# Patient Record
Sex: Female | Born: 1945 | Race: White | Hispanic: No | Marital: Married | Smoking: Former smoker
Health system: Southern US, Community
[De-identification: ages and names within clinical notes are randomized; demographics above are authoritative.]

## PROBLEM LIST (undated history)

## (undated) DIAGNOSIS — K219 Gastro-esophageal reflux disease without esophagitis: Secondary | ICD-10-CM

## (undated) DIAGNOSIS — M1990 Unspecified osteoarthritis, unspecified site: Secondary | ICD-10-CM

## (undated) DIAGNOSIS — E039 Hypothyroidism, unspecified: Secondary | ICD-10-CM

## (undated) DIAGNOSIS — K8050 Calculus of bile duct without cholangitis or cholecystitis without obstruction: Secondary | ICD-10-CM

## (undated) DIAGNOSIS — I10 Essential (primary) hypertension: Secondary | ICD-10-CM

## (undated) DIAGNOSIS — A048 Other specified bacterial intestinal infections: Secondary | ICD-10-CM

## (undated) DIAGNOSIS — D649 Anemia, unspecified: Secondary | ICD-10-CM

## (undated) DIAGNOSIS — H269 Unspecified cataract: Secondary | ICD-10-CM

## (undated) DIAGNOSIS — E785 Hyperlipidemia, unspecified: Secondary | ICD-10-CM

## (undated) DIAGNOSIS — M069 Rheumatoid arthritis, unspecified: Secondary | ICD-10-CM

## (undated) DIAGNOSIS — K279 Peptic ulcer, site unspecified, unspecified as acute or chronic, without hemorrhage or perforation: Secondary | ICD-10-CM

## (undated) DIAGNOSIS — R011 Cardiac murmur, unspecified: Secondary | ICD-10-CM

## (undated) HISTORY — DX: Peptic ulcer, site unspecified, unspecified as acute or chronic, without hemorrhage or perforation: K27.9

## (undated) HISTORY — DX: Other specified bacterial intestinal infections: A04.8

## (undated) HISTORY — PX: BREAST CYST EXCISION: SHX579

## (undated) HISTORY — PX: CARPAL TUNNEL RELEASE: SHX101

## (undated) HISTORY — PX: TRIGGER FINGER RELEASE: SHX641

## (undated) HISTORY — DX: Hypothyroidism, unspecified: E03.9

## (undated) HISTORY — DX: Anemia, unspecified: D64.9

## (undated) HISTORY — PX: PILONIDAL CYST EXCISION: SHX744

## (undated) HISTORY — PX: AV FISTULA REPAIR: SHX563

## (undated) HISTORY — DX: Unspecified cataract: H26.9

## (undated) HISTORY — PX: CYSTECTOMY: SUR359

## (undated) HISTORY — DX: Calculus of bile duct without cholangitis or cholecystitis without obstruction: K80.50

## (undated) HISTORY — DX: Rheumatoid arthritis, unspecified: M06.9

## (undated) HISTORY — DX: Gastro-esophageal reflux disease without esophagitis: K21.9

## (undated) HISTORY — DX: Hyperlipidemia, unspecified: E78.5

## (undated) HISTORY — PX: BLADDER SUSPENSION: SHX72

## (undated) HISTORY — PX: BUNIONECTOMY: SHX129

## (undated) HISTORY — DX: Unspecified osteoarthritis, unspecified site: M19.90

## (undated) HISTORY — PX: VAGINAL HYSTERECTOMY: SUR661

## (undated) HISTORY — DX: Cardiac murmur, unspecified: R01.1

## (undated) HISTORY — PX: TONSILLECTOMY: SUR1361

---

## 1998-11-14 ENCOUNTER — Encounter: Payer: Self-pay | Admitting: Internal Medicine

## 1998-11-14 ENCOUNTER — Ambulatory Visit (HOSPITAL_COMMUNITY): Admission: RE | Admit: 1998-11-14 | Discharge: 1998-11-14 | Payer: Self-pay | Admitting: Internal Medicine

## 1999-12-21 ENCOUNTER — Encounter: Payer: Self-pay | Admitting: Internal Medicine

## 1999-12-21 ENCOUNTER — Ambulatory Visit (HOSPITAL_COMMUNITY): Admission: RE | Admit: 1999-12-21 | Discharge: 1999-12-21 | Payer: Self-pay | Admitting: Internal Medicine

## 2000-03-25 ENCOUNTER — Encounter (INDEPENDENT_AMBULATORY_CARE_PROVIDER_SITE_OTHER): Payer: Self-pay | Admitting: Specialist

## 2000-03-25 ENCOUNTER — Ambulatory Visit (HOSPITAL_BASED_OUTPATIENT_CLINIC_OR_DEPARTMENT_OTHER): Admission: RE | Admit: 2000-03-25 | Discharge: 2000-03-25 | Payer: Self-pay | Admitting: Orthopedic Surgery

## 2000-12-23 ENCOUNTER — Ambulatory Visit (HOSPITAL_COMMUNITY): Admission: RE | Admit: 2000-12-23 | Discharge: 2000-12-23 | Payer: Self-pay | Admitting: Internal Medicine

## 2000-12-23 ENCOUNTER — Encounter: Payer: Self-pay | Admitting: Internal Medicine

## 2001-05-31 ENCOUNTER — Encounter: Payer: Self-pay | Admitting: Gastroenterology

## 2001-05-31 ENCOUNTER — Other Ambulatory Visit: Admission: RE | Admit: 2001-05-31 | Discharge: 2001-05-31 | Payer: Self-pay | Admitting: Gastroenterology

## 2001-05-31 ENCOUNTER — Encounter (INDEPENDENT_AMBULATORY_CARE_PROVIDER_SITE_OTHER): Payer: Self-pay | Admitting: Specialist

## 2001-09-26 ENCOUNTER — Encounter: Payer: Self-pay | Admitting: Internal Medicine

## 2001-09-26 ENCOUNTER — Ambulatory Visit (HOSPITAL_COMMUNITY): Admission: RE | Admit: 2001-09-26 | Discharge: 2001-09-26 | Payer: Self-pay | Admitting: Internal Medicine

## 2002-01-09 ENCOUNTER — Ambulatory Visit (HOSPITAL_COMMUNITY): Admission: RE | Admit: 2002-01-09 | Discharge: 2002-01-09 | Payer: Self-pay | Admitting: Internal Medicine

## 2002-01-09 ENCOUNTER — Encounter: Payer: Self-pay | Admitting: Internal Medicine

## 2002-05-03 ENCOUNTER — Encounter: Payer: Self-pay | Admitting: Urology

## 2002-05-07 ENCOUNTER — Observation Stay (HOSPITAL_COMMUNITY): Admission: RE | Admit: 2002-05-07 | Discharge: 2002-05-08 | Payer: Self-pay | Admitting: Urology

## 2003-07-19 ENCOUNTER — Encounter: Payer: Self-pay | Admitting: Internal Medicine

## 2003-07-19 ENCOUNTER — Ambulatory Visit (HOSPITAL_COMMUNITY): Admission: RE | Admit: 2003-07-19 | Discharge: 2003-07-19 | Payer: Self-pay | Admitting: Internal Medicine

## 2003-11-01 ENCOUNTER — Emergency Department (HOSPITAL_COMMUNITY): Admission: AD | Admit: 2003-11-01 | Discharge: 2003-11-01 | Payer: Self-pay | Admitting: Family Medicine

## 2003-11-08 ENCOUNTER — Emergency Department (HOSPITAL_COMMUNITY): Admission: AD | Admit: 2003-11-08 | Discharge: 2003-11-08 | Payer: Self-pay | Admitting: Family Medicine

## 2004-07-02 ENCOUNTER — Ambulatory Visit (HOSPITAL_BASED_OUTPATIENT_CLINIC_OR_DEPARTMENT_OTHER): Admission: RE | Admit: 2004-07-02 | Discharge: 2004-07-02 | Payer: Self-pay | Admitting: Urology

## 2004-07-02 ENCOUNTER — Ambulatory Visit (HOSPITAL_COMMUNITY): Admission: RE | Admit: 2004-07-02 | Discharge: 2004-07-02 | Payer: Self-pay | Admitting: Urology

## 2004-08-04 ENCOUNTER — Ambulatory Visit (HOSPITAL_COMMUNITY): Admission: RE | Admit: 2004-08-04 | Discharge: 2004-08-04 | Payer: Self-pay | Admitting: Internal Medicine

## 2004-12-18 ENCOUNTER — Encounter (INDEPENDENT_AMBULATORY_CARE_PROVIDER_SITE_OTHER): Payer: Self-pay | Admitting: *Deleted

## 2004-12-18 ENCOUNTER — Ambulatory Visit (HOSPITAL_BASED_OUTPATIENT_CLINIC_OR_DEPARTMENT_OTHER): Admission: RE | Admit: 2004-12-18 | Discharge: 2004-12-18 | Payer: Self-pay | Admitting: Orthopedic Surgery

## 2004-12-18 ENCOUNTER — Ambulatory Visit (HOSPITAL_COMMUNITY): Admission: RE | Admit: 2004-12-18 | Discharge: 2004-12-18 | Payer: Self-pay | Admitting: Orthopedic Surgery

## 2005-01-26 ENCOUNTER — Ambulatory Visit: Payer: Self-pay | Admitting: Gastroenterology

## 2005-02-10 ENCOUNTER — Ambulatory Visit: Payer: Self-pay | Admitting: Gastroenterology

## 2005-02-21 ENCOUNTER — Emergency Department (HOSPITAL_COMMUNITY): Admission: AD | Admit: 2005-02-21 | Discharge: 2005-02-21 | Payer: Self-pay | Admitting: Family Medicine

## 2005-02-21 ENCOUNTER — Ambulatory Visit (HOSPITAL_COMMUNITY): Admission: RE | Admit: 2005-02-21 | Discharge: 2005-02-21 | Payer: Self-pay | Admitting: Family Medicine

## 2005-03-19 ENCOUNTER — Ambulatory Visit: Payer: Self-pay | Admitting: Gastroenterology

## 2005-05-06 ENCOUNTER — Ambulatory Visit: Payer: Self-pay | Admitting: Gastroenterology

## 2005-08-26 ENCOUNTER — Ambulatory Visit (HOSPITAL_COMMUNITY): Admission: RE | Admit: 2005-08-26 | Discharge: 2005-08-26 | Payer: Self-pay | Admitting: Gynecology

## 2005-09-20 ENCOUNTER — Emergency Department (HOSPITAL_COMMUNITY): Admission: EM | Admit: 2005-09-20 | Discharge: 2005-09-20 | Payer: Self-pay | Admitting: Emergency Medicine

## 2006-10-28 ENCOUNTER — Ambulatory Visit (HOSPITAL_COMMUNITY): Admission: RE | Admit: 2006-10-28 | Discharge: 2006-10-28 | Payer: Self-pay | Admitting: Internal Medicine

## 2007-11-14 ENCOUNTER — Ambulatory Visit (HOSPITAL_COMMUNITY): Admission: RE | Admit: 2007-11-14 | Discharge: 2007-11-14 | Payer: Self-pay | Admitting: Internal Medicine

## 2008-09-18 ENCOUNTER — Encounter: Payer: Self-pay | Admitting: Gastroenterology

## 2008-09-24 ENCOUNTER — Encounter: Payer: Self-pay | Admitting: Gastroenterology

## 2008-09-25 ENCOUNTER — Encounter: Payer: Self-pay | Admitting: Gastroenterology

## 2008-10-21 DIAGNOSIS — M069 Rheumatoid arthritis, unspecified: Secondary | ICD-10-CM

## 2008-10-21 DIAGNOSIS — I10 Essential (primary) hypertension: Secondary | ICD-10-CM

## 2008-10-21 DIAGNOSIS — Z8711 Personal history of peptic ulcer disease: Secondary | ICD-10-CM

## 2008-10-21 DIAGNOSIS — K253 Acute gastric ulcer without hemorrhage or perforation: Secondary | ICD-10-CM | POA: Insufficient documentation

## 2008-10-28 ENCOUNTER — Ambulatory Visit: Payer: Self-pay | Admitting: Gastroenterology

## 2008-11-12 ENCOUNTER — Encounter: Payer: Self-pay | Admitting: Gastroenterology

## 2008-11-12 ENCOUNTER — Ambulatory Visit: Payer: Self-pay | Admitting: Gastroenterology

## 2008-11-15 ENCOUNTER — Ambulatory Visit (HOSPITAL_COMMUNITY): Admission: RE | Admit: 2008-11-15 | Discharge: 2008-11-15 | Payer: Self-pay | Admitting: Internal Medicine

## 2008-11-15 ENCOUNTER — Telehealth: Payer: Self-pay | Admitting: Gastroenterology

## 2008-11-15 ENCOUNTER — Encounter: Payer: Self-pay | Admitting: Gastroenterology

## 2008-11-19 ENCOUNTER — Ambulatory Visit (HOSPITAL_BASED_OUTPATIENT_CLINIC_OR_DEPARTMENT_OTHER): Admission: RE | Admit: 2008-11-19 | Discharge: 2008-11-19 | Payer: Self-pay | Admitting: Orthopedic Surgery

## 2008-12-09 ENCOUNTER — Ambulatory Visit: Payer: Self-pay | Admitting: Gastroenterology

## 2008-12-09 LAB — CONVERTED CEMR LAB
Basophils Absolute: 0.1 10*3/uL (ref 0.0–0.1)
Basophils Relative: 1.5 % (ref 0.0–3.0)
Eosinophils Absolute: 0.1 10*3/uL (ref 0.0–0.7)
Eosinophils Relative: 3.5 % (ref 0.0–5.0)
HCT: 35.4 % — ABNORMAL LOW (ref 36.0–46.0)
Hemoglobin: 12.2 g/dL (ref 12.0–15.0)
Lymphocytes Relative: 31.6 % (ref 12.0–46.0)
MCHC: 34.5 g/dL (ref 30.0–36.0)
MCV: 88.8 fL (ref 78.0–100.0)
Monocytes Absolute: 0.4 10*3/uL (ref 0.1–1.0)
Monocytes Relative: 8.9 % (ref 3.0–12.0)
Neutro Abs: 2.2 10*3/uL (ref 1.4–7.7)
Neutrophils Relative %: 54.5 % (ref 43.0–77.0)
Platelets: 162 10*3/uL (ref 150–400)
RBC: 3.99 M/uL (ref 3.87–5.11)
RDW: 14.1 % (ref 11.5–14.6)
WBC: 4.1 10*3/uL — ABNORMAL LOW (ref 4.5–10.5)

## 2008-12-11 ENCOUNTER — Ambulatory Visit: Payer: Self-pay | Admitting: Gastroenterology

## 2009-03-17 ENCOUNTER — Encounter: Payer: Self-pay | Admitting: Gastroenterology

## 2009-09-24 ENCOUNTER — Ambulatory Visit (HOSPITAL_COMMUNITY): Admission: RE | Admit: 2009-09-24 | Discharge: 2009-09-24 | Payer: Self-pay | Admitting: Internal Medicine

## 2009-11-19 ENCOUNTER — Ambulatory Visit (HOSPITAL_COMMUNITY): Admission: RE | Admit: 2009-11-19 | Discharge: 2009-11-19 | Payer: Self-pay | Admitting: Internal Medicine

## 2010-08-18 ENCOUNTER — Encounter: Payer: Self-pay | Admitting: Gastroenterology

## 2010-09-30 ENCOUNTER — Ambulatory Visit: Payer: Self-pay | Admitting: Gastroenterology

## 2010-11-11 ENCOUNTER — Ambulatory Visit
Admission: RE | Admit: 2010-11-11 | Discharge: 2010-11-11 | Payer: Self-pay | Source: Home / Self Care | Attending: Gastroenterology | Admitting: Gastroenterology

## 2010-11-11 ENCOUNTER — Encounter: Payer: Self-pay | Admitting: Gastroenterology

## 2010-11-12 ENCOUNTER — Encounter: Payer: Self-pay | Admitting: Gastroenterology

## 2010-11-24 ENCOUNTER — Encounter: Payer: Self-pay | Admitting: Gastroenterology

## 2010-11-24 ENCOUNTER — Other Ambulatory Visit: Payer: Self-pay | Admitting: Gastroenterology

## 2010-11-24 ENCOUNTER — Ambulatory Visit
Admission: RE | Admit: 2010-11-24 | Discharge: 2010-11-24 | Payer: Self-pay | Source: Home / Self Care | Attending: Gastroenterology | Admitting: Gastroenterology

## 2010-11-24 ENCOUNTER — Ambulatory Visit (HOSPITAL_COMMUNITY)
Admission: RE | Admit: 2010-11-24 | Discharge: 2010-11-24 | Payer: Self-pay | Source: Home / Self Care | Attending: Internal Medicine | Admitting: Internal Medicine

## 2010-11-24 LAB — HEMOCCULT SLIDES (X 3 CARDS)
Fecal Occult Blood: NEGATIVE
OCCULT 1: NEGATIVE
OCCULT 2: NEGATIVE
OCCULT 3: NEGATIVE
OCCULT 4: NEGATIVE
OCCULT 5: NEGATIVE

## 2010-11-26 ENCOUNTER — Ambulatory Visit: Admit: 2010-11-26 | Payer: Self-pay | Admitting: Gastroenterology

## 2010-11-28 ENCOUNTER — Encounter: Payer: Self-pay | Admitting: Internal Medicine

## 2010-11-29 ENCOUNTER — Encounter: Payer: Self-pay | Admitting: Internal Medicine

## 2010-12-10 NOTE — Assessment & Plan Note (Signed)
Summary: LOW IRON/YF   History of Present Illness Visit Type: Follow-up Visit Primary GI MD: Melvia Heaps MD [facility] Primary Provider: Lucky Cowboy, MD Requesting Provider: Lucky Cowboy, MD Chief Complaint: iron def anemia History of Present Illness:   Ms.  Vazquez has returned for reevaluation of an iron deficiency anemia.  She was last seen in February, 2010 where ulcers were seen at endoscopy.  She had been taking ibuprofen regularly for her rheumatoid arthritis.  She was referred back for reevaluation.  Recent hemoglobin was 13.3.  Ferritin level was 40.  She takes folate and a multivitamin presumably containing iron .  She is taking ibuprofen 800 mg 2-4 times a day and prilosec concurrently.  She denies abdominal pain, melena or hematochezia.  Last colonoscopy was in 2002 and was normal.   GI Review of Systems    Reports abdominal pain and  acid reflux.     Location of  Abdominal pain: epigastric area.    Denies belching, bloating, chest pain, dysphagia with liquids, dysphagia with solids, heartburn, loss of appetite, nausea, vomiting, vomiting blood, weight loss, and  weight gain.        Denies anal fissure, black tarry stools, change in bowel habit, constipation, diarrhea, diverticulosis, fecal incontinence, heme positive stool, hemorrhoids, irritable bowel syndrome, jaundice, light color stool, liver problems, rectal bleeding, and  rectal pain.    Current Medications (verified): 1)  Plaquenil 200 Mg Tabs (Hydroxychloroquine Sulfate) .Marland Kitchen.. 1 Tablet By Mouth Once Daily 2)  Methotrexate 2.5 Mg Tabs (Methotrexate Sodium) .... 4 Tablets By Mouth Weekly 3)  Folic Acid 1 Mg Tabs (Folic Acid) .Marland Kitchen.. 1 Tablet By Mouth Once Daily 4)  Synthroid 175 Mcg Tabs (Levothyroxine Sodium) .Marland Kitchen.. 1 Tablet By Mouth Once Daily 5)  Alprazolam 0.5 Mg Tabs (Alprazolam) .Marland Kitchen.. 1 Tablet By Mouth At Bedtime As Needed Sleep 6)  Ibuprofen 800 Mg Tabs (Ibuprofen) .... 2-4 Tablets By Mouth Once Daily 7)   Multivitamins  Tabs (Multiple Vitamin) .Marland Kitchen.. 1 Tablet By Mouth Once Daily 8)  Vitamin D 2000 Unit Tabs (Cholecalciferol) .... 4 Tablets By Mouth Once Daily 9)  Prilosec 20 Mg Cpdr (Omeprazole) .... Take By Mouth Once Daily Every Am 30 Minutes Before First Meal 10)  Cyclobenzaprine Hcl 10 Mg Tabs (Cyclobenzaprine Hcl) .... As Needed 11)  Multivitamins  Tabs (Multiple Vitamin) .... Once Daily 12)  Osteo Bi-Flex Adv Joint Shield  Tabs (Misc Natural Products) .... Take 2 Tablets Daily 13)  Coq-10 30 Mg Caps (Coenzyme Q10) .... Take 2 Capsules Daily 14)  Red Yeast Rice Extract 600 Mg Caps (Red Yeast Rice Extract) .... Take 2 Capsules Daily 15)  Fish Oil 1000 Mg Caps (Omega-3 Fatty Acids) .... Once Daily 16)  Advil 200 Mg Tabs (Ibuprofen) .... As Needed 17)  Aspirin 81 Mg Tbec (Aspirin) .... Once Daily  Allergies (verified): No Known Drug Allergies  Past History:  Past Medical History: Reviewed history from 10/21/2008 and no changes required. Current Problems:  HELICOBACTER PYLORI INFECTION, HX OF (ICD-V12.71) ARTHRITIS, RHEUMATOID (ICD-714.0) HYPERTENSION (ICD-401.9) GASTRIC ULCER, ACUTE (ICD-531.30)    Past Surgical History: Reviewed history from 10/21/2008 and no changes required. Breast Biopsy Hysterectomy Tonsillectomy Repair of right fourth hammer toe deformity  Family History: Reviewed history from 10/28/2008 and no changes required. Family History of Stomach Cancer:Father Family History of Heart Disease: Father, Mother Family History of Breast Cancer:aunt Family History of Ovarian Cancer:aunt Family History of Prostate Cancer:paternal grandfather Family History of Colon Cancer:maternal grandfather  Social History: Reviewed history from 10/28/2008  and no changes required. Patient is a former smoker.  Alcohol Use - yes Occupation: Retired  Review of Systems       The patient complains of arthritis/joint pain, back pain, sleeping problems, and urine leakage.  The  patient denies allergy/sinus, anemia, anxiety-new, blood in urine, breast changes/lumps, change in vision, confusion, cough, coughing up blood, depression-new, fainting, fatigue, fever, headaches-new, hearing problems, heart murmur, heart rhythm changes, itching, menstrual pain, muscle pains/cramps, night sweats, nosebleeds, pregnancy symptoms, shortness of breath, skin rash, sore throat, swelling of feet/legs, swollen lymph glands, thirst - excessive , urination - excessive , urination changes/pain, vision changes, and voice change.    Vital Signs:  Patient profile:   65 year old female Height:      63 inches Weight:      178 pounds BMI:     31.65 Pulse rate:   60 / minute Pulse rhythm:   regular BP sitting:   134 / 88  (left arm) Cuff size:   regular  Vitals Entered By: June McMurray CMA Duncan Dull) (September 30, 2010 11:27 AM)  Physical Exam  Additional Exam:  On physical exam she is a well developed well nourished female  skin: anicteric HEENT: normocephalic; PEERLA; no nasal or pharyngeal abnormalities neck: supple nodes: no cervical lymphadenopathy chest: clear to ausculatation and percussion heart: no murmurs, gallops, or rubs abd: soft, nontender; BS normoactive; no abdominal masses, tenderness, organomegaly rectal: deferred ext: no cynanosis, clubbing, edema skeletal: no deformities neuro: oriented x 3; no focal abnormalities    Impression & Recommendations:  Problem # 1:  ANEMIA OF OTHER CHRONIC DISEASE (ICD-285.29)  The patient has a history of an iron deficiency anemia and anemia of chronic disease.  With her history of NSAID-related ulcers, recurrent ulcer disease should be ruled out.  Recommendations #1 upper endoscopy #2 to consider colonoscopy if upper endoscopy is unrevealing  Orders: EGD (EGD)  Problem # 2:  ARTHRITIS, RHEUMATOID (ICD-714.0) Assessment: Comment Only  Patient Instructions: 1)  Copy sent to : Lucky Cowboy, MD 2)  Your EGD is scheduled  on 1/4/2012at 2pm 3)  The medication list was reviewed and reconciled.  All changed / newly prescribed medications were explained.  A complete medication list was provided to the patient / caregiver. 4)  Conscious Sedation brochure given.  5)  Upper Endoscopy brochure given.

## 2010-12-10 NOTE — Letter (Signed)
Summary: CMA Hemoccult Letter  [facility]  [address] [place], [place] [address]   Phone: [phone]  Fax: [phone]         November 12, 2010 MRN: [id number]    [facility] [address] [place], [place]  [address]    Dear Ms. Fly,     At your last endoscopy, Dr.Emilyn Ruble requested that you complete these hemoccult cards. Please follow the instructions on the inside cover and return them as soon as possible.If you have misplaced the hemoccult cards, please call me at [phone] and I will mail you new cards. Your health is very important to [place].These tests will help ensure that Dr. Arlyce Dice has all the information at his disposal to make a complete diagnosis for you.  Thank you for your prompt attention to this matter.   Sincerely,    Selinda Michaels RN

## 2010-12-10 NOTE — Letter (Signed)
Summary: EGD Instructions  [facility]  [address] [place], [place] [address]   Phone: [phone]  Fax: [phone]       [name]    1946-02-04    MRN: [id number]       Procedure Day /Date:WEDNESDAY 11/11/2010     Arrival Time: 1PM     Procedure Time:2PM     Location of Procedure:                    X  [facility] (4th Floor)   PREPARATION FOR ENDOSCOPY   On1/02/2011 THE DAY OF THE PROCEDURE:  1.   No solid foods, milk or milk products are allowed after midnight the night before your procedure.  2.   Do not drink anything colored red or purple.  Avoid juices with pulp.  No orange juice.  3.  You may drink clear liquids until12PM , which is 2 hours before your procedure.                                                                                                CLEAR LIQUIDS INCLUDE: Water Jello Ice Popsicles Tea (sugar ok, no milk/cream) Powdered fruit flavored drinks Coffee (sugar ok, no milk/cream) Gatorade Juice: apple, white grape, white cranberry  Lemonade Clear bullion, consomm, broth Carbonated beverages (any kind) Strained chicken noodle soup Hard Candy   MEDICATION INSTRUCTIONS  Unless otherwise instructed, you should take regular prescription medications with a small sip of water as early as possible the morning of your procedure.           OTHER INSTRUCTIONS  You will need a responsible adult at least 64 years of age to accompany you and drive you home.   This person must remain in the waiting room during your procedure.  Wear loose fitting clothing that is easily removed.  Leave jewelry and other valuables at home.  However, you may wish to bring a book to read or an iPod/MP3 player to listen to music as you wait for your procedure to start.  Remove all body piercing jewelry and leave at home.  Total time from sign-in until discharge is approximately 2-3 hours.  You should go home directly after  your procedure and rest.  You can resume normal activities the day after your procedure.  The day of your procedure you should not:   Drive   Make legal decisions   Operate machinery   Drink alcohol   Return to work  You will receive specific instructions about eating, activities and medications before you leave.    The above instructions have been reviewed and explained to me by   _______________________    I fully understand and can verbalize these instructions _____________________________ Date _________

## 2010-12-10 NOTE — Letter (Signed)
Summary: Results Letter  [facility]  [address] [place], [place] [address]   Phone: [phone]  Fax: [phone]        November 24, 2010 MRN: [id number]    [facility] [address] [place], [place]  [address]    Dear Ms. Gonzalez,  Your hemeoccult cards testing for microscopic bleeding were negative.  No further GI workup is required at this time.  Please continue with the recommendations that we previously discussed. Should you have any further questions or concerns, feel free to contact me.    Sincerely,  Barbette Hair. Arlyce Dice, M.D., [facility]          Sincerely,  Louis Meckel MD  This letter has been electronically signed by your physician.  Appended Document: Results Letter letter mailed

## 2010-12-10 NOTE — Procedures (Signed)
Summary: Upper Endoscopy  Patient: [place] Landgrebe Note: All result statuses are Final unless otherwise noted.  Tests: (1) Upper Endoscopy (EGD)   EGD Upper Endoscopy       DONE     [facility]     [address]     [place], [place]  [address]           ENDOSCOPY PROCEDURE REPORT           PATIENT:  Zuriel, Yeaman  MR#:  [id number]     BIRTHDATE:  May 28, 1946, 64 yrs. old  GENDER:  female           ENDOSCOPIST:  Barbette Hair. Arlyce Dice, MD     Referred by:  Lucky Cowboy, M.D.           PROCEDURE DATE:  11/11/2010     PROCEDURE:  EGD, diagnostic 43235     ASA CLASS:  Class II     INDICATIONS:  anemia           MEDICATIONS:   Fentanyl 100 mcg IV, Versed 10 mg IV,     glycopyrrolate (Robinal) 0.2 mg IV, 0.6cc simethancone 0.6 cc PO     TOPICAL ANESTHETIC:  Exactacain Spray           DESCRIPTION OF PROCEDURE:   After the risks benefits and     alternatives of the procedure were thoroughly explained, informed     consent was obtained.  The LB GIF-H180 [id number] endoscope was     introduced through the mouth and advanced to the third portion of     the duodenum, without limitations.  The instrument was slowly     withdrawn as the mucosa was fully examined.     <<PROCEDUREIMAGES>>           Mild gastritis was found in the antrum (see image2). Mild     nonerosive gastritis consisting of erythema and minimal edema     Otherwise the examination was normal.    Retroflexed views revealed     no abnormalities.    The scope was then withdrawn from the patient     and the procedure completed.           COMPLICATIONS:  None           ENDOSCOPIC IMPRESSION:     1) Mild gastritis in the antrum     2) Otherwise normal examination     RECOMMENDATIONS:     1) hemmoccult stools; if positive proceed with colonoscopy           REPEAT EXAM:  No           ______________________________     Barbette Hair. Arlyce Dice, MD           CC:           n.     eSIGNED:   Barbette Hair. Kaplan at  11/11/2010 02:06 PM           Deatra James, [id number]  Note: An exclamation mark (!) indicates a result that was not dispersed into the flowsheet. Document Creation Date: 11/11/2010 2:07 PM _______________________________________________________________________  (1) Order result status: Final Collection or observation date-time: 11/11/2010 14:02 Requested date-time:  Receipt date-time:  Reported date-time:  Referring Physician:   Ordering Physician: Melvia Heaps [id number]) Specimen Source:  Source: Launa Grill Order Number: [id number] Lab site:

## 2011-02-15 ENCOUNTER — Encounter (HOSPITAL_BASED_OUTPATIENT_CLINIC_OR_DEPARTMENT_OTHER)
Admission: RE | Admit: 2011-02-15 | Discharge: 2011-02-15 | Disposition: A | Discharge status: Home / Self Care | Payer: 59 | Source: Ambulatory Visit | Attending: Orthopedic Surgery | Admitting: Orthopedic Surgery

## 2011-02-15 LAB — BASIC METABOLIC PANEL
BUN: 6 mg/dL (ref 6–23)
CO2: 27 mEq/L (ref 19–32)
Calcium: 9.8 mg/dL (ref 8.4–10.5)
Chloride: 103 mEq/L (ref 96–112)
GFR calc Af Amer: >60 mL/min (ref >60)
GFR calc non Af Amer: >60 mL/min (ref >60)
Glucose, Bld: 110 mg/dL — ABNORMAL HIGH (ref 70–99)
Potassium: 4 mEq/L (ref 3.5–5.1)
Sodium: 135 mEq/L (ref 135–145)

## 2011-02-17 ENCOUNTER — Ambulatory Visit (HOSPITAL_BASED_OUTPATIENT_CLINIC_OR_DEPARTMENT_OTHER)
Admission: RE | Admit: 2011-02-17 | Discharge: 2011-02-17 | Disposition: A | Discharge status: Home / Self Care | Payer: 59 | Source: Ambulatory Visit | Attending: Orthopedic Surgery | Admitting: Orthopedic Surgery

## 2011-02-17 ENCOUNTER — Other Ambulatory Visit: Payer: Self-pay | Admitting: Orthopedic Surgery

## 2011-02-17 DIAGNOSIS — M6530 Trigger finger, unspecified finger: Secondary | ICD-10-CM | POA: Insufficient documentation

## 2011-02-17 DIAGNOSIS — Z01812 Encounter for preprocedural laboratory examination: Secondary | ICD-10-CM | POA: Insufficient documentation

## 2011-02-17 DIAGNOSIS — M069 Rheumatoid arthritis, unspecified: Secondary | ICD-10-CM | POA: Insufficient documentation

## 2011-02-17 DIAGNOSIS — M799 Soft tissue disorder, unspecified: Secondary | ICD-10-CM | POA: Insufficient documentation

## 2011-02-22 LAB — POCT HEMOGLOBIN-HEMACUE: Hemoglobin: 12.9 g/dL (ref 12.0–15.0)

## 2011-03-23 NOTE — Op Note (Signed)
[name], [name]            ACCOUNT NO.:  [id number]   MEDICAL RECORD NO.:  [id number]          PATIENT TYPE:  OUT   LOCATION:  [facility]                          FACILITY:  [facility]   PHYSICIAN:  Cindee Salt, M.D.       DATE OF BIRTH:  01-02-1946   DATE OF PROCEDURE:  11/19/2008  DATE OF DISCHARGE:  11/15/2008                               OPERATIVE REPORT   PREOPERATIVE DIAGNOSIS:  Stenosing tenosynovitis, left thumb.   POSTOPERATIVE DIAGNOSIS:  Stenosing tenosynovitis, left thumb.   OPERATION:  A1 pulley, left thumb.   SURGEON:  Cindee Salt, MD   ASSISTANT:  Carolyne Fiscal RN   ANESTHESIA:  Forearm based IV regional with local infiltration.   ANESTHESIOLOGIST:  Burna Forts, MD   HISTORY:  The patient is a 65 year old female with a history of  triggering of the left thumb.  This did not responded to conservative  treatment.  She has elected to undergo surgical release.  Her  postoperative course was discussed along with risks and complications.  She is aware that there is no guarantee with surgery, possibility of  infection, recurrence injury to arteries, nerves, tendons, incomplete  relief of symptoms, and dystrophy.  In the preoperative area, the  patient is seen.  The extremity marked by both the patient and surgeon.  Antibiotic given.   PROCEDURE:  The patient was brought to the operating room where forearm-  based IV regional anesthetic carried out without difficulty.  She was  prepped using DuraPrep, supine position, left arm free.  A time-out was  taken.  A transverse incision was made over the A1 pulley of the left  thumb, carried down through subcutaneous tissue.  Bleeders were  electrocauterized.  The area was then locally infiltrated with 1%  Xylocaine without epinephrine, 1 mL was used.  The A1 pulley was  identified.  This was found to be extremely tight around the FPL tendon.  A release was performed on the radial aspect.  The oblique pulley was  left intact.  Thumb  placed through a full range of motion.  No further  triggering was identified.  The wound was copiously irrigated with  saline.  The skin closed with interrupted 5-0 Vicryl Rapide sutures.  A  sterile compressive dressing was applied to the fingers.  On deflation  of the tourniquet, all fingers immediately pinked.  She was taken to the  recovery room for observation in satisfactory condition.  She will be  discharged home.  To return to the [facility] [place] in 1 week  on Vicodin.           ______________________________  Cindee Salt, M.D.     GK/MEDQ  D:  11/19/2008  T:  11/19/2008  Job:  [id number]

## 2011-03-26 NOTE — Op Note (Signed)
[place]  Patient:    [name], [name]                   MRN: [id number] Proc. Date: 03/25/00 Adm. Date:  [id number] Disc. Date: [id number] Attending:  Ronne Binning Dictator:   Nicki Reaper, M.D. CC:         Nicki Reaper, M.D. 2 copies             Demetria Pore. Coral Spikes, M.D.                           Operative Report  PREOPERATIVE DIAGNOSIS:  Carpal tunnel syndrome with rheumatoid arthritis.  POSTOPERATIVE DIAGNOSIS:  Carpal tunnel syndrome with rheumatoid arthritis.  PROCEDURE:  Decompression of right median nerve with tenosynovectomy of flexor tendons, right hand.  SURGEON:  Dr. Merlyn Lot.  ANESTHESIA:  Axillary block.  ANESTHESIOLOGIST:  Janetta Hora. Gelene Mink, M.D.  HISTORY OF PRESENT ILLNESS:  The patient is a 65 year old female with a history of rheumatoid arthritis, carpal tunnel syndrome.  EMG nerve conduction is positive which has not responded to conservative treatment.  DESCRIPTION OF PROCEDURE:  The patient was brought to the operating room where an axillary block was carried out without difficulty.  She was prepped and draped using Betadine scrubbing solution.  The right arm free, the limb was exsanguinated with an Esmarch bandage.  Tourniquet placed high, and the arm was inflated to 250 mmHg.  A longitudinal incision was made in the palm, carried to the ulnar side of the wrist and down onto the distal forearm the volar aspect, carried down through subcutaneous tissues.  Bleeders were electrocauterized.  The median nerve was identified and protected along with the ulnar nerve.  The carpal retinaculum forearm fascia was released over its entire aspect.  The nerve was identified, protected, an area of compression was apparent.  The tenosynovial tissue was moderately thickened, adherent to the flexor tendons, with some invasion to the profundus tendon.  This was removed in total with rongeurs and sharp blunt dissection, including  the FPL, the FDP, and the FDS to each fingers.  Fingers placed through a full range of motion.  No triggering was identified.  The wound was copiously irrigated with saline.  Bleeders again electrocauterized.  The entire procedure taking care to protect the median nerve throughout the procedure.  The skin was closed with interrupted 5-0 Nylon sutures.  Sterile compressive dressing and splint was applied.  The patient tolerated the procedure well, and was taken to the recovery room for observation in satisfactory condition.  She is discharged home to return to the [facility] [place] in one week on Vicodin and Keflex.DD:  03/25/00 TD:  03/30/00 Job: [id number] UXL/KG401

## 2011-03-26 NOTE — Op Note (Signed)
NAME:  [name], [name]                      ACCOUNT NO.:  [id number]   MEDICAL RECORD NO.:  [id number]                   PATIENT TYPE:  AMB   LOCATION:  [facility]                                 FACILITY:  [facility]   PHYSICIAN:  Sigmund I. Patsi Sears, M.D.         DATE OF BIRTH:  1946-10-24   DATE OF PROCEDURE:  07/02/2004  DATE OF DISCHARGE:                                 OPERATIVE REPORT   PREOPERATIVE DIAGNOSIS:  Transurethral SPARC suture erosion.   POSTOPERATIVE DIAGNOSIS:  Transurethral SPARC suture erosion.   OPERATION:  Excision of vaginal scar.   SURGEON:  Sigmund I. Patsi Sears, M.D.   ANESTHESIA:  General LMA.   OPERATION:  After appropriate preanesthesia, the patient is brought to the  operating room and placed on the operating room table in a dorsal supine  position where general LMA anesthesia was induced.  The Foley catheter was  placed.  In the lithotomy position, the speculum was placed.  Vaginal  inspection revealed a normal-appearing vagina.  The patient did have some  scar tissue at the level of the previously placed [facility] transvaginal repair.  I did not see any evidence of SPARC extrusion.  I elected to excise  in a  horizontal elliptical fashion, the second scar.  This was accomplished with  undermining of the edges and closure in a single layer with 2-0 Vicryl  suture.  There was minimal bleeding noted.  The remaining vagina is  inspected carefully.  Only a single area of scar is identified at the left  posterior fornix from previous surgery.  I do not believe that it is related  to any sexual activity discomfort.  Following this, the patient was given IV  Toradol, awakened, and taken to the recovery room in good condition.                                               Sigmund I. Patsi Sears, M.D.    SIT/MEDQ  D:  07/02/2004  T:  07/02/2004  Job:  [id number]

## 2011-03-26 NOTE — Op Note (Signed)
[name], [name]            ACCOUNT NO.:  [id number]   MEDICAL RECORD NO.:  [id number]          PATIENT TYPE:  AMB   LOCATION:  [facility]                          FACILITY:  [facility]   PHYSICIAN:  [name], M.D.       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   OPERATION:  Tenosynovectomy, right middle and right ring finger flexor  tendons, with excision of ulnar slip, ring finger superficialis, with  infiltration in nodule.   SURGEON:  [name], M.D.   ASSISTANT:  [name].   ANESTHESIA:  IV regional.   HISTORY:  The patient is a 65 year old female with a history of rheumatoid  arthritis who has done extremely well, but she has developed triggering of  her middle and ring fingers, more significant on the ring finger of her  right hand.   PROCEDURE:  The patient was brought to the operating room where a forearm-  based IV regional anesthetic was carried out without difficulty. She was  prepped using DuraPrep, supine position, right arm free. A volar Brunner  incision was made over the middle finger, carried down through subcutaneous  tissue. Bleeders were electrocauterized. Neurovascular structures were  protected. A moderate tenosynovitis was present. A flexor sheath cyst was  present over the midportion the A2 pulley; this was excised. A  tenosynovectomy was then performed to the level of the PIP joint; looking  distally under the skin, no further significant synovitis was present  distally. The finger was placed through full range motion; no further  triggering was evident. Traction on the superficialis profundus in unison  and independently showed no further triggering. A  separate incision was then  made similar the middle finger on the ring finger, carried down through  subcutaneous tissue. Bleeders were again electrocauterized, neurovascular  structures protected. A very significant synovitis was present about the  synovial tissue of both tendons; this was removed with a rongeur; specimen  was sent to pathology. This was performed out to the level of the A4 pulley,  removing the entire synovium. On flexion, a significant triggering was still  present. It was determined that there was a nodule present in the ulnar limb  of the superficialis distally and this was released. The limb was then  pulled distally through the a C3 pulley. Flexion/extension in the traction  on the remaining 2 flexor tendons revealed complete resolution. A nodule was  present with infiltration of the tissue into a nodule on the ulnar limb of  the superficialis was noted; this area was excised, the remainder of the  limb left outside to the A2 pulley. The portion of flexor tendon with the  nodule present was sent to pathology. The wounds were irrigated. A Vesseloop  drain was placed to the base of the wounds. The wounds were then closed  interrupted 5-0 nylon sutures. A sterile compressive  dressing and splint were applied.  On deflation the tourniquet, all fingers  immediately pinked. She was taken to the recovery room for observation in  satisfactory condition.   She will be discharged home to return to the [facility] [place] in 1  week on Vicodin.      GK/MEDQ  D:  12/18/2004  T:  12/19/2004  Job:  [id number]   cc:   Demetria Pore. Coral Spikes, M.D.  [address]  [address]  [place]  [place] [address]  Fax: [phone]

## 2011-03-26 NOTE — Op Note (Signed)
[facility]  Patient:    [name], [name] Visit Number: [id number] MRN: [id number]          Service Type: SUR Location: [id number] Attending Physician:  Laqueta Jean Dictated by:   Vonzell Schlatter Patsi Sears, M.D. Proc. Date: 05/07/02 Admit Date:  05/07/2002 Discharge Date: 05/08/2002   CC:         Marinus Maw, M.D.   Operative Report  PREOPERATIVE DIAGNOSIS:  Pelvic prolapse with stress urinary incontinence.  POSTOPERATIVE DIAGNOSIS:  Pelvic prolapse with stress urinary incontinence.  OPERATION PERFORMED:  SPARC pubovaginal sling, anterior vaginal vault repair, rectocele repair.  SURGEON:  Sigmund I. Patsi Sears, M.D.  ANESTHESIA:  General endotracheal.  PREPARATION:  After appropriate preanesthesia, the patient was brought to the operating room, placed on the operating table in the dorsal supine position where general endotracheal anesthesia was introduced. She was then replaced in the low Allen stirrup dorsal lithotomy position where the pubis was prepped with Betadine solution and draped in the usual fashion.  REVIEW OF HISTORY:  This 65 year old female has a history of cough, laugh and sneeze incontinence, with physical examination showing positive Marshall test, positive Q-tip test, with a history of a hysterectomy in 1980, rheumatoid arthritis, thyroid disease, hypertension, and GERD. Urodynamics from 1998 showed a large capacity bladder with elevated PVR of 125 cc, ______ of 13 cc. The patient is now for vaginal vault repair.  DESCRIPTION OF PROCEDURE:  The 3 cc of Marcaine 0.5% with epinephrine 1:200,000 was injected into the periurethral pubovaginal cervical tissue. The additional 8 cc is injected into the vaginal vault more proximal, at the level of the bladder.  Two separate incisions are made, one measuring 2 cm at the level of the urethra, and a second measuring 8 cm at the level of the bladder  more proximalward. The two incisions were separated by approximately 3 mm of tissue. Each area was then dissected with sharp and blunt dissection. Two separate incisions are then made in the suprapubic area, two finger breadths above the pubic tubercle, lateralward. The [facility] needles were then placed bilaterally, and cystoscopy was accomplished with shows no evidence of any needle within the bladder. Following this, the [facility] sling was placed in standard fashion, cut in standard fashion and the plastic sleeves removed. A Wrangler clamp was placed at the level of the bladder neck during the entire procedure to ensure that it was not too tight.  The [facility] was then cut below the skin on either side. It was then elected to placed a portion of tutoplast fascia, over the [facility] sling, this was accomplished, the [facility] sling was then closed in a single layer with 3-0 Vicryl suture. Following this, the incision in the subcutaneous tissue was dissected for the anterior vaginal vault repair, this was accomplished, and the remaining SPARC tissue was placed after horizontal mattress plication sutures were placed of 2-0 Vicryl suture. Following this, placement of the fascia was accomplished, and then closure of the vaginal vault was accomplished with two layers of 3-0 Vicryl suture. Following this, repeat cystoscopy was accomplished and there was no evidence of any sling within the bladder. A Foley catheter was again placed, bladder draining fluid, and attention directed to the rectocele repair. Two Allis clamps were placed at the 5 and 7 oclock positions and horizontal incisions made, subcutaneous tissue dissected proximalward incising the posterior vaginal mucosa. This was sharply and bluntly dissected with no injury to the rectum. A piece of tutoplast fascia was then  placed and the rectocele was closed over the tutoplast fascia, with horizontal mattress 3-0 Vicryl sutures. The mucosa was then closed  with running 3-0 Vicryl suture. No bleeding was noted. The vaginal vault was then packed with Estrace cream and vaginal packing. The patient was awakened and taken to the recovery room in good condition. She was given IV Toradol. Dictated by:   Vonzell Schlatter Patsi Sears, M.D. Attending Physician:  Laqueta Jean DD:  05/07/02 TD:  05/09/02 Job: [id number]

## 2011-05-28 NOTE — Op Note (Signed)
[name], [name]            ACCOUNT NO.:  [id number]  MEDICAL RECORD NO.:  [id number]           PATIENT TYPE:  LOCATION:                                 FACILITY:  PHYSICIAN:  Cindee Salt, M.D.       DATE OF BIRTH:  [birth year]  DATE OF PROCEDURE:  02/17/2011 DATE OF DISCHARGE:                              OPERATIVE REPORT   PREOPERATIVE DIAGNOSIS:  Rheumatoid arthritis with triggering of right index, right middle finger.  POSTOPERATIVE DIAGNOSIS:  Rheumatoid arthritis with triggering of right index, right middle finger.  OPERATION:  Tenosynovectomy right index, right middle fingers with excision ulnar slip superficialis right middle finger.  SURGEON:  Cindee Salt, MD  ASSISTANT:  None.  ANESTHESIA:  Axillary block.  ANESTHESIOLOGIST:  Janetta Hora. Gelene Mink, MD  HISTORY:  The patient is a 65 year old female with a history of rheumatoid arthritis who developed triggering of middle, right index fingers.  This has not responded to conservative treatment.  She has elected to undergo exploration with tenosynovectomy, possible resection of one slip of the superficialis dictated by findings.  Pre, peri and postoperative course have been discussed along with risks and complications.  She is aware that there is no guarantee with surgery, possibility of infection, recurrence injury to arteries, nerves, tendons, incomplete relief of symptoms and dystrophy.  In the preoperative area, the patient is seen.  The extremity marked by both the patient and surgeon.  Antibiotic given.  PROCEDURE:  The patient is brought to the operating room where an axillary block was carried out without difficulty under the direction of Dr. Gelene Mink.  She was prepped using ChloraPrep, supine position, right arm free.  A 3-minute time-out taken confirming the patient and procedure.  The limb was exsanguinated with an Esmarch bandage, tourniquet placed high and the arm was inflated to 250 mmHg.  A  Brunner incision was made above both the index and middle fingers of her right hand, carried down through subcutaneous tissue.  Bleeders were electrocauterized with bipolar.  Neurovascular bundles identified and protected both radially and ulnarly.  These revealed the tenosynovial tissue thickened on both the index and middle finger.  Tenosynovectomy was performed on the index finger over the entire course of the pulley system proximally to the lumbrical muscle.  The specimen was sent to pathology.  The middle finger a similar tenosynovectomy was performed. This however was found to have a large mass beneath the A2 pulley which involved the superficialis, the ulnar limb was then excised in that this was markedly enlarged.  This was excised leaving its attachment at the very distal margin obliquely cutting this proximally and then suturing the cut and the remaining superficialis with figure-of-eight 4-0 Mersilene sutures.  Wounds were copiously irrigated with saline. Fingers placed through a full range motion and no further triggering was noted.  The wounds were then closed with interrupted 5-0 Vicryl Rapide sutures.  Sterile compressive dressing and splint with the fingers in neutral position, flexor to the metacarpophalangeal joint, extended PIP, DIP joints was applied.  On deflation of the tourniquet, all fingers immediately pinked.  She was taken to the recovery room for  observation in satisfactory condition.  She will be discharged home to return to the [facility] in 1 week on Vicodin.          ______________________________ Cindee Salt, M.D.     GK/MEDQ  D:  02/17/2011  T:  02/18/2011  Job:  [id number]  Electronically Signed by Cindee Salt M.D. on 05/28/2011 09:08:09 AM

## 2011-06-28 ENCOUNTER — Emergency Department (HOSPITAL_COMMUNITY)
Admission: EM | Admit: 2011-06-28 | Discharge: 2011-06-28 | Disposition: A | Discharge status: Home / Self Care | Payer: 59 | Attending: Emergency Medicine | Admitting: Emergency Medicine

## 2011-06-28 ENCOUNTER — Emergency Department (HOSPITAL_COMMUNITY): Payer: 59

## 2011-06-28 ENCOUNTER — Encounter (HOSPITAL_COMMUNITY): Payer: Self-pay | Admitting: Radiology

## 2011-06-28 ENCOUNTER — Inpatient Hospital Stay (INDEPENDENT_AMBULATORY_CARE_PROVIDER_SITE_OTHER)
Admission: RE | Admit: 2011-06-28 | Discharge: 2011-06-28 | Disposition: A | Discharge status: Home / Self Care | Payer: 59 | Source: Ambulatory Visit | Attending: Family Medicine | Admitting: Family Medicine

## 2011-06-28 DIAGNOSIS — M069 Rheumatoid arthritis, unspecified: Secondary | ICD-10-CM | POA: Insufficient documentation

## 2011-06-28 DIAGNOSIS — R300 Dysuria: Secondary | ICD-10-CM | POA: Insufficient documentation

## 2011-06-28 DIAGNOSIS — R10811 Right upper quadrant abdominal tenderness: Secondary | ICD-10-CM

## 2011-06-28 DIAGNOSIS — K8020 Calculus of gallbladder without cholecystitis without obstruction: Secondary | ICD-10-CM | POA: Insufficient documentation

## 2011-06-28 DIAGNOSIS — Z79899 Other long term (current) drug therapy: Secondary | ICD-10-CM | POA: Insufficient documentation

## 2011-06-28 DIAGNOSIS — R1011 Right upper quadrant pain: Secondary | ICD-10-CM | POA: Insufficient documentation

## 2011-06-28 DIAGNOSIS — E039 Hypothyroidism, unspecified: Secondary | ICD-10-CM | POA: Insufficient documentation

## 2011-06-28 DIAGNOSIS — R110 Nausea: Secondary | ICD-10-CM | POA: Insufficient documentation

## 2011-06-28 DIAGNOSIS — E785 Hyperlipidemia, unspecified: Secondary | ICD-10-CM | POA: Insufficient documentation

## 2011-06-28 HISTORY — DX: Essential (primary) hypertension: I10

## 2011-06-28 LAB — DIFFERENTIAL
Basophils Absolute: 0 10*3/uL (ref 0.0–0.1)
Basophils Relative: 0 % (ref 0–1)
Lymphocytes Relative: 17 % (ref 12–46)
Neutro Abs: 5.7 10*3/uL (ref 1.7–7.7)
Neutrophils Relative %: 74 % (ref 43–77)

## 2011-06-28 LAB — CBC
HCT: 39.9 % (ref 36.0–46.0)
Hemoglobin: 13.3 g/dL (ref 12.0–15.0)
MCHC: 33.3 g/dL (ref 30.0–36.0)
WBC: 7.7 10*3/uL (ref 4.0–10.5)

## 2011-06-28 LAB — URINE MICROSCOPIC-ADD ON

## 2011-06-28 LAB — COMPREHENSIVE METABOLIC PANEL
ALT: 24 U/L (ref 0–35)
Alkaline Phosphatase: 127 U/L — ABNORMAL HIGH (ref 39–117)
CO2: 31 mEq/L (ref 19–32)
Chloride: 105 mEq/L (ref 96–112)
GFR calc Af Amer: >60 mL/min (ref >60)
Glucose, Bld: 104 mg/dL — ABNORMAL HIGH (ref 70–99)
Potassium: 4.2 mEq/L (ref 3.5–5.1)
Sodium: 141 mEq/L (ref 135–145)
Total Protein: 7.1 g/dL (ref 6.0–8.3)

## 2011-06-28 LAB — POCT URINALYSIS DIP (DEVICE)
Bilirubin Urine: NEGATIVE
Glucose, UA: NEGATIVE mg/dL
Nitrite: NEGATIVE
Urobilinogen, UA: 0.2 mg/dL (ref 0.0–1.0)

## 2011-06-28 LAB — URINALYSIS, ROUTINE W REFLEX MICROSCOPIC
Bilirubin Urine: NEGATIVE
Hgb urine dipstick: NEGATIVE
Specific Gravity, Urine: 1.008 (ref 1.005–1.030)
Urobilinogen, UA: 0.2 mg/dL (ref 0.0–1.0)
pH: 6.5 (ref 5.0–8.0)

## 2011-06-28 MED ORDER — IOHEXOL 300 MG/ML  SOLN
100.0000 mL | Freq: Once | INTRAMUSCULAR | Status: AC | PRN
  Administered 2011-06-28: 100 mL via INTRAVENOUS

## 2011-06-30 ENCOUNTER — Telehealth (INDEPENDENT_AMBULATORY_CARE_PROVIDER_SITE_OTHER): Payer: Self-pay | Admitting: General Surgery

## 2011-06-30 NOTE — Telephone Encounter (Signed)
Patient was in the ER on Monday, there is an order for a hyda, provider's name is Dr. Larkin Ina, but it's not signed so it's not and official order.  Will need orders if the test is to be done, please call.

## 2011-07-05 ENCOUNTER — Other Ambulatory Visit (INDEPENDENT_AMBULATORY_CARE_PROVIDER_SITE_OTHER): Payer: Self-pay | Admitting: General Surgery

## 2011-07-05 ENCOUNTER — Other Ambulatory Visit (HOSPITAL_COMMUNITY): Payer: Self-pay | Admitting: Emergency Medicine

## 2011-07-05 DIAGNOSIS — R1011 Right upper quadrant pain: Secondary | ICD-10-CM

## 2011-07-06 ENCOUNTER — Encounter (HOSPITAL_COMMUNITY)
Admission: RE | Admit: 2011-07-06 | Discharge: 2011-07-06 | Disposition: A | Discharge status: Home / Self Care | Payer: 59 | Source: Ambulatory Visit | Attending: General Surgery | Admitting: General Surgery

## 2011-07-06 DIAGNOSIS — R948 Abnormal results of function studies of other organs and systems: Secondary | ICD-10-CM | POA: Insufficient documentation

## 2011-07-06 DIAGNOSIS — R1011 Right upper quadrant pain: Secondary | ICD-10-CM | POA: Insufficient documentation

## 2011-07-06 MED ORDER — TECHNETIUM TC 99M MEBROFENIN IV KIT
5.0000 | PACK | Freq: Once | INTRAVENOUS | Status: AC | PRN
  Administered 2011-07-06: 5 via INTRAVENOUS

## 2011-08-30 ENCOUNTER — Encounter (INDEPENDENT_AMBULATORY_CARE_PROVIDER_SITE_OTHER): Payer: Self-pay | Admitting: Surgery

## 2011-08-31 ENCOUNTER — Encounter (INDEPENDENT_AMBULATORY_CARE_PROVIDER_SITE_OTHER): Payer: Self-pay | Admitting: Surgery

## 2011-08-31 ENCOUNTER — Ambulatory Visit (INDEPENDENT_AMBULATORY_CARE_PROVIDER_SITE_OTHER): Payer: 59 | Admitting: Surgery

## 2011-08-31 VITALS — BP 144/80 | HR 72 | Temp 98.7°F | Resp 16 | Ht 64.0 in | Wt 175.2 lb

## 2011-08-31 DIAGNOSIS — K811 Chronic cholecystitis: Secondary | ICD-10-CM

## 2011-08-31 NOTE — Progress Notes (Signed)
Chief Complaint  Patient presents with  . Other    new pt- eval GB with stones    HPI [name] is a 65 y.o. female.   HPIThis is a pleasant female referred by Dr. Oneta Rack for evaluation of right upper quadrant abdominal pain. The patient has been having right upper quadrant abdominal pain through to her back as well as nausea off and on for several months. Over last week it has gotten acutely worse. She had CAT scan, ultrasound, and HIDA scan confirming dyskinesia as well as possible chronic cholecystitis and potentially a cystic duct stone. She denies any jaundice. Her pain is moderate to severe. It is ever for anywhere else. She denies difficulty with bowel movements  Past Medical History  Diagnosis Date  . Hypertension   . Hyperlipidemia   . Arthritis     Past Surgical History  Procedure Date  . Abdominal hysterectomy   . Carpal tunnel release   . Cystectomy     left breast  . Av fistula repair   . Bladder suspension   . Pilonidal cyst excision   . Trigger finger release   . Bunionectomy     Family History  Problem Relation Age of Onset  . Cancer Mother   . Cancer Father     Social History History  Substance Use Topics  . Smoking status: Former Games developer  . Smokeless tobacco: Not on file   Comment: qui t1985  . Alcohol Use: No    No Known Allergies  Current Outpatient Prescriptions  Medication Sig Dispense Refill  . acetaminophen-codeine (TYLENOL #3) 300-30 MG per tablet Take 1 tablet by mouth every 4 (four) hours as needed.        . ALPRAZolam (XANAX) 1 MG tablet Take 1 mg by mouth at bedtime as needed.        Marland Kitchen aspirin 81 MG tablet Take 81 mg by mouth daily.        . Cholecalciferol (VITAMIN D PO) Take by mouth daily.        . citalopram (CELEXA) 20 MG tablet Take 20 mg by mouth daily.        . Cyanocobalamin (VITAMIN B-12 PO) Take by mouth.        . cyclobenzaprine (FLEXERIL) 10 MG tablet Take 10 mg by mouth 3 (three) times daily as needed.          . ezetimibe (ZETIA) 10 MG tablet Take 10 mg by mouth daily.        . folic acid (FOLVITE) 1 MG tablet Take 1 mg by mouth daily.        . hydroxychloroquine (PLAQUENIL) 200 MG tablet Take by mouth daily.        . Ibuprofen (ADVIL PO) Take 800 mg by mouth as needed.        Marland Kitchen levothyroxine (SYNTHROID, LEVOTHROID) 75 MCG tablet Take 75 mcg by mouth daily.        Marland Kitchen MAGNESIUM PO Take by mouth daily.        . Methotrexate Sodium (METHOTREXATE PO) Take by mouth.        . Misc Natural Products (OSTEO BI-FLEX JOINT SHIELD PO) Take by mouth daily.        . Multiple Vitamins-Minerals (MULTI COMPLETE PO) Take by mouth.        . Multiple Vitamins-Minerals (ZINC PO) Take by mouth.        Marland Kitchen omeprazole (PRILOSEC) 40 MG capsule Take 40 mg by mouth daily.        Marland Kitchen  PROGESTERONE MICRONIZED PO Take by mouth.          Review of Systems Review of Systems  Constitutional: Negative for fever, chills and unexpected weight change.  HENT: Negative for hearing loss, congestion, sore throat, trouble swallowing and voice change.   Eyes: Negative for visual disturbance.  Respiratory: Negative for cough and wheezing.   Cardiovascular: Negative for chest pain, palpitations and leg swelling.  Gastrointestinal: Negative for nausea, vomiting, abdominal pain, diarrhea, constipation, blood in stool, abdominal distention and anal bleeding.  Genitourinary: Negative for hematuria, vaginal bleeding and difficulty urinating.  Musculoskeletal: Positive for arthralgias.  Skin: Negative for rash and wound.  Neurological: Negative for seizures, syncope and headaches.  Hematological: Negative for adenopathy. Does not bruise/bleed easily.  Psychiatric/Behavioral: Negative for confusion.    Blood pressure 144/80, pulse 72, temperature 98.7 F (37.1 C), temperature source Temporal, resp. rate 16, height 5\' 4"  (1.626 m), weight 175 lb 3.2 oz (79.47 kg).  Physical Exam Physical Exam  Constitutional: She is oriented to person, place,  and time. She appears well-developed and well-nourished. No distress.  HENT:  Head: Normocephalic and atraumatic.  Right Ear: External ear normal.  Left Ear: External ear normal.  Nose: Nose normal.  Mouth/Throat: Oropharynx is clear and moist.  Eyes: Conjunctivae are normal. Pupils are equal, round, and reactive to light. No scleral icterus.  Neck: Normal range of motion. Neck supple. No tracheal deviation present. No thyromegaly present.  Cardiovascular: Normal rate, regular rhythm and intact distal pulses.   Murmur heard. Pulmonary/Chest: Effort normal and breath sounds normal. No respiratory distress. She has no wheezes. She has no rales.  Abdominal: Soft. Bowel sounds are normal. There is tenderness. There is guarding.       Her tenderness in the right upper quadrant is mild with only guarding. The rest of the abdomen is soft and nontender  Musculoskeletal: Normal range of motion. She exhibits no edema and no tenderness.  Lymphadenopathy:    She has no cervical adenopathy.  Neurological: She is alert and oriented to person, place, and time.  Skin: Skin is warm and dry. No rash noted. No erythema.  Psychiatric: Her behavior is normal. Judgment normal.    Data Reviewed I have the notes on the patient which include labs with a normal white blood count and normal liver function tests. There is an ultrasound and CAT scan from August which showed some slight pericholecystic fluid and slight volar wall thickening. There was only a suggestion of a potential stone in the cystic duct. HIDA scan showed filling of the gallbladder but a 27% gallbladder ejection fraction.  Assessment    Patient with chronic cholecystitis as well as potential gallstones and dyskinesia    Plan    I discussed the procedure in detail.  The patient was given Agricultural engineer.  We discussed the risks and benefits of a laparoscopic cholecystectomy and possible cholangiogram including, but not limited to bleeding,  infection, injury to surrounding structures such as the intestine or liver, bile leak, retained gallstones, need to convert to an open procedure, prolonged diarrhea, blood clots such as  DVT, common bile duct injury, anesthesia risks, and possible need for additional procedures.  The likelihood of improvement in symptoms and return to the patient's normal status is good. We discussed the typical post-operative recovery course. A good outcome as expected.       Carrina Schoenberger A 08/31/2011, 10:25 AM

## 2011-09-06 ENCOUNTER — Other Ambulatory Visit (INDEPENDENT_AMBULATORY_CARE_PROVIDER_SITE_OTHER): Payer: Self-pay | Admitting: Surgery

## 2011-09-06 DIAGNOSIS — K8010 Calculus of gallbladder with chronic cholecystitis without obstruction: Secondary | ICD-10-CM

## 2011-09-07 HISTORY — PX: CHOLECYSTECTOMY: SHX55

## 2011-09-20 ENCOUNTER — Encounter (INDEPENDENT_AMBULATORY_CARE_PROVIDER_SITE_OTHER): Payer: Self-pay | Admitting: Surgery

## 2011-09-27 ENCOUNTER — Ambulatory Visit (INDEPENDENT_AMBULATORY_CARE_PROVIDER_SITE_OTHER): Payer: 59 | Admitting: Surgery

## 2011-09-27 ENCOUNTER — Encounter (INDEPENDENT_AMBULATORY_CARE_PROVIDER_SITE_OTHER): Payer: Self-pay | Admitting: Surgery

## 2011-09-27 VITALS — BP 144/78 | HR 60 | Temp 97.5°F | Resp 16 | Ht 63.5 in | Wt 171.5 lb

## 2011-09-27 DIAGNOSIS — Z09 Encounter for follow-up examination after completed treatment for conditions other than malignant neoplasm: Secondary | ICD-10-CM

## 2011-09-27 DIAGNOSIS — R1011 Right upper quadrant pain: Secondary | ICD-10-CM

## 2011-09-27 NOTE — Progress Notes (Signed)
Subjective:     Patient ID: [name], female   DOB: 02-03-1946, 65 y.o.   MRN: [id number]  HPI She is here for her first postoperative visit status post laparoscopic cholecystectomy performed on October 29. She reports she is still having sharp pain in her right upper quadrant. She's had minimal nausea no vomiting. She is moving her bowels well. She denies any jaundice. The pain again is sharp in nature Review of Systems     Objective:   Physical Exam On exam, she is afebrile and her vital signs are stable. Her abdomen is soft and minimally tender. Her exam shows her incisions are well-healed  Her final pathology showed chronic cholecystitis and cholelithiasis    Assessment:     Patient status post laparoscopic cholecystectomy with still significant discomfort    Plan:     Because of her discomfort and to check a CBC as well as a CMP and an ultrasound of her abdomen to rule out a biloma or bile leak or potential common bile duct stone.

## 2011-09-28 ENCOUNTER — Encounter: Payer: Self-pay | Admitting: Gastroenterology

## 2011-09-28 ENCOUNTER — Ambulatory Visit (INDEPENDENT_AMBULATORY_CARE_PROVIDER_SITE_OTHER): Payer: 59 | Admitting: Gastroenterology

## 2011-09-28 ENCOUNTER — Ambulatory Visit (HOSPITAL_COMMUNITY): Admit: 2011-09-28 | Payer: Self-pay | Admitting: Gastroenterology

## 2011-09-28 ENCOUNTER — Encounter (HOSPITAL_COMMUNITY): Payer: Self-pay

## 2011-09-28 ENCOUNTER — Telehealth: Payer: Self-pay | Admitting: Gastroenterology

## 2011-09-28 VITALS — BP 138/74 | HR 62 | Ht 63.0 in | Wt 170.0 lb

## 2011-09-28 DIAGNOSIS — R945 Abnormal results of liver function studies: Secondary | ICD-10-CM

## 2011-09-28 LAB — COMPREHENSIVE METABOLIC PANEL
Albumin: 4.2 g/dL (ref 3.5–5.2)
CO2: 29 mEq/L (ref 19–32)
Chloride: 103 mEq/L (ref 96–112)
Glucose, Bld: 94 mg/dL (ref 70–99)
Potassium: 4.3 mEq/L (ref 3.5–5.3)
Sodium: 140 mEq/L (ref 135–145)
Total Protein: 6.3 g/dL (ref 6.0–8.3)

## 2011-09-28 LAB — CBC
Hemoglobin: 12.9 g/dL (ref 12.0–15.0)
RBC: 4.32 MIL/uL (ref 3.87–5.11)

## 2011-09-28 SURGERY — ERCP, WITH INTERVENTION IF INDICATED
Anesthesia: Monitor Anesthesia Care

## 2011-09-28 NOTE — Assessment & Plan Note (Signed)
Cholestatic liver tests postoperatively, together with right upper quadrant pain is strongly suggestive of a retained bile duct stone. Bile duct leak is less likely since the pain is more intermittent and confined to the right upper quadrant.  Recommendations #1 ultrasound is already ordered #2 ERCP pending results of #1

## 2011-09-28 NOTE — Progress Notes (Signed)
HPI: [name] is a 65 year old white female referred at the request of Dr. Magnus Ivan for evaluation of abdominal pain and abnormal liver tests. Approximately 3 weeks ago she underwent laparoscopic cholecystectomy.  I see no record of an IOC. Since discharge she's had intermittent moderately severe right upper quadrant pain that radiates across her abdomen and to the back. It is reminiscent of the pain prior to her cholecystectomy. Gallbladder stones were seen. LFTs were normal in August, 2012. Recent set of LFTs were pertinent for an alkaline phosphatase 561 AST 204 and ALT 195. She is without fever or chills.        Review of Systems: Pertinent positive and negative review of systems were noted in the above HPI section. All other review of systems were otherwise negative.    Current Medications, Allergies, Past Medical History, Past Surgical History, Family History and Social History were reviewed in [employer] electronic medical record  Vital signs were reviewed in today's medical record. Physical Exam: General: Well developed , well nourished, no acute distress Head: Normocephalic and atraumatic Eyes:  sclerae anicteric, EOMI Ears: Normal auditory acuity Mouth: No deformity or lesions Lungs: Clear throughout to auscultation Heart: Regular rate and rhythm; no murmurs, rubs or bruits Abdomen: Soft, non tender and non distended. No masses, hepatosplenomegaly or hernias noted. Normal Bowel sounds Rectal:deferred Musculoskeletal: Symmetrical with no gross deformities  Pulses:  Normal pulses noted Extremities: No clubbing, cyanosis, edema or deformities noted Neurological: Alert oriented x 4, grossly nonfocal Psychological:  Alert and cooperative. Normal mood and affect

## 2011-09-28 NOTE — Assessment & Plan Note (Signed)
Recent hemoglobin 12.9 with normal MCV.

## 2011-09-28 NOTE — Telephone Encounter (Signed)
Pt saw Dr. Magnus Ivan with [facility] yesterday post gallbladder removal, pt had elevated LFT's yesterday. Pt scheduled to see Dr. Arlyce Dice today at 11:15am. Pt aware of appt date and time.

## 2011-09-28 NOTE — Patient Instructions (Signed)
Your ERCP is scheduled on 09/29/2011 at 1pm to arrive at 11am  Separate instructions given

## 2011-09-29 ENCOUNTER — Encounter (HOSPITAL_COMMUNITY)
Admission: RE | Disposition: A | Discharge status: Home / Self Care | Payer: Self-pay | Source: Ambulatory Visit | Attending: Gastroenterology

## 2011-09-29 ENCOUNTER — Encounter (HOSPITAL_COMMUNITY): Payer: Self-pay | Admitting: *Deleted

## 2011-09-29 ENCOUNTER — Ambulatory Visit (HOSPITAL_COMMUNITY)
Admission: RE | Admit: 2011-09-29 | Discharge: 2011-09-29 | Disposition: A | Discharge status: Home / Self Care | Payer: 59 | Source: Ambulatory Visit | Attending: Gastroenterology | Admitting: Gastroenterology

## 2011-09-29 ENCOUNTER — Ambulatory Visit (HOSPITAL_COMMUNITY): Payer: 59

## 2011-09-29 ENCOUNTER — Other Ambulatory Visit: Payer: Self-pay | Admitting: Gastroenterology

## 2011-09-29 ENCOUNTER — Encounter (HOSPITAL_COMMUNITY): Payer: Self-pay | Admitting: Anesthesiology

## 2011-09-29 ENCOUNTER — Telehealth: Payer: Self-pay | Admitting: Cardiology

## 2011-09-29 ENCOUNTER — Ambulatory Visit (HOSPITAL_COMMUNITY): Payer: 59 | Admitting: Anesthesiology

## 2011-09-29 ENCOUNTER — Ambulatory Visit
Admission: RE | Admit: 2011-09-29 | Discharge: 2011-09-29 | Disposition: A | Discharge status: Home / Self Care | Payer: 59 | Source: Ambulatory Visit | Attending: Surgery | Admitting: Surgery

## 2011-09-29 DIAGNOSIS — Z09 Encounter for follow-up examination after completed treatment for conditions other than malignant neoplasm: Secondary | ICD-10-CM

## 2011-09-29 DIAGNOSIS — K8020 Calculus of gallbladder without cholecystitis without obstruction: Secondary | ICD-10-CM

## 2011-09-29 DIAGNOSIS — R945 Abnormal results of liver function studies: Secondary | ICD-10-CM | POA: Insufficient documentation

## 2011-09-29 DIAGNOSIS — R1011 Right upper quadrant pain: Secondary | ICD-10-CM

## 2011-09-29 DIAGNOSIS — R933 Abnormal findings on diagnostic imaging of other parts of digestive tract: Secondary | ICD-10-CM

## 2011-09-29 DIAGNOSIS — K8050 Calculus of bile duct without cholangitis or cholecystitis without obstruction: Secondary | ICD-10-CM

## 2011-09-29 HISTORY — PX: ERCP: SHX5425

## 2011-09-29 SURGERY — ERCP, WITH INTERVENTION IF INDICATED
Anesthesia: Monitor Anesthesia Care

## 2011-09-29 MED ORDER — BUTAMBEN-TETRACAINE-BENZOCAINE 2-2-14 % EX AERO
INHALATION_SPRAY | CUTANEOUS | Status: DC | PRN
  Administered 2011-09-29: 2 via TOPICAL

## 2011-09-29 MED ORDER — LACTATED RINGERS IV SOLN
INTRAVENOUS | Status: DC
  Administered 2011-09-29: 13:00:00 via INTRAVENOUS

## 2011-09-29 MED ORDER — GLUCAGON HCL (RDNA) 1 MG IJ SOLR
INTRAMUSCULAR | Status: DC | PRN
  Administered 2011-09-29: 1 mg via INTRAVENOUS

## 2011-09-29 MED ORDER — CIPROFLOXACIN IN D5W 400 MG/200ML IV SOLN
400.0000 mg | Freq: Once | INTRAVENOUS | Status: AC
  Administered 2011-09-29: 400 mg via INTRAVENOUS

## 2011-09-29 MED ORDER — FENTANYL CITRATE 0.05 MG/ML IJ SOLN
INTRAMUSCULAR | Status: DC | PRN
  Administered 2011-09-29: 100 ug via INTRAVENOUS

## 2011-09-29 MED ORDER — PROPOFOL 10 MG/ML IV EMUL
INTRAVENOUS | Status: DC | PRN
  Administered 2011-09-29: 100 ug/kg/min via INTRAVENOUS

## 2011-09-29 MED ORDER — MIDAZOLAM HCL 5 MG/5ML IJ SOLN
INTRAMUSCULAR | Status: DC | PRN
  Administered 2011-09-29: 2 mg via INTRAVENOUS

## 2011-09-29 MED ORDER — GLYCOPYRROLATE 0.2 MG/ML IJ SOLN
INTRAMUSCULAR | Status: DC | PRN
  Administered 2011-09-29: 0.2 mg via INTRAVENOUS

## 2011-09-29 MED ORDER — CIPROFLOXACIN IN D5W 400 MG/200ML IV SOLN
INTRAVENOUS | Status: AC
  Filled 2011-09-29: qty 200

## 2011-09-29 NOTE — Transfer of Care (Signed)
Immediate Anesthesia Transfer of Care Note  Patient: [name]  Procedure(s) Performed:  ENDOSCOPIC RETROGRADE CHOLANGIOPANCREATOGRAPHY (ERCP)  Patient Location: PACU  Anesthesia Type: MAC  Level of Consciousness: awake, alert , oriented and patient cooperative  Airway & Oxygen Therapy: Patient Spontanous Breathing and Patient connected to nasal cannula oxygen  Post-op Assessment: Report given to PACU RN and Post -op Vital signs reviewed and stable  Post vital signs: Reviewed and stable  Complications: No apparent anesthesia complications

## 2011-09-29 NOTE — Anesthesia Preprocedure Evaluation (Signed)
Anesthesia Evaluation  Patient identified by MRN, date of birth, ID band Patient awake    Reviewed: Allergy & Precautions, H&P , NPO status , Patient's Chart, lab work & pertinent test results, reviewed documented beta blocker date and time   Airway Mallampati: II TM Distance: >3 FB Neck ROM: Full    Dental  (+) Upper Dentures and Lower Dentures   Pulmonary neg pulmonary ROS,  clear to auscultation        Cardiovascular neg cardio ROS Regular Normal+ Systolic murmurs Denies cardiac symptoms Denies hx of HTN   Neuro/Psych Negative Neurological ROS  Negative Psych ROS   GI/Hepatic Neg liver ROS, ABD pain   Endo/Other  Hypothyroidism Thyroid replacement  Renal/GU negative Renal ROS  Genitourinary negative   Musculoskeletal negative musculoskeletal ROS (+)   Abdominal   Peds negative pediatric ROS (+)  Hematology negative hematology ROS (+)   Anesthesia Other Findings   Reproductive/Obstetrics negative OB ROS                           Anesthesia Physical Anesthesia Plan  ASA: II  Anesthesia Plan: General   Post-op Pain Management:    Induction: Intravenous  Airway Management Planned: Oral ETT  Additional Equipment:   Intra-op Plan:   Post-operative Plan: Extubation in OR  Informed Consent: I have reviewed the patients History and Physical, chart, labs and discussed the procedure including the risks, benefits and alternatives for the proposed anesthesia with the patient or authorized representative who has indicated his/her understanding and acceptance.     Plan Discussed with: CRNA and Surgeon  Anesthesia Plan Comments:         Anesthesia Quick Evaluation

## 2011-09-29 NOTE — Brief Op Note (Signed)
09/29/2011  2:25 PM  Endoscopy report is contained in the [facility] report.

## 2011-09-29 NOTE — Anesthesia Postprocedure Evaluation (Signed)
  Anesthesia Post-op Note  Patient: [name]  Procedure(s) Performed:  ENDOSCOPIC RETROGRADE CHOLANGIOPANCREATOGRAPHY (ERCP)  Patient Location: PACU  Anesthesia Type: MAC  Level of Consciousness: oriented and sedated  Airway and Oxygen Therapy: Patient Spontanous Breathing  Post-op Pain: mild  Post-op Assessment: Post-op Vital signs reviewed, Patient's Cardiovascular Status Stable, Respiratory Function Stable and Patent Airway  Post-op Vital Signs: stable  Complications: No apparent anesthesia complications

## 2011-10-04 ENCOUNTER — Encounter (INDEPENDENT_AMBULATORY_CARE_PROVIDER_SITE_OTHER): Payer: Self-pay | Admitting: Surgery

## 2011-10-04 ENCOUNTER — Ambulatory Visit (INDEPENDENT_AMBULATORY_CARE_PROVIDER_SITE_OTHER): Payer: 59 | Admitting: Surgery

## 2011-10-04 DIAGNOSIS — Z09 Encounter for follow-up examination after completed treatment for conditions other than malignant neoplasm: Secondary | ICD-10-CM

## 2011-10-04 NOTE — Progress Notes (Signed)
Subjective:     Patient ID: [name], female   DOB: 07/17/1946, 65 y.o.   MRN: [id number]  HPI  She is here for another postoperative visit. After I saw her last week we checked her liver function tests which were all elevated. I sent her to Dr. Arlyce Dice and he performed an ERCP and remove the stone. Today she is doing well and is pain-free. Review of Systems     Objective:   Physical Exam On exam, her abdomen is soft and nontender    Assessment:     Patient status post laparoscopic cholecystectomy and retained common bile duct stone now status post ERCP and doing well    Plan:     She will return to normal activity. I will see her back as needed

## 2011-10-04 NOTE — H&P (Signed)
[name]   09/28/2011 11:15 AM Office Visit  MRN: [id number]   Description: 65 year old female  Provider: Melvia Heaps, MD  Department: [facility]        Diagnoses     Nonspecific abnormal results of liver function study   - Primary    794.8      Reason for Visit     Abnormal Lab    Elevated LFTs         Vitals - Last Recorded       BP Pulse Ht Wt BMI    138/74  62  5\' 3"  (1.6 m)  77.111 kg (170 lb)  30.11 kg/m2       Vitals History Recorded       Progress Notes     Tammie Heaps, MD  09/29/2011  8:52 AM  Signed HPI: [name] is a 65 year old white female referred at the request of [name] for evaluation of abdominal pain and abnormal liver tests. Approximately 3 weeks ago she underwent laparoscopic cholecystectomy.  I see no record of an IOC. Since discharge she's had intermittent moderately severe right upper quadrant pain that radiates across her abdomen and to the back. It is reminiscent of the pain prior to her cholecystectomy. Gallbladder stones were seen. LFTs were normal in August, 2012. Recent set of LFTs were pertinent for an alkaline phosphatase 561 AST 204 and ALT 195. She is without fever or chills.             Review of Systems: Pertinent positive and negative review of systems were noted in the above HPI section. All other review of systems were otherwise negative.       Current Medications, Allergies, Past Medical History, Past Surgical History, Family History and Social History were reviewed in [employer] [facility] electronic medical record   Vital signs were reviewed in today's medical record. Physical Exam: General: Well developed , well nourished, no acute distress Head: Normocephalic and atraumatic Eyes:  sclerae anicteric, EOMI Ears: Normal auditory acuity Mouth: No deformity or lesions Lungs: Clear throughout to auscultation Heart: Regular rate and rhythm; no murmurs, rubs or bruits Abdomen: Soft, non  tender and non distended. No masses, hepatosplenomegaly or hernias noted. Normal Bowel sounds Rectal:deferred Musculoskeletal: Symmetrical with no gross deformities   Pulses:  Normal pulses noted Extremities: No clubbing, cyanosis, edema or deformities noted Neurological: Alert oriented x 4, grossly nonfocal Psychological:  Alert and cooperative. Normal mood and affect            Nonspecific abnormal results of liver function study - Tammie Heaps, MD  09/28/2011 11:48 AM  Signed Cholestatic liver tests postoperatively, together with right upper quadrant pain is strongly suggestive of a retained bile duct stone. Bile duct leak is less likely since the pain is more intermittent and confined to the right upper quadrant.   Recommendations #1 ultrasound is already ordered #2 ERCP pending results of #1  Anemia of other chronic disease - Tammie Heaps, MD  09/28/2011 11:51 AM  Signed Recent hemoglobin 12.9 with normal MCV.     Electronic signature on 09/28/2011        Not recorded       Discontinued Medications         Reason for Discontinue    levothyroxine (SYNTHROID, LEVOTHROID) 75 MCG tablet      Multiple Vitamins-Minerals (ZINC PO)      ondansetron (ZOFRAN) 4 MG tablet      acetaminophen-codeine (TYLENOL #3)  300-30 MG per tablet         Orders Placed This Encounter     Ambulatory referral to Gastroenterology [ZOX09 Custom]         Patient Instructions     Your ERCP is scheduled on 09/29/2011 at 1pm to arrive at 11am   Separate instructions given       Level of Service     PR OFFICE/OUTPT VISIT,EST,LEVL IV [60454]         All Flowsheet Templates (all recorded)     Encounter Vitals Flowsheet    Custom Formula Data Flowsheet    Anthropometrics Flowsheet                   Letters     Letter Information         Status    [name] on 09/28/2011 Sent           Referring Provider     Tammie Corwin, MD       All Charges for  This Encounter       Code Description Service Date Service Provider Modifiers Quantity    99214 PR OFFICE/OUTPT VISIT,EST,LEVL IV 09/28/2011 Tammie Meckel, MD   1      Routing History       Recipient Method User Date Routed To    [name] [facility] [name], [place] [[id number]] 09/28/2011 (none on file)    [address] [place] [address] Phone: [phone] Letter: created on 09/28/2011 by Tammie Meckel, MD              Other Encounter Related Information     Allergies & Medications         Problem List         History         Patient-Entered Questionnaires     No data filed

## 2011-10-05 ENCOUNTER — Encounter: Payer: Self-pay | Admitting: Cardiology

## 2011-10-05 ENCOUNTER — Encounter (HOSPITAL_COMMUNITY): Payer: Self-pay | Admitting: Gastroenterology

## 2011-10-06 ENCOUNTER — Ambulatory Visit (INDEPENDENT_AMBULATORY_CARE_PROVIDER_SITE_OTHER): Payer: 59 | Admitting: Gastroenterology

## 2011-10-06 ENCOUNTER — Encounter: Payer: Self-pay | Admitting: Gastroenterology

## 2011-10-06 DIAGNOSIS — R945 Abnormal results of liver function studies: Secondary | ICD-10-CM

## 2011-10-06 DIAGNOSIS — Z1211 Encounter for screening for malignant neoplasm of colon: Secondary | ICD-10-CM

## 2011-10-06 NOTE — Patient Instructions (Signed)
Patient will call back to schedule her pre-visit and colonoscopy. CC: Lucky Cowboy, MD

## 2011-10-06 NOTE — Assessment & Plan Note (Signed)
Last colonoscopy 2002. Plan followup colonoscopy

## 2011-10-06 NOTE — Progress Notes (Signed)
History of Present Illness:  Mrs. Landen has returned following ERCP with sphincterotomy and stone extraction. She's had no complaints since that time. Specifically, she is without abdominal pain, pruritus or fever.    Review of Systems: Pertinent positive and negative review of systems were noted in the above HPI section. All other review of systems were otherwise negative.    Current Medications, Allergies, Past Medical History, Past Surgical History, Family History and Social History were reviewed in [employer] electronic medical record  Vital signs were reviewed in today's medical record. Physical Exam: General: Well developed , well nourished, no acute distress

## 2011-10-07 ENCOUNTER — Ambulatory Visit (INDEPENDENT_AMBULATORY_CARE_PROVIDER_SITE_OTHER): Payer: 59 | Admitting: Cardiology

## 2011-10-07 ENCOUNTER — Encounter: Payer: Self-pay | Admitting: Cardiology

## 2011-10-07 VITALS — BP 144/80 | HR 76 | Ht 63.5 in | Wt 172.8 lb

## 2011-10-07 DIAGNOSIS — E782 Mixed hyperlipidemia: Secondary | ICD-10-CM | POA: Insufficient documentation

## 2011-10-07 DIAGNOSIS — R011 Cardiac murmur, unspecified: Secondary | ICD-10-CM

## 2011-10-07 DIAGNOSIS — E785 Hyperlipidemia, unspecified: Secondary | ICD-10-CM

## 2011-10-07 NOTE — Assessment & Plan Note (Signed)
Probable flow murmur on examination. No symptoms. Will arrange echocardiogram to exclude significant valvular disease.

## 2011-10-07 NOTE — Assessment & Plan Note (Signed)
Management per primary care. 

## 2011-10-07 NOTE — Patient Instructions (Signed)
Your physician recommends that you schedule a follow-up appointment in: AS NEEDED  Your physician has requested that you have an echocardiogram. Echocardiography is a painless test that uses sound waves to create images of your heart. It provides your doctor with information about the size and shape of your heart and how well your heart's chambers and valves are working. This procedure takes approximately one hour. There are no restrictions for this procedure.   

## 2011-10-07 NOTE — Progress Notes (Signed)
HPI: 64 yo female with no prior cardiac history for evaluation of murmur. Patient has had a murmur for some time. However she does not have dyspnea on exertion, orthopnea, PND, pedal edema, palpitations or exertional chest pain. She has mild dizziness occasionally with standing. She also has this when looking up and has had for years.  Current Outpatient Prescriptions  Medication Sig Dispense Refill  . ALPRAZolam (XANAX) 1 MG tablet Take 1 mg by mouth daily.        Marland Kitchen aspirin 81 MG tablet Take 81 mg by mouth daily.        . Cholecalciferol (VITAMIN D PO) Take by mouth daily.        . citalopram (CELEXA) 20 MG tablet Take 20 mg by mouth daily.        . Cyanocobalamin (VITAMIN B-12 PO) Take by mouth.        . cyclobenzaprine (FLEXERIL) 10 MG tablet Take 10 mg by mouth as needed. 1-2 tablets      . ezetimibe (ZETIA) 10 MG tablet Take 10 mg by mouth daily.        . folic acid (FOLVITE) 1 MG tablet Take 2 mg by mouth daily.       . hydroxychloroquine (PLAQUENIL) 200 MG tablet Take by mouth daily. Take 1 tab AM and PM      . Ibuprofen (ADVIL PO) Take 800 mg by mouth as needed.        Marland Kitchen LEVOTHYROXINE SODIUM PO Take 1 tablet by mouth.        Marland Kitchen MAGNESIUM PO Take by mouth daily.        . Methotrexate Sodium (METHOTREXATE PO) Take by mouth. Takes 17.5 mg once a week      . Misc Natural Products (OSTEO BI-FLEX JOINT SHIELD PO) Take 1 tablet by mouth 2 (two) times daily.       . Multiple Vitamins-Minerals (MULTI COMPLETE PO) Take by mouth.        Marland Kitchen omeprazole (PRILOSEC) 40 MG capsule Take 40 mg by mouth daily.        Marland Kitchen PROGESTERONE MICRONIZED PO Take by mouth. 50 mg once daily        No Known Allergies  Past Medical History  Diagnosis Date  . Hypertension   . Hyperlipidemia   . Arthritis   . Hypothyroid   . Anemia     Hx of   . H. pylori infection     Hx of   . GERD (gastroesophageal reflux disease)   . PUD (peptic ulcer disease)   . Calculus of bile duct without mention of cholecystitis or  obstruction   . Rheumatoid arthritis     Past Surgical History  Procedure Date  . Abdominal hysterectomy   . Carpal tunnel release   . Cystectomy     left breast  . Av fistula repair   . Bladder suspension   . Pilonidal cyst excision   . Trigger finger release   . Bunionectomy   . Cholecystectomy 09/07/2011  . Ercp 09/29/2011    Procedure: ENDOSCOPIC RETROGRADE CHOLANGIOPANCREATOGRAPHY (ERCP);  Surgeon: Louis Meckel, MD;  Location: [facility];  Service: Endoscopy;  Laterality: N/A;  . Tonsillectomy     History   Social History  . Marital Status: Married    Spouse Name: N/A    Number of Children: 2  . Years of Education: N/A   Occupational History  .      Retired   Social History Main Topics  .  Smoking status: Former Games developer  . Smokeless tobacco: Never Used   Comment: Johnnye Lana [id number]  . Alcohol Use: Yes     occasional  . Drug Use: No  . Sexually Active: Yes    Birth Control/ Protection: Post-menopausal   Other Topics Concern  . Not on file   Social History Narrative  . No narrative on file    Family History  Problem Relation Age of Onset  . Lung cancer Mother   . Stomach cancer Father   . Colon cancer Neg Hx   . Heart attack Father     MI at age 59  . Heart attack Brother     MI at age 76    ROS:  Arthralgias but no fevers or chills, productive cough, hemoptysis, dysphasia, odynophagia, melena, hematochezia, dysuria, hematuria, rash, seizure activity, orthopnea, PND, pedal edema, claudication. Remaining systems are negative.  Physical Exam:   Blood pressure 144/80, pulse 76, height 5' 3.5" (1.613 m), weight 172 lb 12.8 oz (78.382 kg).  General:  Well developed/well nourished in NAD Skin warm/dry Patient not depressed No peripheral clubbing Back-normal HEENT-normal/normal eyelids Neck supple/normal carotid upstroke bilaterally; no bruits; no JVD; no thyromegaly chest - CTA/ normal expansion CV - RRR/normal S1 and S2; no rubs or gallops;  PMI  nondisplaced; 2/6 systolic ejection murmur. No change with Valsalva. Abdomen -NT/ND, no HSM, no mass, + bowel sounds, no bruit 2+ femoral pulses, no bruits Ext-no edema, chords, 2+ DP Neuro-grossly nonfocal  ECG normal sinus rhythm, cannot rule out prior inferior infarct, nonspecific T wave changes.

## 2011-10-11 ENCOUNTER — Other Ambulatory Visit: Payer: Self-pay | Admitting: Orthopedic Surgery

## 2011-10-11 ENCOUNTER — Ambulatory Visit (HOSPITAL_COMMUNITY): Payer: 59 | Attending: Cardiology | Admitting: Radiology

## 2011-10-11 DIAGNOSIS — I079 Rheumatic tricuspid valve disease, unspecified: Secondary | ICD-10-CM | POA: Insufficient documentation

## 2011-10-11 DIAGNOSIS — R011 Cardiac murmur, unspecified: Secondary | ICD-10-CM | POA: Insufficient documentation

## 2011-10-11 DIAGNOSIS — I059 Rheumatic mitral valve disease, unspecified: Secondary | ICD-10-CM | POA: Insufficient documentation

## 2011-10-20 ENCOUNTER — Ambulatory Visit: Payer: 59 | Admitting: Internal Medicine

## 2011-10-25 ENCOUNTER — Encounter (HOSPITAL_BASED_OUTPATIENT_CLINIC_OR_DEPARTMENT_OTHER): Payer: Self-pay | Admitting: *Deleted

## 2011-10-25 NOTE — Progress Notes (Signed)
Has had several trigger fingers here Recent lap choli then had to have an ercp post op for a stone No pain now.

## 2011-10-26 ENCOUNTER — Encounter (HOSPITAL_BASED_OUTPATIENT_CLINIC_OR_DEPARTMENT_OTHER): Payer: Self-pay

## 2011-10-26 ENCOUNTER — Ambulatory Visit (HOSPITAL_BASED_OUTPATIENT_CLINIC_OR_DEPARTMENT_OTHER)
Admission: RE | Admit: 2011-10-26 | Discharge: 2011-10-26 | Disposition: A | Discharge status: Home / Self Care | Payer: 59 | Source: Ambulatory Visit | Attending: Orthopedic Surgery | Admitting: Orthopedic Surgery

## 2011-10-26 ENCOUNTER — Encounter (HOSPITAL_BASED_OUTPATIENT_CLINIC_OR_DEPARTMENT_OTHER)
Admission: RE | Disposition: A | Discharge status: Home / Self Care | Payer: Self-pay | Source: Ambulatory Visit | Attending: Orthopedic Surgery

## 2011-10-26 ENCOUNTER — Ambulatory Visit (HOSPITAL_BASED_OUTPATIENT_CLINIC_OR_DEPARTMENT_OTHER): Payer: 59 | Admitting: Certified Registered Nurse Anesthetist

## 2011-10-26 ENCOUNTER — Encounter (HOSPITAL_BASED_OUTPATIENT_CLINIC_OR_DEPARTMENT_OTHER): Payer: Self-pay | Admitting: Certified Registered Nurse Anesthetist

## 2011-10-26 DIAGNOSIS — M65849 Other synovitis and tenosynovitis, unspecified hand: Secondary | ICD-10-CM | POA: Insufficient documentation

## 2011-10-26 DIAGNOSIS — M069 Rheumatoid arthritis, unspecified: Secondary | ICD-10-CM | POA: Insufficient documentation

## 2011-10-26 DIAGNOSIS — Z01812 Encounter for preprocedural laboratory examination: Secondary | ICD-10-CM | POA: Insufficient documentation

## 2011-10-26 DIAGNOSIS — M6530 Trigger finger, unspecified finger: Secondary | ICD-10-CM | POA: Insufficient documentation

## 2011-10-26 DIAGNOSIS — M65839 Other synovitis and tenosynovitis, unspecified forearm: Secondary | ICD-10-CM | POA: Insufficient documentation

## 2011-10-26 DIAGNOSIS — E039 Hypothyroidism, unspecified: Secondary | ICD-10-CM | POA: Insufficient documentation

## 2011-10-26 DIAGNOSIS — K219 Gastro-esophageal reflux disease without esophagitis: Secondary | ICD-10-CM | POA: Insufficient documentation

## 2011-10-26 DIAGNOSIS — Z87891 Personal history of nicotine dependence: Secondary | ICD-10-CM | POA: Insufficient documentation

## 2011-10-26 HISTORY — PX: TENOLYSIS: SHX396

## 2011-10-26 LAB — POCT HEMOGLOBIN-HEMACUE: Hemoglobin: 13.5 g/dL (ref 12.0–15.0)

## 2011-10-26 SURGERY — INCISION, TENDON SHEATH
Anesthesia: Monitor Anesthesia Care | Laterality: Right

## 2011-10-26 MED ORDER — FENTANYL CITRATE 0.05 MG/ML IJ SOLN
INTRAMUSCULAR | Status: DC | PRN
  Administered 2011-10-26: 50 ug via INTRAVENOUS

## 2011-10-26 MED ORDER — PROMETHAZINE HCL 25 MG/ML IJ SOLN: 6.2500 mg | INTRAMUSCULAR | Status: DC | PRN

## 2011-10-26 MED ORDER — LIDOCAINE HCL (PF) 0.5 % IJ SOLN
INTRAMUSCULAR | Status: DC | PRN
  Administered 2011-10-26: 30 mL

## 2011-10-26 MED ORDER — CHLORHEXIDINE GLUCONATE 4 % EX LIQD: 60.0000 mL | Freq: Once | CUTANEOUS | Status: DC

## 2011-10-26 MED ORDER — FENTANYL CITRATE 0.05 MG/ML IJ SOLN: 25.0000 ug | INTRAMUSCULAR | Status: DC | PRN

## 2011-10-26 MED ORDER — BUPIVACAINE HCL (PF) 0.25 % IJ SOLN
INTRAMUSCULAR | Status: DC | PRN
  Administered 2011-10-26: 3 mL

## 2011-10-26 MED ORDER — HYDROCODONE-ACETAMINOPHEN 5-500 MG PO TABS: 1.0000 | ORAL_TABLET | ORAL | Status: AC | PRN

## 2011-10-26 MED ORDER — LACTATED RINGERS IV SOLN
INTRAVENOUS | Status: DC
  Administered 2011-10-26: 09:00:00 via INTRAVENOUS

## 2011-10-26 MED ORDER — ONDANSETRON HCL 4 MG/2ML IJ SOLN
INTRAMUSCULAR | Status: DC | PRN
  Administered 2011-10-26: 4 mg via INTRAVENOUS

## 2011-10-26 MED ORDER — PROPOFOL 10 MG/ML IV EMUL
INTRAVENOUS | Status: DC | PRN
  Administered 2011-10-26: 75 ug/kg/min via INTRAVENOUS
  Administered 2011-10-26: 100 ug/kg/min via INTRAVENOUS

## 2011-10-26 MED ORDER — CEFAZOLIN SODIUM 1-5 GM-% IV SOLN
1.0000 g | INTRAVENOUS | Status: AC
  Administered 2011-10-26: 1 g via INTRAVENOUS

## 2011-10-26 MED ORDER — MIDAZOLAM HCL 5 MG/5ML IJ SOLN
INTRAMUSCULAR | Status: DC | PRN
  Administered 2011-10-26: 1 mg via INTRAVENOUS

## 2011-10-26 SURGICAL SUPPLY — 77 items
BAG DECANTER FOR FLEXI CONT (MISCELLANEOUS) IMPLANT
BALL CTTN LRG ABS STRL LF (GAUZE/BANDAGES/DRESSINGS)
BANDAGE GAUZE ELAST BULKY 4 IN (GAUZE/BANDAGES/DRESSINGS) ×2 IMPLANT
BLADE MINI RND TIP GREEN BEAV (BLADE) ×1 IMPLANT
BLADE SURG 15 STRL LF DISP TIS (BLADE) ×1 IMPLANT
BLADE SURG 15 STRL SS (BLADE) ×2
BNDG CMPR 9X4 STRL LF SNTH (GAUZE/BANDAGES/DRESSINGS) ×1
BNDG COHESIVE 3X5 TAN STRL LF (GAUZE/BANDAGES/DRESSINGS) ×2 IMPLANT
BNDG ESMARK 4X9 LF (GAUZE/BANDAGES/DRESSINGS) ×1 IMPLANT
CHLORAPREP W/TINT 26ML (MISCELLANEOUS) ×2 IMPLANT
CLOTH BEACON ORANGE TIMEOUT ST (SAFETY) ×2 IMPLANT
CORDS BIPOLAR (ELECTRODE) ×1 IMPLANT
COTTONBALL LRG STERILE PKG (GAUZE/BANDAGES/DRESSINGS) IMPLANT
COVER MAYO STAND STRL (DRAPES) ×2 IMPLANT
COVER TABLE BACK 60X90 (DRAPES) ×2 IMPLANT
CUFF TOURNIQUET SINGLE 18IN (TOURNIQUET CUFF) ×1 IMPLANT
DECANTER SPIKE VIAL GLASS SM (MISCELLANEOUS) IMPLANT
DRAIN TLS ROUND 10FR (DRAIN) IMPLANT
DRAPE EXTREMITY T 121X128X90 (DRAPE) ×2 IMPLANT
DRAPE OEC MINIVIEW 54X84 (DRAPES) IMPLANT
DRAPE SURG 17X23 STRL (DRAPES) ×2 IMPLANT
DRSG KUZMA FLUFF (GAUZE/BANDAGES/DRESSINGS) IMPLANT
GAUZE SPONGE 4X4 16PLY XRAY LF (GAUZE/BANDAGES/DRESSINGS) IMPLANT
GAUZE XEROFORM 1X8 LF (GAUZE/BANDAGES/DRESSINGS) ×2 IMPLANT
GLOVE BIO SURGEON STRL SZ 6 (GLOVE) ×1 IMPLANT
GLOVE BIO SURGEON STRL SZ 6.5 (GLOVE) ×1 IMPLANT
GLOVE BIO SURGEON STRL SZ7.5 (GLOVE) ×1 IMPLANT
GLOVE BIOGEL PI IND STRL 6.5 (GLOVE) IMPLANT
GLOVE BIOGEL PI IND STRL 8 (GLOVE) IMPLANT
GLOVE BIOGEL PI INDICATOR 6.5 (GLOVE) ×1
GLOVE BIOGEL PI INDICATOR 8 (GLOVE) ×1
GLOVE SURG ORTHO 8.0 STRL STRW (GLOVE) ×2 IMPLANT
GOWN BRE IMP PREV XXLGXLNG (GOWN DISPOSABLE) ×3 IMPLANT
GOWN PREVENTION PLUS XLARGE (GOWN DISPOSABLE) ×2 IMPLANT
LOOP VESSEL MAXI BLUE (MISCELLANEOUS) IMPLANT
NEEDLE 27GAX1X1/2 (NEEDLE) ×1 IMPLANT
NEEDLE HYPO 22GX1.5 SAFETY (NEEDLE) IMPLANT
NEEDLE KEITH (NEEDLE) IMPLANT
NS IRRIG 1000ML POUR BTL (IV SOLUTION) ×2 IMPLANT
PACK BASIN DAY SURGERY FS (CUSTOM PROCEDURE TRAY) ×2 IMPLANT
PAD CAST 3X4 CTTN HI CHSV (CAST SUPPLIES) ×1 IMPLANT
PAD CAST 4YDX4 CTTN HI CHSV (CAST SUPPLIES) IMPLANT
PADDING CAST ABS 3INX4YD NS (CAST SUPPLIES)
PADDING CAST ABS 4INX4YD NS (CAST SUPPLIES) ×1
PADDING CAST ABS COTTON 3X4 (CAST SUPPLIES) IMPLANT
PADDING CAST ABS COTTON 4X4 ST (CAST SUPPLIES) ×1 IMPLANT
PADDING CAST COTTON 3X4 STRL (CAST SUPPLIES) ×2
PADDING CAST COTTON 4X4 STRL (CAST SUPPLIES)
SLEEVE SCD COMPRESS KNEE MED (MISCELLANEOUS) IMPLANT
SPLINT PLASTER CAST XFAST 3X15 (CAST SUPPLIES) IMPLANT
SPLINT PLASTER XTRA FASTSET 3X (CAST SUPPLIES) ×5
SPONGE GAUZE 4X4 12PLY (GAUZE/BANDAGES/DRESSINGS) ×2 IMPLANT
STOCKINETTE 4X48 STRL (DRAPES) ×2 IMPLANT
SUT CHROMIC 5 0 P 3 (SUTURE) IMPLANT
SUT ETHIBOND 3-0 V-5 (SUTURE) IMPLANT
SUT MERSILENE 2.0 SH NDLE (SUTURE) IMPLANT
SUT MERSILENE 3 0 FS 1 (SUTURE) IMPLANT
SUT MERSILENE 4 0 P 3 (SUTURE) IMPLANT
SUT POLY BUTTON 15MM (SUTURE) IMPLANT
SUT PROLENE 2 0 SH DA (SUTURE) IMPLANT
SUT SILK 4 0 PS 2 (SUTURE) IMPLANT
SUT STEEL 3 0 (SUTURE) IMPLANT
SUT STEEL 4 0 V 26 (SUTURE) IMPLANT
SUT VIC AB 3-0 PS1 18 (SUTURE)
SUT VIC AB 3-0 PS1 18XBRD (SUTURE) IMPLANT
SUT VIC AB 4-0 P-3 18XBRD (SUTURE) IMPLANT
SUT VIC AB 4-0 P2 18 (SUTURE) IMPLANT
SUT VIC AB 4-0 P3 18 (SUTURE)
SUT VICRYL 4-0 PS2 18IN ABS (SUTURE) IMPLANT
SUT VICRYL RAPID 5 0 P 3 (SUTURE) IMPLANT
SUT VICRYL RAPIDE 4/0 PS 2 (SUTURE) ×2 IMPLANT
SYR BULB 3OZ (MISCELLANEOUS) ×2 IMPLANT
SYR CONTROL 10ML LL (SYRINGE) ×2 IMPLANT
TOWEL OR 17X24 6PK STRL BLUE (TOWEL DISPOSABLE) ×3 IMPLANT
TUBE FEEDING 5FR 15 INCH (TUBING) IMPLANT
UNDERPAD 30X30 INCONTINENT (UNDERPADS AND DIAPERS) ×2 IMPLANT
WATER STERILE IRR 1000ML POUR (IV SOLUTION) ×1 IMPLANT

## 2011-10-26 NOTE — H&P (Signed)
[name] returns today. She is a 65 year old female. She has not been seen in over 3 years.  She is seen at the request of Dr. Coral Spikes for a consultation with respect to catching of her left thumb. This has been going on for the past 2-3 months. She has no history of injury to it.  She does have a history of rheumatoid arthritis and thyroid problems.  She complains of occasionally having to take and pull it back up with her opposite hand.  She has moderate pain. She has been taking Advil for discomfort. She continues to have the triggering of her right index finger.   She is wondering about going in and having a portion of the superficialis removed as she had done on her middle finger for rheumatoid arthritis. We would agree with this. She does show triggering present. The pre, peri and post op course are discussed along with risks and complications. She is aware there is no guarantee with surgery, possibility of infection, recurrence, injury to arteries, nerves and tendons, incomplete relief of symptoms and dystrophy.    She is scheduled for tenosynovectomy excision of superficialis slip right index finger as an outpatient [facility] at her convenience.  Past Medical History: She has no allergies.  She is on Synthroid, Placquenil, anti-depressants, Methotrexate, folic acid, and Advil. She has had trigger finger release, cataract surgery, foot surgery, hysterectomy and carpal tunnel release.  Family Medical History: Positive for diabetes, heart disease, high BP and arthritis.  Social History: She does not smoke. She drinks socially. 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  Review of Systems: Positive for cataracts, balance problems, headaches, sleep disorder and depression, otherwise negative. [name] is an 65 y.o. female.   Chief Complaint: Trigger index finger HPI: see above  Past Medical History  Diagnosis Date  . Hyperlipidemia   .  Hypothyroid   . Anemia     Hx of   . H. pylori infection     Hx of   . GERD (gastroesophageal reflux disease)   . PUD (peptic ulcer disease)   . Calculus of bile duct without mention of cholecystitis or obstruction   . Hypertension     no meds  . Arthritis   . Rheumatoid arthritis     Past Surgical History  Procedure Date  . Abdominal hysterectomy   . Carpal tunnel release   . Cystectomy     left breast  . Av fistula repair   . Bladder suspension   . Pilonidal cyst excision   . Trigger finger release   . Bunionectomy   . Ercp 09/29/2011    Procedure: ENDOSCOPIC RETROGRADE CHOLANGIOPANCREATOGRAPHY (ERCP);  Surgeon: Louis Meckel, MD;  Location: [facility];  Service: Endoscopy;  Laterality: N/A;  . Tonsillectomy   . Cholecystectomy 09/07/2011    Family History  Problem Relation Age of Onset  . Lung cancer Mother   . Stomach cancer Father   . Colon cancer Neg Hx   . Heart attack Father     MI at age 36  . Heart attack Brother     MI at age 34   Social History:  reports that she quit smoking about 27 years ago. She has never used smokeless tobacco. She reports that she drinks alcohol. She reports that she does not use illicit drugs.  Allergies: No Known Allergies  Medications Prior to Admission  Medication Dose Route Frequency Provider Last Rate Last Dose  .  ceFAZolin (ANCEF) IVPB 1 g/50 mL premix  1 g Intravenous 60 min Pre-Op       . chlorhexidine (HIBICLENS) 4 % liquid 4 application  60 mL Topical Once       . lactated ringers infusion   Intravenous Continuous E. Jairo Ben, MD       Medications Prior to Admission  Medication Sig Dispense Refill  . ALPRAZolam (XANAX) 1 MG tablet Take 1 mg by mouth daily.        Marland Kitchen aspirin 81 MG tablet Take 81 mg by mouth daily.        . Cholecalciferol (VITAMIN D PO) Take by mouth daily.        . citalopram (CELEXA) 20 MG tablet Take 20 mg by mouth daily.        . Cyanocobalamin (VITAMIN B-12 PO) Take by mouth.          . cyclobenzaprine (FLEXERIL) 10 MG tablet Take 10 mg by mouth as needed. 1-2 tablets      . folic acid (FOLVITE) 1 MG tablet Take 2 mg by mouth daily.       . hydroxychloroquine (PLAQUENIL) 200 MG tablet Take by mouth daily. Take 1 tab AM and PM      . Ibuprofen (ADVIL PO) Take 800 mg by mouth as needed.        Marland Kitchen LEVOTHYROXINE SODIUM PO Take 1 tablet by mouth.        Marland Kitchen MAGNESIUM PO Take by mouth daily.        . Methotrexate Sodium (METHOTREXATE PO) Take by mouth. Takes 17.5 mg once a week      . Misc Natural Products (OSTEO BI-FLEX JOINT SHIELD PO) Take 1 tablet by mouth 2 (two) times daily.       . Multiple Vitamins-Minerals (MULTI COMPLETE PO) Take by mouth.        Marland Kitchen omeprazole (PRILOSEC) 40 MG capsule Take 40 mg by mouth daily.        Marland Kitchen PROGESTERONE MICRONIZED PO Take by mouth. 50 mg once daily      . ezetimibe (ZETIA) 10 MG tablet Take 10 mg by mouth daily.          No results found for this or any previous visit (from the past 48 hour(s)).  No results found.   Pertinent items are noted in HPI.  Blood pressure 151/83, pulse 79, temperature 97.8 F (36.6 C), temperature source Oral, resp. rate 20, SpO2 97.00%.  General appearance: alert, cooperative and appears stated age Head: Normocephalic, without obvious abnormality Neck: no adenopathy Resp: clear to auscultation bilaterally Cardio: regular rate and rhythm, S1, S2 normal, no murmur, click, rub or gallop GI: soft, non-tender; bowel sounds normal; no masses,  no organomegaly Extremities: extremities normal, atraumatic, no cyanosis or edema Pulses: 2+ and symmetric Skin: Skin color, texture, turgor normal. No rashes or lesions Neurologic: Grossly normal Incision/Wound: na  Assessment/Plan Excision FDS limb  [name] 10/26/2011, 8:55 AM

## 2011-10-26 NOTE — Brief Op Note (Signed)
10/26/2011  10:35 AM  PATIENT:  [name]  65 y.o. female  PRE-OPERATIVE DIAGNOSIS:  Trigger right index finger  POST-OPERATIVE DIAGNOSIS:  Trigger right index finger  PROCEDURE:  Procedure(s): TENDON SHEATH RELEASE/TENOLYSIS  SURGEON:  Surgeon(s): Nicki Reaper, MD  PHYSICIAN ASSISTANT:   ASSISTANTS: Karlyn Agee, MD   ANESTHESIA:   local and regional  EBL:  Total I/O In: 800 [I.V.:800] Out: -   BLOOD ADMINISTERED:none  DRAINS: none   LOCAL MEDICATIONS USED:  MARCAINE 4CC  SPECIMEN:  No Specimen  DISPOSITION OF SPECIMEN:  N/A  COUNTS:  YES  TOURNIQUET:  * Missing tourniquet times found for documented tourniquets in log:  12331 *  DICTATION: .Other Dictation: Dictation Number [id number]  PLAN OF CARE: Discharge to home after PACU  PATIENT DISPOSITION:  PACU - hemodynamically stable.   Delay start of Pharmacological VTE agent (>24hrs) due to surgical blood loss or risk of bleeding:  {YES/NO/NOT APPLICABLE:20182

## 2011-10-26 NOTE — Op Note (Signed)
Dictated number: [id number]

## 2011-10-26 NOTE — Anesthesia Postprocedure Evaluation (Signed)
  Anesthesia Post-op Note  Patient: [name]  Procedure(s) Performed:  TENDON SHEATH RELEASE/TENOLYSIS - tenosynovectomy removal superficialis slip right index finger  Patient Location: PACU  Anesthesia Type: Bier block  Level of Consciousness: awake, alert  and oriented  Airway and Oxygen Therapy: Patient Spontanous Breathing  Post-op Pain: none  Post-op Assessment: Post-op Vital signs reviewed, Patient's Cardiovascular Status Stable, Respiratory Function Stable, Patent Airway, No signs of Nausea or vomiting and Pain level controlled  Post-op Vital Signs: Reviewed and stable  Complications: No apparent anesthesia complications

## 2011-10-26 NOTE — Anesthesia Preprocedure Evaluation (Addendum)
Anesthesia Evaluation  Patient identified by MRN, date of birth, ID band Patient awake    Reviewed: Allergy & Precautions, H&P , NPO status , Patient's Chart, lab work & pertinent test results  Airway Mallampati: II TM Distance: >3 FB Neck ROM: Full    Dental  (+) Upper Dentures and Lower Dentures   Pulmonary former smoker (remote smoker) clear to auscultation  Pulmonary exam normal       Cardiovascular + Valvular Problems/Murmurs (echo 12/12  normal LVF, EF 55-60%, valves OK) Regular Normal+ Systolic murmurs (1/6 SEM)    Neuro/Psych TIA (possible TIA- carotid duplex normal)   GI/Hepatic Neg liver ROS, PUD, GERD-  Medicated and Controlled,Recent Cholecystectomy   Endo/Other  Hypothyroidism (on synthroid)   Renal/GU negative Renal ROS     Musculoskeletal  (+) Arthritis -,   Abdominal (+) obese,   Peds  Hematology negative hematology ROS (+)   Anesthesia Other Findings   Reproductive/Obstetrics                           Anesthesia Physical Anesthesia Plan  ASA: II  Anesthesia Plan: Bier Block and MAC   Post-op Pain Management:    Induction:   Airway Management Planned: Simple Face Mask  Additional Equipment:   Intra-op Plan:   Post-operative Plan:   Informed Consent:   Plan Discussed with: CRNA and Surgeon  Anesthesia Plan Comments: (Plan routine monitors, IV Regional with MAC)        Anesthesia Quick Evaluation

## 2011-10-26 NOTE — Transfer of Care (Signed)
Immediate Anesthesia Transfer of Care Note  Patient: [name]  Procedure(s) Performed:  TENDON SHEATH RELEASE/TENOLYSIS - tenosynovectomy removal superficialis slip right index finger  Patient Location: PACU  Anesthesia Type: MAC combined with regional for post-op pain  Level of Consciousness: awake, alert , oriented and patient cooperative  Airway & Oxygen Therapy: Patient Spontanous Breathing  Post-op Assessment: Report given to PACU RN, Post -op Vital signs reviewed and stable and Patient moving all extremities  Post vital signs: Reviewed and stable  Complications: No apparent anesthesia complications

## 2011-10-27 NOTE — Op Note (Signed)
NAME:  [name], [name]                 ACCOUNT NO.:  MEDICAL RECORD NO.:  [id number]  LOCATION:                                 FACILITY:  PHYSICIAN:  Cindee Salt, M.D.       DATE OF BIRTH:  [birth year]  DATE OF PROCEDURE:  10/26/2011 DATE OF DISCHARGE:                              OPERATIVE REPORT   PREOPERATIVE DIAGNOSIS:  Rheumatoid arthritis with recurrent stenosing tenosynovitis, index finger, right hand.  POSTOPERATIVE DIAGNOSIS:  Rheumatoid arthritis with recurrent stenosing tenosynovitis, index finger, right hand.  OPERATION:  Tenosynovectomy with removal of ulnar slip superficialis tendon, right index finger.  SURGEON:  Cindee Salt, MD  ASSISTANT:  Betha Loa, MD  ANESTHESIA:  IV regional with metacarpal block.  HISTORY:  The patient is a 65 year old female with a history of rheumatoid arthritis who underwent a tenosynovectomy of her index and middle fingers, right hand with excision of superficialis slip of her middle.  She has had recurrent triggering of her index, is desirous of proceeding to have a release along with excision of the ulnar slip of the superficialis to the middle finger.  PROCEDURE IN DETAIL:  The patient was brought to the operating room where IV regional anesthetic was carried out without difficulty.  She was prepped using ChloraPrep, supine position, and right arm free. Three minute dry time was allowed.  Time-out taken, confirmed patient procedure.  The old incision was used, carried down through subcutaneous tissue.  Bleeders were electrocauterized.  The palmar fascia was split proximally identifying the proximal aspect of the A1 pulley.  The superficialis had a very distal deposition.  An incision was made distally at A3 allowing visualization of the chiasm of camper.  The ulnar slip was then transected distally leaving the radial slip intact protecting the profundus tendon.  A separate window was made at the proximal aspect of A2  allowing the tendon to be brought through that opening.  Then further to the level of the proximal A1 pulley, this was then excised approximately in oblique manner.  The finger placed through a full range motion, no further triggering was noted.  The wound was copiously irrigated with saline.  Neurovascular structures were protected throughout the procedure.  The wound was then closed with interrupted 4-0 Vicryl repeat sutures.  A metacarpal block was given 0.25% Marcaine without epinephrine approximately 4 mL was used.  A sterile compressive dressing and splint applied.  On deflation of the tourniquet, all fingers immediately pinked.  She was taken to the recovery room for observation in satisfactory condition.          ______________________________ Cindee Salt, M.D.     GK/MEDQ  D:  10/26/2011  T:  10/27/2011  Job:  [id number]

## 2011-10-29 ENCOUNTER — Encounter (HOSPITAL_BASED_OUTPATIENT_CLINIC_OR_DEPARTMENT_OTHER): Payer: Self-pay | Admitting: Orthopedic Surgery

## 2011-11-05 ENCOUNTER — Ambulatory Visit: Payer: Self-pay | Admitting: Otolaryngology

## 2011-12-07 ENCOUNTER — Other Ambulatory Visit (HOSPITAL_COMMUNITY): Payer: Self-pay | Admitting: Internal Medicine

## 2011-12-07 DIAGNOSIS — Z1231 Encounter for screening mammogram for malignant neoplasm of breast: Secondary | ICD-10-CM

## 2012-01-04 ENCOUNTER — Ambulatory Visit (HOSPITAL_COMMUNITY)
Admission: RE | Admit: 2012-01-04 | Discharge: 2012-01-04 | Disposition: A | Discharge status: Home / Self Care | Payer: 59 | Source: Ambulatory Visit | Attending: Internal Medicine | Admitting: Internal Medicine

## 2012-01-04 DIAGNOSIS — Z1231 Encounter for screening mammogram for malignant neoplasm of breast: Secondary | ICD-10-CM | POA: Insufficient documentation

## 2012-01-10 ENCOUNTER — Encounter: Payer: Self-pay | Admitting: *Deleted

## 2012-01-10 NOTE — Progress Notes (Signed)
This encounter was created in error - please disregard.

## 2012-04-28 ENCOUNTER — Other Ambulatory Visit: Payer: Self-pay | Admitting: *Deleted

## 2012-07-23 IMAGING — US US ABDOMEN COMPLETE
1 series · 13 of 25 positions shown · non-contrast
Comparison: None.

CLINICAL DATA: 2-week history of abdominal pain.

COMPLETE ABDOMINAL ULTRASOUND 06/28/2011:

[Series 1: us abdomen complete · 0.32mm/px · 13 of 68 slices shown]
[im 1/68]
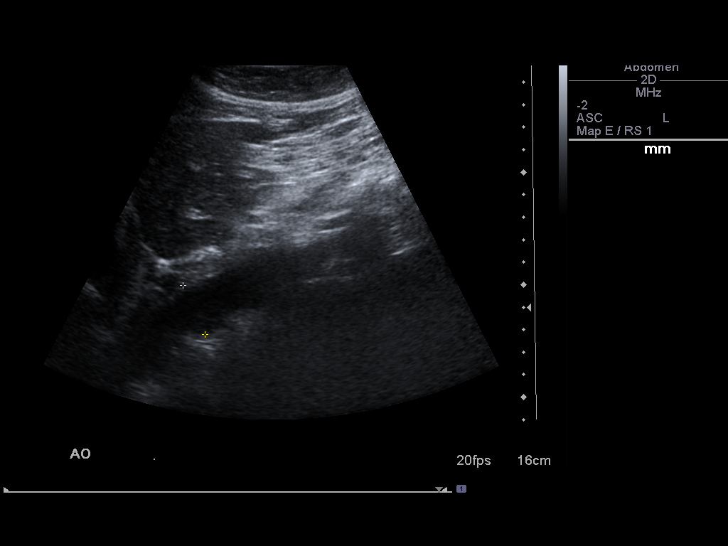
[im 6/68]
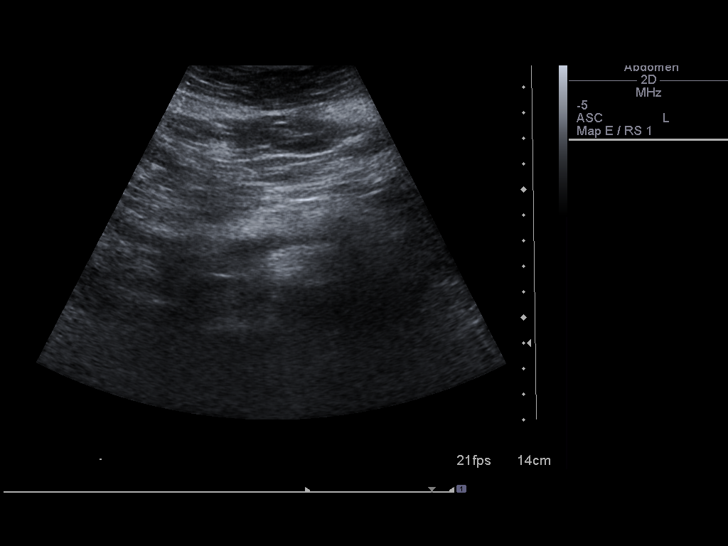
[im 12/68]
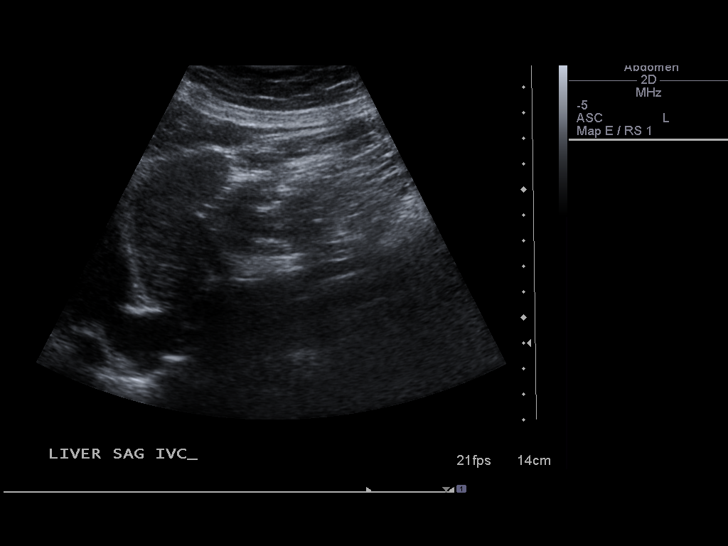
[im 17/68]
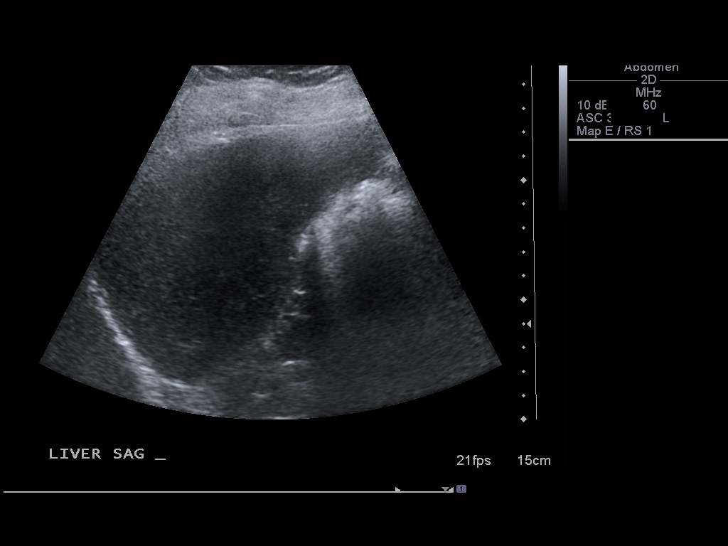
[im 23/68]
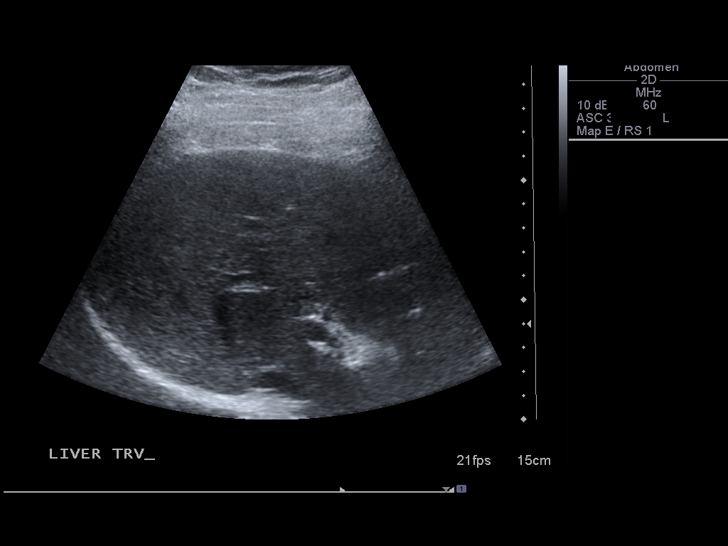
[im 28/68]
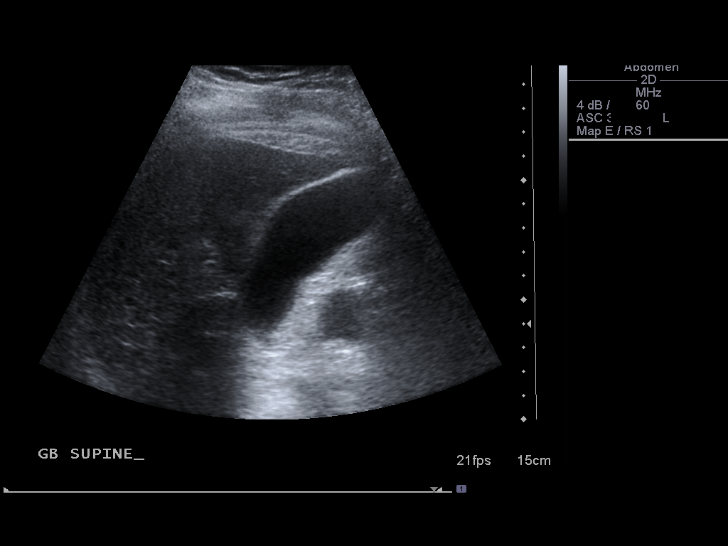
[im 34/68]
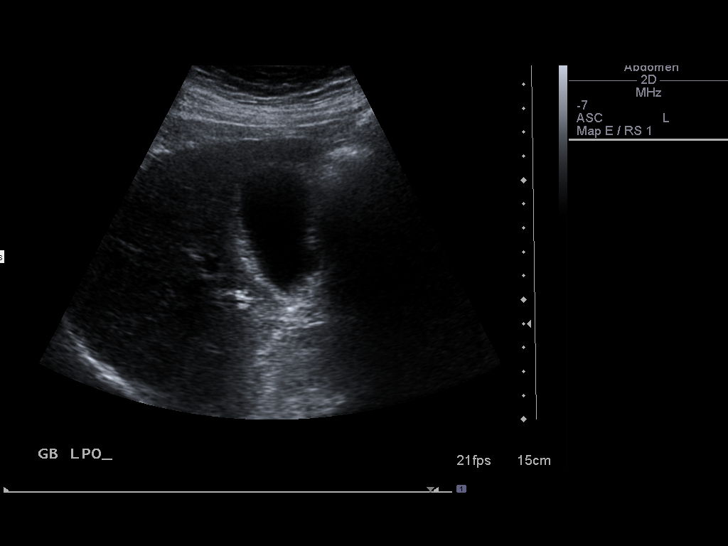
[im 40/68]
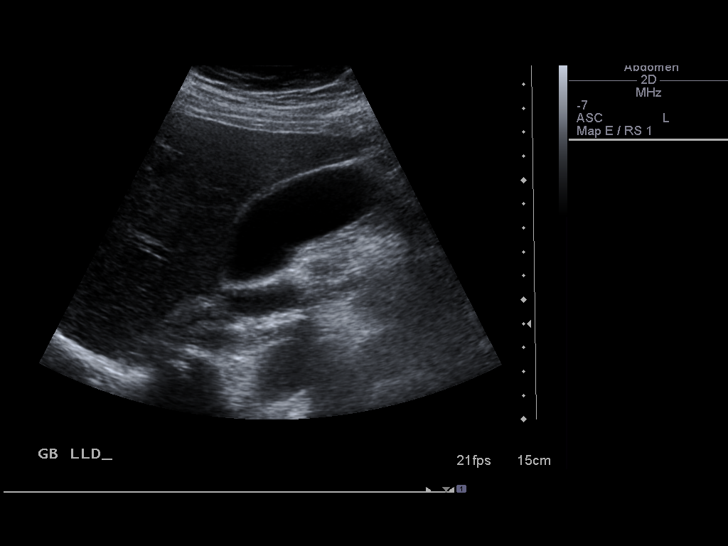
[im 45/68]
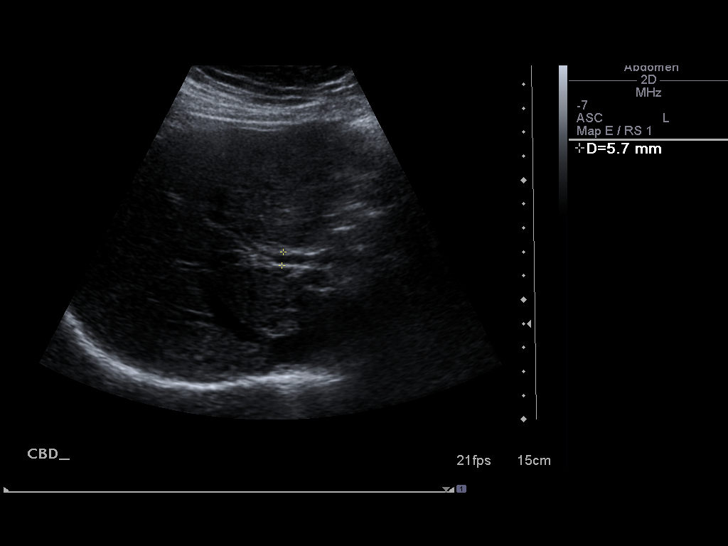
[im 51/68]
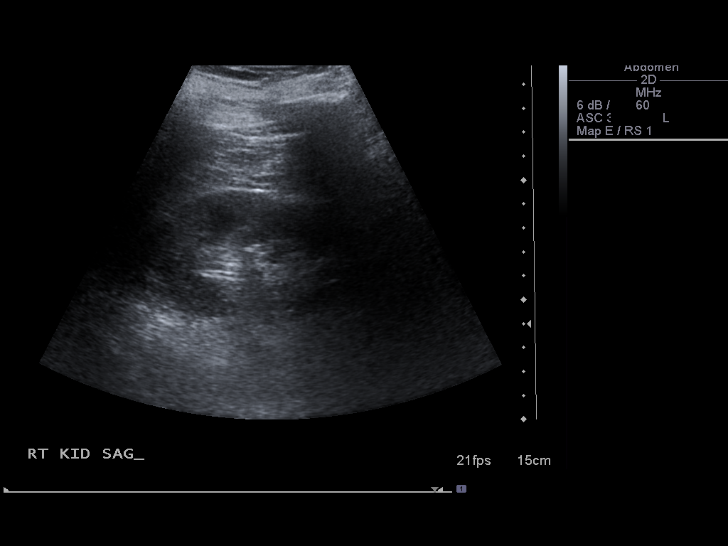
[im 56/68]
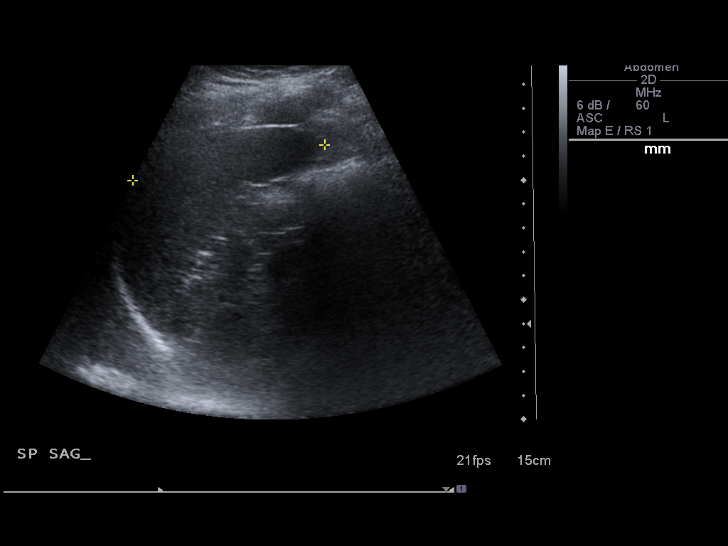
[im 62/68]
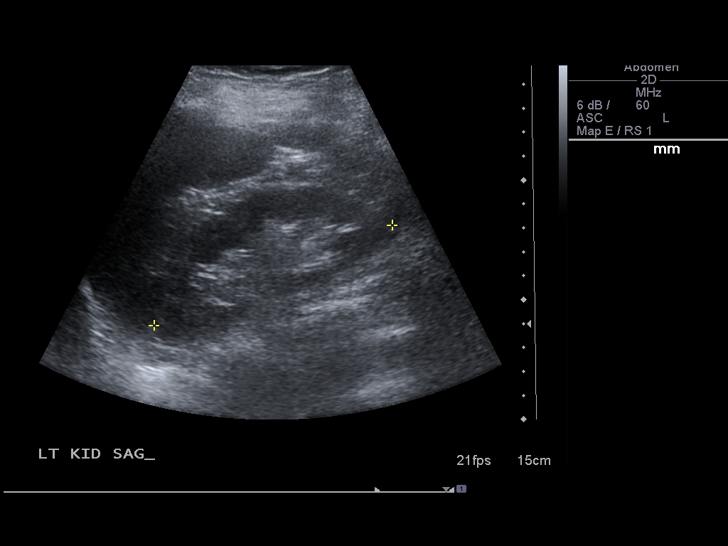
[im 68/68]
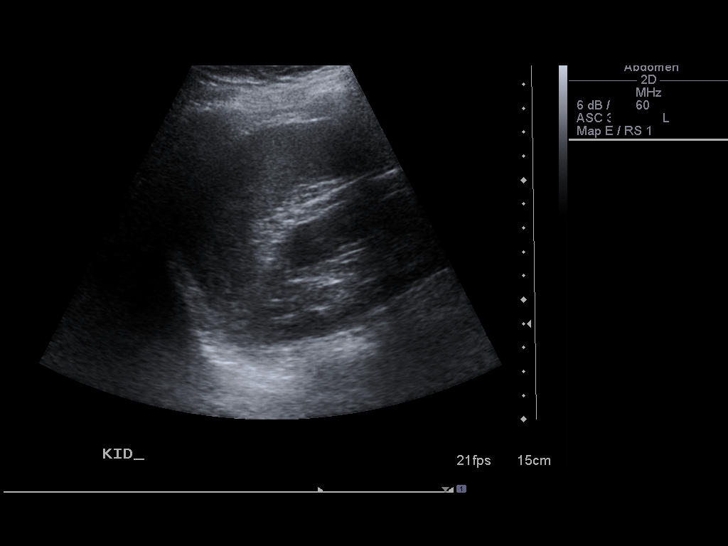

[13 of 25 positions shown; findings below may reference images not displayed]

FINDINGS: Gallbladder:  No shadowing gallstones or echogenic sludge.  No
gallbladder wall thickening or pericholecystic fluid.  Negative
sonographic Murphy's sign according to the ultrasound technologist.

Common bile duct:  Focally minimally dilated up to 8 mm centrally,
with normal caliber distally at 6 mm.

Liver:  Normal size and echotexture without focal parenchymal
abnormality.  Patent portal vein with hepatopetal flow.

IVC:  Patent.

Pancreas:  Normal size and echotexture without focal parenchymal
abnormality.

Spleen:  Normal size and echotexture without focal parenchymal
abnormality.

Right Kidney:  No hydronephrosis.  Well-preserved cortex.  No
shadowing calculi.  Normal size and parenchymal echotexture without
focal abnormalities. Approximately 10.4 cm length.

Left Kidney:  No hydronephrosis.  Well-preserved cortex.  No
shadowing calculi.  Normal size and parenchymal echotexture without
focal abnormalities. Approximately 10.8 cm length.

Abdominal aorta:  Normal in caliber throughout its visualized
course in the abdomen with mild to moderate atherosclerosis.
IMPRESSION: 1.  No abnormalities identified to explain abdominal pain.
2.  Mild aortic atherosclerosis without aneurysm.
3.  No significant abnormalities otherwise.

## 2012-09-12 ENCOUNTER — Other Ambulatory Visit: Payer: Self-pay | Admitting: Internal Medicine

## 2012-09-12 DIAGNOSIS — I10 Essential (primary) hypertension: Secondary | ICD-10-CM

## 2012-09-14 ENCOUNTER — Ambulatory Visit
Admission: RE | Admit: 2012-09-14 | Discharge: 2012-09-14 | Disposition: A | Discharge status: Home / Self Care | Payer: Medicare HMO | Source: Ambulatory Visit | Attending: Internal Medicine | Admitting: Internal Medicine

## 2012-09-14 DIAGNOSIS — I10 Essential (primary) hypertension: Secondary | ICD-10-CM

## 2012-12-08 ENCOUNTER — Other Ambulatory Visit (HOSPITAL_COMMUNITY): Payer: Self-pay | Admitting: Internal Medicine

## 2012-12-08 DIAGNOSIS — Z1231 Encounter for screening mammogram for malignant neoplasm of breast: Secondary | ICD-10-CM

## 2013-01-04 ENCOUNTER — Ambulatory Visit (HOSPITAL_COMMUNITY)
Admission: RE | Admit: 2013-01-04 | Discharge: 2013-01-04 | Disposition: A | Discharge status: Home / Self Care | Payer: Medicare HMO | Source: Ambulatory Visit | Attending: Internal Medicine | Admitting: Internal Medicine

## 2013-01-04 DIAGNOSIS — Z1231 Encounter for screening mammogram for malignant neoplasm of breast: Secondary | ICD-10-CM | POA: Insufficient documentation

## 2013-02-26 ENCOUNTER — Encounter: Payer: Self-pay | Admitting: *Deleted

## 2013-02-26 ENCOUNTER — Telehealth: Payer: Self-pay | Admitting: Gastroenterology

## 2013-02-26 NOTE — Telephone Encounter (Signed)
Pt scheduled to see Doug Sou PA Wednesday 02/28/13@10 :45am. Records to be faxed by office. Pt having reflux and has h/o H Pylori infection in the past. Aram Beecham to notify pt of appt date and time.

## 2013-02-28 ENCOUNTER — Encounter: Payer: Self-pay | Admitting: Gastroenterology

## 2013-02-28 ENCOUNTER — Ambulatory Visit (INDEPENDENT_AMBULATORY_CARE_PROVIDER_SITE_OTHER): Payer: Medicare HMO | Admitting: Gastroenterology

## 2013-02-28 VITALS — BP 120/70 | HR 72 | Ht 61.5 in | Wt 174.0 lb

## 2013-02-28 DIAGNOSIS — R109 Unspecified abdominal pain: Secondary | ICD-10-CM

## 2013-02-28 DIAGNOSIS — R140 Abdominal distension (gaseous): Secondary | ICD-10-CM

## 2013-02-28 DIAGNOSIS — K219 Gastro-esophageal reflux disease without esophagitis: Secondary | ICD-10-CM

## 2013-02-28 DIAGNOSIS — R142 Eructation: Secondary | ICD-10-CM

## 2013-02-28 DIAGNOSIS — R141 Gas pain: Secondary | ICD-10-CM

## 2013-02-28 DIAGNOSIS — Z1211 Encounter for screening for malignant neoplasm of colon: Secondary | ICD-10-CM

## 2013-02-28 MED ORDER — PANTOPRAZOLE SODIUM 40 MG PO TBEC: 40.0000 mg | DELAYED_RELEASE_TABLET | Freq: Every day | ORAL | Status: DC

## 2013-02-28 MED ORDER — NA SULFATE-K SULFATE-MG SULF 17.5-3.13-1.6 GM/177ML PO SOLN: 1.0000 | Freq: Once | ORAL | Status: DC

## 2013-02-28 NOTE — Progress Notes (Signed)
Reviewed and agree with management. Selah Zelman D. Jhane Lorio, M.D., FACG  

## 2013-02-28 NOTE — Progress Notes (Signed)
02/28/2013 [name] [id number] [birth year]   History of Present Illness:  Patient is a pleasant 67 year old female who is a patient of Dr. Marzetta Board.  She presents to our office today with a few different complaints.  First, she reports increased belching over the past several months, but much worse recently.  She has been taking prilosec 40 mg daily for quite some time; she saw her PCP last week and they increased it to BID, but has not made a difference so far.  She had a EGD in 11/2010 at which time she was found to have gastritis only.  She did have Hpylori in the past and was treated.  She says that PCP did labs for Hpylori last week and said that she was positive for past Hpylori infection but does not have active infection.  Takes ibuprofen at least once a day and had a ulcer in the past.  Also reports diffuse abdominal bloating and discomfort.  We do not have the results of the labs that she had drawn by her PCP but are requesting those.  She says that she has been seeing mucus in her stool recently.  Has two BM's a day and have been loose than normal.  No colonoscopy since 2002.  Denies rectal bleeding.    Current Medications, Allergies, Past Medical History, Past Surgical History, Family History and Social History were reviewed in [employer] record.   Physical Exam: BP 120/70  Pulse 72  Ht 5' 1.5" (1.562 m)  Wt 174 lb (78.926 kg)  BMI 32.35 kg/m2 General: Well developed white female in no acute distress Head: Normocephalic and atraumatic Eyes:  sclerae anicteric, conjunctiva pink  Ears: Normal auditory acuity Lungs: Clear throughout to auscultation Heart: Regular rate and rhythm Abdomen: Soft, non-distended. No masses, no hepatomegaly. Normal bowel sounds.  Mild epigastric TTP without R/R/G. Musculoskeletal: Symmetrical with no gross deformities  Extremities: No edema  Neurological: Alert oriented x 4, grossly nonfocal Psychological:  Alert and  cooperative. Normal mood and affect  Assessment and Recommendations: -Belching and GERD:  Will discontinue her omeprazole and begin pantoprazole 40 mg daily.   -Abdominal pain and bloating -Screening colonoscopy:  Schedule colonoscopy with Dr. Arlyce Dice.  The risks, benefits, and alternatives were discussed with the patient and she consents to proceed.   **Will request lab results from her PCP's office.

## 2013-02-28 NOTE — Patient Instructions (Addendum)

## 2013-03-08 ENCOUNTER — Encounter: Payer: Self-pay | Admitting: Gastroenterology

## 2013-03-08 ENCOUNTER — Ambulatory Visit (AMBULATORY_SURGERY_CENTER): Payer: Medicare HMO | Admitting: Gastroenterology

## 2013-03-08 VITALS — BP 146/83 | HR 57 | Temp 97.1°F | Resp 30 | Ht 61.0 in | Wt 174.0 lb

## 2013-03-08 DIAGNOSIS — D126 Benign neoplasm of colon, unspecified: Secondary | ICD-10-CM

## 2013-03-08 DIAGNOSIS — Z1211 Encounter for screening for malignant neoplasm of colon: Secondary | ICD-10-CM

## 2013-03-08 DIAGNOSIS — R143 Flatulence: Secondary | ICD-10-CM

## 2013-03-08 DIAGNOSIS — R109 Unspecified abdominal pain: Secondary | ICD-10-CM

## 2013-03-08 MED ORDER — SODIUM CHLORIDE 0.9 % IV SOLN: 500.0000 mL | INTRAVENOUS | Status: DC

## 2013-03-08 NOTE — Patient Instructions (Addendum)
Discharge instructions given with verbal understanding. Handout on polyps given. Resume previous medications. YOU HAD AN ENDOSCOPIC PROCEDURE TODAY AT THE [facility]: Refer to the procedure report that was given to you for any specific questions about what was found during the examination.  If the procedure report does not answer your questions, please call your gastroenterologist to clarify.  If you requested that your care partner not be given the details of your procedure findings, then the procedure report has been included in a sealed envelope for you to review at your convenience later.  YOU SHOULD EXPECT: Some feelings of bloating in the abdomen. Passage of more gas than usual.  Walking can help get rid of the air that was put into your GI tract during the procedure and reduce the bloating. If you had a lower endoscopy (such as a colonoscopy or flexible sigmoidoscopy) you may notice spotting of blood in your stool or on the toilet paper. If you underwent a bowel prep for your procedure, then you may not have a normal bowel movement for a few days.  DIET: Your first meal following the procedure should be a light meal and then it is ok to progress to your normal diet.  A half-sandwich or bowl of soup is an example of a good first meal.  Heavy or fried foods are harder to digest and may make you feel nauseous or bloated.  Likewise meals heavy in dairy and vegetables can cause extra gas to form and this can also increase the bloating.  Drink plenty of fluids but you should avoid alcoholic beverages for 24 hours.  ACTIVITY: Your care partner should take you home directly after the procedure.  You should plan to take it easy, moving slowly for the rest of the day.  You can resume normal activity the day after the procedure however you should NOT DRIVE or use heavy machinery for 24 hours (because of the sedation medicines used during the test).    SYMPTOMS TO REPORT IMMEDIATELY: A  gastroenterologist can be reached at any hour.  During normal business hours, 8:30 AM to 5:00 PM Monday through Friday, call [phone].  After hours and on weekends, please call the GI answering service at [phone] who will take a message and have the physician on call contact you.   Following lower endoscopy (colonoscopy or flexible sigmoidoscopy):  Excessive amounts of blood in the stool  Significant tenderness or worsening of abdominal pains  Swelling of the abdomen that is new, acute  Fever of 100F or higher  FOLLOW UP: If any biopsies were taken you will be contacted by phone or by letter within the next 1-3 weeks.  Call your gastroenterologist if you have not heard about the biopsies in 3 weeks.  Our staff will call the home number listed on your records the next business day following your procedure to check on you and address any questions or concerns that you may have at that time regarding the information given to you following your procedure. This is a courtesy call and so if there is no answer at the home number and we have not heard from you through the emergency physician on call, we will assume that you have returned to your regular daily activities without incident.  SIGNATURES/CONFIDENTIALITY: You and/or your care partner have signed paperwork which will be entered into your electronic medical record.  These signatures attest to the fact that that the information above on your After Visit Summary has   been reviewed and is understood.  Full responsibility of the confidentiality of this discharge information lies with you and/or your care-partner. 

## 2013-03-08 NOTE — Progress Notes (Signed)
Patient did not experience any of the following events: a burn prior to discharge; a fall within the facility; wrong site/side/patient/procedure/implant event; or a hospital transfer or hospital admission upon discharge from the facility. (G8907) Patient did not have preoperative order for IV antibiotic SSI prophylaxis. (G8918)  

## 2013-03-08 NOTE — Op Note (Signed)
[facility] [address] [place], [address]   COLONOSCOPY PROCEDURE REPORT  PATIENT: [name], [name].  MR#: [id number] BIRTHDATE: 1946/05/13 , 66  yrs. old GENDER: Female ENDOSCOPIST: Louis Meckel, MD REFERRED BY: PROCEDURE DATE:  03/08/2013 PROCEDURE:   Colonoscopy with snare polypectomy ASA CLASS:   Class II INDICATIONS:average risk screening. MEDICATIONS: MAC sedation, administered by CRNA and Propofol (Diprivan) 240 mg IV  DESCRIPTION OF PROCEDURE:   After the risks benefits and alternatives of the procedure were thoroughly explained, informed consent was obtained.  A digital rectal exam revealed no abnormalities of the rectum.   The LB [id number]  endoscope was introduced through the anus and advanced to the cecum, which was identified by both the appendix and ileocecal valve. No adverse events experienced.   The quality of the prep was excellent using Suprep  The instrument was then slowly withdrawn as the colon was fully examined.      COLON FINDINGS: A flat polyp measuring 6 mm in size was found in the transverse colon.  A polypectomy was performed with a cold snare. The resection was complete and the polyp tissue was completely retrieved.   The colon mucosa was otherwise normal.  Retroflexed views revealed no abnormalities. The time to cecum=5 minutes 56 seconds.  Withdrawal time=12 minutes 28 seconds.  The scope was withdrawn and the procedure completed. COMPLICATIONS: There were no complications.  ENDOSCOPIC IMPRESSION: 1.   Flat polyp measuring 6 mm in size was found in the transverse colon; polypectomy was performed with a cold snare 2.   The colon mucosa was otherwise normal  RECOMMENDATIONS: If the polyp(s) removed today are proven to be adenomatous (pre-cancerous) polyps, you will need a repeat colonoscopy in 5 years.  Otherwise you should continue to follow colorectal cancer screening guidelines for "routine risk"  patients with colonoscopy in 10 years.  You will receive a letter within 1-2 weeks with the results of your biopsy as well as final recommendations.  Please call my office if you have not received a letter after 3 weeks.   eSigned:  Louis Meckel, MD 03/08/2013 11:09 AM   cc: Etta Grandchild, MD

## 2013-03-08 NOTE — Progress Notes (Signed)
Stable to RR 

## 2013-03-08 NOTE — Progress Notes (Signed)
Called to room to assist during endoscopic procedure.  Patient ID and intended procedure confirmed with present staff. Received instructions for my participation in the procedure from the performing physician.  

## 2013-03-09 ENCOUNTER — Telehealth: Payer: Self-pay | Admitting: *Deleted

## 2013-03-09 NOTE — Telephone Encounter (Signed)
No answer, number identifier, message left to call if any questions or concerns. 

## 2013-03-16 ENCOUNTER — Telehealth: Payer: Self-pay | Admitting: Internal Medicine

## 2013-03-16 ENCOUNTER — Encounter: Payer: Self-pay | Admitting: Gastroenterology

## 2013-03-16 NOTE — Telephone Encounter (Signed)
[name] from Dr. Kathryne Sharper Office requested last 2 Office Visits on 03/16/2013  Faxed 4 pages 02/28/13 Office Note and 03/08/2013 Colon Report on 03/16/2013  asw

## 2013-04-18 ENCOUNTER — Other Ambulatory Visit (INDEPENDENT_AMBULATORY_CARE_PROVIDER_SITE_OTHER): Payer: Medicare HMO

## 2013-04-18 ENCOUNTER — Encounter: Payer: Self-pay | Admitting: Gastroenterology

## 2013-04-18 ENCOUNTER — Ambulatory Visit (INDEPENDENT_AMBULATORY_CARE_PROVIDER_SITE_OTHER): Payer: Medicare HMO | Admitting: Gastroenterology

## 2013-04-18 VITALS — BP 140/70 | HR 66 | Ht 61.0 in | Wt 177.6 lb

## 2013-04-18 DIAGNOSIS — D649 Anemia, unspecified: Secondary | ICD-10-CM

## 2013-04-18 DIAGNOSIS — Z860100 Personal history of colon polyps, unspecified: Secondary | ICD-10-CM | POA: Insufficient documentation

## 2013-04-18 DIAGNOSIS — R1011 Right upper quadrant pain: Secondary | ICD-10-CM

## 2013-04-18 LAB — CBC WITH DIFFERENTIAL/PLATELET
Basophils Absolute: 0 10*3/uL (ref 0.0–0.1)
Eosinophils Absolute: 0.2 10*3/uL (ref 0.0–0.7)
HCT: 36.2 % (ref 36.0–46.0)
Lymphs Abs: 1.4 10*3/uL (ref 0.7–4.0)
MCHC: 33.3 g/dL (ref 30.0–36.0)
MCV: 94 fl (ref 78.0–100.0)
Monocytes Absolute: 0.4 10*3/uL (ref 0.1–1.0)
Monocytes Relative: 8.8 % (ref 3.0–12.0)
Platelets: 177 10*3/uL (ref 150.0–400.0)
RDW: 14.6 % (ref 11.5–14.6)

## 2013-04-18 LAB — HEPATIC FUNCTION PANEL: Total Bilirubin: 0.6 mg/dL (ref 0.3–1.2)

## 2013-04-18 LAB — AMYLASE: Amylase: 74 U/L (ref 27–131)

## 2013-04-18 MED ORDER — HYOSCYAMINE SULFATE 0.125 MG SL SUBL: 0.2500 mg | SUBLINGUAL_TABLET | SUBLINGUAL | Status: DC | PRN

## 2013-04-18 NOTE — Assessment & Plan Note (Addendum)
Abdominal pain could be due to ulcer or nonulcer dyspepsia. Note patient takes Advil at least once daily. The patient does state that pain is reminiscent of her gallbladder pain raisesing the question of a recurrent bile duct stone.  Recommendations #1 check LFTs, amylase and lipase #2 trial of hyomax #3 if LFTs are normal and symptoms continue despite hyomax I will proceed with upper endoscopy

## 2013-04-18 NOTE — Assessment & Plan Note (Signed)
Plan follow-up colonoscopy 2019 

## 2013-04-18 NOTE — Patient Instructions (Addendum)
Follow up in 3 weeks You will go to the basement for labs today

## 2013-04-18 NOTE — Progress Notes (Signed)
History of Present Illness: The patient has returned for evaluation of abdominal pain.  In 2012 she underwent ERCP and sphincterotomy with stone extraction following a laparoscopic cholecystectomy. Over the past few weeks she's been complaining of poorly described intermittent pain in the right upper quadrant that may radiate to the back and to the midepigastrium. It is slightly worsened with eating. Pain is spontaneous and may last hours at a time. It is reminiscent of her gallbladder pain. She takes at least one Advil daily. She denies pyrosis, nausea, jaundice or fevers. Colonoscopy last month for screening demonstrated a small adenoma which was removed.    Past Medical History  Diagnosis Date  . Hyperlipidemia   . Hypothyroid   . Anemia     Hx of   . H. pylori infection     Hx of   . GERD (gastroesophageal reflux disease)   . PUD (peptic ulcer disease)   . Calculus of bile duct without mention of cholecystitis or obstruction   . Arthritis   . Rheumatoid arthritis(714.0)   . Hypertension   . Cataract   . Heart murmur    Past Surgical History  Procedure Laterality Date  . Vaginal hysterectomy      ovaries not removed  . Carpal tunnel release    . Cystectomy      left breast  . Av fistula repair    . Bladder suspension    . Pilonidal cyst excision    . Trigger finger release    . Bunionectomy    . Ercp  09/29/2011    Procedure: ENDOSCOPIC RETROGRADE CHOLANGIOPANCREATOGRAPHY (ERCP);  Surgeon: Louis Meckel, MD;  Location: [facility];  Service: Endoscopy;  Laterality: N/A;  . Tonsillectomy    . Cholecystectomy  09/07/2011  . Tenolysis  10/26/2011    Procedure: TENDON SHEATH RELEASE/TENOLYSIS;  Surgeon: Nicki Reaper, MD;  Location:  SURGERY CENTER;  Service: Orthopedics;  Laterality: Right;  tenosynovectomy removal superficialis slip right index finger   family history includes Colon cancer in her maternal grandfather; Heart attack in her brother and father; Lung  cancer in her mother; Prostate cancer in her paternal grandfather; and Stomach cancer in her father. Current Outpatient Prescriptions  Medication Sig Dispense Refill  . ALPRAZolam (XANAX) 1 MG tablet Take 1 mg by mouth daily.        Marland Kitchen aspirin 81 MG tablet Take 81 mg by mouth daily.        Marland Kitchen atenolol (TENORMIN) 25 MG tablet Take 25 mg by mouth daily.      . Cholecalciferol (VITAMIN D PO) Take by mouth daily.        . Coenzyme Q10 (COQ10) 400 MG CAPS Take 1 capsule by mouth daily.      . Cyanocobalamin (VITAMIN B-12 PO) Take by mouth.        . folic acid (FOLVITE) 1 MG tablet Take 2 mg by mouth daily.       . hydrochlorothiazide (HYDRODIURIL) 25 MG tablet Take 25 mg by mouth daily.       . hydroxychloroquine (PLAQUENIL) 200 MG tablet Take by mouth daily. Take 1 tab AM and PM      . Ibuprofen (ADVIL PO) Take 800 mg by mouth as needed.        . leflunomide (ARAVA) 20 MG tablet Take 20 mg by mouth daily.       Marland Kitchen LEVOTHYROXINE SODIUM PO Take 1 tablet by mouth.        . losartan (  COZAAR) 100 MG tablet Take 100 mg by mouth daily.       Marland Kitchen MAGNESIUM PO Take by mouth daily.        . Methotrexate Sodium (METHOTREXATE PO) Take by mouth. Takes 17.5 mg once a week      . Misc Natural Products (OSTEO BI-FLEX JOINT SHIELD PO) Take 1 tablet by mouth 2 (two) times daily.       . Multiple Vitamins-Minerals (MULTI COMPLETE PO) Take by mouth.        Marland Kitchen omeprazole (PRILOSEC) 40 MG capsule Take 40 mg by mouth daily. Takes in the afternoon      . pantoprazole (PROTONIX) 40 MG tablet Take 1 tablet (40 mg total) by mouth daily.  90 tablet  3  . pravastatin (PRAVACHOL) 10 MG tablet Take 10 mg by mouth daily.      Marland Kitchen terbinafine (LAMISIL) 250 MG tablet        No current facility-administered medications for this visit.   Allergies as of 04/18/2013  . (No Known Allergies)    reports that she quit smoking about 17 years ago. She has never used smokeless tobacco. She reports that  drinks alcohol. She reports that she  does not use illicit drugs.     Review of Systems: Pertinent positive and negative review of systems were noted in the above HPI section. All other review of systems were otherwise negative.  Vital signs were reviewed in today's medical record Physical Exam: General: Well developed , well nourished, no acute distress Skin: anicteric Head: Normocephalic and atraumatic Eyes:  sclerae anicteric, EOMI Ears: Normal auditory acuity Mouth: No deformity or lesions Neck: Supple, no masses or thyromegaly Lungs: Clear throughout to auscultation Heart: Regular rate and rhythm; no murmurs, rubs or bruits Abdomen: Soft, non tender and non distended. No masses, hepatosplenomegaly or hernias noted. Normal Bowel sounds Rectal:deferred Musculoskeletal: Symmetrical with no gross deformities  Skin: No lesions on visible extremities Pulses:  Normal pulses noted Extremities: No clubbing, cyanosis, edema or deformities noted Neurological: Alert oriented x 4, grossly nonfocal Cervical Nodes:  No significant cervical adenopathy Inguinal Nodes: No significant inguinal adenopathy Psychological:  Alert and cooperative. Normal mood and affect

## 2013-04-23 ENCOUNTER — Other Ambulatory Visit: Payer: Self-pay | Admitting: Gastroenterology

## 2013-04-26 ENCOUNTER — Ambulatory Visit (HOSPITAL_COMMUNITY)
Admission: RE | Admit: 2013-04-26 | Discharge: 2013-04-26 | Disposition: A | Discharge status: Home / Self Care | Payer: Medicare HMO | Source: Ambulatory Visit | Attending: Gastroenterology | Admitting: Gastroenterology

## 2013-04-26 DIAGNOSIS — K7689 Other specified diseases of liver: Secondary | ICD-10-CM | POA: Insufficient documentation

## 2013-04-26 DIAGNOSIS — R945 Abnormal results of liver function studies: Secondary | ICD-10-CM | POA: Insufficient documentation

## 2013-04-30 ENCOUNTER — Encounter: Payer: Self-pay | Admitting: Gastroenterology

## 2013-04-30 ENCOUNTER — Ambulatory Visit (AMBULATORY_SURGERY_CENTER): Payer: Medicare HMO | Admitting: *Deleted

## 2013-04-30 VITALS — Ht 61.5 in | Wt 174.4 lb

## 2013-04-30 DIAGNOSIS — R1011 Right upper quadrant pain: Secondary | ICD-10-CM

## 2013-05-01 ENCOUNTER — Encounter: Payer: Self-pay | Admitting: Gastroenterology

## 2013-05-01 ENCOUNTER — Ambulatory Visit (AMBULATORY_SURGERY_CENTER): Payer: Commercial Managed Care - HMO | Admitting: Gastroenterology

## 2013-05-01 ENCOUNTER — Other Ambulatory Visit (HOSPITAL_COMMUNITY): Payer: Self-pay | Admitting: Internal Medicine

## 2013-05-01 VITALS — BP 113/67 | HR 51 | Temp 98.0°F | Resp 14 | Ht 61.5 in | Wt 174.0 lb

## 2013-05-01 DIAGNOSIS — K253 Acute gastric ulcer without hemorrhage or perforation: Secondary | ICD-10-CM

## 2013-05-01 DIAGNOSIS — K2960 Other gastritis without bleeding: Secondary | ICD-10-CM

## 2013-05-01 DIAGNOSIS — R1011 Right upper quadrant pain: Secondary | ICD-10-CM

## 2013-05-01 DIAGNOSIS — Z780 Asymptomatic menopausal state: Secondary | ICD-10-CM

## 2013-05-01 MED ORDER — SODIUM CHLORIDE 0.9 % IV SOLN: 500.0000 mL | INTRAVENOUS | Status: DC

## 2013-05-01 NOTE — Progress Notes (Signed)
Lidocaine-40mg IV prior to Propofol InductionPropofol given over incremental dosages 

## 2013-05-01 NOTE — Patient Instructions (Addendum)

## 2013-05-01 NOTE — Op Note (Signed)
[facility] [address] [place], [address]   ENDOSCOPY PROCEDURE REPORT  PATIENT: Tammie, Vazquez.  MR#: [id number] BIRTHDATE: [birth year] , 66  yrs. old GENDER: Female ENDOSCOPIST: Louis Meckel, MD REFERRED BY:  Lucky Cowboy, M.D. PROCEDURE DATE:  05/01/2013 PROCEDURE:  EGD w/ biopsy ASA CLASS:     Class II INDICATIONS:  Epigastric pain. MEDICATIONS: MAC sedation, administered by CRNA, propofol (Diprivan) 100mg  IV, and Simethicone 0.6cc PO TOPICAL ANESTHETIC: Cetacaine Spray  DESCRIPTION OF PROCEDURE: After the risks benefits and alternatives of the procedure were thoroughly explained, informed consent was obtained.  The LB [id number] endoscope was introduced through the mouth and advanced to the third portion of the duodenum. Without limitations.  The instrument was slowly withdrawn as the mucosa was fully examined.      A partially healed gastric ulcer was seen in the prepyloric antrum. Surrounding mucosa was slightly edematous.  Multiple biopsies were taken.   The remainder of the upper endoscopy exam was otherwise normal.  Retroflexed views revealed no abnormalities.     The scope was then withdrawn from the patient and the procedure completed.  COMPLICATIONS: There were no complications. ENDOSCOPIC IMPRESSION: 1.   A partially healed gastric ulcer was seen in the prepyloric antrum.  Surrounding mucosa was slightly edematous.  Multiple biopsies were taken. 2.   The remainder of the upper endoscopy exam was otherwise normal  RECOMMENDATIONS: 1.  Avoid NSAIDS 2.  Continue PPI - protonix 3.  OV 4 weeks  REPEAT EXAM:  eSigned:  Louis Meckel, MD 05/01/2013 9:56 AM   CC:

## 2013-05-01 NOTE — Progress Notes (Signed)
Called to room to assist during endoscopic procedure.  Patient ID and intended procedure confirmed with present staff. Received instructions for my participation in the procedure from the performing physician. ewm 

## 2013-05-01 NOTE — Progress Notes (Signed)
Patient did not have preoperative order for IV antibiotic SSI prophylaxis. (G8918)  Patient did not experience any of the following events: a burn prior to discharge; a fall within the facility; wrong site/side/patient/procedure/implant event; or a hospital transfer or hospital admission upon discharge from the facility. (G8907)  

## 2013-05-02 ENCOUNTER — Telehealth: Payer: Self-pay | Admitting: *Deleted

## 2013-05-02 NOTE — Telephone Encounter (Signed)
  Follow up Call-  Call back number 05/01/2013 03/08/2013  Post procedure Call Back phone  # [phone]  Permission to leave phone message Yes Yes     Patient questions:  Do you have a fever, pain , or abdominal swelling? no Pain Score  0 *  Have you tolerated food without any problems? yes  Have you been able to return to your normal activities? yes  Do you have any questions about your discharge instructions: Diet   no Medications  no Follow up visit  no  Do you have questions or concerns about your Care? no  Actions: * If pain score is 4 or above: No action needed, pain <4.

## 2013-05-07 ENCOUNTER — Other Ambulatory Visit (HOSPITAL_COMMUNITY): Payer: Self-pay | Admitting: Internal Medicine

## 2013-05-07 ENCOUNTER — Ambulatory Visit (HOSPITAL_COMMUNITY)
Admission: RE | Admit: 2013-05-07 | Discharge: 2013-05-07 | Disposition: A | Discharge status: Home / Self Care | Payer: Medicare HMO | Source: Ambulatory Visit | Attending: Internal Medicine | Admitting: Internal Medicine

## 2013-05-07 DIAGNOSIS — Z780 Asymptomatic menopausal state: Secondary | ICD-10-CM | POA: Insufficient documentation

## 2013-05-07 DIAGNOSIS — Z13820 Encounter for screening for osteoporosis: Secondary | ICD-10-CM | POA: Insufficient documentation

## 2013-05-14 ENCOUNTER — Encounter: Payer: Self-pay | Admitting: Gastroenterology

## 2013-05-28 ENCOUNTER — Ambulatory Visit: Payer: Medicare HMO | Admitting: Gastroenterology

## 2013-06-13 ENCOUNTER — Other Ambulatory Visit: Payer: Self-pay

## 2013-09-04 ENCOUNTER — Encounter: Payer: Self-pay | Admitting: Internal Medicine

## 2013-09-11 ENCOUNTER — Encounter: Payer: Self-pay | Admitting: *Deleted

## 2013-09-11 ENCOUNTER — Ambulatory Visit: Payer: Commercial Managed Care - HMO | Admitting: *Deleted

## 2013-09-11 VITALS — BP 124/60 | HR 60 | Temp 97.9°F | Resp 18 | Wt 174.2 lb

## 2013-09-11 DIAGNOSIS — E039 Hypothyroidism, unspecified: Secondary | ICD-10-CM

## 2013-09-11 LAB — TSH: TSH: 24.162 u[IU]/mL — ABNORMAL HIGH (ref 0.350–4.500)

## 2013-09-11 NOTE — Progress Notes (Signed)
levothyroxine 150 mcg 1/2 tab qd

## 2013-09-13 ENCOUNTER — Other Ambulatory Visit: Payer: Self-pay

## 2013-10-22 ENCOUNTER — Encounter: Payer: Self-pay | Admitting: Physician Assistant

## 2013-10-22 ENCOUNTER — Ambulatory Visit (INDEPENDENT_AMBULATORY_CARE_PROVIDER_SITE_OTHER): Payer: Medicare HMO | Admitting: Physician Assistant

## 2013-10-22 VITALS — BP 120/72 | HR 63 | Temp 97.3°F | Resp 16 | Ht 61.5 in | Wt 169.0 lb

## 2013-10-22 DIAGNOSIS — D638 Anemia in other chronic diseases classified elsewhere: Secondary | ICD-10-CM

## 2013-10-22 DIAGNOSIS — E785 Hyperlipidemia, unspecified: Secondary | ICD-10-CM

## 2013-10-22 DIAGNOSIS — E782 Mixed hyperlipidemia: Secondary | ICD-10-CM

## 2013-10-22 DIAGNOSIS — R55 Syncope and collapse: Secondary | ICD-10-CM

## 2013-10-22 DIAGNOSIS — I10 Essential (primary) hypertension: Secondary | ICD-10-CM

## 2013-10-22 LAB — HEPATIC FUNCTION PANEL
Albumin: 4.4 g/dL (ref 3.5–5.2)
Alkaline Phosphatase: 72 U/L (ref 39–117)
Indirect Bilirubin: 0.6 mg/dL (ref 0.0–0.9)
Total Bilirubin: 0.8 mg/dL (ref 0.3–1.2)

## 2013-10-22 LAB — TSH: TSH: 0.711 u[IU]/mL (ref 0.350–4.500)

## 2013-10-22 LAB — CBC WITH DIFFERENTIAL/PLATELET
Basophils Absolute: 0.1 10*3/uL (ref 0.0–0.1)
Basophils Relative: 2 % — ABNORMAL HIGH (ref 0–1)
HCT: 39.4 % (ref 36.0–46.0)
Lymphocytes Relative: 25 % (ref 12–46)
MCHC: 33 g/dL (ref 30.0–36.0)
Monocytes Absolute: 0.4 10*3/uL (ref 0.1–1.0)
Neutro Abs: 1.9 10*3/uL (ref 1.7–7.7)
Neutrophils Relative %: 55 % (ref 43–77)
Platelets: 148 10*3/uL — ABNORMAL LOW (ref 150–400)
RDW: 14.5 % (ref 11.5–15.5)
WBC: 3.4 10*3/uL — ABNORMAL LOW (ref 4.0–10.5)

## 2013-10-22 LAB — BASIC METABOLIC PANEL WITH GFR
Chloride: 101 mEq/L (ref 96–112)
Creat: 0.89 mg/dL (ref 0.50–1.10)
GFR, Est African American: 78 mL/min
GFR, Est Non African American: 68 mL/min
Potassium: 4.8 mEq/L (ref 3.5–5.3)
Sodium: 140 mEq/L (ref 135–145)

## 2013-10-22 LAB — LIPID PANEL
HDL: 61 mg/dL (ref >39)
Total CHOL/HDL Ratio: 2.4 Ratio
VLDL: 23 mg/dL (ref 0–40)

## 2013-10-22 NOTE — Progress Notes (Signed)
HPI Patient presents for 6 month follow up with hypertension, hyperlipidemia, prediabetes and vitamin D. Patient's blood pressure has been controlled at home. Patient denies chest pain, shortness of breath. Patient states she gets dizzy/vertigo for years, and has gotten worse for the past year. When she stands up, looks up and she has to get to a chair to sit herself down but one time she could not get to a chair on time so she had syncope. Declines incontinence, post ictal period. She had a negative MRI in 2012 that showed chronic ischemic changes, negative carotid stenosis. She states occasionally she feels her heart beating extra hard with palpations, with palpiations.  Normal Echo with Dr. Jens Som in 2012.  Patient's cholesterol is diet controlled. In addition they are on pravastatin  and denies myalgias. The cholesterol last visit was LDL 61 Hypothyroid and last TSH was 0.297 so her medications was decreased to 1/2 pill daily.  Patient is on Vitamin D supplement. D 104  Current Medications:  Current Outpatient Prescriptions on File Prior to Visit  Medication Sig Dispense Refill  . ALPRAZolam (XANAX) 1 MG tablet Take 1 mg by mouth daily.        Marland Kitchen atenolol (TENORMIN) 25 MG tablet Take 25 mg by mouth daily.      . Cholecalciferol (VITAMIN D PO) Take by mouth daily.        . folic acid (FOLVITE) 1 MG tablet Take 2 mg by mouth daily.       . hydrochlorothiazide (HYDRODIURIL) 25 MG tablet Take 25 mg by mouth daily.       . hydroxychloroquine (PLAQUENIL) 200 MG tablet Take by mouth daily. Take 1 tab AM and PM      . Ibuprofen (ADVIL PO) Take 800 mg by mouth as needed.        Marland Kitchen LEVOTHYROXINE SODIUM PO Take 150 tablets by mouth as directed.       . Methotrexate Sodium (METHOTREXATE PO) Take 2.5 mg by mouth. 10 pills weekly      . omeprazole (PRILOSEC) 40 MG capsule Take 40 mg by mouth daily.       . pantoprazole (PROTONIX) 40 MG tablet Take 1 tablet (40 mg total) by mouth daily.  90 tablet  3  .  pravastatin (PRAVACHOL) 10 MG tablet Take 10 mg by mouth daily.       No current facility-administered medications on file prior to visit.   Medical History:  Past Medical History  Diagnosis Date  . Hyperlipidemia   . Hypothyroid   . Anemia     Hx of   . H. pylori infection     Hx of   . GERD (gastroesophageal reflux disease)   . PUD (peptic ulcer disease)   . Calculus of bile duct without mention of cholecystitis or obstruction   . Arthritis   . Rheumatoid arthritis(714.0)   . Hypertension   . Cataract   . Heart murmur    Allergies:  Allergies  Allergen Reactions  . Ace Inhibitors Cough  . Ambien [Zolpidem] Other (See Comments)    Odd Feeling  . Crestor [Rosuvastatin]   . Pravastatin   . Prozac [Fluoxetine Hcl] Other (See Comments)    Decreased libido  . Zoloft [Sertraline Hcl]     ROS Constitutional: Denies fever, chills, headaches, insomnia, fatigue, night sweats Eyes: Denies redness, blurred vision, diplopia, discharge, itchy, watery eyes.  ENT: Denies congestion, post nasal drip, sore throat, earache, dental pain, Tinnitus, Vertigo, Sinus pain, snoring.  Cardio: + palpitations, syncope/presyncope. Denies chest pain, palpitations, irregular heartbeat, dyspnea, diaphoresis, orthopnea, PND, claudication, edema Respiratory: denies cough, shortness of breath, wheezing.  Gastrointestinal: Denies dysphagia, heartburn, AB pain/ cramps, N/V, diarrhea, constipation, hematemesis, melena, hematochezia,  hemorrhoids Genitourinary: Denies dysuria, frequency, urgency, nocturia, hesitancy, discharge, hematuria, flank pain Musculoskeletal: Denies myalgia, stiffness, pain, swelling and strain/sprain. Skin: Denies pruritis, rash, changing in skin lesion Neuro: Denies Weakness, tremor, incoordination, spasms, pain Psychiatric: Denies confusion, memory loss, sensory loss Endocrine: Denies change in weight, skin, hair change, nocturia Diabetic Polys, Denies visual blurring, hyper /hypo  glycemic episodes, and paresthesia, Heme/Lymph: Denies Excessive bleeding, bruising, enlarged lymph nodes  Family history- Review and unchanged Social history- Review and unchanged Physical Exam: Filed Vitals:   10/22/13 1141  BP: 118/68  Pulse: 68  Temp: 97.3 F (36.3 C)  Resp: 16   Filed Weights   10/22/13 1141  Weight: 169 lb (76.658 kg)   General Appearance: Well nourished, in no apparent distress. Eyes: PERRLA, EOMs, conjunctiva no swelling or erythema Sinuses: No Frontal/maxillary tenderness ENT/Mouth: Ext aud canals clear, TMs without erythema, bulging. No erythema, swelling, or exudate on post pharynx.  Tonsils not swollen or erythematous. Hearing normal.  Neck: Supple, thyroid normal.  Respiratory: Respiratory effort normal, BS equal bilaterally without rales, rhonchi, wheezing or stridor.  Cardio: RRR with no MRGs. Brisk peripheral pulses without edema.  Abdomen: Soft, + BS.  Non tender, no guarding, rebound, hernias, masses. Lymphatics: Non tender without lymphadenopathy.  Musculoskeletal: Full ROM, 5/5 strength, normal gait.  Skin: Warm, dry without rashes, lesions, ecchymosis.  Neuro: Cranial nerves intact. Normal muscle tone, no cerebellar symptoms. Sensation intact.  Psych: Awake and oriented X 3, normal affect, Insight and Judgment appropriate.   Assessment and Plan:  Hypertension: Continue medication, monitor blood pressure at home.  Continue DASH diet. Cholesterol: Continue diet and exercise. Check cholesterol.  Syncope- has happened several years, getting worse. Orthostatics are normal however we will still decrease her HCTZ to 1/2 pill, and we will get a holter monitor.  Vitamin D Def- check level and continue medications.   Continue diet and meds as discussed. Further disposition pending results of labs.  Quentin Mulling 11:53 AM

## 2013-10-22 NOTE — Patient Instructions (Addendum)
Can stop your HCTZ/Fluid pill for 1-2 weeks, please check your BP in the mean time, please add it back on 1/2 pill daily if BP greater than 140/90.  Please get compression stockings and wear daily for 2 weeks- can get from medical supply store but [employer]  has a wide variety of well priced compression stockings. [address] [place], [place] [address] [phone] this is cheapest  Syncope Syncope is a fainting spell. This means the person loses consciousness and drops to the ground. The person is generally unconscious for less than 5 minutes. The person may have some muscle twitches for up to 15 seconds before waking up and returning to normal. Syncope occurs more often in elderly people, but it can happen to anyone. While most causes of syncope are not dangerous, syncope can be a sign of a serious medical problem. It is important to seek medical care.  CAUSES  Syncope is caused by a sudden decrease in blood flow to the brain. The specific cause is often not determined. Factors that can trigger syncope include:  Taking medicines that lower blood pressure.  Sudden changes in posture, such as standing up suddenly.  Taking more medicine than prescribed.  Standing in one place for too long.  Seizure disorders.  Dehydration and excessive exposure to heat.  Low blood sugar (hypoglycemia).  Straining to have a bowel movement.  Heart disease, irregular heartbeat, or other circulatory problems.  Fear, emotional distress, seeing blood, or severe pain. SYMPTOMS  Right before fainting, you may:  Feel dizzy or lightheaded.  Feel nauseous.  See all white or all black in your field of vision.  Have cold, clammy skin. DIAGNOSIS  Your caregiver will ask about your symptoms, perform a physical exam, and perform electrocardiography (ECG) to record the electrical activity of your heart. Your caregiver may also perform other heart or blood tests to determine the cause of your  syncope. TREATMENT  In most cases, no treatment is needed. Depending on the cause of your syncope, your caregiver may recommend changing or stopping some of your medicines. HOME CARE INSTRUCTIONS  Have someone stay with you until you feel stable.  Do not drive, operate machinery, or play sports until your caregiver says it is okay.  Keep all follow-up appointments as directed by your caregiver.  Lie down right away if you start feeling like you might faint. Breathe deeply and steadily. Wait until all the symptoms have passed.  Drink enough fluids to keep your urine clear or pale yellow.  If you are taking blood pressure or heart medicine, get up slowly, taking several minutes to sit and then stand. This can reduce dizziness. SEEK IMMEDIATE MEDICAL CARE IF:   You have a severe headache.  You have unusual pain in the chest, abdomen, or back.  You are bleeding from the mouth or rectum, or you have black or tarry stool.  You have an irregular or very fast heartbeat.  You have pain with breathing.  You have repeated fainting or seizure-like jerking during an episode.  You faint when sitting or lying down.  You have confusion.  You have difficulty walking.  You have severe weakness.  You have vision problems. If you fainted, call your local emergency services (911 in [place] Do not drive yourself to the hospital.  MAKE SURE YOU:  Understand these instructions.  Will watch your condition.  Will get help right away if you are not doing well or get worse. Document Released: 10/25/2005  Document Revised: 04/25/2012 Document Reviewed: 12/24/2011 [facility] Patient Information 2014 [place], [place].

## 2013-10-26 ENCOUNTER — Encounter (INDEPENDENT_AMBULATORY_CARE_PROVIDER_SITE_OTHER): Payer: Commercial Managed Care - HMO

## 2013-10-26 ENCOUNTER — Encounter: Payer: Self-pay | Admitting: Radiology

## 2013-10-26 DIAGNOSIS — R55 Syncope and collapse: Secondary | ICD-10-CM

## 2013-10-26 NOTE — Progress Notes (Signed)
Patient ID: [name], female   DOB: [birth year], 67 y.o.   MRN: [id number] E cardio 48hr holter monitor applied

## 2013-11-18 ENCOUNTER — Other Ambulatory Visit: Payer: Self-pay | Admitting: Internal Medicine

## 2013-11-18 DIAGNOSIS — R002 Palpitations: Secondary | ICD-10-CM

## 2013-11-18 DIAGNOSIS — R55 Syncope and collapse: Secondary | ICD-10-CM

## 2013-11-19 NOTE — Telephone Encounter (Signed)
Patient aware that RX will not be refilled, she states that she did not request this RX the pharmacy sent it anyway

## 2013-11-21 ENCOUNTER — Telehealth: Payer: Self-pay | Admitting: Physician Assistant

## 2013-11-21 NOTE — Telephone Encounter (Signed)
Attempted to call patient to tell her about Holter monitor results which only showed PVC/PACs and to discuss next appropriate step.

## 2013-12-04 ENCOUNTER — Other Ambulatory Visit: Payer: Self-pay | Admitting: Internal Medicine

## 2013-12-04 DIAGNOSIS — Z1231 Encounter for screening mammogram for malignant neoplasm of breast: Secondary | ICD-10-CM

## 2013-12-10 ENCOUNTER — Ambulatory Visit (INDEPENDENT_AMBULATORY_CARE_PROVIDER_SITE_OTHER): Payer: Medicare HMO | Admitting: Cardiology

## 2013-12-10 ENCOUNTER — Encounter: Payer: Self-pay | Admitting: Cardiology

## 2013-12-10 VITALS — BP 184/93 | HR 59 | Ht 61.5 in | Wt 174.0 lb

## 2013-12-10 DIAGNOSIS — I10 Essential (primary) hypertension: Secondary | ICD-10-CM

## 2013-12-10 DIAGNOSIS — R55 Syncope and collapse: Secondary | ICD-10-CM

## 2013-12-10 DIAGNOSIS — E785 Hyperlipidemia, unspecified: Secondary | ICD-10-CM

## 2013-12-10 NOTE — Assessment & Plan Note (Signed)
Given orthostatic symptoms and syncope I will allow her blood pressure to run higher. Discontinue HCTZ. She will follow her blood pressure at home and keep records. She will also bring her blood pressure cuff at time of next office visit for correlation.

## 2013-12-10 NOTE — Assessment & Plan Note (Signed)
Management per primary care. 

## 2013-12-10 NOTE — Progress Notes (Signed)
HPI: 68 year old female for followup of syncope. I initially saw her in 2012 for a murmur. Echocardiogram in December of 2012 showed normal LV function, grade 1 diastolic dysfunction and mild mitral regurgitation. Renal Dopplers in November of 2013 showed no renal artery stenosis. Holter monitor in December of 2014 showed sinus rhythm with occasional PACs. Laboratories in December of 2014 showed normal hemoglobin, potassium and TSH. Over the past several years she has had dizzy spells and occasional syncope. Each episode occurs after standing up from the lying or sitting position. She feels dizzy and then has frank syncope. There is no associated chest pain, palpitations, dyspnea or nausea.  Current Outpatient Prescriptions  Medication Sig Dispense Refill  . alendronate (FOSAMAX) 70 MG tablet       . ALPRAZolam (XANAX) 1 MG tablet Take 1 mg by mouth daily.        Marland Kitchen atenolol (TENORMIN) 25 MG tablet Take 25 mg by mouth daily.      . Benzocaine-Menthol 3-3 MG STRP       . Cholecalciferol (VITAMIN D PO) Take by mouth daily.        . folic acid (FOLVITE) 1 MG tablet Take 2 mg by mouth daily.       . hydrochlorothiazide (HYDRODIURIL) 25 MG tablet Take 25 mg by mouth daily.       . hydroxychloroquine (PLAQUENIL) 200 MG tablet Take by mouth daily. Take 1 tab AM and PM      . Ibuprofen (ADVIL PO) Take 800 mg by mouth as needed.        Marland Kitchen LEVOTHYROXINE SODIUM PO Take 150 tablets by mouth as directed.       Marland Kitchen losartan (COZAAR) 50 MG tablet Take 50 mg by mouth daily.      . Methotrexate Sodium (METHOTREXATE PO) Take 2.5 mg by mouth. 10 pills weekly      . omeprazole (PRILOSEC) 40 MG capsule Take 40 mg by mouth daily.       . pravastatin (PRAVACHOL) 10 MG tablet Take 10 mg by mouth daily.       No current facility-administered medications for this visit.     Past Medical History  Diagnosis Date  . Hyperlipidemia   . Hypothyroid   . Anemia     Hx of   . H. pylori infection     Hx of   .  GERD (gastroesophageal reflux disease)   . PUD (peptic ulcer disease)   . Calculus of bile duct without mention of cholecystitis or obstruction   . Arthritis   . Rheumatoid arthritis(714.0)   . Hypertension   . Cataract   . Heart murmur     Past Surgical History  Procedure Laterality Date  . Vaginal hysterectomy      ovaries not removed  . Carpal tunnel release    . Cystectomy      left breast  . Av fistula repair    . Bladder suspension    . Pilonidal cyst excision    . Trigger finger release    . Bunionectomy    . Ercp  09/29/2011    Procedure: ENDOSCOPIC RETROGRADE CHOLANGIOPANCREATOGRAPHY (ERCP);  Surgeon: Inda Castle, MD;  Location: [facility];  Service: Endoscopy;  Laterality: N/A;  . Tonsillectomy    . Cholecystectomy  09/07/2011  . Tenolysis  10/26/2011    Procedure: TENDON SHEATH RELEASE/TENOLYSIS;  Surgeon: Wynonia Sours, MD;  Location: [facility];  Service: Orthopedics;  Laterality: Right;  tenosynovectomy removal superficialis slip right index finger    History   Social History  . Marital Status: Married    Spouse Name: N/A    Number of Children: 2  . Years of Education: N/A   Occupational History  . retired     Retired   Social History Main Topics  . Smoking status: Former Smoker    Quit date: 10/25/1995  . Smokeless tobacco: Never Used     Comment: Maryann Conners [id number]  . Alcohol Use: Yes     Comment: occasional  . Drug Use: No  . Sexual Activity: Yes    Birth Control/ Protection: Post-menopausal   Other Topics Concern  . Not on file   Social History Narrative  . No narrative on file    ROS: no fevers or chills, productive cough, hemoptysis, dysphasia, odynophagia, melena, hematochezia, dysuria, hematuria, rash, seizure activity, orthopnea, PND, pedal edema, claudication. Remaining systems are negative.  Physical Exam: Well-developed well-nourished in no acute distress.  Skin is warm and dry.  HEENT is normal.  Neck is supple.    Chest is clear to auscultation with normal expansion.  Cardiovascular exam is regular rate and rhythm.  Abdominal exam nontender or distended. No masses palpated. Extremities show no edema. neuro grossly intact  ECG sinus rhythm at a rate of 59. Left atrial enlargement. Nonspecific ST changes.

## 2013-12-10 NOTE — Patient Instructions (Signed)
Your physician recommends that you schedule a follow-up appointment in: [place]  STOP HCTZ  INCREASE FLUIDS AND SALT INTAKE  BRING BLOOD PRESSURE CUFF TO NEXT APPOINTMENT

## 2013-12-10 NOTE — Assessment & Plan Note (Signed)
Patient sounds to be having orthostatic hypotension followed by syncope. Each episode occurs after changing positions. I have asked her to increase her by mouth fluid intake. I have also asked her to increase her sodium intake. Discontinue hydrochlorothiazide. If symptoms persist despite discontinuing HCTZ we will also consider discontinuing Cozaar and atenolol. Note LV function is normal. Monitor shows sinus rhythm with PACs.

## 2013-12-11 ENCOUNTER — Encounter: Payer: Self-pay | Admitting: Internal Medicine

## 2013-12-13 ENCOUNTER — Encounter: Payer: Self-pay | Admitting: Physician Assistant

## 2013-12-13 ENCOUNTER — Ambulatory Visit (INDEPENDENT_AMBULATORY_CARE_PROVIDER_SITE_OTHER): Payer: Medicare HMO | Admitting: Physician Assistant

## 2013-12-13 VITALS — BP 138/78 | HR 68 | Temp 97.7°F | Resp 16 | Ht 61.5 in | Wt 176.0 lb

## 2013-12-13 DIAGNOSIS — IMO0002 Reserved for concepts with insufficient information to code with codable children: Secondary | ICD-10-CM

## 2013-12-13 DIAGNOSIS — M7520 Bicipital tendinitis, unspecified shoulder: Secondary | ICD-10-CM

## 2013-12-13 DIAGNOSIS — M7550 Bursitis of unspecified shoulder: Secondary | ICD-10-CM

## 2013-12-13 DIAGNOSIS — M75100 Unspecified rotator cuff tear or rupture of unspecified shoulder, not specified as traumatic: Secondary | ICD-10-CM

## 2013-12-13 DIAGNOSIS — M7521 Bicipital tendinitis, right shoulder: Secondary | ICD-10-CM

## 2013-12-13 DIAGNOSIS — M069 Rheumatoid arthritis, unspecified: Secondary | ICD-10-CM

## 2013-12-13 MED ORDER — HYDROCODONE-ACETAMINOPHEN 5-325 MG PO TABS: 1.0000 | ORAL_TABLET | Freq: Four times a day (QID) | ORAL | Status: DC | PRN

## 2013-12-13 MED ORDER — DEXAMETHASONE SODIUM PHOSPHATE 100 MG/10ML IJ SOLN: 10.0000 mg | Freq: Once | INTRAMUSCULAR | Status: DC

## 2013-12-13 NOTE — Progress Notes (Signed)
   Subjective:    Patient ID: [name], female    DOB: August 30, 1946, 68 y.o.   MRN: [id number]  Arm Pain  Incident onset: one month ago. There was no injury mechanism. The pain is present in the right shoulder. The quality of the pain is described as shooting, aching and stabbing. The pain radiates to the right arm. The pain is moderate. The pain has been intermittent since the incident. Pertinent negatives include no chest pain, muscle weakness, numbness or tingling. The symptoms are aggravated by movement, lifting and palpation. Treatments tried: biofreeze, advil, meloxicam, and pain pill from friend. The treatment provided mild relief.    Review of Systems  Constitutional: Negative.   HENT: Negative.   Respiratory: Negative.   Cardiovascular: Negative.  Negative for chest pain.  Gastrointestinal: Negative.   Genitourinary: Negative.   Musculoskeletal: Positive for arthralgias and myalgias.  Neurological: Negative.  Negative for tingling and numbness.  Hematological: Negative.        Objective:   Physical Exam  Constitutional: She is oriented to person, place, and time. She appears well-developed and well-nourished.  HENT:  Head: Normocephalic and atraumatic.  Neck: Normal range of motion. Neck supple.  Cardiovascular: Normal rate and regular rhythm.   Pulmonary/Chest: Effort normal and breath sounds normal.  Abdominal: Soft. Bowel sounds are normal.  Musculoskeletal:       Right shoulder: She exhibits decreased range of motion (limtied abduction to 90 degree active and 120  passive), tenderness (At subacromial bursa and biceps tendon), crepitus, pain and spasm. She exhibits no bony tenderness, no swelling, no effusion, no deformity, no laceration, normal pulse and normal strength.  Distal sensation intact.   Lymphadenopathy:    She has no cervical adenopathy.  Neurological: She is alert and oriented to person, place, and time. She has normal reflexes.  Skin: Skin is warm  and dry. No rash noted.   A trigger point injection was performed at the site of maximal tenderness at anterior right shoulder using 1% plain Lidocaine and Dexamethasone 10mg . This was well tolerated, and followed by immediate relief of pain.     Assessment & Plan:  Right Subacromial Bursitis/Bicep tendonitis  Dexametasome injection  norco 5/325 # 41 Nr  Follow up Dr. Lenna Gilford  Stretches done may need PT in the future.   If worse/not better call and we can get Xray/refer to ortho or PT

## 2013-12-13 NOTE — Patient Instructions (Signed)
Biceps Tendon Tendinitis (Proximal) and Tenosynovitis with Rehab Tendonitis and tenosynovitis involve inflammation of the tendon and the tendon lining (sheath). The proximal biceps tendon is vulnerable to tendonitis and tenosynovitis, which causes pain and discomfort in the front of the shoulder and upper arm. The tendon lining secretes a fluid that helps lubricate the tendon, allowing for proper function without pain. When the tendon and its lining become inflamed, the tendon can no longer glide smoothly, causing pain. The proximal biceps tendon connects the biceps muscle to two bones of the shoulder. It is important for proper function of the elbow and turning the palm upward (supination) using the wrist. Proximal biceps tendon tendinitis may include a grade 1 or 2 strain of the tendon. Grade 1 strains involve a slight pull of the tendon without signs of tearing and no observed tendon lengthening. There is also no loss of strength. Grade 2 strains involve small tears in the tendon fibers. The tendon or muscle is stretched and strength is usually decreased.  SYMPTOMS   Pain, tenderness, swelling, warmth, or redness over the front of the shoulder.  Pain that gets worse with shoulder and elbow use, especially against resistance.  Limited motion of the shoulder or elbow.  Crackling sound (crepitation) when the tendon or shoulder is moved or touched. CAUSES  The symptoms of biceps tendonitis are due to inflammation of the tendon. Inflammation may be caused by:  Strain from sudden increase in amount or intensity of activity.  Direct blow or injury to the elbow (uncommon).  Overuse or repetitive elbow bending or wrist rotation, particularly when turning the palm up, or with elbow hyperextension. RISK INCREASES WITH:  Sports that involve contact or overhead arm activity (throwing sports, gymnastics, weightlifting, bodybuilding, rock climbing).  Heavy labor.  Poor strength and  flexibility.  Failure to warm up properly before activity. PREVENTION  Warm up and stretch properly before activity.  Allow time for recovery between activities.  Maintain physical fitness:  Strength, flexibility, and endurance.  Cardiovascular fitness.  Learn and use proper exercise technique. PROGNOSIS  With proper treatment, proximal biceps tendon tendonitis and tenosynovitis is usually curable within 6 weeks. Healing is usually quicker if the cause was a direct blow, not overuse.  RELATED COMPLICATIONS   Longer healing time if not properly treated or if not given enough time to heal.  Chronically inflamed tendon that causes persistent pain with activity, that may progress to constant pain and potentially rupture of the tendon.  Recurring symptoms, especially if activity is resumed too soon or with overuse, a direct blow, or use of poor exercise technique. TREATMENT Treatment first involves ice and medicine, to reduce pain and inflammation. It is helpful to modify activities that cause pain, to reduce the chances of causing the condition to get worse. Strengthening and stretching exercises should be performed to promote proper use of the muscles of the shoulder. These exercises may be performed at home or with a therapist. Other treatments may be given such as ultrasound or heat therapy. A corticosteroid injection may be recommended to help reduce inflammation of the tendon lining. Surgery is usually not necessary. Sometimes, if symptoms last for greater than 6 months, surgery will be advised to detach the tendon and re-insert it into the arm bone. Surgery to correct other shoulder problems that may be contributing to tendinitis may be advised before surgery for the tendinitis itself.  MEDICATION  If pain medicine is needed, nonsteroidal anti-inflammatory medicines (aspirin and ibuprofen), or other minor pain relievers (  acetaminophen), are often advised.  Do not take pain medicine  for 7 days before surgery.  Prescription pain relievers may be given if your caregiver thinks they are needed. Use only as directed and only as much as you need.  Corticosteroid injections may be given. These injections should only be used on the most severe cases, as one can only receive a limited number of them. HEAT AND COLD   Cold treatment (icing) should be applied for 10 to 15 minutes every 2 to 3 hours for inflammation and pain, and immediately after activity that aggravates your symptoms. Use ice packs or an ice massage.  Heat treatment may be used before performing stretching and strengthening activities prescribed by your caregiver, physical therapist, or athletic trainer. Use a heat pack or a warm water soak. SEEK MEDICAL CARE IF:   Symptoms get worse or do not improve in 2 weeks, despite treatment.  New, unexplained symptoms develop. (Drugs used in treatment may produce side effects.) EXERCISES RANGE OF MOTION (ROM) AND EXERCISES - Biceps Tendon (Proximal) and Tenosynovitis These exercises may help you when beginning to rehabilitate your injury. Your symptoms may go away with or without further involvement from your physician, physical therapist, or athletic trainer. While completing these exercises, remember:   Restoring tissue flexibility helps normal motion to return to the joints. This allows healthier, less painful movement and activity.  An effective stretch should be held for at least 30 seconds.  A stretch should never be painful. You should only feel a gentle lengthening or release in the stretched tissue. STRETCH  Flexion, Standing  Stand with good posture. With an underhand grip on your right / left hand and an overhand grip on the opposite hand, grasp a broomstick or cane so that your hands are a little more than shoulder width apart.  Keeping your right / left elbow straight and shoulder muscles relaxed, push the stick with your opposite hand to raise your right /  left arm in front of your body and then overhead. Raise your arm until you feel a stretch in your right / left shoulder, but before you have increased shoulder pain.  Try to avoid shrugging your right / left shoulder as your arm rises, by keeping your shoulder blade tucked down and toward your mid-back spine. Hold for __________ seconds.  Slowly return to the starting position. Repeat __________ times. Complete this exercise __________ times per day. STRETCH  Abduction, Supine  Lie on your back. With an underhand grip on your right / left hand and an overhand grip on the opposite hand, grasp a broomstick or cane so that your hands are a little more than shoulder width apart.  Keeping your right / left elbow straight and shoulder muscles relaxed, push the stick with your opposite hand to raise your right / left arm out to the side of your body and then overhead. Raise your arm until you feel a stretch in your right / left shoulder, but before you have increased shoulder pain.  Try to avoid shrugging your right / left shoulder as your arm rises, by keeping your shoulder blade tucked down and toward your mid-back spine. Hold for __________ seconds.  Slowly return to the starting position. Repeat __________ times. Complete this exercise __________ times per day. ROM  Flexion, Active-Assisted  Lie on your back. You may bend your knees for comfort.  Grasp a broomstick or cane so your hands are about shoulder width apart. Your right / left hand should   grip the end of the stick so that your hand is positioned "thumbs-up," as if you were about to shake hands.  Using your healthy arm to lead, raise your right / left arm overhead until you feel a gentle stretch in your shoulder. Hold for __________ seconds.  Use the stick to assist in returning your right / left arm to its starting position. Repeat __________ times. Complete this exercise __________ times per day.  STRETCH  Flexion, Standing   Stand  facing a wall. Walk your right / left fingers up the wall until you feel a moderate stretch in your shoulder. As your hand gets higher, you may need to step closer to the wall or use a door frame to walk through.  Try to avoid shrugging your right / left shoulder as your arm rises, by keeping your shoulder blade tucked down and toward your mid-back spine.  Hold for __________ seconds. Use your other hand, if needed, to ease out of the stretch and return to the starting position. Repeat __________ times. Complete this exercise __________ times per day.  ROM - Internal Rotation   Using underhand grips, grasp a stick behind your back with both hands.  While standing upright with good posture, slide the stick up your back until you feel a mild stretch in the front of your shoulder.  Hold for __________ seconds. Slowly return to your starting position. Repeat __________ times. Complete this exercise __________ times per day.  STRETCH - Internal Rotation  Place your right / left hand behind your back, palm-up.  Throw a towel or belt over your opposite shoulder. Grasp the towel with your right / left hand.  While keeping an upright posture, gently pull up on the towel until you feel a stretch in the front of your right / left shoulder.  Avoid shrugging your right / left shoulder as your arm rises, by keeping your shoulder blade tucked down and toward your mid-back spine.  Hold for __________ seconds. Release the stretch by lowering your opposite hand. Repeat __________ times. Complete this exercise __________ times per day. STRENGTHENING EXERCISES - Biceps Tendon Tendinitis (Proximal) and Tenosynovitis These exercises may help you regain your strength after your physician has discontinued your restraint in a cast or brace. They may resolve your symptoms with or without further involvement from your physician, physical therapist or athletic trainer. While completing these exercises, remember:    Muscles can gain both the endurance and the strength needed for everyday activities through controlled exercises.  Complete these exercises as instructed by your physician, physical therapist or athletic trainer. Increase the resistance and repetitions only as guided.  You may experience muscle soreness or fatigue, but the pain or discomfort you are trying to eliminate should never worsen during these exercises. If this pain does get worse, stop and make sure you are following the directions exactly. If the pain is still present after adjustments, discontinue the exercise until you can discuss the trouble with your caregiver. STRENGTH - Elbow Flexors, Isometric  Stand or sit upright on a firm surface. Place your right / left arm so that your hand is palm-up and at the height of your waist.  Place your opposite hand on top of your forearm. Gently push down as your right / left arm resists. Push as hard as you can with both arms, without causing any pain or movement at your right / left elbow. Hold this stationary position for __________ seconds.  Gradually release the tension in both   arms. Allow your muscles to relax completely before repeating. Repeat __________ times. Complete this exercise __________ times per day. STRENGTH - Shoulder Flexion, Isometric  With good posture and facing a wall, stand or sit about 4-6 inches away.  Keeping your right / left elbow straight, gently press the top of your fist into the wall. Increase the pressure gradually until you are pressing as hard as you can, without shrugging your shoulder or increasing any shoulder discomfort.  Hold for __________ seconds.  Release the tension slowly. Relax your shoulder muscles completely before you start the next repetition. Repeat __________ times. Complete this exercise __________ times per day.  STRENGTH  Elbow Flexors, Supinated  With good posture, stand or sit on a firm chair without armrests. Allow your right /  left arm to rest at your side with your palm facing forward.  Holding a __________ weight, or gripping a rubber exercise band or tubing,  bring your hand toward your shoulder.  Allow your muscles to control the resistance as your hand returns to your side. Repeat __________ times. Complete this exercise __________ times per day.  STRENGTH - Shoulder Flexion  Stand or sit with good posture. Grasp a __________ weight, or an exercise band or tubing, so that your hand is "thumbs-up," like when you shake hands.  Slowly lift your right / left arm as far as you can, without increasing any shoulder pain. At first, many people can only raise their hand to shoulder height.  Avoid shrugging your right / left shoulder as your arm rises, by keeping your shoulder blade tucked down and toward your mid-back spine.  Hold for __________ seconds. Control the descent of your hand as you slowly return to your starting position. Repeat __________ times. Complete this exercise __________ times per day. Document Released: 10/25/2005 Document Revised: 01/17/2012 Document Reviewed: 02/06/2009 [facility] Patient Information 2014 [place], [place]. Shoulder Exercises EXERCISES  RANGE OF MOTION (ROM) AND STRETCHING EXERCISES These exercises may help you when beginning to rehabilitate your injury. Your symptoms may resolve with or without further involvement from your physician, physical therapist or athletic trainer. While completing these exercises, remember:   Restoring tissue flexibility helps normal motion to return to the joints. This allows healthier, less painful movement and activity.  An effective stretch should be held for at least 30 seconds.  A stretch should never be painful. You should only feel a gentle lengthening or release in the stretched tissue. ROM - Pendulum  Bend at the waist so that your right / left arm falls away from your body. Support yourself with your opposite hand on a solid surface,  such as a table or a countertop.  Your right / left arm should be perpendicular to the ground. If it is not perpendicular, you need to lean over farther. Relax the muscles in your right / left arm and shoulder as much as possible.  Gently sway your hips and trunk so they move your right / left arm without any use of your right / left shoulder muscles.  Progress your movements so that your right / left arm moves side to side, then forward and backward, and finally, both clockwise and counterclockwise.  Complete __________ repetitions in each direction. Many people use this exercise to relieve discomfort in their shoulder as well as to gain range of motion. Repeat __________ times. Complete this exercise __________ times per day. STRETCH  Flexion, Standing  Stand with good posture. With an underhand grip on your right / left hand  and an overhand grip on the opposite hand, grasp a broomstick or cane so that your hands are a little more than shoulder-width apart.  Keeping your right / left elbow straight and shoulder muscles relaxed, push the stick with your opposite hand to raise your right / left arm in front of your body and then overhead. Raise your arm until you feel a stretch in your right / left shoulder, but before you have increased shoulder pain.  Try to avoid shrugging your right / left shoulder as your arm rises by keeping your shoulder blade tucked down and toward your mid-back spine. Hold __________ seconds.  Slowly return to the starting position. Repeat __________ times. Complete this exercise __________ times per day. STRETCH - Internal Rotation  Place your right / left hand behind your back, palm-up.  Throw a towel or belt over your opposite shoulder. Grasp the towel/belt with your right / left hand.  While keeping an upright posture, gently pull up on the towel/belt until you feel a stretch in the front of your right / left shoulder.  Avoid shrugging your right / left  shoulder as your arm rises by keeping your shoulder blade tucked down and toward your mid-back spine.  Hold __________. Release the stretch by lowering your opposite hand. Repeat __________ times. Complete this exercise __________ times per day. STRETCH - External Rotation and Abduction  Stagger your stance through a doorframe. It does not matter which foot is forward.  As instructed by your physician, physical therapist or athletic trainer, place your hands:  And forearms above your head and on the door frame.  And forearms at head-height and on the door frame.  At elbow-height and on the door frame.  Keeping your head and chest upright and your stomach muscles tight to prevent over-extending your low-back, slowly shift your weight onto your front foot until you feel a stretch across your chest and/or in the front of your shoulders.  Hold __________ seconds. Shift your weight to your back foot to release the stretch. Repeat __________ times. Complete this stretch __________ times per day.  STRENGTHENING EXERCISES  These exercises may help you when beginning to rehabilitate your injury. They may resolve your symptoms with or without further involvement from your physician, physical therapist or athletic trainer. While completing these exercises, remember:   Muscles can gain both the endurance and the strength needed for everyday activities through controlled exercises.  Complete these exercises as instructed by your physician, physical therapist or athletic trainer. Progress the resistance and repetitions only as guided.  You may experience muscle soreness or fatigue, but the pain or discomfort you are trying to eliminate should never worsen during these exercises. If this pain does worsen, stop and make certain you are following the directions exactly. If the pain is still present after adjustments, discontinue the exercise until you can discuss the trouble with your clinician.  If  advised by your physician, during your recovery, avoid activity or exercises which involve actions that place your right / left hand or elbow above your head or behind your back or head. These positions stress the tissues which are trying to heal. STRENGTH - Scapular Depression and Adduction  With good posture, sit on a firm chair. Supported your arms in front of you with pillows, arm rests or a table top. Have your elbows in line with the sides of your body.  Gently draw your shoulder blades down and toward your mid-back spine. Gradually increase the tension without  tensing the muscles along the top of your shoulders and the back of your neck.  Hold for __________ seconds. Slowly release the tension and relax your muscles completely before completing the next repetition.  After you have practiced this exercise, remove the arm support and complete it in standing as well as sitting. Repeat __________ times. Complete this exercise __________ times per day.  STRENGTH - External Rotators  Secure a rubber exercise band/tubing to a fixed object so that it is at the same height as your right / left elbow when you are standing or sitting on a firm surface.  Stand or sit so that the secured exercise band/tubing is at your side that is not injured.  Bend your elbow 90 degrees. Place a folded towel or small pillow under your right / left arm so that your elbow is a few inches away from your side.  Keeping the tension on the exercise band/tubing, pull it away from your body, as if pivoting on your elbow. Be sure to keep your body steady so that the movement is only coming from your shoulder rotating.  Hold __________ seconds. Release the tension in a controlled manner as you return to the starting position. Repeat __________ times. Complete this exercise __________ times per day.  STRENGTH - Supraspinatus  Stand or sit with good posture. Grasp a __________ weight or an exercise band/tubing so that your  hand is "thumbs-up," like when you shake hands.  Slowly lift your right / left hand from your thigh into the air, traveling about 30 degrees from straight out at your side. Lift your hand to shoulder height or as far as you can without increasing any shoulder pain. Initially, many people do not lift their hands above shoulder height.  Avoid shrugging your right / left shoulder as your arm rises by keeping your shoulder blade tucked down and toward your mid-back spine.  Hold for __________ seconds. Control the descent of your hand as you slowly return to your starting position. Repeat __________ times. Complete this exercise __________ times per day.  STRENGTH - Shoulder Extensors  Secure a rubber exercise band/tubing so that it is at the height of your shoulders when you are either standing or sitting on a firm arm-less chair.  With a thumbs-up grip, grasp an end of the band/tubing in each hand. Straighten your elbows and lift your hands straight in front of you at shoulder height. Step back away from the secured end of band/tubing until it becomes tense.  Squeezing your shoulder blades together, pull your hands down to the sides of your thighs. Do not allow your hands to go behind you.  Hold for __________ seconds. Slowly ease the tension on the band/tubing as you reverse the directions and return to the starting position. Repeat __________ times. Complete this exercise __________ times per day.  STRENGTH - Scapular Retractors  Secure a rubber exercise band/tubing so that it is at the height of your shoulders when you are either standing or sitting on a firm arm-less chair.  With a palm-down grip, grasp an end of the band/tubing in each hand. Straighten your elbows and lift your hands straight in front of you at shoulder height. Step back away from the secured end of band/tubing until it becomes tense.  Squeezing your shoulder blades together, draw your elbows back as you bend them. Keep  your upper arm lifted away from your body throughout the exercise.  Hold __________ seconds. Slowly ease the tension on the band/tubing  as you reverse the directions and return to the starting position. Repeat __________ times. Complete this exercise __________ times per day. STRENGTH  Scapular Depressors  Find a sturdy chair without wheels, such as a from a dining room table.  Keeping your feet on the floor, lift your bottom from the seat and lock your elbows.  Keeping your elbows straight, allow gravity to pull your body weight down. Your shoulders will rise toward your ears.  Raise your body against gravity by drawing your shoulder blades down your back, shortening the distance between your shoulders and ears. Although your feet should always maintain contact with the floor, your feet should progressively support less body weight as you get stronger.  Hold __________ seconds. In a controlled and slow manner, lower your body weight to begin the next repetition. Repeat __________ times. Complete this exercise __________ times per day.  Document Released: 09/08/2005 Document Revised: 01/17/2012 Document Reviewed: 02/06/2009 [facility] Patient Information 2014 [place], [place].

## 2013-12-27 ENCOUNTER — Ambulatory Visit (INDEPENDENT_AMBULATORY_CARE_PROVIDER_SITE_OTHER): Payer: Commercial Managed Care - HMO | Admitting: Physician Assistant

## 2013-12-27 ENCOUNTER — Ambulatory Visit (HOSPITAL_COMMUNITY)
Admission: RE | Admit: 2013-12-27 | Discharge: 2013-12-27 | Disposition: A | Discharge status: Home / Self Care | Payer: Medicare HMO | Source: Ambulatory Visit | Attending: Physician Assistant | Admitting: Physician Assistant

## 2013-12-27 ENCOUNTER — Encounter: Payer: Self-pay | Admitting: Physician Assistant

## 2013-12-27 VITALS — BP 138/80 | HR 64 | Temp 98.1°F | Resp 16 | Ht 61.0 in | Wt 174.0 lb

## 2013-12-27 DIAGNOSIS — M25519 Pain in unspecified shoulder: Secondary | ICD-10-CM

## 2013-12-27 DIAGNOSIS — M25511 Pain in right shoulder: Secondary | ICD-10-CM

## 2013-12-27 DIAGNOSIS — M259 Joint disorder, unspecified: Secondary | ICD-10-CM | POA: Insufficient documentation

## 2013-12-27 MED ORDER — PREDNISONE 20 MG PO TABS: ORAL_TABLET | ORAL | Status: DC

## 2013-12-27 NOTE — Patient Instructions (Signed)
Shoulder Pain The shoulder is the joint that connects your arms to your body. The bones that form the shoulder joint include the upper arm bone (humerus), the shoulder blade (scapula), and the collarbone (clavicle). The top of the humerus is shaped like a ball and fits into a rather flat socket on the scapula (glenoid cavity). A combination of muscles and strong, fibrous tissues that connect muscles to bones (tendons) support your shoulder joint and hold the ball in the socket. Small, fluid-filled sacs (bursae) are located in different areas of the joint. They act as cushions between the bones and the overlying soft tissues and help reduce friction between the gliding tendons and the bone as you move your arm. Your shoulder joint allows a wide range of motion in your arm. This range of motion allows you to do things like scratch your back or throw a ball. However, this range of motion also makes your shoulder more prone to pain from overuse and injury. Causes of shoulder pain can originate from both injury and overuse and usually can be grouped in the following four categories:  Redness, swelling, and pain (inflammation) of the tendon (tendinitis) or the bursae (bursitis).  Instability, such as a dislocation of the joint.  Inflammation of the joint (arthritis).  Broken bone (fracture). HOME CARE INSTRUCTIONS   Apply ice to the sore area.  Put ice in a plastic bag.  Place a towel between your skin and the bag.  Leave the ice on for 15-20 minutes, 03-04 times per day for the first 2 days.  Stop using cold packs if they do not help with the pain.  If you have a shoulder sling or immobilizer, wear it as long as your caregiver instructs. Only remove it to shower or bathe. Move your arm as little as possible, but keep your hand moving to prevent swelling.  Squeeze a soft ball or foam pad as much as possible to help prevent swelling.  Only take over-the-counter or prescription medicines for pain,  discomfort, or fever as directed by your caregiver. SEEK MEDICAL CARE IF:   Your shoulder pain increases, or new pain develops in your arm, hand, or fingers.  Your hand or fingers become cold and numb.  Your pain is not relieved with medicines. SEEK IMMEDIATE MEDICAL CARE IF:   Your arm, hand, or fingers are numb or tingling.  Your arm, hand, or fingers are significantly swollen or turn white or blue. MAKE SURE YOU:   Understand these instructions.  Will watch your condition.  Will get help right away if you are not doing well or get worse. Document Released: 08/04/2005 Document Revised: 07/19/2012 Document Reviewed: 10/09/2011 [facility] Patient Information 2014 [place]. Biceps Tendon Tendinitis (Proximal) and Tenosynovitis with Rehab Tendonitis and tenosynovitis involve inflammation of the tendon and the tendon lining (sheath). The proximal biceps tendon is vulnerable to tendonitis and tenosynovitis, which causes pain and discomfort in the front of the shoulder and upper arm. The tendon lining secretes a fluid that helps lubricate the tendon, allowing for proper function without pain. When the tendon and its lining become inflamed, the tendon can no longer glide smoothly, causing pain. The proximal biceps tendon connects the biceps muscle to two bones of the shoulder. It is important for proper function of the elbow and turning the palm upward (supination) using the wrist. Proximal biceps tendon tendinitis may include a grade 1 or 2 strain of the tendon. Grade 1 strains involve a slight pull of the tendon without  signs of tearing and no observed tendon lengthening. There is also no loss of strength. Grade 2 strains involve small tears in the tendon fibers. The tendon or muscle is stretched and strength is usually decreased.  SYMPTOMS   Pain, tenderness, swelling, warmth, or redness over the front of the shoulder.  Pain that gets worse with shoulder and elbow use, especially  against resistance.  Limited motion of the shoulder or elbow.  Crackling sound (crepitation) when the tendon or shoulder is moved or touched. CAUSES  The symptoms of biceps tendonitis are due to inflammation of the tendon. Inflammation may be caused by:  Strain from sudden increase in amount or intensity of activity.  Direct blow or injury to the elbow (uncommon).  Overuse or repetitive elbow bending or wrist rotation, particularly when turning the palm up, or with elbow hyperextension. RISK INCREASES WITH:  Sports that involve contact or overhead arm activity (throwing sports, gymnastics, weightlifting, bodybuilding, rock climbing).  Heavy labor.  Poor strength and flexibility.  Failure to warm up properly before activity. PREVENTION  Warm up and stretch properly before activity.  Allow time for recovery between activities.  Maintain physical fitness:  Strength, flexibility, and endurance.  Cardiovascular fitness.  Learn and use proper exercise technique. PROGNOSIS  With proper treatment, proximal biceps tendon tendonitis and tenosynovitis is usually curable within 6 weeks. Healing is usually quicker if the cause was a direct blow, not overuse.  RELATED COMPLICATIONS   Longer healing time if not properly treated or if not given enough time to heal.  Chronically inflamed tendon that causes persistent pain with activity, that may progress to constant pain and potentially rupture of the tendon.  Recurring symptoms, especially if activity is resumed too soon or with overuse, a direct blow, or use of poor exercise technique. TREATMENT Treatment first involves ice and medicine, to reduce pain and inflammation. It is helpful to modify activities that cause pain, to reduce the chances of causing the condition to get worse. Strengthening and stretching exercises should be performed to promote proper use of the muscles of the shoulder. These exercises may be performed at home or  with a therapist. Other treatments may be given such as ultrasound or heat therapy. A corticosteroid injection may be recommended to help reduce inflammation of the tendon lining. Surgery is usually not necessary. Sometimes, if symptoms last for greater than 6 months, surgery will be advised to detach the tendon and re-insert it into the arm bone. Surgery to correct other shoulder problems that may be contributing to tendinitis may be advised before surgery for the tendinitis itself.  MEDICATION  If pain medicine is needed, nonsteroidal anti-inflammatory medicines (aspirin and ibuprofen), or other minor pain relievers (acetaminophen), are often advised.  Do not take pain medicine for 7 days before surgery.  Prescription pain relievers may be given if your caregiver thinks they are needed. Use only as directed and only as much as you need.  Corticosteroid injections may be given. These injections should only be used on the most severe cases, as one can only receive a limited number of them. HEAT AND COLD   Cold treatment (icing) should be applied for 10 to 15 minutes every 2 to 3 hours for inflammation and pain, and immediately after activity that aggravates your symptoms. Use ice packs or an ice massage.  Heat treatment may be used before performing stretching and strengthening activities prescribed by your caregiver, physical therapist, or athletic trainer. Use a heat pack or a warm water  soak. SEEK MEDICAL CARE IF:   Symptoms get worse or do not improve in 2 weeks, despite treatment.  New, unexplained symptoms develop. (Drugs used in treatment may produce side effects.) EXERCISES RANGE OF MOTION (ROM) AND EXERCISES - Biceps Tendon (Proximal) and Tenosynovitis These exercises may help you when beginning to rehabilitate your injury. Your symptoms may go away with or without further involvement from your physician, physical therapist, or athletic trainer. While completing these exercises,  remember:   Restoring tissue flexibility helps normal motion to return to the joints. This allows healthier, less painful movement and activity.  An effective stretch should be held for at least 30 seconds.  A stretch should never be painful. You should only feel a gentle lengthening or release in the stretched tissue. STRETCH  Flexion, Standing  Stand with good posture. With an underhand grip on your right / left hand and an overhand grip on the opposite hand, grasp a broomstick or cane so that your hands are a little more than shoulder width apart.  Keeping your right / left elbow straight and shoulder muscles relaxed, push the stick with your opposite hand to raise your right / left arm in front of your body and then overhead. Raise your arm until you feel a stretch in your right / left shoulder, but before you have increased shoulder pain.  Try to avoid shrugging your right / left shoulder as your arm rises, by keeping your shoulder blade tucked down and toward your mid-back spine. Hold for __________ seconds.  Slowly return to the starting position. Repeat __________ times. Complete this exercise __________ times per day. STRETCH  Abduction, Supine  Lie on your back. With an underhand grip on your right / left hand and an overhand grip on the opposite hand, grasp a broomstick or cane so that your hands are a little more than shoulder width apart.  Keeping your right / left elbow straight and shoulder muscles relaxed, push the stick with your opposite hand to raise your right / left arm out to the side of your body and then overhead. Raise your arm until you feel a stretch in your right / left shoulder, but before you have increased shoulder pain.  Try to avoid shrugging your right / left shoulder as your arm rises, by keeping your shoulder blade tucked down and toward your mid-back spine. Hold for __________ seconds.  Slowly return to the starting position. Repeat __________ times.  Complete this exercise __________ times per day. ROM  Flexion, Active-Assisted  Lie on your back. You may bend your knees for comfort.  Grasp a broomstick or cane so your hands are about shoulder width apart. Your right / left hand should grip the end of the stick so that your hand is positioned "thumbs-up," as if you were about to shake hands.  Using your healthy arm to lead, raise your right / left arm overhead until you feel a gentle stretch in your shoulder. Hold for __________ seconds.  Use the stick to assist in returning your right / left arm to its starting position. Repeat __________ times. Complete this exercise __________ times per day.  STRETCH  Flexion, Standing   Stand facing a wall. Walk your right / left fingers up the wall until you feel a moderate stretch in your shoulder. As your hand gets higher, you may need to step closer to the wall or use a door frame to walk through.  Try to avoid shrugging your right / left shoulder  as your arm rises, by keeping your shoulder blade tucked down and toward your mid-back spine.  Hold for __________ seconds. Use your other hand, if needed, to ease out of the stretch and return to the starting position. Repeat __________ times. Complete this exercise __________ times per day.  ROM - Internal Rotation   Using underhand grips, grasp a stick behind your back with both hands.  While standing upright with good posture, slide the stick up your back until you feel a mild stretch in the front of your shoulder.  Hold for __________ seconds. Slowly return to your starting position. Repeat __________ times. Complete this exercise __________ times per day.  STRETCH - Internal Rotation  Place your right / left hand behind your back, palm-up.  Throw a towel or belt over your opposite shoulder. Grasp the towel with your right / left hand.  While keeping an upright posture, gently pull up on the towel until you feel a stretch in the front of  your right / left shoulder.  Avoid shrugging your right / left shoulder as your arm rises, by keeping your shoulder blade tucked down and toward your mid-back spine.  Hold for __________ seconds. Release the stretch by lowering your opposite hand. Repeat __________ times. Complete this exercise __________ times per day. STRENGTHENING EXERCISES - Biceps Tendon Tendinitis (Proximal) and Tenosynovitis These exercises may help you regain your strength after your physician has discontinued your restraint in a cast or brace. They may resolve your symptoms with or without further involvement from your physician, physical therapist or athletic trainer. While completing these exercises, remember:   Muscles can gain both the endurance and the strength needed for everyday activities through controlled exercises.  Complete these exercises as instructed by your physician, physical therapist or athletic trainer. Increase the resistance and repetitions only as guided.  You may experience muscle soreness or fatigue, but the pain or discomfort you are trying to eliminate should never worsen during these exercises. If this pain does get worse, stop and make sure you are following the directions exactly. If the pain is still present after adjustments, discontinue the exercise until you can discuss the trouble with your caregiver. STRENGTH - Elbow Flexors, Isometric  Stand or sit upright on a firm surface. Place your right / left arm so that your hand is palm-up and at the height of your waist.  Place your opposite hand on top of your forearm. Gently push down as your right / left arm resists. Push as hard as you can with both arms, without causing any pain or movement at your right / left elbow. Hold this stationary position for __________ seconds.  Gradually release the tension in both arms. Allow your muscles to relax completely before repeating. Repeat __________ times. Complete this exercise __________ times  per day. STRENGTH - Shoulder Flexion, Isometric  With good posture and facing a wall, stand or sit about 4-6 inches away.  Keeping your right / left elbow straight, gently press the top of your fist into the wall. Increase the pressure gradually until you are pressing as hard as you can, without shrugging your shoulder or increasing any shoulder discomfort.  Hold for __________ seconds.  Release the tension slowly. Relax your shoulder muscles completely before you start the next repetition. Repeat __________ times. Complete this exercise __________ times per day.  STRENGTH  Elbow Flexors, Supinated  With good posture, stand or sit on a firm chair without armrests. Allow your right / left arm to  rest at your side with your palm facing forward.  Holding a __________ weight, or gripping a rubber exercise band or tubing,  bring your hand toward your shoulder.  Allow your muscles to control the resistance as your hand returns to your side. Repeat __________ times. Complete this exercise __________ times per day.  STRENGTH - Shoulder Flexion  Stand or sit with good posture. Grasp a __________ weight, or an exercise band or tubing, so that your hand is "thumbs-up," like when you shake hands.  Slowly lift your right / left arm as far as you can, without increasing any shoulder pain. At first, many people can only raise their hand to shoulder height.  Avoid shrugging your right / left shoulder as your arm rises, by keeping your shoulder blade tucked down and toward your mid-back spine.  Hold for __________ seconds. Control the descent of your hand as you slowly return to your starting position. Repeat __________ times. Complete this exercise __________ times per day. Document Released: 10/25/2005 Document Revised: 01/17/2012 Document Reviewed: 02/06/2009 [facility] Patient Information 2014 [place], [place].

## 2013-12-27 NOTE — Progress Notes (Signed)
   Subjective:    Patient ID: [name], female    DOB: 30-Sep-1946, 68 y.o.   MRN: [id number]  Arm Pain  Patient was seen in the office 2 weeks ago for similar complaint, given dexamethasone shot at that time. She is taking the norco 2-3 a day which helps.  She states the shot has helped some but she continues to be limited by her right shoulder.  There was no injury mechanism.  The quality of the pain is described as shooting, aching and stabbing. The pain radiates to the right arm. The pain is moderate. The pain has been intermittent since the incident. Pertinent negatives include no chest pain, muscle weakness, numbness or tingling. The symptoms are aggravated by movement, lifting and palpation. Treatments tried: biofreeze, advil, meloxicam, and pain pill from friend. The treatment provided mild relief. Better with rest/no moveement.    Review of Systems  Constitutional: Negative.   HENT: Negative.   Respiratory: Negative.   Cardiovascular: Negative.  Negative for chest pain.  Gastrointestinal: Negative.   Genitourinary: Negative.   Musculoskeletal: Positive for arthralgias and myalgias.  Neurological: Negative.  Negative for tingling and numbness.  Hematological: Negative.        Objective:   Physical Exam  Constitutional: She is oriented to person, place, and time. She appears well-developed and well-nourished.  HENT:  Head: Normocephalic and atraumatic.  Neck: Normal range of motion. Neck supple.  Cardiovascular: Normal rate and regular rhythm.   Pulmonary/Chest: Effort normal and breath sounds normal.  Abdominal: Soft. Bowel sounds are normal.  Musculoskeletal:       Right shoulder: She exhibits decreased range of motion (limtied abduction to 90 degree active and 120  passive), tenderness (At subacromial bursa and biceps tendon), crepitus, pain and spasm. She exhibits no bony tenderness, no swelling, no effusion, no deformity, no laceration, normal pulse and normal  strength.  Distal sensation intact.   Lymphadenopathy:    She has no cervical adenopathy.  Neurological: She is alert and oriented to person, place, and time. She has normal reflexes.  Skin: Skin is warm and dry. No rash noted.   A trigger point injection was performed at the site of maximal tenderness at anterior right shoulder using 1% plain Lidocaine and Dexamethasone 10mg . This was well tolerated, and followed by immediate relief of pain.     Assessment & Plan:  Right Subacromial Bursitis/Bicep tendonitis Physical therapy Prednisone taper [facility] Orth[facility] referral Continue norco PRN

## 2014-01-07 ENCOUNTER — Ambulatory Visit (HOSPITAL_COMMUNITY)
Admission: RE | Admit: 2014-01-07 | Discharge: 2014-01-07 | Disposition: A | Discharge status: Home / Self Care | Payer: Medicare HMO | Source: Ambulatory Visit | Attending: Internal Medicine | Admitting: Internal Medicine

## 2014-01-07 DIAGNOSIS — Z1231 Encounter for screening mammogram for malignant neoplasm of breast: Secondary | ICD-10-CM | POA: Insufficient documentation

## 2014-01-22 ENCOUNTER — Encounter: Payer: Self-pay | Admitting: Internal Medicine

## 2014-01-22 ENCOUNTER — Ambulatory Visit (INDEPENDENT_AMBULATORY_CARE_PROVIDER_SITE_OTHER): Payer: Medicare HMO | Admitting: Internal Medicine

## 2014-01-22 VITALS — BP 158/94 | HR 56 | Temp 97.9°F | Resp 18 | Ht 62.0 in | Wt 171.2 lb

## 2014-01-22 DIAGNOSIS — E039 Hypothyroidism, unspecified: Secondary | ICD-10-CM

## 2014-01-22 DIAGNOSIS — M948X9 Other specified disorders of cartilage, unspecified sites: Secondary | ICD-10-CM

## 2014-01-22 DIAGNOSIS — E785 Hyperlipidemia, unspecified: Secondary | ICD-10-CM

## 2014-01-22 DIAGNOSIS — R7309 Other abnormal glucose: Secondary | ICD-10-CM

## 2014-01-22 DIAGNOSIS — I10 Essential (primary) hypertension: Secondary | ICD-10-CM

## 2014-01-22 DIAGNOSIS — Z79899 Other long term (current) drug therapy: Secondary | ICD-10-CM | POA: Insufficient documentation

## 2014-01-22 DIAGNOSIS — E559 Vitamin D deficiency, unspecified: Secondary | ICD-10-CM | POA: Insufficient documentation

## 2014-01-22 DIAGNOSIS — Z1212 Encounter for screening for malignant neoplasm of rectum: Secondary | ICD-10-CM

## 2014-01-22 DIAGNOSIS — Z0000 Encounter for general adult medical examination without abnormal findings: Secondary | ICD-10-CM

## 2014-01-22 LAB — CBC WITH DIFFERENTIAL/PLATELET
Basophils Absolute: 0 10*3/uL (ref 0.0–0.1)
Basophils Relative: 1 % (ref 0–1)
EOS ABS: 0.3 10*3/uL (ref 0.0–0.7)
Eosinophils Relative: 6 % — ABNORMAL HIGH (ref 0–5)
HCT: 39.6 % (ref 36.0–46.0)
HEMOGLOBIN: 13.5 g/dL (ref 12.0–15.0)
Lymphocytes Relative: 32 % (ref 12–46)
Lymphs Abs: 1.4 10*3/uL (ref 0.7–4.0)
MCH: 30 pg (ref 26.0–34.0)
MCHC: 34.1 g/dL (ref 30.0–36.0)
MCV: 88 fL (ref 78.0–100.0)
MONOS PCT: 11 % (ref 3–12)
Monocytes Absolute: 0.5 10*3/uL (ref 0.1–1.0)
NEUTROS ABS: 2.3 10*3/uL (ref 1.7–7.7)
Neutrophils Relative %: 50 % (ref 43–77)
Platelets: 129 10*3/uL — ABNORMAL LOW (ref 150–400)
RBC: 4.5 MIL/uL (ref 3.87–5.11)
RDW: 15.1 % (ref 11.5–15.5)
WBC: 4.5 10*3/uL (ref 4.0–10.5)

## 2014-01-22 LAB — HEMOGLOBIN A1C
HEMOGLOBIN A1C: 5.8 % — AB (ref <5.7)
Mean Plasma Glucose: 120 mg/dL — ABNORMAL HIGH (ref <117)

## 2014-01-22 MED ORDER — ATENOLOL 25 MG PO TABS: 25.0000 mg | ORAL_TABLET | Freq: Every day | ORAL | Status: DC

## 2014-01-22 MED ORDER — LOSARTAN POTASSIUM 50 MG PO TABS: ORAL_TABLET | ORAL | Status: DC

## 2014-01-22 MED ORDER — PRAVASTATIN SODIUM 10 MG PO TABS: 10.0000 mg | ORAL_TABLET | Freq: Every day | ORAL | Status: DC

## 2014-01-22 MED ORDER — ALENDRONATE SODIUM 70 MG PO TABS
70.0000 mg | ORAL_TABLET | ORAL | Status: DC
Start: 2014-01-22 — End: 2014-01-29

## 2014-01-22 MED ORDER — LEVOTHYROXINE SODIUM 150 MCG PO TABS: 150.0000 ug | ORAL_TABLET | Freq: Every day | ORAL | Status: DC

## 2014-01-22 MED ORDER — HYDROCODONE-ACETAMINOPHEN 5-325 MG PO TABS: 1.0000 | ORAL_TABLET | Freq: Four times a day (QID) | ORAL | Status: DC | PRN

## 2014-01-22 NOTE — Patient Instructions (Addendum)
 Hypertension As your heart beats, it forces blood through your arteries. This force is your blood pressure. If the pressure is too high, it is called hypertension (HTN) or high blood pressure. HTN is dangerous because you may have it and not know it. High blood pressure may mean that your heart has to work harder to pump blood. Your arteries may be narrow or stiff. The extra work puts you at risk for heart disease, stroke, and other problems.  Blood pressure consists of two numbers, a higher number over a lower, 110/72, for example. It is stated as "110 over 72." The ideal is below 120 for the top number (systolic) and under 80 for the bottom (diastolic). Write down your blood pressure today. You should pay close attention to your blood pressure if you have certain conditions such as:  Heart failure.  Prior heart attack.  Diabetes  Chronic kidney disease.  Prior stroke.  Multiple risk factors for heart disease. To see if you have HTN, your blood pressure should be measured while you are seated with your arm held at the level of the heart. It should be measured at least twice. A one-time elevated blood pressure reading (especially in the Emergency Department) does not mean that you need treatment. There may be conditions in which the blood pressure is different between your right and left arms. It is important to see your caregiver soon for a recheck. Most people have essential hypertension which means that there is not a specific cause. This type of high blood pressure may be lowered by changing lifestyle factors such as:  Stress.  Smoking.  Lack of exercise.  Excessive weight.  Drug/tobacco/alcohol use.  Eating less salt. Most people do not have symptoms from high blood pressure until it has caused damage to the body. Effective treatment can often prevent, delay or reduce that damage. TREATMENT  When a cause has been identified, treatment for high blood pressure is directed at  the cause. There are a large number of medications to treat HTN. These fall into several categories, and your caregiver will help you select the medicines that are best for you. Medications may have side effects. You should review side effects with your caregiver. If your blood pressure stays high after you have made lifestyle changes or started on medicines,   Your medication(s) may need to be changed.  Other problems may need to be addressed.  Be certain you understand your prescriptions, and know how and when to take your medicine.  Be sure to follow up with your caregiver within the time frame advised (usually within two weeks) to have your blood pressure rechecked and to review your medications.  If you are taking more than one medicine to lower your blood pressure, make sure you know how and at what times they should be taken. Taking two medicines at the same time can result in blood pressure that is too low. SEEK IMMEDIATE MEDICAL CARE IF:  You develop a severe headache, blurred or changing vision, or confusion.  You have unusual weakness or numbness, or a faint feeling.  You have severe chest or abdominal pain, vomiting, or breathing problems. MAKE SURE YOU:   Understand these instructions.  Will watch your condition.  Will get help right away if you are not doing well or get worse.   Diabetes and Exercise Exercising regularly is important. It is not just about losing weight. It has many health benefits, such as:  Improving your overall fitness, flexibility, and   endurance.  Increasing your bone density.  Helping with weight control.  Decreasing your body fat.  Increasing your muscle strength.  Reducing stress and tension.  Improving your overall health. People with diabetes who exercise gain additional benefits because exercise:  Reduces appetite.  Improves the body's use of blood sugar (glucose).  Helps lower or control blood glucose.  Decreases blood  pressure.  Helps control blood lipids (such as cholesterol and triglycerides).  Improves the body's use of the hormone insulin by:  Increasing the body's insulin sensitivity.  Reducing the body's insulin needs.  Decreases the risk for heart disease because exercising:  Lowers cholesterol and triglycerides levels.  Increases the levels of good cholesterol (such as high-density lipoproteins [HDL]) in the body.  Lowers blood glucose levels. YOUR ACTIVITY PLAN  Choose an activity that you enjoy and set realistic goals. Your health care provider or diabetes educator can help you make an activity plan that works for you. You can break activities into 2 or 3 sessions throughout the day. Doing so is as good as one long session. Exercise ideas include:  Taking the dog for a walk.  Taking the stairs instead of the elevator.  Dancing to your favorite song.  Doing your favorite exercise with a friend. RECOMMENDATIONS FOR EXERCISING WITH TYPE 1 OR TYPE 2 DIABETES   Check your blood glucose before exercising. If blood glucose levels are greater than 240 mg/dL, check for urine ketones. Do not exercise if ketones are present.  Avoid injecting insulin into areas of the body that are going to be exercised. For example, avoid injecting insulin into:  The arms when playing tennis.  The legs when jogging.  Keep a record of:  Food intake before and after you exercise.  Expected peak times of insulin action.  Blood glucose levels before and after you exercise.  The type and amount of exercise you have done.  Review your records with your health care provider. Your health care provider will help you to develop guidelines for adjusting food intake and insulin amounts before and after exercising.  If you take insulin or oral hypoglycemic agents, watch for signs and symptoms of hypoglycemia. They include:  Dizziness.  Shaking.  Sweating.  Chills.  Confusion.  Drink plenty of water  while you exercise to prevent dehydration or heat stroke. Body water is lost during exercise and must be replaced.  Talk to your health care provider before starting an exercise program to make sure it is safe for you. Remember, almost any type of activity is better than none.    Cholesterol Cholesterol is a white, waxy, fat-like protein needed by your body in small amounts. The liver makes all the cholesterol you need. It is carried from the liver by the blood through the blood vessels. Deposits (plaque) may build up on blood vessel walls. This makes the arteries narrower and stiffer. Plaque increases the risk for heart attack and stroke. You cannot feel your cholesterol level even if it is very high. The only way to know is by a blood test to check your lipid (fats) levels. Once you know your cholesterol levels, you should keep a record of the test results. Work with your caregiver to to keep your levels in the desired range. WHAT THE RESULTS MEAN:  Total cholesterol is a rough measure of all the cholesterol in your blood.  LDL is the so-called bad cholesterol. This is the type that deposits cholesterol in the walls of the arteries. You want this   level to be low.  HDL is the good cholesterol because it cleans the arteries and carries the LDL away. You want this level to be high.  Triglycerides are fat that the body can either burn for energy or store. High levels are closely linked to heart disease. DESIRED LEVELS:  Total cholesterol below 200.  LDL below 100 for people at risk, below 70 for very high risk.  HDL above 50 is good, above 60 is best.  Triglycerides below 150. HOW TO LOWER YOUR CHOLESTEROL:  Diet.  Choose fish or white meat chicken and turkey, roasted or baked. Limit fatty cuts of red meat, fried foods, and processed meats, such as sausage and lunch meat.  Eat lots of fresh fruits and vegetables. Choose whole grains, beans, pasta, potatoes and cereals.  Use only  small amounts of olive, corn or canola oils. Avoid butter, mayonnaise, shortening or palm kernel oils. Avoid foods with trans-fats.  Use skim/nonfat milk and low-fat/nonfat yogurt and cheeses. Avoid whole milk, cream, ice cream, egg yolks and cheeses. Healthy desserts include angel food cake, ginger snaps, animal crackers, hard candy, popsicles, and low-fat/nonfat frozen yogurt. Avoid pastries, cakes, pies and cookies.  Exercise.  A regular program helps decrease LDL and raises HDL.  Helps with weight control.  Do things that increase your activity level like gardening, walking, or taking the stairs.  Medication.  May be prescribed by your caregiver to help lowering cholesterol and the risk for heart disease.  You may need medicine even if your levels are normal if you have several risk factors. HOME CARE INSTRUCTIONS   Follow your diet and exercise programs as suggested by your caregiver.  Take medications as directed.  Have blood work done when your caregiver feels it is necessary. MAKE SURE YOU:   Understand these instructions.  Will watch your condition.  Will get help right away if you are not doing well or get worse.      Vitamin D Deficiency Vitamin D is an important vitamin that your body needs. Having too little of it in your body is called a deficiency. A very bad deficiency can make your bones soft and can cause a condition called rickets.  Vitamin D is important to your body for different reasons, such as:   It helps your body absorb 2 minerals called calcium and phosphorus.  It helps make your bones healthy.  It may prevent some diseases, such as diabetes and multiple sclerosis.  It helps your muscles and heart. You can get vitamin D in several ways. It is a natural part of some foods. The vitamin is also added to some dairy products and cereals. Some people take vitamin D supplements. Also, your body makes vitamin D when you are in the sun. It changes the  sun's rays into a form of the vitamin that your body can use. CAUSES   Not eating enough foods that contain vitamin D.  Not getting enough sunlight.  Having certain digestive system diseases that make it hard to absorb vitamin D. These diseases include Crohn's disease, chronic pancreatitis, and cystic fibrosis.  Having a surgery in which part of the stomach or small intestine is removed.  Being obese. Fat cells pull vitamin D out of your blood. That means that obese people may not have enough vitamin D left in their blood and in other body tissues.  Having chronic kidney or liver disease. RISK FACTORS Risk factors are things that make you more likely to develop a vitamin   D deficiency. They include:  Being older.  Not being able to get outside very much.  Living in a nursing home.  Having had broken bones.  Having weak or thin bones (osteoporosis).  Having a disease or condition that changes how your body absorbs vitamin D.  Having dark skin.  Some medicines such as seizure medicines or steroids.  Being overweight or obese. SYMPTOMS Mild cases of vitamin D deficiency may not have any symptoms. If you have a very bad case, symptoms may include:  Bone pain.  Muscle pain.  Falling often.  Broken bones caused by a minor injury, due to osteoporosis. DIAGNOSIS A blood test is the best way to tell if you have a vitamin D deficiency. TREATMENT Vitamin D deficiency can be treated in different ways. Treatment for vitamin D deficiency depends on what is causing it. Options include:  Taking vitamin D supplements.  Taking a calcium supplement. Your caregiver will suggest what dose is best for you. HOME CARE INSTRUCTIONS  Take any supplements that your caregiver prescribes. Follow the directions carefully. Take only the suggested amount.  Have your blood tested 2 months after you start taking supplements.  Eat foods that contain vitamin D. Healthy choices  include:  Fortified dairy products, cereals, or juices. Fortified means vitamin D has been added to the food. Check the label on the package to be sure.  Fatty fish like salmon or trout.  Eggs.  Oysters.  Do not use a tanning bed.  Keep your weight at a healthy level. Lose weight if you need to.  Keep all follow-up appointments. Your caregiver will need to perform blood tests to make sure your vitamin D deficiency is going away. SEEK MEDICAL CARE IF:  You have any questions about your treatment.  You continue to have symptoms of vitamin D deficiency.  You have nausea or vomiting.  You are constipated.  You feel confused.  You have severe abdominal or back pain. MAKE SURE YOU:  Understand these instructions.  Will watch your condition.  Will get help right away if you are not doing well or get worse.  Hypothyroidism The thyroid is a large gland located in the lower front of your neck. The thyroid gland helps control metabolism. Metabolism is how your body handles food. It controls metabolism with the hormone thyroxine. When this gland is underactive (hypothyroid), it produces too little hormone.  CAUSES These include:   Absence or destruction of thyroid tissue.  Goiter due to iodine deficiency.  Goiter due to medications.  Congenital defects (since birth).  Problems with the pituitary. This causes a lack of TSH (thyroid stimulating hormone). This hormone tells the thyroid to turn out more hormone. SYMPTOMS  Lethargy (feeling as though you have no energy)  Cold intolerance  Weight gain (in spite of normal food intake)  Dry skin  Coarse hair  Menstrual irregularity (if severe, may lead to infertility)  Slowing of thought processes Cardiac problems are also caused by insufficient amounts of thyroid hormone. Hypothyroidism in the newborn is cretinism, and is an extreme form. It is important that this form be treated adequately and immediately or it will  lead rapidly to retarded physical and mental development. DIAGNOSIS  To prove hypothyroidism, your caregiver may do blood tests and ultrasound tests. Sometimes the signs are hidden. It may be necessary for your caregiver to watch this illness with blood tests either before or after diagnosis and treatment. TREATMENT  Low levels of thyroid hormone are increased by using  synthetic thyroid hormone. This is a safe, effective treatment. It usually takes about four weeks to gain the full effects of the medication. After you have the full effect of the medication, it will generally take another four weeks for problems to leave. Your caregiver may start you on low doses. If you have had heart problems the dose may be gradually increased. It is generally not an emergency to get rapidly to normal. HOME CARE INSTRUCTIONS   Take your medications as your caregiver suggests. Let your caregiver know of any medications you are taking or start taking. Your caregiver will help you with dosage schedules.  As your condition improves, your dosage needs may increase. It will be necessary to have continuing blood tests as suggested by your caregiver.  Report all suspected medication side effects to your caregiver. SEEK MEDICAL CARE IF: Seek medical care if you develop:  Sweating.  Tremulousness (tremors).  Anxiety.  Rapid weight loss.  Heat intolerance.  Emotional swings.  Diarrhea.  Weakness. SEEK IMMEDIATE MEDICAL CARE IF:  You develop chest pain, an irregular heart beat (palpitations), or a rapid heart beat. MAKE SURE YOU:   Understand these instructions.  Will watch your condition.  Will get help right away if you are not doing well or get worse. Document Released: 10/25/2005 Document Revised: 01/17/2012 Document Reviewed: 06/14/2008 [facility] Patient Information 2014 [place].

## 2014-01-22 NOTE — Progress Notes (Signed)
Patient ID: [name], female   DOB: February 28, 1946, 68 y.o.   MRN: [id number]   Annual Screening Comprehensive Examination  This very nice 68 y.o. MWF presents for complete physical.  Patient has been followed for HTN, Hx/o Abnormal Glucose, Hyperlipidemia, Rheumatoid Arthritis and Vitamin D Deficiency.    HTN predates since 30. Patient's BP has been controlled at home. Today's BP: 158/94 mmHg. Patient denies any cardiac symptoms as chest pain, palpitations, shortness of breath, dizziness or ankle swelling.   Patient's hyperlipidemia is controlled with diet and medications as listed below. Patient denies myalgias or other medication SE's. Lab Results  Component Value Date   CHOL 149 10/22/2013   HDL 61 10/22/2013   LDLCALC 65 10/22/2013   TRIG 117 10/22/2013   CHOLHDL 2.4 10/22/2013    Patient has Hx/o PreDiabetes with A1c 5.8% in 2011and last A1c was 5.2% in Sept 2014.Marland Kitchen Patient denies reactive hypoglycemic symptoms, visual blurring, diabetic polys, or paresthesias.    Patient has Rheumatoid Arthritis initially diagnosed in 1988 and has done surprisingly well over the years and controlled with MTX  and Plaquenil and more recently followed by Dr Gavin Pound   Finally, patient has history of Vitamin D Deficiency of 38 in 2008 with last vitamin D 104 in Sept 2014.   Medication Sig  . ALPRAZolam (XANAX) 1 MG tablet Take 1 mg by mouth daily.    . Cholecalciferol (VITAMIN D PO) Take 5,000 Units by mouth daily.   . folic acid (FOLVITE) 1 MG tablet Take 2 mg by mouth daily.   . hydroxychloroquine (PLAQUENIL) 200 MG tablet Take by mouth daily. Take 1 tab AM and PM  . Methotrexate Sodium (METHOTREXATE PO) Take 2.5 mg by mouth. 10 pills weekly  . omeprazole (PRILOSEC) 40 MG capsule Take 40 mg by mouth daily.     Allergies  Allergen Reactions  . Ace Inhibitors Cough  . Ambien [Zolpidem] Other (See Comments)    Odd Feeling  . Crestor [Rosuvastatin]   . Pravastatin   . Prozac  [Fluoxetine Hcl] Other (See Comments)    Decreased libido  . Zoloft [Sertraline Hcl]     Past Medical History  Diagnosis Date  . Hyperlipidemia   . Hypothyroid   . Anemia     Hx of   . H. pylori infection     Hx of   . GERD (gastroesophageal reflux disease)   . PUD (peptic ulcer disease)   . Calculus of bile duct without mention of cholecystitis or obstruction   . Arthritis   . Rheumatoid arthritis(714.0)   . Hypertension   . Cataract   . Heart murmur     Past Surgical History  Procedure Laterality Date  . Vaginal hysterectomy      ovaries not removed  . Carpal tunnel release    . Cystectomy      left breast  . Av fistula repair    . Bladder suspension    . Pilonidal cyst excision    . Trigger finger release    . Bunionectomy    . Ercp  09/29/2011    Procedure: ENDOSCOPIC RETROGRADE CHOLANGIOPANCREATOGRAPHY (ERCP);  Surgeon: Inda Castle, MD;  Location: [facility];  Service: Endoscopy;  Laterality: N/A;  . Tonsillectomy    . Cholecystectomy  09/07/2011  . Tenolysis  10/26/2011    Procedure: TENDON SHEATH RELEASE/TENOLYSIS;  Surgeon: Wynonia Sours, MD;  Location: [facility];  Service: Orthopedics;  Laterality: Right;  tenosynovectomy removal superficialis slip right  index finger    Family History  Problem Relation Age of Onset  . Lung cancer Mother   . Stomach cancer Father   . Heart attack Father     MI at age 54  . Colon cancer Maternal Grandfather   . Heart attack Brother     MI at age 11  . Prostate cancer Paternal Grandfather     History  Substance Use Topics  . Smoking status: Former Smoker    Quit date: 10/25/1995  . Smokeless tobacco: Never Used     Comment: Maryann Conners [id number]  . Alcohol Use: Yes     Comment: occasional    ROS Constitutional: Denies fever, chills, weight loss/gain, headaches, insomnia, fatigue, night sweats, and change in appetite. Eyes: Denies redness, blurred vision, diplopia, discharge, itchy, watery eyes.  ENT:  Denies discharge, congestion, post nasal drip, epistaxis, sore throat, earache, hearing loss, dental pain, Tinnitus, Vertigo, Sinus pain, snoring.  Cardio: Denies chest pain, palpitations, irregular heartbeat, syncope, dyspnea, diaphoresis, orthopnea, PND, claudication, edema Respiratory: denies cough, dyspnea, DOE, pleurisy, hoarseness, laryngitis, wheezing.  Gastrointestinal: Denies dysphagia, heartburn, reflux, water brash, pain, cramps, nausea, vomiting, bloating, diarrhea, constipation, hematemesis, melena, hematochezia, jaundice, hemorrhoids Genitourinary: Denies dysuria, frequency, urgency, nocturia, hesitancy, discharge, hematuria, flank pain Breast:Breast lumps, nipple discharge, bleeding.  Musculoskeletal: Denies arthralgia, myalgia, stiffness, Jt. Swelling, pain, limp, and strain/sprain. Skin: Denies puritis, rash, hives, warts, acne, eczema, changing in skin lesion Neuro: No weakness, tremor, incoordination, spasms, paresthesia, pain Psychiatric: Denies confusion, memory loss, sensory loss Endocrine: Denies change in weight, skin, hair change, nocturia, and paresthesia, diabetic polys, visual blurring, hyper / hypo glycemic episodes.  Heme/Lymph: No excessive bleeding, bruising, enlarged lymph nodes.   Physical Exam  BP 158/94  Pulse 56  Temp(Src) 97.9 F (36.6 C) (Temporal)  Resp 18  Ht 5\' 2"  (1.575 m)  Wt 171 lb 3.2 oz (77.656 kg)  BMI 31.31 kg/m2  General Appearance: Well nourished, in no apparent distress. Eyes: PERRLA, EOMs, conjunctiva no swelling or erythema, normal fundi and vessels. Sinuses: No frontal/maxillary tenderness ENT/Mouth: EACs patent / TMs  nl. Nares clear without erythema, swelling, mucoid exudates. Oral hygiene is good. No erythema, swelling, or exudate. Tongue normal, non-obstructing. Tonsils not swollen or erythematous. Hearing normal.  Neck: Supple, thyroid normal. No bruits, nodes or JVD. Respiratory: Respiratory effort normal.  BS equal and clear  bilateral without rales, rhonci, wheezing or stridor. Cardio: Heart sounds are normal with regular rate and rhythm and no murmurs, rubs or gallops. Peripheral pulses are normal and equal bilaterally without edema. No aortic or femoral bruits. Chest: symmetric with normal excursions and percussion. Breasts: Symmetric, without lumps, nipple discharge, retractions, or fibrocystic changes.  Abdomen: Flat, soft, with bowl sounds. Nontender, no guarding, rebound, hernias, masses, or organomegaly.  Lymphatics: Non tender without lymphadenopathy.  Genitourinary:  Musculoskeletal: Full ROM all peripheral extremities, joint stability, 5/5 strength, and normal gait. Skin: Warm and dry without rashes, lesions, cyanosis, clubbing or  ecchymosis.  Neuro: Cranial nerves intact, reflexes equal bilaterally. Normal muscle tone, no cerebellar symptoms. Sensation intact.  Pysch: Awake and oriented X 3, normal affect, Insight and Judgment appropriate.   Assessment and Plan  1. Annual Screening Examination 2. Hypertension  3. Hyperlipidemia 4. Abnormal Glucose 5. Vitamin D Deficiency 6. Rheumatoid Arthritis 7. Anemia of Chronic Disease  Continue prudent diet as discussed, weight control, BP monitoring, regular exercise, and medications. Discussed med's effects and SE's. Screening labs and tests as requested with regular follow-up as recommended.

## 2014-01-23 LAB — LIPID PANEL
Cholesterol: 166 mg/dL (ref 0–200)
HDL: 67 mg/dL (ref >39)
LDL CALC: 81 mg/dL (ref 0–99)
TRIGLYCERIDES: 92 mg/dL (ref <150)
Total CHOL/HDL Ratio: 2.5 Ratio
VLDL: 18 mg/dL (ref 0–40)

## 2014-01-23 LAB — MICROALBUMIN / CREATININE URINE RATIO
Creatinine, Urine: 58 mg/dL
MICROALB/CREAT RATIO: 8.6 mg/g (ref 0.0–30.0)
Microalb, Ur: 0.5 mg/dL (ref 0.00–1.89)

## 2014-01-23 LAB — HEPATIC FUNCTION PANEL
ALT: 18 U/L (ref 0–35)
AST: 28 U/L (ref 0–37)
Albumin: 4.2 g/dL (ref 3.5–5.2)
Alkaline Phosphatase: 84 U/L (ref 39–117)
BILIRUBIN DIRECT: 0.2 mg/dL (ref 0.0–0.3)
BILIRUBIN TOTAL: 0.7 mg/dL (ref 0.2–1.2)
Indirect Bilirubin: 0.5 mg/dL (ref 0.2–1.2)
Total Protein: 6.1 g/dL (ref 6.0–8.3)

## 2014-01-23 LAB — BASIC METABOLIC PANEL WITH GFR
BUN: 6 mg/dL (ref 6–23)
CO2: 31 mEq/L (ref 19–32)
Calcium: 10.5 mg/dL (ref 8.4–10.5)
Chloride: 105 mEq/L (ref 96–112)
Creat: 0.86 mg/dL (ref 0.50–1.10)
GFR, Est African American: 81 mL/min
GFR, Est Non African American: 71 mL/min
GLUCOSE: 87 mg/dL (ref 70–99)
Potassium: 4.1 mEq/L (ref 3.5–5.3)
Sodium: 142 mEq/L (ref 135–145)

## 2014-01-23 LAB — MAGNESIUM: Magnesium: 1.9 mg/dL (ref 1.5–2.5)

## 2014-01-23 LAB — URINALYSIS, MICROSCOPIC ONLY
Casts: NONE SEEN
Crystals: NONE SEEN
Squamous Epithelial / LPF: NONE SEEN

## 2014-01-23 LAB — TSH: TSH: 5.557 u[IU]/mL — ABNORMAL HIGH (ref 0.350–4.500)

## 2014-01-23 LAB — INSULIN, FASTING: Insulin fasting, serum: 10 u[IU]/mL (ref 3–28)

## 2014-01-23 LAB — VITAMIN D 25 HYDROXY (VIT D DEFICIENCY, FRACTURES): Vit D, 25-Hydroxy: 114 ng/mL — ABNORMAL HIGH (ref 30–89)

## 2014-01-28 ENCOUNTER — Telehealth: Payer: Self-pay

## 2014-01-28 NOTE — Telephone Encounter (Signed)
Erin aware that pt may take the Celebrex if she stays on daily PPI.

## 2014-01-28 NOTE — Telephone Encounter (Signed)
Prescribe what? If she is asking about prescribing an NSAID or cox-2 drug, it is ok to prescribe as long as she remains on a daily PPI. Per RK notes she was on Protonix 40 mg daily last year.

## 2014-01-28 NOTE — Telephone Encounter (Signed)
Sorry, she wants to prescribe Celebrex if ok.

## 2014-01-28 NOTE — Telephone Encounter (Signed)
Received call from Marella Chimes [place] [phone]) with [facility]. Pt has history of peptic ulcer disease and she wants to know if it would be ok to prescribe this for the pt, she is having problems with her arthritis. Dr. Fuller Plan as doc of the day please advise.

## 2014-01-28 NOTE — Telephone Encounter (Signed)
That is a cox-2 drug, so as per my prior note.

## 2014-01-29 ENCOUNTER — Other Ambulatory Visit: Payer: Self-pay | Admitting: *Deleted

## 2014-01-29 DIAGNOSIS — E785 Hyperlipidemia, unspecified: Secondary | ICD-10-CM

## 2014-01-29 DIAGNOSIS — M948X9 Other specified disorders of cartilage, unspecified sites: Secondary | ICD-10-CM

## 2014-01-29 MED ORDER — PRAVASTATIN SODIUM 10 MG PO TABS: 10.0000 mg | ORAL_TABLET | Freq: Every day | ORAL | Status: DC

## 2014-01-29 MED ORDER — ALENDRONATE SODIUM 70 MG PO TABS
70.0000 mg | ORAL_TABLET | ORAL | Status: AC
Start: 2014-01-29 — End: 2015-01-30

## 2014-02-04 ENCOUNTER — Other Ambulatory Visit: Payer: Self-pay | Admitting: Internal Medicine

## 2014-02-04 DIAGNOSIS — E785 Hyperlipidemia, unspecified: Secondary | ICD-10-CM

## 2014-02-04 MED ORDER — PRAVASTATIN SODIUM 10 MG PO TABS: 10.0000 mg | ORAL_TABLET | Freq: Every day | ORAL | Status: DC

## 2014-02-05 ENCOUNTER — Encounter: Payer: Self-pay | Admitting: Cardiology

## 2014-02-05 ENCOUNTER — Ambulatory Visit (INDEPENDENT_AMBULATORY_CARE_PROVIDER_SITE_OTHER): Payer: Medicare HMO | Admitting: Cardiology

## 2014-02-05 VITALS — BP 140/78 | HR 60 | Ht 62.0 in | Wt 175.0 lb

## 2014-02-05 DIAGNOSIS — R55 Syncope and collapse: Secondary | ICD-10-CM

## 2014-02-05 DIAGNOSIS — E785 Hyperlipidemia, unspecified: Secondary | ICD-10-CM

## 2014-02-05 DIAGNOSIS — I10 Essential (primary) hypertension: Secondary | ICD-10-CM

## 2014-02-05 NOTE — Patient Instructions (Addendum)
Your physician wants you to follow-up in: 6 MONTHS WITH DR CRENSHAW You will receive a reminder letter in the mail two months in advance. If you don't receive a letter, please call our office to schedule the follow-up appointment.  

## 2014-02-05 NOTE — Assessment & Plan Note (Signed)
Management per primary care. 

## 2014-02-05 NOTE — Assessment & Plan Note (Signed)
No recurrent episodes.orthostatic symptoms much improved following discontinuation of HCTZ. We again discussed the importance of increased by mouth fluid intake as well as sodium intake. We can consider lowering other blood pressure medications in the future if her symptoms worsen. We will allow her blood pressure to run higher given orthostatic symptoms.

## 2014-02-05 NOTE — Progress Notes (Signed)
HPI: FU syncope. I initially saw her in 2012 for a murmur. Echocardiogram in December of 2012 showed normal LV function, grade 1 diastolic dysfunction and mild mitral regurgitation. Renal Dopplers in November of 2013 showed no renal artery stenosis. Holter monitor in December of 2014 showed sinus rhythm with occasional PACs. Laboratories in December of 2014 showed normal hemoglobin, potassium and TSH. Over the past several years she has had dizzy spells and occasional syncope. Each episode occurs after standing up from the lying or sitting position. I last saw her in Feb 2015 and DCed HCTZ and asked her to increase fluid/Na intake. Since then, she has mild dyspnea on exertion. No orthopnea, PND or pedal edema. No chest pain. Her dizziness with standing is much improved. No syncope.   Current Outpatient Prescriptions  Medication Sig Dispense Refill  . alendronate (FOSAMAX) 70 MG tablet Take 1 tablet (70 mg total) by mouth once a week.  12 tablet  3  . ALPRAZolam (XANAX) 1 MG tablet Take 1 mg by mouth daily.        Marland Kitchen atenolol (TENORMIN) 25 MG tablet Take 1 tablet (25 mg total) by mouth daily. For BP  90 tablet  99  . Cholecalciferol (VITAMIN D PO) Take 5,000 Units by mouth daily.       . folic acid (FOLVITE) 1 MG tablet Take 2 mg by mouth daily.       Marland Kitchen HYDROcodone-acetaminophen (NORCO) 5-325 MG per tablet Take 1 tablet by mouth every 6 (six) hours as needed for moderate pain.  90 tablet  0  . hydroxychloroquine (PLAQUENIL) 200 MG tablet Take by mouth daily. Take 1 tab AM and PM      . levothyroxine (SYNTHROID, LEVOTHROID) 150 MCG tablet Take 1 tablet (150 mcg total) by mouth daily.  90 tablet  99  . losartan (COZAAR) 50 MG tablet Take 1 tablet daily for BP  90 tablet  99  . Methotrexate Sodium (METHOTREXATE PO) Take 2.5 mg by mouth. 10 pills weekly      . omeprazole (PRILOSEC) 40 MG capsule TAKE 1 CAPSULE TWICE DAILY  FOR  ACID  REFLUX  180 capsule  3  . pravastatin (PRAVACHOL) 10 MG tablet  Take 1 tablet (10 mg total) by mouth daily. For Cholesterol  90 tablet  3   No current facility-administered medications for this visit.     Past Medical History  Diagnosis Date  . Hyperlipidemia   . Hypothyroid   . Anemia     Hx of   . H. pylori infection     Hx of   . GERD (gastroesophageal reflux disease)   . PUD (peptic ulcer disease)   . Calculus of bile duct without mention of cholecystitis or obstruction   . Arthritis   . Rheumatoid arthritis(714.0)   . Hypertension   . Cataract   . Heart murmur     Past Surgical History  Procedure Laterality Date  . Vaginal hysterectomy      ovaries not removed  . Carpal tunnel release    . Cystectomy      left breast  . Av fistula repair    . Bladder suspension    . Pilonidal cyst excision    . Trigger finger release    . Bunionectomy    . Ercp  09/29/2011    Procedure: ENDOSCOPIC RETROGRADE CHOLANGIOPANCREATOGRAPHY (ERCP);  Surgeon: Inda Castle, MD;  Location: [facility];  Service: Endoscopy;  Laterality: N/A;  . Tonsillectomy    .  Cholecystectomy  09/07/2011  . Tenolysis  10/26/2011    Procedure: TENDON SHEATH RELEASE/TENOLYSIS;  Surgeon: Wynonia Sours, MD;  Location: [facility];  Service: Orthopedics;  Laterality: Right;  tenosynovectomy removal superficialis slip right index finger    History   Social History  . Marital Status: Married    Spouse Name: N/A    Number of Children: 2  . Years of Education: N/A   Occupational History  . retired     Retired   Social History Main Topics  . Smoking status: Former Smoker    Quit date: 10/25/1995  . Smokeless tobacco: Never Used     Comment: [name] [id number]  . Alcohol Use: Yes     Comment: occasional  . Drug Use: No  . Sexual Activity: Yes    Birth Control/ Protection: Post-menopausal   Other Topics Concern  . Not on file   Social History Narrative  . No narrative on file    ROS: no fevers or chills, productive cough, hemoptysis, dysphasia,  odynophagia, melena, hematochezia, dysuria, hematuria, rash, seizure activity, orthopnea, PND, pedal edema, claudication. Remaining systems are negative.  Physical Exam: Well-developed well-nourished in no acute distress.  Skin is warm and dry.  HEENT is normal.  Neck is supple.  Chest is clear to auscultation with normal expansion.  Cardiovascular exam is regular rate and rhythm.  Abdominal exam nontender or distended. No masses palpated. Extremities show no edema. neuro grossly intact

## 2014-02-05 NOTE — Assessment & Plan Note (Signed)
Continue present medications. 

## 2014-02-11 DIAGNOSIS — M810 Age-related osteoporosis without current pathological fracture: Secondary | ICD-10-CM | POA: Insufficient documentation

## 2014-02-11 DIAGNOSIS — E039 Hypothyroidism, unspecified: Secondary | ICD-10-CM | POA: Insufficient documentation

## 2014-02-20 ENCOUNTER — Ambulatory Visit (INDEPENDENT_AMBULATORY_CARE_PROVIDER_SITE_OTHER): Payer: Medicare HMO

## 2014-02-20 DIAGNOSIS — E039 Hypothyroidism, unspecified: Secondary | ICD-10-CM

## 2014-02-20 LAB — TSH: TSH: 0.084 u[IU]/mL — ABNORMAL LOW (ref 0.350–4.500)

## 2014-02-20 NOTE — Progress Notes (Signed)
Patient ID: [name], female   DOB: 1946-08-24, 68 y.o.   MRN: [id number] Patient here today to recheck thyroid. Verified current dosage of Levothyroxine 150 mcg. Patient taking one and a half tablet on Monday, Wednesday, and Friday, takes one tablet Tuesday, Thursday, Saturday and Sunday.

## 2014-02-22 HISTORY — PX: TOTAL SHOULDER REPLACEMENT: SUR1217

## 2014-02-25 ENCOUNTER — Ambulatory Visit: Payer: Self-pay

## 2014-03-05 ENCOUNTER — Ambulatory Visit: Payer: Medicare HMO | Attending: Rehabilitation | Admitting: Physical Therapy

## 2014-03-05 DIAGNOSIS — M25619 Stiffness of unspecified shoulder, not elsewhere classified: Secondary | ICD-10-CM | POA: Insufficient documentation

## 2014-03-05 DIAGNOSIS — R609 Edema, unspecified: Secondary | ICD-10-CM | POA: Insufficient documentation

## 2014-03-05 DIAGNOSIS — M25519 Pain in unspecified shoulder: Secondary | ICD-10-CM | POA: Insufficient documentation

## 2014-03-05 DIAGNOSIS — IMO0001 Reserved for inherently not codable concepts without codable children: Secondary | ICD-10-CM | POA: Insufficient documentation

## 2014-03-05 DIAGNOSIS — R293 Abnormal posture: Secondary | ICD-10-CM | POA: Insufficient documentation

## 2014-03-05 DIAGNOSIS — R5381 Other malaise: Secondary | ICD-10-CM | POA: Insufficient documentation

## 2014-03-06 ENCOUNTER — Ambulatory Visit: Payer: Medicare HMO | Admitting: Physical Therapy

## 2014-03-07 DIAGNOSIS — Z96611 Presence of right artificial shoulder joint: Secondary | ICD-10-CM | POA: Insufficient documentation

## 2014-03-07 DIAGNOSIS — Z9660 Presence of unspecified orthopedic joint implant: Secondary | ICD-10-CM | POA: Insufficient documentation

## 2014-03-12 ENCOUNTER — Ambulatory Visit: Payer: Medicare HMO | Attending: Rehabilitation | Admitting: Physical Therapy

## 2014-03-12 ENCOUNTER — Encounter: Payer: Medicare HMO | Admitting: Physical Therapy

## 2014-03-12 DIAGNOSIS — Z5189 Encounter for other specified aftercare: Secondary | ICD-10-CM | POA: Insufficient documentation

## 2014-03-12 DIAGNOSIS — M25519 Pain in unspecified shoulder: Secondary | ICD-10-CM | POA: Insufficient documentation

## 2014-03-12 DIAGNOSIS — M25619 Stiffness of unspecified shoulder, not elsewhere classified: Secondary | ICD-10-CM | POA: Insufficient documentation

## 2014-03-12 DIAGNOSIS — R293 Abnormal posture: Secondary | ICD-10-CM | POA: Insufficient documentation

## 2014-03-12 DIAGNOSIS — R5381 Other malaise: Secondary | ICD-10-CM | POA: Insufficient documentation

## 2014-03-12 DIAGNOSIS — R609 Edema, unspecified: Secondary | ICD-10-CM | POA: Insufficient documentation

## 2014-03-14 ENCOUNTER — Ambulatory Visit: Payer: Medicare HMO | Admitting: Physical Therapy

## 2014-03-18 ENCOUNTER — Encounter: Payer: Medicare HMO | Admitting: Physical Therapy

## 2014-03-19 ENCOUNTER — Ambulatory Visit: Payer: Medicare HMO | Admitting: Physical Therapy

## 2014-03-21 ENCOUNTER — Ambulatory Visit: Payer: Medicare HMO | Admitting: Physical Therapy

## 2014-03-26 ENCOUNTER — Ambulatory Visit: Payer: Medicare HMO

## 2014-03-27 ENCOUNTER — Telehealth: Payer: Self-pay | Admitting: *Deleted

## 2014-03-27 NOTE — Telephone Encounter (Signed)
[place] 06/08/14

## 2014-03-28 ENCOUNTER — Ambulatory Visit: Payer: Medicare HMO | Admitting: Physical Therapy

## 2014-04-02 ENCOUNTER — Ambulatory Visit: Payer: Medicare HMO | Admitting: Physical Therapy

## 2014-04-02 NOTE — Telephone Encounter (Signed)
Faxed referral & got authorization from [facility].

## 2014-04-04 ENCOUNTER — Ambulatory Visit: Payer: Medicare HMO | Admitting: Physical Therapy

## 2014-04-09 ENCOUNTER — Ambulatory Visit: Payer: Medicare HMO | Attending: Rehabilitation | Admitting: Physical Therapy

## 2014-04-09 DIAGNOSIS — M25619 Stiffness of unspecified shoulder, not elsewhere classified: Secondary | ICD-10-CM | POA: Insufficient documentation

## 2014-04-09 DIAGNOSIS — R5381 Other malaise: Secondary | ICD-10-CM | POA: Insufficient documentation

## 2014-04-09 DIAGNOSIS — M25519 Pain in unspecified shoulder: Secondary | ICD-10-CM | POA: Insufficient documentation

## 2014-04-09 DIAGNOSIS — R609 Edema, unspecified: Secondary | ICD-10-CM | POA: Insufficient documentation

## 2014-04-09 DIAGNOSIS — R293 Abnormal posture: Secondary | ICD-10-CM | POA: Insufficient documentation

## 2014-04-09 DIAGNOSIS — Z5189 Encounter for other specified aftercare: Secondary | ICD-10-CM | POA: Insufficient documentation

## 2014-04-11 ENCOUNTER — Ambulatory Visit: Payer: Medicare HMO

## 2014-04-15 ENCOUNTER — Ambulatory Visit: Payer: Medicare HMO | Admitting: Physical Therapy

## 2014-04-18 ENCOUNTER — Ambulatory Visit: Payer: Medicare HMO | Admitting: Physical Therapy

## 2014-04-23 ENCOUNTER — Ambulatory Visit: Payer: Medicare HMO | Admitting: Physical Therapy

## 2014-04-29 ENCOUNTER — Ambulatory Visit (INDEPENDENT_AMBULATORY_CARE_PROVIDER_SITE_OTHER): Payer: Commercial Managed Care - HMO | Admitting: Physician Assistant

## 2014-04-29 ENCOUNTER — Encounter: Payer: Self-pay | Admitting: Physician Assistant

## 2014-04-29 ENCOUNTER — Ambulatory Visit: Payer: Medicare HMO | Admitting: Physical Therapy

## 2014-04-29 VITALS — BP 120/62 | HR 56 | Temp 98.1°F | Resp 16 | Ht 61.0 in | Wt 167.0 lb

## 2014-04-29 DIAGNOSIS — Z1331 Encounter for screening for depression: Secondary | ICD-10-CM

## 2014-04-29 DIAGNOSIS — Z0000 Encounter for general adult medical examination without abnormal findings: Secondary | ICD-10-CM

## 2014-04-29 DIAGNOSIS — I10 Essential (primary) hypertension: Secondary | ICD-10-CM

## 2014-04-29 DIAGNOSIS — Z79899 Other long term (current) drug therapy: Secondary | ICD-10-CM

## 2014-04-29 DIAGNOSIS — R7309 Other abnormal glucose: Secondary | ICD-10-CM

## 2014-04-29 DIAGNOSIS — K219 Gastro-esophageal reflux disease without esophagitis: Secondary | ICD-10-CM

## 2014-04-29 DIAGNOSIS — E559 Vitamin D deficiency, unspecified: Secondary | ICD-10-CM

## 2014-04-29 DIAGNOSIS — M069 Rheumatoid arthritis, unspecified: Secondary | ICD-10-CM

## 2014-04-29 DIAGNOSIS — E039 Hypothyroidism, unspecified: Secondary | ICD-10-CM

## 2014-04-29 DIAGNOSIS — E785 Hyperlipidemia, unspecified: Secondary | ICD-10-CM

## 2014-04-29 LAB — CBC WITH DIFFERENTIAL/PLATELET
Basophils Absolute: 0.1 10*3/uL (ref 0.0–0.1)
Basophils Relative: 1 % (ref 0–1)
EOS PCT: 5 % (ref 0–5)
Eosinophils Absolute: 0.3 10*3/uL (ref 0.0–0.7)
HEMATOCRIT: 37.2 % (ref 36.0–46.0)
Hemoglobin: 12.5 g/dL (ref 12.0–15.0)
LYMPHS ABS: 1.2 10*3/uL (ref 0.7–4.0)
LYMPHS PCT: 21 % (ref 12–46)
MCH: 29.1 pg (ref 26.0–34.0)
MCHC: 33.6 g/dL (ref 30.0–36.0)
MCV: 86.5 fL (ref 78.0–100.0)
MONO ABS: 0.5 10*3/uL (ref 0.1–1.0)
Monocytes Relative: 8 % (ref 3–12)
Neutro Abs: 3.8 10*3/uL (ref 1.7–7.7)
Neutrophils Relative %: 65 % (ref 43–77)
Platelets: 141 10*3/uL — ABNORMAL LOW (ref 150–400)
RBC: 4.3 MIL/uL (ref 3.87–5.11)
RDW: 14.5 % (ref 11.5–15.5)
WBC: 5.8 10*3/uL (ref 4.0–10.5)

## 2014-04-29 LAB — HEMOGLOBIN A1C
HEMOGLOBIN A1C: 5.7 % — AB (ref <5.7)
Mean Plasma Glucose: 117 mg/dL — ABNORMAL HIGH (ref <117)

## 2014-04-29 NOTE — Patient Instructions (Signed)
Can try to stop the atenolol to see how the dizziness is.  Continue to drink plenty of fluids.   Preventative Care for Adults - Female      MAINTAIN REGULAR HEALTH EXAMS:  A routine yearly physical is a good way to check in with your primary care provider about your health and preventive screening. It is also an opportunity to share updates about your health and any concerns you have, and receive a thorough all-over exam.   Most health insurance companies pay for at least some preventative services.  Check with your health plan for specific coverages.  WHAT PREVENTATIVE SERVICES DO WOMEN NEED?  Adult women should have their weight and blood pressure checked regularly.   Women age 59 and older should have their cholesterol levels checked regularly.  Women should be screened for cervical cancer with a Pap smear and pelvic exam beginning at either age 69, or 3 years after they become sexually activity.    Breast cancer screening generally begins at age 22 with a mammogram and breast exam by your primary care provider.    Beginning at age 57 and continuing to age 70, women should be screened for colorectal cancer.  Certain people may need continued testing until age 48.  Updating vaccinations is part of preventative care.  Vaccinations help protect against diseases such as the flu.  Osteoporosis is a disease in which the bones lose minerals and strength as we age. Women ages 18 and over should discuss this with their caregivers, as should women after menopause who have other risk factors.  Lab tests are generally done as part of preventative care to screen for anemia and blood disorders, to screen for problems with the kidneys and liver, to screen for bladder problems, to check blood sugar, and to check your cholesterol level.  Preventative services generally include counseling about diet, exercise, avoiding tobacco, drugs, excessive alcohol consumption, and sexually transmitted infections.     GENERAL RECOMMENDATIONS FOR GOOD HEALTH:  Healthy diet:  Eat a variety of foods, including fruit, vegetables, animal or vegetable protein, such as meat, fish, chicken, and eggs, or beans, lentils, tofu, and grains, such as rice.  Drink plenty of water daily.  Decrease saturated fat in the diet, avoid lots of red meat, processed foods, sweets, fast foods, and fried foods.  Exercise:  Aerobic exercise helps maintain good heart health. At least 30-40 minutes of moderate-intensity exercise is recommended. For example, a brisk walk that increases your heart rate and breathing. This should be done on most days of the week.   Find a type of exercise or a variety of exercises that you enjoy so that it becomes a part of your daily life.  Examples are running, walking, swimming, water aerobics, and biking.  For motivation and support, explore group exercise such as aerobic class, spin class, Zumba, Yoga,or  martial arts, etc.    Set exercise goals for yourself, such as a certain weight goal, walk or run in a race such as a 5k walk/run.  Speak to your primary care provider about exercise goals.  Disease prevention:  If you smoke or chew tobacco, find out from your caregiver how to quit. It can literally save your life, no matter how long you have been a tobacco user. If you do not use tobacco, never begin.   Maintain a healthy diet and normal weight. Increased weight leads to problems with blood pressure and diabetes.   The Body Mass Index or BMI is a  way of measuring how much of your body is fat. Having a BMI above 27 increases the risk of heart disease, diabetes, hypertension, stroke and other problems related to obesity. Your caregiver can help determine your BMI and based on it develop an exercise and dietary program to help you achieve or maintain this important measurement at a healthful level.  High blood pressure causes heart and blood vessel problems.  Persistent high blood pressure  should be treated with medicine if weight loss and exercise do not work.   Fat and cholesterol leaves deposits in your arteries that can block them. This causes heart disease and vessel disease elsewhere in your body.  If your cholesterol is found to be high, or if you have heart disease or certain other medical conditions, then you may need to have your cholesterol monitored frequently and be treated with medication.   Ask if you should have a cardiac stress test if your history suggests this. A stress test is a test done on a treadmill that looks for heart disease. This test can find disease prior to there being a problem.  Menopause can be associated with physical symptoms and risks. Hormone replacement therapy is available to decrease these. You should talk to your caregiver about whether starting or continuing to take hormones is right for you.   Osteoporosis is a disease in which the bones lose minerals and strength as we age. This can result in serious bone fractures. Risk of osteoporosis can be identified using a bone density scan. Women ages 51 and over should discuss this with their caregivers, as should women after menopause who have other risk factors. Ask your caregiver whether you should be taking a calcium supplement and Vitamin D, to reduce the rate of osteoporosis.   Avoid drinking alcohol in excess (more than two drinks per day).  Avoid use of street drugs. Do not share needles with anyone. Ask for professional help if you need assistance or instructions on stopping the use of alcohol, cigarettes, and/or drugs.  Brush your teeth twice a day with fluoride toothpaste, and floss once a day. Good oral hygiene prevents tooth decay and gum disease. The problems can be painful, unattractive, and can cause other health problems. Visit your dentist for a routine oral and dental check up and preventive care every 6-12 months.   Look at your skin regularly.  Use a mirror to look at your back.  Notify your caregivers of changes in moles, especially if there are changes in shapes, colors, a size larger than a pencil eraser, an irregular border, or development of new moles.  Safety:  Use seatbelts 100% of the time, whether driving or as a passenger.  Use safety devices such as hearing protection if you work in environments with loud noise or significant background noise.  Use safety glasses when doing any work that could send debris in to the eyes.  Use a helmet if you ride a bike or motorcycle.  Use appropriate safety gear for contact sports.  Talk to your caregiver about gun safety.  Use sunscreen with a SPF (or skin protection factor) of 15 or greater.  Lighter skinned people are at a greater risk of skin cancer. Don't forget to also wear sunglasses in order to protect your eyes from too much damaging sunlight. Damaging sunlight can accelerate cataract formation.   Practice safe sex. Use condoms. Condoms are used for birth control and to help reduce the spread of sexually transmitted infections (or STIs).  Some of the STIs are gonorrhea (the clap), chlamydia, syphilis, trichomonas, herpes, HPV (human papilloma virus) and HIV (human immunodeficiency virus) which causes AIDS. The herpes, HIV and HPV are viral illnesses that have no cure. These can result in disability, cancer and death.   Keep carbon monoxide and smoke detectors in your home functioning at all times. Change the batteries every 6 months or use a model that plugs into the wall.   Vaccinations:  Stay up to date with your tetanus shots and other required immunizations. You should have a booster for tetanus every 10 years. Be sure to get your flu shot every year, since 5%-20% of the [place] population comes down with the flu. The flu vaccine changes each year, so being vaccinated once is not enough. Get your shot in the fall, before the flu season peaks.   Other vaccines to consider:  Human Papilloma Virus or HPV causes cancer of  the cervix, and other infections that can be transmitted from person to person. There is a vaccine for HPV, and females should get immunized between the ages of 11 and 41. It requires a series of 3 shots.   Pneumococcal vaccine to protect against certain types of pneumonia.  This is normally recommended for adults age 17 or older.  However, adults younger than 67 years old with certain underlying conditions such as diabetes, heart or lung disease should also receive the vaccine.  Shingles vaccine to protect against Varicella Zoster if you are older than age 60, or younger than 67 years old with certain underlying illness.  Hepatitis A vaccine to protect against a form of infection of the liver by a virus acquired from food.  Hepatitis B vaccine to protect against a form of infection of the liver by a virus acquired from blood or body fluids, particularly if you work in health care.  If you plan to travel internationally, check with your local health department for specific vaccination recommendations.  Cancer Screening:  Breast cancer screening is essential to preventive care for women. All women age 66 and older should perform a breast self-exam every month. At age 35 and older, women should have their caregiver complete a breast exam each year. Women at ages 1 and older should have a mammogram (x-ray film) of the breasts. Your caregiver can discuss how often you need mammograms.    Cervical cancer screening includes taking a Pap smear (sample of cells examined under a microscope) from the cervix (end of the uterus). It also includes testing for HPV (Human Papilloma Virus, which can cause cervical cancer). Screening and a pelvic exam should begin at age 82, or 3 years after a woman becomes sexually active. Screening should occur every year, with a Pap smear but no HPV testing, up to age 2. After age 41, you should have a Pap smear every 3 years with HPV testing, if no HPV was found previously.    Most routine colon cancer screening begins at the age of 21. On a yearly basis, doctors may provide special easy to use take-home tests to check for hidden blood in the stool. Sigmoidoscopy or colonoscopy can detect the earliest forms of colon cancer and is life saving. These tests use a small camera at the end of a tube to directly examine the colon. Speak to your caregiver about this at age 49, when routine screening begins (and is repeated every 5 years unless early forms of pre-cancerous polyps or small growths are found).

## 2014-04-29 NOTE — Progress Notes (Signed)
MEDICARE ANNUAL WELLNESS VISIT AND FOLLOW UP  Assessment:   1. HYPERTENSION - CBC with Differential - BASIC METABOLIC PANEL WITH GFR - Hepatic function panel  2. Gastroesophageal reflux disease without esophagitis controlled  3. ARTHRITIS, RHEUMATOID Continue medications  4. Encounter for long-term (current) use of other medications - Magnesium  5. Hyperlipidemia - Lipid panel  6. Other abnormal glucose Discussed general issues about diabetes pathophysiology and management., Educational material distributed., Suggested low cholesterol diet., Encouraged aerobic exercise., Discussed foot care., Reminded to get yearly retinal exam. - Hemoglobin A1c - Insulin, fasting  7. Vitamin D Deficiency - Vit D  25 hydroxy (rtn osteoporosis monitoring)  8. Unspecified hypothyroidism - TSH   Plan:   During the course of the visit the patient was educated and counseled about appropriate screening and preventive services including:    Pneumococcal vaccine   Influenza vaccine  Td vaccine  Screening electrocardiogram  Screening mammography  Bone densitometry screening  Colorectal cancer screening  Diabetes screening  Glaucoma screening  Nutrition counseling   Advanced directives: given info/requested  Screening recommendations, referrals:  Vaccinations: Tdap vaccine not indicated Influenza vaccine not indicated Pneumococcal vaccine not indicated Shingles vaccine declined due to cost Hep B vaccine not indicated  Nutrition assessed and recommended  Colonoscopy up to date Mammogram up to date Pap smear not indicated Pelvic exam not indicated Recommended yearly ophthalmology/optometry visit for glaucoma screening and checkup Recommended yearly dental visit for hygiene and checkup Advanced directives - requested  Conditions/risks identified: BMI: Discussed weight loss, diet, and increase physical activity.  Increase physical activity: AHA recommends 150 minutes  of physical activity a week.  Medications reviewed DEXA- due 2016- started fosamax 04/2013 Diabetes is at goal, ACE/ARB therapy: Yes. RA- on DMARD Urinary Incontinence is not an issue: discussed non pharmacology and pharmacology options.  Fall risk: low- discussed PT, home fall assessment, medications.    Subjective:   [name] is a 68 y.o. female who presents for Medicare Annual Wellness Visit and 3 month follow up on hypertension, prediabetes, hyperlipidemia, vitamin D def.  Date of last medicare wellness visit is unknown.   Her blood pressure has been controlled at home, today their BP is BP: 120/62 mmHg She does not workout, other than the physical therapy that she is doing for her right shoulder. She denies chest pain, shortness of breath, dizziness.  She is on cholesterol medication and denies myalgias. Her cholesterol is at goal. The cholesterol last visit was:   Lab Results  Component Value Date   CHOL 166 01/22/2014   HDL 67 01/22/2014   LDLCALC 81 01/22/2014   TRIG 92 01/22/2014   CHOLHDL 2.5 01/22/2014   She has been working on diet and exercise for prediabetes, and denies increased appetite, nausea, paresthesia of the feet, polydipsia and polyuria. Last A1C in the office was:  Lab Results  Component Value Date   HGBA1C 5.8* 01/22/2014   Patient is on Vitamin D supplement. She is on thyroid medication. Her medication was changed last visit, she is on a 1.5 pills 2 x a week and 1 pill the rest. Patient denies nervousness, palpitations and weight changes.  Lab Results  Component Value Date   TSH 0.084* 02/20/2014  .  She had a total right shoulder replacement at [facility] with Dr. Enrigue Catena on 02/22/2014, she is still doing PT, the 8th week and states she thinks she is doing well. She has a follow up 05/20/2014. She was having dizziness/syncope but her HCTZ was stopped  and she is doing better. Still occ dizziness.  She sees Dr. Trudie Reed regularly and states that her RA is  well controlled on plaquenil and Methotrexate.   Names of Other Physician/Practitioners you currently use: 1. [facility]- here for primary care 2. Dr. Gershon Crane, eye doctor, last visit q 6 months Patient Care Team: [name], MD as PCP - General (Internal Medicine) Stark Klein, MD as Consulting Physician (General Surgery) Campbell Lerner, MD as Consulting Physician (Rheumatology) Lelon Perla, MD as Consulting Physician (Cardiology) Inda Castle, MD as Consulting Physician (Gastroenterology) Starr Lake, MD as Referring Physician (Orthopedic Surgery)  Medication Review Current Outpatient Prescriptions on File Prior to Visit  Medication Sig Dispense Refill  . alendronate (FOSAMAX) 70 MG tablet Take 1 tablet (70 mg total) by mouth once a week.  12 tablet  3  . ALPRAZolam (XANAX) 1 MG tablet Take 1 mg by mouth daily.        Marland Kitchen atenolol (TENORMIN) 25 MG tablet Take 1 tablet (25 mg total) by mouth daily. For BP  90 tablet  99  . Cholecalciferol (VITAMIN D PO) Take 5,000 Units by mouth daily.       . folic acid (FOLVITE) 1 MG tablet Take 2 mg by mouth daily.       . hydroxychloroquine (PLAQUENIL) 200 MG tablet Take by mouth daily. Take 1 tab AM and PM      . levothyroxine (SYNTHROID, LEVOTHROID) 150 MCG tablet Take 1 tablet (150 mcg total) by mouth daily.  90 tablet  99  . losartan (COZAAR) 50 MG tablet Take 1 tablet daily for BP  90 tablet  99  . Methotrexate Sodium (METHOTREXATE PO) Take 2.5 mg by mouth. 10 pills weekly      . pravastatin (PRAVACHOL) 10 MG tablet Take 1 tablet (10 mg total) by mouth daily. For Cholesterol  90 tablet  3   No current facility-administered medications on file prior to visit.    Current Problems (verified) Patient Active Problem List   Diagnosis Date Noted  . Vitamin D Deficiency 01/22/2014  . Other abnormal glucose 01/22/2014  . Encounter for long-term (current) use of other medications 01/22/2014   . Syncope 12/10/2013  . Personal history of colonic polyps 04/18/2013  . GERD (gastroesophageal reflux disease) 02/28/2013  . Hyperlipidemia 10/07/2011  . HYPERTENSION 10/21/2008  . ARTHRITIS, RHEUMATOID 10/21/2008    Screening Tests Health Maintenance  Topic Date Due  . Zostavax  05/17/2007  . Influenza Vaccine  06/08/2014  . Mammogram  01/08/2016  . Colonoscopy  03/08/2018  . Tetanus/tdap  11/08/2022  . Pneumococcal Polysaccharide Vaccine Age 31 And Over  Completed     Immunization History  Administered Date(s) Administered  . Pneumococcal Polysaccharide-23 11/08/2012  . Td 11/08/2012    Preventative care: Last colonoscopy: 03/2013 Last mammogram: 01/2014 Last pap smear/pelvic exam: remote   DEXA:2014 + osteoporosis put on Fosamax MRI head 2012 normal Echo 2012 normal US RENAL normal 2013  Prior vaccinations: TD or Tdap: 2014  Influenza: 2014 Pneumococcal: 2014 Shingles/Zostavax: will check price  History reviewed: allergies, current medications, past family history, past medical history, past social history, past surgical history and problem list  Risk Factors: Osteoporosis: postmenopausal estrogen deficiency and dietary calcium and/or vitamin D deficiency History of fracture in the past year: no  Tobacco History  Substance Use Topics  . Smoking status: Former Smoker    Quit date: 10/25/1995  . Smokeless tobacco: Never Used  Comment: Maryann Conners [id number]  . Alcohol Use: Yes     Comment: occasional   She does not smoke.  Patient is a former smoker. Are there smokers in your home (other than you)?  No  Alcohol Current alcohol use: social drinker  Caffeine Current caffeine use: coffee 1 /day  Exercise Current exercise: none  Nutrition/Diet Current diet: in general, a "healthy" diet    Cardiac risk factors: advanced age (older than 72 for men, 9 for women), dyslipidemia, hypertension, obesity (BMI >= 30 kg/m2) and sedentary lifestyle.  Depression  Screen (Note: if answer to either of the following is "Yes", a more complete depression screening is indicated)   Q1: Over the past two weeks, have you felt down, depressed or hopeless? No  Q2: Over the past two weeks, have you felt little interest or pleasure in doing things? No  Have you lost interest or pleasure in daily life? No  Do you often feel hopeless? No  Do you cry easily over simple problems? No  Activities of Daily Living In your present state of health, do you have any difficulty performing the following activities?:  Driving? No Managing money?  No Feeding yourself? No Getting from bed to chair? No Climbing a flight of stairs? No Preparing food and eating?: No Bathing or showering? No Getting dressed: No Getting to the toilet? No Using the toilet:No Moving around from place to place: No In the past year have you fallen or had a near fall?:No   Are you sexually active?  No  Do you have more than one partner?  No  Vision Difficulties: No  Hearing Difficulties: No Do you often ask people to speak up or repeat themselves? No Do you experience ringing or noises in your ears? No Do you have difficulty understanding soft or whispered voices? No  Cognition  Do you feel that you have a problem with memory?No  Do you often misplace items? No  Do you feel safe at home?  Yes  Advanced directives Does patient have a [place]? Yes Does patient have a Living Will? Yes   Objective:   Blood pressure 120/62, pulse 56, temperature 98.1 F (36.7 C), resp. rate 16, height 5\' 1"  (1.549 m), weight 167 lb (75.751 kg). Body mass index is 31.57 kg/(m^2).  General appearance: alert, no distress, WD/WN,  female Cognitive Testing  Alert? Yes  Normal Appearance?Yes  Oriented to person? No  Place? Yes   Time? Yes  Recall of three objects?  Yes  Can perform simple calculations? Yes  Displays appropriate judgment?Yes  Can read the correct time from a watch  face?Yes  HEENT: normocephalic, sclerae anicteric, TMs pearly, nares patent, no discharge or erythema, pharynx normal Oral cavity: MMM, no lesions Neck: supple, no lymphadenopathy, no thyromegaly, no masses Heart: RRR, normal S1, S2, no murmurs Lungs: CTA bilaterally, no wheezes, rhonchi, or rales Abdomen: +bs, soft, non tender, non distended, no masses, no hepatomegaly, no splenomegaly Musculoskeletal: nontender, no swelling, no obvious deformity Extremities: no edema, no cyanosis, no clubbing Pulses: 2+ symmetric, upper and lower extremities, normal cap refill Neurological: alert, oriented x 3, CN2-12 intact, strength normal upper extremities and lower extremities, sensation normal throughout, DTRs 2+ throughout, no cerebellar signs, gait normal Psychiatric: normal affect, behavior normal, pleasant  Breast: defer Gyn: defer Rectal: defer  Medicare Attestation I have personally reviewed: The patient's medical and social history Their use of alcohol, tobacco or illicit drugs Their current medications and supplements The patient's functional  ability including ADLs,fall risks, home safety risks, cognitive, and hearing and visual impairment Diet and physical activities Evidence for depression or mood disorders  The patient's weight, height, BMI, and visual acuity have been recorded in the chart.  I have made referrals, counseling, and provided education to the patient based on review of the above and I have provided the patient with a written personalized care plan for preventive services.     Vicie Mutters, PA-C   04/29/2014

## 2014-04-30 LAB — LIPID PANEL
CHOL/HDL RATIO: 2.5 ratio
Cholesterol: 153 mg/dL (ref 0–200)
HDL: 62 mg/dL (ref >39)
LDL CALC: 79 mg/dL (ref 0–99)
Triglycerides: 58 mg/dL (ref <150)
VLDL: 12 mg/dL (ref 0–40)

## 2014-04-30 LAB — VITAMIN D 25 HYDROXY (VIT D DEFICIENCY, FRACTURES): Vit D, 25-Hydroxy: 85 ng/mL (ref 30–89)

## 2014-04-30 LAB — HEPATIC FUNCTION PANEL
ALBUMIN: 3.9 g/dL (ref 3.5–5.2)
ALK PHOS: 97 U/L (ref 39–117)
ALT: 17 U/L (ref 0–35)
AST: 27 U/L (ref 0–37)
BILIRUBIN INDIRECT: 0.5 mg/dL (ref 0.2–1.2)
BILIRUBIN TOTAL: 0.6 mg/dL (ref 0.2–1.2)
Bilirubin, Direct: 0.1 mg/dL (ref 0.0–0.3)
Total Protein: 5.9 g/dL — ABNORMAL LOW (ref 6.0–8.3)

## 2014-04-30 LAB — BASIC METABOLIC PANEL WITH GFR
BUN: 8 mg/dL (ref 6–23)
CHLORIDE: 104 meq/L (ref 96–112)
CO2: 28 mEq/L (ref 19–32)
CREATININE: 0.87 mg/dL (ref 0.50–1.10)
Calcium: 10.1 mg/dL (ref 8.4–10.5)
GFR, Est African American: 80 mL/min
GFR, Est Non African American: 70 mL/min
Glucose, Bld: 85 mg/dL (ref 70–99)
Potassium: 4.5 mEq/L (ref 3.5–5.3)
Sodium: 140 mEq/L (ref 135–145)

## 2014-04-30 LAB — INSULIN, FASTING: Insulin fasting, serum: 16 u[IU]/mL (ref 3–28)

## 2014-04-30 LAB — TSH: TSH: 0.019 u[IU]/mL — AB (ref 0.350–4.500)

## 2014-04-30 LAB — MAGNESIUM: Magnesium: 1.8 mg/dL (ref 1.5–2.5)

## 2014-05-01 ENCOUNTER — Ambulatory Visit: Payer: Medicare HMO | Admitting: Physical Therapy

## 2014-05-06 ENCOUNTER — Ambulatory Visit: Payer: Medicare HMO | Admitting: Physical Therapy

## 2014-05-09 ENCOUNTER — Ambulatory Visit: Payer: Medicare HMO | Attending: Rehabilitation | Admitting: Physical Therapy

## 2014-05-09 DIAGNOSIS — R609 Edema, unspecified: Secondary | ICD-10-CM | POA: Insufficient documentation

## 2014-05-09 DIAGNOSIS — M25519 Pain in unspecified shoulder: Secondary | ICD-10-CM | POA: Diagnosis not present

## 2014-05-09 DIAGNOSIS — M25619 Stiffness of unspecified shoulder, not elsewhere classified: Secondary | ICD-10-CM | POA: Diagnosis not present

## 2014-05-09 DIAGNOSIS — Z5189 Encounter for other specified aftercare: Secondary | ICD-10-CM | POA: Diagnosis not present

## 2014-05-09 DIAGNOSIS — R293 Abnormal posture: Secondary | ICD-10-CM | POA: Diagnosis not present

## 2014-05-09 DIAGNOSIS — R5381 Other malaise: Secondary | ICD-10-CM | POA: Insufficient documentation

## 2014-05-14 ENCOUNTER — Ambulatory Visit: Payer: Medicare HMO | Admitting: Physical Therapy

## 2014-05-14 DIAGNOSIS — Z5189 Encounter for other specified aftercare: Secondary | ICD-10-CM | POA: Diagnosis not present

## 2014-05-20 ENCOUNTER — Encounter: Payer: Commercial Managed Care - HMO | Admitting: Physical Therapy

## 2014-05-23 ENCOUNTER — Encounter: Payer: Commercial Managed Care - HMO | Admitting: Physical Therapy

## 2014-05-31 ENCOUNTER — Ambulatory Visit (INDEPENDENT_AMBULATORY_CARE_PROVIDER_SITE_OTHER): Payer: Commercial Managed Care - HMO

## 2014-05-31 ENCOUNTER — Other Ambulatory Visit: Payer: Self-pay

## 2014-05-31 DIAGNOSIS — R7989 Other specified abnormal findings of blood chemistry: Secondary | ICD-10-CM

## 2014-05-31 DIAGNOSIS — I10 Essential (primary) hypertension: Secondary | ICD-10-CM

## 2014-05-31 LAB — TSH: TSH: 0.223 u[IU]/mL — ABNORMAL LOW (ref 0.350–4.500)

## 2014-05-31 MED ORDER — LOSARTAN POTASSIUM 50 MG PO TABS: ORAL_TABLET | ORAL | Status: DC

## 2014-05-31 NOTE — Progress Notes (Signed)
Patient ID: [name], female   DOB: May 13, 1946, 68 y.o.   MRN: [id number] Patient here today to recheck TSH, currently takine Levothyroxine 150 mcg one daily except not taking a pill on Friday only.

## 2014-07-02 ENCOUNTER — Ambulatory Visit (INDEPENDENT_AMBULATORY_CARE_PROVIDER_SITE_OTHER): Payer: Commercial Managed Care - HMO | Admitting: *Deleted

## 2014-07-02 DIAGNOSIS — E039 Hypothyroidism, unspecified: Secondary | ICD-10-CM

## 2014-07-02 NOTE — Progress Notes (Signed)
Patient ID: [name], female   DOB: 06-02-1946, 67 y.o.   MRN: [id number] Patient presents for recheck TSH.  Patient states she is taking 150 mcg times 4 days and 1/2 pill 75 mcg times 3 days.

## 2014-07-03 LAB — TSH: TSH: 0.222 u[IU]/mL — ABNORMAL LOW (ref 0.350–4.500)

## 2014-07-16 ENCOUNTER — Ambulatory Visit (INDEPENDENT_AMBULATORY_CARE_PROVIDER_SITE_OTHER): Payer: Commercial Managed Care - HMO | Admitting: *Deleted

## 2014-07-16 DIAGNOSIS — Z23 Encounter for immunization: Secondary | ICD-10-CM

## 2014-07-31 ENCOUNTER — Encounter: Payer: Self-pay | Admitting: Internal Medicine

## 2014-07-31 ENCOUNTER — Ambulatory Visit (INDEPENDENT_AMBULATORY_CARE_PROVIDER_SITE_OTHER): Payer: Commercial Managed Care - HMO | Admitting: Internal Medicine

## 2014-07-31 VITALS — BP 116/80 | HR 76 | Temp 98.4°F | Resp 16 | Ht 61.5 in | Wt 170.6 lb

## 2014-07-31 DIAGNOSIS — E559 Vitamin D deficiency, unspecified: Secondary | ICD-10-CM

## 2014-07-31 DIAGNOSIS — I10 Essential (primary) hypertension: Secondary | ICD-10-CM

## 2014-07-31 DIAGNOSIS — R7309 Other abnormal glucose: Secondary | ICD-10-CM

## 2014-07-31 DIAGNOSIS — E785 Hyperlipidemia, unspecified: Secondary | ICD-10-CM

## 2014-07-31 DIAGNOSIS — Z79899 Other long term (current) drug therapy: Secondary | ICD-10-CM

## 2014-07-31 NOTE — Progress Notes (Signed)
Patient ID: [name], female   DOB: 1945/12/15, 68 y.o.   MRN: [id number]   This very nice 68 y.o.MWF presents for 3 month follow up with Hypertension, Hyperlipidemia, Pre-Diabetes and Vitamin D Deficiency. Also, patient is followed by Dr Gavin Pound for Rheumatoid Arthritis.   Patient is treated for HTN & BP has been controlled at home. Today's BP was 116/80 mmHg. Patient denies any cardiac type chest pain, palpitations, dyspnea/orthopnea/PND, dizziness, claudication, or dependent edema.  Hyperlipidemia has been controlled with diet & meds. Patient denies myalgias or other med SE's. Last Lipids were at goal - Total Chol 153; HDL 62; LDL  79; Trig 58 on 04/29/2014. Patient relates being off of her Pravastatin x 3 months due to severe myalgias which resolved with stopping the medication.   Also, the patient has history of PreDiabetes and patient denies any symptoms of reactive hypoglycemia, diabetic polys, paresthesias or visual blurring.  Last A1c was 5.7% on 04/29/2014.    Further, Patient has history of Vitamin D Deficiency and patient supplements vitamin D without any suspected side-effects. Last vitamin D was  85 on 04/29/2014.   Medication List   alendronate 70 MG tablet  Commonly known as:  FOSAMAX  Take 1 tablet (70 mg total) by mouth once a week.     ALPRAZolam 1 MG tablet  Commonly known as:  XANAX  Take 1 mg by mouth daily.     folic acid 1 MG tablet  Commonly known as:  FOLVITE  Take 2 mg by mouth daily.     hydroxychloroquine 200 MG tablet  Commonly known as:  PLAQUENIL  Take by mouth daily. Take 1 tab AM and PM     levothyroxine 150 MCG tablet  Commonly known as:  SYNTHROID, LEVOTHROID  Take 1 tablet (150 mcg total) by mouth daily.     losartan 50 MG tablet  Commonly known as:  COZAAR  Take 1 tablet daily for BP     METHOTREXATE PO  Take 2.5 mg by mouth. 10 pills weekly     omeprazole 40 MG capsule  Commonly known as:  PRILOSEC  Take one capsule daily     pravastatin 10 MG tablet  --- OFF X 3 MONTHS  Commonly known as:  PRAVACHOL  Take 1 tablet (10 mg total) by mouth daily. For Cholesterol     VITAMIN D PO  Take 5,000 Units by mouth daily.     Allergies  Allergen Reactions  . Ace Inhibitors Cough  . Ambien [Zolpidem] Other (See Comments)    Odd Feeling  . Crestor [Rosuvastatin]   . Pravastatin   . Prozac [Fluoxetine Hcl] Other (See Comments)    Decreased libido  . Zoloft [Sertraline Hcl]    PMHx:   Past Medical History  Diagnosis Date  . Hyperlipidemia   . Hypothyroid   . Anemia     Hx of   . H. pylori infection     Hx of   . GERD (gastroesophageal reflux disease)   . PUD (peptic ulcer disease)   . Calculus of bile duct without mention of cholecystitis or obstruction   . Arthritis   . Rheumatoid arthritis(714.0)   . Hypertension   . Cataract   . Heart murmur    FHx:    Reviewed / unchanged SHx:    Reviewed / unchanged  Systems Review:  Constitutional: Denies fever, chills, wt changes, headaches, insomnia, fatigue, night sweats, change in appetite. Eyes: Denies redness, blurred vision, diplopia, discharge, itchy,  watery eyes.  ENT: Denies discharge, congestion, post nasal drip, epistaxis, sore throat, earache, hearing loss, dental pain, tinnitus, vertigo, sinus pain, snoring.  CV: Denies chest pain, palpitations, irregular heartbeat, syncope, dyspnea, diaphoresis, orthopnea, PND, claudication or edema. Respiratory: denies cough, dyspnea, DOE, pleurisy, hoarseness, laryngitis, wheezing.  Gastrointestinal: Denies dysphagia, odynophagia, heartburn, reflux, water brash, abdominal pain or cramps, nausea, vomiting, bloating, diarrhea, constipation, hematemesis, melena, hematochezia  or hemorrhoids. Genitourinary: Denies dysuria, frequency, urgency, nocturia, hesitancy, discharge, hematuria or flank pain. Musculoskeletal: Denies arthralgias, myalgias, stiffness, jt. swelling, pain, limping or strain/sprain.  Skin: Denies  pruritus, rash, hives, warts, acne, eczema or change in skin lesion(s). Neuro: No weakness, tremor, incoordination, spasms, paresthesia or pain. Psychiatric: Denies confusion, memory loss or sensory loss. Endo: Denies change in weight, skin or hair change.  Heme/Lymph: No excessive bleeding, bruising or enlarged lymph nodes.  Exam:  BP 116/80  Pulse 76  Temp(Src) 98.4 F (36.9 C) (Temporal)  Resp 16  Ht 5' 1.5" (1.562 m)  Wt 170 lb 9.6 oz (77.384 kg)  BMI 31.72 kg/m2  Appears well nourished and in no distress. Eyes: PERRLA, EOMs, conjunctiva no swelling or erythema. Sinuses: No frontal/maxillary tenderness ENT/Mouth: EAC's clear, TM's nl w/o erythema, bulging. Nares clear w/o erythema, swelling, exudates. Oropharynx clear without erythema or exudates. Oral hygiene is good. Tongue normal, non obstructing. Hearing intact.  Neck: Supple. Thyroid nl. Car 2+/2+ without bruits, nodes or JVD. Chest: Respirations nl with BS clear & equal w/o rales, rhonchi, wheezing or stridor.  Cor: Heart sounds normal w/ regular rate and rhythm without sig. murmurs, gallops, clicks, or rubs. Peripheral pulses normal and equal  without edema.  Abdomen: Soft & bowel sounds normal. Non-tender w/o guarding, rebound, hernias, masses, or organomegaly.  Lymphatics: Unremarkable.  Musculoskeletal: Full ROM all peripheral extremities, joint stability, 5/5 strength, and normal gait.  Skin: Warm, dry without exposed rashes, lesions or ecchymosis apparent.  Neuro: Cranial nerves intact, reflexes equal bilaterally. Sensory-motor testing grossly intact. Tendon reflexes grossly intact.  Pysch: Alert & oriented x 3.  Insight and judgement nl & appropriate. No ideations.  Assessment and Plan:  1. Hypertension - Continue monitor blood pressure at home. Continue diet/meds same.  2. Hyperlipidemia - Continue diet, exercise,& lifestyle modifications. Continue monitor periodic cholesterol/liver & renal functions   3.  Pre-Diabetes - Continue diet, exercise, lifestyle modifications. Monitor appropriate labs.  4. Vitamin D Deficiency - Continue supplementation.  5. Rheumatoid Arthritis - f/u w/ Dr Trudie Reed  Recommended regular exercise, BP monitoring, weight control, and discussed med and SE's. Recommended labs to assess and monitor clinical status. Further disposition pending results of labs.

## 2014-07-31 NOTE — Patient Instructions (Signed)

## 2014-08-01 ENCOUNTER — Other Ambulatory Visit: Payer: Self-pay | Admitting: *Deleted

## 2014-08-01 DIAGNOSIS — E039 Hypothyroidism, unspecified: Secondary | ICD-10-CM

## 2014-08-01 LAB — CBC WITH DIFFERENTIAL/PLATELET
Basophils Absolute: 0 10*3/uL (ref 0.0–0.1)
Basophils Relative: 1 % (ref 0–1)
EOS ABS: 0.2 10*3/uL (ref 0.0–0.7)
EOS PCT: 5 % (ref 0–5)
HEMATOCRIT: 35.2 % — AB (ref 36.0–46.0)
Hemoglobin: 11.6 g/dL — ABNORMAL LOW (ref 12.0–15.0)
Lymphocytes Relative: 24 % (ref 12–46)
Lymphs Abs: 1.1 10*3/uL (ref 0.7–4.0)
MCH: 28.9 pg (ref 26.0–34.0)
MCHC: 33 g/dL (ref 30.0–36.0)
MCV: 87.6 fL (ref 78.0–100.0)
Monocytes Absolute: 0.4 10*3/uL (ref 0.1–1.0)
Monocytes Relative: 8 % (ref 3–12)
Neutro Abs: 2.7 10*3/uL (ref 1.7–7.7)
Neutrophils Relative %: 62 % (ref 43–77)
Platelets: 164 10*3/uL (ref 150–400)
RBC: 4.02 MIL/uL (ref 3.87–5.11)
RDW: 16.5 % — ABNORMAL HIGH (ref 11.5–15.5)
WBC: 4.4 10*3/uL (ref 4.0–10.5)

## 2014-08-01 LAB — HEPATIC FUNCTION PANEL
ALK PHOS: 102 U/L (ref 39–117)
ALT: 32 U/L (ref 0–35)
AST: 48 U/L — AB (ref 0–37)
Albumin: 3.9 g/dL (ref 3.5–5.2)
BILIRUBIN DIRECT: 0.1 mg/dL (ref 0.0–0.3)
Indirect Bilirubin: 0.5 mg/dL (ref 0.2–1.2)
Total Bilirubin: 0.6 mg/dL (ref 0.2–1.2)
Total Protein: 6.2 g/dL (ref 6.0–8.3)

## 2014-08-01 LAB — LIPID PANEL
CHOL/HDL RATIO: 2.3 ratio
Cholesterol: 189 mg/dL (ref 0–200)
HDL: 82 mg/dL (ref >39)
LDL CALC: 94 mg/dL (ref 0–99)
Triglycerides: 65 mg/dL (ref <150)
VLDL: 13 mg/dL (ref 0–40)

## 2014-08-01 LAB — BASIC METABOLIC PANEL WITH GFR
BUN: 5 mg/dL — ABNORMAL LOW (ref 6–23)
CALCIUM: 9.8 mg/dL (ref 8.4–10.5)
CHLORIDE: 101 meq/L (ref 96–112)
CO2: 30 meq/L (ref 19–32)
Creat: 0.97 mg/dL (ref 0.50–1.10)
GFR, Est African American: 70 mL/min
GFR, Est Non African American: 61 mL/min
Glucose, Bld: 89 mg/dL (ref 70–99)
Potassium: 4.3 mEq/L (ref 3.5–5.3)
Sodium: 137 mEq/L (ref 135–145)

## 2014-08-01 LAB — HEMOGLOBIN A1C
HEMOGLOBIN A1C: 5.2 % (ref <5.7)
Mean Plasma Glucose: 103 mg/dL (ref <117)

## 2014-08-01 LAB — VITAMIN D 25 HYDROXY (VIT D DEFICIENCY, FRACTURES): Vit D, 25-Hydroxy: 89 ng/mL (ref 30–89)

## 2014-08-01 LAB — MAGNESIUM: Magnesium: 1.7 mg/dL (ref 1.5–2.5)

## 2014-08-01 LAB — TSH: TSH: 19.451 u[IU]/mL — AB (ref 0.350–4.500)

## 2014-08-01 LAB — INSULIN, FASTING: Insulin fasting, serum: 8 u[IU]/mL (ref 2.0–19.6)

## 2014-08-01 MED ORDER — LEVOTHYROXINE SODIUM 150 MCG PO TABS: 150.0000 ug | ORAL_TABLET | Freq: Every day | ORAL | Status: DC

## 2014-08-05 ENCOUNTER — Ambulatory Visit (INDEPENDENT_AMBULATORY_CARE_PROVIDER_SITE_OTHER): Payer: Commercial Managed Care - HMO | Admitting: Cardiology

## 2014-08-05 ENCOUNTER — Encounter: Payer: Self-pay | Admitting: Cardiology

## 2014-08-05 VITALS — BP 140/80 | HR 54 | Ht 62.0 in | Wt 166.1 lb

## 2014-08-05 DIAGNOSIS — R55 Syncope and collapse: Secondary | ICD-10-CM

## 2014-08-05 DIAGNOSIS — E785 Hyperlipidemia, unspecified: Secondary | ICD-10-CM

## 2014-08-05 DIAGNOSIS — I10 Essential (primary) hypertension: Secondary | ICD-10-CM

## 2014-08-05 NOTE — Patient Instructions (Signed)
Your physician recommends that you schedule a follow-up appointment in: AS NEEDED  

## 2014-08-05 NOTE — Assessment & Plan Note (Addendum)
Management per primary care. 

## 2014-08-05 NOTE — Progress Notes (Signed)
HPI: FU syncope. Echocardiogram in December of 2012 showed normal LV function, grade 1 diastolic dysfunction and mild mitral regurgitation. Renal Dopplers in November of 2013 showed no renal artery stenosis. Holter monitor in December of 2014 showed sinus rhythm with occasional PACs. Laboratories in December of 2014 showed normal hemoglobin, potassium and TSH. Over the past several years she has had dizzy spells and occasional syncope. Each episode occurs after standing up from the lying or sitting position. HCTZ DCed and I asked her to increase fluid/Na intake. Symptoms improved; since I last saw her, the patient denies any dyspnea on exertion, orthopnea, PND, pedal edema, palpitations, syncope or chest pain. Occasional mild dizziness with changing positions much improved.    Current Outpatient Prescriptions  Medication Sig Dispense Refill  . alendronate (FOSAMAX) 70 MG tablet Take 1 tablet (70 mg total) by mouth once a week.  12 tablet  3  . ALPRAZolam (XANAX) 1 MG tablet Take 1 mg by mouth daily.        . Cholecalciferol (VITAMIN D PO) Take 5,000 Units by mouth daily.       . folic acid (FOLVITE) 1 MG tablet Take 2 mg by mouth daily.       . hydroxychloroquine (PLAQUENIL) 200 MG tablet Take by mouth daily. Take 1 tab AM and PM      . levothyroxine (SYNTHROID, LEVOTHROID) 150 MCG tablet Take 1 tablet (150 mcg total) by mouth daily.  90 tablet  4  . losartan (COZAAR) 50 MG tablet Take 1 tablet daily for BP  90 tablet  prn  . Methotrexate Sodium (METHOTREXATE PO) Take 2.5 mg by mouth. 10 pills weekly      . omeprazole (PRILOSEC) 40 MG capsule Take one capsule daily       No current facility-administered medications for this visit.     Past Medical History  Diagnosis Date  . Hyperlipidemia   . Hypothyroid   . Anemia     Hx of   . H. pylori infection     Hx of   . GERD (gastroesophageal reflux disease)   . PUD (peptic ulcer disease)   . Calculus of bile duct without mention of  cholecystitis or obstruction   . Arthritis   . Rheumatoid arthritis(714.0)   . Hypertension   . Cataract   . Heart murmur     Past Surgical History  Procedure Laterality Date  . Vaginal hysterectomy      ovaries not removed  . Carpal tunnel release    . Cystectomy      left breast  . Av fistula repair    . Bladder suspension    . Pilonidal cyst excision    . Trigger finger release    . Bunionectomy    . Ercp  09/29/2011    Procedure: ENDOSCOPIC RETROGRADE CHOLANGIOPANCREATOGRAPHY (ERCP);  Surgeon: Inda Castle, MD;  Location: [facility];  Service: Endoscopy;  Laterality: N/A;  . Tonsillectomy    . Cholecystectomy  09/07/2011  . Tenolysis  10/26/2011    Procedure: TENDON SHEATH RELEASE/TENOLYSIS;  Surgeon: Wynonia Sours, MD;  Location: [facility];  Service: Orthopedics;  Laterality: Right;  tenosynovectomy removal superficialis slip right index finger  . 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 Enrigue Catena at [place]  . Marital Status: Married    Spouse Name: N/A    Number of Children: 2  . Years of Education: N/A  Occupational History  . retired     Retired   Social History Main Topics  . Smoking status: Former Smoker    Quit date: 10/25/1995  . Smokeless tobacco: Never Used     Comment: Maryann Conners [id number]  . Alcohol Use: Yes     Comment: occasional  . Drug Use: No  . Sexual Activity: Yes    Birth Control/ Protection: Post-menopausal   Other Topics Concern  . Not on file   Social History Narrative  . No narrative on file    ROS: no fevers or chills, productive cough, hemoptysis, dysphasia, odynophagia, melena, hematochezia, dysuria, hematuria, rash, seizure activity, orthopnea, PND, pedal edema, claudication. Remaining systems are negative.  Physical Exam: Well-developed well-nourished in no acute distress.  Skin is warm and dry.  HEENT is normal.  Neck is supple.  Chest is clear to auscultation with  normal expansion.  Cardiovascular exam is regular rate and rhythm.  Abdominal exam nontender or distended. No masses palpated. Extremities show no edema. neuro grossly intact  ECG Sinus bradycardia at a rate of 54. Inferior lateral T-wave inversion.

## 2014-08-05 NOTE — Assessment & Plan Note (Signed)
Pressure controlled. Continue present medications.

## 2014-08-05 NOTE — Assessment & Plan Note (Addendum)
Symptoms are much improved. No recurrent episodes. Continue off of diuretics. We discussed the importance of maintaining by mouth fluid intake and liberalized sodium intake. She will contact us in the future with any recurrent episodes.

## 2014-09-02 ENCOUNTER — Ambulatory Visit (INDEPENDENT_AMBULATORY_CARE_PROVIDER_SITE_OTHER): Payer: Commercial Managed Care - HMO | Admitting: *Deleted

## 2014-09-02 DIAGNOSIS — R946 Abnormal results of thyroid function studies: Secondary | ICD-10-CM

## 2014-09-02 DIAGNOSIS — R7989 Other specified abnormal findings of blood chemistry: Secondary | ICD-10-CM

## 2014-09-02 DIAGNOSIS — E039 Hypothyroidism, unspecified: Secondary | ICD-10-CM

## 2014-09-02 LAB — TSH: TSH: 0.065 u[IU]/mL — ABNORMAL LOW (ref 0.350–4.500)

## 2014-09-02 NOTE — Progress Notes (Signed)
Patient ID: [name], female   DOB: May 24, 1946, 68 y.o.   MRN: [id number] Patient presents for recheck TSH.  Patient very concerned about TSH levels being abnormal for the past year without being able to manage through multiple Rx changes.  Patient requesting to see Endocrine specialists for further evaluation.  Patient states she currently is taking Levothyroxine 150 mcg 1 pill QD.

## 2014-11-13 ENCOUNTER — Ambulatory Visit (INDEPENDENT_AMBULATORY_CARE_PROVIDER_SITE_OTHER): Payer: Commercial Managed Care - HMO | Admitting: Physician Assistant

## 2014-11-13 ENCOUNTER — Encounter: Payer: Self-pay | Admitting: Physician Assistant

## 2014-11-13 VITALS — BP 122/78 | HR 68 | Temp 97.9°F | Resp 16 | Ht 62.0 in | Wt 168.0 lb

## 2014-11-13 DIAGNOSIS — E559 Vitamin D deficiency, unspecified: Secondary | ICD-10-CM

## 2014-11-13 DIAGNOSIS — R7303 Prediabetes: Secondary | ICD-10-CM

## 2014-11-13 DIAGNOSIS — R7309 Other abnormal glucose: Secondary | ICD-10-CM

## 2014-11-13 DIAGNOSIS — E785 Hyperlipidemia, unspecified: Secondary | ICD-10-CM

## 2014-11-13 DIAGNOSIS — E039 Hypothyroidism, unspecified: Secondary | ICD-10-CM

## 2014-11-13 DIAGNOSIS — I10 Essential (primary) hypertension: Secondary | ICD-10-CM

## 2014-11-13 DIAGNOSIS — Z79899 Other long term (current) drug therapy: Secondary | ICD-10-CM

## 2014-11-13 MED ORDER — ALPRAZOLAM 1 MG PO TABS: 1.0000 mg | ORAL_TABLET | Freq: Two times a day (BID) | ORAL | Status: DC | PRN

## 2014-11-13 NOTE — Progress Notes (Signed)
Assessment and Plan:  Hypertension: Continue medication, monitor blood pressure at home. Continue DASH diet.  Reminder to go to the ER if any CP, SOB, nausea, dizziness, severe HA, changes vision/speech, left arm numbness and tingling, and jaw pain. Cholesterol: Continue diet and exercise. Check cholesterol.  Vitamin D Def- check level and continue medications.  Hypothyroidism-follows with Dr. Suzette Battiest continue medications the same, reminded to take on an empty stomach 30-14mins before food.  Obesity with co morbidities- long discussion about weight loss, diet, and exercise Anxiety- xanax BID PRN Fatigue- will wait until TSH is regulated, will then investigate other symptoms.   Continue diet and meds as discussed. No labs today, will check at physical  HPI 69 y.o. female  presents for 3 month follow up with hypertension, hyperlipidemia, prediabetes and vitamin D. Her blood pressure has been controlled at home, today their BP is BP: 122/78 mmHg She does not workout. She denies chest pain, shortness of breath, dizziness.  She is not on cholesterol medication and denies myalgias. Her cholesterol is at goal. The cholesterol last visit was:   Lab Results  Component Value Date   CHOL 189 07/31/2014   HDL 82 07/31/2014   LDLCALC 94 07/31/2014   TRIG 65 07/31/2014   CHOLHDL 2.3 07/31/2014  Last A1C in the office was:  Lab Results  Component Value Date   HGBA1C 5.2 07/31/2014  Patient is on Vitamin D supplement.   Lab Results  Component Value Date   VD25OH 30 07/31/2014  She is on thyroid medication and saw Dr. Suzette Battiest on Dec 9th and she is on 135mcg of thyroid medication. She complains fatgue but has been very frustrated with her thyroid not being regulated, once it is regulated, if she continues to have fatigue then she is willing to have it investigated.  Her medication was changed last visit.  Lab Results  Component Value Date   TSH 0.065* 09/02/2014  She sees Dr. Trudie Reed regularly and states  that her RA is well controlled on plaquenil and Methotrexate.  She is on xanax 1 at night to sleep and occ during the day takes 1 during the day.  BMI is Body mass index is 30.72 kg/(m^2)., she is working on diet and exercise. Wt Readings from Last 3 Encounters:  11/13/14 168 lb (76.204 kg)  08/05/14 166 lb 1.6 oz (75.342 kg)  07/31/14 170 lb 9.6 oz (77.384 kg)    Current Medications:  Current Outpatient Prescriptions on File Prior to Visit  Medication Sig Dispense Refill  . alendronate (FOSAMAX) 70 MG tablet Take 1 tablet (70 mg total) by mouth once a week. 12 tablet 3  . ALPRAZolam (XANAX) 1 MG tablet Take 1 mg by mouth daily.      . Cholecalciferol (VITAMIN D PO) Take 5,000 Units by mouth daily.     . folic acid (FOLVITE) 1 MG tablet Take 2 mg by mouth daily.     . hydroxychloroquine (PLAQUENIL) 200 MG tablet Take by mouth daily. Take 1 tab AM and PM    . levothyroxine (SYNTHROID, LEVOTHROID) 150 MCG tablet Take 1 tablet (150 mcg total) by mouth daily. 90 tablet 4  . losartan (COZAAR) 50 MG tablet Take 1 tablet daily for BP 90 tablet prn  . Methotrexate Sodium (METHOTREXATE PO) Take 2.5 mg by mouth. 10 pills weekly    . omeprazole (PRILOSEC) 40 MG capsule Take one capsule daily     No current facility-administered medications on file prior to visit.   Medical History:  Past Medical History  Diagnosis Date  . Hyperlipidemia   . Hypothyroid   . Anemia     Hx of   . H. pylori infection     Hx of   . GERD (gastroesophageal reflux disease)   . PUD (peptic ulcer disease)   . Calculus of bile duct without mention of cholecystitis or obstruction   . Arthritis   . Rheumatoid arthritis(714.0)   . Hypertension   . Cataract   . Heart murmur    Allergies:  Allergies  Allergen Reactions  . Ace Inhibitors Cough  . Ambien [Zolpidem] Other (See Comments)    Odd Feeling  . Crestor [Rosuvastatin]   . Pravastatin   . Prozac [Fluoxetine Hcl] Other (See Comments)    Decreased libido   . Zoloft [Sertraline Hcl]      Review of Systems:  ROS   Family history- Review and unchanged Social history- Review and unchanged Physical Exam: BP 122/78 mmHg  Pulse 68  Temp(Src) 97.9 F (36.6 C)  Resp 16  Ht 5\' 2"  (1.575 m)  Wt 168 lb (76.204 kg)  BMI 30.72 kg/m2 Wt Readings from Last 3 Encounters:  11/13/14 168 lb (76.204 kg)  08/05/14 166 lb 1.6 oz (75.342 kg)  07/31/14 170 lb 9.6 oz (77.384 kg)   General Appearance: Well nourished, in no apparent distress. Eyes: PERRLA, EOMs, conjunctiva no swelling or erythema Sinuses: No Frontal/maxillary tenderness ENT/Mouth: Ext aud canals clear, TMs without erythema, bulging. No erythema, swelling, or exudate on post pharynx.  Tonsils not swollen or erythematous. Hearing normal.  Neck: Supple, thyroid normal.  Respiratory: Respiratory effort normal, BS equal bilaterally without rales, rhonchi, wheezing or stridor.  Cardio: RRR with no MRGs. Brisk peripheral pulses without edema.  Abdomen: Soft, + BS.  Non tender, no guarding, rebound, hernias, masses. Lymphatics: Non tender without lymphadenopathy.  Musculoskeletal: Full ROM, 5/5 strength, normal gait.  Skin: Warm, dry without rashes, lesions, ecchymosis.  Neuro: Cranial nerves intact. Normal muscle tone, no cerebellar symptoms. Sensation intact.  Psych: Awake and oriented X 3, normal affect, Insight and Judgment appropriate.    Vicie Mutters, PA-C 1:39 PM [facility]

## 2014-11-13 NOTE — Patient Instructions (Signed)
Fatigue Fatigue is a feeling of tiredness, lack of energy, lack of motivation, or feeling tired all the time. Having enough rest, good nutrition, and reducing stress will normally reduce fatigue. Consult your caregiver if it persists. The nature of your fatigue will help your caregiver to find out its cause. The treatment is based on the cause.  CAUSES  There are many causes for fatigue. Most of the time, fatigue can be traced to one or more of your habits or routines. Most causes fit into one or more of three general areas. They are: Lifestyle problems  Sleep disturbances.  Overwork.  Physical exertion.  Unhealthy habits.  Poor eating habits or eating disorders.  Alcohol and/or drug use .  Lack of proper nutrition (malnutrition). Psychological problems  Stress and/or anxiety problems.  Depression.  Grief.  Boredom. Medical Problems or Conditions  Anemia.  Pregnancy.  Thyroid gland problems.  Recovery from major surgery.  Continuous pain.  Emphysema or asthma that is not well controlled  Allergic conditions.  Diabetes.  Infections (such as mononucleosis).  Obesity.  Sleep disorders, such as sleep apnea.  Heart failure or other heart-related problems.  Cancer.  Kidney disease.  Liver disease.  Effects of certain medicines such as antihistamines, cough and cold remedies, prescription pain medicines, heart and blood pressure medicines, drugs used for treatment of cancer, and some antidepressants. SYMPTOMS  The symptoms of fatigue include:   Lack of energy.  Lack of drive (motivation).  Drowsiness.  Feeling of indifference to the surroundings. DIAGNOSIS  The details of how you feel help guide your caregiver in finding out what is causing the fatigue. You will be asked about your present and past health condition. It is important to review all medicines that you take, including prescription and non-prescription items. A thorough exam will be done.  You will be questioned about your feelings, habits, and normal lifestyle. Your caregiver may suggest blood tests, urine tests, or other tests to look for common medical causes of fatigue.  TREATMENT  Fatigue is treated by correcting the underlying cause. For example, if you have continuous pain or depression, treating these causes will improve how you feel. Similarly, adjusting the dose of certain medicines will help in reducing fatigue.  HOME CARE INSTRUCTIONS   Try to get the required amount of good sleep every night.  Eat a healthy and nutritious diet, and drink enough water throughout the day.  Practice ways of relaxing (including yoga or meditation).  Exercise regularly.  Make plans to change situations that cause stress. Act on those plans so that stresses decrease over time. Keep your work and personal routine reasonable.  Avoid street drugs and minimize use of alcohol.  Start taking a daily multivitamin after consulting your caregiver. SEEK MEDICAL CARE IF:   You have persistent tiredness, which cannot be accounted for.  You have fever.  You have unintentional weight loss.  You have headaches.  You have disturbed sleep throughout the night.  You are feeling sad.  You have constipation.  You have dry skin.  You have gained weight.  You are taking any new or different medicines that you suspect are causing fatigue.  You are unable to sleep at night.  You develop any unusual swelling of your legs or other parts of your body. SEEK IMMEDIATE MEDICAL CARE IF:   You are feeling confused.  Your vision is blurred.  You feel faint or pass out.  You develop severe headache.  You develop severe abdominal, pelvic, or   back pain.  You develop chest pain, shortness of breath, or an irregular or fast heartbeat.  You are unable to pass a normal amount of urine.  You develop abnormal bleeding such as bleeding from the rectum or you vomit blood.  You have thoughts  about harming yourself or committing suicide.  You are worried that you might harm someone else. MAKE SURE YOU:   Understand these instructions.  Will watch your condition.  Will get help right away if you are not doing well or get worse. Document Released: 08/22/2007 Document Revised: 01/17/2012 Document Reviewed: 02/26/2014 [facility] Patient Information 2015 [place], [place]. This information is not intended to replace advice given to you by your health care provider. Make sure you discuss any questions you have with your health care provider. Sleep Apnea  Sleep apnea is a sleep disorder characterized by abnormal pauses in breathing while you sleep. When your breathing pauses, the level of oxygen in your blood decreases. This causes you to move out of deep sleep and into light sleep. As a result, your quality of sleep is poor, and the system that carries your blood throughout your body (cardiovascular system) experiences stress. If sleep apnea remains untreated, the following conditions can develop:  High blood pressure (hypertension).  Coronary artery disease.  Inability to achieve or maintain an erection (impotence).  Impairment of your thought process (cognitive dysfunction). There are three types of sleep apnea:  Obstructive sleep apnea--Pauses in breathing during sleep because of a blocked airway.  Central sleep apnea--Pauses in breathing during sleep because the area of the brain that controls your breathing does not send the correct signals to the muscles that control breathing.  Mixed sleep apnea--A combination of both obstructive and central sleep apnea. RISK FACTORS The following risk factors can increase your risk of developing sleep apnea:  Being overweight.  Smoking.  Having narrow passages in your nose and throat.  Being of older age.  Being female.  Alcohol use.  Sedative and tranquilizer use.  Ethnicity. Among individuals younger than 35 years, African  Americans are at increased risk of sleep apnea. SYMPTOMS   Difficulty staying asleep.  Daytime sleepiness and fatigue.  Loss of energy.  Irritability.  Loud, heavy snoring.  Morning headaches.  Trouble concentrating.  Forgetfulness.  Decreased interest in sex. DIAGNOSIS  In order to diagnose sleep apnea, your caregiver will perform a physical examination. Your caregiver may suggest that you take a home sleep test. Your caregiver may also recommend that you spend the night in a sleep lab. In the sleep lab, several monitors record information about your heart, lungs, and brain while you sleep. Your leg and arm movements and blood oxygen level are also recorded. TREATMENT The following actions may help to resolve mild sleep apnea:  Sleeping on your side.   Using a decongestant if you have nasal congestion.   Avoiding the use of depressants, including alcohol, sedatives, and narcotics.   Losing weight and modifying your diet if you are overweight. There also are devices and treatments to help open your airway:  Oral appliances. These are custom-made mouthpieces that shift your lower jaw forward and slightly open your bite. This opens your airway.  Devices that create positive airway pressure. This positive pressure "splints" your airway open to help you breathe better during sleep. The following devices create positive airway pressure:  Continuous positive airway pressure (CPAP) device. The CPAP device creates a continuous level of air pressure with an air pump. The air is delivered  to your airway through a mask while you sleep. This continuous pressure keeps your airway open.  Nasal expiratory positive airway pressure (EPAP) device. The EPAP device creates positive air pressure as you exhale. The device consists of single-use valves, which are inserted into each nostril and held in place by adhesive. The valves create very little resistance when you inhale but create much more  resistance when you exhale. That increased resistance creates the positive airway pressure. This positive pressure while you exhale keeps your airway open, making it easier to breath when you inhale again.  Bilevel positive airway pressure (BPAP) device. The BPAP device is used mainly in patients with central sleep apnea. This device is similar to the CPAP device because it also uses an air pump to deliver continuous air pressure through a mask. However, with the BPAP machine, the pressure is set at two different levels. The pressure when you exhale is lower than the pressure when you inhale.  Surgery. Typically, surgery is only done if you cannot comply with less invasive treatments or if the less invasive treatments do not improve your condition. Surgery involves removing excess tissue in your airway to create a wider passage way. Document Released: 10/15/2002 Document Revised: 02/19/2013 Document Reviewed: 03/02/2012 [facility] Patient Information 2015 [place], [place]. This information is not intended to replace advice given to you by your health care provider. Make sure you discuss any questions you have with your health care provider.

## 2014-12-09 ENCOUNTER — Ambulatory Visit: Payer: Self-pay | Admitting: Physician Assistant

## 2014-12-10 ENCOUNTER — Ambulatory Visit (INDEPENDENT_AMBULATORY_CARE_PROVIDER_SITE_OTHER): Payer: Commercial Managed Care - HMO | Admitting: Physician Assistant

## 2014-12-10 ENCOUNTER — Encounter: Payer: Self-pay | Admitting: Physician Assistant

## 2014-12-10 VITALS — BP 130/70 | HR 60 | Temp 97.7°F | Resp 16 | Ht 62.0 in | Wt 168.0 lb

## 2014-12-10 DIAGNOSIS — M6530 Trigger finger, unspecified finger: Secondary | ICD-10-CM

## 2014-12-10 NOTE — Progress Notes (Signed)
Assessment and Plan: Right trigger finger- will refer to Dr. Ninfa Linden for injection, in the mean time, ice, splint.    HPI 69 y.o. right handed white female presents for right handed thumb trigger finger for 1 month.  She has had operations with Dr. Fredna Dow on her other hand in the past but he is no longer taking her insurance. Patient complaints of right Thumb catching. This has been present 1 month however recently has become progressively more aggravating. This affects most every activity involving gripping, patient is right handed. Originally only a catching sensation was present, however now catching, locking is present. She presents today for evaluation  Treatment to date has been ice, NSAID's, without significant relief.  Past Medical History  Diagnosis Date  . Hyperlipidemia   . Hypothyroid   . Anemia     Hx of   . H. pylori infection     Hx of   . GERD (gastroesophageal reflux disease)   . PUD (peptic ulcer disease)   . Calculus of bile duct without mention of cholecystitis or obstruction   . Arthritis   . Rheumatoid arthritis(714.0)   . Hypertension   . Cataract   . Heart murmur      Allergies  Allergen Reactions  . Ace Inhibitors Cough  . Ambien [Zolpidem] Other (See Comments)    Odd Feeling  . Crestor [Rosuvastatin]   . Pravastatin   . Prozac [Fluoxetine Hcl] Other (See Comments)    Decreased libido  . Zoloft [Sertraline Hcl]       Current Outpatient Prescriptions on File Prior to Visit  Medication Sig Dispense Refill  . alendronate (FOSAMAX) 70 MG tablet Take 1 tablet (70 mg total) by mouth once a week. 12 tablet 3  . ALPRAZolam (XANAX) 1 MG tablet Take 1 tablet (1 mg total) by mouth 2 (two) times daily as needed for anxiety. 180 tablet 1  . Cholecalciferol (VITAMIN D PO) Take 5,000 Units by mouth daily.     . folic acid (FOLVITE) 1 MG tablet Take 2 mg by mouth daily.     . hydroxychloroquine (PLAQUENIL) 200 MG tablet Take by mouth daily. Take 1 tab AM and PM     . losartan (COZAAR) 50 MG tablet Take 1 tablet daily for BP 90 tablet prn  . Methotrexate Sodium (METHOTREXATE PO) Take 2.5 mg by mouth. 10 pills weekly    . omeprazole (PRILOSEC) 40 MG capsule Take one capsule daily     No current facility-administered medications on file prior to visit.    ROS: all negative except above.   Physical Exam: Filed Weights   12/10/14 0948  Weight: 168 lb (76.204 kg)   BP 130/70 mmHg  Pulse 60  Temp(Src) 97.7 F (36.5 C)  Resp 16  Ht 5\' 2"  (1.575 m)  Wt 168 lb (76.204 kg)  BMI 30.72 kg/m2 General Appearance: Well nourished, in no apparent distress. Eyes: PERRLA, EOMs, conjunctiva no swelling or erythema Sinuses: No Frontal/maxillary tenderness ENT/Mouth: Ext aud canals clear, TMs without erythema, bulging. No erythema, swelling, or exudate on post pharynx.  Tonsils not swollen or erythematous. Hearing normal.  Neck: Supple, thyroid normal.  Respiratory: Respiratory effort normal, BS equal bilaterally without rales, rhonchi, wheezing or stridor.  Cardio: RRR with no MRGs. Brisk peripheral pulses without edema.  Abdomen: Soft, + BS.  Non tender, no guarding, rebound, hernias, masses. Lymphatics: Non tender without lymphadenopathy.  Musculoskeletal: Full ROM, 5/5 strength, normal gait. Right thumb with catching, no erythema, swelling. + tenderness.  Goods sensation/cap refill distal.  Skin: Warm, dry without rashes, lesions, ecchymosis.  Neuro: Cranial nerves intact. Normal muscle tone, no cerebellar symptoms. Sensation intact.  Psych: Awake and oriented X 3, normal affect, Insight and Judgment appropriate.     Vicie Mutters, PA-C 10:21 AM [facility]

## 2014-12-10 NOTE — Patient Instructions (Addendum)
We have made a referral for you to get imaging or go to another doctor. We will try to get this as soon as possible but please understand that we often need to get approval with your insurance before we can schedule and not every office can accommodate you quickly. So please allow 5-7 business days for [place] to call you back about the referral.    Trigger Finger Trigger finger (digital tendinitis and stenosing tenosynovitis) is a common disorder that causes an often painful catching of the fingers or thumb. It occurs as a clicking, snapping, or locking of a finger in the palm of the hand. This is caused by a problem with the tendons that flex or bend the fingers sliding smoothly through their sheaths. The condition may occur in any finger or a couple fingers at the same time.  The finger may lock with the finger curled or suddenly straighten out with a snap. This is more common in patients with rheumatoid arthritis and diabetes. Left untreated, the condition may get worse to the point where the finger becomes locked in flexion, like making a fist, or less commonly locked with the finger straightened out. CAUSES   Inflammation and scarring that lead to swelling around the tendon sheath.  Repeated or forceful movements.  Rheumatoid arthritis, an autoimmune disease that affects joints.  Gout.  Diabetes mellitus. SIGNS AND SYMPTOMS  Soreness and swelling of your finger.  A painful clicking or snapping as you bend and straighten your finger. DIAGNOSIS  Your health care provider will do a physical exam of your finger to diagnose trigger finger. TREATMENT   Splinting for 6-8 weeks may be helpful.  Nonsteroidal anti-inflammatory medicines (NSAIDs) can help to relieve the pain and inflammation.  Cortisone injections, along with splinting, may speed up recovery. Several injections may be required. Cortisone may give relief after one injection.  Surgery is another treatment that may be used if  conservative treatments do not work. Surgery can be minor, without incisions (a cut does not have to be made), and can be done with a needle through the skin.  Other surgical choices involve an open procedure in which the surgeon opens the hand through a small incision and cuts the pulley so the tendon can again slide smoothly. Your hand will still work fine. HOME CARE INSTRUCTIONS  Apply ice to the injured area, twice per day:  Put ice in a plastic bag.  Place a towel between your skin and the bag.  Leave the ice on for 20 minutes, 3-4 times a day.  Rest your hand often. MAKE SURE YOU:   Understand these instructions.  Will watch your condition.  Will get help right away if you are not doing well or get worse. Document Released: 08/14/2004 Document Revised: 06/27/2013 Document Reviewed: 03/27/2013 [facility] Patient Information 2015 [place], [place]. This information is not intended to replace advice given to you by your health care provider. Make sure you discuss any questions you have with your health care provider.

## 2015-01-23 ENCOUNTER — Encounter: Payer: Self-pay | Admitting: Internal Medicine

## 2015-01-30 ENCOUNTER — Encounter: Payer: Self-pay | Admitting: Internal Medicine

## 2015-01-30 ENCOUNTER — Ambulatory Visit (INDEPENDENT_AMBULATORY_CARE_PROVIDER_SITE_OTHER): Payer: Commercial Managed Care - HMO | Admitting: Internal Medicine

## 2015-01-30 VITALS — BP 138/82 | HR 62 | Temp 98.6°F | Resp 18 | Ht 62.0 in | Wt 170.0 lb

## 2015-01-30 DIAGNOSIS — J069 Acute upper respiratory infection, unspecified: Secondary | ICD-10-CM

## 2015-01-30 MED ORDER — AZITHROMYCIN 250 MG PO TABS: ORAL_TABLET | ORAL | Status: DC

## 2015-01-30 MED ORDER — BENZONATATE 100 MG PO CAPS: 100.0000 mg | ORAL_CAPSULE | Freq: Four times a day (QID) | ORAL | Status: DC | PRN

## 2015-01-30 MED ORDER — PREDNISONE 20 MG PO TABS: ORAL_TABLET | ORAL | Status: DC

## 2015-01-30 NOTE — Progress Notes (Signed)
Patient ID: [name], female   DOB: [birth year], 69 y.o.   MRN: [id number]  HPI  Patient presents to the office for evaluation of cough.  It has been going on for 2 weeks.  Patient reports night > day, dry, no apparent relationship to exercise, worse with lying down.  They also endorse change in voice, postnasal drip, shortness of breath, wheezing and sinus pressure, congestion (milky green), sore throat..  They have tried antitussives, antibiotics or advil.  They report that nothing has worked.  They denies other sick contacts.  Review of Systems  Constitutional: Negative for fever, chills and malaise/fatigue.  HENT: Positive for congestion, ear pain and sore throat. Negative for ear discharge and nosebleeds.   Eyes: Negative.   Respiratory: Positive for cough, shortness of breath and wheezing. Negative for hemoptysis and sputum production.   Cardiovascular: Negative for chest pain and leg swelling.  Skin: Negative.   Neurological: Positive for headaches.    PE:  General:  Alert and non-toxic, WDWN, NAD HEENT: NCAT, PERLA, EOM normal, no occular discharge or erythema.  Nasal mucosal edema with sinus tenderness to palpation.  Oropharynx clear with minimal oropharyngeal edema and erythema.  Mucous membranes moist and pink. Neck:  Cervical adenopathy Chest:  RRR no MRGs.  Lungs clear to auscultation A&P with no wheezes rhonchi or rales.   Abdomen: +BS x 4 quadrants, soft, non-tender, no guarding, rigidity, or rebound. Skin: warm and dry no rash Neuro: A&Ox4, CN II-XII grossly intact  Filed Vitals:   01/30/15 1521  BP: 138/82  Pulse: 62  Temp: 98.6 F (37 C)  Resp: 18     Assessment and Plan:   1. Acute URI -likely viral URI vs bronchitis vs. Sinusitis -zpak -prednisone -tessalon -OTC antihistamine -nasal saline -Go to ER if worsening CP, SOB, or any other concerning symptoms.

## 2015-01-30 NOTE — Patient Instructions (Signed)
Upper Respiratory Infection, Adult An upper respiratory infection (URI) is also sometimes known as the common cold. The upper respiratory tract includes the nose, sinuses, throat, trachea, and bronchi. Bronchi are the airways leading to the lungs. Most people improve within 1 week, but symptoms can last up to 2 weeks. A residual cough may last even longer.  CAUSES Many different viruses can infect the tissues lining the upper respiratory tract. The tissues become irritated and inflamed and often become very moist. Mucus production is also common. A cold is contagious. You can easily spread the virus to others by oral contact. This includes kissing, sharing a glass, coughing, or sneezing. Touching your mouth or nose and then touching a surface, which is then touched by another person, can also spread the virus. SYMPTOMS  Symptoms typically develop 1 to 3 days after you come in contact with a cold virus. Symptoms vary from person to person. They may include:  Runny nose.  Sneezing.  Nasal congestion.  Sinus irritation.  Sore throat.  Loss of voice (laryngitis).  Cough.  Fatigue.  Muscle aches.  Loss of appetite.  Headache.  Low-grade fever. DIAGNOSIS  You might diagnose your own cold based on familiar symptoms, since most people get a cold 2 to 3 times a year. Your caregiver can confirm this based on your exam. Most importantly, your caregiver can check that your symptoms are not due to another disease such as strep throat, sinusitis, pneumonia, asthma, or epiglottitis. Blood tests, throat tests, and X-rays are not necessary to diagnose a common cold, but they may sometimes be helpful in excluding other more serious diseases. Your caregiver will decide if any further tests are required. RISKS AND COMPLICATIONS  You may be at risk for a more severe case of the common cold if you smoke cigarettes, have chronic heart disease (such as heart failure) or lung disease (such as asthma), or if  you have a weakened immune system. The very young and very old are also at risk for more serious infections. Bacterial sinusitis, middle ear infections, and bacterial pneumonia can complicate the common cold. The common cold can worsen asthma and chronic obstructive pulmonary disease (COPD). Sometimes, these complications can require emergency medical care and may be life-threatening. PREVENTION  The best way to protect against getting a cold is to practice good hygiene. Avoid oral or hand contact with people with cold symptoms. Wash your hands often if contact occurs. There is no clear evidence that vitamin C, vitamin E, echinacea, or exercise reduces the chance of developing a cold. However, it is always recommended to get plenty of rest and practice good nutrition. TREATMENT  Treatment is directed at relieving symptoms. There is no cure. Antibiotics are not effective, because the infection is caused by a virus, not by bacteria. Treatment may include:  Increased fluid intake. Sports drinks offer valuable electrolytes, sugars, and fluids.  Breathing heated mist or steam (vaporizer or shower).  Eating chicken soup or other clear broths, and maintaining good nutrition.  Getting plenty of rest.  Using gargles or lozenges for comfort.  Controlling fevers with ibuprofen or acetaminophen as directed by your caregiver.  Increasing usage of your inhaler if you have asthma. Zinc gel and zinc lozenges, taken in the first 24 hours of the common cold, can shorten the duration and lessen the severity of symptoms. Pain medicines may help with fever, muscle aches, and throat pain. A variety of non-prescription medicines are available to treat congestion and runny nose. Your caregiver   can make recommendations and may suggest nasal or lung inhalers for other symptoms.  HOME CARE INSTRUCTIONS   Only take over-the-counter or prescription medicines for pain, discomfort, or fever as directed by your  caregiver.  Use a warm mist humidifier or inhale steam from a shower to increase air moisture. This may keep secretions moist and make it easier to breathe.  Drink enough water and fluids to keep your urine clear or pale yellow.  Rest as needed.  Return to work when your temperature has returned to normal or as your caregiver advises. You may need to stay home longer to avoid infecting others. You can also use a face mask and careful hand washing to prevent spread of the virus. SEEK MEDICAL CARE IF:   After the first few days, you feel you are getting worse rather than better.  You need your caregiver's advice about medicines to control symptoms.  You develop chills, worsening shortness of breath, or brown or red sputum. These may be signs of pneumonia.  You develop yellow or brown nasal discharge or pain in the face, especially when you bend forward. These may be signs of sinusitis.  You develop a fever, swollen neck glands, pain with swallowing, or white areas in the back of your throat. These may be signs of strep throat. SEEK IMMEDIATE MEDICAL CARE IF:   You have a fever.  You develop severe or persistent headache, ear pain, sinus pain, or chest pain.  You develop wheezing, a prolonged cough, cough up blood, or have a change in your usual mucus (if you have chronic lung disease).  You develop sore muscles or a stiff neck. Document Released: 04/20/2001 Document Revised: 01/17/2012 Document Reviewed: 01/30/2014 [employer] Patient Information 2015 [employer]. This information is not intended to replace advice given to you by your health care provider. Make sure you discuss any questions you have with your health care provider.  

## 2015-02-19 ENCOUNTER — Ambulatory Visit (INDEPENDENT_AMBULATORY_CARE_PROVIDER_SITE_OTHER): Payer: Commercial Managed Care - HMO | Admitting: Internal Medicine

## 2015-02-19 ENCOUNTER — Encounter: Payer: Self-pay | Admitting: Internal Medicine

## 2015-02-19 VITALS — BP 122/72 | HR 60 | Temp 97.7°F | Resp 16 | Ht 62.0 in | Wt 166.6 lb

## 2015-02-19 DIAGNOSIS — E559 Vitamin D deficiency, unspecified: Secondary | ICD-10-CM

## 2015-02-19 DIAGNOSIS — E785 Hyperlipidemia, unspecified: Secondary | ICD-10-CM

## 2015-02-19 DIAGNOSIS — K219 Gastro-esophageal reflux disease without esophagitis: Secondary | ICD-10-CM

## 2015-02-19 DIAGNOSIS — R7303 Prediabetes: Secondary | ICD-10-CM

## 2015-02-19 DIAGNOSIS — I10 Essential (primary) hypertension: Secondary | ICD-10-CM

## 2015-02-19 DIAGNOSIS — E039 Hypothyroidism, unspecified: Secondary | ICD-10-CM

## 2015-02-19 DIAGNOSIS — M810 Age-related osteoporosis without current pathological fracture: Secondary | ICD-10-CM

## 2015-02-19 DIAGNOSIS — Z79899 Other long term (current) drug therapy: Secondary | ICD-10-CM

## 2015-02-19 DIAGNOSIS — R7309 Other abnormal glucose: Secondary | ICD-10-CM

## 2015-02-19 DIAGNOSIS — M069 Rheumatoid arthritis, unspecified: Secondary | ICD-10-CM

## 2015-02-19 LAB — LIPID PANEL
Cholesterol: 187 mg/dL (ref 0–200)
HDL: 68 mg/dL (ref >=46)
LDL CALC: 98 mg/dL (ref 0–99)
TRIGLYCERIDES: 105 mg/dL (ref <150)
Total CHOL/HDL Ratio: 2.8 Ratio
VLDL: 21 mg/dL (ref 0–40)

## 2015-02-19 LAB — BASIC METABOLIC PANEL WITH GFR
BUN: 5 mg/dL — AB (ref 6–23)
CHLORIDE: 102 meq/L (ref 96–112)
CO2: 28 mEq/L (ref 19–32)
Calcium: 9.8 mg/dL (ref 8.4–10.5)
Creat: 0.8 mg/dL (ref 0.50–1.10)
GFR, Est African American: 88 mL/min
GFR, Est Non African American: 77 mL/min
Glucose, Bld: 93 mg/dL (ref 70–99)
POTASSIUM: 4.1 meq/L (ref 3.5–5.3)
Sodium: 138 mEq/L (ref 135–145)

## 2015-02-19 LAB — HEMOGLOBIN A1C
HEMOGLOBIN A1C: 5.7 % — AB (ref <5.7)
Mean Plasma Glucose: 117 mg/dL — ABNORMAL HIGH (ref <117)

## 2015-02-19 LAB — TSH: TSH: 0.298 u[IU]/mL — AB (ref 0.350–4.500)

## 2015-02-19 LAB — MAGNESIUM: Magnesium: 1.9 mg/dL (ref 1.5–2.5)

## 2015-02-19 NOTE — Progress Notes (Signed)
Patient ID: [name], female   DOB: [birth year], 69 y.o.   MRN: [id number] Annual Comprehensive Examination  This very nice 69 y.o. MWF presents for complete physical.  Patient has been followed for HTN, Prediabetes, Hyperlipidemia, hypothyroidism and Vitamin D Deficiency. Patient also has hx/o Rheumatoid Arthritis dx'd in 1988 and currently is on MTX & Plaquanil & is followed by [facility]. She's done well w/o any apparent joint deformities.   HTN predates since 1997. Patient's BP has been controlled at home.Today's BP: 122/72 mmHg. Patient denies any cardiac symptoms as chest pain, palpitations, shortness of breath, dizziness or ankle swelling.   Patient's hyperlipidemia is controlled with diet and medications. Patient denies myalgias or other medication SE's. Last lipids were Total Chol 189; HDL 82; LDL 94; Triglycerides 65 on 07/31/2014.   Patient has prediabetes since since Jan 2011 with A1c 5.8% and patient denies reactive hypoglycemic symptoms, visual blurring, diabetic polys or paresthesias. Last A1c was 5.2% on  07/31/2014.   Patient has been on thyroid replacement since 1980. Finally, patient has history of Vitamin D Deficiency of 38 in 2008 and last vitamin D was  89 on 07/31/2014.   Medication Sig  . ALPRAZolam (XANAX) 1 MG tablet Take 1 tablet (1 mg total) by mouth 2 (two) times daily as needed for anxiety.  . Cholecalciferol (VITAMIN D PO) Take 5,000 Units by mouth daily.   . folic acid (FOLVITE) 1 MG tablet Take 2 mg by mouth daily.   . hydroxychloroquine (PLAQUENIL) 200 MG tablet Take by mouth daily. Take 1 tab AM and PM  . losartan (COZAAR) 50 MG tablet Take 1 tablet daily for BP  . omeprazole (PRILOSEC) 40 MG capsule Take one capsule daily  . levothyroxine  112 MCG tablet Take 112 mcg by mouth daily before breakfast.  . Methotrexate Sodium (METHOTREXATE PO) Take 2.5 mg by mouth. 10 pills weekly   Allergies  Allergen Reactions  . Ace Inhibitors Cough  . Ambien  [Zolpidem] Other (See Comments)    Odd Feeling  . Crestor [Rosuvastatin]   . Pravastatin   . Prozac [Fluoxetine Hcl] Other (See Comments)    Decreased libido  . Zoloft [Sertraline Hcl]    Past Medical History  Diagnosis Date  . Hyperlipidemia   . Hypothyroid   . Anemia     Hx of   . H. pylori infection     Hx of   . GERD (gastroesophageal reflux disease)   . PUD (peptic ulcer disease)   . Calculus of bile duct without mention of cholecystitis or obstruction   . Arthritis   . Rheumatoid arthritis(714.0)   . Hypertension   . Cataract   . Heart murmur    Health Maintenance  Topic Date Due  . ZOSTAVAX  05/17/2007  . PNA vac Low Risk Adult (2 of 2 - PCV13) 11/08/2013  . INFLUENZA VACCINE  06/09/2015  . MAMMOGRAM  01/08/2016  . COLONOSCOPY  03/08/2018  . TETANUS/TDAP  11/08/2022  . DEXA SCAN  Completed   Immunization History  Administered Date(s) Administered  . Influenza, High Dose Seasonal PF 07/16/2014  . Pneumococcal Polysaccharide-23 11/08/2012  . Td 11/08/2012   Past Surgical History  Procedure Laterality Date  . Vaginal hysterectomy      ovaries not removed  . Carpal tunnel release    . Cystectomy      left breast  . Av fistula repair    . Bladder suspension    . Pilonidal cyst excision    .  Trigger finger release    . Bunionectomy    . Ercp  09/29/2011    Procedure: ENDOSCOPIC RETROGRADE CHOLANGIOPANCREATOGRAPHY (ERCP);  Surgeon: Inda Castle, MD;  Location: [facility];  Service: Endoscopy;  Laterality: N/A;  . Tonsillectomy    . Cholecystectomy  09/07/2011  . Tenolysis  10/26/2011    Procedure: TENDON SHEATH RELEASE/TENOLYSIS;  Surgeon: Wynonia Sours, MD;  Location: [place];  Service: Orthopedics;  Laterality: Right;  tenosynovectomy removal superficialis slip right index finger  . 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 Enrigue Catena at [facility] History  Problem Relation Age of Onset  . Lung cancer Mother   .  Stomach cancer Father   . Heart attack Father     MI at age 108  . Colon cancer Maternal Grandfather   . Heart attack Brother     MI at age 28  . Prostate cancer Paternal Grandfather     History   Social History  . Marital Status: Married    Spouse Name: N/A  . Number of Children: 2  . Years of Education: N/A   Occupational History  . retired     Retired   Social History Main Topics  . Smoking status: Former Smoker    Quit date: 10/25/1995  . Smokeless tobacco: Never Used     Comment: Maryann Conners [id number]  . Alcohol Use: Yes     Comment: occasional  . Drug Use: No  . Sexual Activity: Yes    Birth Control/ Protection: Post-menopausal     ROS Constitutional: Denies fever, chills, weight loss/gain, headaches, insomnia,  night sweats or change in appetite. Does c/o fatigue. Eyes: Denies redness, blurred vision, diplopia, discharge, itchy or watery eyes.  ENT: Denies discharge, congestion, post nasal drip, epistaxis, sore throat, earache, hearing loss, dental pain, Tinnitus, Vertigo, Sinus pain or snoring.  Cardio: Denies chest pain, palpitations, irregular heartbeat, syncope, dyspnea, diaphoresis, orthopnea, PND, claudication or edema Respiratory: denies cough, dyspnea, DOE, pleurisy, hoarseness, laryngitis or wheezing.  Gastrointestinal: Denies dysphagia, heartburn, reflux, water brash, pain, cramps, nausea, vomiting, bloating, diarrhea, constipation, hematemesis, melena, hematochezia, jaundice or hemorrhoids Genitourinary: Denies dysuria, frequency, urgency, nocturia, hesitancy, discharge, hematuria or flank pain Musculoskeletal: Denies arthralgia, myalgia, stiffness, Jt. Swelling, pain, limp or strain/sprain. Denies Falls. Skin: Denies puritis, rash, hives, warts, acne, eczema or change in skin lesion Neuro: No weakness, tremor, incoordination, spasms, paresthesia or pain Psychiatric: Denies confusion, memory loss or sensory loss. Denies Depression. Endocrine: Denies change in weight,  skin, hair change, nocturia, and paresthesia, diabetic polys, visual blurring or hyper / hypo glycemic episodes.  Heme/Lymph: No excessive bleeding, bruising or enlarged lymph nodes.  Physical Exam  BP 122/72   Pulse 60  Temp 97.7 F   Resp 16  Ht 5\' 2"   Wt 166 lb 9.6 oz     BMI 30.46   General Appearance: Well nourished, in no apparent distress. Eyes: PERRLA, EOMs, conjunctiva no swelling or erythema, normal fundi and vessels. Sinuses: No frontal/maxillary tenderness ENT/Mouth: EACs patent / TMs  nl. Nares clear without erythema, swelling, mucoid exudates. Oral hygiene is good. No erythema, swelling, or exudate. Tongue normal, non-obstructing. Tonsils not swollen or erythematous. Hearing normal.  Neck: Supple, thyroid normal. No bruits, nodes or JVD. Respiratory: Respiratory effort normal.  BS equal and clear bilateral without rales, rhonci, wheezing or stridor. Cardio: Heart sounds are normal with regular rate and rhythm and no murmurs, rubs or gallops. Peripheral pulses are normal and equal bilaterally  without edema. No aortic or femoral bruits. Chest: symmetric with normal excursions and percussion.  Abdomen: Flat, soft, with bowl sounds. Nontender, no guarding, rebound, hernias, masses, or organomegaly.  Lymphatics: Non tender without lymphadenopathy.  Genitourinary: No hernias.Testes nl. DRE - prostate nl for age - smooth & firm w/o nodules. Musculoskeletal: Full ROM all peripheral extremities, joint stability, 5/5 strength, and normal gait. Skin: Warm and dry without rashes, lesions, cyanosis, clubbing or  ecchymosis.  Neuro: Cranial nerves intact, reflexes equal bilaterally. Normal muscle tone, no cerebellar symptoms. Sensation intact.  Pysch: Awake and oriented X 3 with normal affect, insight and judgment appropriate.   Assessment and Plan  1. Essential hypertension  - Microalbumin / creatinine urine ratio - EKG 12-Lead - [place], RETROPERITNL ABD,  LTD  2.  Hyperlipidemia  - Lipid panel  3. Prediabetes  - Hemoglobin A1c - Insulin, random  4. Vitamin D deficiency  - Vit D  25 hydroxy (rtn osteoporosis monitoring)  5. Hypothyroidism, unspecified hypothyroidism type  - TSH  6. Gastroesophageal reflux disease without esophagitis   7. Medication management  - Urine Microscopic - CBC with Differential/Platelet - BASIC METABOLIC PANEL WITH GFR - Hepatic function panel - Magnesium  8. Rheumatoid arthritis   9. Osteoporosis   Continue prudent diet as discussed, weight control, BP monitoring, regular exercise, and medications as discussed.  Discussed med effects and SE's. Routine screening labs and tests as requested with regular follow-up as recommended. Over 40 minutes of exam, counseling &  chart review was performed

## 2015-02-19 NOTE — Patient Instructions (Addendum)
GETTING OFF OF PPI's    Nexium/protonix/prilosec/Omeprazole/Dexilant/Aciphex are called PPI's, they are great at healing your stomach but should only be taken for a short period of time.     Recent studies have shown that taken for a long time they  can increase the risk of osteoporosis (weakening of your bones), pneumonia, low magnesium, restless legs, Cdiff (infection that causes diarrhea), DEMENTIA and most recently kidney damage / disease / insufficiency.     Due to this information we want to try to stop the PPI but if you try to stop it abruptly this can cause rebound acid and worsening symptoms.   So this is how we want you to get off the PPI:  - Start taking the nexium/protonix/prilosec/PPI  every other day with  zantac (ranitidine) 2 x a day for 2-4 weeks  - then decrease the PPI to every 3 days while taking the zantac (ranitidine) twice a day the other  days for 2-4  Weeks  - then you can try the zantac (ranitidine) once at night or up to 2 x day as needed.  - you can continue on this once at night or stop all together  - Avoid alcohol, spicy foods, NSAIDS (aleve, ibuprofen) at this time. See foods below.   +++++++++++++++++++++++++++++++++++++++++++  Food Choices for Gastroesophageal Reflux Disease  When you have gastroesophageal reflux disease (GERD), the foods you eat and your eating habits are very important. Choosing the right foods can help ease the discomfort of GERD. WHAT GENERAL GUIDELINES DO I NEED TO FOLLOW?  Choose fruits, vegetables, whole grains, low-fat dairy products, and low-fat meat, fish, and poultry.  Limit fats such as oils, salad dressings, butter, nuts, and avocado.  Keep a food diary to identify foods that cause symptoms.  Avoid foods that cause reflux. These may be different for different people.  Eat frequent small meals instead of three large meals each day.  Eat your meals slowly, in a relaxed setting.  Limit fried foods.  Cook foods  using methods other than frying.  Avoid drinking alcohol.  Avoid drinking large amounts of liquids with your meals.  Avoid bending over or lying down until 2-3 hours after eating.   WHAT FOODS ARE NOT RECOMMENDED? The following are some foods and drinks that may worsen your symptoms:  Vegetables Tomatoes. Tomato juice. Tomato and spaghetti sauce. Chili peppers. Onion and garlic. Horseradish. Fruits Oranges, grapefruit, and lemon (fruit and juice). Meats High-fat meats, fish, and poultry. This includes hot dogs, ribs, ham, sausage, salami, and bacon. Dairy Whole milk and chocolate milk. Sour cream. Cream. Butter. Ice cream. Cream cheese.  Beverages Coffee and tea, with or without caffeine. Carbonated beverages or energy drinks. Condiments Hot sauce. Barbecue sauce.  Sweets/Desserts Chocolate and cocoa. Donuts. Peppermint and spearmint. Fats and Oils High-fat foods, including [place] fries and potato chips. Other Vinegar. Strong spices, such as black pepper, white pepper, red pepper, cayenne, curry powder, cloves, ginger, and chili powder. Nexium/protonix/prilosec are called PPI's, they are great at healing your stomach but should only be taken for a short period of time.   +++++++++++++++++++++++++++++++++++++++++++++++++ Recommend the book "The END of DIETING" by Dr Excell Seltzer   & the book "The END of DIABETES " by Dr Excell Seltzer  At [email or web address] - get book & Audio CD's      Being diabetic has a  300% increased risk for heart attack, stroke, cancer, and alzheimer- type vascular dementia. It is very important that you work harder with diet by  avoiding all foods that are white. Avoid white rice (brown & wild rice is OK), white potatoes (sweetpotatoes in moderation is OK), White bread or wheat bread or anything made out of white flour like bagels, donuts, rolls, buns, biscuits, cakes, pastries, cookies, pizza crust, and pasta (made from white flour & egg whites) - vegetarian  pasta or spinach or wheat pasta is OK. Multigrain breads like Arnold's or [employer], or multigrain sandwich thins or flatbreads.  Diet, exercise and weight loss can reverse and cure diabetes in the early stages.  Diet, exercise and weight loss is very important in the control and prevention of complications of diabetes which affects every system in your body, ie. Brain - dementia/stroke, eyes - glaucoma/blindness, heart - heart attack/heart failure, kidneys - dialysis, stomach - gastric paralysis, intestines - malabsorption, nerves - severe painful neuritis, circulation - gangrene & loss of a leg(s), and finally cancer and Alzheimers.    I recommend avoid fried & greasy foods,  sweets/candy, white rice (brown or wild rice or Quinoa is OK), white potatoes (sweet potatoes are OK) - anything made from white flour - bagels, doughnuts, rolls, buns, biscuits,white and wheat breads, pizza crust and traditional pasta made of white flour & egg white(vegetarian pasta or spinach or wheat pasta is OK).  Multi-grain bread is OK - like multi-grain flat bread or sandwich thins. Avoid alcohol in excess. Exercise is also important.    Eat all the vegetables you want - avoid meat, especially red meat and dairy - especially cheese.  Cheese is the most concentrated form of trans-fats which is the worst thing to clog up our arteries. Veggie cheese is OK which can be found in the fresh produce section at [employer] or [employer] or [employer]  Preventive Care for Adults A healthy lifestyle and preventive care can promote health and wellness. Preventive health guidelines for women include the following key practices.  A routine yearly physical is a good way to check with your health care provider about your health and preventive screening. It is a chance to share any concerns and updates on your health and to receive a thorough exam.  Visit your dentist for a routine exam and preventive care every 6 months. Brush your  teeth twice a day and floss once a day. Good oral hygiene prevents tooth decay and gum disease.  The frequency of eye exams is based on your age, health, family medical history, use of contact lenses, and other factors. Follow your health care provider's recommendations for frequency of eye exams.  Eat a healthy diet. Foods like vegetables, fruits, whole grains, low-fat dairy products, and lean protein foods contain the nutrients you need without too many calories. Decrease your intake of foods high in solid fats, added sugars, and salt. Eat the right amount of calories for you.Get information about a proper diet from your health care provider, if necessary.  Regular physical exercise is one of the most important things you can do for your health. Most adults should get at least 150 minutes of moderate-intensity exercise (any activity that increases your heart rate and causes you to sweat) each week. In addition, most adults need muscle-strengthening exercises on 2 or more days a week.  Maintain a healthy weight. The body mass index (BMI) is a screening tool to identify possible weight problems. It provides an estimate of body fat based on height and weight. Your health care provider can find your BMI and can help you achieve or maintain a healthy  weight.For adults 20 years and older:  A BMI below 18.5 is considered underweight.  A BMI of 18.5 to 24.9 is normal.  A BMI of 25 to 29.9 is considered overweight.  A BMI of 30 and above is considered obese.  Maintain normal blood lipids and cholesterol levels by exercising and minimizing your intake of saturated fat. Eat a balanced diet with plenty of fruit and vegetables. If your lipid or cholesterol levels are high, you are over 50, or you are at high risk for heart disease, you may need your cholesterol levels checked more frequently.Ongoing high lipid and cholesterol levels should be treated with medicines if diet and exercise are not  working.  If you smoke, find out from your health care provider how to quit. If you do not use tobacco, do not start.  Lung cancer screening is recommended for adults aged 39-80 years who are at high risk for developing lung cancer because of a history of smoking. A yearly low-dose CT scan of the lungs is recommended for people who have at least a 30-pack-year history of smoking and are a current smoker or have quit within the past 15 years. A pack year of smoking is smoking an average of 1 pack of cigarettes a day for 1 year (for example: 1 pack a day for 30 years or 2 packs a day for 15 years). Yearly screening should continue until the smoker has stopped smoking for at least 15 years. Yearly screening should be stopped for people who develop a health problem that would prevent them from having lung cancer treatment.  Avoid use of street drugs. Do not share needles with anyone. Ask for help if you need support or instructions about stopping the use of drugs.  High blood pressure causes heart disease and increases the risk of stroke.  Ongoing high blood pressure should be treated with medicines if weight loss and exercise do not work.  If you are 60-51 years old, ask your health care provider if you should take aspirin to prevent strokes.  Diabetes screening involves taking a blood sample to check your fasting blood sugar level. This should be done once every 3 years, after age 71, if you are within normal weight and without risk factors for diabetes. Testing should be considered at a younger age or be carried out more frequently if you are overweight and have at least 1 risk factor for diabetes.  Breast cancer screening is essential preventive care for women. You should practice "breast self-awareness." This means understanding the normal appearance and feel of your breasts and may include breast self-examination. Any changes detected, no matter how small, should be reported to a health care  provider. Women in their 47s and 30s should have a clinical breast exam (CBE) by a health care provider as part of a regular health exam every 1 to 3 years. After age 42, women should have a CBE every year. Starting at age 28, women should consider having a mammogram (breast X-ray test) every year. Women who have a family history of breast cancer should talk to their health care provider about genetic screening. Women at a high risk of breast cancer should talk to their health care providers about having an MRI and a mammogram every year.  Breast cancer gene (BRCA)-related cancer risk assessment is recommended for women who have family members with BRCA-related cancers. BRCA-related cancers include breast, ovarian, tubal, and peritoneal cancers. Having family members with these cancers may be associated with an  increased risk for harmful changes (mutations) in the breast cancer genes BRCA1 and BRCA2. Results of the assessment will determine the need for genetic counseling and BRCA1 and BRCA2 testing.  Routine pelvic exams to screen for cancer are no longer recommended for nonpregnant women who are considered low risk for cancer of the pelvic organs (ovaries, uterus, and vagina) and who do not have symptoms. Ask your health care provider if a screening pelvic exam is right for you.  If you have had past treatment for cervical cancer or a condition that could lead to cancer, you need Pap tests and screening for cancer for at least 20 years after your treatment. If Pap tests have been discontinued, your risk factors (such as having a new sexual partner) need to be reassessed to determine if screening should be resumed. Some women have medical problems that increase the chance of getting cervical cancer. In these cases, your health care provider may recommend more frequent screening and Pap tests.    Colorectal cancer can be detected and often prevented. Most routine colorectal cancer screening begins at the  age of 29 years and continues through age 25 years. However, your health care provider may recommend screening at an earlier age if you have risk factors for colon cancer. On a yearly basis, your health care provider may provide home test kits to check for hidden blood in the stool. Use of a small camera at the end of a tube, to directly examine the colon (sigmoidoscopy or colonoscopy), can detect the earliest forms of colorectal cancer. Talk to your health care provider about this at age 21, when routine screening begins. Direct exam of the colon should be repeated every 5-10 years through age 86 years, unless early forms of pre-cancerous polyps or small growths are found.  Osteoporosis is a disease in which the bones lose minerals and strength with aging. This can result in serious bone fractures or breaks. The risk of osteoporosis can be identified using a bone density scan. Women ages 45 years and over and women at risk for fractures or osteoporosis should discuss screening with their health care providers. Ask your health care provider whether you should take a calcium supplement or vitamin D to reduce the rate of osteoporosis.  Menopause can be associated with physical symptoms and risks. Hormone replacement therapy is available to decrease symptoms and risks. You should talk to your health care provider about whether hormone replacement therapy is right for you.  Use sunscreen. Apply sunscreen liberally and repeatedly throughout the day. You should seek shade when your shadow is shorter than you. Protect yourself by wearing long sleeves, pants, a wide-brimmed hat, and sunglasses year round, whenever you are outdoors.  Once a month, do a whole body skin exam, using a mirror to look at the skin on your back. Tell your health care provider of new moles, moles that have irregular borders, moles that are larger than a pencil eraser, or moles that have changed in shape or color.  Stay current with  required vaccines (immunizations).  Influenza vaccine. All adults should be immunized every year.  Tetanus, diphtheria, and acellular pertussis (Td, Tdap) vaccine. Pregnant women should receive 1 dose of Tdap vaccine during each pregnancy. The dose should be obtained regardless of the length of time since the last dose. Immunization is preferred during the 27th-36th week of gestation. An adult who has not previously received Tdap or who does not know her vaccine status should receive 1 dose of Tdap.  This initial dose should be followed by tetanus and diphtheria toxoids (Td) booster doses every 10 years. Adults with an unknown or incomplete history of completing a 3-dose immunization series with Td-containing vaccines should begin or complete a primary immunization series including a Tdap dose. Adults should receive a Td booster every 10 years.    Zoster vaccine. One dose is recommended for adults aged 51 years or older unless certain conditions are present.    Pneumococcal 13-valent conjugate (PCV13) vaccine. When indicated, a person who is uncertain of her immunization history and has no record of immunization should receive the PCV13 vaccine. An adult aged 42 years or older who has certain medical conditions and has not been previously immunized should receive 1 dose of PCV13 vaccine. This PCV13 should be followed with a dose of pneumococcal polysaccharide (PPSV23) vaccine. The PPSV23 vaccine dose should be obtained at least 8 weeks after the dose of PCV13 vaccine. An adult aged 43 years or older who has certain medical conditions and previously received 1 or more doses of PPSV23 vaccine should receive 1 dose of PCV13. The PCV13 vaccine dose should be obtained 1 or more years after the last PPSV23 vaccine dose.    Pneumococcal polysaccharide (PPSV23) vaccine. When PCV13 is also indicated, PCV13 should be obtained first. All adults aged 77 years and older should be immunized. An adult younger than  age 69 years who has certain medical conditions should be immunized. Any person who resides in a nursing home or long-term care facility should be immunized. An adult smoker should be immunized. People with an immunocompromised condition and certain other conditions should receive both PCV13 and PPSV23 vaccines. People with human immunodeficiency virus (HIV) infection should be immunized as soon as possible after diagnosis. Immunization during chemotherapy or radiation therapy should be avoided. Routine use of PPSV23 vaccine is not recommended for [place] Indians, [place] Natives, or people younger than 65 years unless there are medical conditions that require PPSV23 vaccine. When indicated, people who have unknown immunization and have no record of immunization should receive PPSV23 vaccine. One-time revaccination 5 years after the first dose of PPSV23 is recommended for people aged 19-64 years who have chronic kidney failure, nephrotic syndrome, asplenia, or immunocompromised conditions. People who received 1-2 doses of PPSV23 before age 49 years should receive another dose of PPSV23 vaccine at age 71 years or later if at least 5 years have passed since the previous dose. Doses of PPSV23 are not needed for people immunized with PPSV23 at or after age 54 years.   Preventive Services / Frequency  Ages 79 years and over  Blood pressure check.  Lipid and cholesterol check.  Lung cancer screening. / Every year if you are aged 30-80 years and have a 30-pack-year history of smoking and currently smoke or have quit within the past 15 years. Yearly screening is stopped once you have quit smoking for at least 15 years or develop a health problem that would prevent you from having lung cancer treatment.  Clinical breast exam.** / Every year after age 37 years.  BRCA-related cancer risk assessment.** / For women who have family members with a BRCA-related cancer (breast, ovarian, tubal, or peritoneal  cancers).  Mammogram.** / Every year beginning at age 5 years and continuing for as long as you are in good health. Consult with your health care provider.  Pap test.** / Every 3 years starting at age 63 years through age 24 or 60 years with 3 consecutive normal Pap tests.  Testing can be stopped between 65 and 70 years with 3 consecutive normal Pap tests and no abnormal Pap or HPV tests in the past 10 years.  Fecal occult blood test (FOBT) of stool. / Every year beginning at age 43 years and continuing until age 26 years. You may not need to do this test if you get a colonoscopy every 10 years.  Flexible sigmoidoscopy or colonoscopy.** / Every 5 years for a flexible sigmoidoscopy or every 10 years for a colonoscopy beginning at age 45 years and continuing until age 45 years.  Hepatitis C blood test.** / For all people born from [birth year] through 1964 and any individual with known risks for hepatitis C.  Osteoporosis screening.** / A one-time screening for women ages 81 years and over and women at risk for fractures or osteoporosis.  Skin self-exam. / Monthly.  Influenza vaccine. / Every year.  Tetanus, diphtheria, and acellular pertussis (Tdap/Td) vaccine.** / 1 dose of Td every 10 years.  Zoster vaccine.** / 1 dose for adults aged 19 years or older.  Pneumococcal 13-valent conjugate (PCV13) vaccine.** / Consult your health care provider.  Pneumococcal polysaccharide (PPSV23) vaccine.** / 1 dose for all adults aged 32 years and older. Screening for abdominal aortic aneurysm (AAA)  by ultrasound is recommended for people who have history of high blood pressure or who are current or former smokers.

## 2015-02-20 LAB — URINALYSIS, MICROSCOPIC ONLY
BACTERIA UA: NONE SEEN
Casts: NONE SEEN
Crystals: NONE SEEN
Squamous Epithelial / LPF: NONE SEEN

## 2015-02-20 LAB — VITAMIN D 25 HYDROXY (VIT D DEFICIENCY, FRACTURES): Vit D, 25-Hydroxy: 52 ng/mL (ref 30–100)

## 2015-02-20 LAB — MICROALBUMIN / CREATININE URINE RATIO
Creatinine, Urine: 23.5 mg/dL
Microalb, Ur: <0.2 mg/dL (ref <2.0)

## 2015-02-20 LAB — INSULIN, RANDOM: Insulin: 22.6 u[IU]/mL — ABNORMAL HIGH (ref 2.0–19.6)

## 2015-05-05 ENCOUNTER — Other Ambulatory Visit: Payer: Self-pay

## 2015-05-06 ENCOUNTER — Other Ambulatory Visit: Payer: Self-pay | Admitting: Internal Medicine

## 2015-05-27 ENCOUNTER — Ambulatory Visit (INDEPENDENT_AMBULATORY_CARE_PROVIDER_SITE_OTHER): Payer: Commercial Managed Care - HMO | Admitting: Internal Medicine

## 2015-05-27 ENCOUNTER — Encounter: Payer: Self-pay | Admitting: Internal Medicine

## 2015-05-27 VITALS — BP 118/64 | HR 60 | Temp 98.2°F | Resp 18 | Ht 62.0 in | Wt 157.0 lb

## 2015-05-27 DIAGNOSIS — E785 Hyperlipidemia, unspecified: Secondary | ICD-10-CM

## 2015-05-27 DIAGNOSIS — E559 Vitamin D deficiency, unspecified: Secondary | ICD-10-CM

## 2015-05-27 DIAGNOSIS — E669 Obesity, unspecified: Secondary | ICD-10-CM

## 2015-05-27 DIAGNOSIS — I10 Essential (primary) hypertension: Secondary | ICD-10-CM

## 2015-05-27 DIAGNOSIS — Z79899 Other long term (current) drug therapy: Secondary | ICD-10-CM

## 2015-05-27 DIAGNOSIS — R7303 Prediabetes: Secondary | ICD-10-CM

## 2015-05-27 DIAGNOSIS — R7309 Other abnormal glucose: Secondary | ICD-10-CM

## 2015-05-27 DIAGNOSIS — E039 Hypothyroidism, unspecified: Secondary | ICD-10-CM

## 2015-05-27 LAB — LIPID PANEL
Cholesterol: 173 mg/dL (ref 0–200)
HDL: 72 mg/dL (ref >=46)
LDL Cholesterol: 88 mg/dL (ref 0–99)
TRIGLYCERIDES: 67 mg/dL (ref <150)
Total CHOL/HDL Ratio: 2.4 Ratio
VLDL: 13 mg/dL (ref 0–40)

## 2015-05-27 LAB — BASIC METABOLIC PANEL WITH GFR
BUN: 8 mg/dL (ref 6–23)
CO2: 27 meq/L (ref 19–32)
Calcium: 10.1 mg/dL (ref 8.4–10.5)
Chloride: 102 mEq/L (ref 96–112)
Creat: 0.78 mg/dL (ref 0.50–1.10)
GFR, EST NON AFRICAN AMERICAN: 78 mL/min
GLUCOSE: 76 mg/dL (ref 70–99)
POTASSIUM: 4.1 meq/L (ref 3.5–5.3)
Sodium: 137 mEq/L (ref 135–145)

## 2015-05-27 LAB — HEMOGLOBIN A1C
HEMOGLOBIN A1C: 5.4 % (ref <5.7)
Mean Plasma Glucose: 108 mg/dL (ref <117)

## 2015-05-27 LAB — HEPATIC FUNCTION PANEL
ALT: 22 U/L (ref 0–35)
AST: 33 U/L (ref 0–37)
Albumin: 3.8 g/dL (ref 3.5–5.2)
Alkaline Phosphatase: 96 U/L (ref 39–117)
BILIRUBIN INDIRECT: 0.5 mg/dL (ref 0.2–1.2)
Bilirubin, Direct: 0.1 mg/dL (ref 0.0–0.3)
Total Bilirubin: 0.6 mg/dL (ref 0.2–1.2)
Total Protein: 6.1 g/dL (ref 6.0–8.3)

## 2015-05-27 LAB — MAGNESIUM: Magnesium: 1.9 mg/dL (ref 1.5–2.5)

## 2015-05-27 NOTE — Progress Notes (Signed)
Assessment and Plan:  Hypertension:  -Continue medication, Cut meds in half, send message in 2 weeks to see if BP is doing okay on new dose prior to refilling medication -monitor blood pressure at home.  -Continue DASH diet.   -Reminder to go to the ER if any CP, SOB, nausea, dizziness, severe HA, changes vision/speech, left arm numbness and tingling, and jaw pain.  Cholesterol: -Continue diet and exercise.  -Check cholesterol.   Pre-diabetes: -Continue diet and exercise.  -Check A1C  Vitamin D Def: -check level -continue medications.   Continue diet and meds as discussed. Further disposition pending results of labs.  HPI 69 y.o. female  presents for 3 month follow up with hypertension, hyperlipidemia, prediabetes and vitamin D.   Her blood pressure has been controlled at home, today their BP is BP: 118/64 mmHg.   She does workout. She denies chest pain, shortness of breath, dizziness.  She reports that she walks 2 miles every morning.     She is on cholesterol medication and denies myalgias. Her cholesterol is at goal. The cholesterol last visit was:   Lab Results  Component Value Date   CHOL 187 02/19/2015   HDL 68 02/19/2015   LDLCALC 98 02/19/2015   TRIG 105 02/19/2015   CHOLHDL 2.8 02/19/2015     She has been working on diet and exercise for prediabetes, and denies foot ulcerations, hyperglycemia, hypoglycemia , increased appetite, nausea, paresthesia of the feet, polydipsia, polyuria, visual disturbances, vomiting and weight loss. Last A1C in the office was:  Lab Results  Component Value Date   HGBA1C 5.7* 02/19/2015    Patient is on Vitamin D supplement.  Lab Results  Component Value Date   VD25OH 35 02/19/2015     She reports that she has been getting better with her thyroid but she is upset with her endocrinologist.    She is also seeing Dr. Trudie Reed and she does not need [place] to send over her LFTs.  Current Medications:  Current Outpatient Prescriptions on  File Prior to Visit  Medication Sig Dispense Refill  . alendronate (FOSAMAX) 70 MG tablet TAKE 1 TABLET ONE TIME WEEKLY 12 tablet 3  . ALPRAZolam (XANAX) 1 MG tablet Take 1 tablet (1 mg total) by mouth 2 (two) times daily as needed for anxiety. 180 tablet 1  . Cholecalciferol (VITAMIN D PO) Take 5,000 Units by mouth daily.     . folic acid (FOLVITE) 1 MG tablet Take 2 mg by mouth daily.     . hydroxychloroquine (PLAQUENIL) 200 MG tablet Take by mouth daily. Take 1 tab AM and PM    . losartan (COZAAR) 50 MG tablet Take 1 tablet daily for BP 90 tablet prn  . methotrexate (RHEUMATREX) 2.5 MG tablet 25 mg once a week.      No current facility-administered medications on file prior to visit.    Medical History:  Past Medical History  Diagnosis Date  . Hyperlipidemia   . Hypothyroid   . Anemia     Hx of   . H. pylori infection     Hx of   . GERD (gastroesophageal reflux disease)   . PUD (peptic ulcer disease)   . Calculus of bile duct without mention of cholecystitis or obstruction   . Arthritis   . Rheumatoid arthritis(714.0)   . Hypertension   . Cataract   . Heart murmur     Allergies:  Allergies  Allergen Reactions  . Ace Inhibitors Cough  . Ambien [Zolpidem]  Other (See Comments)    Odd Feeling  . Crestor [Rosuvastatin]   . Pravastatin   . Prozac [Fluoxetine Hcl] Other (See Comments)    Decreased libido  . Zoloft [Sertraline Hcl]      Review of Systems:  ROS  Family history- Review and unchanged  Social history- Review and unchanged  Physical Exam: BP 118/64 mmHg  Pulse 60  Temp(Src) 98.2 F (36.8 C) (Temporal)  Resp 18  Ht 5\' 2"  (1.575 m)  Wt 157 lb (71.215 kg)  BMI 28.71 kg/m2 Wt Readings from Last 3 Encounters:  05/27/15 157 lb (71.215 kg)  02/19/15 166 lb 9.6 oz (75.569 kg)  01/30/15 170 lb (77.111 kg)    General Appearance: Well nourished well developed, in no apparent distress. Eyes: PERRLA, EOMs, conjunctiva no swelling or erythema ENT/Mouth:  Ear canals normal without obstruction, swelling, erythma, discharge.  TMs normal bilaterally.  Oropharynx moist, clear, without exudate, or postoropharyngeal swelling. Neck: Supple, thyroid normal,no cervical adenopathy  Respiratory: Respiratory effort normal, Breath sounds clear A&P without rhonchi, wheeze, or rale.  No retractions, no accessory usage. Cardio: RRR with no MRGs. Brisk peripheral pulses without edema.  Abdomen: Soft, + BS,  Non tender, no guarding, rebound, hernias, masses. Musculoskeletal: Full ROM, 5/5 strength, Normal gait Skin: Warm, dry without rashes, lesions, ecchymosis.  Neuro: Awake and oriented X 3, Cranial nerves intact. Normal muscle tone, no cerebellar symptoms. Psych: Normal affect, Insight and Judgment appropriate.    Starlyn Skeans, PA-C 3:03 PM [facility]

## 2015-05-27 NOTE — Patient Instructions (Signed)
Please cut your blood pressure medication in half and keep an unofficial blood pressure log.  Call me or send me a mychart message in 2 weeks and let me know what you blood pressure has been running at home.

## 2015-05-28 LAB — CBC WITH DIFFERENTIAL/PLATELET
Basophils Absolute: 0.1 10*3/uL (ref 0.0–0.1)
Basophils Relative: 1 % (ref 0–1)
EOS ABS: 0.3 10*3/uL (ref 0.0–0.7)
Eosinophils Relative: 5 % (ref 0–5)
HCT: 38.2 % (ref 36.0–46.0)
Hemoglobin: 12.5 g/dL (ref 12.0–15.0)
Lymphocytes Relative: 30 % (ref 12–46)
Lymphs Abs: 1.6 10*3/uL (ref 0.7–4.0)
MCH: 29.6 pg (ref 26.0–34.0)
MCHC: 32.7 g/dL (ref 30.0–36.0)
MCV: 90.3 fL (ref 78.0–100.0)
MPV: 13 fL — AB (ref 8.6–12.4)
Monocytes Absolute: 0.4 10*3/uL (ref 0.1–1.0)
Monocytes Relative: 7 % (ref 3–12)
NEUTROS ABS: 3.1 10*3/uL (ref 1.7–7.7)
Neutrophils Relative %: 57 % (ref 43–77)
Platelets: 141 10*3/uL — ABNORMAL LOW (ref 150–400)
RBC: 4.23 MIL/uL (ref 3.87–5.11)
RDW: 14.3 % (ref 11.5–15.5)
WBC: 5.4 10*3/uL (ref 4.0–10.5)

## 2015-05-28 LAB — VITAMIN D 25 HYDROXY (VIT D DEFICIENCY, FRACTURES): Vit D, 25-Hydroxy: 70 ng/mL (ref 30–100)

## 2015-05-28 LAB — INSULIN, RANDOM: Insulin: 4.8 u[IU]/mL (ref 2.0–19.6)

## 2015-06-16 ENCOUNTER — Encounter: Payer: Self-pay | Admitting: Internal Medicine

## 2015-06-16 ENCOUNTER — Other Ambulatory Visit: Payer: Self-pay | Admitting: Internal Medicine

## 2015-06-16 DIAGNOSIS — I10 Essential (primary) hypertension: Secondary | ICD-10-CM

## 2015-06-16 MED ORDER — LOSARTAN POTASSIUM 50 MG PO TABS: ORAL_TABLET | ORAL | Status: DC

## 2015-07-02 ENCOUNTER — Ambulatory Visit (INDEPENDENT_AMBULATORY_CARE_PROVIDER_SITE_OTHER): Payer: Commercial Managed Care - HMO | Admitting: Physician Assistant

## 2015-07-02 VITALS — BP 130/78 | HR 72 | Temp 97.7°F | Resp 16 | Ht 62.0 in | Wt 151.0 lb

## 2015-07-02 DIAGNOSIS — Z6827 Body mass index (BMI) 27.0-27.9, adult: Secondary | ICD-10-CM

## 2015-07-02 DIAGNOSIS — K219 Gastro-esophageal reflux disease without esophagitis: Secondary | ICD-10-CM

## 2015-07-02 DIAGNOSIS — E785 Hyperlipidemia, unspecified: Secondary | ICD-10-CM | POA: Diagnosis not present

## 2015-07-02 DIAGNOSIS — Z1331 Encounter for screening for depression: Secondary | ICD-10-CM

## 2015-07-02 DIAGNOSIS — R6889 Other general symptoms and signs: Secondary | ICD-10-CM | POA: Diagnosis not present

## 2015-07-02 DIAGNOSIS — R7309 Other abnormal glucose: Secondary | ICD-10-CM

## 2015-07-02 DIAGNOSIS — M810 Age-related osteoporosis without current pathological fracture: Secondary | ICD-10-CM

## 2015-07-02 DIAGNOSIS — Z0000 Encounter for general adult medical examination without abnormal findings: Secondary | ICD-10-CM

## 2015-07-02 DIAGNOSIS — R7303 Prediabetes: Secondary | ICD-10-CM

## 2015-07-02 DIAGNOSIS — Z1389 Encounter for screening for other disorder: Secondary | ICD-10-CM

## 2015-07-02 DIAGNOSIS — Z0001 Encounter for general adult medical examination with abnormal findings: Secondary | ICD-10-CM | POA: Diagnosis not present

## 2015-07-02 DIAGNOSIS — Z9181 History of falling: Secondary | ICD-10-CM

## 2015-07-02 DIAGNOSIS — E559 Vitamin D deficiency, unspecified: Secondary | ICD-10-CM

## 2015-07-02 DIAGNOSIS — E039 Hypothyroidism, unspecified: Secondary | ICD-10-CM

## 2015-07-02 DIAGNOSIS — I10 Essential (primary) hypertension: Secondary | ICD-10-CM

## 2015-07-02 DIAGNOSIS — R079 Chest pain, unspecified: Secondary | ICD-10-CM

## 2015-07-02 DIAGNOSIS — E669 Obesity, unspecified: Secondary | ICD-10-CM

## 2015-07-02 DIAGNOSIS — M069 Rheumatoid arthritis, unspecified: Secondary | ICD-10-CM

## 2015-07-02 DIAGNOSIS — Z789 Other specified health status: Secondary | ICD-10-CM

## 2015-07-02 DIAGNOSIS — Z79899 Other long term (current) drug therapy: Secondary | ICD-10-CM

## 2015-07-02 DIAGNOSIS — R1013 Epigastric pain: Secondary | ICD-10-CM

## 2015-07-02 LAB — CBC WITH DIFFERENTIAL/PLATELET
BASOS PCT: 1 % (ref 0–1)
Basophils Absolute: 0 10*3/uL (ref 0.0–0.1)
Eosinophils Absolute: 0.3 10*3/uL (ref 0.0–0.7)
Eosinophils Relative: 6 % — ABNORMAL HIGH (ref 0–5)
HEMATOCRIT: 40 % (ref 36.0–46.0)
HEMOGLOBIN: 13.1 g/dL (ref 12.0–15.0)
Lymphocytes Relative: 35 % (ref 12–46)
Lymphs Abs: 1.6 10*3/uL (ref 0.7–4.0)
MCH: 29.8 pg (ref 26.0–34.0)
MCHC: 32.8 g/dL (ref 30.0–36.0)
MCV: 90.9 fL (ref 78.0–100.0)
MONO ABS: 0.4 10*3/uL (ref 0.1–1.0)
MONOS PCT: 9 % (ref 3–12)
MPV: 13.5 fL — ABNORMAL HIGH (ref 8.6–12.4)
NEUTROS ABS: 2.3 10*3/uL (ref 1.7–7.7)
Neutrophils Relative %: 49 % (ref 43–77)
Platelets: 152 10*3/uL (ref 150–400)
RBC: 4.4 MIL/uL (ref 3.87–5.11)
RDW: 14.9 % (ref 11.5–15.5)
WBC: 4.7 10*3/uL (ref 4.0–10.5)

## 2015-07-02 LAB — HEPATIC FUNCTION PANEL
ALT: 23 U/L (ref 6–29)
AST: 37 U/L — ABNORMAL HIGH (ref 10–35)
Albumin: 4.2 g/dL (ref 3.6–5.1)
Alkaline Phosphatase: 87 U/L (ref 33–130)
Bilirubin, Direct: 0.2 mg/dL (ref <=0.2)
Indirect Bilirubin: 0.6 mg/dL (ref 0.2–1.2)
TOTAL PROTEIN: 6.3 g/dL (ref 6.1–8.1)
Total Bilirubin: 0.8 mg/dL (ref 0.2–1.2)

## 2015-07-02 LAB — BASIC METABOLIC PANEL WITH GFR
BUN: 7 mg/dL (ref 7–25)
CALCIUM: 10.9 mg/dL — AB (ref 8.6–10.4)
CO2: 31 mmol/L (ref 20–31)
CREATININE: 0.93 mg/dL (ref 0.50–0.99)
Chloride: 104 mmol/L (ref 98–110)
GFR, Est African American: 73 mL/min (ref >=60)
GFR, Est Non African American: 63 mL/min (ref >=60)
GLUCOSE: 100 mg/dL — AB (ref 65–99)
Potassium: 3.8 mmol/L (ref 3.5–5.3)
Sodium: 141 mmol/L (ref 135–146)

## 2015-07-02 LAB — TSH: TSH: 2.055 u[IU]/mL (ref 0.350–4.500)

## 2015-07-02 LAB — AMYLASE: AMYLASE: 56 U/L (ref 0–105)

## 2015-07-02 MED ORDER — MELOXICAM 15 MG PO TABS: ORAL_TABLET | ORAL | Status: DC

## 2015-07-02 NOTE — Progress Notes (Signed)
MEDICARE ANNUAL WELLNESS VISIT AND FOLLOW UP  Assessment:   1. HYPERTENSION - CBC with Differential - BASIC METABOLIC PANEL WITH GFR - Hepatic function panel  2. Gastroesophageal reflux disease without esophagitis Get back on PPI for now  3. ARTHRITIS, RHEUMATOID Continue medications  4. Encounter for long-term (current) use of other medications - Magnesium  5. Hyperlipidemia - Lipid panel  6. Other abnormal glucose Discussed general issues about diabetes pathophysiology and management., Educational material distributed., Suggested low cholesterol diet., Encouraged aerobic exercise., Discussed foot care., Reminded to get yearly retinal exam. - Hemoglobin A1c - Insulin, fasting  7. Vitamin D Deficiency - Vit D  25 hydroxy (rtn osteoporosis monitoring)  8. Unspecified hypothyroidism - TSH  9. Atypical CP Nonexertional, no accompaniments, very mechanical versus GERD/pancrease Normal EKG Check labs, get on PPI and mobic daily If worseing CP, or ANY SOB/accompaniments go to ER.    Plan:   During the course of the visit the patient was educated and counseled about appropriate screening and preventive services including:    Pneumococcal vaccine   Influenza vaccine  Td vaccine  Screening electrocardiogram  Screening mammography  Bone densitometry screening  Colorectal cancer screening  Diabetes screening  Glaucoma screening  Nutrition counseling   Advanced directives: given info/requested  Conditions/risks identified: BMI: Discussed weight loss, diet, and increase physical activity.  Increase physical activity: AHA recommends 150 minutes of physical activity a week.  Medications reviewed DEXA- due 2016- started fosamax 04/2013 Diabetes is at goal, ACE/ARB therapy: Yes. RA- on DMARD Urinary Incontinence is not an issue: discussed non pharmacology and pharmacology options.  Fall risk: low- discussed PT, home fall assessment, medications.     Subjective:   [name] is a 69 y.o. female who presents for Medicare Annual Wellness Visit and chest pain.  Date of last medicare wellness visit was 04/29/2014  Her blood pressure has been controlled at home, today their BP is BP: 130/78 mmHg She does not workout, other than the physical therapy that she is doing for her right shoulder. She denies chest pain, shortness of breath, dizziness.  She was having dizziness/syncope but her HCTZ was stopped and she is doing better. She has had right sided chest pain, intermittent x 1 week, pressure anterior pain that goes straight through to her right shoulder blade as stabbing pain, worse with moving, has had fatigue and been very intolerant to cold. Not worse with exertion, she was able to walk 2 miles this AM without the pain, she started to have the pain this AM after cereal. She denies accompaniments like SOB, dizziness, nausea, decreased appetite. She has seen Dr. Stanford Breed in the past and she had normal echo in 2012. She does not have a gallbladder, she was recently taken off her PPI.  She is on cholesterol medication and denies myalgias. Her cholesterol is at goal. The cholesterol last visit was:   Lab Results  Component Value Date   CHOL 173 05/27/2015   HDL 72 05/27/2015   LDLCALC 88 05/27/2015   TRIG 67 05/27/2015   CHOLHDL 2.4 05/27/2015   She has been working on diet and exercise for prediabetes, and denies increased appetite, nausea, paresthesia of the feet, polydipsia and polyuria. Last A1C in the office was:  Lab Results  Component Value Date   HGBA1C 5.4 05/27/2015   Patient is on Vitamin D supplement. Lab Results  Component Value Date   VD25OH 33 05/27/2015   She is on thyroid medication. Her medication was changed last  visit, to 131mcg, 1 pill daily.  Patient denies nervousness, palpitations and weight changes.  Lab Results  Component Value Date   TSH 0.298* 02/19/2015  .  She sees Dr. Trudie Reed regularly and  states that her RA is well controlled on plaquenil and Methotrexate.  She is on fosamax for osteoporosis, has been 5 years.   Names of Other Physician/Practitioners you currently use: 1. [facility]- here for primary care 2. Dr. Gershon Crane, eye doctor, last visit q 6 months Patient Care Team: [name], MD as PCP - General (Internal Medicine) Stark Klein, MD as Consulting Physician (General Surgery) Gavin Pound, MD as Consulting Physician (Rheumatology) Lelon Perla, MD as Consulting Physician (Cardiology) Inda Castle, MD as Consulting Physician (Gastroenterology) Starr Lake, MD as Referring Physician (Orthopedic Surgery)  Medication Review Current Outpatient Prescriptions on File Prior to Visit  Medication Sig Dispense Refill  . alendronate (FOSAMAX) 70 MG tablet TAKE 1 TABLET ONE TIME WEEKLY 12 tablet 3  . ALPRAZolam (XANAX) 1 MG tablet Take 1 tablet (1 mg total) by mouth 2 (two) times daily as needed for anxiety. 180 tablet 1  . Cholecalciferol (VITAMIN D PO) Take 5,000 Units by mouth daily.     . folic acid (FOLVITE) 1 MG tablet Take 2 mg by mouth daily.     . hydroxychloroquine (PLAQUENIL) 200 MG tablet Take by mouth daily. Take 1 tab AM and PM    . levothyroxine (SYNTHROID, LEVOTHROID) 100 MCG tablet Take 100 mcg by mouth daily before breakfast.    . losartan (COZAAR) 50 MG tablet Take 1 tablet daily for BP 90 tablet prn  . methotrexate (RHEUMATREX) 2.5 MG tablet 25 mg once a week.      No current facility-administered medications on file prior to visit.    Current Problems (verified) Patient Active Problem List   Diagnosis Date Noted  . History of artificial joint 03/07/2014  . Hypothyroidism 02/11/2014  . Adiposity 02/11/2014  . Osteoporosis 02/11/2014  . Vitamin D deficiency 01/22/2014  . Prediabetes 01/22/2014  . Medication management 01/22/2014  . Syncope 12/10/2013  . Personal history of colonic polyps  04/18/2013  . GERD (gastroesophageal reflux disease) 02/28/2013  . Hyperlipidemia 10/07/2011  . Essential hypertension 10/21/2008  . Rheumatoid arthritis 10/21/2008    Screening Tests Health Maintenance  Topic Date Due  . Hepatitis C Screening  1946-05-23  . ZOSTAVAX  05/17/2007  . PNA vac Low Risk Adult (2 of 2 - PCV13) 11/08/2013  . INFLUENZA VACCINE  06/09/2015  . MAMMOGRAM  01/08/2016  . COLONOSCOPY  03/08/2018  . TETANUS/TDAP  11/08/2022  . DEXA SCAN  Completed     Immunization History  Administered Date(s) Administered  . Influenza, High Dose Seasonal PF 07/16/2014  . Pneumococcal Polysaccharide-23 11/08/2012  . Td 11/08/2012    Preventative care: Last colonoscopy: 03/2013 Last mammogram: 01/2014 Last pap smear/pelvic exam: remote   DEXA:2014 + osteoporosis put on Fosamax MRI head 2012 normal Echo 2012 normal US RENAL normal 2013  Prior vaccinations: TD or Tdap: 2014  Influenza: 2014 Prevnar 13: DUE Pneumococcal: 2014 Shingles/Zostavax: will check price  Allergies Allergies  Allergen Reactions  . Ace Inhibitors Cough  . Ambien [Zolpidem] Other (See Comments)    Odd Feeling  . Crestor [Rosuvastatin]   . Pravastatin   . Prozac [Fluoxetine Hcl] Other (See Comments)    Decreased libido  . Zoloft [Sertraline Hcl]    Surgical history Past Surgical History  Procedure Laterality Date  . Vaginal  hysterectomy      ovaries not removed  . Carpal tunnel release    . Cystectomy      left breast  . Av fistula repair    . Bladder suspension    . Pilonidal cyst excision    . Trigger finger release    . Bunionectomy    . Ercp  09/29/2011    Procedure: ENDOSCOPIC RETROGRADE CHOLANGIOPANCREATOGRAPHY (ERCP);  Surgeon: Inda Castle, MD;  Location: [facility];  Service: Endoscopy;  Laterality: N/A;  . Tonsillectomy    . Cholecystectomy  09/07/2011  . Tenolysis  10/26/2011    Procedure: TENDON SHEATH RELEASE/TENOLYSIS;  Surgeon: Wynonia Sours, MD;  Location:  [facility];  Service: Orthopedics;  Laterality: Right;  tenosynovectomy removal superficialis slip right index finger  . 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 Enrigue Catena at [facility]   Family history Family History  Problem Relation Age of Onset  . Lung cancer Mother   . Stomach cancer Father   . Heart attack Father     MI at age 39  . Colon cancer Maternal Grandfather   . Heart attack Brother     MI at age 28  . Prostate cancer Paternal Grandfather    Risk Factors: Osteoporosis: postmenopausal estrogen deficiency and dietary calcium and/or vitamin D deficiency History of fracture in the past year: no  Tobacco Social History  Substance Use Topics  . Smoking status: Former Smoker    Quit date: 10/25/1995  . Smokeless tobacco: Never Used     Comment: Maryann Conners [id number]  . Alcohol Use: Yes     Comment: occasional   She does not smoke.  Patient is a former smoker. Are there smokers in your home (other than you)?  No  Alcohol Current alcohol use: social drinker  Caffeine Current caffeine use: coffee 1 /day  Exercise Current exercise: none  Nutrition/Diet Current diet: in general, a "healthy" diet    Cardiac risk factors: advanced age (older than 61 for men, 6 for women), dyslipidemia, hypertension, obesity (BMI >= 30 kg/m2) and sedentary lifestyle.  Depression Screen (Note: if answer to either of the following is "Yes", a more complete depression screening is indicated)   Q1: Over the past two weeks, have you felt down, depressed or hopeless? No  Q2: Over the past two weeks, have you felt little interest or pleasure in doing things? No  Have you lost interest or pleasure in daily life? No  Do you often feel hopeless? No  Do you cry easily over simple problems? No  Activities of Daily Living In your present state of health, do you have any difficulty performing the following activities?:  Driving? No Managing money?  No Feeding yourself?  No Getting from bed to chair? No Climbing a flight of stairs? No Preparing food and eating?: No Bathing or showering? No Getting dressed: No Getting to the toilet? No Using the toilet:No Moving around from place to place: No In the past year have you fallen or had a near fall?:No   Are you sexually active?  No  Do you have more than one partner?  No  Vision Difficulties: No  Hearing Difficulties: No Do you often ask people to speak up or repeat themselves? No Do you experience ringing or noises in your ears? No Do you have difficulty understanding soft or whispered voices? No  Cognition  Do you feel that you have a problem with memory?No  Do you often misplace items? No  Do you feel safe at home?  Yes  Advanced directives Does patient have a [place]? Yes Does patient have a Living Will? Yes   Objective:   Blood pressure 130/78, pulse 72, temperature 97.7 F (36.5 C), resp. rate 16, weight 151 lb (68.493 kg). Body mass index is 27.61 kg/(m^2).  General appearance: alert, no distress, WD/WN,  female Cognitive Testing  Alert? Yes  Normal Appearance?Yes  Oriented to person? No  Place? Yes   Time? Yes  Recall of three objects?  Yes  Can perform simple calculations? Yes  Displays appropriate judgment?Yes  Can read the correct time from a watch face?Yes  HEENT: normocephalic, sclerae anicteric, TMs pearly, nares patent, no discharge or erythema, pharynx normal Oral cavity: MMM, no lesions Neck: supple, no lymphadenopathy, no thyromegaly, no masses Heart: RRR, normal S1, S2, no murmurs Lungs: CTA bilaterally, no wheezes, rhonchi, or rales Abdomen: +bs, soft, + epigastric tenderness, non distended, no masses, no hepatomegaly, no splenomegaly Musculoskeletal: + costochondral tenderness to palpation, no swelling, no obvious deformity Extremities: no edema, no cyanosis, no clubbing Pulses: 2+ symmetric, upper and lower extremities, normal cap  refill Neurological: alert, oriented x 3, CN2-12 intact, strength normal upper extremities and lower extremities, sensation normal throughout, DTRs 2+ throughout, no cerebellar signs, gait normal Psychiatric: normal affect, behavior normal, pleasant  Breast: defer Gyn: defer Rectal: defer  Medicare Attestation I have personally reviewed: The patient's medical and social history Their use of alcohol, tobacco or illicit drugs Their current medications and supplements The patient's functional ability including ADLs,fall risks, home safety risks, cognitive, and hearing and visual impairment Diet and physical activities Evidence for depression or mood disorders  The patient's weight, height, BMI, and visual acuity have been recorded in the chart.  I have made referrals, counseling, and provided education to the patient based on review of the above and I have provided the patient with a written personalized care plan for preventive services.     Vicie Mutters, PA-C   07/02/2015

## 2015-07-02 NOTE — Patient Instructions (Signed)
Costochondritis Costochondritis, sometimes called Tietze syndrome, is a swelling and irritation (inflammation) of the tissue (cartilage) that connects your ribs with your breastbone (sternum). It causes pain in the chest and rib area. Costochondritis usually goes away on its own over time. It can take up to 6 weeks or longer to get better, especially if you are unable to limit your activities. CAUSES  Some cases of costochondritis have no known cause. Possible causes include:  Injury (trauma).  Exercise or activity such as lifting.  Severe coughing. SIGNS AND SYMPTOMS  Pain and tenderness in the chest and rib area.  Pain that gets worse when coughing or taking deep breaths.  Pain that gets worse with specific movements. DIAGNOSIS  Your health care provider will do a physical exam and ask about your symptoms. Chest X-rays or other tests may be done to rule out other problems. TREATMENT  Costochondritis usually goes away on its own over time. Your health care provider may prescribe medicine to help relieve pain. HOME CARE INSTRUCTIONS   Avoid exhausting physical activity. Try not to strain your ribs during normal activity. This would include any activities using chest, abdominal, and side muscles, especially if heavy weights are used.  Apply ice to the affected area for the first 2 days after the pain begins.  Put ice in a plastic bag.  Place a towel between your skin and the bag.  Leave the ice on for 20 minutes, 2-3 times a day.  Only take over-the-counter or prescription medicines as directed by your health care provider. SEEK MEDICAL CARE IF:  You have redness or swelling at the rib joints. These are signs of infection.  Your pain does not go away despite rest or medicine. SEEK IMMEDIATE MEDICAL CARE IF:   Your pain increases or you are very uncomfortable.  You have shortness of breath or difficulty breathing.  You cough up blood.  You have worse chest pains,  sweating, or vomiting.  You have a fever or persistent symptoms for more than 2-3 days.  You have a fever and your symptoms suddenly get worse. MAKE SURE YOU:   Understand these instructions.  Will watch your condition.  Will get help right away if you are not doing well or get worse. Document Released: 08/04/2005 Document Revised: 08/15/2013 Document Reviewed: 05/29/2013 [facility] Patient Information 2015 [place], [place]. This information is not intended to replace advice given to you by your health care provider. Make sure you discuss any questions you have with your health care provider.  GETTING OFF OF PPI's    Nexium/protonix/prilosec/Omeprazole/Dexilant/Aciphex are called PPI's, they are great at healing your stomach but should only be taken for a short period of time.     Recent studies have shown that taken for a long time they  can increase the risk of osteoporosis (weakening of your bones), pneumonia, low magnesium, restless legs, Cdiff (infection that causes diarrhea), DEMENTIA and most recently kidney damage / disease / insufficiency.     Due to this information we want to try to stop the PPI but if you try to stop it abruptly this can cause rebound acid and worsening symptoms.   So this is how we want you to get off the PPI:  - Start taking the nexium/protonix/prilosec/PPI  every other day with  zantac (ranitidine) 2 x a day for 2-4 weeks  - then decrease the PPI to every 3 days while taking the zantac (ranitidine) twice a day the other  days for 2-4  Weeks  -  then you can try the zantac (ranitidine) once at night or up to 2 x day as needed.  - you can continue on this once at night or stop all together  - Avoid alcohol, spicy foods, NSAIDS (aleve, ibuprofen) at this time. See foods below.   +++++++++++++++++++++++++++++++++++++++++++  Food Choices for Gastroesophageal Reflux Disease  When you have gastroesophageal reflux disease (GERD), the foods you eat and your  eating habits are very important. Choosing the right foods can help ease the discomfort of GERD. WHAT GENERAL GUIDELINES DO I NEED TO FOLLOW?  Choose fruits, vegetables, whole grains, low-fat dairy products, and low-fat meat, fish, and poultry.  Limit fats such as oils, salad dressings, butter, nuts, and avocado.  Keep a food diary to identify foods that cause symptoms.  Avoid foods that cause reflux. These may be different for different people.  Eat frequent small meals instead of three large meals each day.  Eat your meals slowly, in a relaxed setting.  Limit fried foods.  Cook foods using methods other than frying.  Avoid drinking alcohol.  Avoid drinking large amounts of liquids with your meals.  Avoid bending over or lying down until 2-3 hours after eating.   WHAT FOODS ARE NOT RECOMMENDED? The following are some foods and drinks that may worsen your symptoms:  Vegetables Tomatoes. Tomato juice. Tomato and spaghetti sauce. Chili peppers. Onion and garlic. Horseradish. Fruits Oranges, grapefruit, and lemon (fruit and juice). Meats High-fat meats, fish, and poultry. This includes hot dogs, ribs, ham, sausage, salami, and bacon. Dairy Whole milk and chocolate milk. Sour cream. Cream. Butter. Ice cream. Cream cheese.  Beverages Coffee and tea, with or without caffeine. Carbonated beverages or energy drinks. Condiments Hot sauce. Barbecue sauce.  Sweets/Desserts Chocolate and cocoa. Donuts. Peppermint and spearmint. Fats and Oils High-fat foods, including [place] fries and potato chips. Other Vinegar. Strong spices, such as black pepper, white pepper, red pepper, cayenne, curry powder, cloves, ginger, and chili powder. Nexium/protonix/prilosec are called PPI's, they are great at healing your stomach but should only be taken for a short period of time.   Chest Pain (Nonspecific) It is often hard to give a specific diagnosis for the cause of chest pain. There is always  a chance that your pain could be related to something serious, such as a heart attack or a blood clot in the lungs. You need to follow up with your health care provider for further evaluation. CAUSES   Heartburn.  Pneumonia or bronchitis.  Anxiety or stress.  Inflammation around your heart (pericarditis) or lung (pleuritis or pleurisy).  A blood clot in the lung.  A collapsed lung (pneumothorax). It can develop suddenly on its own (spontaneous pneumothorax) or from trauma to the chest.  Shingles infection (herpes zoster virus). The chest wall is composed of bones, muscles, and cartilage. Any of these can be the source of the pain.  The bones can be bruised by injury.  The muscles or cartilage can be strained by coughing or overwork.  The cartilage can be affected by inflammation and become sore (costochondritis). DIAGNOSIS  Lab tests or other studies may be needed to find the cause of your pain. Your health care provider may have you take a test called an ambulatory electrocardiogram (ECG). An ECG records your heartbeat patterns over a 24-hour period. You may also have other tests, such as:  Transthoracic echocardiogram (TTE). During echocardiography, sound waves are used to evaluate how blood flows through your heart.  Transesophageal echocardiogram (TEE).  Cardiac monitoring. This allows your health care provider to monitor your heart rate and rhythm in real time.  Holter monitor. This is a portable device that records your heartbeat and can help diagnose heart arrhythmias. It allows your health care provider to track your heart activity for several days, if needed.  Stress tests by exercise or by giving medicine that makes the heart beat faster. TREATMENT   Treatment depends on what may be causing your chest pain. Treatment may include:  Acid blockers for heartburn.  Anti-inflammatory medicine.  Pain medicine for inflammatory conditions.  Antibiotics if an infection is  present.  You may be advised to change lifestyle habits. This includes stopping smoking and avoiding alcohol, caffeine, and chocolate.  You may be advised to keep your head raised (elevated) when sleeping. This reduces the chance of acid going backward from your stomach into your esophagus. Most of the time, nonspecific chest pain will improve within 2-3 days with rest and mild pain medicine.  HOME CARE INSTRUCTIONS   If antibiotics were prescribed, take them as directed. Finish them even if you start to feel better.  For the next few days, avoid physical activities that bring on chest pain. Continue physical activities as directed.  Do not use any tobacco products, including cigarettes, chewing tobacco, or electronic cigarettes.  Avoid drinking alcohol.  Only take medicine as directed by your health care provider.  Follow your health care provider's suggestions for further testing if your chest pain does not go away.  Keep any follow-up appointments you made. If you do not go to an appointment, you could develop lasting (chronic) problems with pain. If there is any problem keeping an appointment, call to reschedule. SEEK MEDICAL CARE IF:   Your chest pain does not go away, even after treatment.  You have a rash with blisters on your chest.  You have a fever. SEEK IMMEDIATE MEDICAL CARE IF:   You have increased chest pain or pain that spreads to your arm, neck, jaw, back, or abdomen.  You have shortness of breath.  You have an increasing cough, or you cough up blood.  You have severe back or abdominal pain.  You feel nauseous or vomit.  You have severe weakness.  You faint.  You have chills. This is an emergency. Do not wait to see if the pain will go away. Get medical help at once. Call your local emergency services (911 in [place] Do not drive yourself to the hospital. MAKE SURE YOU:   Understand these instructions.  Will watch your condition.  Will get help right  away if you are not doing well or get worse. Document Released: 08/04/2005 Document Revised: 10/30/2013 Document Reviewed: 05/30/2008 [facility] Patient Information 2015 [place], [place]. This information is not intended to replace advice given to you by your health care provider. Make sure you discuss any questions you have with your health care provider.

## 2015-07-05 NOTE — Addendum Note (Signed)
Addended by: Cobi Aldape A on: 07/05/2015 05:18 PM   Modules accepted: Orders

## 2015-07-18 ENCOUNTER — Encounter: Payer: Self-pay | Admitting: Physician Assistant

## 2015-07-18 ENCOUNTER — Ambulatory Visit (INDEPENDENT_AMBULATORY_CARE_PROVIDER_SITE_OTHER): Payer: Commercial Managed Care - HMO | Admitting: Physician Assistant

## 2015-07-18 VITALS — BP 140/60 | HR 66 | Temp 97.7°F | Resp 16 | Ht 62.0 in | Wt 154.6 lb

## 2015-07-18 DIAGNOSIS — R7989 Other specified abnormal findings of blood chemistry: Secondary | ICD-10-CM | POA: Diagnosis not present

## 2015-07-18 DIAGNOSIS — E349 Endocrine disorder, unspecified: Secondary | ICD-10-CM

## 2015-07-18 DIAGNOSIS — R079 Chest pain, unspecified: Secondary | ICD-10-CM | POA: Diagnosis not present

## 2015-07-18 DIAGNOSIS — R5383 Other fatigue: Secondary | ICD-10-CM | POA: Diagnosis not present

## 2015-07-18 DIAGNOSIS — R945 Abnormal results of liver function studies: Secondary | ICD-10-CM

## 2015-07-18 NOTE — Patient Instructions (Signed)
Add ENTERIC COATED low dose 81 mg Aspirin daily OR can do every other day if you have easy bruising to protect your heart and head. As well as to reduce risk of Colon Cancer by 20 %, Skin Cancer by 26 % , Melanoma by 46% and Pancreatic cancer by 60%  Chest Pain (Nonspecific) It is often hard to give a specific diagnosis for the cause of chest pain. There is always a chance that your pain could be related to something serious, such as a heart attack or a blood clot in the lungs. You need to follow up with your health care provider for further evaluation. CAUSES   Heartburn.  Pneumonia or bronchitis.  Anxiety or stress.  Inflammation around your heart (pericarditis) or lung (pleuritis or pleurisy).  A blood clot in the lung.  A collapsed lung (pneumothorax). It can develop suddenly on its own (spontaneous pneumothorax) or from trauma to the chest.  Shingles infection (herpes zoster virus). The chest wall is composed of bones, muscles, and cartilage. Any of these can be the source of the pain.  The bones can be bruised by injury.  The muscles or cartilage can be strained by coughing or overwork.  The cartilage can be affected by inflammation and become sore (costochondritis). DIAGNOSIS  Lab tests or other studies may be needed to find the cause of your pain. Your health care provider may have you take a test called an ambulatory electrocardiogram (ECG). An ECG records your heartbeat patterns over a 24-hour period. You may also have other tests, such as:  Transthoracic echocardiogram (TTE). During echocardiography, sound waves are used to evaluate how blood flows through your heart.  Transesophageal echocardiogram (TEE).  Cardiac monitoring. This allows your health care provider to monitor your heart rate and rhythm in real time.  Holter monitor. This is a portable device that records your heartbeat and can help diagnose heart arrhythmias. It allows your health care provider to track  your heart activity for several days, if needed.  Stress tests by exercise or by giving medicine that makes the heart beat faster. TREATMENT   Treatment depends on what may be causing your chest pain. Treatment may include:  Acid blockers for heartburn.  Anti-inflammatory medicine.  Pain medicine for inflammatory conditions.  Antibiotics if an infection is present.  You may be advised to change lifestyle habits. This includes stopping smoking and avoiding alcohol, caffeine, and chocolate.  You may be advised to keep your head raised (elevated) when sleeping. This reduces the chance of acid going backward from your stomach into your esophagus. Most of the time, nonspecific chest pain will improve within 2-3 days with rest and mild pain medicine.  HOME CARE INSTRUCTIONS   If antibiotics were prescribed, take them as directed. Finish them even if you start to feel better.  For the next few days, avoid physical activities that bring on chest pain. Continue physical activities as directed.  Do not use any tobacco products, including cigarettes, chewing tobacco, or electronic cigarettes.  Avoid drinking alcohol.  Only take medicine as directed by your health care provider.  Follow your health care provider's suggestions for further testing if your chest pain does not go away.  Keep any follow-up appointments you made. If you do not go to an appointment, you could develop lasting (chronic) problems with pain. If there is any problem keeping an appointment, call to reschedule. SEEK MEDICAL CARE IF:   Your chest pain does not go away, even after treatment.  You have a rash with blisters on your chest.  You have a fever. SEEK IMMEDIATE MEDICAL CARE IF:   You have increased chest pain or pain that spreads to your arm, neck, jaw, back, or abdomen.  You have shortness of breath.  You have an increasing cough, or you cough up blood.  You have severe back or abdominal pain.  You  feel nauseous or vomit.  You have severe weakness.  You faint.  You have chills. This is an emergency. Do not wait to see if the pain will go away. Get medical help at once. Call your local emergency services (911 in [place] Do not drive yourself to the hospital. MAKE SURE YOU:   Understand these instructions.  Will watch your condition.  Will get help right away if you are not doing well or get worse. Document Released: 08/04/2005 Document Revised: 10/30/2013 Document Reviewed: 05/30/2008 [facility] Patient Information 2015 [place], [place]. This information is not intended to replace advice given to you by your health care provider. Make sure you discuss any questions you have with your health care provider.

## 2015-07-18 NOTE — Progress Notes (Signed)
Assessment and Plan: 69 year old female HTN, chol, RA presents with exertional fatigue and chest discomfort x 2 weeks, no active chest pain,with diffuse t wave inversion on EKG some unchanged, some new, LAE, suggest follow up Dr. Stanford Breed due to concerning story with exertion, add bASA, go to ER if any worsening CP, SOB, dizziness, fatigue.    HPI 69 y.o.female with history of chol, hypothyroid, anemia, GERD, RA, HTN presents for follow up of chest pain. She states it has improved but she has become more fatigued, she is not walking anymore due to fatigue, SOB, and a flutter/odd sensation in her center chest. She will come back from walking and go to bed. She will have arm weakness with it, will be better with rest. She admits these symptoms are different from the last time, and that improved with the PPI. Has seen Dr. Stanford Breed in the past, normal echo 2012.   She did have abnormal labs, elevated calcium with get PTH, and abnormal LFTs. She will have hot flashes occ.   Past Medical History  Diagnosis Date  . Hyperlipidemia   . Hypothyroid   . Anemia     Hx of   . H. pylori infection     Hx of   . GERD (gastroesophageal reflux disease)   . PUD (peptic ulcer disease)   . Calculus of bile duct without mention of cholecystitis or obstruction   . Arthritis   . Rheumatoid arthritis(714.0)   . Hypertension   . Cataract   . Heart murmur      Allergies  Allergen Reactions  . Ace Inhibitors Cough  . Ambien [Zolpidem] Other (See Comments)    Odd Feeling  . Crestor [Rosuvastatin]   . Pravastatin   . Prozac [Fluoxetine Hcl] Other (See Comments)    Decreased libido  . Zoloft [Sertraline Hcl]       Current Outpatient Prescriptions on File Prior to Visit  Medication Sig Dispense Refill  . ALPRAZolam (XANAX) 1 MG tablet Take 1 tablet (1 mg total) by mouth 2 (two) times daily as needed for anxiety. 180 tablet 1  . Cholecalciferol (VITAMIN D PO) Take 5,000 Units by mouth daily.     . folic  acid (FOLVITE) 1 MG tablet Take 2 mg by mouth daily.     . hydroxychloroquine (PLAQUENIL) 200 MG tablet Take by mouth daily. Take 1 tab AM and PM    . levothyroxine (SYNTHROID, LEVOTHROID) 100 MCG tablet Take 100 mcg by mouth daily before breakfast.    . losartan (COZAAR) 50 MG tablet Take 1 tablet daily for BP 90 tablet prn  . meloxicam (MOBIC) 15 MG tablet Take one daily with food for 2 weeks, can take with tylenol, can not take with aleve, iburpofen, then as needed daily for pain 30 tablet 1  . methotrexate (RHEUMATREX) 2.5 MG tablet 25 mg once a week.      No current facility-administered medications on file prior to visit.    ROS: all negative except above.   Physical Exam: Filed Weights   07/18/15 0911  Weight: 154 lb 9.6 oz (70.126 kg)   BP 140/60 mmHg  Pulse 66  Temp(Src) 97.7 F (36.5 C)  Resp 16  Ht 5\' 2"  (1.575 m)  Wt 154 lb 9.6 oz (70.126 kg)  BMI 28.27 kg/m2 General Appearance: Well nourished, in no apparent distress. Eyes: PERRLA, EOMs, conjunctiva no swelling or erythema Sinuses: No Frontal/maxillary tenderness ENT/Mouth: Ext aud canals clear, TMs without erythema, bulging. No erythema, swelling,  or exudate on post pharynx.  Tonsils not swollen or erythematous. Hearing normal.  Neck: Supple, thyroid normal.  Respiratory: Respiratory effort normal, BS equal bilaterally without rales, rhonchi, wheezing or stridor.  Cardio: RRR with systolic murmur RSB, occ PVCs.  Brisk peripheral pulses without edema.  Abdomen: Soft, + BS.  Non tender, no guarding, rebound, hernias, masses. Lymphatics: Non tender without lymphadenopathy.  Musculoskeletal: Full ROM, 5/5 strength, normal gait.  Skin: Warm, dry without rashes, lesions, ecchymosis.  Neuro: Cranial nerves intact. Normal muscle tone, no cerebellar symptoms. Sensation intact.  Psych: Awake and oriented X 3, normal affect, Insight and Judgment appropriate.     Vicie Mutters, PA-C 9:13 AM [facility]  Internal Medicine

## 2015-07-19 LAB — CBC WITH DIFFERENTIAL/PLATELET
BASOS PCT: 1 % (ref 0–1)
Basophils Absolute: 0 10*3/uL (ref 0.0–0.1)
EOS ABS: 0.3 10*3/uL (ref 0.0–0.7)
Eosinophils Relative: 7 % — ABNORMAL HIGH (ref 0–5)
HCT: 39.2 % (ref 36.0–46.0)
Hemoglobin: 13.3 g/dL (ref 12.0–15.0)
LYMPHS ABS: 1.7 10*3/uL (ref 0.7–4.0)
Lymphocytes Relative: 36 % (ref 12–46)
MCH: 30.4 pg (ref 26.0–34.0)
MCHC: 33.9 g/dL (ref 30.0–36.0)
MCV: 89.5 fL (ref 78.0–100.0)
MONO ABS: 0.5 10*3/uL (ref 0.1–1.0)
MONOS PCT: 11 % (ref 3–12)
MPV: 12 fL (ref 8.6–12.4)
Neutro Abs: 2.1 10*3/uL (ref 1.7–7.7)
Neutrophils Relative %: 45 % (ref 43–77)
PLATELETS: 153 10*3/uL (ref 150–400)
RBC: 4.38 MIL/uL (ref 3.87–5.11)
RDW: 15.6 % — ABNORMAL HIGH (ref 11.5–15.5)
WBC: 4.7 10*3/uL (ref 4.0–10.5)

## 2015-07-19 LAB — HEPATIC FUNCTION PANEL
ALBUMIN: 4.1 g/dL (ref 3.6–5.1)
ALK PHOS: 100 U/L (ref 33–130)
ALT: 19 U/L (ref 6–29)
AST: 30 U/L (ref 10–35)
BILIRUBIN TOTAL: 0.7 mg/dL (ref 0.2–1.2)
Bilirubin, Direct: 0.1 mg/dL (ref <=0.2)
Indirect Bilirubin: 0.6 mg/dL (ref 0.2–1.2)
TOTAL PROTEIN: 6.1 g/dL (ref 6.1–8.1)

## 2015-07-19 LAB — TROPONIN I: Troponin I: 0.04 ng/mL (ref <0.06)

## 2015-07-21 ENCOUNTER — Ambulatory Visit (INDEPENDENT_AMBULATORY_CARE_PROVIDER_SITE_OTHER): Payer: Commercial Managed Care - HMO | Admitting: Cardiology

## 2015-07-21 ENCOUNTER — Encounter: Payer: Self-pay | Admitting: Cardiology

## 2015-07-21 ENCOUNTER — Encounter: Payer: Self-pay | Admitting: *Deleted

## 2015-07-21 VITALS — BP 150/76 | HR 50 | Ht 62.0 in | Wt 156.2 lb

## 2015-07-21 DIAGNOSIS — R072 Precordial pain: Secondary | ICD-10-CM

## 2015-07-21 DIAGNOSIS — R55 Syncope and collapse: Secondary | ICD-10-CM

## 2015-07-21 DIAGNOSIS — E785 Hyperlipidemia, unspecified: Secondary | ICD-10-CM

## 2015-07-21 DIAGNOSIS — R079 Chest pain, unspecified: Secondary | ICD-10-CM | POA: Diagnosis not present

## 2015-07-21 DIAGNOSIS — I10 Essential (primary) hypertension: Secondary | ICD-10-CM

## 2015-07-21 LAB — PTH, INTACT AND CALCIUM
Calcium: 10.4 mg/dL (ref 8.4–10.5)
PTH: 129 pg/mL — AB (ref 14–64)

## 2015-07-21 NOTE — Assessment & Plan Note (Signed)
Blood pressure is mildly elevated. I'm hesitant to increase her medications as she has had problems with orthostatic hypotension with syncope previously. We will follow for now.

## 2015-07-21 NOTE — Patient Instructions (Signed)
Your physician wants you to follow-up in: ONE YEAR WITH DR CRENSHAW You will receive a reminder letter in the mail two months in advance. If you don't receive a letter, please call our office to schedule the follow-up appointment.   Your physician has requested that you have a lexiscan myoview. For further information please visit [email or web address]. Please follow instruction sheet, as given.   

## 2015-07-21 NOTE — Assessment & Plan Note (Signed)
Management per primary care. 

## 2015-07-21 NOTE — Progress Notes (Signed)
HPI: FU syncope. Echocardiogram in December of 2012 showed normal LV function, grade 1 diastolic dysfunction and mild mitral regurgitation. Renal Dopplers in November of 2013 showed no renal artery stenosis. Holter monitor in December of 2014 showed sinus rhythm with occasional PACs. Laboratories in December of 2014 showed normal hemoglobin, potassium and TSH. Over the past several years she has had dizzy spells and occasional syncope. Each episode occurs after standing up from the lying or sitting position. HCTZ DCed and I asked her to increase fluid/Na intake. Symptoms improved. Patient seen recently for chest pain. Troponin on September 9 negative. since I last saw her, she does have some chest pain. In the past 6 weeks she has noticed pain in the substernal area without radiation. The pain occurs both at rest and with exertion. Longest duration is several minutes. No associated symptoms. Not pleuritic, positional or related to food. Denies dyspnea. She continues to have some dizziness with standing but no syncope.  Current Outpatient Prescriptions  Medication Sig Dispense Refill  . ALPRAZolam (XANAX) 1 MG tablet Take 1 tablet (1 mg total) by mouth 2 (two) times daily as needed for anxiety. 180 tablet 1  . aspirin 81 MG tablet Take 81 mg by mouth daily.    . Cholecalciferol (VITAMIN D PO) Take 5,000 Units by mouth daily.     . folic acid (FOLVITE) 1 MG tablet Take 2 mg by mouth daily.     . hydroxychloroquine (PLAQUENIL) 200 MG tablet Take by mouth daily. Take 1 tab AM and PM    . levothyroxine (SYNTHROID, LEVOTHROID) 100 MCG tablet Take 100 mcg by mouth daily before breakfast.    . losartan (COZAAR) 50 MG tablet Take 1 tablet daily for BP 90 tablet prn  . meloxicam (MOBIC) 15 MG tablet Take one daily with food for 2 weeks, can take with tylenol, can not take with aleve, iburpofen, then as needed daily for pain 30 tablet 1  . methotrexate (RHEUMATREX) 2.5 MG tablet 25 mg once a week.       No current facility-administered medications for this visit.     Past Medical History  Diagnosis Date  . Hyperlipidemia   . Hypothyroid   . Anemia     Hx of   . H. pylori infection     Hx of   . GERD (gastroesophageal reflux disease)   . PUD (peptic ulcer disease)   . Calculus of bile duct without mention of cholecystitis or obstruction   . Arthritis   . Rheumatoid arthritis(714.0)   . Hypertension   . Cataract   . Heart murmur     Past Surgical History  Procedure Laterality Date  . Vaginal hysterectomy      ovaries not removed  . Carpal tunnel release    . Cystectomy      left breast  . Av fistula repair    . Bladder suspension    . Pilonidal cyst excision    . Trigger finger release    . Bunionectomy    . Ercp  09/29/2011    Procedure: ENDOSCOPIC RETROGRADE CHOLANGIOPANCREATOGRAPHY (ERCP);  Surgeon: Inda Castle, MD;  Location: [facility];  Service: Endoscopy;  Laterality: N/A;  . Tonsillectomy    . Cholecystectomy  09/07/2011  . Tenolysis  10/26/2011    Procedure: TENDON SHEATH RELEASE/TENOLYSIS;  Surgeon: Wynonia Sours, MD;  Location: [place];  Service: Orthopedics;  Laterality: Right;  tenosynovectomy removal superficialis slip right index finger  .  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 Enrigue Catena at [place]  . Marital Status: Married    Spouse Name: N/A  . Number of Children: 2  . Years of Education: N/A   Occupational History  . retired     Retired   Social History Main Topics  . Smoking status: Former Smoker    Quit date: 10/25/1995  . Smokeless tobacco: Never Used     Comment: Maryann Conners [id number]  . Alcohol Use: 0.0 oz/week    0 Standard drinks or equivalent per week     Comment: occasional  . Drug Use: No  . Sexual Activity: Yes    Birth Control/ Protection: Post-menopausal   Other Topics Concern  . Not on file   Social History Narrative    ROS: no fevers or chills,  productive cough, hemoptysis, dysphasia, odynophagia, melena, hematochezia, dysuria, hematuria, rash, seizure activity, orthopnea, PND, pedal edema, claudication. Remaining systems are negative.  Physical Exam: Well-developed well-nourished in no acute distress.  Skin is warm and dry.  HEENT is normal.  Neck is supple.  Chest is clear to auscultation with normal expansion.  Cardiovascular exam is regular rate and rhythm.  Abdominal exam nontender or distended. No masses palpated. Extremities show no edema. neuro grossly intact  ECG sinus rhythm, inferior lateral T-wave inversion.

## 2015-07-21 NOTE — Assessment & Plan Note (Signed)
No recurrent episodes. We again discussed the importance of maintaining hydration and salt intake.

## 2015-07-21 NOTE — Assessment & Plan Note (Signed)
Symptoms are atypical. Electrocardiogram is unchanged compared to previous. Plan Lexiscan nuclear study for risk stratification.

## 2015-07-21 NOTE — Addendum Note (Signed)
Addended by: Vicie Mutters R on: 07/21/2015 01:27 PM   Modules accepted: Orders

## 2015-07-22 ENCOUNTER — Other Ambulatory Visit: Payer: Self-pay

## 2015-07-22 ENCOUNTER — Encounter: Payer: Self-pay | Admitting: Physician Assistant

## 2015-07-22 ENCOUNTER — Encounter: Payer: Self-pay | Admitting: Cardiology

## 2015-07-22 DIAGNOSIS — E349 Endocrine disorder, unspecified: Secondary | ICD-10-CM

## 2015-07-24 ENCOUNTER — Telehealth (HOSPITAL_COMMUNITY): Payer: Self-pay

## 2015-07-24 NOTE — Telephone Encounter (Signed)
Encounter complete. 

## 2015-07-25 ENCOUNTER — Encounter: Payer: Self-pay | Admitting: Physician Assistant

## 2015-07-25 DIAGNOSIS — E213 Hyperparathyroidism, unspecified: Secondary | ICD-10-CM | POA: Insufficient documentation

## 2015-07-25 LAB — CALCIUM, URINE, 24 HOUR
CALCIUM 24HR UR: 126 mg/d (ref 100–250)
CALCIUM UR: 4 mg/dL

## 2015-07-29 ENCOUNTER — Ambulatory Visit (HOSPITAL_COMMUNITY)
Admission: RE | Admit: 2015-07-29 | Discharge: 2015-07-29 | Disposition: A | Discharge status: Home / Self Care | Payer: Commercial Managed Care - HMO | Source: Ambulatory Visit | Attending: Cardiovascular Disease | Admitting: Cardiovascular Disease

## 2015-07-29 DIAGNOSIS — R079 Chest pain, unspecified: Secondary | ICD-10-CM | POA: Insufficient documentation

## 2015-07-29 DIAGNOSIS — E785 Hyperlipidemia, unspecified: Secondary | ICD-10-CM | POA: Insufficient documentation

## 2015-07-29 DIAGNOSIS — I10 Essential (primary) hypertension: Secondary | ICD-10-CM | POA: Insufficient documentation

## 2015-07-29 DIAGNOSIS — Z8249 Family history of ischemic heart disease and other diseases of the circulatory system: Secondary | ICD-10-CM | POA: Diagnosis not present

## 2015-07-29 DIAGNOSIS — R5383 Other fatigue: Secondary | ICD-10-CM | POA: Insufficient documentation

## 2015-07-29 DIAGNOSIS — R55 Syncope and collapse: Secondary | ICD-10-CM | POA: Diagnosis not present

## 2015-07-29 DIAGNOSIS — F17200 Nicotine dependence, unspecified, uncomplicated: Secondary | ICD-10-CM | POA: Insufficient documentation

## 2015-07-29 DIAGNOSIS — R42 Dizziness and giddiness: Secondary | ICD-10-CM | POA: Diagnosis not present

## 2015-07-29 LAB — MYOCARDIAL PERFUSION IMAGING
CHL CUP NUCLEAR SRS: 1
CHL CUP NUCLEAR SSS: 1
LV sys vol: 30 mL
LVDIAVOL: 82 mL
NUC STRESS TID: 1.26
Peak HR: 86 {beats}/min
Rest HR: 51 {beats}/min
SDS: 0

## 2015-07-29 MED ORDER — TECHNETIUM TC 99M SESTAMIBI GENERIC - CARDIOLITE
30.8000 | Freq: Once | INTRAVENOUS | Status: AC | PRN
  Administered 2015-07-29: 30.8 via INTRAVENOUS

## 2015-07-29 MED ORDER — TECHNETIUM TC 99M SESTAMIBI GENERIC - CARDIOLITE
10.7000 | Freq: Once | INTRAVENOUS | Status: AC | PRN
  Administered 2015-07-29: 11 via INTRAVENOUS

## 2015-07-29 MED ORDER — REGADENOSON 0.4 MG/5ML IV SOLN
0.4000 mg | Freq: Once | INTRAVENOUS | Status: AC
  Administered 2015-07-29: 0.4 mg via INTRAVENOUS

## 2015-09-17 ENCOUNTER — Encounter: Payer: Self-pay | Admitting: Internal Medicine

## 2015-09-17 ENCOUNTER — Ambulatory Visit (INDEPENDENT_AMBULATORY_CARE_PROVIDER_SITE_OTHER): Payer: Commercial Managed Care - HMO | Admitting: Internal Medicine

## 2015-09-17 VITALS — BP 142/76 | HR 68 | Temp 97.7°F | Resp 16 | Ht 62.0 in | Wt 148.8 lb

## 2015-09-17 DIAGNOSIS — I10 Essential (primary) hypertension: Secondary | ICD-10-CM

## 2015-09-17 DIAGNOSIS — Z6827 Body mass index (BMI) 27.0-27.9, adult: Secondary | ICD-10-CM

## 2015-09-17 DIAGNOSIS — E663 Overweight: Secondary | ICD-10-CM | POA: Insufficient documentation

## 2015-09-17 DIAGNOSIS — E785 Hyperlipidemia, unspecified: Secondary | ICD-10-CM

## 2015-09-17 DIAGNOSIS — R7303 Prediabetes: Secondary | ICD-10-CM

## 2015-09-17 DIAGNOSIS — Z79899 Other long term (current) drug therapy: Secondary | ICD-10-CM

## 2015-09-17 DIAGNOSIS — Z23 Encounter for immunization: Secondary | ICD-10-CM

## 2015-09-17 DIAGNOSIS — E559 Vitamin D deficiency, unspecified: Secondary | ICD-10-CM | POA: Diagnosis not present

## 2015-09-17 DIAGNOSIS — M459 Ankylosing spondylitis of unspecified sites in spine: Secondary | ICD-10-CM | POA: Diagnosis not present

## 2015-09-17 LAB — BASIC METABOLIC PANEL WITH GFR
BUN: 6 mg/dL — ABNORMAL LOW (ref 7–25)
CHLORIDE: 105 mmol/L (ref 98–110)
CO2: 27 mmol/L (ref 20–31)
CREATININE: 0.87 mg/dL (ref 0.50–0.99)
Calcium: 10.3 mg/dL (ref 8.6–10.4)
GFR, EST NON AFRICAN AMERICAN: 69 mL/min (ref >=60)
GFR, Est African American: 79 mL/min (ref >=60)
Glucose, Bld: 87 mg/dL (ref 65–99)
POTASSIUM: 4.1 mmol/L (ref 3.5–5.3)
SODIUM: 143 mmol/L (ref 135–146)

## 2015-09-17 LAB — HEPATIC FUNCTION PANEL
ALK PHOS: 100 U/L (ref 33–130)
ALT: 20 U/L (ref 6–29)
AST: 35 U/L (ref 10–35)
Albumin: 4 g/dL (ref 3.6–5.1)
BILIRUBIN DIRECT: 0.1 mg/dL (ref <=0.2)
BILIRUBIN INDIRECT: 0.6 mg/dL (ref 0.2–1.2)
BILIRUBIN TOTAL: 0.7 mg/dL (ref 0.2–1.2)
Total Protein: 6.2 g/dL (ref 6.1–8.1)

## 2015-09-17 LAB — LIPID PANEL
CHOL/HDL RATIO: 2 ratio (ref <=5.0)
Cholesterol: 185 mg/dL (ref 125–200)
HDL: 91 mg/dL (ref >=46)
LDL Cholesterol: 82 mg/dL (ref <130)
Triglycerides: 59 mg/dL (ref <150)
VLDL: 12 mg/dL (ref <30)

## 2015-09-17 LAB — CBC WITH DIFFERENTIAL/PLATELET
BASOS ABS: 0 10*3/uL (ref 0.0–0.1)
BASOS PCT: 1 % (ref 0–1)
EOS ABS: 0.4 10*3/uL (ref 0.0–0.7)
Eosinophils Relative: 8 % — ABNORMAL HIGH (ref 0–5)
HCT: 37.9 % (ref 36.0–46.0)
HEMOGLOBIN: 12.5 g/dL (ref 12.0–15.0)
Lymphocytes Relative: 39 % (ref 12–46)
Lymphs Abs: 1.7 10*3/uL (ref 0.7–4.0)
MCH: 29.8 pg (ref 26.0–34.0)
MCHC: 33 g/dL (ref 30.0–36.0)
MCV: 90.2 fL (ref 78.0–100.0)
MPV: 13.2 fL — AB (ref 8.6–12.4)
Monocytes Absolute: 0.4 10*3/uL (ref 0.1–1.0)
Monocytes Relative: 10 % (ref 3–12)
NEUTROS ABS: 1.8 10*3/uL (ref 1.7–7.7)
NEUTROS PCT: 42 % — AB (ref 43–77)
PLATELETS: 196 10*3/uL (ref 150–400)
RBC: 4.2 MIL/uL (ref 3.87–5.11)
RDW: 14.6 % (ref 11.5–15.5)
WBC: 4.4 10*3/uL (ref 4.0–10.5)

## 2015-09-17 LAB — MAGNESIUM: MAGNESIUM: 1.9 mg/dL (ref 1.5–2.5)

## 2015-09-17 LAB — HEMOGLOBIN A1C
HEMOGLOBIN A1C: 5.3 % (ref <5.7)
Mean Plasma Glucose: 105 mg/dL (ref <117)

## 2015-09-17 LAB — TSH: TSH: 7.836 u[IU]/mL — AB (ref 0.350–4.500)

## 2015-09-17 NOTE — Progress Notes (Signed)
Patient ID: [name], female   DOB: [birth year], 69 y.o.   MRN: [id number]     This very nice 69 y.o. MWF presents for 6 month follow up with Hypertension, Hyperlipidemia, Pre-Diabetes, Hypothyroidism  and Vitamin D Deficiency. Patient has hx/o Rh Arthritis since 1989 and most recently is followed by Dr Army Melia. Pt continues to c/o pains in her wrists & hands.      Patient is treated for HTN since 1997  & BP has been controlled at home. Today's BP is 142/76. In Sept patient had a negative Stress  Myocardial scan by Dr Stanford Breed. Patient has had no complaints of any cardiac type chest pain, palpitations, dyspnea/orthopnea/PND, dizziness, claudication, or dependent edema.     Hyperlipidemia is controlled with diet. Last Lipids were at goal with Cholesterol 173; HDL 72; LDL 88; Triglycerides 67 on 05/27/2015.     Also, the patient has history of PreDiabetes with A1c 5.8% in 2011 and has had no symptoms of reactive hypoglycemia, diabetic polys, paresthesias or visual blurring.  Last A1c was 5.4% on 05/27/2015.     Further, the patient also has history of Vitamin D Deficiency of 38 in 2008 and supplements vitamin D without any suspected side-effects. Last vitamin D was 70 on 05/27/2015.  Medication Sig  . ALPRAZolam (XANAX) 1 MG tablet Take 1 tablet (1 mg total) by mouth 2 (two) times daily as needed for anxiety.  Marland Kitchen aspirin 81 MG tablet Take 81 mg by mouth daily.  . Cholecalciferol (VITAMIN D PO) Take 5,000 Units by mouth daily.   . folic acid (FOLVITE) 1 MG tablet Take 2 mg by mouth daily.   . hydroxychloroquine (PLAQUENIL) 200 MG  Take by mouth daily. Take 1 tab AM and PM  . levothyroxine  100 MCG tablet Take 100 mcg by mouth daily before breakfast.  . losartan (COZAAR) 50 MG tablet Take 1 tablet daily for BP  . meloxicam (MOBIC) 15 MG tablet Take one daily with food for 2 weeks, can take with tylenol, can not take with aleve, iburpofen, then as needed daily for pain  . methotrexate  (RHEUMATREX) 2.5 MG  25 mg once a week.    Allergies  Allergen Reactions  . Ace Inhibitors Cough  . Ambien [Zolpidem] Other (See Comments)    Odd Feeling  . Crestor [Rosuvastatin]   . Pravastatin   . Prozac [Fluoxetine Hcl] Other (See Comments)    Decreased libido  . Zoloft [Sertraline Hcl]    PMHx:   Past Medical History  Diagnosis Date  . Hyperlipidemia   . Hypothyroid   . Anemia     Hx of   . H. pylori infection     Hx of   . GERD (gastroesophageal reflux disease)   . PUD (peptic ulcer disease)   . Calculus of bile duct without mention of cholecystitis or obstruction   . Arthritis   . Rheumatoid arthritis(714.0)   . Hypertension   . Cataract   . Heart murmur    Immunization History  Administered Date(s) Administered  . Influenza, High Dose Seasonal PF 07/16/2014  . Pneumococcal Polysaccharide-23 11/08/2012  . Td 11/08/2012   Past Surgical History  Procedure Laterality Date  . Vaginal hysterectomy      ovaries not removed  . Carpal tunnel release    . Cystectomy      left breast  . Av fistula repair    . Bladder suspension    . Pilonidal cyst excision    .  Trigger finger release    . Bunionectomy    . Ercp  09/29/2011    Procedure: ENDOSCOPIC RETROGRADE CHOLANGIOPANCREATOGRAPHY (ERCP);  Surgeon: Inda Castle, MD;  Location: [facility];  Service: Endoscopy;  Laterality: N/A;  . Tonsillectomy    . Cholecystectomy  09/07/2011  . Tenolysis  10/26/2011    Procedure: TENDON SHEATH RELEASE/TENOLYSIS;  Surgeon: Wynonia Sours, MD;  Location: [place];  Service: Orthopedics;  Laterality: Right;  tenosynovectomy removal superficialis slip right index finger  . 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 Enrigue [facility]ena at [facility]   FHx:    Reviewed / unchanged  SHx:    Reviewed / unchanged  Systems Review:  Constitutional: Denies fever, chills, wt changes, headaches, insomnia, fatigue, night sweats, change in appetite. Eyes: Denies redness,  blurred vision, diplopia, discharge, itchy, watery eyes.  ENT: Denies discharge, congestion, post nasal drip, epistaxis, sore throat, earache, hearing loss, dental pain, tinnitus, vertigo, sinus pain, snoring.  CV: Denies chest pain, palpitations, irregular heartbeat, syncope, dyspnea, diaphoresis, orthopnea, PND, claudication or edema. Respiratory: denies cough, dyspnea, DOE, pleurisy, hoarseness, laryngitis, wheezing.  Gastrointestinal: Denies dysphagia, odynophagia, heartburn, reflux, water brash, abdominal pain or cramps, nausea, vomiting, bloating, diarrhea, constipation, hematemesis, melena, hematochezia  or hemorrhoids. Genitourinary: Denies dysuria, frequency, urgency, nocturia, hesitancy, discharge, hematuria or flank pain. Musculoskeletal: Denies arthralgias, myalgias, stiffness, jt. swelling, pain, limping or strain/sprain.  Skin: Denies pruritus, rash, hives, warts, acne, eczema or change in skin lesion(s). Neuro: No weakness, tremor, incoordination, spasms, paresthesia or pain. Psychiatric: Denies confusion, memory loss or sensory loss. Endo: Denies change in weight, skin or hair change.  Heme/Lymph: No excessive bleeding, bruising or enlarged lymph nodes.  Physical Exam  BP 142/76 mmHg  Pulse 68  Temp(Src) 97.7 F (36.5 C)  Resp 16  Ht 5\' 2"  (1.575 m)  Wt 148 lb 12.8 oz (67.495 kg)  BMI 27.21 kg/m2  Appears well nourished and in no distress. Eyes: PERRLA, EOMs, conjunctiva no swelling or erythema. Sinuses: No frontal/maxillary tenderness ENT/Mouth: EAC's clear, TM's nl w/o erythema, bulging. Nares clear w/o erythema, swelling, exudates. Oropharynx clear without erythema or exudates. Oral hygiene is good. Tongue normal, non obstructing. Hearing intact.  Neck: Supple. Thyroid nl. Car 2+/2+ without bruits, nodes or JVD. Chest: Respirations nl with BS clear & equal w/o rales, rhonchi, wheezing or stridor.  Cor: Heart sounds normal w/ regular rate and rhythm without sig.  murmurs, gallops, clicks, or rubs. Peripheral pulses normal and equal  without edema.  Abdomen: Soft & bowel sounds normal. Non-tender w/o guarding, rebound, hernias, masses, or organomegaly.  Lymphatics: Unremarkable.  Musculoskeletal: Full ROM all peripheral extremities, joint stability, 5/5 strength, and normal gait.  Skin: Warm, dry without exposed rashes, lesions or ecchymosis apparent.  Neuro: Cranial nerves intact, reflexes equal bilaterally. Sensory-motor testing grossly intact. Tendon reflexes grossly intact.  Pysch: Alert & oriented x 3.  Insight and judgement nl & appropriate. No ideations.  Assessment and Plan:  1. Essential hypertension  - TSH  2. Hyperlipidemia  - Lipid panel  3. Vitamin D deficiency  - Vit D  25 hydroxy (rtn osteoporosis monitoring)  4. Prediabetes  - Hemoglobin A1c - Insulin, random  5. 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 BMI 27.0-27.9,adult   7. Need for prophylactic vaccination and inoculation against influenza  - Flu vaccine HIGH DOSE PF (Fluzone High Dose)  8. Need for shingles vaccine  - Varicella-zoster vaccine subcutaneous  9. Medication management  -  CBC with Differential/Platelet - BASIC METABOLIC PANEL WITH GFR - Hepatic function panel - Magnesium   Recommended regular exercise, BP monitoring, weight control, and discussed med and SE's. Recommended labs to assess and monitor clinical status. Further disposition pending results of labs. Over 30 minutes of exam, counseling, chart review was performed

## 2015-09-17 NOTE — Patient Instructions (Signed)

## 2015-09-18 LAB — VITAMIN D 25 HYDROXY (VIT D DEFICIENCY, FRACTURES): VIT D 25 HYDROXY: 74 ng/mL (ref 30–100)

## 2015-09-18 LAB — INSULIN, RANDOM: Insulin: 7.5 u[IU]/mL (ref 2.0–19.6)

## 2015-10-16 ENCOUNTER — Ambulatory Visit (INDEPENDENT_AMBULATORY_CARE_PROVIDER_SITE_OTHER): Payer: Commercial Managed Care - HMO

## 2015-10-16 DIAGNOSIS — Z23 Encounter for immunization: Secondary | ICD-10-CM | POA: Diagnosis not present

## 2015-10-29 ENCOUNTER — Ambulatory Visit: Payer: Self-pay | Admitting: Internal Medicine

## 2015-11-21 ENCOUNTER — Other Ambulatory Visit: Payer: Self-pay | Admitting: Physician Assistant

## 2015-11-21 DIAGNOSIS — F411 Generalized anxiety disorder: Secondary | ICD-10-CM

## 2015-11-24 ENCOUNTER — Other Ambulatory Visit: Payer: Self-pay | Admitting: *Deleted

## 2015-11-24 ENCOUNTER — Other Ambulatory Visit: Payer: Self-pay | Admitting: Physician Assistant

## 2015-11-24 DIAGNOSIS — F411 Generalized anxiety disorder: Secondary | ICD-10-CM

## 2015-11-24 MED ORDER — ALPRAZOLAM 1 MG PO TABS: ORAL_TABLET | ORAL | Status: DC

## 2015-11-28 DIAGNOSIS — M150 Primary generalized (osteo)arthritis: Secondary | ICD-10-CM | POA: Diagnosis not present

## 2015-11-28 DIAGNOSIS — M25512 Pain in left shoulder: Secondary | ICD-10-CM | POA: Diagnosis not present

## 2015-11-28 DIAGNOSIS — M79642 Pain in left hand: Secondary | ICD-10-CM | POA: Diagnosis not present

## 2015-11-28 DIAGNOSIS — Z79899 Other long term (current) drug therapy: Secondary | ICD-10-CM | POA: Diagnosis not present

## 2015-11-28 DIAGNOSIS — R202 Paresthesia of skin: Secondary | ICD-10-CM | POA: Diagnosis not present

## 2015-11-28 DIAGNOSIS — M0609 Rheumatoid arthritis without rheumatoid factor, multiple sites: Secondary | ICD-10-CM | POA: Diagnosis not present

## 2015-11-28 DIAGNOSIS — M79641 Pain in right hand: Secondary | ICD-10-CM | POA: Diagnosis not present

## 2015-12-09 DIAGNOSIS — Z9229 Personal history of other drug therapy: Secondary | ICD-10-CM | POA: Diagnosis not present

## 2015-12-09 DIAGNOSIS — Z79899 Other long term (current) drug therapy: Secondary | ICD-10-CM | POA: Diagnosis not present

## 2015-12-09 DIAGNOSIS — M0609 Rheumatoid arthritis without rheumatoid factor, multiple sites: Secondary | ICD-10-CM | POA: Diagnosis not present

## 2015-12-16 ENCOUNTER — Other Ambulatory Visit: Payer: Self-pay | Admitting: Internal Medicine

## 2015-12-22 DIAGNOSIS — E039 Hypothyroidism, unspecified: Secondary | ICD-10-CM | POA: Diagnosis not present

## 2016-01-19 DIAGNOSIS — M79645 Pain in left finger(s): Secondary | ICD-10-CM | POA: Diagnosis not present

## 2016-01-19 DIAGNOSIS — M654 Radial styloid tenosynovitis [de Quervain]: Secondary | ICD-10-CM | POA: Diagnosis not present

## 2016-01-19 DIAGNOSIS — M5136 Other intervertebral disc degeneration, lumbar region: Secondary | ICD-10-CM | POA: Diagnosis not present

## 2016-01-27 ENCOUNTER — Other Ambulatory Visit: Payer: Self-pay | Admitting: Internal Medicine

## 2016-02-19 ENCOUNTER — Ambulatory Visit (INDEPENDENT_AMBULATORY_CARE_PROVIDER_SITE_OTHER): Payer: Medicare PPO | Admitting: Internal Medicine

## 2016-02-19 ENCOUNTER — Encounter: Payer: Self-pay | Admitting: Internal Medicine

## 2016-02-19 VITALS — BP 124/72 | HR 72 | Temp 97.5°F | Resp 16 | Ht 62.0 in | Wt 149.6 lb

## 2016-02-19 DIAGNOSIS — E559 Vitamin D deficiency, unspecified: Secondary | ICD-10-CM

## 2016-02-19 DIAGNOSIS — M069 Rheumatoid arthritis, unspecified: Secondary | ICD-10-CM

## 2016-02-19 DIAGNOSIS — I10 Essential (primary) hypertension: Secondary | ICD-10-CM

## 2016-02-19 DIAGNOSIS — M810 Age-related osteoporosis without current pathological fracture: Secondary | ICD-10-CM

## 2016-02-19 DIAGNOSIS — Z136 Encounter for screening for cardiovascular disorders: Secondary | ICD-10-CM

## 2016-02-19 DIAGNOSIS — R7303 Prediabetes: Secondary | ICD-10-CM

## 2016-02-19 DIAGNOSIS — Z0001 Encounter for general adult medical examination with abnormal findings: Secondary | ICD-10-CM

## 2016-02-19 DIAGNOSIS — R7309 Other abnormal glucose: Secondary | ICD-10-CM | POA: Diagnosis not present

## 2016-02-19 DIAGNOSIS — Z0000 Encounter for general adult medical examination without abnormal findings: Secondary | ICD-10-CM | POA: Diagnosis not present

## 2016-02-19 DIAGNOSIS — Z79899 Other long term (current) drug therapy: Secondary | ICD-10-CM

## 2016-02-19 DIAGNOSIS — E039 Hypothyroidism, unspecified: Secondary | ICD-10-CM

## 2016-02-19 DIAGNOSIS — E785 Hyperlipidemia, unspecified: Secondary | ICD-10-CM

## 2016-02-19 DIAGNOSIS — Z1212 Encounter for screening for malignant neoplasm of rectum: Secondary | ICD-10-CM

## 2016-02-19 DIAGNOSIS — R6889 Other general symptoms and signs: Secondary | ICD-10-CM | POA: Diagnosis not present

## 2016-02-19 NOTE — Progress Notes (Signed)
Patient ID: [name], female   DOB: 18-Aug-1946, 70 y.o.   MRN: [id number]  Annual Screening/Preventative Visit And Comprehensive Evaluation &  Examination  This very nice 70 y.o. MWF presents for a Wellness/Preventative Visit & comprehensive evaluation and management of multiple medical co-morbidities.  Patient has been followed for HTN, T2_NIDDM  Prediabetes, Hyperlipidemia and Vitamin D Deficiency. Patient also has GERD controlled with diet & meds. Patient has Rheumatoid Arthritis predating since 1985 and is followed by Dr Lenna Gilford.    HTN predates circa 1997. Patient's BP has been controlled at home and patient denies any cardiac symptoms as chest pain, palpitations, shortness of breath, dizziness or ankle swelling. Today's BP: 124/72 mmHg    Patient's hyperlipidemia is controlled with diet and medications. Last lipids were at goal with  Cholesterol 185; HDL 91; LDL 82; Triglycerides 59 on 09/17/2015.   Patient has prediabetes predating since 2011 with A1c 5.8% and patient denies reactive hypoglycemic symptoms, visual blurring, diabetic polys, or paresthesias. Last A1c was  5.3% on 09/17/2015.   Patient has been on Thyroid replacement 1980. Finally, patient has history of Vitamin D Deficiency of 38 in 2008  and last Vitamin D was 74 on 09/17/2015.    Medication Sig  . ALPRAZolam  1 MG  TAKE 1 TABLET TWICE DAILY AS NEEDED FOR ANXIETY  . aspirin 81 MG  Take 81 mg by mouth daily.  Marland Kitchen VITAMIN D  Take 5,000 Units by mouth daily.   . folic acid  1 MG  Take 3 mg by mouth daily.   . hydroxychloroquine  200 MG  Take by mouth daily. Take 1 tab AM and PM  . levothyroxine  100 MCG  Take 100 mcg by mouth daily before breakfast.  . losartan  50 MG TAKE 1 TABLET DAILY FOR BLOOD PRESSURE  . methotrexate  2.5 MG  25 mg once a week.   Marland Kitchen omeprazole  40 MG  TAKE 1 CAPSULE TWICE DAILY  FOR  ACID  REFLUX   Allergies  Allergen Reactions  . Ace Inhibitors Cough  . Ambien [Zolpidem] Other (See Comments)     Odd Feeling  . Crestor [Rosuvastatin]   . Pravastatin   . Prozac [Fluoxetine Hcl] Other (See Comments)    Decreased libido  . Zoloft [Sertraline Hcl]    Past Medical History  Diagnosis Date  . Hyperlipidemia   . Hypothyroid   . Anemia     Hx of   . H. pylori infection     Hx of   . GERD (gastroesophageal reflux disease)   . PUD (peptic ulcer disease)   . Calculus of bile duct without mention of cholecystitis or obstruction   . Arthritis   . Rheumatoid arthritis(714.0)   . Hypertension   . Cataract   . Heart murmur    Health Maintenance  Topic Date Due  . Hepatitis C Screening  1946/10/29  . MAMMOGRAM  01/08/2016  . INFLUENZA VACCINE  06/08/2016  . COLONOSCOPY  03/08/2018  . TETANUS/TDAP  11/08/2022  . DEXA SCAN  Completed  . ZOSTAVAX  Completed  . PNA vac Low Risk Adult  Completed   Immunization History  Administered Date(s) Administered  . Influenza, High Dose Seasonal PF 07/16/2014, 09/17/2015  . Pneumococcal Conjugate-13 10/16/2015  . Pneumococcal Polysaccharide-23 11/08/2012  . Td 11/08/2012  . Zoster 09/17/2015   Past Surgical History  Procedure Laterality Date  . Vaginal hysterectomy      ovaries not removed  . Carpal tunnel release    .  Cystectomy      left breast  . Av fistula repair    . Bladder suspension    . Pilonidal cyst excision    . Trigger finger release    . Bunionectomy    . Ercp  09/29/2011    Procedure: ENDOSCOPIC RETROGRADE CHOLANGIOPANCREATOGRAPHY (ERCP);  Surgeon: Inda Castle, MD;  Location: [facility];  Service: Endoscopy;  Laterality: N/A;  . Tonsillectomy    . Cholecystectomy  09/07/2011  . Tenolysis  10/26/2011    Procedure: TENDON SHEATH RELEASE/TENOLYSIS;  Surgeon: Wynonia Sours, MD;  Location: [place];  Service: Orthopedics;  Laterality: Right;  tenosynovectomy removal superficialis slip right index finger  . 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 Enrigue Catena at [facility] History   Problem Relation Age of Onset  . Lung cancer Mother   . Stomach cancer Father   . Heart attack Father     MI at age 66  . Colon cancer Maternal Grandfather   . Heart attack Brother     MI at age 71  . Prostate cancer Paternal Grandfather    Social History  Substance Use Topics  . Smoking status: Former Smoker    Quit date: 10/25/1995  . Smokeless tobacco: Never Used     Comment: Maryann Conners [id number]  . Alcohol Use: 0.0 oz/week    0 Standard drinks or equivalent per week     Comment: occasional    ROS Constitutional: Denies fever, chills, weight loss/gain, headaches, insomnia,  night sweats, and change in appetite. Does c/o fatigue. Eyes: Denies redness, blurred vision, diplopia, discharge, itchy, watery eyes.  ENT: Denies discharge, congestion, post nasal drip, epistaxis, sore throat, earache, hearing loss, dental pain, Tinnitus, Vertigo, Sinus pain, snoring.  Cardio: Denies chest pain, palpitations, irregular heartbeat, syncope, dyspnea, diaphoresis, orthopnea, PND, claudication, edema Respiratory: denies cough, dyspnea, DOE, pleurisy, hoarseness, laryngitis, wheezing.  Gastrointestinal: Denies dysphagia, heartburn, reflux, water brash, pain, cramps, nausea, vomiting, bloating, diarrhea, constipation, hematemesis, melena, hematochezia, jaundice, hemorrhoids Genitourinary: Denies dysuria, frequency, urgency, nocturia, hesitancy, discharge, hematuria, flank pain Breast: Breast lumps, nipple discharge, bleeding.  Musculoskeletal: Denies arthralgia, myalgia, stiffness, Jt. Swelling, pain, limp, and strain/sprain. Denies falls. Skin: Denies puritis, rash, hives, warts, acne, eczema, changing in skin lesion Neuro: No weakness, tremor, incoordination, spasms, paresthesia, pain Psychiatric: Denies confusion, memory loss, sensory loss. Denies Depression. Endocrine: Denies change in weight, skin, hair change, nocturia, and paresthesia, diabetic polys, visual blurring, hyper / hypo glycemic episodes.   Heme/Lymph: No excessive bleeding, bruising, enlarged lymph nodes.  Physical Exam  BP 124/72 mmHg  Pulse 72  Temp(Src) 97.5 F (36.4 C)  Resp 16  Ht 5\' 2"  (1.575 m)  Wt 149 lb 9.6 oz (67.858 kg)  BMI 27.36 kg/m2  General Appearance: Well nourished and in no apparent distress. Eyes: PERRLA, EOMs, conjunctiva no swelling or erythema, normal fundi and vessels. Sinuses: No frontal/maxillary tenderness ENT/Mouth: EACs patent / TMs  nl. Nares clear without erythema, swelling, mucoid exudates. Oral hygiene is good. No erythema, swelling, or exudate. Tongue normal, non-obstructing. Tonsils not swollen or erythematous. Hearing normal.  Neck: Supple, thyroid normal. No bruits, nodes or JVD. Respiratory: Respiratory effort normal.  BS equal and clear bilateral without rales, rhonci, wheezing or stridor. Cardio: Heart sounds are normal with regular rate and rhythm and no murmurs, rubs or gallops. Peripheral pulses are normal and equal bilaterally without edema. No aortic or femoral bruits. Chest: symmetric with normal excursions and percussion. Breasts: Symmetric, without lumps, nipple  discharge, retractions, or fibrocystic changes.  Abdomen: Flat, soft, with bowel sounds. Nontender, no guarding, rebound, hernias, masses, or organomegaly.  Lymphatics: Non tender without lymphadenopathy.  Musculoskeletal: Full ROM all peripheral extremities, joint stability, 5/5 strength, and normal gait. Skin: Warm and dry without rashes, lesions, cyanosis, clubbing or  ecchymosis.  Neuro: Cranial nerves intact, reflexes equal bilaterally. Normal muscle tone, no cerebellar symptoms. Sensation intact.  Pysch: Alert and oriented X 3, normal affect, Insight and Judgment appropriate.   Assessment and Plan  1. Annual Preventative Screening Examination  - Microalbumin / creatinine urine ratio - EKG 12-Lead - [place], RETROPERITNL ABD,  LTD - POC Hemoccult Bld/Stl  - Urinalysis, Routine w reflex microscopic  - CBC  with Differential/Platelet - BASIC METABOLIC PANEL WITH GFR - Hepatic function panel - Magnesium - Lipid panel - TSH - Hemoglobin A1c - Insulin, random   2. Essential hypertension  - Microalbumin / creatinine urine ratio - EKG 12-Lead - [place], RETROPERITNL ABD,  LTD - TSH  3. Hyperlipidemia  - Lipid panel - TSH  4. Prediabetes  - Hemoglobin A1c - Insulin, random  5. Vitamin D deficiency  - VITAMIN D 25 Hydroxy   6. Hypothyroidism   7. Rheumatoid arthritis involving multiple sites ([place])   8. Osteoporosis   9. Screening for rectal cancer  - POC Hemoccult Bld/Stl   10. Screening for ischemic heart disease   11. Screening for AAA (aortic abdominal aneurysm)   12. Medication management  - Urinalysis, Routine w reflex microscopic  - CBC with Differential/Platelet - BASIC METABOLIC PANEL WITH GFR - Hepatic function panel - Magnesium   Continue prudent diet as discussed, weight control, BP monitoring, regular exercise, and medications. Discussed med's effects and SE's. Screening labs and tests as requested with regular follow-up as recommended. Over 40 minutes of exam, counseling, chart review and high complex critical decision making was performed.

## 2016-02-19 NOTE — Patient Instructions (Signed)
Recommend Adult Low Dose Aspirin or   coated  Aspirin 81 mg daily   To reduce risk of Colon Cancer 20 %,   Skin Cancer 26 % ,   Melanoma 46%   and   Pancreatic cancer 60%   ++++++++++++++++++++++++++++++++++++++++++++++++++++++ Vitamin D goal   is between 70-100.   Please make sure that you are taking your Vitamin D as directed.   It is very important as a natural anti-inflammatory   helping hair, skin, and nails, as well as reducing stroke and heart attack risk.   It helps your bones and helps with mood.  It also decreases numerous cancer risks so please take it as directed.   Low Vit D is associated with a 200-300% higher risk for CANCER   and 200-300% higher risk for HEART   ATTACK  &  STROKE.   .....................................[name]  It is also associated with higher death rate at younger ages,   autoimmune diseases like Rheumatoid arthritis, Lupus, Multiple Sclerosis.     Also many other serious conditions, like depression, Alzheimer's  Dementia, infertility, muscle aches, fatigue, fibromyalgia - just to name a few.  ++++++++++++++++++++++++++++++++++++++++++++++++  Recommend the book "The END of DIETING" by Dr Excell Seltzer   & the book "The END of DIABETES " by Dr Excell Seltzer  At [email or web address] - get book & Audio CD's     Being diabetic has a  300% increased risk for heart attack, stroke, cancer, and alzheimer- type vascular dementia. It is very important that you work harder with diet by avoiding all foods that are white. Avoid white rice (brown & wild rice is OK), white potatoes (sweetpotatoes in moderation is OK), White bread or wheat bread or anything made out of white flour like bagels, donuts, rolls, buns, biscuits, cakes, pastries, cookies, pizza crust, and pasta (made from white flour & egg whites) - vegetarian pasta or spinach or wheat pasta is OK. Multigrain breads like Arnold's or Pepperidge [employer], or multigrain sandwich thins or flatbreads.  Diet,  exercise and weight loss can reverse and cure diabetes in the early stages.  Diet, exercise and weight loss is very important in the control and prevention of complications of diabetes which affects every system in your body, ie. Brain - dementia/stroke, eyes - glaucoma/blindness, heart - heart attack/heart failure, kidneys - dialysis, stomach - gastric paralysis, intestines - malabsorption, nerves - severe painful neuritis, circulation - gangrene & loss of a leg(s), and finally cancer and Alzheimers.    I recommend avoid fried & greasy foods,  sweets/candy, white rice (brown or wild rice or Quinoa is OK), white potatoes (sweet potatoes are OK) - anything made from white flour - bagels, doughnuts, rolls, buns, biscuits,white and wheat breads, pizza crust and traditional pasta made of white flour & egg white(vegetarian pasta or spinach or wheat pasta is OK).  Multi-grain bread is OK - like multi-grain flat bread or sandwich thins. Avoid alcohol in excess. Exercise is also important.    Eat all the vegetables you want - avoid meat, especially red meat and dairy - especially cheese.  Cheese is the most concentrated form of trans-fats which is the worst thing to clog up our arteries. Veggie cheese is OK which can be found in the fresh produce section at [employer] or [employer] or [employer]  ++++++++++++++++++++++++++++++++++++++++++++++++++ DASH Eating Plan  DASH stands for "Dietary Approaches to Stop Hypertension."   The DASH eating plan is a healthy eating plan that has been shown to reduce high blood  pressure (hypertension). Additional health benefits may include reducing the risk of type 2 diabetes mellitus, heart disease, and stroke. The DASH eating plan may also help with weight loss.  WHAT DO I NEED TO KNOW ABOUT THE DASH EATING PLAN?  For the DASH eating plan, you will follow these general guidelines:  Choose foods with a percent daily value for sodium of less than 5% (as listed on the food  label).  Use salt-free seasonings or herbs instead of table salt or sea salt.  Check with your health care provider or pharmacist before using salt substitutes.  Eat lower-sodium products, often labeled as "lower sodium" or "no salt added."  Eat fresh foods.  Eat more vegetables, fruits, and low-fat dairy products.    Choose whole grains. Look for the word "whole" as the first word in the ingredient list.  Choose fish   Limit sweets, desserts, sugars, and sugary drinks.  Choose heart-healthy fats.  Eat veggie cheese   Eat more home-cooked food and less restaurant, buffet, and fast food.  Limit fried foods.  [place]uffaker foods using methods other than frying.  Limit canned vegetables. If you do use them, rinse them well to decrease the sodium.  When eating at a restaurant, ask that your food be prepared with less salt, or no salt if possible.                      WHAT FOODS CAN I EAT?  Read Dr Fara Olden Fuhrman's books on The End of Dieting & The End of Diabetes  Grains  Whole grain or whole wheat bread. Brown rice. Whole grain or whole wheat pasta. Quinoa, bulgur, and whole grain cereals. Low-sodium cereals. Corn or whole wheat flour tortillas. Whole grain cornbread. Whole grain crackers. Low-sodium crackers.  Vegetables  Fresh or frozen vegetables (raw, steamed, roasted, or grilled). Low-sodium or reduced-sodium tomato and vegetable juices. Low-sodium or reduced-sodium tomato sauce and paste. Low-sodium or reduced-sodium canned vegetables.   Fruits  All fresh, canned (in natural juice), or frozen fruits.  Protein Products   All fish and seafood.  Dried beans, peas, or lentils. Unsalted nuts and seeds. Unsalted canned beans.  Dairy  Low-fat dairy products, such as skim or 1% milk, 2% or reduced-fat cheeses, low-fat ricotta or cottage cheese, or plain low-fat yogurt. Low-sodium or reduced-sodium cheeses.  Fats and Oils  Tub margarines without trans fats. Light or  reduced-fat mayonnaise and salad dressings (reduced sodium). Avocado. Safflower, olive, or canola oils. Natural peanut or almond butter.  Other  Unsalted popcorn and pretzels. The items listed above may not be a complete list of recommended foods or beverages. Contact your dietitian for more options.  +++++++++++++++++++++++++++++++++++++++++++  WHAT FOODS ARE NOT RECOMMENDED?  Grains/ White flour or wheat flour  White bread. White pasta. White rice. Refined cornbread. Bagels and croissants. Crackers that contain trans fat.  Vegetables  Creamed or fried vegetables. Vegetables in a . Regular canned vegetables. Regular canned tomato sauce and paste. Regular tomato and vegetable juices.  Fruits  Dried fruits. Canned fruit in light or heavy syrup. Fruit juice.  Meat and Other Protein Products  Meat in general - RED mwaet & White meat.  Fatty cuts of meat. Ribs, chicken wings, bacon, sausage, bologna, salami, chitterlings, fatback, hot dogs, bratwurst, and packaged luncheon meats.  Dairy  Whole or 2% milk, cream, half-and-half, and cream cheese. Whole-fat or sweetened yogurt. Full-fat cheeses or blue cheese. Nondairy creamers and whipped toppings. Processed cheese, cheese spreads, or  cheese curds.  Condiments  Onion and garlic salt, seasoned salt, table salt, and sea salt. Canned and packaged gravies. Worcestershire sauce. Tartar sauce. Barbecue sauce. Teriyaki sauce. Soy sauce, including reduced sodium. Steak sauce. Fish sauce. Oyster sauce. Cocktail sauce. Horseradish. Ketchup and mustard. Meat flavorings and tenderizers. Bouillon cubes. Hot sauce. Tabasco sauce. Marinades. Taco seasonings. Relishes.  Fats and Oils Butter, stick margarine, lard, shortening and bacon fat. Coconut, palm kernel, or palm oils. Regular salad dressings.  Pickles and olives. Salted popcorn and pretzels.  The items listed above may not be a complete list of foods and beverages to avoid.   Preventive  Care for Adults  A healthy lifestyle and preventive care can promote health and wellness. Preventive health guidelines for women include the following key practices.  A routine yearly physical is a good way to check with your health care provider about your health and preventive screening. It is a chance to share any concerns and updates on your health and to receive a thorough exam.  Visit your dentist for a routine exam and preventive care every 6 months. Brush your teeth twice a day and floss once a day. Good oral hygiene prevents tooth decay and gum disease.  The frequency of eye exams is based on your age, health, family medical history, use of contact lenses, and other factors. Follow your health care provider's recommendations for frequency of eye exams.  Eat a healthy diet. Foods like vegetables, fruits, whole grains, low-fat dairy products, and lean protein foods contain the nutrients you need without too many calories. Decrease your intake of foods high in solid fats, added sugars, and salt. Eat the right amount of calories for you.Get information about a proper diet from your health care provider, if necessary.  Regular physical exercise is one of the most important things you can do for your health. Most adults should get at least 150 minutes of moderate-intensity exercise (any activity that increases your heart rate and causes you to sweat) each week. In addition, most adults need muscle-strengthening exercises on 2 or more days a week.  Maintain a healthy weight. The body mass index (BMI) is a screening tool to identify possible weight problems. It provides an estimate of body fat based on height and weight. Your health care provider can find your BMI and can help you achieve or maintain a healthy weight.For adults 20 years and older:  A BMI below 18.5 is considered underweight.  A BMI of 18.5 to 24.9 is normal.  A BMI of 25 to 29.9 is considered overweight.  A BMI of 30 and  above is considered obese.  Maintain normal blood lipids and cholesterol levels by exercising and minimizing your intake of saturated fat. Eat a balanced diet with plenty of fruit and vegetables. If your lipid or cholesterol levels are high, you are over 50, or you are at high risk for heart disease, you may need your cholesterol levels checked more frequently.Ongoing high lipid and cholesterol levels should be treated with medicines if diet and exercise are not working.  If you smoke, find out from your health care provider how to quit. If you do not use tobacco, do not start.  Lung cancer screening is recommended for adults aged 56-80 years who are at high risk for developing lung cancer because of a history of smoking. A yearly low-dose CT scan of the lungs is recommended for people who have at least a 30-pack-year history of smoking and are a current smoker or  have quit within the past 15 years. A pack year of smoking is smoking an average of 1 pack of cigarettes a day for 1 year (for example: 1 pack a day for 30 years or 2 packs a day for 15 years). Yearly screening should continue until the smoker has stopped smoking for at least 15 years. Yearly screening should be stopped for people who develop a health problem that would prevent them from having lung cancer treatment.  Avoid use of street drugs. Do not share needles with anyone. Ask for help if you need support or instructions about stopping the use of drugs.  High blood pressure causes heart disease and increases the risk of stroke.  Ongoing high blood pressure should be treated with medicines if weight loss and exercise do not work.  If you are 52-67 years old, ask your health care provider if you should take aspirin to prevent strokes.  Diabetes screening involves taking a blood sample to check your fasting blood sugar level. This should be done once every 3 years, after age 9, if you are within normal weight and without risk factors for  diabetes. Testing should be considered at a younger age or be carried out more frequently if you are overweight and have at least 1 risk factor for diabetes.  Breast cancer screening is essential preventive care for women. You should practice "breast self-awareness." This means understanding the normal appearance and feel of your breasts and may include breast self-examination. Any changes detected, no matter how small, should be reported to a health care provider. Women in their 45s and 30s should have a clinical breast exam (CBE) by a health care provider as part of a regular health exam every 1 to 3 years. After age 76, women should have a CBE every year. Starting at age 54, women should consider having a mammogram (breast X-ray test) every year. Women who have a family history of breast cancer should talk to their health care provider about genetic screening. Women at a high risk of breast cancer should talk to their health care providers about having an MRI and a mammogram every year.  Breast cancer gene (BRCA)-related cancer risk assessment is recommended for women who have family members with BRCA-related cancers. BRCA-related cancers include breast, ovarian, tubal, and peritoneal cancers. Having family members with these cancers may be associated with an increased risk for harmful changes (mutations) in the breast cancer genes BRCA1 and BRCA2. Results of the assessment will determine the need for genetic counseling and BRCA1 and BRCA2 testing.  Routine pelvic exams to screen for cancer are no longer recommended for nonpregnant women who are considered low risk for cancer of the pelvic organs (ovaries, uterus, and vagina) and who do not have symptoms. Ask your health care provider if a screening pelvic exam is right for you.  If you have had past treatment for cervical cancer or a condition that could lead to cancer, you need Pap tests and screening for cancer for at least 20 years after your  treatment. If Pap tests have been discontinued, your risk factors (such as having a new sexual partner) need to be reassessed to determine if screening should be resumed. Some women have medical problems that increase the chance of getting cervical cancer. In these cases, your health care provider may recommend more frequent screening and Pap tests.    Colorectal cancer can be detected and often prevented. Most routine colorectal cancer screening begins at the age of 73 years and  continues through age 75 years. However, your health care provider may recommend screening at an earlier age if you have risk factors for colon cancer. On a yearly basis, your health care provider may provide home test kits to check for hidden blood in the stool. Use of a small camera at the end of a tube, to directly examine the colon (sigmoidoscopy or colonoscopy), can detect the earliest forms of colorectal cancer. Talk to your health care provider about this at age 50, when routine screening begins. Direct exam of the colon should be repeated every 5-10 years through age 75 years, unless early forms of pre-cancerous polyps or small growths are found.  Osteoporosis is a disease in which the bones lose minerals and strength with aging. This can result in serious bone fractures or breaks. The risk of osteoporosis can be identified using a bone density scan. Women ages 65 years and over and women at risk for fractures or osteoporosis should discuss screening with their health care providers. Ask your health care provider whether you should take a calcium supplement or vitamin D to reduce the rate of osteoporosis.  Menopause can be associated with physical symptoms and risks. Hormone replacement therapy is available to decrease symptoms and risks. You should talk to your health care provider about whether hormone replacement therapy is right for you.  Use sunscreen. Apply sunscreen liberally and repeatedly throughout the day. You  should seek shade when your shadow is shorter than you. Protect yourself by wearing long sleeves, pants, a wide-brimmed hat, and sunglasses year round, whenever you are outdoors.  Once a month, do a whole body skin exam, using a mirror to look at the skin on your back. Tell your health care provider of new moles, moles that have irregular borders, moles that are larger than a pencil eraser, or moles that have changed in shape or color.  Stay current with required vaccines (immunizations).  Influenza vaccine. All adults should be immunized every year.  Tetanus, diphtheria, and acellular pertussis (Td, Tdap) vaccine. Pregnant women should receive 1 dose of Tdap vaccine during each pregnancy. The dose should be obtained regardless of the length of time since the last dose. Immunization is preferred during the 27th-36th week of gestation. An adult who has not previously received Tdap or who does not know her vaccine status should receive 1 dose of Tdap. This initial dose should be followed by tetanus and diphtheria toxoids (Td) booster doses every 10 years. Adults with an unknown or incomplete history of completing a 3-dose immunization series with Td-containing vaccines should begin or complete a primary immunization series including a Tdap dose. Adults should receive a Td booster every 10 years.    Zoster vaccine. One dose is recommended for adults aged 60 years or older unless certain conditions are present.    Pneumococcal 13-valent conjugate (PCV13) vaccine. When indicated, a person who is uncertain of her immunization history and has no record of immunization should receive the PCV13 vaccine. An adult aged 19 years or older who has certain medical conditions and has not been previously immunized should receive 1 dose of PCV13 vaccine. This PCV13 should be followed with a dose of pneumococcal polysaccharide (PPSV23) vaccine. The PPSV23 vaccine dose should be obtained at least 8 weeks after the dose  of PCV13 vaccine. An adult aged 19 years or older who has certain medical conditions and previously received 1 or more doses of PPSV23 vaccine should receive 1 dose of PCV13. The PCV13 vaccine dose should   be obtained 1 or more years after the last PPSV23 vaccine dose.    Pneumococcal polysaccharide (PPSV23) vaccine. When PCV13 is also indicated, PCV13 should be obtained first. All adults aged 65 years and older should be immunized. An adult younger than age 65 years who has certain medical conditions should be immunized. Any person who resides in a nursing home or long-term care facility should be immunized. An adult smoker should be immunized. People with an immunocompromised condition and certain other conditions should receive both PCV13 and PPSV23 vaccines. People with human immunodeficiency virus (HIV) infection should be immunized as soon as possible after diagnosis. Immunization during chemotherapy or radiation therapy should be avoided. Routine use of PPSV23 vaccine is not recommended for American Indians, [place] Natives, or people younger than 65 years unless there are medical conditions that require PPSV23 vaccine. When indicated, people who have unknown immunization and have no record of immunization should receive PPSV23 vaccine. One-time revaccination 5 years after the first dose of PPSV23 is recommended for people aged 19-64 years who have chronic kidney failure, nephrotic syndrome, asplenia, or immunocompromised conditions. People who received 1-2 doses of PPSV23 before age 65 years should receive another dose of PPSV23 vaccine at age 65 years or later if at least 5 years have passed since the previous dose. Doses of PPSV23 are not needed for people immunized with PPSV23 at or after age 65 years.   Preventive Services / Frequency  Ages 65 years and over  Blood pressure check.  Lipid and cholesterol check.  Lung cancer screening. / Every year if you are aged 55-80 years and have a  30-pack-year history of smoking and currently smoke or have quit within the past 15 years. Yearly screening is stopped once you have quit smoking for at least 15 years or develop a health problem that would prevent you from having lung cancer treatment.  Clinical breast exam.** / Every year after age 40 years.  BRCA-related cancer risk assessment.** / For women who have family members with a BRCA-related cancer (breast, ovarian, tubal, or peritoneal cancers).  Mammogram.** / Every year beginning at age 40 years and continuing for as long as you are in good health. Consult with your health care provider.  Pap test.** / Every 3 years starting at age 30 years through age 65 or 70 years with 3 consecutive normal Pap tests. Testing can be stopped between 65 and 70 years with 3 consecutive normal Pap tests and no abnormal Pap or HPV tests in the past 10 years.  Fecal occult blood test (FOBT) of stool. / Every year beginning at age 50 years and continuing until age 75 years. You may not need to do this test if you get a colonoscopy every 10 years.  Flexible sigmoidoscopy or colonoscopy.** / Every 5 years for a flexible sigmoidoscopy or every 10 years for a colonoscopy beginning at age 50 years and continuing until age 75 years.  Hepatitis C blood test.** / For all people born from 1945 through 1965 and any individual with known risks for hepatitis C.  Osteoporosis screening.** / A one-time screening for women ages 65 years and over and women at risk for fractures or osteoporosis.  Skin self-exam. / Monthly.  Influenza vaccine. / Every year.  Tetanus, diphtheria, and acellular pertussis (Tdap/Td) vaccine.** / 1 dose of Td every 10 years.  Zoster vaccine.** / 1 dose for adults aged 60 years or older.  Pneumococcal 13-valent conjugate (PCV13) vaccine.** / Consult your health care provider.    Pneumococcal polysaccharide (PPSV23) vaccine.** / 1 dose for all adults aged 68 years and older. Screening  for abdominal aortic aneurysm (AAA)  by ultrasound is recommended for people who have history of high blood pressure or who are current or former smokers.

## 2016-02-20 LAB — BASIC METABOLIC PANEL WITH GFR
BUN: 6 mg/dL — ABNORMAL LOW (ref 7–25)
CHLORIDE: 102 mmol/L (ref 98–110)
CO2: 27 mmol/L (ref 20–31)
CREATININE: 0.78 mg/dL (ref 0.50–0.99)
Calcium: 9.9 mg/dL (ref 8.6–10.4)
GFR, Est African American: >89 mL/min (ref >=60)
GFR, Est Non African American: 78 mL/min (ref >=60)
Glucose, Bld: 102 mg/dL — ABNORMAL HIGH (ref 65–99)
POTASSIUM: 4.4 mmol/L (ref 3.5–5.3)
SODIUM: 137 mmol/L (ref 135–146)

## 2016-02-20 LAB — MICROALBUMIN / CREATININE URINE RATIO
Creatinine, Urine: 18 mg/dL — ABNORMAL LOW (ref 20–320)
Microalb, Ur: <0.2 mg/dL

## 2016-02-20 LAB — CBC WITH DIFFERENTIAL/PLATELET
BASOS ABS: 44 {cells}/uL (ref 0–200)
Basophils Relative: 1 %
EOS ABS: 308 {cells}/uL (ref 15–500)
EOS PCT: 7 %
HCT: 36.7 % (ref 35.0–45.0)
HEMOGLOBIN: 12.1 g/dL (ref 11.7–15.5)
LYMPHS ABS: 924 {cells}/uL (ref 850–3900)
Lymphocytes Relative: 21 %
MCH: 29.4 pg (ref 27.0–33.0)
MCHC: 33 g/dL (ref 32.0–36.0)
MCV: 89.3 fL (ref 80.0–100.0)
MPV: 11.1 fL (ref 7.5–12.5)
Monocytes Absolute: 396 cells/uL (ref 200–950)
Monocytes Relative: 9 %
NEUTROS ABS: 2728 {cells}/uL (ref 1500–7800)
Neutrophils Relative %: 62 %
Platelets: 204 10*3/uL (ref 140–400)
RBC: 4.11 MIL/uL (ref 3.80–5.10)
RDW: 15 % (ref 11.0–15.0)
WBC: 4.4 10*3/uL (ref 3.8–10.8)

## 2016-02-20 LAB — LIPID PANEL
CHOL/HDL RATIO: 2.6 ratio (ref <=5.0)
Cholesterol: 190 mg/dL (ref 125–200)
HDL: 72 mg/dL (ref >=46)
LDL CALC: 102 mg/dL (ref <130)
Triglycerides: 78 mg/dL (ref <150)
VLDL: 16 mg/dL (ref <30)

## 2016-02-20 LAB — TSH: TSH: 4.45 m[IU]/L

## 2016-02-20 LAB — HEPATIC FUNCTION PANEL
ALK PHOS: 92 U/L (ref 33–130)
ALT: 24 U/L (ref 6–29)
AST: 37 U/L — AB (ref 10–35)
Albumin: 3.9 g/dL (ref 3.6–5.1)
BILIRUBIN DIRECT: 0.1 mg/dL (ref <=0.2)
Indirect Bilirubin: 0.4 mg/dL (ref 0.2–1.2)
Total Bilirubin: 0.5 mg/dL (ref 0.2–1.2)
Total Protein: 5.9 g/dL — ABNORMAL LOW (ref 6.1–8.1)

## 2016-02-20 LAB — HEMOGLOBIN A1C
HEMOGLOBIN A1C: 5.2 % (ref <5.7)
Mean Plasma Glucose: 103 mg/dL

## 2016-02-20 LAB — URINALYSIS, ROUTINE W REFLEX MICROSCOPIC
Bilirubin Urine: NEGATIVE
GLUCOSE, UA: NEGATIVE
Hgb urine dipstick: NEGATIVE
Ketones, ur: NEGATIVE
LEUKOCYTES UA: NEGATIVE
Nitrite: NEGATIVE
PH: 6.5 (ref 5.0–8.0)
Protein, ur: NEGATIVE
SPECIFIC GRAVITY, URINE: 1.009 (ref 1.001–1.035)

## 2016-02-20 LAB — MAGNESIUM: MAGNESIUM: 1.8 mg/dL (ref 1.5–2.5)

## 2016-02-20 LAB — INSULIN, RANDOM: Insulin: 16.5 u[IU]/mL (ref 2.0–19.6)

## 2016-02-20 LAB — VITAMIN D 25 HYDROXY (VIT D DEFICIENCY, FRACTURES): VIT D 25 HYDROXY: 77 ng/mL (ref 30–100)

## 2016-03-03 DIAGNOSIS — M0609 Rheumatoid arthritis without rheumatoid factor, multiple sites: Secondary | ICD-10-CM | POA: Diagnosis not present

## 2016-03-03 DIAGNOSIS — M150 Primary generalized (osteo)arthritis: Secondary | ICD-10-CM | POA: Diagnosis not present

## 2016-03-03 DIAGNOSIS — Z79899 Other long term (current) drug therapy: Secondary | ICD-10-CM | POA: Diagnosis not present

## 2016-03-03 DIAGNOSIS — M79642 Pain in left hand: Secondary | ICD-10-CM | POA: Diagnosis not present

## 2016-03-03 DIAGNOSIS — M79641 Pain in right hand: Secondary | ICD-10-CM | POA: Diagnosis not present

## 2016-03-25 ENCOUNTER — Other Ambulatory Visit: Payer: Self-pay | Admitting: Internal Medicine

## 2016-03-25 ENCOUNTER — Encounter: Payer: Self-pay | Admitting: Internal Medicine

## 2016-03-25 DIAGNOSIS — H524 Presbyopia: Secondary | ICD-10-CM | POA: Diagnosis not present

## 2016-03-25 DIAGNOSIS — Z961 Presence of intraocular lens: Secondary | ICD-10-CM | POA: Diagnosis not present

## 2016-03-25 DIAGNOSIS — M069 Rheumatoid arthritis, unspecified: Secondary | ICD-10-CM | POA: Diagnosis not present

## 2016-03-25 DIAGNOSIS — H52223 Regular astigmatism, bilateral: Secondary | ICD-10-CM | POA: Diagnosis not present

## 2016-03-25 DIAGNOSIS — Z79899 Other long term (current) drug therapy: Secondary | ICD-10-CM | POA: Diagnosis not present

## 2016-04-13 DIAGNOSIS — E039 Hypothyroidism, unspecified: Secondary | ICD-10-CM | POA: Diagnosis not present

## 2016-04-20 DIAGNOSIS — E039 Hypothyroidism, unspecified: Secondary | ICD-10-CM | POA: Diagnosis not present

## 2016-05-21 ENCOUNTER — Ambulatory Visit: Payer: Self-pay | Admitting: Physician Assistant

## 2016-05-28 ENCOUNTER — Ambulatory Visit (INDEPENDENT_AMBULATORY_CARE_PROVIDER_SITE_OTHER): Payer: Medicare PPO | Admitting: Physician Assistant

## 2016-05-28 ENCOUNTER — Encounter: Payer: Self-pay | Admitting: Physician Assistant

## 2016-05-28 VITALS — BP 118/76 | HR 60 | Temp 98.2°F | Resp 14 | Ht 62.0 in | Wt 154.6 lb

## 2016-05-28 DIAGNOSIS — E039 Hypothyroidism, unspecified: Secondary | ICD-10-CM

## 2016-05-28 DIAGNOSIS — M069 Rheumatoid arthritis, unspecified: Secondary | ICD-10-CM | POA: Diagnosis not present

## 2016-05-28 DIAGNOSIS — E785 Hyperlipidemia, unspecified: Secondary | ICD-10-CM

## 2016-05-28 DIAGNOSIS — F411 Generalized anxiety disorder: Secondary | ICD-10-CM | POA: Diagnosis not present

## 2016-05-28 DIAGNOSIS — R7303 Prediabetes: Secondary | ICD-10-CM

## 2016-05-28 DIAGNOSIS — I10 Essential (primary) hypertension: Secondary | ICD-10-CM

## 2016-05-28 DIAGNOSIS — E559 Vitamin D deficiency, unspecified: Secondary | ICD-10-CM | POA: Diagnosis not present

## 2016-05-28 DIAGNOSIS — E213 Hyperparathyroidism, unspecified: Secondary | ICD-10-CM

## 2016-05-28 DIAGNOSIS — Z79899 Other long term (current) drug therapy: Secondary | ICD-10-CM | POA: Diagnosis not present

## 2016-05-28 LAB — LIPID PANEL
Cholesterol: 192 mg/dL (ref 125–200)
HDL: 88 mg/dL (ref >=46)
LDL CALC: 92 mg/dL (ref <130)
Total CHOL/HDL Ratio: 2.2 Ratio (ref <=5.0)
Triglycerides: 61 mg/dL (ref <150)
VLDL: 12 mg/dL (ref <30)

## 2016-05-28 LAB — CBC WITH DIFFERENTIAL/PLATELET
BASOS ABS: 46 {cells}/uL (ref 0–200)
BASOS PCT: 1 %
EOS ABS: 322 {cells}/uL (ref 15–500)
Eosinophils Relative: 7 %
HEMATOCRIT: 38.3 % (ref 35.0–45.0)
HEMOGLOBIN: 12.4 g/dL (ref 11.7–15.5)
LYMPHS ABS: 1426 {cells}/uL (ref 850–3900)
Lymphocytes Relative: 31 %
MCH: 29.6 pg (ref 27.0–33.0)
MCHC: 32.4 g/dL (ref 32.0–36.0)
MCV: 91.4 fL (ref 80.0–100.0)
MONO ABS: 414 {cells}/uL (ref 200–950)
MPV: 12.3 fL (ref 7.5–12.5)
Monocytes Relative: 9 %
NEUTROS ABS: 2392 {cells}/uL (ref 1500–7800)
Neutrophils Relative %: 52 %
PLATELETS: 151 10*3/uL (ref 140–400)
RBC: 4.19 MIL/uL (ref 3.80–5.10)
RDW: 14.5 % (ref 11.0–15.0)
WBC: 4.6 10*3/uL (ref 3.8–10.8)

## 2016-05-28 LAB — BASIC METABOLIC PANEL WITH GFR
BUN: 9 mg/dL (ref 7–25)
CHLORIDE: 106 mmol/L (ref 98–110)
CO2: 26 mmol/L (ref 20–31)
Calcium: 10.1 mg/dL (ref 8.6–10.4)
Creat: 0.98 mg/dL (ref 0.50–0.99)
GFR, Est African American: 68 mL/min (ref >=60)
GFR, Est Non African American: 59 mL/min — ABNORMAL LOW (ref >=60)
GLUCOSE: 79 mg/dL (ref 65–99)
POTASSIUM: 4.2 mmol/L (ref 3.5–5.3)
Sodium: 142 mmol/L (ref 135–146)

## 2016-05-28 LAB — HEPATIC FUNCTION PANEL
ALBUMIN: 4 g/dL (ref 3.6–5.1)
ALK PHOS: 84 U/L (ref 33–130)
ALT: 20 U/L (ref 6–29)
AST: 34 U/L (ref 10–35)
BILIRUBIN TOTAL: 0.5 mg/dL (ref 0.2–1.2)
Bilirubin, Direct: 0.1 mg/dL (ref <=0.2)
Indirect Bilirubin: 0.4 mg/dL (ref 0.2–1.2)
Total Protein: 6 g/dL — ABNORMAL LOW (ref 6.1–8.1)

## 2016-05-28 LAB — TSH: TSH: 8.21 mIU/L — ABNORMAL HIGH

## 2016-05-28 LAB — HEMOGLOBIN A1C
HEMOGLOBIN A1C: 5.4 % (ref <5.7)
Mean Plasma Glucose: 108 mg/dL

## 2016-05-28 MED ORDER — ESCITALOPRAM OXALATE 10 MG PO TABS: 10.0000 mg | ORAL_TABLET | Freq: Every day | ORAL | Status: DC

## 2016-05-28 NOTE — Progress Notes (Signed)
Assessment and Plan:  Hypertension:  -Continue medication -monitor blood pressure at home.  -Continue DASH diet.   -Reminder to go to the ER if any CP, SOB, nausea, dizziness, severe HA, changes vision/speech, left arm numbness and tingling, and jaw pain.  Cholesterol: -Continue diet and exercise.  -Check cholesterol.   Pre-diabetes: -Continue diet and exercise.  -Check A1C  Vitamin D Def: -check level -continue medications.   Anxiety/depression -Lexapro 10mg , if this does not help will try wellbutrin - close follow up 1 month  Insomnia Would like to try to get off xanax, can try trazodone or gabapentin  Hypothyroidism, unspecified hypothyroidism type -     TSH -   continue medications the same, reminded to take on an empty stomach 30-67mins before food.   Hyperparathyroidism ([place]) -     BASIC METABOLIC PANEL WITH GFR  Rheumatoid arthritis involving multiple sites, unspecified rheumatoid factor presence ([place])  - Continue follow up with RA  Continue diet and meds as discussed. Further disposition pending results of labs.  HPI 70 y.o. female  presents for 3 month follow up with hypertension, hyperlipidemia, prediabetes and vitamin D.   Her blood pressure has been controlled at home, today their BP is BP: 118/76 mmHg.   She does workout. She denies chest pain, shortness of breath, dizziness.  She reports that she walks 2 miles every morning.   She complains of feeling mad/rage for no reason since her thyroid problem x 2014.   She is on cholesterol medication and denies myalgias. Her cholesterol is at goal. The cholesterol last visit was:   Lab Results  Component Value Date   CHOL 190 02/19/2016   HDL 72 02/19/2016   LDLCALC 102 02/19/2016   TRIG 78 02/19/2016   CHOLHDL 2.6 02/19/2016    She has been working on diet and exercise for prediabetes, and denies foot ulcerations, hyperglycemia, hypoglycemia , increased appetite, nausea, paresthesia of the feet, polydipsia,  polyuria, visual disturbances, vomiting and weight loss. Last A1C in the office was:  Lab Results  Component Value Date   HGBA1C 5.2 02/19/2016   Patient is on Vitamin D supplement.  Lab Results  Component Value Date   VD25OH 77 02/19/2016     She is on thyroid medication. Her medication was not changed last visit.   Lab Results  Component Value Date   TSH 4.45 02/19/2016  .  She is also seeing Dr. Trudie Reed for RA and she does not need [place] to send over her LFTs.  Current Medications:  Current Outpatient Prescriptions on File Prior to Visit  Medication Sig Dispense Refill  . ALPRAZolam (XANAX) 1 MG tablet TAKE 1 TABLET TWICE DAILY AS NEEDED FOR ANXIETY 180 tablet 1  . aspirin 81 MG tablet Take 81 mg by mouth daily.    . Cholecalciferol (VITAMIN D PO) Take 5,000 Units by mouth daily.     . folic acid (FOLVITE) 1 MG tablet Take 3 mg by mouth daily.     . hydroxychloroquine (PLAQUENIL) 200 MG tablet Take by mouth daily. Take 1 tab AM and PM    . ibuprofen (ADVIL,MOTRIN) 200 MG tablet Take 200 mg by mouth every 6 (six) hours as needed. PRN    . levothyroxine (SYNTHROID, LEVOTHROID) 100 MCG tablet Take 100 mcg by mouth daily before breakfast.    . losartan (COZAAR) 50 MG tablet TAKE 1 TABLET DAILY FOR BLOOD PRESSURE 90 tablet 1  . methotrexate (RHEUMATREX) 2.5 MG tablet 25 mg once a week.     Marland Kitchen  omeprazole (PRILOSEC) 40 MG capsule TAKE 1 CAPSULE TWICE DAILY  FOR  ACID  REFLUX 180 capsule 1   No current facility-administered medications on file prior to visit.    Medical History:  Past Medical History  Diagnosis Date  . Hyperlipidemia   . Hypothyroid   . Anemia     Hx of   . H. pylori infection     Hx of   . GERD (gastroesophageal reflux disease)   . PUD (peptic ulcer disease)   . Calculus of bile duct without mention of cholecystitis or obstruction   . Arthritis   . Rheumatoid arthritis(714.0)   . Hypertension   . Cataract   . Heart murmur     Allergies:  Allergies   Allergen Reactions  . Ace Inhibitors Cough  . Ambien [Zolpidem] Other (See Comments)    Odd Feeling  . Crestor [Rosuvastatin]   . Pravastatin   . Prozac [Fluoxetine Hcl] Other (See Comments)    Decreased libido  . Zoloft [Sertraline Hcl]      Review of Systems:  Review of Systems  Constitutional: Negative.   HENT: Negative.   Eyes: Negative.   Respiratory: Negative.   Cardiovascular: Negative.   Gastrointestinal: Negative.   Genitourinary: Negative.   Musculoskeletal: Negative.   Skin: Negative.   Neurological: Negative.   Endo/Heme/Allergies: Negative.   Psychiatric/Behavioral: Negative for depression, suicidal ideas, hallucinations, memory loss and substance abuse. The patient is nervous/anxious and has insomnia.     Family history- Review and unchanged  Social history- Review and unchanged  Physical Exam: BP 118/76 mmHg  Pulse 60  Temp(Src) 98.2 F (36.8 C) (Temporal)  Resp 14  Ht 5\' 2"  (1.575 m)  Wt 154 lb 9.6 oz (70.126 kg)  BMI 28.27 kg/m2  SpO2 99% Wt Readings from Last 3 Encounters:  05/28/16 154 lb 9.6 oz (70.126 kg)  02/19/16 149 lb 9.6 oz (67.858 kg)  09/17/15 148 lb 12.8 oz (67.495 kg)    General Appearance: Well nourished well developed, in no apparent distress. Eyes: PERRLA, EOMs, conjunctiva no swelling or erythema ENT/Mouth: Ear canals normal without obstruction, swelling, erythma, discharge.  TMs normal bilaterally.  Oropharynx moist, clear, without exudate, or postoropharyngeal swelling. Neck: Supple, thyroid normal,no cervical adenopathy  Respiratory: Respiratory effort normal, Breath sounds clear A&P without rhonchi, wheeze, or rale.  No retractions, no accessory usage. Cardio: RRR with no MRGs. Brisk peripheral pulses without edema.  Abdomen: Soft, + BS,  Non tender, no guarding, rebound, hernias, masses. Musculoskeletal: Full ROM, 5/5 strength, Normal gait Skin: Warm, dry without rashes, lesions, ecchymosis.  Neuro: Awake and oriented X  3, Cranial nerves intact. Normal muscle tone, no cerebellar symptoms. Psych: Normal affect, Insight and Judgment appropriate.    Vicie Mutters, PA-C 11:00 AM [facility]

## 2016-05-28 NOTE — Patient Instructions (Signed)
Get on the lexapro for anxiety and hormones x 4 weeks to see if this helps.  If this does not help after 3-4 weeks or if you have any side effects we can stop it or change it.

## 2016-06-02 DIAGNOSIS — Z79899 Other long term (current) drug therapy: Secondary | ICD-10-CM | POA: Diagnosis not present

## 2016-06-02 DIAGNOSIS — M0609 Rheumatoid arthritis without rheumatoid factor, multiple sites: Secondary | ICD-10-CM | POA: Diagnosis not present

## 2016-06-02 DIAGNOSIS — M150 Primary generalized (osteo)arthritis: Secondary | ICD-10-CM | POA: Diagnosis not present

## 2016-06-25 ENCOUNTER — Ambulatory Visit: Payer: Self-pay | Admitting: Physician Assistant

## 2016-07-01 ENCOUNTER — Encounter: Payer: Self-pay | Admitting: Physician Assistant

## 2016-07-01 ENCOUNTER — Encounter: Payer: Self-pay | Admitting: *Deleted

## 2016-07-01 ENCOUNTER — Ambulatory Visit (INDEPENDENT_AMBULATORY_CARE_PROVIDER_SITE_OTHER): Payer: Medicare PPO | Admitting: Physician Assistant

## 2016-07-01 VITALS — BP 142/80 | HR 60 | Temp 97.5°F | Resp 16 | Ht 62.0 in | Wt 151.8 lb

## 2016-07-01 DIAGNOSIS — R7303 Prediabetes: Secondary | ICD-10-CM

## 2016-07-01 DIAGNOSIS — E785 Hyperlipidemia, unspecified: Secondary | ICD-10-CM

## 2016-07-01 DIAGNOSIS — Z6827 Body mass index (BMI) 27.0-27.9, adult: Secondary | ICD-10-CM | POA: Diagnosis not present

## 2016-07-01 DIAGNOSIS — E039 Hypothyroidism, unspecified: Secondary | ICD-10-CM | POA: Diagnosis not present

## 2016-07-01 DIAGNOSIS — Z0000 Encounter for general adult medical examination without abnormal findings: Secondary | ICD-10-CM

## 2016-07-01 DIAGNOSIS — K219 Gastro-esophageal reflux disease without esophagitis: Secondary | ICD-10-CM | POA: Diagnosis not present

## 2016-07-01 DIAGNOSIS — F324 Major depressive disorder, single episode, in partial remission: Secondary | ICD-10-CM

## 2016-07-01 DIAGNOSIS — M069 Rheumatoid arthritis, unspecified: Secondary | ICD-10-CM | POA: Diagnosis not present

## 2016-07-01 DIAGNOSIS — E559 Vitamin D deficiency, unspecified: Secondary | ICD-10-CM | POA: Diagnosis not present

## 2016-07-01 DIAGNOSIS — Z79899 Other long term (current) drug therapy: Secondary | ICD-10-CM | POA: Diagnosis not present

## 2016-07-01 DIAGNOSIS — R6889 Other general symptoms and signs: Secondary | ICD-10-CM

## 2016-07-01 DIAGNOSIS — E213 Hyperparathyroidism, unspecified: Secondary | ICD-10-CM | POA: Diagnosis not present

## 2016-07-01 DIAGNOSIS — Z860100 Personal history of colon polyps, unspecified: Secondary | ICD-10-CM

## 2016-07-01 DIAGNOSIS — Z23 Encounter for immunization: Secondary | ICD-10-CM | POA: Diagnosis not present

## 2016-07-01 DIAGNOSIS — Z0001 Encounter for general adult medical examination with abnormal findings: Secondary | ICD-10-CM

## 2016-07-01 DIAGNOSIS — M810 Age-related osteoporosis without current pathological fracture: Secondary | ICD-10-CM

## 2016-07-01 DIAGNOSIS — I10 Essential (primary) hypertension: Secondary | ICD-10-CM

## 2016-07-01 LAB — BASIC METABOLIC PANEL WITH GFR
BUN: 8 mg/dL (ref 7–25)
CHLORIDE: 100 mmol/L (ref 98–110)
CO2: 27 mmol/L (ref 20–31)
CREATININE: 0.93 mg/dL (ref 0.50–0.99)
Calcium: 10.6 mg/dL — ABNORMAL HIGH (ref 8.6–10.4)
GFR, Est African American: 73 mL/min (ref >=60)
GFR, Est Non African American: 63 mL/min (ref >=60)
GLUCOSE: 87 mg/dL (ref 65–99)
POTASSIUM: 4.5 mmol/L (ref 3.5–5.3)
Sodium: 138 mmol/L (ref 135–146)

## 2016-07-01 LAB — TSH: TSH: 3.07 m[IU]/L

## 2016-07-01 NOTE — Progress Notes (Signed)
MEDICARE ANNUAL WELLNESS VISIT AND FOLLOW UP  Assessment:   HYPERTENSION - CBC with Differential - BASIC METABOLIC PANEL WITH GFR - Hepatic function panel  Gastroesophageal reflux disease without esophagitis Get back on PPI for now  ARTHRITIS, RHEUMATOID Continue medications and rheum follow up  4. Encounter for long-term (current) use of other medications - Magnesium  Hyperlipidemia - Lipid panel   Other abnormal glucose Discussed general issues about diabetes pathophysiology and management., Educational material distributed., Suggested low cholesterol diet., Encouraged aerobic exercise., Discussed foot care., Reminded to get yearly retinal exam. - Hemoglobin A1c - Insulin, fasting  Vitamin D Deficiency - Vit D  25 hydroxy (rtn osteoporosis monitoring)  Unspecified hypothyroidism - TSH - not doing well with levels, and symptoms will swtich from generic to brand name  Hyperparathyroidism ([place]) Monitor calcium  Osteoporosis Off fosamax, check DEXA, continue vitamin D  Personal history of colonic polyps Up to date   BMI 27.0-27.9,adult Obesity with co morbidities- long discussion about weight loss, diet, and exercise  Depression, major, single episode, in partial remission ([place]) Depression- continue medications, stress management techniques discussed, increase water, good sleep hygiene discussed, increase exercise, and increase veggies.   Insomnia She would like to get off xanax for sleep but we will wait to try a new medication  Plan:   During the course of the visit the patient was educated and counseled about appropriate screening and preventive services including:    Pneumococcal vaccine   Influenza vaccine  Td vaccine  Screening electrocardiogram  Screening mammography  Bone densitometry screening  Colorectal cancer screening  Diabetes screening  Glaucoma screening  Nutrition counseling   Advanced directives: given  info/requested  Subjective:   [name] is a 70 y.o. female who presents for Medicare Annual Wellness Visit and 1 month follow up for depression.   Never started lexapro, has had abnormal thyroid and states she feels very labile with her mood and associates this with her thyroid, she is feeling better today, states takes thyroid with only water over 4 hours away from any other meds, interested in getting back on brand name. Marland Kitchen  Her blood pressure has been controlled at home, today their BP is BP: (!) 142/80 She does not workout.  She denies chest pain, shortness of breath, dizziness.  She is on cholesterol medication and denies myalgias. Her cholesterol is at goal. The cholesterol last visit was:   Lab Results  Component Value Date   CHOL 192 05/28/2016   HDL 88 05/28/2016   LDLCALC 92 05/28/2016   TRIG 61 05/28/2016   CHOLHDL 2.2 05/28/2016   She has been working on diet and exercise for prediabetes, and denies increased appetite, nausea, paresthesia of the feet, polydipsia and polyuria. Last A1C in the office was:  Lab Results  Component Value Date   HGBA1C 5.4 05/28/2016   Patient is on Vitamin D supplement. Lab Results  Component Value Date   VD25OH 77 02/19/2016   She is on thyroid medication. Her medication was changed last visit, to 123mcg, 1 pill daily.  Patient denies nervousness, palpitations and weight changes.  Lab Results  Component Value Date   TSH 8.21 (H) 05/28/2016  .  She sees Dr. Trudie Reed regularly and states that her RA is well controlled on plaquenil and Methotrexate.  She is on fosamax for osteoporosis, has been 5 years, has been off.  BMI is Body mass index is 27.76 kg/m., she is working on diet and exercise. Wt Readings from Last 3  Encounters:  07/01/16 151 lb 12.8 oz (68.9 kg)  05/28/16 154 lb 9.6 oz (70.1 kg)  02/19/16 149 lb 9.6 oz (67.9 kg)    Names of Other Physician/Practitioners you currently use: 1.  [facility]- here for primary care 2. Dr. Silvestre Mesi, eye doctor, last visit q 6 months 2016 Patient Care Team: [name], MD as PCP - General (Internal Medicine) Stark Klein, MD as Consulting Physician (General Surgery) Gavin Pound, MD as Consulting Physician (Rheumatology) Lelon Perla, MD as Consulting Physician (Cardiology) Inda Castle, MD as Consulting Physician (Gastroenterology) Starr Lake, MD as Referring Physician (Orthopedic Surgery)  Medication Review Current Outpatient Prescriptions on File Prior to Visit  Medication Sig Dispense Refill  . ALPRAZolam (XANAX) 1 MG tablet TAKE 1 TABLET TWICE DAILY AS NEEDED FOR ANXIETY 180 tablet 1  . aspirin 81 MG tablet Take 81 mg by mouth daily.    . Cholecalciferol (VITAMIN D PO) Take 5,000 Units by mouth daily.     Marland Kitchen escitalopram (LEXAPRO) 10 MG tablet Take 1 tablet (10 mg total) by mouth daily. 30 tablet 2  . folic acid (FOLVITE) 1 MG tablet Take 3 mg by mouth daily.     . hydroxychloroquine (PLAQUENIL) 200 MG tablet Take by mouth daily. Take 1 tab AM and PM    . ibuprofen (ADVIL,MOTRIN) 200 MG tablet Take 200 mg by mouth every 6 (six) hours as needed. PRN    . levothyroxine (SYNTHROID, LEVOTHROID) 100 MCG tablet Take 100 mcg by mouth daily before breakfast.    . losartan (COZAAR) 50 MG tablet TAKE 1 TABLET DAILY FOR BLOOD PRESSURE 90 tablet 1  . methotrexate (RHEUMATREX) 2.5 MG tablet 25 mg once a week.     Marland Kitchen omeprazole (PRILOSEC) 40 MG capsule TAKE 1 CAPSULE TWICE DAILY  FOR  ACID  REFLUX 180 capsule 1   No current facility-administered medications on file prior to visit.     Current Problems (verified) Patient Active Problem List   Diagnosis Date Noted  . BMI 27.0-27.9,adult 09/17/2015  . Hyperparathyroidism ([place]) 07/25/2015  . Hypothyroidism 02/11/2014  . Osteoporosis 02/11/2014  . Vitamin D deficiency 01/22/2014  . Prediabetes 01/22/2014  . Medication management 01/22/2014  . Personal history of  colonic polyps 04/18/2013  . GERD (gastroesophageal reflux disease) 02/28/2013  . Hyperlipidemia 10/07/2011  . Essential hypertension 10/21/2008  . Rheumatoid arthritis ([place]) 10/21/2008    Screening Tests Immunization History  Administered Date(s) Administered  . Influenza, High Dose Seasonal PF 07/16/2014, 09/17/2015, 07/01/2016  . Pneumococcal Conjugate-13 10/16/2015  . Pneumococcal Polysaccharide-23 11/08/2012  . Td 11/08/2012  . Zoster 09/17/2015   Preventative care: Last colonoscopy: 03/2013 Last mammogram: 01/2014 DUE Last pap smear/pelvic exam: remote   DEXA:2014 + osteoporosis put on Fosamax, DUE MRI head 2012 normal Echo 2012 normal US RENAL normal 2013  Prior vaccinations: TD or Tdap: 2014  Influenza: 2016 DUE Prevnar 13: 2016 Pneumococcal: 2014 Shingles/Zostavax: 2016  Allergies Allergies  Allergen Reactions  . Ace Inhibitors Cough  . Ambien [Zolpidem] Other (See Comments)    Odd Feeling  . Crestor [Rosuvastatin]   . Pravastatin   . Prozac [Fluoxetine Hcl] Other (See Comments)    Decreased libido  . 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  FAMILY HISTORY Her family history includes Colon cancer in her maternal grandfather; Heart attack in her brother and father; Lung cancer in her mother; Prostate cancer in her paternal grandfather; Stomach cancer in her father. SOCIAL HISTORY She  reports that she quit smoking about 20 years ago. She has never used smokeless tobacco. She reports that she drinks alcohol. She reports that she does not use drugs.  MEDICARE WELLNESS OBJECTIVES: Physical activity: Current Exercise  Habits: The patient does not participate in regular exercise at present Cardiac risk factors: Cardiac Risk Factors include: advanced age (>56men, >26 women);dyslipidemia;hypertension;sedentary lifestyle Depression/mood screen:   Depression screen [facility] 2/9 07/01/2016  Decreased Interest 0  Down, Depressed, Hopeless 0  PHQ - 2 Score 0    ADLs:  In your present state of health, do you have any difficulty performing the following activities: 07/01/2016 02/19/2016  Hearing? N N  Vision? N N  Difficulty concentrating or making decisions? N N  Walking or climbing stairs? N N  Dressing or bathing? N N  Doing errands, shopping? N N  Some recent data might be hidden    Cognitive Testing  Alert? Yes  Normal Appearance?Yes  Oriented to person? Yes  Place? Yes   Time? Yes  Recall of three objects?  Yes  Can perform simple calculations? Yes  Displays appropriate judgment?Yes  Can read the correct time from a watch face?Yes  EOL planning: Does patient have an advance directive?: No Would patient like information on creating an advanced directive?: Yes - Educational materials given  Objective:   Blood pressure (!) 142/80, pulse 60, temperature 97.5 F (36.4 C), resp. rate 16, height 5\' 2"  (1.575 m), weight 151 lb 12.8 oz (68.9 kg), SpO2 99 %. Body mass index is 27.76 kg/m.  General appearance: alert, no distress, WD/WN,  female HEENT: normocephalic, sclerae anicteric, TMs pearly, nares patent, no discharge or erythema, pharynx normal Oral cavity: MMM, no lesions Neck: supple, no lymphadenopathy, no thyromegaly, no masses Heart: RRR, normal S1, S2, no murmurs Lungs: CTA bilaterally, no wheezes, rhonchi, or rales Abdomen: +bs, soft, nontender, non distended, no masses, no hepatomegaly, no splenomegaly Musculoskeletal: + costochondral tenderness to palpation, no swelling, no obvious deformity Extremities: no edema, no cyanosis, no clubbing Pulses: 2+ symmetric, upper and lower extremities,  normal cap refill Neurological: alert, oriented x 3, CN2-12 intact, strength normal upper extremities and lower extremities, sensation normal throughout, DTRs 2+ throughout, no cerebellar signs, gait normal Psychiatric: normal affect, behavior normal, pleasant  Breast: defer Gyn: defer Rectal: defer  Medicare Attestation I have personally reviewed: The patient's medical and social history Their use of alcohol, tobacco or illicit drugs Their current medications and supplements The patient's functional ability including ADLs,fall risks, home safety risks, cognitive, and hearing and visual impairment Diet and physical activities Evidence for depression or mood disorders  The patient's weight, height, BMI, and visual acuity have been recorded in the chart.  I have made referrals, counseling, and provided education to the patient based on review of the above and I have provided the patient with a written personalized care plan for preventive services.     Vicie Mutters, PA-C   07/01/2016

## 2016-07-01 NOTE — Patient Instructions (Addendum)
Can get synthroid direct is supported by [facility] [employer] in [place], [place]. It is a way to get BRAND NAME synthroid sent directly to your house. I will send a prescription to a mail order pharmacy, you need to call the pharmacy at [phone] or go online to [email or web address] to get started.   Cost:  Pay 25$ for a one month supply Pay 45$ for a two month supply Pay 65$ for a 3 month supply and save 10$   [facility] in [place] Call to check how much BRAND name synthroid [phone]   Try the melatonin 5mg -20mg  dissolvable or gummy 30 mins before bed for sleep   [facility]  7 a.m.-6:30 p.m., Monday 7 a.m.-5 p.m., Tuesday-Friday Schedule an appointment by calling [phone].  Will schedule with DEXA

## 2016-07-02 ENCOUNTER — Other Ambulatory Visit: Payer: Self-pay | Admitting: Physician Assistant

## 2016-07-02 DIAGNOSIS — Z1231 Encounter for screening mammogram for malignant neoplasm of breast: Secondary | ICD-10-CM

## 2016-07-13 ENCOUNTER — Ambulatory Visit
Admission: RE | Admit: 2016-07-13 | Discharge: 2016-07-13 | Disposition: A | Discharge status: Home / Self Care | Payer: Commercial Managed Care - HMO | Source: Ambulatory Visit | Attending: Physician Assistant | Admitting: Physician Assistant

## 2016-07-13 DIAGNOSIS — M810 Age-related osteoporosis without current pathological fracture: Secondary | ICD-10-CM

## 2016-07-13 DIAGNOSIS — Z780 Asymptomatic menopausal state: Secondary | ICD-10-CM | POA: Diagnosis not present

## 2016-07-13 DIAGNOSIS — Z1231 Encounter for screening mammogram for malignant neoplasm of breast: Secondary | ICD-10-CM

## 2016-08-10 ENCOUNTER — Encounter: Payer: Self-pay | Admitting: Physician Assistant

## 2016-08-10 MED ORDER — LEVOTHYROXINE SODIUM 100 MCG PO TABS: 100.0000 ug | ORAL_TABLET | Freq: Every day | ORAL | 1 refills | Status: DC

## 2016-08-11 MED ORDER — LEVOTHYROXINE SODIUM 100 MCG PO TABS: 100.0000 ug | ORAL_TABLET | Freq: Every day | ORAL | 3 refills | Status: DC

## 2016-08-16 ENCOUNTER — Encounter: Payer: Self-pay | Admitting: Physician Assistant

## 2016-08-16 MED ORDER — LEVOTHYROXINE SODIUM 100 MCG PO TABS: 100.0000 ug | ORAL_TABLET | Freq: Every day | ORAL | 3 refills | Status: DC

## 2016-08-16 NOTE — Addendum Note (Signed)
Addended by: Vicie Mutters R on: 08/16/2016 12:18 PM   Modules accepted: Orders

## 2016-08-26 ENCOUNTER — Ambulatory Visit (INDEPENDENT_AMBULATORY_CARE_PROVIDER_SITE_OTHER): Payer: Medicare PPO | Admitting: Internal Medicine

## 2016-08-26 ENCOUNTER — Encounter: Payer: Self-pay | Admitting: Internal Medicine

## 2016-08-26 ENCOUNTER — Other Ambulatory Visit: Payer: Self-pay | Admitting: Internal Medicine

## 2016-08-26 VITALS — BP 124/72 | HR 60 | Temp 97.3°F | Resp 16 | Ht 62.0 in | Wt 152.4 lb

## 2016-08-26 DIAGNOSIS — I10 Essential (primary) hypertension: Secondary | ICD-10-CM | POA: Diagnosis not present

## 2016-08-26 DIAGNOSIS — M069 Rheumatoid arthritis, unspecified: Secondary | ICD-10-CM | POA: Diagnosis not present

## 2016-08-26 DIAGNOSIS — K219 Gastro-esophageal reflux disease without esophagitis: Secondary | ICD-10-CM | POA: Diagnosis not present

## 2016-08-26 DIAGNOSIS — M818 Other osteoporosis without current pathological fracture: Secondary | ICD-10-CM

## 2016-08-26 DIAGNOSIS — Z79899 Other long term (current) drug therapy: Secondary | ICD-10-CM

## 2016-08-26 DIAGNOSIS — R7303 Prediabetes: Secondary | ICD-10-CM | POA: Diagnosis not present

## 2016-08-26 DIAGNOSIS — E559 Vitamin D deficiency, unspecified: Secondary | ICD-10-CM

## 2016-08-26 DIAGNOSIS — E782 Mixed hyperlipidemia: Secondary | ICD-10-CM | POA: Diagnosis not present

## 2016-08-26 LAB — CBC WITH DIFFERENTIAL/PLATELET
BASOS ABS: 51 {cells}/uL (ref 0–200)
BASOS PCT: 1 %
EOS PCT: 7 %
Eosinophils Absolute: 357 cells/uL (ref 15–500)
HCT: 38.2 % (ref 35.0–45.0)
HEMOGLOBIN: 12.6 g/dL (ref 11.7–15.5)
LYMPHS ABS: 1428 {cells}/uL (ref 850–3900)
Lymphocytes Relative: 28 %
MCH: 29.9 pg (ref 27.0–33.0)
MCHC: 33 g/dL (ref 32.0–36.0)
MCV: 90.5 fL (ref 80.0–100.0)
MONOS PCT: 10 %
MPV: 12.3 fL (ref 7.5–12.5)
Monocytes Absolute: 510 cells/uL (ref 200–950)
NEUTROS ABS: 2754 {cells}/uL (ref 1500–7800)
Neutrophils Relative %: 54 %
PLATELETS: 202 10*3/uL (ref 140–400)
RBC: 4.22 MIL/uL (ref 3.80–5.10)
RDW: 14.6 % (ref 11.0–15.0)
WBC: 5.1 10*3/uL (ref 3.8–10.8)

## 2016-08-26 LAB — BASIC METABOLIC PANEL WITH GFR
BUN: 7 mg/dL (ref 7–25)
CALCIUM: 10 mg/dL (ref 8.6–10.4)
CHLORIDE: 104 mmol/L (ref 98–110)
CO2: 28 mmol/L (ref 20–31)
CREATININE: 0.97 mg/dL (ref 0.50–0.99)
GFR, EST NON AFRICAN AMERICAN: 60 mL/min (ref >=60)
GFR, Est African American: 69 mL/min (ref >=60)
Glucose, Bld: 84 mg/dL (ref 65–99)
Potassium: 4.5 mmol/L (ref 3.5–5.3)
SODIUM: 139 mmol/L (ref 135–146)

## 2016-08-26 LAB — LIPID PANEL
CHOL/HDL RATIO: 2.5 ratio (ref <=5.0)
CHOLESTEROL: 198 mg/dL (ref 125–200)
HDL: 79 mg/dL (ref >=46)
LDL Cholesterol: 98 mg/dL (ref <130)
Triglycerides: 104 mg/dL (ref <150)
VLDL: 21 mg/dL (ref <30)

## 2016-08-26 LAB — HEPATIC FUNCTION PANEL
ALT: 22 U/L (ref 6–29)
AST: 37 U/L — AB (ref 10–35)
Albumin: 3.9 g/dL (ref 3.6–5.1)
Alkaline Phosphatase: 91 U/L (ref 33–130)
BILIRUBIN DIRECT: 0.1 mg/dL (ref <=0.2)
BILIRUBIN INDIRECT: 0.4 mg/dL (ref 0.2–1.2)
BILIRUBIN TOTAL: 0.5 mg/dL (ref 0.2–1.2)
Total Protein: 5.9 g/dL — ABNORMAL LOW (ref 6.1–8.1)

## 2016-08-26 LAB — TSH: TSH: 18.68 mIU/L — ABNORMAL HIGH

## 2016-08-26 LAB — MAGNESIUM: MAGNESIUM: 1.8 mg/dL (ref 1.5–2.5)

## 2016-08-26 MED ORDER — ALENDRONATE SODIUM 70 MG PO TABS: ORAL_TABLET | ORAL | 3 refills | Status: DC

## 2016-08-26 NOTE — Patient Instructions (Signed)

## 2016-08-26 NOTE — Progress Notes (Signed)
[facility] [place] [facility] [name], M.D.        [name]. [name], P.A.-C       Starlyn Skeans, P.A.-C  [facility]                [address] [place], [place] [id number] Telephone [phone] Telefax [phone] ______________________________________________________________________     This very nice 70 y.o. MWF presents for 6 month follow up with Hypertension, Hyperlipidemia, GERD, Pre-Diabetes and Vitamin D Deficiency. Patient has Rheumatoid Arthritis predating since 1985 and has been followed by Dr Lenna Gilford.     Patient is treated for HTN (1997) & BP has been controlled at home. Today's BP: 124/72. Patient has had no complaints of any cardiac type chest pain, palpitations, dyspnea/orthopnea/PND, dizziness, claudication, or dependent edema.     Hyperlipidemia is controlled with diet & meds. Patient denies myalgias or other med SE's. Last Lipids were at goal: Lab Results  Component Value Date   CHOL 192 05/28/2016   HDL 88 05/28/2016   LDLCALC 92 05/28/2016   TRIG 61 05/28/2016   CHOLHDL 2.2 05/28/2016      Also, the patient has history of PreDiabetes with A1c 5.8% in 2011 and has had no symptoms of reactive hypoglycemia, diabetic polys, paresthesias or visual blurring.  Last A1c was at goal: Lab Results  Component Value Date   HGBA1C 5.4 05/28/2016      Patient has been on Thyroid replacement for many  years circa 1980's and recently  (last month) was switched to Brand name only Synthroid and is convinced that she can tell a difference Further, the patient also has history of Vitamin D Deficiency ("38" in 2008)  and supplements vitamin D without any suspected side-effects. Last vitamin D was   Lab Results  Component Value Date   VD25OH 77 02/19/2016   Current Outpatient Prescriptions on File Prior to Visit  Medication Sig  . ALPRAZolam (XANAX) 1 MG tablet TAKE 1 TABLET TWICE DAILY AS NEEDED FOR  ANXIETY  . aspirin 81 MG tablet Take 81 mg by mouth daily.  . Cholecalciferol (VITAMIN D PO) Take 5,000 Units by mouth daily.   . folic acid (FOLVITE) 1 MG tablet Take 3 mg by mouth daily.   . hydroxychloroquine (PLAQUENIL) 200 MG tablet Take by mouth daily. Take 1 tab AM and PM  . ibuprofen (ADVIL,MOTRIN) 200 MG tablet Take 200 mg by mouth every 6 (six) hours as needed. PRN  . levothyroxine (SYNTHROID) 100 MCG tablet Take 1 tablet (100 mcg total) by mouth daily.  Marland Kitchen losartan (COZAAR) 50 MG tablet TAKE 1 TABLET DAILY FOR BLOOD PRESSURE  . methotrexate (RHEUMATREX) 2.5 MG tablet 25 mg once a week.   Marland Kitchen omeprazole (PRILOSEC) 40 MG capsule TAKE 1 CAPSULE TWICE DAILY  FOR  ACID  REFLUX   No current facility-administered medications on file prior to visit.    Allergies  Allergen Reactions  . Ace Inhibitors Cough  . Ambien [Zolpidem] Other (See Comments)    Odd Feeling  . Crestor [Rosuvastatin]   . Pravastatin   . Prozac [Fluoxetine Hcl] Other (See Comments)    Decreased libido  . Zoloft [Sertraline Hcl]    PMHx:   Past Medical History:  Diagnosis Date  . Anemia    Hx of   . Arthritis   . Calculus of bile duct without mention of cholecystitis or obstruction   .  Cataract   . GERD (gastroesophageal reflux disease)   . H. pylori infection    Hx of   . Heart murmur   . Hyperlipidemia   . Hypertension   . Hypothyroid   . PUD (peptic ulcer disease)   . Rheumatoid arthritis(714.0)    Immunization History  Administered Date(s) Administered  . Influenza, High Dose Seasonal PF 07/16/2014, 09/17/2015, 07/01/2016  . Pneumococcal Conjugate-13 10/16/2015  . Pneumococcal Polysaccharide-23 11/08/2012  . Td 11/08/2012  . Zoster 09/17/2015   Past Surgical History:  Procedure Laterality Date  . AV FISTULA REPAIR    . BLADDER SUSPENSION    . BUNIONECTOMY    . CARPAL TUNNEL RELEASE    . CHOLECYSTECTOMY  09/07/2011  . CYSTECTOMY     left breast  . ERCP  09/29/2011   Procedure:  ENDOSCOPIC RETROGRADE CHOLANGIOPANCREATOGRAPHY (ERCP);  Surgeon: Inda Castle, MD;  Location: [facility];  Service: Endoscopy;  Laterality: N/A;  . PILONIDAL CYST EXCISION    . TENOLYSIS  10/26/2011   Procedure: TENDON SHEATH RELEASE/TENOLYSIS;  Surgeon: Wynonia Sours, MD;  Location: [facility];  Service: Orthopedics;  Laterality: Right;  tenosynovectomy removal superficialis slip right index finger  . TONSILLECTOMY    . TOTAL SHOULDER REPLACEMENT Right 02/22/2014   Dr. Enrigue Catena at [facility]  . TRIGGER FINGER RELEASE    . VAGINAL HYSTERECTOMY     ovaries not removed   FHx:    Reviewed / unchanged  SHx:    Reviewed / unchanged  Systems Review:  Constitutional: Denies fever, chills, wt changes, headaches, insomnia, fatigue, night sweats, change in appetite. Eyes: Denies redness, blurred vision, diplopia, discharge, itchy, watery eyes.  ENT: Denies discharge, congestion, post nasal drip, epistaxis, sore throat, earache, hearing loss, dental pain, tinnitus, vertigo, sinus pain, snoring.  CV: Denies chest pain, palpitations, irregular heartbeat, syncope, dyspnea, diaphoresis, orthopnea, PND, claudication or edema. Respiratory: denies cough, dyspnea, DOE, pleurisy, hoarseness, laryngitis, wheezing.  Gastrointestinal: Denies dysphagia, odynophagia, heartburn, reflux, water brash, abdominal pain or cramps, nausea, vomiting, bloating, diarrhea, constipation, hematemesis, melena, hematochezia  or hemorrhoids. Genitourinary: Denies dysuria, frequency, urgency, nocturia, hesitancy, discharge, hematuria or flank pain. Musculoskeletal: Denies arthralgias, myalgias, stiffness, jt. swelling, pain, limping or strain/sprain.  Skin: Denies pruritus, rash, hives, warts, acne, eczema or change in skin lesion(s). Neuro: No weakness, tremor, incoordination, spasms, paresthesia or pain. Psychiatric: Denies confusion, memory loss or sensory loss. Endo: Denies change in weight, skin or hair change.   Heme/Lymph: No excessive bleeding, bruising or enlarged lymph nodes.  Physical Exam BP 124/72   Pulse 60   Temp 97.3 F (36.3 C)   Resp 16   Ht 5\' 2"  (1.575 m)   Wt 152 lb 6.4 oz (69.1 kg)   BMI 27.87 kg/m   Appears well nourished and in no distress.  Eyes: PERRLA, EOMs, conjunctiva no swelling or erythema. Sinuses: No frontal/maxillary tenderness ENT/Mouth: EAC's clear, TM's nl w/o erythema, bulging. Nares clear w/o erythema, swelling, exudates. Oropharynx clear without erythema or exudates. Oral hygiene is good. Tongue normal, non obstructing. Hearing intact.  Neck: Supple. Thyroid nl. Car 2+/2+ without bruits, nodes or JVD. Chest: Respirations nl with BS clear & equal w/o rales, rhonchi, wheezing or stridor.  Cor: Heart sounds normal w/ regular rate and rhythm without sig. murmurs, gallops, clicks, or rubs. Peripheral pulses normal and equal  without edema.  Abdomen: Soft & bowel sounds normal. Non-tender w/o guarding, rebound, hernias, masses, or organomegaly.  Lymphatics: Unremarkable.  Musculoskeletal: Full ROM all peripheral extremities,  joint stability, 5/5 strength, and normal gait.  Skin: Warm, dry without exposed rashes, lesions or ecchymosis apparent.  Neuro: Cranial nerves intact, reflexes equal bilaterally. Sensory-motor testing grossly intact. Tendon reflexes grossly intact.  Pysch: Alert & oriented x 3.  Insight and judgement nl & appropriate. No ideations.  Assessment and Plan:   1. Essential hypertension  - Continue medication, monitor blood pressure at home.  - Continue DASH diet. Reminder to go to the ER if any CP, SOB, nausea, dizziness, severe HA, changes vision/speech, left arm numbness and tingling and jaw pain. - TSH  2. Mixed hyperlipidemia  - Continue diet/meds, exercise,& lifestyle modifications.  - Continue monitor periodic cholesterol/liver & renal functions  - Lipid panel - TSH  3. Prediabetes  - Continue diet, exercise, lifestyle  modifications. Monitor appropriate labs. - Hemoglobin A1c - Insulin, random  4. Vitamin D deficiency  - Continue supplementation. - VITAMIN D 25 Hydroxy (Vit-D Deficiency, Fractures)  5. Gastroesophageal reflux disease without esophagitis   6. Rheumatoid arthritis involving multiple sites, unspecified rheumatoid factor presence ([place])   7. Medication management  - CBC with Differential/Platelet - BASIC METABOLIC PANEL WITH GFR - Hepatic function panel - Magnesium       Recommended regular exercise, BP monitoring, weight control, and discussed med and SE's. Recommended labs to assess and monitor clinical status. Further disposition pending results of labs. Over 30 minutes of exam, counseling, chart review was performed

## 2016-08-27 LAB — VITAMIN D 25 HYDROXY (VIT D DEFICIENCY, FRACTURES): VIT D 25 HYDROXY: 64 ng/mL (ref 30–100)

## 2016-08-27 LAB — HEMOGLOBIN A1C
Hgb A1c MFr Bld: 5.1 % (ref <5.7)
Mean Plasma Glucose: 100 mg/dL

## 2016-08-27 LAB — INSULIN, RANDOM: Insulin: 9.4 u[IU]/mL (ref 2.0–19.6)

## 2016-09-02 DIAGNOSIS — M150 Primary generalized (osteo)arthritis: Secondary | ICD-10-CM | POA: Diagnosis not present

## 2016-09-02 DIAGNOSIS — Z79899 Other long term (current) drug therapy: Secondary | ICD-10-CM | POA: Diagnosis not present

## 2016-09-02 DIAGNOSIS — M0609 Rheumatoid arthritis without rheumatoid factor, multiple sites: Secondary | ICD-10-CM | POA: Diagnosis not present

## 2016-09-20 ENCOUNTER — Other Ambulatory Visit: Payer: Self-pay | Admitting: Internal Medicine

## 2016-09-20 DIAGNOSIS — F411 Generalized anxiety disorder: Secondary | ICD-10-CM

## 2016-09-21 ENCOUNTER — Ambulatory Visit: Payer: Medicare PPO

## 2016-09-21 DIAGNOSIS — E039 Hypothyroidism, unspecified: Secondary | ICD-10-CM | POA: Diagnosis not present

## 2016-09-22 ENCOUNTER — Other Ambulatory Visit: Payer: Self-pay | Admitting: Internal Medicine

## 2016-09-22 DIAGNOSIS — E039 Hypothyroidism, unspecified: Secondary | ICD-10-CM

## 2016-09-22 LAB — TSH: TSH: 8.3 mIU/L — ABNORMAL HIGH

## 2016-10-21 ENCOUNTER — Ambulatory Visit (INDEPENDENT_AMBULATORY_CARE_PROVIDER_SITE_OTHER): Payer: Medicare PPO | Admitting: *Deleted

## 2016-10-21 DIAGNOSIS — E039 Hypothyroidism, unspecified: Secondary | ICD-10-CM | POA: Diagnosis not present

## 2016-10-21 LAB — TSH: TSH: 12.8 m[IU]/L — AB

## 2016-10-21 NOTE — Progress Notes (Signed)
Patient presents for recheck TSH.  States she is currently taking levothyroxine 100 mcg 1 pill QD.

## 2016-10-27 ENCOUNTER — Encounter: Payer: Self-pay | Admitting: Physician Assistant

## 2016-10-27 ENCOUNTER — Other Ambulatory Visit: Payer: Self-pay | Admitting: Physician Assistant

## 2016-10-27 MED ORDER — LEVOTHYROXINE SODIUM 112 MCG PO TABS: 112.0000 ug | ORAL_TABLET | Freq: Every day | ORAL | 3 refills | Status: DC

## 2016-12-03 ENCOUNTER — Ambulatory Visit: Payer: Self-pay | Admitting: Physician Assistant

## 2016-12-03 DIAGNOSIS — M0609 Rheumatoid arthritis without rheumatoid factor, multiple sites: Secondary | ICD-10-CM | POA: Diagnosis not present

## 2016-12-03 DIAGNOSIS — E663 Overweight: Secondary | ICD-10-CM | POA: Diagnosis not present

## 2016-12-03 DIAGNOSIS — M2550 Pain in unspecified joint: Secondary | ICD-10-CM | POA: Diagnosis not present

## 2016-12-03 DIAGNOSIS — Z79899 Other long term (current) drug therapy: Secondary | ICD-10-CM | POA: Diagnosis not present

## 2016-12-03 DIAGNOSIS — Z6828 Body mass index (BMI) 28.0-28.9, adult: Secondary | ICD-10-CM | POA: Diagnosis not present

## 2016-12-03 DIAGNOSIS — M150 Primary generalized (osteo)arthritis: Secondary | ICD-10-CM | POA: Diagnosis not present

## 2016-12-16 ENCOUNTER — Ambulatory Visit (INDEPENDENT_AMBULATORY_CARE_PROVIDER_SITE_OTHER): Payer: Medicare PPO | Admitting: Physician Assistant

## 2016-12-16 ENCOUNTER — Encounter: Payer: Self-pay | Admitting: Physician Assistant

## 2016-12-16 VITALS — BP 110/80 | HR 66 | Temp 97.9°F | Resp 14 | Ht 62.0 in | Wt 159.4 lb

## 2016-12-16 DIAGNOSIS — E559 Vitamin D deficiency, unspecified: Secondary | ICD-10-CM | POA: Diagnosis not present

## 2016-12-16 DIAGNOSIS — I10 Essential (primary) hypertension: Secondary | ICD-10-CM | POA: Diagnosis not present

## 2016-12-16 DIAGNOSIS — R7303 Prediabetes: Secondary | ICD-10-CM

## 2016-12-16 DIAGNOSIS — E782 Mixed hyperlipidemia: Secondary | ICD-10-CM

## 2016-12-16 DIAGNOSIS — E039 Hypothyroidism, unspecified: Secondary | ICD-10-CM

## 2016-12-16 DIAGNOSIS — Z79899 Other long term (current) drug therapy: Secondary | ICD-10-CM | POA: Diagnosis not present

## 2016-12-16 DIAGNOSIS — M069 Rheumatoid arthritis, unspecified: Secondary | ICD-10-CM

## 2016-12-16 LAB — CBC WITH DIFFERENTIAL/PLATELET
BASOS PCT: 1 %
Basophils Absolute: 42 cells/uL (ref 0–200)
Eosinophils Absolute: 378 cells/uL (ref 15–500)
Eosinophils Relative: 9 %
HCT: 38.3 % (ref 35.0–45.0)
Hemoglobin: 12.4 g/dL (ref 11.7–15.5)
LYMPHS PCT: 30 %
Lymphs Abs: 1260 cells/uL (ref 850–3900)
MCH: 29.5 pg (ref 27.0–33.0)
MCHC: 32.4 g/dL (ref 32.0–36.0)
MCV: 91 fL (ref 80.0–100.0)
MONOS PCT: 15 %
MPV: 11.6 fL (ref 7.5–12.5)
Monocytes Absolute: 630 cells/uL (ref 200–950)
Neutro Abs: 1890 cells/uL (ref 1500–7800)
Neutrophils Relative %: 45 %
PLATELETS: 177 10*3/uL (ref 140–400)
RBC: 4.21 MIL/uL (ref 3.80–5.10)
RDW: 14.8 % (ref 11.0–15.0)
WBC: 4.2 10*3/uL (ref 3.8–10.8)

## 2016-12-16 LAB — HEPATIC FUNCTION PANEL
ALBUMIN: 4.2 g/dL (ref 3.6–5.1)
ALK PHOS: 83 U/L (ref 33–130)
ALT: 23 U/L (ref 6–29)
AST: 34 U/L (ref 10–35)
BILIRUBIN INDIRECT: 0.4 mg/dL (ref 0.2–1.2)
BILIRUBIN TOTAL: 0.5 mg/dL (ref 0.2–1.2)
Bilirubin, Direct: 0.1 mg/dL (ref <=0.2)
Total Protein: 6.4 g/dL (ref 6.1–8.1)

## 2016-12-16 LAB — BASIC METABOLIC PANEL WITH GFR
BUN: 6 mg/dL — AB (ref 7–25)
CALCIUM: 10.5 mg/dL — AB (ref 8.6–10.4)
CO2: 27 mmol/L (ref 20–31)
Chloride: 107 mmol/L (ref 98–110)
Creat: 0.94 mg/dL (ref 0.50–0.99)
GFR, EST AFRICAN AMERICAN: 72 mL/min (ref >=60)
GFR, EST NON AFRICAN AMERICAN: 62 mL/min (ref >=60)
Glucose, Bld: 85 mg/dL (ref 65–99)
POTASSIUM: 4.6 mmol/L (ref 3.5–5.3)
Sodium: 143 mmol/L (ref 135–146)

## 2016-12-16 LAB — T4, FREE: FREE T4: 1.4 ng/dL (ref 0.8–1.8)

## 2016-12-16 LAB — TSH: TSH: 2.07 mIU/L

## 2016-12-16 LAB — LIPID PANEL
CHOLESTEROL: 195 mg/dL (ref <200)
HDL: 94 mg/dL (ref >50)
LDL CALC: 86 mg/dL (ref <100)
TRIGLYCERIDES: 76 mg/dL (ref <150)
Total CHOL/HDL Ratio: 2.1 Ratio (ref <5.0)
VLDL: 15 mg/dL (ref <30)

## 2016-12-16 LAB — T3, FREE: T3, Free: 2.5 pg/mL (ref 2.3–4.2)

## 2016-12-16 LAB — MAGNESIUM: Magnesium: 2.2 mg/dL (ref 1.5–2.5)

## 2016-12-16 LAB — T3 UPTAKE: T3 UPTAKE: 31 % (ref 22–35)

## 2016-12-16 MED ORDER — TRAZODONE HCL 50 MG PO TABS: 50.0000 mg | ORAL_TABLET | Freq: Every evening | ORAL | 2 refills | Status: DC | PRN

## 2016-12-16 NOTE — Progress Notes (Signed)
Assessment and Plan:  Hypertension:  -Continue medication -monitor blood pressure at home.  -Continue DASH diet.   -Reminder to go to the ER if any CP, SOB, nausea, dizziness, severe HA, changes vision/speech, left arm numbness and tingling, and jaw pain.  Cholesterol: -Continue diet and exercise.  -Check cholesterol.   Pre-diabetes: -Continue diet and exercise.  -Check A1C  Vitamin D Def: -check level -continue medications.   Anxiety/depression declines  Insomnia Will try trazodone  Hypothyroidism, unspecified hypothyroidism type -     TSH -   takes medication well, still with fluctuations, will check labs and she has set up appointment with Dr. Buddy Duty  Hyperparathyroidism [facility]) -     BASIC METABOLIC PANEL WITH GFR  Rheumatoid arthritis involving multiple sites, unspecified rheumatoid factor presence ([place])  - Continue follow up with RA  Presycope Has had normal holter, normal stress test, normal MRI, no prodrome,no postictal state, did not eat prior, last episode 1 year ago, will check labs, and encouraged patient eat small frequent meals, if another episodes happens go to the ER  Continue diet and meds as discussed. Further disposition pending results of labs. Future Appointments Date Time Provider [place]  03/08/2017 9:00 AM [name], MD [facility] None    HPI 71 y.o. female  presents for 3 month follow up with hypertension, hyperlipidemia, prediabetes and vitamin D.   Her blood pressure has been controlled at home, today their BP is BP: 110/80.   She does workout. She denies chest pain, shortness of breath, dizziness.  She reports that she walks 2 miles every morning.   She did have an episode of near syncope/syncope while walking around at [facility], she did not feel funny before she passed out thought states something similar happened with dizziness prior to it a year ago, possible LOC x 20 sec, did not hit head, did not lose bladder bowel/control, no  confusion afterwards. Normal stress test 07/2015, echo 2012, MRI brain 2012. Did not eat that day, no nausea, SOB, CP, dizziness with this episode.  She complains of feeling mad/rage for no reason since her thyroid problem x 2014, she has been extra fatigued, states will good for a few days and then she will feel like "shit" and have no motivation. She is on the brand name but states that TSH is still abnormal, she takes the thyroid medication at 4AM and other medications at 8AM or 8PM well away from 4 hours of her medication.  Lab Results  Component Value Date   TSH 12.80 (H) 10/21/2016    She is on cholesterol medication and denies myalgias. Her cholesterol is at goal. The cholesterol last visit was:   Lab Results  Component Value Date   CHOL 198 08/26/2016   HDL 79 08/26/2016   LDLCALC 98 08/26/2016   TRIG 104 08/26/2016   CHOLHDL 2.5 08/26/2016    She has been working on diet and exercise for prediabetes, and denies foot ulcerations, hyperglycemia, hypoglycemia , increased appetite, nausea, paresthesia of the feet, polydipsia, polyuria, visual disturbances, vomiting and weight loss. Last A1C in the office was:  Lab Results  Component Value Date   HGBA1C 5.1 08/26/2016   Patient is on Vitamin D supplement.  Lab Results  Component Value Date   VD25OH 64 08/26/2016     She is also seeing Dr. Trudie Reed for RA and she does not need [place] to send over her LFTs.  Current Medications:  Current Outpatient Prescriptions on File Prior to Visit  Medication  Sig Dispense Refill  . alendronate (FOSAMAX) 70 MG tablet Take 1 tablet once weekly with a full glass of water on an empty stomach for 30 minutes. 12 tablet 3  . ALPRAZolam (XANAX) 1 MG tablet TAKE 1 TABLET TWICE DAILY AS NEEDED FOR ANXIETY 180 tablet 1  . aspirin 81 MG tablet Take 81 mg by mouth daily.    . Cholecalciferol (VITAMIN D PO) Take 5,000 Units by mouth daily.     . folic acid (FOLVITE) 1 MG tablet Take 3 mg by mouth daily.     .  hydroxychloroquine (PLAQUENIL) 200 MG tablet Take by mouth daily. Take 1 tab AM and PM    . ibuprofen (ADVIL,MOTRIN) 200 MG tablet Take 200 mg by mouth every 6 (six) hours as needed. PRN    . levothyroxine (SYNTHROID, LEVOTHROID) 112 MCG tablet Take 1 tablet (112 mcg total) by mouth daily before breakfast. 90 tablet 3  . losartan (COZAAR) 50 MG tablet TAKE 1 TABLET DAILY FOR BLOOD PRESSURE 90 tablet 1  . methotrexate (RHEUMATREX) 2.5 MG tablet 25 mg once a week.     Marland Kitchen omeprazole (PRILOSEC) 40 MG capsule TAKE 1 CAPSULE TWICE DAILY  FOR  ACID  REFLUX 180 capsule 1   No current facility-administered medications on file prior to visit.     Medical History:  Past Medical History:  Diagnosis Date  . Anemia    Hx of   . Arthritis   . Calculus of bile duct without mention of cholecystitis or obstruction   . Cataract   . GERD (gastroesophageal reflux disease)   . H. pylori infection    Hx of   . Heart murmur   . Hyperlipidemia   . Hypertension   . Hypothyroid   . PUD (peptic ulcer disease)   . Rheumatoid arthritis(714.0)     Allergies:  Allergies  Allergen Reactions  . Ace Inhibitors Cough  . Ambien [Zolpidem] Other (See Comments)    Odd Feeling  . Crestor [Rosuvastatin]   . Pravastatin   . Prozac [Fluoxetine Hcl] Other (See Comments)    Decreased libido  . Zoloft [Sertraline Hcl]      Review of Systems:  Review of Systems  Constitutional: Negative.   HENT: Negative.   Eyes: Negative.   Respiratory: Negative.   Cardiovascular: Negative.   Gastrointestinal: Negative.   Genitourinary: Negative.   Musculoskeletal: Negative.   Skin: Negative.   Neurological: Negative.   Endo/Heme/Allergies: Negative.   Psychiatric/Behavioral: Negative for depression, hallucinations, memory loss, substance abuse and suicidal ideas. The patient is nervous/anxious and has insomnia.     Family history- Review and unchanged  Social history- Review and unchanged  Physical Exam: BP 110/80    Pulse 66   Temp 97.9 F (36.6 C)   Resp 14   Ht 5\' 2"  (1.575 m)   Wt 159 lb 6.4 oz (72.3 kg)   SpO2 99%   BMI 29.15 kg/m  Wt Readings from Last 3 Encounters:  12/16/16 159 lb 6.4 oz (72.3 kg)  08/26/16 152 lb 6.4 oz (69.1 kg)  07/01/16 151 lb 12.8 oz (68.9 kg)    General Appearance: Well nourished well developed, in no apparent distress. Eyes: PERRLA, EOMs, conjunctiva no swelling or erythema ENT/Mouth: Ear canals normal without obstruction, swelling, erythma, discharge.  TMs normal bilaterally.  Oropharynx moist, clear, without exudate, or postoropharyngeal swelling. Neck: Supple, thyroid normal,no cervical adenopathy  Respiratory: Respiratory effort normal, Breath sounds clear A&P without rhonchi, wheeze, or rale.  No retractions,  no accessory usage. Cardio: RRR with 2/6 LSB systolic murmur.  Brisk peripheral pulses without edema.  Abdomen: Soft, + BS,  Non tender, no guarding, rebound, hernias, masses. Musculoskeletal: Full ROM, 5/5 strength, Normal gait Skin: Warm, dry without rashes, lesions, ecchymosis.  Neuro: Awake and oriented X 3, Cranial nerves intact. Normal muscle tone, no cerebellar symptoms. Psych: Normal affect, Insight and Judgment appropriate.    Vicie Mutters, PA-C 9:42 AM [facility]

## 2016-12-16 NOTE — Patient Instructions (Addendum)
Try the trazodone 50mg  Start 1/2 at night with 1/2 of the xanax x 2-5 days Then do 1 pill of the trazodone with 1/2 of the xanax x 2-5 days  At this time we have options depending on how you are doing You can stay on the this dose Can go up to 100mg  of the trazodone or 2 of the pills and can stay on 1/2 or try none of the xanax at this time  You can always have the xanax to take AS NEEDED for anxiety or for sleep but our goal is to decrease use.    Near-Syncope Introduction Near-syncope is when you suddenly become weak or dizzy, or you feel like you might pass out (faint). During an episode of near-syncope, you may:  Feel dizzy or light-headed.  Feel nauseous.  See all white or all black in your field of vision.  Have cold, clammy skin. This condition is caused by a sudden decrease in blood flow to the brain. This decrease can result from various causes, but most of those causes are not dangerous. However, near-syncope can be a sign of a serious medical problem, so it is important to seek medical care. If you fainted, get medical help right away.Call your local emergency services (911 in the [place] Do not drive yourself to the hospital. Follow these instructions at home: Pay attention to any changes in your symptoms. Take these actions to help with your condition:  Have someone stay with you until you feel stable.  Do not drive, use machinery, or play sports until your health care provider says it is okay.  Keep all follow-up visits as told by your health care provider. This is important.  If you start to feel like you might faint, lie down right away and raise (elevate) your feet above the level of your heart. Breathe deeply and steadily. Wait until all of the symptoms have passed.  Drink enough fluid to keep your urine clear or pale yellow.  If you are taking blood pressure or heart medicine, get up slowly and take several minutes to sit and then stand. This can reduce  dizziness.  Take over-the-counter and prescription medicines only as told by your health care provider. Get help right away if:  You have a severe headache.  You have unusual pain in your chest, abdomen, or back.  You are bleeding from your mouth or rectum, or you have black or tarry stool.  You have a very fast or irregular heartbeat (palpitations).  You faint once or repeatedly.  You have a seizure.  You are confused.  You have trouble walking.  You have severe weakness.  You have vision problems. These symptoms may represent a serious problem that is an emergency. Do not wait to see if your symptoms will go away. Get medical help right away. Call your local emergency services (911 in the [place] Do not drive yourself to the hospital.  This information is not intended to replace advice given to you by your health care provider. Make sure you discuss any questions you have with your health care provider. Document Released: 10/25/2005 Document Revised: 04/01/2016 Document Reviewed: 07/09/2015  2017 [employer]

## 2016-12-17 LAB — VITAMIN D 25 HYDROXY (VIT D DEFICIENCY, FRACTURES): VIT D 25 HYDROXY: 81 ng/mL (ref 30–100)

## 2016-12-17 LAB — HEMOGLOBIN A1C
Hgb A1c MFr Bld: 5 % (ref <5.7)
Mean Plasma Glucose: 97 mg/dL

## 2017-01-06 ENCOUNTER — Encounter: Payer: Self-pay | Admitting: Physician Assistant

## 2017-01-06 MED ORDER — LEVOTHYROXINE SODIUM 112 MCG PO TABS: 112.0000 ug | ORAL_TABLET | Freq: Every day | ORAL | 3 refills | Status: DC

## 2017-03-03 DIAGNOSIS — R5383 Other fatigue: Secondary | ICD-10-CM | POA: Diagnosis not present

## 2017-03-03 DIAGNOSIS — Z6827 Body mass index (BMI) 27.0-27.9, adult: Secondary | ICD-10-CM | POA: Diagnosis not present

## 2017-03-03 DIAGNOSIS — E663 Overweight: Secondary | ICD-10-CM | POA: Diagnosis not present

## 2017-03-03 DIAGNOSIS — M0609 Rheumatoid arthritis without rheumatoid factor, multiple sites: Secondary | ICD-10-CM | POA: Diagnosis not present

## 2017-03-03 DIAGNOSIS — M150 Primary generalized (osteo)arthritis: Secondary | ICD-10-CM | POA: Diagnosis not present

## 2017-03-03 DIAGNOSIS — Z79899 Other long term (current) drug therapy: Secondary | ICD-10-CM | POA: Diagnosis not present

## 2017-03-03 DIAGNOSIS — M2550 Pain in unspecified joint: Secondary | ICD-10-CM | POA: Diagnosis not present

## 2017-03-04 ENCOUNTER — Other Ambulatory Visit: Payer: Self-pay | Admitting: Internal Medicine

## 2017-03-08 ENCOUNTER — Encounter: Payer: Self-pay | Admitting: Internal Medicine

## 2017-03-23 ENCOUNTER — Encounter: Payer: Self-pay | Admitting: Physician Assistant

## 2017-04-12 ENCOUNTER — Encounter: Payer: Self-pay | Admitting: Internal Medicine

## 2017-04-13 ENCOUNTER — Encounter: Payer: Self-pay | Admitting: Internal Medicine

## 2017-04-13 ENCOUNTER — Ambulatory Visit (INDEPENDENT_AMBULATORY_CARE_PROVIDER_SITE_OTHER): Payer: Medicare PPO | Admitting: Internal Medicine

## 2017-04-13 VITALS — BP 134/76 | HR 60 | Temp 97.5°F | Resp 16 | Ht 62.0 in | Wt 152.6 lb

## 2017-04-13 DIAGNOSIS — Z0000 Encounter for general adult medical examination without abnormal findings: Secondary | ICD-10-CM

## 2017-04-13 DIAGNOSIS — E559 Vitamin D deficiency, unspecified: Secondary | ICD-10-CM

## 2017-04-13 DIAGNOSIS — I10 Essential (primary) hypertension: Secondary | ICD-10-CM | POA: Diagnosis not present

## 2017-04-13 DIAGNOSIS — E782 Mixed hyperlipidemia: Secondary | ICD-10-CM

## 2017-04-13 DIAGNOSIS — M069 Rheumatoid arthritis, unspecified: Secondary | ICD-10-CM

## 2017-04-13 DIAGNOSIS — Z79899 Other long term (current) drug therapy: Secondary | ICD-10-CM

## 2017-04-13 DIAGNOSIS — Z1212 Encounter for screening for malignant neoplasm of rectum: Secondary | ICD-10-CM

## 2017-04-13 DIAGNOSIS — R7303 Prediabetes: Secondary | ICD-10-CM

## 2017-04-13 DIAGNOSIS — E039 Hypothyroidism, unspecified: Secondary | ICD-10-CM | POA: Diagnosis not present

## 2017-04-13 DIAGNOSIS — Z0001 Encounter for general adult medical examination with abnormal findings: Secondary | ICD-10-CM

## 2017-04-13 DIAGNOSIS — Z136 Encounter for screening for cardiovascular disorders: Secondary | ICD-10-CM

## 2017-04-13 DIAGNOSIS — K219 Gastro-esophageal reflux disease without esophagitis: Secondary | ICD-10-CM

## 2017-04-13 LAB — BASIC METABOLIC PANEL WITHOUT GFR
BUN: 9 mg/dL (ref 7–25)
CO2: 27 mmol/L (ref 20–31)
Calcium: 10.2 mg/dL (ref 8.6–10.4)
Chloride: 106 mmol/L (ref 98–110)
Creat: 0.94 mg/dL (ref 0.50–0.99)
GFR, Est African American: 72 mL/min
GFR, Est Non African American: 62 mL/min
Glucose, Bld: 87 mg/dL (ref 65–99)
Potassium: 4.5 mmol/L (ref 3.5–5.3)
Sodium: 139 mmol/L (ref 135–146)

## 2017-04-13 LAB — LIPID PANEL
Cholesterol: 175 mg/dL
HDL: 81 mg/dL
LDL Cholesterol: 81 mg/dL
Total CHOL/HDL Ratio: 2.2 ratio
Triglycerides: 63 mg/dL
VLDL: 13 mg/dL

## 2017-04-13 LAB — HEPATIC FUNCTION PANEL
ALK PHOS: 74 U/L (ref 33–130)
ALT: 21 U/L (ref 6–29)
AST: 32 U/L (ref 10–35)
Albumin: 4.1 g/dL (ref 3.6–5.1)
BILIRUBIN INDIRECT: 0.5 mg/dL (ref 0.2–1.2)
Bilirubin, Direct: 0.2 mg/dL (ref <=0.2)
TOTAL PROTEIN: 6.2 g/dL (ref 6.1–8.1)
Total Bilirubin: 0.7 mg/dL (ref 0.2–1.2)

## 2017-04-13 LAB — CBC WITH DIFFERENTIAL/PLATELET
BASOS ABS: 40 {cells}/uL (ref 0–200)
Basophils Relative: 1 %
EOS PCT: 9 %
Eosinophils Absolute: 360 cells/uL (ref 15–500)
HCT: 38.8 % (ref 35.0–45.0)
Hemoglobin: 12.6 g/dL (ref 11.7–15.5)
Lymphocytes Relative: 31 %
Lymphs Abs: 1240 cells/uL (ref 850–3900)
MCH: 29.4 pg (ref 27.0–33.0)
MCHC: 32.5 g/dL (ref 32.0–36.0)
MCV: 90.4 fL (ref 80.0–100.0)
MONOS PCT: 11 %
MPV: 11.7 fL (ref 7.5–12.5)
Monocytes Absolute: 440 cells/uL (ref 200–950)
NEUTROS ABS: 1920 {cells}/uL (ref 1500–7800)
NEUTROS PCT: 48 %
PLATELETS: 169 10*3/uL (ref 140–400)
RBC: 4.29 MIL/uL (ref 3.80–5.10)
RDW: 15.9 % — AB (ref 11.0–15.0)
WBC: 4 10*3/uL (ref 3.8–10.8)

## 2017-04-13 NOTE — Progress Notes (Signed)
[facility] [name], M.D.      [name]. [name], P.A.-C [facility]                [address] [place], [place] [address] Telephone [phone] Telefax [phone]  Annual Screening/Preventative Visit & Comprehensive Evaluation &  Examination     This very nice 72 y.o. MWF presents for a Screening/Preventative Visit & comprehensive evaluation and management of multiple medical co-morbidities.  Patient has been followed for HTN, Prediabetes, Hyperlipidemia and Vitamin D Deficiency. Patient has hx/o Rheumatoid Arthritis (1985) and is followed by Dr Lenna Gilford. Patient has been on MTX & Plaquenil x years and recently Enbrel was added to her regimen. She reports intermittent pains in her hands and difficulty with fine motor control of fingers.       HTN predates since 1997. Patient's BP has been controlled at home and patient denies any cardiac symptoms as chest pain, palpitations, shortness of breath, dizziness or ankle swelling. Today's BP is at goal - 134/76.      Patient's hyperlipidemia is controlled with diet and medications. Patient denies myalgias or other medication SE's. Last lipids were at goal: Lab Results  Component Value Date   CHOL 175 04/13/2017   HDL 81 04/13/2017   LDLCALC 81 04/13/2017   TRIG 63 04/13/2017   CHOLHDL 2.2 04/13/2017      Patient has prediabetes (A1c 5.8% in 2011)  and patient denies reactive hypoglycemic symptoms, visual blurring, diabetic polys, or paresthesias. Last A1c was at goal: Lab Results  Component Value Date   HGBA1C 5.0 12/16/2016      Patient has been on Thyroid Replacement since 1980. Finally, patient has history of Vitamin D Deficiency ("38" in 2008) and last Vitamin D was at goal>  Lab Results  Component Value Date   VD25OH 81 12/16/2016   Current Outpatient Prescriptions on File Prior to Visit  Medication Sig  . alendronate (FOSAMAX) 70 MG  tablet Take 1 tablet once weekly with a full glass of water on an empty stomach for 30 minutes.  . ALPRAZolam (XANAX) 1 MG tablet TAKE 1 TABLET TWICE DAILY AS NEEDED FOR ANXIETY  . aspirin 81 MG tablet Take 81 mg by mouth daily.  . Cholecalciferol (VITAMIN D PO) Take 5,000 Units by mouth daily.   . folic acid (FOLVITE) 1 MG tablet Take 3 mg by mouth daily.   . hydroxychloroquine (PLAQUENIL) 200 MG tablet Take by mouth daily. Take 1 tab AM and PM  . ibuprofen (ADVIL,MOTRIN) 200 MG tablet Take 200 mg by mouth every 6 (six) hours as needed. PRN  . levothyroxine (SYNTHROID, LEVOTHROID) 112 MCG tablet Take 1 tablet (112 mcg total) by mouth daily before breakfast.  . losartan (COZAAR) 50 MG tablet TAKE 1 TABLET DAILY FOR BLOOD PRESSURE  . methotrexate (RHEUMATREX) 2.5 MG tablet 25 mg once a week.   Marland Kitchen omeprazole (PRILOSEC) 40 MG capsule TAKE 1 CAPSULE TWICE DAILY  FOR  ACID  REFLUX  . traZODone (DESYREL) 50 MG tablet Take 1 tablet (50 mg total) by mouth at bedtime as needed for sleep.   No current facility-administered medications on file prior to visit.    Allergies  Allergen Reactions  . Ace Inhibitors Cough  . Ambien [Zolpidem] Other (See Comments)    Odd Feeling  . Crestor [Rosuvastatin]   . Pravastatin   . Prozac [Fluoxetine  Hcl] Other (See Comments)    Decreased libido  . Zoloft [Sertraline Hcl]    Past Medical History:  Diagnosis Date  . Anemia    Hx of   . Arthritis   . Calculus of bile duct without mention of cholecystitis or obstruction   . Cataract   . GERD (gastroesophageal reflux disease)   . H. pylori infection    Hx of   . Heart murmur   . Hyperlipidemia   . Hypertension   . Hypothyroid   . PUD (peptic ulcer disease)   . Rheumatoid arthritis(714.0)    Health Maintenance  Topic Date Due  . Hepatitis C Screening  1945/11/28  . INFLUENZA VACCINE  06/08/2017  . COLONOSCOPY  03/08/2018  . MAMMOGRAM  07/13/2018  . TETANUS/TDAP  11/08/2022  . DEXA SCAN  Completed   . PNA vac Low Risk Adult  Completed   Immunization History  Administered Date(s) Administered  . Influenza, High Dose Seasonal PF 07/16/2014, 09/17/2015, 07/01/2016  . Pneumococcal Conjugate-13 10/16/2015  . Pneumococcal Polysaccharide-23 11/08/2012  . Td 11/08/2012  . Zoster 09/17/2015   Past Surgical History:  Procedure Laterality Date  . AV FISTULA REPAIR    . BLADDER SUSPENSION    . BUNIONECTOMY    . CARPAL TUNNEL RELEASE    . CHOLECYSTECTOMY  09/07/2011  . CYSTECTOMY     left breast  . ERCP  09/29/2011   Procedure: ENDOSCOPIC RETROGRADE CHOLANGIOPANCREATOGRAPHY (ERCP);  Surgeon: Inda Castle, MD;  Location: [facility];  Service: Endoscopy;  Laterality: N/A;  . PILONIDAL CYST EXCISION    . TENOLYSIS  10/26/2011   Procedure: TENDON SHEATH RELEASE/TENOLYSIS;  Surgeon: Wynonia Sours, MD;  Location: [facility];  Service: Orthopedics;  Laterality: Right;  tenosynovectomy removal superficialis slip right index finger  . TONSILLECTOMY    . TOTAL SHOULDER REPLACEMENT Right 02/22/2014   Dr. Enrigue Catena at [facility]  . TRIGGER FINGER RELEASE    . VAGINAL HYSTERECTOMY     ovaries not removed   Family History  Problem Relation Age of Onset  . Lung cancer Mother   . Stomach cancer Father   . Heart attack Father        MI at age 83  . Colon cancer Maternal Grandfather   . Heart attack Brother        MI at age 46  . Prostate cancer Paternal Grandfather    Social History  Substance Use Topics  . Smoking status: Former Smoker    Quit date: 10/25/1995  . Smokeless tobacco: Never Used     Comment: Maryann Conners [id number]  . Alcohol use 0.0 oz/week     Comment: occasional    ROS Constitutional: Denies fever, chills, weight loss/gain, headaches, insomnia,  night sweats, and change in appetite. Does c/o fatigue. Eyes: Denies redness, blurred vision, diplopia, discharge, itchy, watery eyes.  ENT: Denies discharge, congestion, post nasal drip, epistaxis, sore throat, earache,  hearing loss, dental pain, Tinnitus, Vertigo, Sinus pain, snoring.  Cardio: Denies chest pain, palpitations, irregular heartbeat, syncope, dyspnea, diaphoresis, orthopnea, PND, claudication, edema Respiratory: denies cough, dyspnea, DOE, pleurisy, hoarseness, laryngitis, wheezing.  Gastrointestinal: Denies dysphagia, heartburn, reflux, water brash, pain, cramps, nausea, vomiting, bloating, diarrhea, constipation, hematemesis, melena, hematochezia, jaundice, hemorrhoids Genitourinary: Denies dysuria, frequency, urgency, nocturia, hesitancy, discharge, hematuria, flank pain Breast: Breast lumps, nipple discharge, bleeding.  Musculoskeletal: Denies arthralgia, myalgia, stiffness, Jt. Swelling, pain, limp, and strain/sprain. Denies falls. Skin: Denies puritis, rash, hives, warts, acne, eczema, changing in skin lesion Neuro: No  weakness, tremor, incoordination, spasms, paresthesia, pain Psychiatric: Denies confusion, memory loss, sensory loss. Denies Depression. Endocrine: Denies change in weight, skin, hair change, nocturia, and paresthesia, diabetic polys, visual blurring, hyper / hypo glycemic episodes.  Heme/Lymph: No excessive bleeding, bruising, enlarged lymph nodes.  Physical Exam  BP 134/76   Pulse 60   Temp 97.5 F (36.4 C)   Resp 16   Ht 5\' 2"  (1.575 m)   Wt 152 lb 9.6 oz (69.2 kg)   BMI 27.91 kg/m   General Appearance: Well nourished, well groomed and in no apparent distress.  Eyes: PERRLA, EOMs, conjunctiva no swelling or erythema, normal fundi and vessels. Sinuses: No frontal/maxillary tenderness ENT/Mouth: EACs patent / TMs  nl. Nares clear without erythema, swelling, mucoid exudates. Oral hygiene is good. No erythema, swelling, or exudate. Tongue normal, non-obstructing. Tonsils not swollen or erythematous. Hearing normal.  Neck: Supple, thyroid normal. No bruits, nodes or JVD. Respiratory: Respiratory effort normal.  BS equal and clear bilateral without rales, rhonci,  wheezing or stridor. Cardio: Heart sounds are normal with regular rate and rhythm and no murmurs, rubs or gallops. Peripheral pulses are normal and equal bilaterally without edema. No aortic or femoral bruits. Chest: symmetric with normal excursions and percussion. Breasts: Symmetric, without lumps, nipple discharge, retractions, or fibrocystic changes.  Abdomen: Flat, soft with bowel sounds active. Nontender, no guarding, rebound, hernias, masses, or organomegaly.  Lymphatics: Non tender without lymphadenopathy.  Musculoskeletal: Full ROM all peripheral extremities, joint stability, 5/5 strength and normal gait. Very minimal lateral splaying of fingers at the MCP jts.  Skin: Warm and dry without rashes, lesions, cyanosis, clubbing or  ecchymosis.  Neuro: Cranial nerves intact, reflexes equal bilaterally. Normal muscle tone, no cerebellar symptoms. Sensation intact.  Pysch: Alert and oriented X 3, normal affect, Insight and Judgment appropriate.   Assessment and Plan  1. Annual Preventative Screening Examination   2. Essential hypertension  - EKG 12-Lead - Urinalysis, Routine w reflex microscopic - Microalbumin / creatinine urine ratio - CBC with Differential/Platelet - BASIC METABOLIC PANEL WITH GFR - Magnesium - TSH  3. Hyperlipidemia, mixed  - EKG 12-Lead - Hepatic function panel - Lipid panel - TSH  4. Prediabetes  - EKG 12-Lead - Hemoglobin A1c - Insulin, random  5. Vitamin D deficiency  - VITAMIN D 25 Hydroxy   6. Hypothyroidism  - TSH  7. Rheumatoid arthritis involving multiple sites, unspecified rheumatoid factor presence ([place])  8. Gastroesophageal reflux disease   9. Screening for rectal cancer  - POC Hemoccult Bld/Stl   10. Screening for ischemic heart disease  - EKG 12-Lead  11. Medication management  - Urinalysis, Routine w reflex microscopic - Microalbumin / creatinine urine ratio - CBC with Differential/Platelet - BASIC METABOLIC PANEL  WITH GFR - Hepatic function panel - Magnesium - Lipid panel - TSH - Hemoglobin A1c - Insulin, random - VITAMIN D 25 Hydroxy       Patient was counseled in prudent diet to achieve/maintain BMI less than 25 for weight control, BP monitoring, regular exercise and medications. Discussed med's effects and SE's. Screening labs and tests as requested with regular follow-up as recommended. Over 40 minutes of exam, counseling, chart review and high complex critical decision making was performed.

## 2017-04-13 NOTE — Patient Instructions (Signed)

## 2017-04-14 LAB — URINALYSIS, ROUTINE W REFLEX MICROSCOPIC
Bilirubin Urine: NEGATIVE
Glucose, UA: NEGATIVE
HGB URINE DIPSTICK: NEGATIVE
Ketones, ur: NEGATIVE
LEUKOCYTES UA: NEGATIVE
NITRITE: NEGATIVE
PH: 6.5 (ref 5.0–8.0)
PROTEIN: NEGATIVE
Specific Gravity, Urine: 1.008 (ref 1.001–1.035)

## 2017-04-14 LAB — TSH: TSH: 1.21 mIU/L

## 2017-04-14 LAB — MAGNESIUM: Magnesium: 1.9 mg/dL (ref 1.5–2.5)

## 2017-04-14 LAB — MICROALBUMIN / CREATININE URINE RATIO: CREATININE, URINE: 46 mg/dL (ref 20–320)

## 2017-04-14 LAB — HEMOGLOBIN A1C
Hgb A1c MFr Bld: 5.1 % (ref <5.7)
MEAN PLASMA GLUCOSE: 100 mg/dL

## 2017-04-14 LAB — VITAMIN D 25 HYDROXY (VIT D DEFICIENCY, FRACTURES): Vit D, 25-Hydroxy: 95 ng/mL (ref 30–100)

## 2017-04-14 LAB — INSULIN, RANDOM: Insulin: 5.2 u[IU]/mL (ref 2.0–19.6)

## 2017-04-28 DIAGNOSIS — E039 Hypothyroidism, unspecified: Secondary | ICD-10-CM | POA: Diagnosis not present

## 2017-06-02 DIAGNOSIS — Z79899 Other long term (current) drug therapy: Secondary | ICD-10-CM | POA: Diagnosis not present

## 2017-06-02 DIAGNOSIS — Z6828 Body mass index (BMI) 28.0-28.9, adult: Secondary | ICD-10-CM | POA: Diagnosis not present

## 2017-06-02 DIAGNOSIS — M2550 Pain in unspecified joint: Secondary | ICD-10-CM | POA: Diagnosis not present

## 2017-06-02 DIAGNOSIS — E663 Overweight: Secondary | ICD-10-CM | POA: Diagnosis not present

## 2017-06-02 DIAGNOSIS — M0609 Rheumatoid arthritis without rheumatoid factor, multiple sites: Secondary | ICD-10-CM | POA: Diagnosis not present

## 2017-06-02 DIAGNOSIS — M150 Primary generalized (osteo)arthritis: Secondary | ICD-10-CM | POA: Diagnosis not present

## 2017-06-02 DIAGNOSIS — E039 Hypothyroidism, unspecified: Secondary | ICD-10-CM | POA: Diagnosis not present

## 2017-06-10 ENCOUNTER — Other Ambulatory Visit: Payer: Self-pay | Admitting: Internal Medicine

## 2017-06-15 ENCOUNTER — Other Ambulatory Visit: Payer: Self-pay | Admitting: Physician Assistant

## 2017-06-15 ENCOUNTER — Ambulatory Visit (INDEPENDENT_AMBULATORY_CARE_PROVIDER_SITE_OTHER): Payer: Medicare PPO | Admitting: Physician Assistant

## 2017-06-15 VITALS — BP 130/80 | HR 83 | Temp 97.7°F | Wt 155.0 lb

## 2017-06-15 DIAGNOSIS — D2362 Other benign neoplasm of skin of left upper limb, including shoulder: Secondary | ICD-10-CM | POA: Diagnosis not present

## 2017-06-15 DIAGNOSIS — D367 Benign neoplasm of other specified sites: Secondary | ICD-10-CM

## 2017-06-15 DIAGNOSIS — Z79899 Other long term (current) drug therapy: Secondary | ICD-10-CM | POA: Diagnosis not present

## 2017-06-15 DIAGNOSIS — L0291 Cutaneous abscess, unspecified: Secondary | ICD-10-CM | POA: Diagnosis not present

## 2017-06-15 LAB — CBC WITH DIFFERENTIAL/PLATELET
Basophils Absolute: 51 {cells}/uL (ref 0–200)
Basophils Relative: 1 %
Eosinophils Absolute: 204 {cells}/uL (ref 15–500)
Eosinophils Relative: 4 %
HCT: 37.9 % (ref 35.0–45.0)
Hemoglobin: 12.3 g/dL (ref 11.7–15.5)
Lymphocytes Relative: 27 %
Lymphs Abs: 1377 {cells}/uL (ref 850–3900)
MCH: 29.8 pg (ref 27.0–33.0)
MCHC: 32.5 g/dL (ref 32.0–36.0)
MCV: 91.8 fL (ref 80.0–100.0)
MPV: 12.2 fL (ref 7.5–12.5)
Monocytes Absolute: 408 {cells}/uL (ref 200–950)
Monocytes Relative: 8 %
Neutro Abs: 3060 {cells}/uL (ref 1500–7800)
Neutrophils Relative %: 60 %
Platelets: 202 10*3/uL (ref 140–400)
RBC: 4.13 MIL/uL (ref 3.80–5.10)
RDW: 14.7 % (ref 11.0–15.0)
WBC: 5.1 10*3/uL (ref 3.8–10.8)

## 2017-06-16 ENCOUNTER — Encounter: Payer: Self-pay | Admitting: Physician Assistant

## 2017-06-16 LAB — BASIC METABOLIC PANEL WITH GFR
BUN: 7 mg/dL (ref 7–25)
CHLORIDE: 103 mmol/L (ref 98–110)
CO2: 26 mmol/L (ref 20–32)
CREATININE: 0.94 mg/dL — AB (ref 0.60–0.93)
Calcium: 10.4 mg/dL (ref 8.6–10.4)
GFR, Est African American: 71 mL/min (ref >=60)
GFR, Est Non African American: 62 mL/min (ref >=60)
Glucose, Bld: 80 mg/dL (ref 65–99)
POTASSIUM: 4.9 mmol/L (ref 3.5–5.3)
SODIUM: 137 mmol/L (ref 135–146)

## 2017-06-16 NOTE — Progress Notes (Addendum)
Subjective:    Patient ID: [name], female    DOB: 1945/12/02, 71 y.o.   MRN: [id number]  HPI 71 y.o. WF with history of RA on enbrel.  She states that last Wednesday after Enbrel injection on her left thigh she got a warm, red nodule on her left elbow/arm. It has decreased in size. She denies any fever, chills, distal swelling. No sore throat, diarrhea, AB pain.   Blood pressure 130/80, pulse 83, temperature 97.7 F (36.5 C), weight 155 lb (70.3 kg).  Medications Current Outpatient Prescriptions on File Prior to Visit  Medication Sig  . alendronate (FOSAMAX) 70 MG tablet Take 1 tablet once weekly with a full glass of water on an empty stomach for 30 minutes.  . ALPRAZolam (XANAX) 1 MG tablet TAKE 1 TABLET TWICE DAILY AS NEEDED FOR ANXIETY  . aspirin 81 MG tablet Take 81 mg by mouth daily.  Marland Kitchen b complex vitamins capsule Take 1 capsule by mouth daily.  Marland Kitchen BIOTIN PO Take 1 tablet by mouth daily.  . Cholecalciferol (VITAMIN D PO) Take 5,000 Units by mouth daily.   Marland Kitchen etanercept (ENBREL SURECLICK) 50 MG/ML injection Inject 50 mg into the skin once a week.  . folic acid (FOLVITE) 1 MG tablet Take 3 mg by mouth daily.   Marland Kitchen GLUCOSAMINE-CHONDROITIN PO Take 1 tablet by mouth daily.  . hydroxychloroquine (PLAQUENIL) 200 MG tablet Take by mouth daily. Take 1 tab AM and PM  . ibuprofen (ADVIL,MOTRIN) 200 MG tablet Take 200 mg by mouth every 6 (six) hours as needed. PRN  . levothyroxine (SYNTHROID, LEVOTHROID) 112 MCG tablet Take 1 tablet (112 mcg total) by mouth daily before breakfast.  . losartan (COZAAR) 50 MG tablet TAKE 1 TABLET DAILY FOR BLOOD PRESSURE  . Magnesium 500 MG TABS Take 1 tablet by mouth daily.  Marland Kitchen MELATONIN PO Take 1 tablet by mouth at bedtime.  . methotrexate (RHEUMATREX) 2.5 MG tablet 25 mg once a week.   Marland Kitchen omeprazole (PRILOSEC) 40 MG capsule TAKE 1 CAPSULE TWICE DAILY  FOR  ACID  REFLUX  . OVER THE COUNTER MEDICATION Takes hair, skin and nails 1 time daily.  .  Probiotic CAPS Take 1 capsule by mouth daily.  . traZODone (DESYREL) 50 MG tablet Take 1 tablet (50 mg total) by mouth at bedtime as needed for sleep.   No current facility-administered medications on file prior to visit.     Problem list She has Essential hypertension; Rheumatoid arthritis ([place]); Hyperlipidemia; GERD (gastroesophageal reflux disease); Personal history of colonic polyps; Vitamin D deficiency; Prediabetes; Medication management; Hypothyroidism; Osteoporosis; Hyperparathyroidism ([place]); and BMI 27.0-27.9,adult on her problem list.  Review of Systems  Constitutional: Negative.   HENT: Negative.   Respiratory: Negative.   Cardiovascular: Negative.   Gastrointestinal: Negative.   Genitourinary: Negative.   Musculoskeletal: Negative.   Skin: Positive for rash.  Neurological: Negative.   Hematological: Negative.   Psychiatric/Behavioral: Negative.        Objective:   Physical Exam  Constitutional: She is oriented to person, place, and time. She appears well-developed and well-nourished.  HENT:  Right Ear: External ear normal.  Left Ear: External ear normal.  Nose: Nose normal.  Mouth/Throat: Oropharynx is clear and moist. No oropharyngeal exudate.  Neck: Normal range of motion. Neck supple.  Cardiovascular: Normal rate and regular rhythm.   Pulmonary/Chest: Effort normal and breath sounds normal.  Musculoskeletal: Normal range of motion. She exhibits no tenderness or deformity.  Left elbow without warmth, tenderness, swelling,  no distal edema.   Lymphadenopathy:    She has no cervical adenopathy.  Neurological: She is alert and oriented to person, place, and time. She has normal reflexes.  Skin: Skin is warm and dry.  Mobile erythematous warm cyst/nondule on left distal elbow ulnar side about 3x4 cm, no streaking, no distal edema.       Assessment & Plan:    Cyst, dermoid, arm, left ? Cyst/reactive versus infection Patient without fever, chills Check  CBC/BMP Start bactrim DS x 7 days and follow up 1 week Hold off on enbrel shot for now

## 2017-06-20 NOTE — Progress Notes (Signed)
Subjective:    Patient ID: [name], female    DOB: 05-07-1946, 71 y.o.   MRN: [id number]  HPI 71 y.o. WF with history of RA on enbrel was seen 08/08 for possible abscess/cyst on left arm.  She developed a red nodule/cyst on left elbow after Enbrel injection on her left thigh. She did not do her enbrel shot and took bactrim, states has improved, redness and warmth gone but cyst still there.  She denies any fever, chills, distal swelling. No sore throat, diarrhea, AB pain.   Blood pressure 140/80, pulse 64, temperature 98.1 F (36.7 C), resp. rate 14, height 5\' 2"  (1.575 m), weight 155 lb 6.4 oz (70.5 kg), SpO2 98 %.  Medications Current Outpatient Prescriptions on File Prior to Visit  Medication Sig  . alendronate (FOSAMAX) 70 MG tablet Take 1 tablet once weekly with a full glass of water on an empty stomach for 30 minutes.  . ALPRAZolam (XANAX) 1 MG tablet TAKE 1 TABLET TWICE DAILY AS NEEDED FOR ANXIETY  . aspirin 81 MG tablet Take 81 mg by mouth daily.  Marland Kitchen b complex vitamins capsule Take 1 capsule by mouth daily.  Marland Kitchen BIOTIN PO Take 1 tablet by mouth daily.  . Cholecalciferol (VITAMIN D PO) Take 5,000 Units by mouth daily.   Marland Kitchen etanercept (ENBREL SURECLICK) 50 MG/ML injection Inject 50 mg into the skin once a week.  . folic acid (FOLVITE) 1 MG tablet Take 3 mg by mouth daily.   Marland Kitchen GLUCOSAMINE-CHONDROITIN PO Take 1 tablet by mouth daily.  . hydroxychloroquine (PLAQUENIL) 200 MG tablet Take by mouth daily. Take 1 tab AM and PM  . ibuprofen (ADVIL,MOTRIN) 200 MG tablet Take 200 mg by mouth every 6 (six) hours as needed. PRN  . levothyroxine (SYNTHROID, LEVOTHROID) 112 MCG tablet Take 1 tablet (112 mcg total) by mouth daily before breakfast.  . losartan (COZAAR) 50 MG tablet TAKE 1 TABLET DAILY FOR BLOOD PRESSURE  . Magnesium 500 MG TABS Take 1 tablet by mouth daily.  Marland Kitchen MELATONIN PO Take 1 tablet by mouth at bedtime.  . methotrexate (RHEUMATREX) 2.5 MG tablet 25 mg once a week.   Marland Kitchen  omeprazole (PRILOSEC) 40 MG capsule TAKE 1 CAPSULE TWICE DAILY  FOR  ACID  REFLUX  . OVER THE COUNTER MEDICATION Takes hair, skin and nails 1 time daily.  . Probiotic CAPS Take 1 capsule by mouth daily.  . traZODone (DESYREL) 50 MG tablet Take 1 tablet (50 mg total) by mouth at bedtime as needed for sleep.   No current facility-administered medications on file prior to visit.     Problem list She has Essential hypertension; Rheumatoid arthritis ([place]); Hyperlipidemia; GERD (gastroesophageal reflux disease); Personal history of colonic polyps; Vitamin D deficiency; Prediabetes; Medication management; Hypothyroidism; Osteoporosis; Hyperparathyroidism ([place]); and BMI 27.0-27.9,adult on her problem list.  Review of Systems  Constitutional: Negative.   HENT: Negative.   Respiratory: Negative.   Cardiovascular: Negative.   Gastrointestinal: Negative.   Genitourinary: Negative.   Musculoskeletal: Negative.   Skin: Positive for rash.  Neurological: Negative.   Hematological: Negative.   Psychiatric/Behavioral: Negative.        Objective:   Physical Exam  Constitutional: She is oriented to person, place, and time. She appears well-developed and well-nourished.  HENT:  Right Ear: External ear normal.  Left Ear: External ear normal.  Nose: Nose normal.  Mouth/Throat: Oropharynx is clear and moist. No oropharyngeal exudate.  Neck: Normal range of motion. Neck supple.  Cardiovascular: Normal  rate and regular rhythm.   Pulmonary/Chest: Effort normal and breath sounds normal.  Musculoskeletal: Normal range of motion. She exhibits no tenderness or deformity.  Left elbow without warmth, tenderness, swelling, no distal edema.   Lymphadenopathy:    She has no cervical adenopathy.  Neurological: She is alert and oriented to person, place, and time. She has normal reflexes.  Skin: Skin is warm and dry.  Mobile cyst/nondule on left distal elbow ulnar side about 3x4 cm, no streaking no warmth, no  redness, no distal edema. Has improved but not decreased in size      Assessment & Plan:    Cyst, dermoid, arm, left I&D done with marcaine no epi 2 cc Tolerated well Mucoid yellow substance expressed with minimal blood Patient without fever, chills Can do enbrel Keep wrapped, can do soap water, do heat Will continue to drain, if not better will have to remove entire cyst  Future Appointments Date Time Provider Gerald  08/04/2017 8:45 AM Vicie Mutters, PA-C [facility] None  11/03/2017 10:30 AM [name], MD [facility] None  05/09/2018 10:00 AM [name], MD [facility] None

## 2017-06-21 ENCOUNTER — Encounter: Payer: Self-pay | Admitting: Physician Assistant

## 2017-06-21 ENCOUNTER — Ambulatory Visit (INDEPENDENT_AMBULATORY_CARE_PROVIDER_SITE_OTHER): Payer: Medicare PPO | Admitting: Physician Assistant

## 2017-06-21 VITALS — BP 140/80 | HR 64 | Temp 98.1°F | Resp 14 | Ht 62.0 in | Wt 155.4 lb

## 2017-06-21 DIAGNOSIS — D2362 Other benign neoplasm of skin of left upper limb, including shoulder: Secondary | ICD-10-CM

## 2017-06-21 DIAGNOSIS — D367 Benign neoplasm of other specified sites: Secondary | ICD-10-CM

## 2017-06-21 NOTE — Patient Instructions (Signed)
  May have to remove entire thing another visit If it does not go away or get better follow up  Epidermal Cyst An epidermal cyst is a small, painless lump under your skin. It may be called an epidermal inclusion cyst or an infundibular cyst. The cyst contains a grayish-white, bad-smelling substance (keratin). It is important not to pop epidermal cysts yourself. These cysts are usually harmless (benign), but they can get infected. Symptoms of infection may include:  Redness.  Inflammation.  Tenderness.  Warmth.  Fever.  A grayish-white, bad-smelling substance draining from the cyst.  Pus draining from the cyst.  Follow these instructions at home:  Take over-the-counter and prescription medicines only as told by your doctor.  If you were prescribed an antibiotic, use it as told by your doctor. Do not stop using the antibiotic even if you start to feel better.  Keep the area around your cyst clean and dry.  Wear loose, dry clothing.  Do not try to pop your cyst.  Avoid touching your cyst.  Check your cyst every day for signs of infection.  Keep all follow-up visits as told by your doctor. This is important. How is this prevented?  Wear clean, dry, clothing.  Avoid wearing tight clothing.  Keep your skin clean and dry. Shower or take baths every day.  Wash your body with a benzoyl peroxide wash when you shower or bathe. Contact a health care provider if:  Your cyst has symptoms of infection.  Your condition is not improving or is getting worse.  You have a cyst that looks different from other cysts you have had.  You have a fever. Get help right away if:  Redness spreads from the cyst into the surrounding area. This information is not intended to replace advice given to you by your health care provider. Make sure you discuss any questions you have with your health care provider. Document Released: 12/02/2004 Document Revised: 06/23/2016 Document Reviewed:  08/27/2015 Elsevier Interactive Patient Education  [employer].

## 2017-06-22 ENCOUNTER — Ambulatory Visit: Payer: Self-pay | Admitting: Physician Assistant

## 2017-06-26 ENCOUNTER — Encounter: Payer: Self-pay | Admitting: Physician Assistant

## 2017-06-30 ENCOUNTER — Encounter: Payer: Self-pay | Admitting: Internal Medicine

## 2017-06-30 ENCOUNTER — Ambulatory Visit (INDEPENDENT_AMBULATORY_CARE_PROVIDER_SITE_OTHER): Payer: Medicare PPO | Admitting: Internal Medicine

## 2017-06-30 VITALS — BP 134/74 | HR 60 | Temp 97.4°F | Resp 16 | Ht 62.0 in | Wt 161.0 lb

## 2017-06-30 DIAGNOSIS — L723 Sebaceous cyst: Secondary | ICD-10-CM

## 2017-06-30 DIAGNOSIS — I10 Essential (primary) hypertension: Secondary | ICD-10-CM

## 2017-06-30 DIAGNOSIS — L089 Local infection of the skin and subcutaneous tissue, unspecified: Secondary | ICD-10-CM | POA: Diagnosis not present

## 2017-06-30 NOTE — Progress Notes (Signed)
Subjective:    Patient ID: LATAYSHA VOHRA, female    DOB: 04-22-1946, 71 y.o.   MRN: [id number]  HPI  This very nice lady with hx/o labile HTN has negative CV systems review. She did have an infected cyst of the L elbow area I&D'd about 2 weeks ago. The area apparently quickly increased again in size.   Medication Sig  . alendronate  70 MG  Take 1 tablet once weekly  . ALPRAZolam 1 MG  TAKE 1 TABLET TWICE DAILY AS NEEDED FOR ANXIETY  . aspirin 81 MG tablet Take 81 mg by mouth daily.  Marland Kitchen b complex vitamins  Take 1 capsule by mouth daily.  Marland Kitchen BIOTIN  Take 1 tablet by mouth daily.  Marland Kitchen VITAMIN D Take 5,000 Units by mouth daily.   Scarlette Shorts SURECLICK injec Inject 50 mg into the skin once a week.  . folic acid  1 MG tablet Take 3 mg by mouth daily.   Marland Kitchen GLUCOSAMINE Take 1 tablet by mouth daily.  . hydroxychloroquine  200 MG  Take by mouth daily. Take 1 tab AM and PM  . ibuprofen  200 MG  Take 200 mg by mouth every 6 (six) hours as needed. PRN  . levothyroxine  112 MCG  Take 1 tablet (112 mcg total) by mouth daily before breakfast.  . losartan  50 MG tablet TAKE 1 TABLET DAILY FOR BLOOD PRESSURE  . Magnesium 500 MG Take 1 tablet by mouth daily.  Marland Kitchen MELATONIN  Take 1 tablet by mouth at bedtime.  . methotrexate  2.5 MG  25 mg once a week.   Marland Kitchen omeprazole  40 MG  TAKE 1 CAPSULE TWICE DAILY  FOR  ACID  REFLUX  . hair, skin and nails Takes  1 time daily.  . Probiotic CAPS Take 1 capsule by mouth daily.  . traZODone  50 MG  Take 1 tablet (50 mg total) by mouth at bedtime as needed for sleep.   Review of Systems    10 point systems review negative except as above.    Objective:   Physical Exam  BP 134/74   Pulse 60   Temp (!) 97.4 F (36.3 C)   Resp 16   Ht 5\' 2"  (1.575 m)   Wt 161 lb (73 kg)   BMI 29.45 kg/m   HEENT - WNL. Neck - supple.  Chest - Clear equal BS. Cor - Nl HS. RRR w/o sig MGR. PP 1(+). No edema. MS- FROM w/o deformities.  Gait Nl. Neuro -  Nl w/o focal  abnormalities. Skin - there is a large tender subcutaneous mass approx 2 x 3 cm over the lateral aspect of the  forearm.    Procedure (CPT - 11402)      After informed consent and aseptic prep with alcohol,  the area around the cyst was anesthetized w/ 3 ml of Marcaine 0.5%  SQ and intradermal. Then with a #10 scalpel the mass was sharply excised and dissected free. The necrotic scarified cystic remnant was discarded. Then the cyst cavity was irrigated with H2O2. The cyst cavity was collapsed with # 3 vertical mattress sutures w/ 3-0 proline aligning the everted wound edges. Then the alignment was secured with #5 running locking sutures of proline 3-0. Neosporin ung was applied and the wound was covered with a 4" x 6" Tegaderm. Patient was instructed in po wound care.  Assessment & Plan:   1. Essential hypertension  2. Infected sebaceous cyst  - excised  in toto

## 2017-07-06 ENCOUNTER — Ambulatory Visit: Payer: Medicare PPO | Admitting: Internal Medicine

## 2017-07-06 VITALS — BP 142/74 | HR 64 | Temp 97.5°F | Resp 16 | Ht 62.0 in | Wt 161.0 lb

## 2017-07-06 DIAGNOSIS — L723 Sebaceous cyst: Principal | ICD-10-CM

## 2017-07-06 DIAGNOSIS — L089 Local infection of the skin and subcutaneous tissue, unspecified: Secondary | ICD-10-CM

## 2017-07-06 NOTE — Progress Notes (Signed)
    1  week ago an infected sebaceous cyst was excised from the Left elbow area and she returns today.    Wound appears well healed w/o signs of infection.    All sutures removed.   Triple Abx ung applied and the wound covered w/ 4" x 6" Tegaderm.    Patient instructed in wound care.

## 2017-08-03 NOTE — Progress Notes (Signed)
MEDICARE ANNUAL WELLNESS VISIT AND FOLLOW UP  Assessment:   HYPERTENSION - CBC with Differential - BASIC METABOLIC PANEL WITH GFR - Hepatic function panel  Gastroesophageal reflux disease without esophagitis Get back on PPI for now  ARTHRITIS, RHEUMATOID Continue medications and rheum follow up   Encounter for long-term (current) use of other medications - Magnesium  Hyperlipidemia - Lipid panel   Other abnormal glucose Discussed general issues about diabetes pathophysiology and management., Educational material distributed., Suggested low cholesterol diet., Encouraged aerobic exercise., Discussed foot care., Reminded to get yearly retinal exam. - Hemoglobin A1c - Insulin, fasting  Vitamin D Deficiency - Vit D  25 hydroxy (rtn osteoporosis monitoring)  Unspecified hypothyroidism - TSH - not doing well with levels, and symptoms will swtich from generic to brand name  Hyperparathyroidism ([place]) Monitor calcium  Osteoporosis Off fosamax, check DEXA, continue vitamin D  Personal history of colonic polyps Up to date   BMI 27.0-27.9,adult Obesity with co morbidities- long discussion about weight loss, diet, and exercise  Depression, major, single episode, in partial remission (HCC) Depression- continue medications, stress management techniques discussed, increase water, good sleep hygiene discussed, increase exercise, and increase veggies.   Insomnia Discussed good sleep hygiene.   Future Appointments Date Time Provider [place]  11/03/2017 10:30 AM [name], MD [facility] None  05/09/2018 10:00 AM [name], MD [facility] None    Plan:   During the course of the visit the patient was educated and counseled about appropriate screening and preventive services including:    Pneumococcal vaccine   Influenza vaccine  Td vaccine  Screening electrocardiogram  Screening mammography  Bone densitometry screening  Colorectal cancer  screening  Diabetes screening  Glaucoma screening  Nutrition counseling   Advanced directives: given info/requested  Subjective:   [name] is a 71 y.o. female who presents for Medicare Annual Wellness Visit and   Her blood pressure has been controlled at home, today their BP is BP: 132/76 She does not workout.  She denies chest pain, shortness of breath, dizziness.  She is on cholesterol medication and denies myalgias. Her cholesterol is at goal. The cholesterol last visit was:   Lab Results  Component Value Date   CHOL 175 04/13/2017   HDL 81 04/13/2017   LDLCALC 81 04/13/2017   TRIG 63 04/13/2017   CHOLHDL 2.2 04/13/2017   She has been working on diet and exercise for prediabetes, and denies increased appetite, nausea, paresthesia of the feet, polydipsia and polyuria. Last A1C in the office was:  Lab Results  Component Value Date   HGBA1C 5.1 04/13/2017   Patient is on Vitamin D supplement. Lab Results  Component Value Date   VD25OH 95 04/13/2017   She is on thyroid medication. Her medication was changed last visit, to 193mcg, 1 pill daily.  Patient denies nervousness, palpitations and weight changes.  Lab Results  Component Value Date   TSH 1.21 04/13/2017  .  She sees Dr. Trudie Reed regularly and states that her RA is well controlled on plaquenil and Methotrexate.  She is on fosamax for osteoporosis.  She has been having trouble sleeping, could not tolerate the trazodone, has been on xanax and melatonin and enbrel BMI is Body mass index is 28.72 kg/m., she is working on diet and exercise. Wt Readings from Last 3 Encounters:  08/04/17 157 lb (71.2 kg)  07/06/17 161 lb (73 kg)  06/30/17 161 lb (73 kg)    Names of Other Physician/Practitioners you currently use: 1. [facility]- here for primary care 2. Dr. Gershon Crane, eye doctor, last visit q 6 months 2017 Patient Care Team: [name], MD as PCP - General  (Internal Medicine) Stark Klein, MD as Consulting Physician (General Surgery) Gavin Pound, MD as Consulting Physician (Rheumatology) Lelon Perla, MD as Consulting Physician (Cardiology) Inda Castle, MD as Consulting Physician (Gastroenterology) Starr Lake, MD as Referring Physician (Orthopedic Surgery)  Medication Review Current Outpatient Prescriptions on File Prior to Visit  Medication Sig Dispense Refill  . alendronate (FOSAMAX) 70 MG tablet Take 1 tablet once weekly with a full glass of water on an empty stomach for 30 minutes. 12 tablet 3  . ALPRAZolam (XANAX) 1 MG tablet TAKE 1 TABLET TWICE DAILY AS NEEDED FOR ANXIETY 180 tablet 1  . aspirin 81 MG tablet Take 81 mg by mouth daily.    Marland Kitchen b complex vitamins capsule Take 1 capsule by mouth daily.    Marland Kitchen BIOTIN PO Take 1 tablet by mouth daily.    . Cholecalciferol (VITAMIN D PO) Take 5,000 Units by mouth daily.     Marland Kitchen etanercept (ENBREL SURECLICK) 50 MG/ML injection Inject 50 mg into the skin once a week.    . folic acid (FOLVITE) 1 MG tablet Take 3 mg by mouth daily.     Marland Kitchen GLUCOSAMINE-CHONDROITIN PO Take 1 tablet by mouth daily.    . hydroxychloroquine (PLAQUENIL) 200 MG tablet Take by mouth daily. Take 1 tab AM and PM    . ibuprofen (ADVIL,MOTRIN) 200 MG tablet Take 200 mg by mouth every 6 (six) hours as needed. PRN    . levothyroxine (SYNTHROID, LEVOTHROID) 112 MCG tablet Take 1 tablet (112 mcg total) by mouth daily before breakfast. 90 tablet 3  . losartan (COZAAR) 50 MG tablet TAKE 1 TABLET DAILY FOR BLOOD PRESSURE 90 tablet 1  . Magnesium 500 MG TABS Take 1 tablet by mouth daily.    Marland Kitchen MELATONIN PO Take 1 tablet by mouth at bedtime.    . methotrexate (RHEUMATREX) 2.5 MG tablet 25 mg once a week.     Marland Kitchen omeprazole (PRILOSEC) 40 MG capsule TAKE 1 CAPSULE TWICE DAILY  FOR  ACID  REFLUX 180 capsule 1  . OVER THE COUNTER MEDICATION Takes hair, skin and nails 1 time daily.    . Probiotic CAPS Take 1 capsule by mouth  daily.    . traZODone (DESYREL) 50 MG tablet Take 1 tablet (50 mg total) by mouth at bedtime as needed for sleep. 60 tablet 2   No current facility-administered medications on file prior to visit.     Current Problems (verified) Patient Active Problem List   Diagnosis Date Noted  . BMI 27.0-27.9,adult 09/17/2015  . Hyperparathyroidism ([place]) 07/25/2015  . Hypothyroidism 02/11/2014  . Osteoporosis 02/11/2014  . Vitamin D deficiency 01/22/2014  . Prediabetes 01/22/2014  . Medication management 01/22/2014  . Personal history of colonic polyps 04/18/2013  . GERD (gastroesophageal reflux disease) 02/28/2013  . Hyperlipidemia 10/07/2011  . Essential hypertension 10/21/2008  . Rheumatoid arthritis ([place]) 10/21/2008    Screening Tests Immunization History  Administered Date(s) Administered  . Influenza, High Dose Seasonal PF 07/16/2014, 09/17/2015, 07/01/2016, 08/04/2017  . Pneumococcal Conjugate-13 10/16/2015  . Pneumococcal Polysaccharide-23 11/08/2012  . Td 11/08/2012  . Zoster 09/17/2015   Preventative care: Last colonoscopy: 03/2013 Last mammogram: 07/2016 Last pap smear/pelvic exam: remote   DEXA: 2017 put back on fosamax after being off of it a year MRI head 2012 normal Echo 2012 normal  Stress test 2016 US RENAL normal 2013  Prior vaccinations: TD or Tdap: 2014  Influenza: 2018 TODAY Prevnar 13: 2016 Pneumococcal: 2014 Shingles/Zostavax: 2016  Allergies Allergies  Allergen Reactions  . Ace Inhibitors Cough  . Ambien [Zolpidem] Other (See Comments)    Odd Feeling  . Crestor [Rosuvastatin]   . Pravastatin   . Prozac [Fluoxetine Hcl] Other (See Comments)    Decreased libido  . Zoloft [Sertraline Hcl]     SURGICAL HISTORY She  has a past surgical history that includes Vaginal hysterectomy; Carpal tunnel release; Cystectomy; AV fistula repair; Bladder suspension; Pilonidal cyst excision; Trigger finger release; Bunionectomy; ERCP (09/29/2011); Tonsillectomy;  Cholecystectomy (09/07/2011); Tenolysis (10/26/2011); and Total shoulder replacement (Right, 02/22/2014). FAMILY HISTORY Her family history includes Colon cancer in her maternal grandfather; Heart attack in her brother and father; Lung cancer in her mother; Prostate cancer in her paternal grandfather; Stomach cancer in her father. SOCIAL HISTORY She  reports that she quit smoking about 21 years ago. She has never used smokeless tobacco. She reports that she drinks alcohol. She reports that she does not use drugs.  MEDICARE WELLNESS OBJECTIVES: Physical activity: Current Exercise Habits: The patient does not participate in regular exercise at present Cardiac risk factors: Cardiac Risk Factors include: advanced age (>58men, >21 women);dyslipidemia;hypertension;sedentary lifestyle Depression/mood screen:   Depression screen [facility] 2/9 08/04/2017  Decreased Interest 0  Down, Depressed, Hopeless 0  PHQ - 2 Score 0    ADLs:  In your present state of health, do you have any difficulty performing the following activities: 08/04/2017 04/13/2017  Hearing? N N  Vision? N N  Difficulty concentrating or making decisions? N N  Walking or climbing stairs? N N  Dressing or bathing? N N  Doing errands, shopping? N N  Some recent data might be hidden    Cognitive Testing  Alert? Yes  Normal Appearance?Yes  Oriented to person? Yes  Place? Yes   Time? Yes  Recall of three objects?  Yes  Can perform simple calculations? Yes  Displays appropriate judgment?Yes  Can read the correct time from a watch face?Yes  EOL planning: Does Patient Have a Medical Advance Directive?: No Would patient like information on creating a medical advance directive?: Yes (MAU/Ambulatory/Procedural Areas - Information given)  Objective:   Blood pressure 132/76, pulse 70, temperature 97.9 F (36.6 C), resp. rate 14, height 5\' 2"  (1.575 m), weight 157 lb (71.2 kg), SpO2 98 %. Body mass index is 28.72 kg/m.  General appearance:  alert, no distress, WD/WN,  female HEENT: normocephalic, sclerae anicteric, TMs pearly, nares patent, no discharge or erythema, pharynx normal Oral cavity: MMM, no lesions Neck: supple, no lymphadenopathy, no thyromegaly, no masses Heart: RRR, normal S1, S2, no murmurs Lungs: CTA bilaterally, no wheezes, rhonchi, or rales Abdomen: +bs, soft, nontender, non distended, no masses, no hepatomegaly, no splenomegaly Musculoskeletal: + costochondral tenderness to palpation, no swelling, no obvious deformity Extremities: no edema, no cyanosis, no clubbing Pulses: 2+ symmetric, upper and lower extremities, normal cap refill Neurological: alert, oriented x 3, CN2-12 intact, strength normal upper extremities and lower extremities, sensation normal throughout, DTRs 2+ throughout, no cerebellar signs, gait normal Psychiatric: normal affect, behavior normal, pleasant  Breast: defer Gyn: defer Rectal: defer  Medicare Attestation I have personally reviewed: The patient's medical and social history Their use of alcohol, tobacco or illicit drugs Their current medications and supplements The patient's functional ability including ADLs,fall risks, home safety risks, cognitive, and hearing and visual impairment Diet and physical activities Evidence  for depression or mood disorders  The patient's weight, height, BMI, and visual acuity have been recorded in the chart.  I have made referrals, counseling, and provided education to the patient based on review of the above and I have provided the patient with a written personalized care plan for preventive services.     Vicie Mutters, PA-C   08/04/2017

## 2017-08-04 ENCOUNTER — Encounter: Payer: Self-pay | Admitting: Physician Assistant

## 2017-08-04 ENCOUNTER — Ambulatory Visit (INDEPENDENT_AMBULATORY_CARE_PROVIDER_SITE_OTHER): Payer: Medicare PPO | Admitting: Physician Assistant

## 2017-08-04 VITALS — BP 132/76 | HR 70 | Temp 97.9°F | Resp 14 | Ht 62.0 in | Wt 157.0 lb

## 2017-08-04 DIAGNOSIS — M810 Age-related osteoporosis without current pathological fracture: Secondary | ICD-10-CM

## 2017-08-04 DIAGNOSIS — K219 Gastro-esophageal reflux disease without esophagitis: Secondary | ICD-10-CM

## 2017-08-04 DIAGNOSIS — Z0001 Encounter for general adult medical examination with abnormal findings: Secondary | ICD-10-CM

## 2017-08-04 DIAGNOSIS — Z860100 Personal history of colon polyps, unspecified: Secondary | ICD-10-CM

## 2017-08-04 DIAGNOSIS — R6889 Other general symptoms and signs: Secondary | ICD-10-CM

## 2017-08-04 DIAGNOSIS — Z0000 Encounter for general adult medical examination without abnormal findings: Secondary | ICD-10-CM

## 2017-08-04 DIAGNOSIS — E782 Mixed hyperlipidemia: Secondary | ICD-10-CM

## 2017-08-04 DIAGNOSIS — Z79899 Other long term (current) drug therapy: Secondary | ICD-10-CM | POA: Diagnosis not present

## 2017-08-04 DIAGNOSIS — E039 Hypothyroidism, unspecified: Secondary | ICD-10-CM | POA: Diagnosis not present

## 2017-08-04 DIAGNOSIS — E559 Vitamin D deficiency, unspecified: Secondary | ICD-10-CM

## 2017-08-04 DIAGNOSIS — Z23 Encounter for immunization: Secondary | ICD-10-CM | POA: Diagnosis not present

## 2017-08-04 DIAGNOSIS — R7303 Prediabetes: Secondary | ICD-10-CM

## 2017-08-04 DIAGNOSIS — E213 Hyperparathyroidism, unspecified: Secondary | ICD-10-CM

## 2017-08-04 DIAGNOSIS — I10 Essential (primary) hypertension: Secondary | ICD-10-CM | POA: Diagnosis not present

## 2017-08-04 DIAGNOSIS — M069 Rheumatoid arthritis, unspecified: Secondary | ICD-10-CM

## 2017-08-04 DIAGNOSIS — Z6827 Body mass index (BMI) 27.0-27.9, adult: Secondary | ICD-10-CM

## 2017-08-04 LAB — CBC WITH DIFFERENTIAL/PLATELET
BASOS ABS: 61 {cells}/uL (ref 0–200)
Basophils Relative: 1.3 %
EOS PCT: 7.1 %
Eosinophils Absolute: 334 cells/uL (ref 15–500)
HCT: 36.2 % (ref 35.0–45.0)
Hemoglobin: 12.3 g/dL (ref 11.7–15.5)
LYMPHS ABS: 1626 {cells}/uL (ref 850–3900)
MCH: 30.5 pg (ref 27.0–33.0)
MCHC: 34 g/dL (ref 32.0–36.0)
MCV: 89.8 fL (ref 80.0–100.0)
MONOS PCT: 12.7 %
MPV: 12.9 fL — ABNORMAL HIGH (ref 7.5–12.5)
NEUTROS PCT: 44.3 %
Neutro Abs: 2082 cells/uL (ref 1500–7800)
PLATELETS: 190 10*3/uL (ref 140–400)
RBC: 4.03 10*6/uL (ref 3.80–5.10)
RDW: 13.7 % (ref 11.0–15.0)
Total Lymphocyte: 34.6 %
WBC mixed population: 597 cells/uL (ref 200–950)
WBC: 4.7 10*3/uL (ref 3.8–10.8)

## 2017-08-04 LAB — HEPATIC FUNCTION PANEL
AG Ratio: 2.1 (calc) (ref 1.0–2.5)
ALBUMIN MSPROF: 4.1 g/dL (ref 3.6–5.1)
ALT: 18 U/L (ref 6–29)
AST: 30 U/L (ref 10–35)
Alkaline phosphatase (APISO): 69 U/L (ref 33–130)
Bilirubin, Direct: 0.1 mg/dL (ref 0.0–0.2)
Globulin: 2 g/dL (calc) (ref 1.9–3.7)
Indirect Bilirubin: 0.5 mg/dL (calc) (ref 0.2–1.2)
Total Bilirubin: 0.6 mg/dL (ref 0.2–1.2)
Total Protein: 6.1 g/dL (ref 6.1–8.1)

## 2017-08-04 LAB — MAGNESIUM: MAGNESIUM: 2 mg/dL (ref 1.5–2.5)

## 2017-08-04 LAB — BASIC METABOLIC PANEL WITH GFR
BUN/Creatinine Ratio: 7 (calc) (ref 6–22)
BUN: 7 mg/dL (ref 7–25)
CALCIUM: 10.5 mg/dL — AB (ref 8.6–10.4)
CHLORIDE: 105 mmol/L (ref 98–110)
CO2: 28 mmol/L (ref 20–32)
Creat: 1.01 mg/dL — ABNORMAL HIGH (ref 0.60–0.93)
GFR, Est African American: 65 mL/min/{1.73_m2} (ref >=60)
GFR, Est Non African American: 56 mL/min/{1.73_m2} — ABNORMAL LOW (ref >=60)
GLUCOSE: 88 mg/dL (ref 65–99)
POTASSIUM: 4.3 mmol/L (ref 3.5–5.3)
Sodium: 140 mmol/L (ref 135–146)

## 2017-08-04 LAB — LIPID PANEL
Cholesterol: 198 mg/dL (ref <200)
HDL: 80 mg/dL (ref >50)
LDL Cholesterol (Calc): 98 mg/dL (calc)
Non-HDL Cholesterol (Calc): 118 mg/dL (calc) (ref <130)
TRIGLYCERIDES: 105 mg/dL (ref <150)
Total CHOL/HDL Ratio: 2.5 (calc) (ref <5.0)

## 2017-08-04 LAB — TSH: TSH: 2.46 m[IU]/L (ref 0.40–4.50)

## 2017-08-04 MED ORDER — IBUPROFEN 800 MG PO TABS: 800.0000 mg | ORAL_TABLET | Freq: Three times a day (TID) | ORAL | 2 refills | Status: DC | PRN

## 2017-08-04 NOTE — Patient Instructions (Addendum)
Stop liquids 2 hours before bed  11 Tips to Follow:  1. No caffeine after 3pm: Avoid beverages with caffeine (soda, tea, energy drinks, etc.) especially after 3pm. 2. Don't go to bed hungry: Have your evening meal at least 3 hrs. before going to sleep. It's fine to have a small bedtime snack such as a glass of milk and a few crackers but don't have a big meal. 3. Have a nightly routine before bed: Plan on "winding down" before you go to sleep. Begin relaxing about 1 hour before you go to bed. Try doing a quiet activity such as listening to calming music, reading a book or meditating. 4. Turn off the TV and ALL electronics including video games, tablets, laptops, etc. 1 hour before sleep, and keep them out of the bedroom. 5. Turn off your cell phone and all notifications (new email and text alerts) or even better, leave your phone outside your room while you sleep. Studies have shown that a part of your brain continues to respond to certain lights and sounds even while you're still asleep. 6. Make your bedroom quiet, dark and cool. If you can't control the noise, try wearing earplugs or using a fan to block out other sounds. 7. Practice relaxation techniques. Try reading a book or meditating or drain your brain by writing a list of what you need to do the next day. 8. Don't nap unless you feel sick: you'll have a better night's sleep. 9. Don't smoke, or quit if you do. Nicotine, alcohol, and marijuana can all keep you awake. Talk to your health care provider if you need help with substance use. 10. Most importantly, wake up at the same time every day (or within 1 hour of your usual wake up time) EVEN on the weekends. A regular wake up time promotes sleep hygiene and prevents sleep problems. 11. Reduce exposure to bright light in the last three hours of the day before going to sleep. Maintaining good sleep hygiene and having good sleep habits lower your risk of developing sleep problems. Getting better  sleep can also improve your concentration and alertness. Try the simple steps in this guide. If you still have trouble getting enough rest, make an appointment with your health care provider.

## 2017-08-05 NOTE — Progress Notes (Signed)
Pt aware of lab results & voiced understanding of those results.

## 2017-08-25 DIAGNOSIS — M069 Rheumatoid arthritis, unspecified: Secondary | ICD-10-CM | POA: Diagnosis not present

## 2017-08-25 DIAGNOSIS — Z961 Presence of intraocular lens: Secondary | ICD-10-CM | POA: Diagnosis not present

## 2017-08-25 DIAGNOSIS — Z79899 Other long term (current) drug therapy: Secondary | ICD-10-CM | POA: Diagnosis not present

## 2017-09-01 DIAGNOSIS — M150 Primary generalized (osteo)arthritis: Secondary | ICD-10-CM | POA: Diagnosis not present

## 2017-09-01 DIAGNOSIS — M0609 Rheumatoid arthritis without rheumatoid factor, multiple sites: Secondary | ICD-10-CM | POA: Diagnosis not present

## 2017-09-01 DIAGNOSIS — E039 Hypothyroidism, unspecified: Secondary | ICD-10-CM | POA: Diagnosis not present

## 2017-09-01 DIAGNOSIS — Z6829 Body mass index (BMI) 29.0-29.9, adult: Secondary | ICD-10-CM | POA: Diagnosis not present

## 2017-09-01 DIAGNOSIS — E663 Overweight: Secondary | ICD-10-CM | POA: Diagnosis not present

## 2017-09-01 DIAGNOSIS — M2550 Pain in unspecified joint: Secondary | ICD-10-CM | POA: Diagnosis not present

## 2017-09-01 DIAGNOSIS — Z79899 Other long term (current) drug therapy: Secondary | ICD-10-CM | POA: Diagnosis not present

## 2017-09-02 ENCOUNTER — Other Ambulatory Visit: Payer: Self-pay | Admitting: Internal Medicine

## 2017-10-03 ENCOUNTER — Ambulatory Visit (INDEPENDENT_AMBULATORY_CARE_PROVIDER_SITE_OTHER): Payer: Self-pay

## 2017-10-03 ENCOUNTER — Encounter (INDEPENDENT_AMBULATORY_CARE_PROVIDER_SITE_OTHER): Payer: Self-pay | Admitting: Physician Assistant

## 2017-10-03 ENCOUNTER — Ambulatory Visit (INDEPENDENT_AMBULATORY_CARE_PROVIDER_SITE_OTHER): Payer: Commercial Managed Care - HMO | Admitting: Physician Assistant

## 2017-10-03 ENCOUNTER — Ambulatory Visit (INDEPENDENT_AMBULATORY_CARE_PROVIDER_SITE_OTHER): Payer: Commercial Managed Care - HMO

## 2017-10-03 DIAGNOSIS — M25572 Pain in left ankle and joints of left foot: Secondary | ICD-10-CM | POA: Diagnosis not present

## 2017-10-03 DIAGNOSIS — M722 Plantar fascial fibromatosis: Secondary | ICD-10-CM | POA: Diagnosis not present

## 2017-10-03 DIAGNOSIS — M25571 Pain in right ankle and joints of right foot: Secondary | ICD-10-CM

## 2017-10-03 MED ORDER — METHYLPREDNISOLONE 4 MG PO TABS: ORAL_TABLET | ORAL | 0 refills | Status: DC

## 2017-10-03 MED ORDER — NAPROXEN 500 MG PO TABS: 500.0000 mg | ORAL_TABLET | Freq: Two times a day (BID) | ORAL | 1 refills | Status: DC

## 2017-10-03 NOTE — Progress Notes (Signed)
Office Visit Note   Patient: [name]           Date of Birth: Feb 06, 1946           MRN: [id number] Visit Date: 10/03/2017              Requested by: [name], [address] [place], [place] [id number] PCP: [name], MD   Assessment & Plan: Visit Diagnoses:  1. Pain in right ankle and joints of right foot   2. Pain in left ankle and joints of left foot   3. Plantar fasciitis, left   4. Plantar fasciitis, right     Plan: Discussed shoewear with her at length.  Handout given on gastrocsoleus stretching exercises.  Heel cups.  Placed on naproxen after she finishes the Medrol Dosepak.  See her back in 1 month to check her progress lack of.  Follow-Up Instructions: No Follow-up on file.   Orders:  Orders Placed This Encounter  Procedures  . XR Ankle 2 Views Right  . XR Ankle 2 Views Left   Meds ordered this encounter  Medications  . methylPREDNISolone (MEDROL) 4 MG tablet    Sig: Take as directed    Dispense:  21 tablet    Refill:  0  . naproxen (NAPROSYN) 500 MG tablet    Sig: Take 1 tablet (500 mg total) by mouth 2 (two) times daily with a meal.    Dispense:  60 tablet    Refill:  1      Procedures: No procedures performed   Clinical Data: No additional findings.   Subjective: Chief Complaint  Patient presents with  . Right Ankle - Pain  . Left Ankle - Pain    HPI [name] comes in today due to pain in the back of her heels.  She states that her heel cords are tight.  She has shooting pains at times.  She tried Advil and Aleve without any real relief.  Pain is been ongoing for "quite a while but worse over the last month".  Right heel is worse than the left.  No known injury.  She does wear some open back shoes around the home. Review of Systems Please see HPI otherwise negative   Objective: Vital Signs: There were no vitals taken for this visit.  Physical Exam  Constitutional: She is oriented to person,  place, and time. She appears well-developed and well-nourished. No distress.  Cardiovascular: Intact distal pulses.  Pulmonary/Chest: Effort normal.  Neurological: She is alert and oriented to person, place, and time.  Skin: She is not diaphoretic.  Psychiatric: She has a normal mood and affect.    Ortho Exam Lower extremities calves are supple nontender.  Achilles are intact bilaterally.  She has no tenderness at the Achilles insertion bilaterally.  Tight gastrocs bilaterally right greater than left.  Tenderness over the medial tubercle of the calcaneus bilaterally.  No tenderness over the posterior tibial tendons peroneal tendons bilaterally.  Good inversion eversion bilaterally against resistance.  Good dorsiflexion plantarflexion of ankle without pain.  Remainder bilateral feet are nontender. Specialty Comments:  No specialty comments available.  Imaging: No results found.   PMFS History: Patient Active Problem List   Diagnosis Date Noted  . BMI 27.0-27.9,adult 09/17/2015  . Hyperparathyroidism ([place]) 07/25/2015  . Hypothyroidism 02/11/2014  . Osteoporosis 02/11/2014  . Vitamin D deficiency 01/22/2014  . Prediabetes 01/22/2014  . Medication management 01/22/2014  . Personal history of colonic polyps 04/18/2013  .  GERD (gastroesophageal reflux disease) 02/28/2013  . Hyperlipidemia 10/07/2011  . Essential hypertension 10/21/2008  . Rheumatoid arthritis ([place]) 10/21/2008   Past Medical History:  Diagnosis Date  . Anemia    Hx of   . Arthritis   . Calculus of bile duct without mention of cholecystitis or obstruction   . Cataract   . GERD (gastroesophageal reflux disease)   . H. pylori infection    Hx of   . Heart murmur   . Hyperlipidemia   . Hypertension   . Hypothyroid   . PUD (peptic ulcer disease)   . Rheumatoid arthritis(714.0)     Family History  Problem Relation Age of Onset  . Lung cancer Mother   . Stomach cancer Father   . Heart attack Father        MI  at age 8  . Colon cancer Maternal Grandfather   . Heart attack Brother        MI at age 94  . Prostate cancer Paternal Grandfather     Past Surgical History:  Procedure Laterality Date  . AV FISTULA REPAIR    . BLADDER SUSPENSION    . BUNIONECTOMY    . CARPAL TUNNEL RELEASE    . CHOLECYSTECTOMY  09/07/2011  . CYSTECTOMY     left breast  . ERCP  09/29/2011   Procedure: ENDOSCOPIC RETROGRADE CHOLANGIOPANCREATOGRAPHY (ERCP);  Surgeon: Inda Castle, MD;  Location: [facility];  Service: Endoscopy;  Laterality: N/A;  . PILONIDAL CYST EXCISION    . TENOLYSIS  10/26/2011   Procedure: TENDON SHEATH RELEASE/TENOLYSIS;  Surgeon: Wynonia Sours, MD;  Location: [facility];  Service: Orthopedics;  Laterality: Right;  tenosynovectomy removal superficialis slip right index finger  . TONSILLECTOMY    . TOTAL SHOULDER REPLACEMENT Right 02/22/2014   [facility]  . TRIGGER FINGER RELEASE    . VAGINAL HYSTERECTOMY     ovaries not removed   Social History   Occupational History  . Occupation: retired    Fish farm manager: RETIRED    Comment: Retired  Tobacco Use  . Smoking status: Former Smoker    Last attempt to quit: 10/25/1995    Years since quitting: 21.9  . Smokeless tobacco: Never Used  . Tobacco comment: Maryann Conners [id number]  Substance and Sexual Activity  . Alcohol use: Yes    Alcohol/week: 0.0 oz    Comment: occasional  . Drug use: No  . Sexual activity: Yes    Birth control/protection: Post-menopausal

## 2017-10-13 ENCOUNTER — Other Ambulatory Visit: Payer: Self-pay | Admitting: Internal Medicine

## 2017-10-13 ENCOUNTER — Telehealth (INDEPENDENT_AMBULATORY_CARE_PROVIDER_SITE_OTHER): Payer: Self-pay | Admitting: Physician Assistant

## 2017-10-13 DIAGNOSIS — F411 Generalized anxiety disorder: Secondary | ICD-10-CM

## 2017-10-13 NOTE — Telephone Encounter (Signed)
Please advise 

## 2017-10-13 NOTE — Telephone Encounter (Signed)
Patient called advised she is not any better. Patient asked what does Artis Delay want to do now. The number to contact patient is [phone]

## 2017-10-14 NOTE — Telephone Encounter (Signed)
IC s/w patient and she does not want to do physical therapy at this time. She will wait.

## 2017-10-14 NOTE — Telephone Encounter (Signed)
This will take time to resolve , we could send her to formal PT for stretching of her plantar fascia, gastroc/soleus and modalities,

## 2017-11-03 ENCOUNTER — Ambulatory Visit: Payer: Medicare PPO | Admitting: Internal Medicine

## 2017-11-03 VITALS — BP 138/76 | HR 68 | Temp 97.1°F | Resp 16 | Ht 62.0 in | Wt 165.5 lb

## 2017-11-03 DIAGNOSIS — R7309 Other abnormal glucose: Secondary | ICD-10-CM | POA: Diagnosis not present

## 2017-11-03 DIAGNOSIS — R7303 Prediabetes: Secondary | ICD-10-CM

## 2017-11-03 DIAGNOSIS — E039 Hypothyroidism, unspecified: Secondary | ICD-10-CM | POA: Diagnosis not present

## 2017-11-03 DIAGNOSIS — Z79899 Other long term (current) drug therapy: Secondary | ICD-10-CM | POA: Diagnosis not present

## 2017-11-03 DIAGNOSIS — E559 Vitamin D deficiency, unspecified: Secondary | ICD-10-CM

## 2017-11-03 DIAGNOSIS — M722 Plantar fascial fibromatosis: Secondary | ICD-10-CM | POA: Diagnosis not present

## 2017-11-03 DIAGNOSIS — I10 Essential (primary) hypertension: Secondary | ICD-10-CM | POA: Diagnosis not present

## 2017-11-03 DIAGNOSIS — M069 Rheumatoid arthritis, unspecified: Secondary | ICD-10-CM

## 2017-11-03 DIAGNOSIS — E782 Mixed hyperlipidemia: Secondary | ICD-10-CM

## 2017-11-03 NOTE — Patient Instructions (Signed)

## 2017-11-03 NOTE — Progress Notes (Signed)
This very nice 71 y.o. MWF  presents for 6 month follow up with Hypertension, Hyperlipidemia, Pre-Diabetes and Vitamin D Deficiency. Patient has hx/o Rh Arthritis since 1985 and is on MTX, Plaquenil and Enbrel currently followed by Dr Lenna Gilford.      Patient also is c/o bilat arch pains previously dx'd as plantar fasciitis and ha been prescribed heel cups and stretching exercises. She has tried Medrol dose pack and Naprosyn and c/o constant pain with every step and is very angry that she is still having pains and desires some relief.      Patient is treated for HTN (1997) & BP has been controlled at home. Today's BP is at goal - 138/76. Patient has had no complaints of any cardiac type chest pain, palpitations, dyspnea / orthopnea / PND, dizziness, claudication, or dependent edema.     Hyperlipidemia is controlled with diet & meds. Patient denies myalgias or other med SE's. Last Lipids were at goal: Lab Results  Component Value Date   CHOL 187 11/03/2017   HDL 76 11/03/2017   LDLCALC 81 04/13/2017   TRIG 91 11/03/2017   CHOLHDL 2.5 11/03/2017      Also, the patient has history of PreDiabetes (A1c 5.8% /2011) and has had no symptoms of reactive hypoglycemia, diabetic polys, paresthesias or visual blurring.  Last A1c was normal at goal: Lab Results  Component Value Date   HGBA1C 5.2 11/03/2017      Patient has been on Thyroid replacement since 1980. Further, the patient also has history of Vitamin D Deficiency ("38"/2008) and supplements vitamin D without any suspected side-effects. Last vitamin D was at goal: Lab Results  Component Value Date   VD25OH 98 11/03/2017   Current Outpatient Medications on File Prior to Visit  Medication Sig  . alendronate (FOSAMAX) 70 MG tablet TAKE 1 TABLET ONCE WEEKLY WITH A FULL GLASS OF WATER ON AN EMPTY STOMACH FOR 30 MINUTES.  Marland Kitchen ALPRAZolam (XANAX) 1 MG tablet Take 1/2 to 1 tablet 1 to 2 x / day only if needed for Acute Anxiety Attack and please try to  limit to 5 days /week to avoid addiction  . aspirin 81 MG tablet Take 81 mg by mouth daily.  Marland Kitchen b complex vitamins capsule Take 1 capsule by mouth daily.  Marland Kitchen BIOTIN PO Take 1 tablet by mouth daily.  . Cholecalciferol (VITAMIN D PO) Take 5,000 Units by mouth daily.   Marland Kitchen etanercept (ENBREL SURECLICK) 50 MG/ML injection Inject 50 mg into the skin once a week.  . folic acid (FOLVITE) 1 MG tablet Take 3 mg by mouth daily.   Marland Kitchen GLUCOSAMINE-CHONDROITIN PO Take 1 tablet by mouth daily.  . hydroxychloroquine (PLAQUENIL) 200 MG tablet Take by mouth daily. Take 1 tab AM and PM  . ibuprofen (ADVIL,MOTRIN) 800 MG tablet Take 1 tablet (800 mg total) by mouth every 8 (eight) hours as needed for mild pain or moderate pain. PRN  . levothyroxine (SYNTHROID, LEVOTHROID) 112 MCG tablet Take 1 tablet (112 mcg total) by mouth daily before breakfast.  . losartan (COZAAR) 50 MG tablet TAKE 1 TABLET DAILY FOR BLOOD PRESSURE  . Magnesium 500 MG TABS Take 1 tablet by mouth daily.  Marland Kitchen MELATONIN PO Take 1 tablet by mouth at bedtime.  . methotrexate (RHEUMATREX) 2.5 MG tablet 25 mg once a week.   . naproxen (NAPROSYN) 500 MG tablet Take 1 tablet (500 mg total) by mouth 2 (two) times daily with a meal.  . omeprazole (  PRILOSEC) 40 MG capsule TAKE 1 CAPSULE TWICE DAILY  FOR  ACID  REFLUX  . OVER THE COUNTER MEDICATION Takes hair, skin and nails 1 time daily.  . Probiotic CAPS Take 1 capsule by mouth daily.   No current facility-administered medications on file prior to visit.    Allergies  Allergen Reactions  . Ace Inhibitors Cough  . Ambien [Zolpidem] Other (See Comments)    Odd Feeling  . Crestor [Rosuvastatin]   . Pravastatin   . Prozac [Fluoxetine Hcl] Other (See Comments)    Decreased libido  . Zoloft [Sertraline Hcl]    PMHx:   Past Medical History:  Diagnosis Date  . Anemia    Hx of   . Arthritis   . Calculus of bile duct without mention of cholecystitis or obstruction   . Cataract   . GERD  (gastroesophageal reflux disease)   . H. pylori infection    Hx of   . Heart murmur   . Hyperlipidemia   . Hypertension   . Hypothyroid   . PUD (peptic ulcer disease)   . Rheumatoid arthritis(714.0)    Immunization History  Administered Date(s) Administered  . Influenza, High Dose Seasonal PF 07/16/2014, 09/17/2015, 07/01/2016, 08/04/2017  . Pneumococcal Conjugate-13 10/16/2015  . Pneumococcal Polysaccharide-23 11/08/2012  . Td 11/08/2012  . Zoster 09/17/2015   Past Surgical History:  Procedure Laterality Date  . AV FISTULA REPAIR    . BLADDER SUSPENSION    . BUNIONECTOMY    . CARPAL TUNNEL RELEASE    . CHOLECYSTECTOMY  09/07/2011  . CYSTECTOMY     left breast  . ERCP  09/29/2011   Procedure: ENDOSCOPIC RETROGRADE CHOLANGIOPANCREATOGRAPHY (ERCP);  Surgeon: Inda Castle, MD;  Location: [facility];  Service: Endoscopy;  Laterality: N/A;  . PILONIDAL CYST EXCISION    . TENOLYSIS  10/26/2011   Procedure: TENDON SHEATH RELEASE/TENOLYSIS;  Surgeon: Wynonia Sours, MD;  Location: [facility];  Service: Orthopedics;  Laterality: Right;  tenosynovectomy removal superficialis slip right index finger  . TONSILLECTOMY    . TOTAL SHOULDER REPLACEMENT Right 02/22/2014   [facility]rigue [facility]  . TRIGGER FINGER RELEASE    . VAGINAL HYSTERECTOMY     ovaries not removed   FHx:    Reviewed / unchanged  SHx:    Reviewed / unchanged  Systems Review:  Constitutional: Denies fever, chills, wt changes, headaches, insomnia, fatigue, night sweats, change in appetite. Eyes: Denies redness, blurred vision, diplopia, discharge, itchy, watery eyes.  ENT: Denies discharge, congestion, post nasal drip, epistaxis, sore throat, earache, hearing loss, dental pain, tinnitus, vertigo, sinus pain, snoring.  CV: Denies chest pain, palpitations, irregular heartbeat, syncope, dyspnea, diaphoresis, orthopnea, PND, claudication or edema. Respiratory: denies cough, dyspnea, DOE, pleurisy,  hoarseness, laryngitis, wheezing.  Gastrointestinal: Denies dysphagia, odynophagia, heartburn, reflux, water brash, abdominal pain or cramps, nausea, vomiting, bloating, diarrhea, constipation, hematemesis, melena, hematochezia  or hemorrhoids. Genitourinary: Denies dysuria, frequency, urgency, nocturia, hesitancy, discharge, hematuria or flank pain. Musculoskeletal: Denies arthralgias, myalgias, stiffness, jt. swelling, pain, limping or strain/sprain.  Skin: Denies pruritus, rash, hives, warts, acne, eczema or change in skin lesion(s). Neuro: No weakness, tremor, incoordination, spasms, paresthesia or pain. Psychiatric: Denies confusion, memory loss or sensory loss. Endo: Denies change in weight, skin or hair change.  Heme/Lymph: No excessive bleeding, bruising or enlarged lymph nodes.  Physical Exam  BP 138/76   Pulse 68   Temp (!) 97.1 F (36.2 C)   Resp 16   Ht 5\' 2"  (  1.575 m)   Wt 165 lb 8 oz (75.1 kg)   BMI 30.27 kg/m   Appears well nourished, well groomed  and in no distress.  Eyes: PERRLA, EOMs, conjunctiva no swelling or erythema. Sinuses: No frontal/maxillary tenderness ENT/Mouth: EAC's clear, TM's nl w/o erythema, bulging. Nares clear w/o erythema, swelling, exudates. Oropharynx clear without erythema or exudates. Oral hygiene is good. Tongue normal, non obstructing. Hearing intact.  Neck: Supple. Thyroid nl. Car 2+/2+ without bruits, nodes or JVD. Chest: Respirations nl with BS clear & equal w/o rales, rhonchi, wheezing or stridor.  Cor: Heart sounds normal w/ regular rate and rhythm without sig. murmurs, gallops, clicks or rubs. Peripheral pulses normal and equal  without edema.  Abdomen: Soft & bowel sounds normal. Non-tender w/o guarding, rebound, hernias, masses or organomegaly.  Lymphatics: Unremarkable.  Musculoskeletal: Full ROM all peripheral extremities, joint stability, 5/5 strength and normal gait. Exquisite trigger points at the proximal arch of the sole of  each foot.   Procedure: After informed consent and aseptic prep with alcohol both the Right and Left feet trigger points were injected with a mixture of 1 ml of Marcaine 0.5% and 1 ml (10 mg) Dexamethasone.  Skin: Warm, dry without exposed rashes, lesions or ecchymosis apparent.  Neuro: Cranial nerves intact, reflexes equal bilaterally. Sensory-motor testing grossly intact. Tendon reflexes grossly intact.  Pysch: Alert & oriented x 3.  Insight and judgement nl & appropriate. No ideations.  Assessment and Plan:  1. Essential hypertension  - Continue medication, monitor blood pressure at home.  - Continue DASH diet. Reminder to go to the ER if any CP,  SOB, nausea, dizziness, severe HA, changes vision/speech.  - CBC with Differential/Platelet - BASIC METABOLIC PANEL WITH GFR - Magnesium - TSH  2. Hyperlipidemia, mixed  - Continue diet/meds, exercise,& lifestyle modifications.  - Continue monitor periodic cholesterol/liver & renal functions  - Hepatic function panel - Lipid panel - TSH  3. Prediabetes  - Continue diet, exercise, lifestyle modifications.  - Monitor appropriate labs.  - Hemoglobin A1c - Insulin, random  4. Vitamin D deficiency  - Continue supplementation.  - VITAMIN D 25 Hydroxy  5. Abnormal glucose  - Hemoglobin A1c - Insulin, random  6. Hypothyroidism  - TSH  7. Rheumatoid arthritis involving multiple sites, unspecified rheumatoid factor presence ([place])  8. Plantar fasciitis, right  9. Plantar fasciitis, left  10. Medication management  - CBC with Differential/Platelet - BASIC METABOLIC PANEL WITH GFR - Hepatic function panel - Magnesium - Lipid panel - TSH - Hemoglobin A1c - Insulin, random - VITAMIN D 25 Hydroxy        Discussed  regular exercise, BP monitoring, weight control to achieve/maintain BMI less than 25 and discussed med and SE's. Recommended labs to assess and monitor clinical status with further disposition pending  results of labs. Over 30 minutes of exam, counseling, chart review was performed.

## 2017-11-04 ENCOUNTER — Other Ambulatory Visit: Payer: Self-pay | Admitting: Internal Medicine

## 2017-11-04 DIAGNOSIS — E039 Hypothyroidism, unspecified: Secondary | ICD-10-CM

## 2017-11-04 LAB — BASIC METABOLIC PANEL WITH GFR
BUN: 10 mg/dL (ref 7–25)
CALCIUM: 10 mg/dL (ref 8.6–10.4)
CHLORIDE: 105 mmol/L (ref 98–110)
CO2: 31 mmol/L (ref 20–32)
CREATININE: 0.92 mg/dL (ref 0.60–0.93)
GFR, EST AFRICAN AMERICAN: 73 mL/min/{1.73_m2} (ref >=60)
GFR, EST NON AFRICAN AMERICAN: 63 mL/min/{1.73_m2} (ref >=60)
Glucose, Bld: 97 mg/dL (ref 65–99)
Potassium: 4.5 mmol/L (ref 3.5–5.3)
Sodium: 140 mmol/L (ref 135–146)

## 2017-11-04 LAB — CBC WITH DIFFERENTIAL/PLATELET
BASOS PCT: 1.1 %
Basophils Absolute: 63 cells/uL (ref 0–200)
EOS ABS: 576 {cells}/uL — AB (ref 15–500)
Eosinophils Relative: 10.1 %
HEMATOCRIT: 36.2 % (ref 35.0–45.0)
HEMOGLOBIN: 12.1 g/dL (ref 11.7–15.5)
LYMPHS ABS: 1545 {cells}/uL (ref 850–3900)
MCH: 30.4 pg (ref 27.0–33.0)
MCHC: 33.4 g/dL (ref 32.0–36.0)
MCV: 91 fL (ref 80.0–100.0)
MPV: 12.6 fL — ABNORMAL HIGH (ref 7.5–12.5)
Monocytes Relative: 8 %
Neutro Abs: 3061 cells/uL (ref 1500–7800)
Neutrophils Relative %: 53.7 %
Platelets: 192 10*3/uL (ref 140–400)
RBC: 3.98 10*6/uL (ref 3.80–5.10)
RDW: 13.3 % (ref 11.0–15.0)
Total Lymphocyte: 27.1 %
WBC: 5.7 10*3/uL (ref 3.8–10.8)
WBCMIX: 456 {cells}/uL (ref 200–950)

## 2017-11-04 LAB — HEPATIC FUNCTION PANEL
AG RATIO: 2.2 (calc) (ref 1.0–2.5)
ALBUMIN MSPROF: 3.8 g/dL (ref 3.6–5.1)
ALT: 18 U/L (ref 6–29)
AST: 30 U/L (ref 10–35)
Alkaline phosphatase (APISO): 72 U/L (ref 33–130)
BILIRUBIN DIRECT: 0.1 mg/dL (ref 0.0–0.2)
BILIRUBIN TOTAL: 0.4 mg/dL (ref 0.2–1.2)
GLOBULIN: 1.7 g/dL — AB (ref 1.9–3.7)
Indirect Bilirubin: 0.3 mg/dL (calc) (ref 0.2–1.2)
Total Protein: 5.5 g/dL — ABNORMAL LOW (ref 6.1–8.1)

## 2017-11-04 LAB — LIPID PANEL
CHOL/HDL RATIO: 2.5 (calc) (ref <5.0)
Cholesterol: 187 mg/dL (ref <200)
HDL: 76 mg/dL (ref >50)
LDL CHOLESTEROL (CALC): 92 mg/dL
Non-HDL Cholesterol (Calc): 111 mg/dL (calc) (ref <130)
TRIGLYCERIDES: 91 mg/dL (ref <150)

## 2017-11-04 LAB — TSH: TSH: 5.14 m[IU]/L — AB (ref 0.40–4.50)

## 2017-11-04 LAB — HEMOGLOBIN A1C
Hgb A1c MFr Bld: 5.2 % of total Hgb (ref <5.7)
Mean Plasma Glucose: 103 (calc)
eAG (mmol/L): 5.7 (calc)

## 2017-11-04 LAB — VITAMIN D 25 HYDROXY (VIT D DEFICIENCY, FRACTURES): Vit D, 25-Hydroxy: 98 ng/mL (ref 30–100)

## 2017-11-04 LAB — INSULIN, RANDOM: INSULIN: 25 u[IU]/mL — AB (ref 2.0–19.6)

## 2017-11-04 LAB — MAGNESIUM: MAGNESIUM: 1.9 mg/dL (ref 1.5–2.5)

## 2017-11-05 ENCOUNTER — Encounter: Payer: Self-pay | Admitting: Internal Medicine

## 2017-11-05 MED ORDER — DEXAMETHASONE SODIUM PHOSPHATE 10 MG/ML IJ SOLN: 10.0000 mg | Freq: Once | INTRAMUSCULAR | Status: DC

## 2017-11-07 ENCOUNTER — Ambulatory Visit (INDEPENDENT_AMBULATORY_CARE_PROVIDER_SITE_OTHER): Payer: Commercial Managed Care - HMO | Admitting: Physician Assistant

## 2017-11-09 ENCOUNTER — Encounter (INDEPENDENT_AMBULATORY_CARE_PROVIDER_SITE_OTHER): Payer: Self-pay

## 2017-11-11 DIAGNOSIS — M79672 Pain in left foot: Secondary | ICD-10-CM | POA: Diagnosis not present

## 2017-11-11 DIAGNOSIS — M722 Plantar fascial fibromatosis: Secondary | ICD-10-CM | POA: Diagnosis not present

## 2017-11-11 DIAGNOSIS — M79671 Pain in right foot: Secondary | ICD-10-CM | POA: Diagnosis not present

## 2017-11-11 DIAGNOSIS — S93313A Subluxation of tarsal joint of unspecified foot, initial encounter: Secondary | ICD-10-CM | POA: Diagnosis not present

## 2017-11-28 DIAGNOSIS — M79671 Pain in right foot: Secondary | ICD-10-CM | POA: Diagnosis not present

## 2017-11-28 DIAGNOSIS — M722 Plantar fascial fibromatosis: Secondary | ICD-10-CM | POA: Diagnosis not present

## 2017-11-28 DIAGNOSIS — S93313A Subluxation of tarsal joint of unspecified foot, initial encounter: Secondary | ICD-10-CM | POA: Diagnosis not present

## 2017-11-28 DIAGNOSIS — M79672 Pain in left foot: Secondary | ICD-10-CM | POA: Diagnosis not present

## 2017-12-01 DIAGNOSIS — M722 Plantar fascial fibromatosis: Secondary | ICD-10-CM | POA: Diagnosis not present

## 2017-12-01 DIAGNOSIS — M2550 Pain in unspecified joint: Secondary | ICD-10-CM | POA: Diagnosis not present

## 2017-12-01 DIAGNOSIS — M150 Primary generalized (osteo)arthritis: Secondary | ICD-10-CM | POA: Diagnosis not present

## 2017-12-01 DIAGNOSIS — Z6830 Body mass index (BMI) 30.0-30.9, adult: Secondary | ICD-10-CM | POA: Diagnosis not present

## 2017-12-01 DIAGNOSIS — E039 Hypothyroidism, unspecified: Secondary | ICD-10-CM | POA: Diagnosis not present

## 2017-12-01 DIAGNOSIS — Z79899 Other long term (current) drug therapy: Secondary | ICD-10-CM | POA: Diagnosis not present

## 2017-12-01 DIAGNOSIS — M0609 Rheumatoid arthritis without rheumatoid factor, multiple sites: Secondary | ICD-10-CM | POA: Diagnosis not present

## 2017-12-01 DIAGNOSIS — E669 Obesity, unspecified: Secondary | ICD-10-CM | POA: Diagnosis not present

## 2017-12-07 ENCOUNTER — Ambulatory Visit (INDEPENDENT_AMBULATORY_CARE_PROVIDER_SITE_OTHER): Payer: Medicare PPO

## 2017-12-07 DIAGNOSIS — E039 Hypothyroidism, unspecified: Secondary | ICD-10-CM | POA: Diagnosis not present

## 2017-12-07 LAB — TSH: TSH: 2.97 mIU/L (ref 0.40–4.50)

## 2017-12-07 NOTE — Progress Notes (Signed)
Pt reports she takes her thyroid meds as follows:  Five days a wk 1 tablet & two days a wk 1 & half tablet. On an empty stomach in the morning w/ H2o.

## 2017-12-26 DIAGNOSIS — M79671 Pain in right foot: Secondary | ICD-10-CM | POA: Diagnosis not present

## 2017-12-26 DIAGNOSIS — M79672 Pain in left foot: Secondary | ICD-10-CM | POA: Diagnosis not present

## 2017-12-26 DIAGNOSIS — M722 Plantar fascial fibromatosis: Secondary | ICD-10-CM | POA: Diagnosis not present

## 2018-01-07 ENCOUNTER — Other Ambulatory Visit: Payer: Self-pay | Admitting: Physician Assistant

## 2018-01-11 ENCOUNTER — Other Ambulatory Visit: Payer: Self-pay | Admitting: Internal Medicine

## 2018-01-16 DIAGNOSIS — G629 Polyneuropathy, unspecified: Secondary | ICD-10-CM | POA: Diagnosis not present

## 2018-01-16 DIAGNOSIS — M722 Plantar fascial fibromatosis: Secondary | ICD-10-CM | POA: Diagnosis not present

## 2018-01-16 DIAGNOSIS — M79672 Pain in left foot: Secondary | ICD-10-CM | POA: Diagnosis not present

## 2018-01-16 DIAGNOSIS — S93313A Subluxation of tarsal joint of unspecified foot, initial encounter: Secondary | ICD-10-CM | POA: Diagnosis not present

## 2018-01-16 DIAGNOSIS — M79671 Pain in right foot: Secondary | ICD-10-CM | POA: Diagnosis not present

## 2018-01-18 DIAGNOSIS — M5416 Radiculopathy, lumbar region: Secondary | ICD-10-CM | POA: Insufficient documentation

## 2018-02-05 NOTE — Progress Notes (Signed)
FOLLOW UP  Assessment and Plan:   Hypertension Somewhat above goal; currently only taking 25 mg losartan; discussed increasing to full 50 mg daily for goal of control under 130/80 Monitor blood pressure at home; patient to call if consistently greater than 130/80 Continue DASH diet.   Reminder to go to the ER if any CP, SOB, nausea, dizziness, severe HA, changes vision/speech, left arm numbness and tingling and jaw pain.  Cholesterol Currently at goal off of medications and by lifestyle only Continue low cholesterol diet and exercise.  Defer lipid panel.   Other abnormal glucose Recent A1Cs at goal Discussed diet/exercise, weight management  Defer A1C; check BMP  Obesity with co morbidities Long discussion about weight loss, diet, and exercise Recommended diet heavy in fruits and veggies and low in animal meats, cheeses, and dairy products, appropriate calorie intake Discussed ideal weight for height Will follow up in 3 months  Vitamin D Def At goal at last visit; continue supplementation to maintain goal of 70-100 Defer Vit D level  GERD Well managed on current medications; continue PPI for esophagitis and hx of PUD Discussed diet, avoiding triggers and other lifestyle changes Check magnesium, continue to monitor vitamin D   Continue diet and meds as discussed. Further disposition pending results of labs. Discussed med's effects and SE's.   Over 30 minutes of exam, counseling, chart review, and critical decision making was performed.   Future Appointments  Date Time Provider [place]  05/09/2018 10:00 AM [name], MD [id number] None    ----------------------------------------------------------------------------------------------------------------------  HPI 72 y.o. female  presents for 3 month follow up on hypertension, cholesterol, glucose management, weight and vitamin D deficiency. Patient has hx/o Rh Arthritis since 1985 and is on MTX, Plaquenil  and Enbrel currently followed by Dr Lenna Gilford.   she has a diagnosis of insomnia and is currently on melatonin, benadryl and takes 0.5 mg of xanax (has failed multiple agents in the past including trazodone), reports symptoms are well controlled on current regimen. she reports she has been doing very well with this current plan.   she has a diagnosis of GERD with esophagitis and hx of PUD/gastric ulcer for which she continues management by prilosec 40 mg - was prescribed as BID but reports she only takes once daily and once again at night only if having problems.  she reports symptoms is currently well controlled, and denies breakthrough reflux, burning in chest, hoarseness or cough.    BMI is Body mass index is 29.63 kg/m., she has been working on diet- has increased fruit and vegetables considerably, eats very little meat, eats "sensibly." She has not been able to walk due to ongoing plantar fascitis.  Wt Readings from Last 3 Encounters:  02/06/18 162 lb (73.5 kg)  11/03/17 165 lb 8 oz (75.1 kg)  08/04/17 157 lb (71.2 kg)   Her blood pressure has been controlled at home, today their BP is BP: 140/68  She does not workout. She denies chest pain, shortness of breath, dizziness.   She is not on cholesterol medication and denies myalgias. Her cholesterol is at goal. The cholesterol last visit was:   Lab Results  Component Value Date   CHOL 187 11/03/2017   HDL 76 11/03/2017   LDLCALC 92 11/03/2017   TRIG 91 11/03/2017   CHOLHDL 2.5 11/03/2017    She has been working on diet and exercise for glucose management, and denies foot ulcerations, hyperglycemia, increased appetite, nausea, paresthesia of the feet, polyuria, vomiting and weight loss.  Last A1C in the office was:  Lab Results  Component Value Date   HGBA1C 5.2 11/03/2017   She is on thyroid medication. Her medication was not changed last visit.   Lab Results  Component Value Date   TSH 2.97 12/07/2017   Patient is on Vitamin D  supplement and at goal at recent check:   Lab Results  Component Value Date   VD25OH 98 11/03/2017        Current Medications:  Current Outpatient Medications on File Prior to Visit  Medication Sig  . alendronate (FOSAMAX) 70 MG tablet TAKE 1 TABLET ONCE WEEKLY WITH A FULL GLASS OF WATER ON AN EMPTY STOMACH FOR 30 MINUTES.  Marland Kitchen ALPRAZolam (XANAX) 1 MG tablet Take 1/2 to 1 tablet 1 to 2 x / day only if needed for Acute Anxiety Attack and please try to limit to 5 days /week to avoid addiction  . aspirin 81 MG tablet Take 81 mg by mouth daily.  Marland Kitchen b complex vitamins capsule Take 1 capsule by mouth daily.  Marland Kitchen BIOTIN PO Take 1 tablet by mouth daily.  . Cholecalciferol (VITAMIN D PO) Take 5,000 Units by mouth daily.   . folic acid (FOLVITE) 1 MG tablet Take 3 mg by mouth daily.   Marland Kitchen GLUCOSAMINE-CHONDROITIN PO Take 1 tablet by mouth daily.  . hydroxychloroquine (PLAQUENIL) 200 MG tablet Take by mouth daily. Take 1 tab AM and PM  . ibuprofen (ADVIL,MOTRIN) 800 MG tablet Take 1 tablet (800 mg total) by mouth every 8 (eight) hours as needed for mild pain or moderate pain. PRN  . levothyroxine (SYNTHROID, LEVOTHROID) 112 MCG tablet TAKE 1 TABLET DAILY BEFORE BREAKFAST  . losartan (COZAAR) 50 MG tablet TAKE 1 TABLET DAILY FOR BLOOD PRESSURE  . Magnesium 500 MG TABS Take 1 tablet by mouth daily.  Marland Kitchen MELATONIN PO Take 1 tablet by mouth at bedtime.  . methotrexate (RHEUMATREX) 2.5 MG tablet 25 mg once a week.   Marland Kitchen omeprazole (PRILOSEC) 40 MG capsule TAKE 1 CAPSULE TWICE DAILY FOR ACID REFLUX  . OVER THE COUNTER MEDICATION Takes hair, skin and nails 1 time daily.  . Probiotic CAPS Take 1 capsule by mouth daily.  Marland Kitchen etanercept (ENBREL SURECLICK) 50 MG/ML injection Inject 50 mg into the skin once a week.  . naproxen (NAPROSYN) 500 MG tablet Take 1 tablet (500 mg total) by mouth 2 (two) times daily with a meal. (Patient not taking: Reported on 02/06/2018)   Current Facility-Administered Medications on File Prior  to Visit  Medication  . dexamethasone (DECADRON) injection 10 mg  . dexamethasone (DECADRON) injection 10 mg     Allergies:  Allergies  Allergen Reactions  . Ace Inhibitors Cough  . Ambien [Zolpidem] Other (See Comments)    Odd Feeling  . Crestor [Rosuvastatin]   . Pravastatin   . Prozac [Fluoxetine Hcl] Other (See Comments)    Decreased libido  . Zoloft [Sertraline Hcl]      Medical History:  Past Medical History:  Diagnosis Date  . Anemia    Hx of   . Arthritis   . Calculus of bile duct without mention of cholecystitis or obstruction   . Cataract   . GERD (gastroesophageal reflux disease)   . H. pylori infection    Hx of   . Heart murmur   . Hyperlipidemia   . Hypertension   . Hypothyroid   . PUD (peptic ulcer disease)   . Rheumatoid arthritis(714.0)    Family history- Reviewed and unchanged  Social history- Reviewed and unchanged   Review of Systems:  Review of Systems  Constitutional: Negative for malaise/fatigue and weight loss.  HENT: Negative for hearing loss and tinnitus.   Eyes: Negative for blurred vision and double vision.  Respiratory: Negative for cough, shortness of breath and wheezing.   Cardiovascular: Negative for chest pain, palpitations, orthopnea, claudication and leg swelling.  Gastrointestinal: Negative for abdominal pain, blood in stool, constipation, diarrhea, heartburn, melena, nausea and vomiting.  Genitourinary: Negative.   Musculoskeletal: Negative for joint pain and myalgias.  Skin: Negative for rash.  Neurological: Negative for dizziness, tingling, sensory change, weakness and headaches.  Endo/Heme/Allergies: Negative for polydipsia.  Psychiatric/Behavioral: Negative.   All other systems reviewed and are negative.   Physical Exam: BP 140/68   Pulse 70   Temp 97.7 F (36.5 C)   Ht 5\' 2"  (1.575 m)   Wt 162 lb (73.5 kg)   SpO2 98%   BMI 29.63 kg/m  Wt Readings from Last 3 Encounters:  02/06/18 162 lb (73.5 kg)  11/03/17  165 lb 8 oz (75.1 kg)  08/04/17 157 lb (71.2 kg)   General Appearance: Well nourished, in no apparent distress. Eyes: PERRLA, EOMs, conjunctiva no swelling or erythema Sinuses: No Frontal/maxillary tenderness ENT/Mouth: Ext aud canals clear, TMs without erythema, bulging. No erythema, swelling, or exudate on post pharynx.  Tonsils not swollen or erythematous. Hearing normal.  Neck: Supple, thyroid normal.  Respiratory: Respiratory effort normal, BS equal bilaterally without rales, rhonchi, wheezing or stridor.  Cardio: RRR with no MRGs. Brisk peripheral pulses without edema.  Abdomen: Soft, + BS.  Non tender, no guarding, rebound, hernias, masses. Lymphatics: Non tender without lymphadenopathy.  Musculoskeletal: Full ROM, 5/5 strength, Normal gait, bilateral ulnar deviation of finger or bilateral hands without significant active inflammation in joints.  Skin: Warm, dry without rashes, lesions, ecchymosis.  Neuro: Cranial nerves intact. No cerebellar symptoms.  Psych: Awake and oriented X 3, normal affect, Insight and Judgment appropriate.    Izora Ribas, NP 2:55 PM [facility]

## 2018-02-06 ENCOUNTER — Encounter: Payer: Self-pay | Admitting: Adult Health

## 2018-02-06 ENCOUNTER — Ambulatory Visit: Payer: Medicare PPO | Admitting: Adult Health

## 2018-02-06 VITALS — BP 140/68 | HR 70 | Temp 97.7°F | Ht 62.0 in | Wt 162.0 lb

## 2018-02-06 DIAGNOSIS — Z79899 Other long term (current) drug therapy: Secondary | ICD-10-CM | POA: Diagnosis not present

## 2018-02-06 DIAGNOSIS — R7309 Other abnormal glucose: Secondary | ICD-10-CM | POA: Diagnosis not present

## 2018-02-06 DIAGNOSIS — E039 Hypothyroidism, unspecified: Secondary | ICD-10-CM

## 2018-02-06 DIAGNOSIS — E782 Mixed hyperlipidemia: Secondary | ICD-10-CM

## 2018-02-06 DIAGNOSIS — G4700 Insomnia, unspecified: Secondary | ICD-10-CM | POA: Diagnosis not present

## 2018-02-06 DIAGNOSIS — I10 Essential (primary) hypertension: Secondary | ICD-10-CM

## 2018-02-06 DIAGNOSIS — E663 Overweight: Secondary | ICD-10-CM

## 2018-02-06 DIAGNOSIS — E559 Vitamin D deficiency, unspecified: Secondary | ICD-10-CM | POA: Diagnosis not present

## 2018-02-06 DIAGNOSIS — K219 Gastro-esophageal reflux disease without esophagitis: Secondary | ICD-10-CM | POA: Diagnosis not present

## 2018-02-06 NOTE — Patient Instructions (Signed)
Increase to 50 mg losartan daily unless having increased dizziness -    Monitor your blood pressure at home, please keep a record and bring that in with you to your next office visit.   Go to the ER if any CP, SOB, nausea, dizziness, severe HA, changes vision/speech  Due to a recent study, SPRINT, we have changed our goal for the systolic or top blood pressure number. Ideally we want your top number at 120.  In the [facility] Trial, 5000 people were randomized to a goal BP of 120 and 5000 people were randomized to a goal BP of less than 140. The patients with the goal BP at 120 had LESS DEMENTIA, LESS HEART ATTACKS, AND LESS STROKES, AS WELL AS OVERALL DECREASED MORTALITY OR DEATH RATE.   If you are willing, our goal BP is the top number of 120.  Your most recent BP: BP: 140/68   Take your medications faithfully as instructed. Maintain a healthy weight. Get at least 150 minutes of aerobic exercise per week. Minimize salt intake. Minimize alcohol intake  DASH Eating Plan DASH stands for "Dietary Approaches to Stop Hypertension." The DASH eating plan is a healthy eating plan that has been shown to reduce high blood pressure (hypertension). Additional health benefits may include reducing the risk of type 2 diabetes mellitus, heart disease, and stroke. The DASH eating plan may also help with weight loss. WHAT DO I NEED TO KNOW ABOUT THE DASH EATING PLAN? For the DASH eating plan, you will follow these general guidelines:  Choose foods with a percent daily value for sodium of less than 5% (as listed on the food label).  Use salt-free seasonings or herbs instead of table salt or sea salt.  Check with your health care provider or pharmacist before using salt substitutes.  Eat lower-sodium products, often labeled as "lower sodium" or "no salt added."  Eat fresh foods.  Eat more vegetables, fruits, and low-fat dairy products.  Choose whole grains. Look for the word "whole" as the first word in  the ingredient list.  Choose fish and skinless chicken or [place] more often than red meat. Limit fish, poultry, and meat to 6 oz (170 g) each day.  Limit sweets, desserts, sugars, and sugary drinks.  Choose heart-healthy fats.  Limit cheese to 1 oz (28 g) per day.  Eat more home-cooked food and less restaurant, buffet, and fast food.  Limit fried foods.  Cook foods using methods other than frying.  Limit canned vegetables. If you do use them, rinse them well to decrease the sodium.  When eating at a restaurant, ask that your food be prepared with less salt, or no salt if possible. WHAT FOODS CAN I EAT? Seek help from a dietitian for individual calorie needs. Grains Whole grain or whole wheat bread. Brown rice. Whole grain or whole wheat pasta. Quinoa, bulgur, and whole grain cereals. Low-sodium cereals. Corn or whole wheat flour tortillas. Whole grain cornbread. Whole grain crackers. Low-sodium crackers. Vegetables Fresh or frozen vegetables (raw, steamed, roasted, or grilled). Low-sodium or reduced-sodium tomato and vegetable juices. Low-sodium or reduced-sodium tomato sauce and paste. Low-sodium or reduced-sodium canned vegetables.  Fruits All fresh, canned (in natural juice), or frozen fruits. Meat and Other Protein Products Ground beef (85% or leaner), grass-fed beef, or beef trimmed of fat. Skinless chicken or [place]. Ground chicken or [place]. Pork trimmed of fat. All fish and seafood. Eggs. Dried beans, peas, or lentils. Unsalted nuts and seeds. Unsalted canned beans. Dairy Low-fat dairy  products, such as skim or 1% milk, 2% or reduced-fat cheeses, low-fat ricotta or cottage cheese, or plain low-fat yogurt. Low-sodium or reduced-sodium cheeses. Fats and Oils Tub margarines without trans fats. Light or reduced-fat mayonnaise and salad dressings (reduced sodium). Avocado. Safflower, olive, or canola oils. Natural peanut or almond butter. Other Unsalted popcorn and pretzels. The  items listed above may not be a complete list of recommended foods or beverages. Contact your dietitian for more options. WHAT FOODS ARE NOT RECOMMENDED? Grains White bread. White pasta. White rice. Refined cornbread. Bagels and croissants. Crackers that contain trans fat. Vegetables Creamed or fried vegetables. Vegetables in a cheese sauce. Regular canned vegetables. Regular canned tomato sauce and paste. Regular tomato and vegetable juices. Fruits Dried fruits. Canned fruit in light or heavy syrup. Fruit juice. Meat and Other Protein Products Fatty cuts of meat. Ribs, chicken wings, bacon, sausage, bologna, salami, chitterlings, fatback, hot dogs, bratwurst, and packaged luncheon meats. Salted nuts and seeds. Canned beans with salt. Dairy Whole or 2% milk, cream, half-and-half, and cream cheese. Whole-fat or sweetened yogurt. Full-fat cheeses or blue cheese. Nondairy creamers and whipped toppings. Processed cheese, cheese spreads, or cheese curds. Condiments Onion and garlic salt, seasoned salt, table salt, and sea salt. Canned and packaged gravies. Worcestershire sauce. Tartar sauce. Barbecue sauce. Teriyaki sauce. Soy sauce, including reduced sodium. Steak sauce. Fish sauce. Oyster sauce. Cocktail sauce. Horseradish. Ketchup and mustard. Meat flavorings and tenderizers. Bouillon cubes. Hot sauce. Tabasco sauce. Marinades. Taco seasonings. Relishes. Fats and Oils Butter, stick margarine, lard, shortening, ghee, and bacon fat. Coconut, palm kernel, or palm oils. Regular salad dressings. Other Pickles and olives. Salted popcorn and pretzels. The items listed above may not be a complete list of foods and beverages to avoid. Contact your dietitian for more information. WHERE CAN I FIND MORE INFORMATION? National Heart, Lung, and Blood Institute: travelstabloid.com Document Released: 10/14/2011 Document Revised: 03/11/2014 Document Reviewed: 08/29/2013 [facility]  Patient Information 2015 [place], [place]. This information is not intended to replace advice given to you by your health care provider. Make sure you discuss any questions you have with your health care provider.

## 2018-02-07 LAB — HEPATIC FUNCTION PANEL
AG Ratio: 2.2 (calc) (ref 1.0–2.5)
ALKALINE PHOSPHATASE (APISO): 117 U/L (ref 33–130)
ALT: 26 U/L (ref 6–29)
AST: 35 U/L (ref 10–35)
Albumin: 4.1 g/dL (ref 3.6–5.1)
BILIRUBIN INDIRECT: 0.4 mg/dL (ref 0.2–1.2)
Bilirubin, Direct: 0.1 mg/dL (ref 0.0–0.2)
GLOBULIN: 1.9 g/dL (ref 1.9–3.7)
TOTAL PROTEIN: 6 g/dL — AB (ref 6.1–8.1)
Total Bilirubin: 0.5 mg/dL (ref 0.2–1.2)

## 2018-02-07 LAB — CBC WITH DIFFERENTIAL/PLATELET
Basophils Absolute: 59 cells/uL (ref 0–200)
Basophils Relative: 1 %
EOS ABS: 230 {cells}/uL (ref 15–500)
Eosinophils Relative: 3.9 %
HCT: 37.5 % (ref 35.0–45.0)
Hemoglobin: 12.6 g/dL (ref 11.7–15.5)
Lymphs Abs: 1109 cells/uL (ref 850–3900)
MCH: 29.7 pg (ref 27.0–33.0)
MCHC: 33.6 g/dL (ref 32.0–36.0)
MCV: 88.4 fL (ref 80.0–100.0)
MONOS PCT: 7.1 %
MPV: 12.9 fL — ABNORMAL HIGH (ref 7.5–12.5)
Neutro Abs: 4083 cells/uL (ref 1500–7800)
Neutrophils Relative %: 69.2 %
PLATELETS: 199 10*3/uL (ref 140–400)
RBC: 4.24 10*6/uL (ref 3.80–5.10)
RDW: 13.2 % (ref 11.0–15.0)
TOTAL LYMPHOCYTE: 18.8 %
WBC mixed population: 419 cells/uL (ref 200–950)
WBC: 5.9 10*3/uL (ref 3.8–10.8)

## 2018-02-07 LAB — BASIC METABOLIC PANEL WITH GFR
BUN/Creatinine Ratio: 7 (calc) (ref 6–22)
BUN: 7 mg/dL (ref 7–25)
CO2: 30 mmol/L (ref 20–32)
CREATININE: 0.95 mg/dL — AB (ref 0.60–0.93)
Calcium: 10.2 mg/dL (ref 8.6–10.4)
Chloride: 106 mmol/L (ref 98–110)
GFR, EST NON AFRICAN AMERICAN: 61 mL/min/{1.73_m2} (ref >=60)
GFR, Est African American: 70 mL/min/{1.73_m2} (ref >=60)
GLUCOSE: 94 mg/dL (ref 65–99)
Potassium: 4.8 mmol/L (ref 3.5–5.3)
Sodium: 141 mmol/L (ref 135–146)

## 2018-02-07 LAB — TSH: TSH: 1.09 mIU/L (ref 0.40–4.50)

## 2018-02-07 LAB — MAGNESIUM: MAGNESIUM: 2.1 mg/dL (ref 1.5–2.5)

## 2018-02-15 ENCOUNTER — Encounter: Payer: Self-pay | Admitting: Adult Health

## 2018-02-15 ENCOUNTER — Ambulatory Visit: Payer: Medicare PPO | Admitting: Adult Health

## 2018-02-15 VITALS — BP 130/70 | HR 69 | Temp 97.5°F | Ht 62.0 in | Wt 162.0 lb

## 2018-02-15 DIAGNOSIS — M79671 Pain in right foot: Secondary | ICD-10-CM | POA: Diagnosis not present

## 2018-02-15 DIAGNOSIS — M069 Rheumatoid arthritis, unspecified: Secondary | ICD-10-CM | POA: Diagnosis not present

## 2018-02-15 DIAGNOSIS — M79672 Pain in left foot: Secondary | ICD-10-CM | POA: Diagnosis not present

## 2018-02-15 MED ORDER — PREDNISONE 20 MG PO TABS: ORAL_TABLET | ORAL | 0 refills | Status: AC

## 2018-02-15 NOTE — Progress Notes (Signed)
Assessment and Plan:  [name] was seen today for foot pain and foot swelling.  Diagnoses and all orders for this visit:  Heel pain, bilateral/ ?RA flare of ankles Bilateral/symmetric; with scant swelling in ankles/somewhat warm to touch Had similar episode 30 year ago prior to being diagnosed with RA; suspect flare of this today Has upcoming appointment with podiatry and rheumatologist that she can keep  Call with any concerning changes or new symptoms -     predniSONE (DELTASONE) 20 MG tablet; 3 tablets daily with food for 3 days, 2 tabs daily for 3 days, 1 tab a day for 5 days.   Further disposition pending results of labs. Discussed med's effects and SE's.   Over 15 minutes of exam, counseling, chart review, and critical decision making was performed.   Future Appointments  Date Time Provider [place]  05/09/2018 10:00 AM [name], MD [facility] None    ------------------------------------------------------------------------------------------------------------------   HPI BP 130/70   Pulse 69   Temp (!) 97.5 F (36.4 C)   Ht 5\' 2"  (1.575 m)   Wt 162 lb (73.5 kg)   SpO2 98%   BMI 29.63 kg/m   72 y.o.female with hx of RA and osteoporosis presents for bilateral heel pain and mild bilateral lateral ankle swelling x 1 week. She reports no known onset/instigating factor, pain is least in the AM, achy pain without radiation, worse with weight bearing/walking, but also at rest, pain is 3/10-8/10. Heels are somewhat tender as well. She has taken advil which improves pain down to 3/10 but does not fully resolve, has been taking this since onset, along with resting extremities. She has tried applying ice without much perceived benefit. No known instigating event/offensive shoewear, has not been on fluoroquinolones in past 6 months.   She has hx of RA, is on methotrexate and takes plaquenil, is holding off on enbrel at this time due to ?SE of plantar fascitis in Novermber  2018 -February 2019 which had recently resolved. Followed by Dr. Elvina Mattes (podiatry). She is followed by Dr. Trudie Reed for RA. She has appointment with both providers this upcoming month.   Past Medical History:  Diagnosis Date  . Anemia    Hx of   . Arthritis   . Calculus of bile duct without mention of cholecystitis or obstruction   . Cataract   . GERD (gastroesophageal reflux disease)   . H. pylori infection    Hx of   . Heart murmur   . Hyperlipidemia   . Hypertension   . Hypothyroid   . PUD (peptic ulcer disease)   . Rheumatoid arthritis(714.0)      Allergies  Allergen Reactions  . Ace Inhibitors Cough  . Ambien [Zolpidem] Other (See Comments)    Odd Feeling  . Crestor [Rosuvastatin]   . Pravastatin   . Prozac [Fluoxetine Hcl] Other (See Comments)    Decreased libido  . Zoloft [Sertraline Hcl]     Current Outpatient Medications on File Prior to Visit  Medication Sig  . alendronate (FOSAMAX) 70 MG tablet TAKE 1 TABLET ONCE WEEKLY WITH A FULL GLASS OF WATER ON AN EMPTY STOMACH FOR 30 MINUTES.  Marland Kitchen ALPRAZolam (XANAX) 1 MG tablet Take 1/2 to 1 tablet 1 to 2 x / day only if needed for Acute Anxiety Attack and please try to limit to 5 days /week to avoid addiction  . b complex vitamins capsule Take 1 capsule by mouth daily.  Marland Kitchen BIOTIN PO Take 1 tablet by mouth daily.  Marland Kitchen  Cholecalciferol (VITAMIN D PO) Take 5,000 Units by mouth daily.   Marland Kitchen etanercept (ENBREL SURECLICK) 50 MG/ML injection Inject 50 mg into the skin once a week.  . folic acid (FOLVITE) 1 MG tablet Take 3 mg by mouth daily.   Marland Kitchen GLUCOSAMINE-CHONDROITIN PO Take 1 tablet by mouth daily.  . hydroxychloroquine (PLAQUENIL) 200 MG tablet Take by mouth daily. Take 1 tab AM and PM  . ibuprofen (ADVIL,MOTRIN) 800 MG tablet Take 1 tablet (800 mg total) by mouth every 8 (eight) hours as needed for mild pain or moderate pain. PRN  . levothyroxine (SYNTHROID, LEVOTHROID) 112 MCG tablet TAKE 1 TABLET DAILY BEFORE BREAKFAST  .  losartan (COZAAR) 50 MG tablet TAKE 1 TABLET DAILY FOR BLOOD PRESSURE  . Magnesium 500 MG TABS Take 1 tablet by mouth daily.  Marland Kitchen MELATONIN PO Take 1 tablet by mouth at bedtime.  . methotrexate (RHEUMATREX) 2.5 MG tablet 25 mg once a week.   Marland Kitchen omeprazole (PRILOSEC) 40 MG capsule TAKE 1 CAPSULE TWICE DAILY FOR ACID REFLUX  . OVER THE COUNTER MEDICATION Takes hair, skin and nails 1 time daily.  . Probiotic CAPS Take 1 capsule by mouth daily.  Marland Kitchen aspirin 81 MG tablet Take 81 mg by mouth daily.  . naproxen (NAPROSYN) 500 MG tablet Take 1 tablet (500 mg total) by mouth 2 (two) times daily with a meal. (Patient not taking: Reported on 02/15/2018)   Current Facility-Administered Medications on File Prior to Visit  Medication  . dexamethasone (DECADRON) injection 10 mg  . dexamethasone (DECADRON) injection 10 mg    ROS: all negative except above.   Physical Exam:  BP 130/70   Pulse 69   Temp (!) 97.5 F (36.4 C)   Ht 5\' 2"  (1.575 m)   Wt 162 lb (73.5 kg)   SpO2 98%   BMI 29.63 kg/m   General Appearance: Well nourished, in no apparent distress. Neck: Supple.  Respiratory: Respiratory effort normal, BS equal bilaterally without rales, rhonchi, wheezing or stridor.  Cardio: RRR with no MRGs. Brisk peripheral pulses without edema.  Abdomen: Soft, + BS.  Non tender. Lymphatics: Non tender without lymphadenopathy.  Musculoskeletal: Full ROM, 5/5 strength, normal gait. Some tenderness to posteriolateral calcaneous vs calcaneous tendon attachment, scant nonpitting edema to lateral ankles, faintly warm to touch, no distinct bony abnormality.  Skin: Warm, dry without rashes, lesions, ecchymosis.  Neuro: Cranial nerves intact. Normal muscle tone, no cerebellar symptoms. Sensation intact.  Psych: Awake and oriented X 3, normal affect, Insight and Judgment appropriate.     Izora Ribas, NP 4:20 PM [facility]

## 2018-02-15 NOTE — Patient Instructions (Signed)
Rheumatoid Arthritis Rheumatoid arthritis (RA) is a long-term (chronic) disease that causes inflammation in your joints. RA may start slowly. It usually affects the small joints of the hands and feet. Usually, the same joints are affected on both sides of your body. Inflammation from RA can also affect other parts of your body, including your heart, eyes, or lungs. RA is an autoimmune disease. That means that your body's defense system (immune system) mistakenly attacks healthy body tissues. There is no cure for RA, but medicines can help your symptoms and halt or slow down the progression of the disease. What are the causes? The exact cause of RA is not known. What increases the risk? This condition is more likely to develop in:  Women.  People who have a family history of RA or other autoimmune diseases.  What are the signs or symptoms? Symptoms of this condition vary from person to person. Symptoms usually start gradually. They are often worse in the morning. The first symptom may be morning stiffness that lasts longer than 30 minutes. As RA progresses, symptoms may include:  Pain, stiffness, swelling, warmth, and tenderness in joints on both sides of your body.  Loss of energy.  Loss of appetite.  Weight loss.  Low-grade fever.  Dry eyes and dry mouth.  Firm lumps (rheumatoid nodules) that grow beneath your skin in areas such as your forearm bones near your elbows and on your hands.  Changes in the appearance of joints (deformity) and loss of joint function.  Symptoms of RA often come and go. Sometimes, symptoms get worse for a period of time. These are called flares. How is this diagnosed? This condition is diagnosed based on your symptoms, medical history, and physical exam. You may have X-rays or MRI to check for the type of joint changes that are caused by RA. You may also have blood tests to look for:  Proteins (antibodies) that your immune system may make if you have RA.  They include rheumatoid factor (RF) and anti-CCP. ? When blood tests show these proteins, you are said to have "seropositive RA." ? When blood tests do not show these proteins, you may have "seronegative RA."  Inflammation in your blood.  A low number of red blood cells (anemia).  How is this treated? The goals of treatment are to relieve pain, reduce inflammation, and slow down or stop joint damage and disability. Treatment may include:  Lifestyle changes. It is important to rest, eat a healthy diet, and exercise.  Medicines. Your health care provider may adjust your medicines every 3 months until treatment goals are reached. Common medicines include: ? Pain relievers (analgesics). ? Corticosteroids and NSAIDs to reduce inflammation. ? Disease-modifying antirheumatic drugs (DMARDs) to try to slow the course of the disease. ? Biologic response modifiers to reduce inflammation and damage.  Physical therapy and occupational therapy.  Surgery, if you have severe joint damage. Joint replacement or fusing of joints may be needed.  Your health care provider will work with you to identify the best treatment option for you based on assessment of the overall disease activity in your body. Follow these instructions at home:  Take over-the-counter and prescription medicines only as told by your health care provider.  Start an exercise program as told by your health care provider.  Rest when you are having a flare.  Return to your normal activities as told by your health care provider. Ask your health care provider what activities are safe for you.  Keep all   follow-up visits as told by your health care provider. This is important. Where to find more information:  American College of Rheumatology: www.rheumatology.org  Arthritis Foundation: www.arthritis.org Contact a health care provider if:  You have a flare-up of RA symptoms.  You have a fever.  You have side effects from your  medicines. Get help right away if:  You have chest pain.  You have trouble breathing.  You quickly develop a hot, painful joint that is more severe than your usual joint aches. This information is not intended to replace advice given to you by your health care provider. Make sure you discuss any questions you have with your health care provider. Document Released: 10/22/2000 Document Revised: 03/30/2016 Document Reviewed: 08/07/2015 [employer] Interactive Patient Education  2018 [employer]   

## 2018-02-23 ENCOUNTER — Encounter: Payer: Self-pay | Admitting: Internal Medicine

## 2018-03-06 DIAGNOSIS — Z6829 Body mass index (BMI) 29.0-29.9, adult: Secondary | ICD-10-CM | POA: Diagnosis not present

## 2018-03-06 DIAGNOSIS — M2550 Pain in unspecified joint: Secondary | ICD-10-CM | POA: Diagnosis not present

## 2018-03-06 DIAGNOSIS — M150 Primary generalized (osteo)arthritis: Secondary | ICD-10-CM | POA: Diagnosis not present

## 2018-03-06 DIAGNOSIS — Z79899 Other long term (current) drug therapy: Secondary | ICD-10-CM | POA: Diagnosis not present

## 2018-03-06 DIAGNOSIS — M0609 Rheumatoid arthritis without rheumatoid factor, multiple sites: Secondary | ICD-10-CM | POA: Diagnosis not present

## 2018-03-06 DIAGNOSIS — E663 Overweight: Secondary | ICD-10-CM | POA: Diagnosis not present

## 2018-03-12 ENCOUNTER — Encounter: Payer: Self-pay | Admitting: Gastroenterology

## 2018-03-14 ENCOUNTER — Other Ambulatory Visit: Payer: Self-pay | Admitting: Podiatry

## 2018-03-14 DIAGNOSIS — M79671 Pain in right foot: Secondary | ICD-10-CM | POA: Diagnosis not present

## 2018-03-14 DIAGNOSIS — M84374A Stress fracture, right foot, initial encounter for fracture: Secondary | ICD-10-CM

## 2018-03-23 ENCOUNTER — Ambulatory Visit
Admission: RE | Admit: 2018-03-23 | Discharge: 2018-03-23 | Disposition: A | Discharge status: Home / Self Care | Payer: Medicare PPO | Source: Ambulatory Visit | Attending: Podiatry | Admitting: Podiatry

## 2018-03-23 DIAGNOSIS — M84374A Stress fracture, right foot, initial encounter for fracture: Secondary | ICD-10-CM | POA: Diagnosis not present

## 2018-03-23 DIAGNOSIS — M722 Plantar fascial fibromatosis: Secondary | ICD-10-CM | POA: Diagnosis not present

## 2018-04-08 ENCOUNTER — Other Ambulatory Visit: Payer: Self-pay | Admitting: Physician Assistant

## 2018-04-11 DIAGNOSIS — M84374A Stress fracture, right foot, initial encounter for fracture: Secondary | ICD-10-CM | POA: Diagnosis not present

## 2018-04-11 DIAGNOSIS — M79671 Pain in right foot: Secondary | ICD-10-CM | POA: Diagnosis not present

## 2018-04-26 ENCOUNTER — Encounter (INDEPENDENT_AMBULATORY_CARE_PROVIDER_SITE_OTHER): Payer: Self-pay

## 2018-04-27 ENCOUNTER — Other Ambulatory Visit: Payer: Self-pay | Admitting: Internal Medicine

## 2018-04-27 DIAGNOSIS — Z1231 Encounter for screening mammogram for malignant neoplasm of breast: Secondary | ICD-10-CM

## 2018-05-03 DIAGNOSIS — M79671 Pain in right foot: Secondary | ICD-10-CM | POA: Diagnosis not present

## 2018-05-03 DIAGNOSIS — G629 Polyneuropathy, unspecified: Secondary | ICD-10-CM | POA: Diagnosis not present

## 2018-05-03 DIAGNOSIS — M84374D Stress fracture, right foot, subsequent encounter for fracture with routine healing: Secondary | ICD-10-CM | POA: Diagnosis not present

## 2018-05-07 ENCOUNTER — Encounter: Payer: Self-pay | Admitting: Internal Medicine

## 2018-05-07 NOTE — Patient Instructions (Addendum)

## 2018-05-07 NOTE — Progress Notes (Signed)
[facility] [name], M.D.     [name]. [name], P.A.-C [name], [name] [address] [place], [place] [address] Telephone [phone] Telefax [phone] Annual Screening/Preventative Visit & Comprehensive Evaluation &  Examination     This very nice 72 y.o. MWF  presents for a Screening /Preventative Visit & comprehensive evaluation and management of multiple medical co-morbidities.  Patient has been followed for HTN, HLD, Prediabetes  and Vitamin D Deficiency. Currently, patient is followed by Dr Berna Bue for hx/o Rheumatoid Arthritis predating since 1985. Patient has been on MTX and Plaquenil and more recently Enbrel. She also is on Fosamax for hx/o Osteoporosis.      HTN predates circa 1997. Patient's BP has been controlled at home and patient denies any cardiac symptoms as chest pain, palpitations, shortness of breath, dizziness or ankle swelling. Today's        Patient's hyperlipidemia is controlled with diet and medications. Patient denies myalgias or other medication SE's. Last lipids were at goal: Lab Results  Component Value Date   CHOL 187 11/03/2017   HDL 76 11/03/2017   LDLCALC 92 11/03/2017   TRIG 91 11/03/2017   CHOLHDL 2.5 11/03/2017      Patient has prediabetes predating (A1c 5.8% in 2011) and patient denies reactive hypoglycemic symptoms, visual blurring, diabetic polys, or paresthesias. Last A1c was Normal & at goal: Lab Results  Component Value Date   HGBA1C 5.2 11/03/2017      Patient was dx'd Hypothyroid in 35's  and has been on replacement since. Finally, patient has history of Vitamin D Deficiency ("38" in 2008) and last Vitamin D was at goal: Lab Results  Component Value Date   VD25OH 98 11/03/2017   Current Outpatient Medications on File Prior to Visit  Medication Sig  . alendronate (FOSAMAX) 70 MG tablet TAKE 1 TABLET ONCE WEEKLY WITH A FULL GLASS OF  WATER ON AN EMPTY STOMACH FOR 30 MINUTES.  Marland Kitchen ALPRAZolam (XANAX) 1 MG tablet Take 1/2 to 1 tablet 1 to 2 x / day only if needed for Acute Anxiety Attack and please try to limit to 5 days /week to avoid addiction  . aspirin 81 MG tablet Take 81 mg by mouth daily.  Marland Kitchen b complex vitamins capsule Take 1 capsule by mouth daily.  Marland Kitchen BIOTIN PO Take 1 tablet by mouth daily.  . Cholecalciferol (VITAMIN D PO) Take 5,000 Units by mouth daily.   Marland Kitchen etanercept (ENBREL SURECLICK) 50 MG/ML injection Inject 50 mg into the skin once a week.  . folic acid (FOLVITE) 1 MG tablet Take 3 mg by mouth daily.   Marland Kitchen GLUCOSAMINE-CHONDROITIN PO Take 1 tablet by mouth daily.  . hydroxychloroquine (PLAQUENIL) 200 MG tablet Take by mouth daily. Take 1 tab AM and PM  . ibuprofen (ADVIL,MOTRIN) 800 MG tablet Take 1 tablet (800 mg total) by mouth every 8 (eight) hours as needed for mild pain or moderate pain. PRN  . levothyroxine (SYNTHROID, LEVOTHROID) 112 MCG tablet TAKE 1 TABLET DAILY BEFORE BREAKFAST  . losartan (COZAAR) 50 MG tablet TAKE 1 TABLET DAILY FOR BLOOD PRESSURE  . Magnesium 500 MG TABS Take 1 tablet by mouth daily.  Marland Kitchen MELATONIN PO Take 1 tablet by mouth at bedtime.  . methotrexate (RHEUMATREX) 2.5 MG tablet 25 mg once a week.   . naproxen (NAPROSYN) 500 MG tablet Take 1 tablet (500 mg total) by mouth 2 (two) times daily with a meal. (Patient not taking:  Reported on 02/15/2018)  . omeprazole (PRILOSEC) 40 MG capsule TAKE 1 CAPSULE TWICE DAILY FOR ACID REFLUX  . OVER THE COUNTER MEDICATION Takes hair, skin and nails 1 time daily.  . Probiotic CAPS Take 1 capsule by mouth daily.   Allergies  Allergen Reactions  . Ace Inhibitors Cough  . Ambien [Zolpidem] Other (See Comments)    Odd Feeling  . Crestor [Rosuvastatin]   . Pravastatin   . Prozac [Fluoxetine Hcl] Other (See Comments)    Decreased libido  . Zoloft [Sertraline Hcl]    Past Medical History:  Diagnosis Date  . Anemia    Hx of   . Arthritis   .  Calculus of bile duct without mention of cholecystitis or obstruction   . Cataract   . GERD (gastroesophageal reflux disease)   . H. pylori infection    Hx of   . Heart murmur   . Hyperlipidemia   . Hypertension   . Hypothyroid   . PUD (peptic ulcer disease)   . Rheumatoid arthritis(714.0)    Health Maintenance  Topic Date Due  . Hepatitis C Screening  [birth year]  . COLONOSCOPY  03/08/2018  . INFLUENZA VACCINE  06/08/2018  . MAMMOGRAM  07/13/2018  . TETANUS/TDAP  11/08/2022  . DEXA SCAN  Completed  . PNA vac Low Risk Adult  Completed   Immunization History  Administered Date(s) Administered  . Influenza, High Dose Seasonal PF 07/16/2014, 09/17/2015, 07/01/2016, 08/04/2017  . Pneumococcal Conjugate-13 10/16/2015  . Pneumococcal Polysaccharide-23 11/08/2012  . Td 11/08/2012  . Zoster 09/17/2015   Last Colon - 03/08/2013 - Dr Deatra Ina - (+) adenoma polyp - recc f/u due May 2019 - Overdue Last MGM - sch 05/24/2018  Past Surgical History:  Procedure Laterality Date  . AV FISTULA REPAIR    . BLADDER SUSPENSION    . BUNIONECTOMY    . CARPAL TUNNEL RELEASE    . CHOLECYSTECTOMY  09/07/2011  . CYSTECTOMY     left breast  . ERCP  09/29/2011   Procedure: ENDOSCOPIC RETROGRADE CHOLANGIOPANCREATOGRAPHY (ERCP);  Surgeon: Inda Castle, MD;  Location: [facility];  Service: Endoscopy;  Laterality: N/A;  . PILONIDAL CYST EXCISION    . TENOLYSIS  10/26/2011   Procedure: TENDON SHEATH RELEASE/TENOLYSIS;  Surgeon: Wynonia Sours, MD;  Location: [facility];  Service: Orthopedics;  Laterality: Right;  tenosynovectomy removal superficialis slip right index finger  . TONSILLECTOMY    . TOTAL SHOULDER REPLACEMENT Right 02/22/2014   Dr. Enrigue Catena at [facility]  . TRIGGER FINGER RELEASE    . VAGINAL HYSTERECTOMY     ovaries not removed   Family History  Problem Relation Age of Onset  . Lung cancer Mother   . Stomach cancer Father   . Heart attack Father        MI at age 48  .  Colon cancer Maternal Grandfather   . Heart attack Brother        MI at age 76  . Prostate cancer Paternal Grandfather    Social History   Tobacco Use  . Smoking status: Former Smoker    Last attempt to quit: 10/25/1995    Years since quitting: 22.5  . Smokeless tobacco: Never Used  . Tobacco comment: Maryann Conners [id number]  Substance Use Topics  . Alcohol use: Yes    Alcohol/week: 0.0 oz    Comment: occasional  . Drug use: No    ROS Constitutional: Denies fever, chills, weight loss/gain, headaches, insomnia,  night sweats, and change  in appetite. Does c/o fatigue. Eyes: Denies redness, blurred vision, diplopia, discharge, itchy, watery eyes.  ENT: Denies discharge, congestion, post nasal drip, epistaxis, sore throat, earache, hearing loss, dental pain, Tinnitus, Vertigo, Sinus pain, snoring.  Cardio: Denies chest pain, palpitations, irregular heartbeat, syncope, dyspnea, diaphoresis, orthopnea, PND, claudication, edema Respiratory: denies cough, dyspnea, DOE, pleurisy, hoarseness, laryngitis, wheezing.  Gastrointestinal: Denies dysphagia, heartburn, reflux, water brash, pain, cramps, nausea, vomiting, bloating, diarrhea, constipation, hematemesis, melena, hematochezia, jaundice, hemorrhoids Genitourinary: Denies dysuria, frequency, urgency, nocturia, hesitancy, discharge, hematuria, flank pain Breast: Breast lumps, nipple discharge, bleeding.  Musculoskeletal: Denies arthralgia, myalgia, stiffness, Jt. Swelling, pain, limp, and strain/sprain. Denies falls. Skin: Denies puritis, rash, hives, warts, acne, eczema, changing in skin lesion Neuro: No weakness, tremor, incoordination, spasms, paresthesia, pain Psychiatric: Denies confusion, memory loss, sensory loss. Denies Depression. Endocrine: Denies change in weight, skin, hair change, nocturia, and paresthesia, diabetic polys, visual blurring, hyper / hypo glycemic episodes.  Heme/Lymph: No excessive bleeding, bruising, enlarged lymph  nodes.  Physical Exam  There were no vitals taken for this visit.  General Appearance: Well nourished, well groomed and in no apparent distress.  Eyes: PERRLA, EOMs, conjunctiva no swelling or erythema, normal fundi and vessels. Sinuses: No frontal/maxillary tenderness ENT/Mouth: EACs patent / TMs  nl. Nares clear without erythema, swelling, mucoid exudates. Oral hygiene is good. No erythema, swelling, or exudate. Tongue normal, non-obstructing. Tonsils not swollen or erythematous. Hearing normal.  Neck: Supple, thyroid not palpable. No bruits, nodes or JVD. Respiratory: Respiratory effort normal.  BS equal and clear bilateral without rales, rhonci, wheezing or stridor. Cardio: Heart sounds are normal with regular rate and rhythm and no murmurs, rubs or gallops. Peripheral pulses are normal and equal bilaterally without edema. No aortic or femoral bruits. Chest: symmetric with normal excursions and percussion. Breasts: Symmetric, without lumps, nipple discharge, retractions, or fibrocystic changes.  Abdomen: Flat, soft with bowel sounds active. Nontender, no guarding, rebound, hernias, masses, or organomegaly.  Lymphatics: Non tender without lymphadenopathy.  Genitourinary:  Musculoskeletal: Full ROM all peripheral extremities, joint stability, 5/5 strength, and normal gait. Skin: Warm and dry without rashes, lesions, cyanosis, clubbing or  ecchymosis.  Neuro: Cranial nerves intact, reflexes equal bilaterally. Normal muscle tone, no cerebellar symptoms. Sensation intact.  Pysch: Alert and oriented X 3, normal affect, Insight and Judgment appropriate.   Assessment and Plan  1. Annual Preventative Screening Examination  2. Essential hypertension  - CBC with Differential/Platelet - COMPLETE METABOLIC PANEL WITH GFR - Magnesium - TSH - EKG 12-Lead - Urinalysis, Routine w reflex microscopic - Microalbumin / creatinine urine ratio  3. Hyperlipidemia, mixed  - Lipid panel - TSH -  EKG 12-Lead  4. Abnormal glucose  - Hemoglobin A1c - Insulin, random - EKG 12-Lead  5. Vitamin D deficiency  - VITAMIN D 25 Hydroxyl  6. Rheumatoid arthritis involving multiple sites ([place])   7. Hypothyroidism  - TSH  8. Gastroesophageal reflux disease  - CBC with Differential/Platelet  9. Screening for colorectal cancer  - POC Hemoccult Bld/Stl  10. Screening for ischemic heart disease  - EKG 12-Lead  11. FHx: heart disease  - EKG 12-Lead  12. Former smoker  - EKG 12-Lead  13. Osteoporosis  14. Medication management  - CBC with Differential/Platelet - COMPLETE METABOLIC PANEL WITH GFR - Magnesium - Lipid panel - TSH - Hemoglobin A1c - Insulin, random - VITAMIN D 25 Hydroxyl - Urinalysis, Routine w reflex microscopic - Microalbumin / creatinine urine ratio  Patient was counseled in prudent diet to achieve/maintain BMI less than 25 for weight control, BP monitoring, regular exercise and medications. Discussed med's effects and SE's. Screening labs and tests as requested with regular follow-up as recommended. Over 40 minutes of exam, counseling, chart review and high complex critical decision making was performed.

## 2018-05-09 ENCOUNTER — Ambulatory Visit (INDEPENDENT_AMBULATORY_CARE_PROVIDER_SITE_OTHER): Payer: Medicare PPO | Admitting: Internal Medicine

## 2018-05-09 ENCOUNTER — Encounter: Payer: Self-pay | Admitting: Internal Medicine

## 2018-05-09 VITALS — BP 138/74 | HR 60 | Temp 97.5°F | Resp 16 | Ht 61.5 in | Wt 158.8 lb

## 2018-05-09 DIAGNOSIS — Z87891 Personal history of nicotine dependence: Secondary | ICD-10-CM

## 2018-05-09 DIAGNOSIS — I10 Essential (primary) hypertension: Secondary | ICD-10-CM

## 2018-05-09 DIAGNOSIS — E782 Mixed hyperlipidemia: Secondary | ICD-10-CM

## 2018-05-09 DIAGNOSIS — M069 Rheumatoid arthritis, unspecified: Secondary | ICD-10-CM

## 2018-05-09 DIAGNOSIS — E039 Hypothyroidism, unspecified: Secondary | ICD-10-CM

## 2018-05-09 DIAGNOSIS — Z0000 Encounter for general adult medical examination without abnormal findings: Secondary | ICD-10-CM | POA: Diagnosis not present

## 2018-05-09 DIAGNOSIS — Z1211 Encounter for screening for malignant neoplasm of colon: Secondary | ICD-10-CM

## 2018-05-09 DIAGNOSIS — R7309 Other abnormal glucose: Secondary | ICD-10-CM | POA: Diagnosis not present

## 2018-05-09 DIAGNOSIS — Z8249 Family history of ischemic heart disease and other diseases of the circulatory system: Secondary | ICD-10-CM

## 2018-05-09 DIAGNOSIS — M810 Age-related osteoporosis without current pathological fracture: Secondary | ICD-10-CM

## 2018-05-09 DIAGNOSIS — K219 Gastro-esophageal reflux disease without esophagitis: Secondary | ICD-10-CM | POA: Diagnosis not present

## 2018-05-09 DIAGNOSIS — Z1212 Encounter for screening for malignant neoplasm of rectum: Secondary | ICD-10-CM

## 2018-05-09 DIAGNOSIS — Z136 Encounter for screening for cardiovascular disorders: Secondary | ICD-10-CM | POA: Diagnosis not present

## 2018-05-09 DIAGNOSIS — E559 Vitamin D deficiency, unspecified: Secondary | ICD-10-CM | POA: Diagnosis not present

## 2018-05-09 DIAGNOSIS — Z79899 Other long term (current) drug therapy: Secondary | ICD-10-CM | POA: Diagnosis not present

## 2018-05-09 DIAGNOSIS — Z0001 Encounter for general adult medical examination with abnormal findings: Secondary | ICD-10-CM

## 2018-05-09 DIAGNOSIS — D126 Benign neoplasm of colon, unspecified: Secondary | ICD-10-CM

## 2018-05-10 ENCOUNTER — Encounter: Payer: Self-pay | Admitting: Internal Medicine

## 2018-05-10 LAB — LIPID PANEL
CHOLESTEROL: 166 mg/dL (ref <200)
HDL: 69 mg/dL (ref >50)
LDL CHOLESTEROL (CALC): 82 mg/dL
Non-HDL Cholesterol (Calc): 97 mg/dL (calc) (ref <130)
TRIGLYCERIDES: 69 mg/dL (ref <150)
Total CHOL/HDL Ratio: 2.4 (calc) (ref <5.0)

## 2018-05-10 LAB — CBC WITH DIFFERENTIAL/PLATELET
Basophils Absolute: 30 cells/uL (ref 0–200)
Basophils Relative: 0.8 %
EOS ABS: 289 {cells}/uL (ref 15–500)
Eosinophils Relative: 7.8 %
HEMATOCRIT: 34.5 % — AB (ref 35.0–45.0)
HEMOGLOBIN: 11.5 g/dL — AB (ref 11.7–15.5)
Lymphs Abs: 1210 cells/uL (ref 850–3900)
MCH: 29.1 pg (ref 27.0–33.0)
MCHC: 33.3 g/dL (ref 32.0–36.0)
MCV: 87.3 fL (ref 80.0–100.0)
MPV: 13.6 fL — AB (ref 7.5–12.5)
Monocytes Relative: 13.4 %
NEUTROS ABS: 1676 {cells}/uL (ref 1500–7800)
Neutrophils Relative %: 45.3 %
Platelets: 134 10*3/uL — ABNORMAL LOW (ref 140–400)
RBC: 3.95 10*6/uL (ref 3.80–5.10)
RDW: 14.3 % (ref 11.0–15.0)
Total Lymphocyte: 32.7 %
WBC: 3.7 10*3/uL — ABNORMAL LOW (ref 3.8–10.8)
WBCMIX: 496 {cells}/uL (ref 200–950)

## 2018-05-10 LAB — TSH: TSH: 0.17 mIU/L — ABNORMAL LOW (ref 0.40–4.50)

## 2018-05-10 LAB — MICROALBUMIN / CREATININE URINE RATIO
Creatinine, Urine: 32 mg/dL (ref 20–275)
Microalb, Ur: <0.2 mg/dL

## 2018-05-10 LAB — COMPLETE METABOLIC PANEL WITH GFR
AG Ratio: 2.3 (calc) (ref 1.0–2.5)
ALBUMIN MSPROF: 4.1 g/dL (ref 3.6–5.1)
ALKALINE PHOSPHATASE (APISO): 117 U/L (ref 33–130)
ALT: 18 U/L (ref 6–29)
AST: 28 U/L (ref 10–35)
BUN: 7 mg/dL (ref 7–25)
CO2: 29 mmol/L (ref 20–32)
CREATININE: 0.93 mg/dL (ref 0.60–0.93)
Calcium: 10.3 mg/dL (ref 8.6–10.4)
Chloride: 106 mmol/L (ref 98–110)
GFR, Est African American: 72 mL/min/{1.73_m2} (ref >=60)
GFR, Est Non African American: 62 mL/min/{1.73_m2} (ref >=60)
GLUCOSE: 93 mg/dL (ref 65–99)
Globulin: 1.8 g/dL (calc) — ABNORMAL LOW (ref 1.9–3.7)
Potassium: 4.4 mmol/L (ref 3.5–5.3)
Sodium: 141 mmol/L (ref 135–146)
Total Bilirubin: 0.6 mg/dL (ref 0.2–1.2)
Total Protein: 5.9 g/dL — ABNORMAL LOW (ref 6.1–8.1)

## 2018-05-10 LAB — URINALYSIS, ROUTINE W REFLEX MICROSCOPIC
Bilirubin Urine: NEGATIVE
GLUCOSE, UA: NEGATIVE
Hgb urine dipstick: NEGATIVE
KETONES UR: NEGATIVE
Leukocytes, UA: NEGATIVE
Nitrite: NEGATIVE
PROTEIN: NEGATIVE
Specific Gravity, Urine: 1.008 (ref 1.001–1.03)
pH: 6 (ref 5.0–8.0)

## 2018-05-10 LAB — HEMOGLOBIN A1C
EAG (MMOL/L): 5.4 (calc)
HEMOGLOBIN A1C: 5 %{Hb} (ref <5.7)
Mean Plasma Glucose: 97 (calc)

## 2018-05-10 LAB — INSULIN, RANDOM: INSULIN: 7.5 u[IU]/mL (ref 2.0–19.6)

## 2018-05-10 LAB — MAGNESIUM: MAGNESIUM: 2 mg/dL (ref 1.5–2.5)

## 2018-05-10 LAB — VITAMIN D 25 HYDROXY (VIT D DEFICIENCY, FRACTURES): VIT D 25 HYDROXY: 80 ng/mL (ref 30–100)

## 2018-05-24 ENCOUNTER — Ambulatory Visit
Admission: RE | Admit: 2018-05-24 | Discharge: 2018-05-24 | Disposition: A | Discharge status: Home / Self Care | Payer: Medicare PPO | Source: Ambulatory Visit | Attending: Internal Medicine | Admitting: Internal Medicine

## 2018-05-24 DIAGNOSIS — M79671 Pain in right foot: Secondary | ICD-10-CM | POA: Diagnosis not present

## 2018-05-24 DIAGNOSIS — Z1231 Encounter for screening mammogram for malignant neoplasm of breast: Secondary | ICD-10-CM

## 2018-05-24 DIAGNOSIS — M84374D Stress fracture, right foot, subsequent encounter for fracture with routine healing: Secondary | ICD-10-CM | POA: Diagnosis not present

## 2018-06-05 ENCOUNTER — Other Ambulatory Visit: Payer: Self-pay

## 2018-06-05 DIAGNOSIS — Z1212 Encounter for screening for malignant neoplasm of rectum: Principal | ICD-10-CM

## 2018-06-05 DIAGNOSIS — Z1211 Encounter for screening for malignant neoplasm of colon: Secondary | ICD-10-CM | POA: Diagnosis not present

## 2018-06-05 LAB — POC HEMOCCULT BLD/STL (HOME/3-CARD/SCREEN)
Card #3 Fecal Occult Blood, POC: NEGATIVE
FECAL OCCULT BLD: NEGATIVE
Fecal Occult Blood, POC: NEGATIVE

## 2018-06-12 DIAGNOSIS — E663 Overweight: Secondary | ICD-10-CM | POA: Diagnosis not present

## 2018-06-12 DIAGNOSIS — M150 Primary generalized (osteo)arthritis: Secondary | ICD-10-CM | POA: Diagnosis not present

## 2018-06-12 DIAGNOSIS — M2550 Pain in unspecified joint: Secondary | ICD-10-CM | POA: Diagnosis not present

## 2018-06-12 DIAGNOSIS — Z79899 Other long term (current) drug therapy: Secondary | ICD-10-CM | POA: Diagnosis not present

## 2018-06-12 DIAGNOSIS — M0609 Rheumatoid arthritis without rheumatoid factor, multiple sites: Secondary | ICD-10-CM | POA: Diagnosis not present

## 2018-06-12 DIAGNOSIS — Z6828 Body mass index (BMI) 28.0-28.9, adult: Secondary | ICD-10-CM | POA: Diagnosis not present

## 2018-07-15 ENCOUNTER — Other Ambulatory Visit: Payer: Self-pay | Admitting: Internal Medicine

## 2018-08-11 ENCOUNTER — Ambulatory Visit: Payer: Self-pay | Admitting: Adult Health

## 2018-08-14 NOTE — Progress Notes (Signed)
MEDICARE ANNUAL WELLNESS VISIT AND FOLLOW UP  Assessment:   Encounter for annual medicare wellness visit  HYPERTENSION Increase losartan back up to 50 mg daily Monitor blood pressure at home; call if consistently over 130/80 Continue DASH diet.   Reminder to go to the ER if any CP, SOB, nausea, dizziness, severe HA, changes vision/speech, left arm numbness and tingling and jaw pain. - CBC with Differential - CMP/GFR  Gastroesophageal reflux disease without esophagitis Continue PPI per GI, currently uses prn  Well managed on current medications Discussed diet, avoiding triggers and other lifestyle changes  ARTHRITIS, RHEUMATOID Continue medications and rheum follow up   Encounter for long-term (current) use of other medications - Magnesium  Hyperlipidemia - Lipid panel   Other abnormal glucose Discussed general issues about diabetes pathophysiology and management., Educational material distributed., Suggested low cholesterol diet., Encouraged aerobic exercise., Discussed foot care., Reminded to get yearly retinal exam. - Hemoglobin A1c - Insulin, fasting  Vitamin D Deficiency At goal at recent check; continue to recommend supplementation for goal of 70-100 Defer vitamin D level  Unspecified hypothyroidism - TSH - not doing well with levels, and symptoms will swtich from generic to brand name  Hyperparathyroidism ([place]) Monitor calcium  Osteoporosis Off fosamax, check DEXA, continue vitamin D  Personal history of colonic polyps Patient declines, would like to postpone for 1-2 years. Discussed risks which she expresses understanding. Will continue to discuss at CPE and AWV. Last colonoscopy was in 2014 with tubular adenoma, no high grade dysplasia.   Overweight - long discussion about weight loss, diet, and exercise  Insomnia - good sleep hygiene discussed, increase day time activity - discussed trying to avoid daily use of xanax   Future Appointments  Date Time  Provider [place]  11/22/2018  9:30 AM [name], MD [facility] None  05/31/2019 10:00 AM [name], MD [facility] None    Plan:   During the course of the visit the patient was educated and counseled about appropriate screening and preventive services including:    Pneumococcal vaccine   Influenza vaccine  Td vaccine  Screening electrocardiogram  Screening mammography  Bone densitometry screening  Colorectal cancer screening  Diabetes screening  Glaucoma screening  Nutrition counseling   Advanced directives: given info/requested  Subjective:   [name] is a 72 y.o. female who presents for Medicare Annual Wellness Visit and 3 month follow up.   She sees Dr. Trudie Reed regularly and states that her RA is well controlled on plaquenil and Methotrexate.   She is on fosamax for osteoporosis, stopped and restarted approx 1 year ago in fall 2018.   she has a diagnosis of insomnia and is currently on melatonin, benadryl and takes 0.5 mg of xanax (has failed multiple agents in the past including trazodone), reports symptoms are well controlled on current regimen. she reports she has been doing very well with this current plan.   she has a diagnosis of GERD with esophagitis and hx of PUD/gastric ulcer for which she continues management by prilosec 40 mg - was prescribed as BID but reports she only takes once daily and once again at night only if having problems.  she reports symptoms is currently well controlled, and denies breakthrough reflux, burning in chest, hoarseness or cough.    BMI is Body mass index is 29 kg/m., she has been working on diet- has increased fruit and vegetables considerably, eats very little meat, eats "sensibly."  Wt Readings from Last 3 Encounters:  08/15/18 156 lb (  70.8 kg)  05/09/18 158 lb 12.8 oz (72 kg)  02/15/18 162 lb (73.5 kg)   Her blood pressure has been controlled at home, today their BP is BP: 140/80. Has been  taking 25 mg losartan.   She does not workout.  She denies chest pain, shortness of breath, dizziness.   She is not on cholesterol medication and denies myalgias. Her cholesterol is at goal. The cholesterol last visit was:   Lab Results  Component Value Date   CHOL 166 05/09/2018   HDL 69 05/09/2018   LDLCALC 82 05/09/2018   TRIG 69 05/09/2018   CHOLHDL 2.4 05/09/2018   She has been working on diet and exercise for glucose management, and denies increased appetite, nausea, paresthesia of the feet, polydipsia and polyuria. Last A1C in the office was:  Lab Results  Component Value Date   HGBA1C 5.0 05/09/2018   Patient is on Vitamin D supplement. Lab Results  Component Value Date   VD25OH 41 05/09/2018   She is on thyroid medication. Her medication was changed last visit, to 174mcg, 1 pill daily.  Patient denies nervousness, palpitations and weight changes.  Lab Results  Component Value Date   TSH 0.17 (L) 05/09/2018     Medication Review Current Outpatient Medications on File Prior to Visit  Medication Sig Dispense Refill  . alendronate (FOSAMAX) 70 MG tablet TAKE 1 TABLET ONCE WEEKLY WITH A FULL GLASS OF WATER ON AN EMPTY STOMACH FOR 30 MINUTES. 12 tablet 3  . ALPRAZolam (XANAX) 1 MG tablet Take 1/2 to 1 tablet 1 to 2 x / day only if needed for Acute Anxiety Attack and please try to limit to 5 days /week to avoid addiction 120 tablet 0  . aspirin 81 MG tablet Take 81 mg by mouth daily.    Marland Kitchen b complex vitamins capsule Take 1 capsule by mouth daily.    Marland Kitchen BIOTIN PO Take 1 tablet by mouth daily.    . Cholecalciferol (VITAMIN D PO) Take 5,000 Units by mouth daily.     . folic acid (FOLVITE) 1 MG tablet Take 3 mg by mouth daily.     Marland Kitchen GLUCOSAMINE-CHONDROITIN PO Take 1 tablet by mouth daily.    . hydroxychloroquine (PLAQUENIL) 200 MG tablet Take by mouth daily. Take 1 tab AM and PM    . ibuprofen (ADVIL,MOTRIN) 800 MG tablet Take 1 tablet (800 mg total) by mouth every 8 (eight)  hours as needed for mild pain or moderate pain. PRN 270 tablet 2  . levothyroxine (SYNTHROID, LEVOTHROID) 112 MCG tablet TAKE 1 TABLET DAILY BEFORE BREAKFAST 90 tablet 3  . losartan (COZAAR) 50 MG tablet TAKE 1 TABLET DAILY FOR BLOOD PRESSURE 90 tablet 1  . Magnesium 500 MG TABS Take 1 tablet by mouth daily.    Marland Kitchen MELATONIN PO Take 1 tablet by mouth at bedtime.    . methotrexate (RHEUMATREX) 2.5 MG tablet Take 2.5 mg by mouth once a week. Patient injects 2.5 mg /ml 1 ml [place] weekly.    . naproxen (NAPROSYN) 500 MG tablet Take 1 tablet (500 mg total) by mouth 2 (two) times daily with a meal. (Patient taking differently: Take 500 mg by mouth as needed. ) 60 tablet 1  . omeprazole (PRILOSEC) 40 MG capsule TAKE 1 CAPSULE TWICE DAILY FOR ACID REFLUX 180 capsule 1  . OVER THE COUNTER MEDICATION Takes hair, skin and nails 1 time daily.    . Probiotic CAPS Take 1 capsule by mouth daily.  Current Facility-Administered Medications on File Prior to Visit  Medication Dose Route Frequency Provider Last Rate Last Dose  . dexamethasone (DECADRON) injection 10 mg  10 mg Other Once [name], MD      . dexamethasone (DECADRON) injection 10 mg  10 mg Other Once [name], MD        Current Problems (verified) Patient Active Problem List   Diagnosis Date Noted  . Neuropathy 08/15/2018  . Insomnia 02/06/2018  . Overweight (BMI 25.0-29.9) 09/17/2015  . Hyperparathyroidism ([place]) 07/25/2015  . Hypothyroidism 02/11/2014  . Osteoporosis 02/11/2014  . Vitamin D deficiency 01/22/2014  . Other abnormal glucose 01/22/2014  . Medication management 01/22/2014  . Personal history of colonic polyps 04/18/2013  . GERD (gastroesophageal reflux disease) 02/28/2013  . Hyperlipidemia, mixed 10/07/2011  . Essential hypertension 10/21/2008  . Rheumatoid arthritis ([place]) 10/21/2008    Screening Tests Immunization History  Administered Date(s) Administered  . Influenza, High Dose Seasonal PF 07/16/2014,  09/17/2015, 07/01/2016, 08/04/2017  . Pneumococcal Conjugate-13 10/16/2015  . Pneumococcal Polysaccharide-23 11/08/2012  . Td 11/08/2012  . Zoster 09/17/2015   Preventative care: Last colonoscopy: 03/2013, 5 year follow up, Dr. Deatra Ina, patient requests to postpone  Last mammogram: 05/2018 Last pap smear/pelvic exam: remote   DEXA: 07/2016 put back on fosamax after being off of it a year, since 07/2017  MRI head 2012 normal Echo 2012 normal Stress test 2016 US RENAL normal 2013  Prior vaccinations: TD or Tdap: 2014  Influenza: 2018 out in office Prevnar 13: 2016 Pneumococcal: 2014 Shingles/Zostavax: 2016  Names of Other Physician/Practitioners you currently use: 1. [facility]- here for primary care 2. Dr. Gershon Crane, eye doctor, last visit q12 months 2019, has scheduled 08/2018 3. Dr. Orvil Feil, dentist, last 2019  Patient Care Team: [name], MD as PCP - General (Internal Medicine) Stark Klein, MD as Consulting Physician (General Surgery) Gavin Pound, MD as Consulting Physician (Rheumatology) Lelon Perla, MD as Consulting Physician (Cardiology) Inda Castle, MD (Inactive) as Consulting Physician (Gastroenterology) Starr Lake, MD as Referring Physician (Orthopedic Surgery)   Allergies Allergies  Allergen Reactions  . Ace Inhibitors Cough  . Ambien [Zolpidem] Other (See Comments)    Odd Feeling  . Crestor [Rosuvastatin]   . Pravastatin   . Prozac [Fluoxetine Hcl] Other (See Comments)    Decreased libido  . Zoloft [Sertraline Hcl]     SURGICAL HISTORY She  has a past surgical history that includes Vaginal hysterectomy; Carpal tunnel release; Cystectomy; AV fistula repair; Bladder suspension; Pilonidal cyst excision; Trigger finger release; Bunionectomy; ERCP (09/29/2011); Tonsillectomy; Cholecystectomy (09/07/2011); Tenolysis (10/26/2011); Total shoulder replacement (Right, 02/22/2014); and Breast cyst  excision. FAMILY HISTORY Her family history includes Breast cancer in her paternal aunt; Colon cancer in her maternal grandfather; Heart attack in her brother and father; Lung cancer in her mother; Prostate cancer in her paternal grandfather; Stomach cancer in her father. SOCIAL HISTORY She  reports that she quit smoking about 22 years ago. She has never used smokeless tobacco. She reports that she drinks alcohol. She reports that she does not use drugs.  MEDICARE WELLNESS OBJECTIVES: Physical activity:   Cardiac risk factors:   Depression/mood screen:   Depression screen [facility] 2/9 08/15/2018  Decreased Interest 0  Down, Depressed, Hopeless 0  PHQ - 2 Score 0    ADLs:  In your present state of health, do you have any difficulty performing the following activities: 05/07/2018 11/05/2017  Hearing? N N  Vision? N N  Difficulty  concentrating or making decisions? N N  Walking or climbing stairs? N N  Dressing or bathing? N N  Doing errands, shopping? N N  Some recent data might be hidden    Cognitive Testing  Alert? Yes  Normal Appearance?Yes  Oriented to person? Yes  Place? Yes   Time? Yes  Recall of three objects?  Yes  Can perform simple calculations? Yes  Displays appropriate judgment?Yes  Can read the correct time from a watch face?Yes  EOL planning: Does Patient Have a Medical Advance Directive?: No Would patient like information on creating a medical advance directive?: No - Patient declined  Objective:   Blood pressure 140/80, pulse 65, temperature 97.7 F (36.5 C), height 5' 1.5" (1.562 m), weight 156 lb (70.8 kg), SpO2 99 %. Body mass index is 29 kg/m.  General appearance: alert, no distress, WD/WN,  female HEENT: normocephalic, sclerae anicteric, TMs pearly, nares patent, no discharge or erythema, pharynx normal Oral cavity: MMM, no lesions Neck: supple, no lymphadenopathy, no thyromegaly, no masses Heart: RRR, normal S1, S2, no murmurs Lungs: CTA bilaterally, no  wheezes, rhonchi, or rales Abdomen: +bs, soft, nontender, non distended, no masses, no hepatomegaly, no splenomegaly Musculoskeletal: + costochondral tenderness to palpation, no swelling, no obvious deformity Extremities: no edema, no cyanosis, no clubbing Pulses: 2+ symmetric, upper and lower extremities, normal cap refill Neurological: alert, oriented x 3, CN2-12 intact, strength normal upper extremities and lower extremities, sensation normal throughout, DTRs 2+ throughout, no cerebellar signs, gait normal Psychiatric: normal affect, behavior normal, pleasant  Breast: defer Gyn: defer Rectal: defer  Medicare Attestation I have personally reviewed: The patient's medical and social history Their use of alcohol, tobacco or illicit drugs Their current medications and supplements The patient's functional ability including ADLs,fall risks, home safety risks, cognitive, and hearing and visual impairment Diet and physical activities Evidence for depression or mood disorders  The patient's weight, height, BMI, and visual acuity have been recorded in the chart.  I have made referrals, counseling, and provided education to the patient based on review of the above and I have provided the patient with a written personalized care plan for preventive services.     Izora Ribas, NP   08/15/2018

## 2018-08-15 ENCOUNTER — Encounter: Payer: Self-pay | Admitting: Adult Health

## 2018-08-15 ENCOUNTER — Ambulatory Visit: Payer: Medicare PPO | Admitting: Adult Health

## 2018-08-15 VITALS — BP 140/80 | HR 65 | Temp 97.7°F | Ht 61.5 in | Wt 156.0 lb

## 2018-08-15 DIAGNOSIS — K219 Gastro-esophageal reflux disease without esophagitis: Secondary | ICD-10-CM

## 2018-08-15 DIAGNOSIS — M810 Age-related osteoporosis without current pathological fracture: Secondary | ICD-10-CM

## 2018-08-15 DIAGNOSIS — Z79899 Other long term (current) drug therapy: Secondary | ICD-10-CM

## 2018-08-15 DIAGNOSIS — E782 Mixed hyperlipidemia: Secondary | ICD-10-CM

## 2018-08-15 DIAGNOSIS — E663 Overweight: Secondary | ICD-10-CM

## 2018-08-15 DIAGNOSIS — Z0001 Encounter for general adult medical examination with abnormal findings: Secondary | ICD-10-CM | POA: Diagnosis not present

## 2018-08-15 DIAGNOSIS — G629 Polyneuropathy, unspecified: Secondary | ICD-10-CM | POA: Insufficient documentation

## 2018-08-15 DIAGNOSIS — I10 Essential (primary) hypertension: Secondary | ICD-10-CM

## 2018-08-15 DIAGNOSIS — R6889 Other general symptoms and signs: Secondary | ICD-10-CM

## 2018-08-15 DIAGNOSIS — R7309 Other abnormal glucose: Secondary | ICD-10-CM

## 2018-08-15 DIAGNOSIS — M069 Rheumatoid arthritis, unspecified: Secondary | ICD-10-CM

## 2018-08-15 DIAGNOSIS — E213 Hyperparathyroidism, unspecified: Secondary | ICD-10-CM | POA: Diagnosis not present

## 2018-08-15 DIAGNOSIS — E559 Vitamin D deficiency, unspecified: Secondary | ICD-10-CM

## 2018-08-15 DIAGNOSIS — Z86010 Personal history of colonic polyps: Secondary | ICD-10-CM

## 2018-08-15 DIAGNOSIS — E039 Hypothyroidism, unspecified: Secondary | ICD-10-CM

## 2018-08-15 DIAGNOSIS — G4700 Insomnia, unspecified: Secondary | ICD-10-CM

## 2018-08-15 DIAGNOSIS — Z0000 Encounter for general adult medical examination without abnormal findings: Secondary | ICD-10-CM

## 2018-08-15 LAB — CBC WITH DIFFERENTIAL/PLATELET
BASOS ABS: 59 {cells}/uL (ref 0–200)
Basophils Relative: 1.3 %
EOS PCT: 6.4 %
Eosinophils Absolute: 288 cells/uL (ref 15–500)
HEMATOCRIT: 36 % (ref 35.0–45.0)
Hemoglobin: 12.1 g/dL (ref 11.7–15.5)
LYMPHS ABS: 1521 {cells}/uL (ref 850–3900)
MCH: 29.9 pg (ref 27.0–33.0)
MCHC: 33.6 g/dL (ref 32.0–36.0)
MCV: 88.9 fL (ref 80.0–100.0)
MPV: 13.7 fL — ABNORMAL HIGH (ref 7.5–12.5)
Monocytes Relative: 12.4 %
NEUTROS ABS: 2075 {cells}/uL (ref 1500–7800)
NEUTROS PCT: 46.1 %
Platelets: 152 10*3/uL (ref 140–400)
RBC: 4.05 10*6/uL (ref 3.80–5.10)
RDW: 14.6 % (ref 11.0–15.0)
Total Lymphocyte: 33.8 %
WBC: 4.5 10*3/uL (ref 3.8–10.8)
WBCMIX: 558 {cells}/uL (ref 200–950)

## 2018-08-15 LAB — LIPID PANEL
CHOL/HDL RATIO: 2.7 (calc) (ref <5.0)
Cholesterol: 175 mg/dL (ref <200)
HDL: 65 mg/dL (ref >50)
LDL CHOLESTEROL (CALC): 93 mg/dL
Non-HDL Cholesterol (Calc): 110 mg/dL (calc) (ref <130)
Triglycerides: 84 mg/dL (ref <150)

## 2018-08-15 LAB — COMPLETE METABOLIC PANEL WITH GFR
AG Ratio: 2.3 (calc) (ref 1.0–2.5)
ALT: 23 U/L (ref 6–29)
AST: 35 U/L (ref 10–35)
Albumin: 4.2 g/dL (ref 3.6–5.1)
Alkaline phosphatase (APISO): 98 U/L (ref 33–130)
BUN / CREAT RATIO: 9 (calc) (ref 6–22)
BUN: 9 mg/dL (ref 7–25)
CALCIUM: 10.9 mg/dL — AB (ref 8.6–10.4)
CO2: 32 mmol/L (ref 20–32)
CREATININE: 1.01 mg/dL — AB (ref 0.60–0.93)
Chloride: 103 mmol/L (ref 98–110)
GFR, EST NON AFRICAN AMERICAN: 56 mL/min/{1.73_m2} — AB (ref >=60)
GFR, Est African American: 65 mL/min/{1.73_m2} (ref >=60)
GLOBULIN: 1.8 g/dL — AB (ref 1.9–3.7)
Glucose, Bld: 86 mg/dL (ref 65–99)
POTASSIUM: 4.3 mmol/L (ref 3.5–5.3)
SODIUM: 139 mmol/L (ref 135–146)
Total Bilirubin: 0.8 mg/dL (ref 0.2–1.2)
Total Protein: 6 g/dL — ABNORMAL LOW (ref 6.1–8.1)

## 2018-08-15 LAB — TSH: TSH: 2.38 mIU/L (ref 0.40–4.50)

## 2018-08-15 LAB — MAGNESIUM: Magnesium: 2.1 mg/dL (ref 1.5–2.5)

## 2018-08-15 MED ORDER — GABAPENTIN 100 MG PO CAPS: 100.0000 mg | ORAL_CAPSULE | Freq: Three times a day (TID) | ORAL | 0 refills | Status: DC | PRN

## 2018-08-15 NOTE — Patient Instructions (Addendum)
  [name] , Thank you for taking time to come for your Medicare Wellness Visit. I appreciate your ongoing commitment to your health goals. Please review the following plan we discussed and let me know if I can assist you in the future.   These are the goals we discussed: Goals    . Blood Pressure < 130/80    . Exercise 150 min/wk Moderate Activity     Start with 15 min a day of brisk walking. Ideally add weights or resistance exercise 2-3 times per week.     . Weight (lb) < 150 lb (68 kg)       This is a list of the screening recommended for you and due dates:  Health Maintenance  Topic Date Due  . Flu Shot  08/22/2018*  . Colon Cancer Screening  12/16/2018*  .  Hepatitis C: One time screening is recommended by Center for Disease Control  (CDC) for  adults born from [birth year] through 1964.   08/16/2019*  . Mammogram  05/24/2020  . Tetanus Vaccine  11/08/2022  . DEXA scan (bone density measurement)  Completed  . Pneumonia vaccines  Completed  *Topic was postponed. The date shown is not the original due date.    Increase losartan back up to 50 mg daily, check blood pressure, call if consistently running above goal.       Know what a healthy weight is for you (roughly BMI <25) and aim to maintain this  Aim for 7+ servings of fruits and vegetables daily  65-80+ fluid ounces of water or unsweet tea for healthy kidneys  Limit to max 1 drink of alcohol per day; avoid smoking/tobacco  Limit animal fats in diet for cholesterol and heart health - choose grass fed whenever available  Avoid highly processed foods, and foods high in saturated/trans fats  Aim for low stress - take time to unwind and care for your mental health  Aim for 150 min of moderate intensity exercise weekly for heart health, and weights twice weekly for bone health  Aim for 7-9 hours of sleep daily

## 2018-09-12 DIAGNOSIS — M150 Primary generalized (osteo)arthritis: Secondary | ICD-10-CM | POA: Diagnosis not present

## 2018-09-12 DIAGNOSIS — M0609 Rheumatoid arthritis without rheumatoid factor, multiple sites: Secondary | ICD-10-CM | POA: Diagnosis not present

## 2018-09-12 DIAGNOSIS — E663 Overweight: Secondary | ICD-10-CM | POA: Diagnosis not present

## 2018-09-12 DIAGNOSIS — M2550 Pain in unspecified joint: Secondary | ICD-10-CM | POA: Diagnosis not present

## 2018-09-12 DIAGNOSIS — Z79899 Other long term (current) drug therapy: Secondary | ICD-10-CM | POA: Diagnosis not present

## 2018-09-12 DIAGNOSIS — Z6828 Body mass index (BMI) 28.0-28.9, adult: Secondary | ICD-10-CM | POA: Diagnosis not present

## 2018-09-21 DIAGNOSIS — Z79899 Other long term (current) drug therapy: Secondary | ICD-10-CM | POA: Diagnosis not present

## 2018-11-22 ENCOUNTER — Ambulatory Visit: Payer: Medicare PPO | Admitting: Internal Medicine

## 2018-11-22 ENCOUNTER — Encounter: Payer: Self-pay | Admitting: Internal Medicine

## 2018-11-22 VITALS — BP 136/76 | HR 64 | Temp 97.5°F | Resp 16 | Ht 61.5 in | Wt 157.0 lb

## 2018-11-22 DIAGNOSIS — K219 Gastro-esophageal reflux disease without esophagitis: Secondary | ICD-10-CM | POA: Diagnosis not present

## 2018-11-22 DIAGNOSIS — R7303 Prediabetes: Secondary | ICD-10-CM

## 2018-11-22 DIAGNOSIS — M069 Rheumatoid arthritis, unspecified: Secondary | ICD-10-CM

## 2018-11-22 DIAGNOSIS — Z79899 Other long term (current) drug therapy: Secondary | ICD-10-CM

## 2018-11-22 DIAGNOSIS — E039 Hypothyroidism, unspecified: Secondary | ICD-10-CM | POA: Diagnosis not present

## 2018-11-22 DIAGNOSIS — I10 Essential (primary) hypertension: Secondary | ICD-10-CM

## 2018-11-22 DIAGNOSIS — R7309 Other abnormal glucose: Secondary | ICD-10-CM

## 2018-11-22 DIAGNOSIS — E782 Mixed hyperlipidemia: Secondary | ICD-10-CM

## 2018-11-22 DIAGNOSIS — E559 Vitamin D deficiency, unspecified: Secondary | ICD-10-CM

## 2018-11-22 DIAGNOSIS — G479 Sleep disorder, unspecified: Secondary | ICD-10-CM

## 2018-11-22 NOTE — Progress Notes (Signed)
This very nice 73 y.o. MWRF presents for 6 month follow up with HTN, HLD, Pre-Diabetes and Vitamin D Deficiency.  Patient has Rheumatoid Arthritis (1985)  & is followed by Dr Berna Bue on MTX, Plaquenil and most recently on Enbrel. She is on Fosamax for Osteoporosis.      Patient endorses poor sleep, snores, restless awakens fatigued and has excessive daytime fatigue & sleepiness. She is uncertain if she has apneic episodes.     Patient is treated for HTN (1997) & BP has been controlled at home. Today's BP was initially elevated & rechecked at goal - 136/76. Patient has had no complaints of any cardiac type chest pain, palpitations, dyspnea / orthopnea / PND, dizziness, claudication, or dependent edema.     Hyperlipidemia is controlled with diet & meds. Patient denies myalgias or other med SE's. Last Lipids were at goal: Lab Results  Component Value Date   CHOL 179 11/22/2018   HDL 69 11/22/2018   LDLCALC 96 11/22/2018   TRIG 62 11/22/2018   CHOLHDL 2.6 11/22/2018      Also, the patient is overweight (BMI 29+) and has history of PreDiabetes (A1c 5.8% / 2011) and has had no symptoms of reactive hypoglycemia, diabetic polys, paresthesias or visual blurring.  Last A1c was Normal & at goal: Lab Results  Component Value Date   HGBA1C 5.1 11/22/2018      Patient was initiated on Thyroid Replacement in the 1980's.     Further, the patient also has history of Vitamin D Deficiency ("38" / 2008) and supplements vitamin D without any suspected side-effects. Last vitamin D was at goal: Lab Results  Component Value Date   VD25OH 74 11/22/2018   Current Outpatient Medications on File Prior to Visit  Medication Sig  . alendronate (FOSAMAX) 70 MG tablet TAKE 1 TABLET ONCE WEEKLY WITH A FULL GLASS OF WATER ON AN EMPTY STOMACH FOR 30 MINUTES.  Marland Kitchen ALPRAZolam (XANAX) 1 MG tablet Take 1/2 to 1 tablet 1 to 2 x / day only if needed for Acute Anxiety Attack and please try to limit to 5 days /week to  avoid addiction  . aspirin 81 MG tablet Take 81 mg by mouth daily.  Marland Kitchen b complex vitamins capsule Take 1 capsule by mouth daily.  Marland Kitchen BIOTIN PO Take 1 tablet by mouth daily.  . Cholecalciferol (VITAMIN D PO) Take 5,000 Units by mouth daily.   . folic acid (FOLVITE) 1 MG tablet Take 3 mg by mouth daily.   Marland Kitchen gabapentin (NEURONTIN) 100 MG capsule Take 1 capsule (100 mg total) by mouth 3 (three) times daily as needed.  Marland Kitchen GLUCOSAMINE-CHONDROITIN PO Take 1 tablet by mouth daily.  . hydroxychloroquine (PLAQUENIL) 200 MG tablet Take by mouth daily. Take 1 tab AM and PM  . ibuprofen (ADVIL,MOTRIN) 800 MG tablet Take 1 tablet (800 mg total) by mouth every 8 (eight) hours as needed for mild pain or moderate pain. PRN  . Magnesium 500 MG TABS Take 1 tablet by mouth daily.  Marland Kitchen MELATONIN PO Take 1 tablet by mouth at bedtime.  . methotrexate (RHEUMATREX) 2.5 MG tablet Take 2.5 mg by mouth once a week. Patient injects 2.5 mg /ml 1 ml [place] weekly.  . naproxen (NAPROSYN) 500 MG tablet Take 1 tablet (500 mg total) by mouth 2 (two) times daily with a meal. (Patient taking differently: Take 500 mg by mouth as needed. )  . OVER THE COUNTER MEDICATION Takes hair, skin and nails 1  time daily.  . Probiotic CAPS Take 1 capsule by mouth daily.   No current facility-administered medications on file prior to visit.    Allergies  Allergen Reactions  . Ace Inhibitors Cough  . Ambien [Zolpidem] Other (See Comments)    Odd Feeling  . Crestor [Rosuvastatin]   . Pravastatin   . Prozac [Fluoxetine Hcl] Other (See Comments)    Decreased libido  . Zoloft [Sertraline Hcl]    PMHx:   Past Medical History:  Diagnosis Date  . Anemia    Hx of   . Arthritis   . Calculus of bile duct without mention of cholecystitis or obstruction   . Cataract   . GERD (gastroesophageal reflux disease)   . H. pylori infection    Hx of   . Heart murmur   . Hyperlipidemia   . Hypertension   . Hypothyroid   . PUD (peptic ulcer disease)   .  Rheumatoid arthritis(714.0)    Immunization History  Administered Date(s) Administered  . Influenza, High Dose Seasonal PF 07/16/2014, 09/17/2015, 07/01/2016, 08/04/2017  . Pneumococcal Conjugate-13 10/16/2015  . Pneumococcal Polysaccharide-23 11/08/2012  . Td 11/08/2012  . Zoster 09/17/2015   Past Surgical History:  Procedure Laterality Date  . AV FISTULA REPAIR    . BLADDER SUSPENSION    . BREAST CYST EXCISION    . BUNIONECTOMY    . CARPAL TUNNEL RELEASE    . CHOLECYSTECTOMY  09/07/2011  . CYSTECTOMY     left breast  . ERCP  09/29/2011   Procedure: ENDOSCOPIC RETROGRADE CHOLANGIOPANCREATOGRAPHY (ERCP);  Surgeon: Inda Castle, MD;  Location: [facility];  Service: Endoscopy;  Laterality: N/A;  . PILONIDAL CYST EXCISION    . TENOLYSIS  10/26/2011   Procedure: TENDON SHEATH RELEASE/TENOLYSIS;  Surgeon: Wynonia Sours, MD;  Location: [facility];  Service: Orthopedics;  Laterality: Right;  tenosynovectomy removal superficialis slip right index finger  . TONSILLECTOMY    . TOTAL SHOULDER REPLACEMENT Right 02/22/2014   Dr. Enrigue Catena at [facility]  . TRIGGER FINGER RELEASE    . VAGINAL HYSTERECTOMY     ovaries not removed   FHx:    Reviewed / unchanged  SHx:    Reviewed / unchanged   Systems Review:  Constitutional: Denies fever, chills, wt changes, headaches, insomnia, fatigue, night sweats, change in appetite. Eyes: Denies redness, blurred vision, diplopia, discharge, itchy, watery eyes.  ENT: Denies discharge, congestion, post nasal drip, epistaxis, sore throat, earache, hearing loss, dental pain, tinnitus, vertigo, sinus pain, snoring.  CV: Denies chest pain, palpitations, irregular heartbeat, syncope, dyspnea, diaphoresis, orthopnea, PND, claudication or edema. Respiratory: denies cough, dyspnea, DOE, pleurisy, hoarseness, laryngitis, wheezing.  Gastrointestinal: Denies dysphagia, odynophagia, heartburn, reflux, water brash, abdominal pain or cramps, nausea,  vomiting, bloating, diarrhea, constipation, hematemesis, melena, hematochezia  or hemorrhoids. Genitourinary: Denies dysuria, frequency, urgency, nocturia, hesitancy, discharge, hematuria or flank pain. Musculoskeletal: Denies arthralgias, myalgias, stiffness, jt. swelling, pain, limping or strain/sprain.  Skin: Denies pruritus, rash, hives, warts, acne, eczema or change in skin lesion(s). Neuro: No weakness, tremor, incoordination, spasms, paresthesia or pain. Psychiatric: Denies confusion, memory loss or sensory loss. Endo: Denies change in weight, skin or hair change.  Heme/Lymph: No excessive bleeding, bruising or enlarged lymph nodes.  Physical Exam  BP 136/76   Pulse 64   Temp (!) 97.5 F (36.4 C)   Resp 16   Ht 5' 1.5" (1.562 m)   Wt 157 lb (71.2 kg)   BMI 29.18 kg/m   Appears  Over  nourished, well groomed  and in no distress.  Eyes: PERRLA, EOMs, conjunctiva no swelling or erythema. Sinuses: No frontal/maxillary tenderness ENT/Mouth: EAC's clear, TM's nl w/o erythema, bulging. Nares clear w/o erythema, swelling, exudates. Oropharynx - Mallampati II - clear without erythema or exudates. Oral hygiene is good. Tongue normal, non obstructing. Hearing intact.  Neck: Supple. Thyroid not palpable. Car 2+/2+ without bruits, nodes or JVD. Chest: Respirations nl with BS clear & equal w/o rales, rhonchi, wheezing or stridor.  Cor: Heart sounds normal w/ regular rate and rhythm without sig. murmurs, gallops, clicks or rubs. Peripheral pulses normal and equal  without edema.  Abdomen: Soft & bowel sounds normal. Non-tender w/o guarding, rebound, hernias, masses or organomegaly.  Lymphatics: Unremarkable.  Musculoskeletal: Full ROM all peripheral extremities, joint stability, 5/5 strength and normal gait.  Skin: Warm, dry without exposed rashes, lesions or ecchymosis apparent.  Neuro: Cranial nerves intact, reflexes equal bilaterally. Sensory-motor testing grossly intact. Tendon reflexes  grossly intact.  Pysch: Alert & oriented x 3.  Insight and judgement nl & appropriate. No ideations.  Assessment and Plan:  1. Essential hypertension  - Continue medication, monitor blood pressure at home.  - Continue DASH diet.  Reminder to go to the ER if any CP,  SOB, nausea, dizziness, severe HA, changes vision/speech.  - CBC with Differential/Platelet - COMPLETE METABOLIC PANEL WITH GFR - Magnesium - TSH  2. Hyperlipidemia, mixed  - Continue diet/meds, exercise,& lifestyle modifications.  - Continue monitor periodic cholesterol/liver & renal functions   - Lipid panel - TSH  3. Abnormal glucose  - Continue diet, exercise  - Lifestyle modifications.  - Monitor appropriate labs.  - Hemoglobin A1c - Insulin, random  4. Vitamin D deficiency  - Continue supplementation.  - VITAMIN D 25 Hydroxyl  5. Hypothyroidism, unspecified type  - TSH  6. Gastroesophageal reflux disease  - CBC with Differential/Platelet - Lipid panel  7. Prediabetes  - Hemoglobin A1c - Insulin, random  8. Rheumatoid arthritis involving multiple sites ([place])  9. Medication management  - CBC with Differential/Platelet - COMPLETE METABOLIC PANEL WITH GFR - Magnesium - Lipid panel - TSH - Hemoglobin A1c - Insulin, random - VITAMIN D 25 Hydroxyl  10. Sleep disorder  - Ambulatory referral to Sleep Studies - patient declined after initially ordered       Discussed  regular exercise, BP monitoring, weight control to achieve/maintain BMI less than 25 and discussed med and SE's. Recommended labs to assess and monitor clinical status with further disposition pending results of labs. Over 30 minutes of exam, counseling, chart review was performed.

## 2018-11-22 NOTE — Patient Instructions (Signed)

## 2018-11-23 LAB — COMPLETE METABOLIC PANEL WITH GFR
AG RATIO: 2.4 (calc) (ref 1.0–2.5)
ALT: 15 U/L (ref 6–29)
AST: 25 U/L (ref 10–35)
Albumin: 4.3 g/dL (ref 3.6–5.1)
Alkaline phosphatase (APISO): 104 U/L (ref 33–130)
BILIRUBIN TOTAL: 0.7 mg/dL (ref 0.2–1.2)
BUN/Creatinine Ratio: 6 (calc) (ref 6–22)
BUN: 6 mg/dL — ABNORMAL LOW (ref 7–25)
CHLORIDE: 105 mmol/L (ref 98–110)
CO2: 29 mmol/L (ref 20–32)
Calcium: 10.7 mg/dL — ABNORMAL HIGH (ref 8.6–10.4)
Creat: 1.04 mg/dL — ABNORMAL HIGH (ref 0.60–0.93)
GFR, EST AFRICAN AMERICAN: 63 mL/min/{1.73_m2} (ref >=60)
GFR, Est Non African American: 54 mL/min/{1.73_m2} — ABNORMAL LOW (ref >=60)
Globulin: 1.8 g/dL (calc) — ABNORMAL LOW (ref 1.9–3.7)
Glucose, Bld: 81 mg/dL (ref 65–99)
POTASSIUM: 4.4 mmol/L (ref 3.5–5.3)
SODIUM: 141 mmol/L (ref 135–146)
TOTAL PROTEIN: 6.1 g/dL (ref 6.1–8.1)

## 2018-11-23 LAB — CBC WITH DIFFERENTIAL/PLATELET
Absolute Monocytes: 543 cells/uL (ref 200–950)
BASOS ABS: 51 {cells}/uL (ref 0–200)
Basophils Relative: 1.1 %
EOS ABS: 354 {cells}/uL (ref 15–500)
EOS PCT: 7.7 %
HEMATOCRIT: 37.6 % (ref 35.0–45.0)
HEMOGLOBIN: 12.5 g/dL (ref 11.7–15.5)
LYMPHS ABS: 1564 {cells}/uL (ref 850–3900)
MCH: 29.8 pg (ref 27.0–33.0)
MCHC: 33.2 g/dL (ref 32.0–36.0)
MCV: 89.5 fL (ref 80.0–100.0)
MPV: 13.9 fL — ABNORMAL HIGH (ref 7.5–12.5)
Monocytes Relative: 11.8 %
NEUTROS ABS: 2088 {cells}/uL (ref 1500–7800)
NEUTROS PCT: 45.4 %
Platelets: 181 10*3/uL (ref 140–400)
RBC: 4.2 10*6/uL (ref 3.80–5.10)
RDW: 14.3 % (ref 11.0–15.0)
Total Lymphocyte: 34 %
WBC: 4.6 10*3/uL (ref 3.8–10.8)

## 2018-11-23 LAB — LIPID PANEL
Cholesterol: 179 mg/dL (ref <200)
HDL: 69 mg/dL (ref >50)
LDL Cholesterol (Calc): 96 mg/dL (calc)
NON-HDL CHOLESTEROL (CALC): 110 mg/dL (ref <130)
Total CHOL/HDL Ratio: 2.6 (calc) (ref <5.0)
Triglycerides: 62 mg/dL (ref <150)

## 2018-11-23 LAB — MAGNESIUM: Magnesium: 2 mg/dL (ref 1.5–2.5)

## 2018-11-23 LAB — HEMOGLOBIN A1C
EAG (MMOL/L): 5.5 (calc)
Hgb A1c MFr Bld: 5.1 % of total Hgb (ref <5.7)
MEAN PLASMA GLUCOSE: 100 (calc)

## 2018-11-23 LAB — TSH: TSH: 1.48 mIU/L (ref 0.40–4.50)

## 2018-11-23 LAB — VITAMIN D 25 HYDROXY (VIT D DEFICIENCY, FRACTURES): Vit D, 25-Hydroxy: 80 ng/mL (ref 30–100)

## 2018-11-23 LAB — INSULIN, RANDOM: INSULIN: 2.7 u[IU]/mL (ref 2.0–19.6)

## 2018-11-25 ENCOUNTER — Other Ambulatory Visit: Payer: Self-pay | Admitting: Physician Assistant

## 2018-11-25 ENCOUNTER — Other Ambulatory Visit: Payer: Self-pay | Admitting: Internal Medicine

## 2018-11-26 ENCOUNTER — Encounter: Payer: Self-pay | Admitting: Internal Medicine

## 2018-12-01 ENCOUNTER — Other Ambulatory Visit: Payer: Self-pay | Admitting: Internal Medicine

## 2018-12-01 DIAGNOSIS — E039 Hypothyroidism, unspecified: Secondary | ICD-10-CM

## 2018-12-01 MED ORDER — LEVOTHYROXINE SODIUM 112 MCG PO TABS: ORAL_TABLET | ORAL | 3 refills | Status: DC

## 2018-12-04 ENCOUNTER — Other Ambulatory Visit: Payer: Self-pay | Admitting: Internal Medicine

## 2018-12-04 DIAGNOSIS — E039 Hypothyroidism, unspecified: Secondary | ICD-10-CM

## 2018-12-04 MED ORDER — LEVOTHYROXINE SODIUM 112 MCG PO TABS: ORAL_TABLET | ORAL | 3 refills | Status: DC

## 2018-12-13 DIAGNOSIS — M0609 Rheumatoid arthritis without rheumatoid factor, multiple sites: Secondary | ICD-10-CM | POA: Diagnosis not present

## 2018-12-13 DIAGNOSIS — Z6828 Body mass index (BMI) 28.0-28.9, adult: Secondary | ICD-10-CM | POA: Diagnosis not present

## 2018-12-13 DIAGNOSIS — E663 Overweight: Secondary | ICD-10-CM | POA: Diagnosis not present

## 2018-12-13 DIAGNOSIS — M2550 Pain in unspecified joint: Secondary | ICD-10-CM | POA: Diagnosis not present

## 2018-12-13 DIAGNOSIS — Z79899 Other long term (current) drug therapy: Secondary | ICD-10-CM | POA: Diagnosis not present

## 2018-12-13 DIAGNOSIS — M150 Primary generalized (osteo)arthritis: Secondary | ICD-10-CM | POA: Diagnosis not present

## 2018-12-13 DIAGNOSIS — M810 Age-related osteoporosis without current pathological fracture: Secondary | ICD-10-CM | POA: Diagnosis not present

## 2019-01-04 DIAGNOSIS — D696 Thrombocytopenia, unspecified: Secondary | ICD-10-CM | POA: Diagnosis not present

## 2019-02-21 ENCOUNTER — Other Ambulatory Visit: Payer: Medicare PPO

## 2019-02-21 ENCOUNTER — Other Ambulatory Visit: Payer: Self-pay

## 2019-02-21 DIAGNOSIS — I10 Essential (primary) hypertension: Secondary | ICD-10-CM | POA: Diagnosis not present

## 2019-02-21 DIAGNOSIS — E782 Mixed hyperlipidemia: Secondary | ICD-10-CM

## 2019-02-21 DIAGNOSIS — R7309 Other abnormal glucose: Secondary | ICD-10-CM

## 2019-02-21 DIAGNOSIS — E559 Vitamin D deficiency, unspecified: Secondary | ICD-10-CM

## 2019-02-21 DIAGNOSIS — E213 Hyperparathyroidism, unspecified: Secondary | ICD-10-CM

## 2019-02-21 DIAGNOSIS — E039 Hypothyroidism, unspecified: Secondary | ICD-10-CM | POA: Diagnosis not present

## 2019-02-21 DIAGNOSIS — Z79899 Other long term (current) drug therapy: Secondary | ICD-10-CM | POA: Diagnosis not present

## 2019-02-22 LAB — COMPLETE METABOLIC PANEL WITH GFR
AG Ratio: 2.2 (calc) (ref 1.0–2.5)
ALT: 19 U/L (ref 6–29)
AST: 31 U/L (ref 10–35)
Albumin: 4.1 g/dL (ref 3.6–5.1)
Alkaline phosphatase (APISO): 77 U/L (ref 37–153)
BUN/Creatinine Ratio: 6 (calc) (ref 6–22)
BUN: 6 mg/dL — ABNORMAL LOW (ref 7–25)
CO2: 31 mmol/L (ref 20–32)
Calcium: 10.4 mg/dL (ref 8.6–10.4)
Chloride: 103 mmol/L (ref 98–110)
Creat: 0.94 mg/dL — ABNORMAL HIGH (ref 0.60–0.93)
GFR, Est African American: 71 mL/min/{1.73_m2} (ref >=60)
GFR, Est Non African American: 61 mL/min/{1.73_m2} (ref >=60)
Globulin: 1.9 g/dL (calc) (ref 1.9–3.7)
Glucose, Bld: 97 mg/dL (ref 65–99)
Potassium: 4.1 mmol/L (ref 3.5–5.3)
Sodium: 140 mmol/L (ref 135–146)
Total Bilirubin: 0.6 mg/dL (ref 0.2–1.2)
Total Protein: 6 g/dL — ABNORMAL LOW (ref 6.1–8.1)

## 2019-02-22 LAB — CBC WITH DIFFERENTIAL/PLATELET
Absolute Monocytes: 502 cells/uL (ref 200–950)
Basophils Absolute: 49 cells/uL (ref 0–200)
Basophils Relative: 0.9 %
Eosinophils Absolute: 421 cells/uL (ref 15–500)
Eosinophils Relative: 7.8 %
HCT: 37.3 % (ref 35.0–45.0)
Hemoglobin: 12.1 g/dL (ref 11.7–15.5)
Lymphs Abs: 1436 cells/uL (ref 850–3900)
MCH: 29.4 pg (ref 27.0–33.0)
MCHC: 32.4 g/dL (ref 32.0–36.0)
MCV: 90.5 fL (ref 80.0–100.0)
MPV: 13.1 fL — ABNORMAL HIGH (ref 7.5–12.5)
Monocytes Relative: 9.3 %
Neutro Abs: 2992 cells/uL (ref 1500–7800)
Neutrophils Relative %: 55.4 %
Platelets: 157 10*3/uL (ref 140–400)
RBC: 4.12 10*6/uL (ref 3.80–5.10)
RDW: 15.4 % — ABNORMAL HIGH (ref 11.0–15.0)
Total Lymphocyte: 26.6 %
WBC: 5.4 10*3/uL (ref 3.8–10.8)

## 2019-02-22 LAB — LIPID PANEL
Cholesterol: 199 mg/dL (ref <200)
HDL: 76 mg/dL (ref >=50)
LDL Cholesterol (Calc): 106 mg/dL (calc) — ABNORMAL HIGH
Non-HDL Cholesterol (Calc): 123 mg/dL (calc) (ref <130)
Total CHOL/HDL Ratio: 2.6 (calc) (ref <5.0)
Triglycerides: 79 mg/dL (ref <150)

## 2019-02-22 LAB — TSH: TSH: 1.16 mIU/L (ref 0.40–4.50)

## 2019-02-22 LAB — HEMOGLOBIN A1C
Hgb A1c MFr Bld: 5.3 % of total Hgb (ref <5.7)
Mean Plasma Glucose: 105 (calc)
eAG (mmol/L): 5.8 (calc)

## 2019-02-22 LAB — VITAMIN D 25 HYDROXY (VIT D DEFICIENCY, FRACTURES): Vit D, 25-Hydroxy: 77 ng/mL (ref 30–100)

## 2019-02-22 LAB — MAGNESIUM: Magnesium: 1.9 mg/dL (ref 1.5–2.5)

## 2019-02-22 NOTE — Progress Notes (Signed)
MEDICARE ANNUAL WELLNESS VISIT AND FOLLOW UP  THIS ENCOUNTER IS A VIRTUAL/TELEPHONE VISIT DUE TO COVID-19 - PATIENT WAS NOT SEEN IN THE OFFICE.  PATIENT HAS CONSENTED TO VIRTUAL VISIT / TELEMEDICINE VISIT  This provider placed a call to Armond Hang using telephone, her appointment was changed to a virtual office visit to reduce the risk of exposure to the COVID-19 virus and to help [name] remain healthy and safe. The virtual visit will also provide continuity of care. She verbalizes understanding.   LAB DONE 4/15 DISCUSSED WITH PATIENT ON THE PHONE Assessment:   Encounter for annual medicare wellness visit 1 YEAR  HYPERTENSION Monitor blood pressure at home; call if consistently over 130/80 Continue DASH diet.   Reminder to go to the ER if any CP, SOB, nausea, dizziness, severe HA, changes vision/speech, left arm numbness and tingling and jaw pain.  Gastroesophageal reflux disease without esophagitis Continue PPI per GI, currently uses prn  Well managed on current medications Discussed diet, avoiding triggers and other lifestyle changes  ARTHRITIS, RHEUMATOID Continue medications and rheum follow up  Hyperlipidemia decrease fatty foods increase activity.    Other abnormal glucose Discussed general issues about diabetes pathophysiology and management., Educational material distributed., Suggested low cholesterol diet., Encouraged aerobic exercise., Discussed foot care., Reminded to get yearly retinal exam.  Vitamin D Deficiency At goal at recent check; continue to recommend supplementation for goal of 70-100 Defer vitamin D level  Unspecified hypothyroidism Hypothyroidism-check TSH level, continue medications the same, reminded to take on an empty stomach 30-18mins before food.   Hyperparathyroidism ([place]) Monitor calcium  Osteoporosis on fosamax x 2018, get DEXA, continue vitamin D  Personal history of colonic polyps Patient declines, would like to  postpone for 1-2 years. Discussed risks which she expresses understanding. Will continue to discuss at CPE and AWV. Last colonoscopy was in 2014 with tubular adenoma, no high grade dysplasia.   Overweight - long discussion about weight loss, diet, and exercise  Insomnia - good sleep hygiene discussed, increase day time activity - discussed trying to avoid daily use of xanax   Future Appointments  Date Time Provider [place]  05/31/2019 10:00 AM [name], MD [facility] None  08/27/2019  9:00 AM Liane Comber, NP [facility] None    Plan:   During the course of the visit the patient was educated and counseled about appropriate screening and preventive services including:    Pneumococcal vaccine   Influenza vaccine  Td vaccine  Screening electrocardiogram  Screening mammography  Bone densitometry screening  Colorectal cancer screening  Diabetes screening  Glaucoma screening  Nutrition counseling   Advanced directives: given info/requested  Subjective:   [name] is a 73 y.o. female who presents for Medicare Annual Wellness Visit and 3 month follow up.   Got a new puppy, will walk with the puppy, buddy, 3 months old.  She sees Dr. Trudie Reed regularly and states that her RA is well controlled on plaquenil and Methotrexate, she is nervous to not be able to get her plaquenil.   She is on fosamax for osteoporosis, stopped and restarted approx 1 year ago in fall 2018.   she has a diagnosis of insomnia and is currently on melatonin 20mg , advil pm and takes 0.5 mg of xanax (has failed multiple agents in the past including trazodone), reports symptoms are well controlled on current regimen. she reports she has been doing very well with this current plan. Occ will get up at 3 to urinate and  will sometimes sleep again or sometime not.   she has a diagnosis of GERD with esophagitis and hx of PUD/gastric ulcer for which she continues management by prilosec  40 mg - was prescribed as BID but reports she only takes once daily and once again at night only if having problems.  she reports symptoms is currently well controlled, and denies breakthrough reflux, burning in chest, hoarseness or cough.    BMI is Body mass index is 29.74 kg/m., she has been working on diet- has increased fruit and vegetables considerably, eats very little meat, eats "sensibly."  Wt Readings from Last 3 Encounters:  02/26/19 157 lb 6.4 oz (71.4 kg)  11/22/18 157 lb (71.2 kg)  08/15/18 156 lb (70.8 kg)   Her blood pressure is checked at home. Has been taking 50 mg losartan.  141/72 BP Readings from Last 3 Encounters:  02/26/19 (!) 141/72  11/22/18 136/76  08/15/18 140/80   She does not workout.  She denies chest pain, shortness of breath, dizziness.   She is not on cholesterol medication and denies myalgias. Her cholesterol is at goal. The cholesterol last visit was:   Lab Results  Component Value Date   CHOL 199 02/21/2019   HDL 76 02/21/2019   LDLCALC 106 (H) 02/21/2019   TRIG 79 02/21/2019   CHOLHDL 2.6 02/21/2019   She has been working on diet and exercise for glucose management, and denies increased appetite, nausea, paresthesia of the feet, polydipsia and polyuria. Last A1C in the office was:  Lab Results  Component Value Date   HGBA1C 5.3 02/21/2019   Patient is on Vitamin D supplement. Lab Results  Component Value Date   VD25OH 77 02/21/2019   She is on thyroid medication. Her medication was changed last visit, to 132mcg, 1 pill daily.  Patient denies nervousness, palpitations and weight changes.  Lab Results  Component Value Date   TSH 1.16 02/21/2019     Medication Review Current Outpatient Medications on File Prior to Visit  Medication Sig Dispense Refill  . alendronate (FOSAMAX) 70 MG tablet TAKE 1 TABLET ONCE WEEKLY WITH A FULL GLASS OF WATER ON AN EMPTY STOMACH FOR 30 MINUTES. 12 tablet 3  . ALPRAZolam (XANAX) 1 MG tablet Take 1/2 to 1  tablet 1 to 2 x / day only if needed for Acute Anxiety Attack and please try to limit to 5 days /week to avoid addiction 120 tablet 0  . aspirin 81 MG tablet Take 81 mg by mouth daily.    Marland Kitchen b complex vitamins capsule Take 1 capsule by mouth daily.    Marland Kitchen BIOTIN PO Take 1 tablet by mouth daily.    . Cholecalciferol (VITAMIN D PO) Take 5,000 Units by mouth daily.     . folic acid (FOLVITE) 1 MG tablet Take 3 mg by mouth daily.     Marland Kitchen GLUCOSAMINE-CHONDROITIN PO Take 1 tablet by mouth daily.    . hydroxychloroquine (PLAQUENIL) 200 MG tablet Take by mouth daily. Take 1 tab AM and PM    . ibuprofen (ADVIL,MOTRIN) 800 MG tablet Take 1 tablet (800 mg total) by mouth every 8 (eight) hours as needed for mild pain or moderate pain. PRN 270 tablet 2  . levothyroxine (SYNTHROID, LEVOTHROID) 112 MCG tablet Take 1 tablet daily on an empty stomach with only water for 30 minutes & No Antacid medications for 4 hours 90 tablet 3  . losartan (COZAAR) 50 MG tablet TAKE 1 TABLET EVERY DAY FOR BLOOD PRESSURE 90  tablet 3  . Magnesium 500 MG TABS Take 1 tablet by mouth daily.    Marland Kitchen MELATONIN PO Take 1 tablet by mouth at bedtime.    . methotrexate (RHEUMATREX) 2.5 MG tablet Take 2.5 mg by mouth once a week. Patient injects 2.5 mg /ml 1 ml [place] weekly.    . naproxen (NAPROSYN) 500 MG tablet Take 1 tablet (500 mg total) by mouth 2 (two) times daily with a meal. (Patient taking differently: Take 500 mg by mouth as needed. ) 60 tablet 1  . omeprazole (PRILOSEC) 40 MG capsule TAKE 1 CAPSULE TWICE DAILY FOR ACID REFLUX 180 capsule 3  . OVER THE COUNTER MEDICATION Takes hair, skin and nails 1 time daily.    . Probiotic CAPS Take 1 capsule by mouth daily.     No current facility-administered medications on file prior to visit.     Current Problems (verified) Patient Active Problem List   Diagnosis Date Noted  . Neuropathy 08/15/2018  . Insomnia 02/06/2018  . Overweight (BMI 25.0-29.9) 09/17/2015  . Hyperparathyroidism ([place])  07/25/2015  . Hypothyroidism 02/11/2014  . Osteoporosis 02/11/2014  . Vitamin D deficiency 01/22/2014  . Abnormal glucose 01/22/2014  . Medication management 01/22/2014  . Personal history of colonic polyps 04/18/2013  . Gastroesophageal reflux disease without esophagitis 02/28/2013  . Hyperlipidemia, mixed 10/07/2011  . Essential hypertension 10/21/2008  . Rheumatoid arthritis ([place]) 10/21/2008    Screening Tests Immunization History  Administered Date(s) Administered  . Influenza, High Dose Seasonal PF 07/16/2014, 09/17/2015, 07/01/2016, 08/04/2017, 08/25/2018  . Pneumococcal Conjugate-13 10/16/2015  . Pneumococcal Polysaccharide-23 11/08/2012  . Td 11/08/2012  . Zoster 09/17/2015   Preventative care: Last colonoscopy: 03/2013, 5 year follow up, Dr. Deatra Ina, patient requests to postpone  Last mammogram: 05/2018 Last pap smear/pelvic exam: remote   DEXA: 07/2016 put back on fosamax after being off of it a year, since 07/2017  DUE MRI head 2012 normal Echo 2012 normal Stress test 2016 US RENAL normal 2013 [place] AB 04/2013  Prior vaccinations: TD or Tdap: 2014  Influenza: 2019 at [place] [facility]: 2016 Pneumococcal: 2014 Shingles/Zostavax: 2016  Names of Other Physician/Practitioners you currently use: 1. [facility]- here for primary care 2. Dr. Gershon Crane, eye doctor, last visit q12 months 2019, has scheduled 08/2018 3. Dr. Orvil Feil, dentist, last 2019  Patient Care Team: [name], MD as PCP - General (Internal Medicine) Stark Klein, MD as Consulting Physician (General Surgery) Gavin Pound, MD as Consulting Physician (Rheumatology) Lelon Perla, MD as Consulting Physician (Cardiology) Inda Castle, MD (Inactive) as Consulting Physician (Gastroenterology) Starr Lake, MD as Referring Physician (Orthopedic Surgery)   Allergies Allergies  Allergen Reactions  . Ace Inhibitors Cough  . Ambien  [Zolpidem] Other (See Comments)    Odd Feeling  . Crestor [Rosuvastatin]   . Pravastatin   . Prozac [Fluoxetine Hcl] Other (See Comments)    Decreased libido  . Zoloft [Sertraline Hcl]     SURGICAL HISTORY She  has a past surgical history that includes Vaginal hysterectomy; Carpal tunnel release; Cystectomy; AV fistula repair; Bladder suspension; Pilonidal cyst excision; Trigger finger release; Bunionectomy; ERCP (09/29/2011); Tonsillectomy; Cholecystectomy (09/07/2011); Tenolysis (10/26/2011); Total shoulder replacement (Right, 02/22/2014); and Breast cyst excision. FAMILY HISTORY Her family history includes Breast cancer in her paternal aunt; Colon cancer in her maternal grandfather; Heart attack in her brother and father; Lung cancer in her mother; Prostate cancer in her paternal grandfather; Stomach cancer in her father. SOCIAL HISTORY She  reports that she quit smoking about 23 years ago. She has never used smokeless tobacco. She reports current alcohol use. She reports that she does not use drugs.  MEDICARE WELLNESS OBJECTIVES: Physical activity: Current Exercise Habits: The patient does not participate in regular exercise at present Cardiac risk factors: Cardiac Risk Factors include: advanced age (>17men, >70 women);dyslipidemia;hypertension;sedentary lifestyle Depression/mood screen:   Depression screen [facility] 2/9 02/26/2019  Decreased Interest 0  Down, Depressed, Hopeless 0  PHQ - 2 Score 0    ADLs:  In your present state of health, do you have any difficulty performing the following activities: 02/26/2019 11/26/2018  Hearing? N N  Vision? N N  Difficulty concentrating or making decisions? N N  Walking or climbing stairs? N N  Dressing or bathing? N N  Doing errands, shopping? N N  Some recent data might be hidden    Cognitive Testing  Alert? Yes  Normal Appearance?Yes  Oriented to person? Yes  Place? Yes   Time? Yes  Recall of three objects?  Yes  Can perform simple  calculations? Yes  Displays appropriate judgment?Yes  Can read the correct time from a watch face?Yes  EOL planning: Does Patient Have a Medical Advance Directive?: No Would patient like information on creating a medical advance directive?: Yes (MAU/Ambulatory/Procedural Areas - Information given)  Objective:   Blood pressure (!) 141/72, height 5\' 1"  (1.549 m), weight 157 lb 6.4 oz (71.4 kg). Body mass index is 29.74 kg/m.  General Appearance:Well sounding, in no apparent distress.  ENT/Mouth: No hoarseness, No cough for duration of visit.  Respiratory: completing full sentences without distress, without audible wheeze Neuro: Awake and oriented X 3,  Psych:  Insight and Judgment appropriate.    Medicare Attestation I have personally reviewed: The patient's medical and social history Their use of alcohol, tobacco or illicit drugs Their current medications and supplements The patient's functional ability including ADLs,fall risks, home safety risks, cognitive, and hearing and visual impairment Diet and physical activities Evidence for depression or mood disorders  The patient's weight, height, BMI, and visual acuity have been recorded in the chart.  I have made referrals, counseling, and provided education to the patient based on review of the above and I have provided the patient with a written personalized care plan for preventive services.     Vicie Mutters, PA-C   02/26/2019

## 2019-02-26 ENCOUNTER — Other Ambulatory Visit: Payer: Self-pay

## 2019-02-26 ENCOUNTER — Ambulatory Visit: Payer: Medicare PPO | Admitting: Physician Assistant

## 2019-02-26 ENCOUNTER — Encounter: Payer: Self-pay | Admitting: Physician Assistant

## 2019-02-26 VITALS — BP 141/72 | Ht 61.0 in | Wt 157.4 lb

## 2019-02-26 DIAGNOSIS — Z860100 Personal history of colon polyps, unspecified: Secondary | ICD-10-CM

## 2019-02-26 DIAGNOSIS — Z79899 Other long term (current) drug therapy: Secondary | ICD-10-CM | POA: Diagnosis not present

## 2019-02-26 DIAGNOSIS — G4700 Insomnia, unspecified: Secondary | ICD-10-CM

## 2019-02-26 DIAGNOSIS — E559 Vitamin D deficiency, unspecified: Secondary | ICD-10-CM

## 2019-02-26 DIAGNOSIS — G629 Polyneuropathy, unspecified: Secondary | ICD-10-CM

## 2019-02-26 DIAGNOSIS — M810 Age-related osteoporosis without current pathological fracture: Secondary | ICD-10-CM | POA: Diagnosis not present

## 2019-02-26 DIAGNOSIS — R6889 Other general symptoms and signs: Secondary | ICD-10-CM

## 2019-02-26 DIAGNOSIS — E663 Overweight: Secondary | ICD-10-CM

## 2019-02-26 DIAGNOSIS — E782 Mixed hyperlipidemia: Secondary | ICD-10-CM | POA: Diagnosis not present

## 2019-02-26 DIAGNOSIS — K219 Gastro-esophageal reflux disease without esophagitis: Secondary | ICD-10-CM

## 2019-02-26 DIAGNOSIS — M069 Rheumatoid arthritis, unspecified: Secondary | ICD-10-CM

## 2019-02-26 DIAGNOSIS — Z0000 Encounter for general adult medical examination without abnormal findings: Secondary | ICD-10-CM

## 2019-02-26 DIAGNOSIS — I10 Essential (primary) hypertension: Secondary | ICD-10-CM

## 2019-02-26 DIAGNOSIS — Z0001 Encounter for general adult medical examination with abnormal findings: Secondary | ICD-10-CM | POA: Diagnosis not present

## 2019-02-26 DIAGNOSIS — E039 Hypothyroidism, unspecified: Secondary | ICD-10-CM

## 2019-02-26 DIAGNOSIS — E213 Hyperparathyroidism, unspecified: Secondary | ICD-10-CM | POA: Diagnosis not present

## 2019-02-26 DIAGNOSIS — R7309 Other abnormal glucose: Secondary | ICD-10-CM

## 2019-02-26 MED ORDER — GABAPENTIN 100 MG PO CAPS: 100.0000 mg | ORAL_CAPSULE | Freq: Three times a day (TID) | ORAL | 2 refills | Status: DC | PRN

## 2019-02-28 ENCOUNTER — Other Ambulatory Visit: Payer: Self-pay | Admitting: Physician Assistant

## 2019-02-28 DIAGNOSIS — Z1231 Encounter for screening mammogram for malignant neoplasm of breast: Secondary | ICD-10-CM

## 2019-03-13 DIAGNOSIS — M810 Age-related osteoporosis without current pathological fracture: Secondary | ICD-10-CM | POA: Diagnosis not present

## 2019-03-13 DIAGNOSIS — M2550 Pain in unspecified joint: Secondary | ICD-10-CM | POA: Diagnosis not present

## 2019-03-13 DIAGNOSIS — Z79899 Other long term (current) drug therapy: Secondary | ICD-10-CM | POA: Diagnosis not present

## 2019-03-13 DIAGNOSIS — Z7189 Other specified counseling: Secondary | ICD-10-CM | POA: Diagnosis not present

## 2019-03-13 DIAGNOSIS — M0609 Rheumatoid arthritis without rheumatoid factor, multiple sites: Secondary | ICD-10-CM | POA: Diagnosis not present

## 2019-03-13 DIAGNOSIS — M65322 Trigger finger, left index finger: Secondary | ICD-10-CM | POA: Diagnosis not present

## 2019-03-13 DIAGNOSIS — M150 Primary generalized (osteo)arthritis: Secondary | ICD-10-CM | POA: Diagnosis not present

## 2019-04-10 DIAGNOSIS — F411 Generalized anxiety disorder: Secondary | ICD-10-CM

## 2019-04-10 MED ORDER — IBUPROFEN 800 MG PO TABS: 800.0000 mg | ORAL_TABLET | Freq: Three times a day (TID) | ORAL | 2 refills | Status: DC | PRN

## 2019-04-10 MED ORDER — ALPRAZOLAM 1 MG PO TABS: ORAL_TABLET | ORAL | 0 refills | Status: DC

## 2019-05-30 NOTE — Progress Notes (Signed)
[facility] [name], M.D.     [name]. [name], P.A.-C Liane Comber, [place] [address] [place] [address] Telephone [phone] Telefax [phone] Annual Screening/Preventative Visit & Comprehensive Evaluation &  Examination     This very nice 73 y.o. MWF presents for a Screening /Preventative Visit & comprehensive evaluation and management of multiple medical co-morbidities.  Patient has been followed for HTN, HLD, Prediabetes  and Vitamin D Deficiency. Patient has hx/o Rheumatoid Arthritis  for hx/o (1985) and is is followed by Dr Berna Bue on MTX and Plaquenil. She also is on Fosamax for Osteoporosis.       HTN predates since 1997. Patient's BP has been controlled at home and patient denies any cardiac symptoms as chest pain, palpitations, shortness of breath, dizziness or ankle swelling. Today's BP is at goal - 140/82.      Patient's hyperlipidemia is not controlled with diet. Last lipids were not at goal: Lab Results  Component Value Date   CHOL 199 02/21/2019   HDL 76 02/21/2019   LDLCALC 106 (H) 02/21/2019   TRIG 79 02/21/2019   CHOLHDL 2.6 02/21/2019      Patient has hx/o prediabetes (A1c 5.8% / 2011)  and patient denies reactive hypoglycemic symptoms, visual blurring, diabetic polys or paresthesias. Last A1c was Normal & at goal: Lab Results  Component Value Date   HGBA1C 5.3 02/21/2019      Patient has been on thyroid replacement since the 1980's.     She has history of Vitamin D Deficiency ("38" in 2008) and last Vitamin D was at goal: Lab Results  Component Value Date   VD25OH 77 02/21/2019   Current Outpatient Medications on File Prior to Visit  Medication Sig  . alendronate (FOSAMAX) 70 MG tablet TAKE 1 TABLET ONCE WEEKLY WITH A FULL GLASS OF WATER ON AN EMPTY STOMACH FOR 30 MINUTES.  Marland Kitchen ALPRAZolam (XANAX) 1 MG tablet Take 1/2 to 1 tablet 1 to 2 x / day only if  needed for Acute Anxiety Attack and please try to limit to 5 days /week to avoid addiction  . aspirin 81 MG tablet Take 81 mg by mouth daily.  Marland Kitchen b complex vitamins capsule Take 1 capsule by mouth daily.  Marland Kitchen BIOTIN PO Take 1 tablet by mouth daily.  . Cholecalciferol (VITAMIN D PO) Take 5,000 Units by mouth daily.   . folic acid (FOLVITE) 1 MG tablet Take 3 mg by mouth daily.   Marland Kitchen gabapentin (NEURONTIN) 100 MG capsule Take 1 capsule (100 mg total) by mouth 3 (three) times daily as needed.  Marland Kitchen GLUCOSAMINE-CHONDROITIN PO Take 1 tablet by mouth daily.  . hydroxychloroquine (PLAQUENIL) 200 MG tablet Take by mouth daily. Take 1 tab AM and PM  . ibuprofen (ADVIL) 800 MG tablet Take 1 tablet (800 mg total) by mouth every 8 (eight) hours as needed for mild pain or moderate pain. PRN  . levothyroxine (SYNTHROID, LEVOTHROID) 112 MCG tablet Take 1 tablet daily on an empty stomach with only water for 30 minutes & No Antacid medications for 4 hours  . losartan (COZAAR) 50 MG tablet TAKE 1 TABLET EVERY DAY FOR BLOOD PRESSURE  . Magnesium 500 MG TABS Take 1 tablet by mouth daily.  Marland Kitchen MELATONIN PO Take 1 tablet by mouth at bedtime.  . methotrexate (RHEUMATREX) 2.5 MG tablet Take 2.5 mg by mouth once a week. Patient injects 2.5 mg /ml 1 ml [place] weekly.  Marland Kitchen  omeprazole (PRILOSEC) 40 MG capsule TAKE 1 CAPSULE TWICE DAILY FOR ACID REFLUX  . OVER THE COUNTER MEDICATION Takes hair, skin and nails 1 time daily.  . Probiotic CAPS Take 1 capsule by mouth daily.   No current facility-administered medications on file prior to visit.    Allergies  Allergen Reactions  . Ace Inhibitors Cough  . Ambien [Zolpidem] Other (See Comments)    Odd Feeling  . Crestor [Rosuvastatin]   . Pravastatin   . Prozac [Fluoxetine Hcl] Other (See Comments)    Decreased libido  . Zoloft [Sertraline Hcl]    Past Medical History:  Diagnosis Date  . Anemia    Hx of   . Arthritis   . Calculus of bile duct without mention of cholecystitis or  obstruction   . Cataract   . GERD (gastroesophageal reflux disease)   . H. pylori infection    Hx of   . Heart murmur   . Hyperlipidemia   . Hypertension   . Hypothyroid   . PUD (peptic ulcer disease)   . Rheumatoid arthritis(714.0)    Health Maintenance  Topic Date Due  . COLONOSCOPY  03/08/2018  . Hepatitis C Screening  08/16/2019 (Originally 01-22-1946)  . INFLUENZA VACCINE  06/09/2019  . MAMMOGRAM  05/24/2020  . TETANUS/TDAP  11/08/2022  . DEXA SCAN  Completed  . PNA vac Low Risk Adult  Completed   Immunization History  Administered Date(s) Administered  . Influenza, High Dose Seasonal PF 07/16/2014, 09/17/2015, 07/01/2016, 08/04/2017, 08/25/2018  . Pneumococcal Conjugate-13 10/16/2015  . Pneumococcal Polysaccharide-23 11/08/2012  . Td 11/08/2012  . Zoster 09/17/2015   Last Colon - 03/08/2013 - Dr Deatra Ina - recc 10 yr f/u - due May 2024  Last MGM - 05/24/2018 - scheduled for 06/05/2019  Past Surgical History:  Procedure Laterality Date  . AV FISTULA REPAIR    . BLADDER SUSPENSION    . BREAST CYST EXCISION    . BUNIONECTOMY    . CARPAL TUNNEL RELEASE    . CHOLECYSTECTOMY  09/07/2011  . CYSTECTOMY     left breast  . ERCP  09/29/2011   Procedure: ENDOSCOPIC RETROGRADE CHOLANGIOPANCREATOGRAPHY (ERCP);  Surgeon: Inda Castle, MD;  Location: [facility];  Service: Endoscopy;  Laterality: N/A;  . PILONIDAL CYST EXCISION    . TENOLYSIS  10/26/2011   Procedure: TENDON SHEATH RELEASE/TENOLYSIS;  Surgeon: Wynonia Sours, MD;  Location: [facility];  Service: Orthopedics;  Laterality: Right;  tenosynovectomy removal superficialis slip right index finger  . TONSILLECTOMY    . TOTAL SHOULDER REPLACEMENT Right 02/22/2014   [facility]  . TRIGGER FINGER RELEASE    . VAGINAL HYSTERECTOMY     ovaries not removed   Family History  Problem Relation Age of Onset  . Lung cancer Mother   . Stomach cancer Father   . Heart attack Father        MI at age  8  . Colon cancer Maternal Grandfather   . Heart attack Brother        MI at age 57  . Prostate cancer Paternal Grandfather   . Breast cancer Paternal Aunt    Social History   Tobacco Use  . Smoking status: Former Smoker    Quit date: 10/25/1995    Years since quitting: 23.6  . Smokeless tobacco: Never Used  . Tobacco comment: Maryann Conners [id number]  Substance Use Topics  . Alcohol use: Yes    Alcohol/week: 0.0 standard drinks  Comment: occasional  . Drug use: No    ROS Constitutional: Denies fever, chills, weight loss/gain, headaches, insomnia,  night sweats, and change in appetite. Does c/o fatigue. Eyes: Denies redness, blurred vision, diplopia, discharge, itchy, watery eyes.  ENT: Denies discharge, congestion, post nasal drip, epistaxis, sore throat, earache, hearing loss, dental pain, Tinnitus, Vertigo, Sinus pain, snoring.  Cardio: Denies chest pain, palpitations, irregular heartbeat, syncope, dyspnea, diaphoresis, orthopnea, PND, claudication, edema Respiratory: denies cough, dyspnea, DOE, pleurisy, hoarseness, laryngitis, wheezing.  Gastrointestinal: Denies dysphagia, heartburn, reflux, water brash, pain, cramps, nausea, vomiting, bloating, diarrhea, constipation, hematemesis, melena, hematochezia, jaundice, hemorrhoids Genitourinary: Denies dysuria, frequency, urgency, nocturia, hesitancy, discharge, hematuria, flank pain Breast: Breast lumps, nipple discharge, bleeding.  Musculoskeletal: Denies arthralgia, myalgia, stiffness, Jt. Swelling, pain, limp, and strain/sprain. Denies falls. Skin: Denies puritis, rash, hives, warts, acne, eczema, changing in skin lesion Neuro: No weakness, tremor, incoordination, spasms, paresthesia, pain Psychiatric: Denies confusion, memory loss, sensory loss. Denies Depression. Endocrine: Denies change in weight, skin, hair change, nocturia, and paresthesia, diabetic polys, visual blurring, hyper / hypo glycemic episodes.  Heme/Lymph: No excessive  bleeding, bruising, enlarged lymph nodes.  Physical Exam  BP 140/82   Pulse 60   Temp (!) 97.5 F (36.4 C)   Ht 5\' 2"  (1.575 m)   Wt 159 lb 6.4 oz (72.3 kg)   SpO2 99%   BMI 29.15 kg/m   General Appearance: Well nourished, well groomed and in no apparent distress.  Eyes: PERRLA, EOMs, conjunctiva no swelling or erythema, normal fundi and vessels. Sinuses: No frontal/maxillary tenderness ENT/Mouth: EACs patent / TMs  nl. Nares clear without erythema, swelling, mucoid exudates. Oral hygiene is good. No erythema, swelling, or exudate. Tongue normal, non-obstructing. Tonsils not swollen or erythematous. Hearing normal.  Neck: Supple, thyroid not palpable. No bruits, nodes or JVD. Respiratory: Respiratory effort normal.  BS equal and clear bilateral without rales, rhonci, wheezing or stridor. Cardio: Heart sounds are normal with regular rate and rhythm and no murmurs, rubs or gallops. Peripheral pulses are normal and equal bilaterally without edema. No aortic or femoral bruits. Chest: symmetric with normal excursions and percussion. Breasts: Symmetric, without lumps, nipple discharge, retractions, or fibrocystic changes.  Abdomen: Flat, soft with bowel sounds active. Nontender, no guarding, rebound, hernias, masses, or organomegaly.  Lymphatics: Non tender without lymphadenopathy.  Genitourinary:  Musculoskeletal: Full ROM all peripheral extremities, joint stability, 5/5 strength, and normal gait. Skin: Warm and dry without rashes, lesions, cyanosis, clubbing or  ecchymosis.  Neuro: Cranial nerves intact, reflexes equal bilaterally. Normal muscle tone, no cerebellar symptoms. Sensation intact.  Pysch: Alert and oriented X 3, normal affect, Insight and Judgment appropriate.   Assessment and Plan  1. Annual Preventative Screening Examination  2. Essential hypertension  - EKG 12-Lead - Urinalysis, Routine w reflex microscopic - Microalbumin / creatinine urine ratio - CBC with  Differential/Platelet - COMPLETE METABOLIC PANEL WITH GFR - Magnesium - TSH  3. Hyperlipidemia, mixed  - EKG 12-Lead - Lipid panel - TSH  4. Abnormal glucose  - EKG 12-Lead - Hemoglobin A1c - Insulin, random  5. Vitamin D deficiency  - VITAMIN D 25 Hydroxyl  6. Prediabetes  - EKG 12-Lead - Hemoglobin A1c - Insulin, random  7. Hypothyroidism  - TSH  8. Rheumatoid arthritis involving multiple sites ([place])   9. Gastroesophageal reflux disease  - CBC with Differential/Platelet  10. Screening for colorectal cancer  - POC Hemoccult Bld/Stl  11. Screening for ischemic heart disease  - EKG 12-Lead  12. FHx:  heart disease  - EKG 12-Lead  13. Former smoker  - EKG 12-Lead  14. Medication management  - Urinalysis, Routine w reflex microscopic - Microalbumin / creatinine urine ratio - CBC with Differential/Platelet - COMPLETE METABOLIC PANEL WITH GFR - Magnesium - Lipid panel - TSH - Hemoglobin A1c - Insulin, random - VITAMIN D 25 Hydroxyl       Patient was counseled in prudent diet to achieve/maintain BMI less than 25 for weight control, BP monitoring, regular exercise and medications. Discussed med's effects and SE's. Screening labs and tests as requested with regular follow-up as recommended. Over 40 minutes of exam, counseling, chart review and high complex critical decision making was performed.   Kirtland Bouchard, MD

## 2019-05-31 ENCOUNTER — Other Ambulatory Visit: Payer: Self-pay

## 2019-05-31 ENCOUNTER — Encounter: Payer: Self-pay | Admitting: Internal Medicine

## 2019-05-31 ENCOUNTER — Ambulatory Visit (INDEPENDENT_AMBULATORY_CARE_PROVIDER_SITE_OTHER): Payer: Medicare PPO | Admitting: Internal Medicine

## 2019-05-31 VITALS — BP 140/82 | HR 60 | Temp 97.5°F | Ht 62.0 in | Wt 159.4 lb

## 2019-05-31 DIAGNOSIS — Z8249 Family history of ischemic heart disease and other diseases of the circulatory system: Secondary | ICD-10-CM

## 2019-05-31 DIAGNOSIS — E559 Vitamin D deficiency, unspecified: Secondary | ICD-10-CM | POA: Diagnosis not present

## 2019-05-31 DIAGNOSIS — K219 Gastro-esophageal reflux disease without esophagitis: Secondary | ICD-10-CM

## 2019-05-31 DIAGNOSIS — Z87891 Personal history of nicotine dependence: Secondary | ICD-10-CM | POA: Diagnosis not present

## 2019-05-31 DIAGNOSIS — Z79899 Other long term (current) drug therapy: Secondary | ICD-10-CM | POA: Diagnosis not present

## 2019-05-31 DIAGNOSIS — Z1211 Encounter for screening for malignant neoplasm of colon: Secondary | ICD-10-CM

## 2019-05-31 DIAGNOSIS — E039 Hypothyroidism, unspecified: Secondary | ICD-10-CM | POA: Diagnosis not present

## 2019-05-31 DIAGNOSIS — Z0001 Encounter for general adult medical examination with abnormal findings: Secondary | ICD-10-CM

## 2019-05-31 DIAGNOSIS — Z0000 Encounter for general adult medical examination without abnormal findings: Secondary | ICD-10-CM | POA: Diagnosis not present

## 2019-05-31 DIAGNOSIS — Z136 Encounter for screening for cardiovascular disorders: Secondary | ICD-10-CM

## 2019-05-31 DIAGNOSIS — E782 Mixed hyperlipidemia: Secondary | ICD-10-CM | POA: Diagnosis not present

## 2019-05-31 DIAGNOSIS — M069 Rheumatoid arthritis, unspecified: Secondary | ICD-10-CM

## 2019-05-31 DIAGNOSIS — R7309 Other abnormal glucose: Secondary | ICD-10-CM | POA: Diagnosis not present

## 2019-05-31 DIAGNOSIS — R7303 Prediabetes: Secondary | ICD-10-CM | POA: Diagnosis not present

## 2019-05-31 DIAGNOSIS — I10 Essential (primary) hypertension: Secondary | ICD-10-CM | POA: Diagnosis not present

## 2019-05-31 NOTE — Patient Instructions (Signed)

## 2019-06-01 LAB — CBC WITH DIFFERENTIAL/PLATELET
Absolute Monocytes: 282 cells/uL (ref 200–950)
Basophils Absolute: 61 cells/uL (ref 0–200)
Basophils Relative: 1.8 %
Eosinophils Absolute: 303 cells/uL (ref 15–500)
Eosinophils Relative: 8.9 %
HCT: 38.1 % (ref 35.0–45.0)
Hemoglobin: 12.2 g/dL (ref 11.7–15.5)
Lymphs Abs: 1176 cells/uL (ref 850–3900)
MCH: 28.8 pg (ref 27.0–33.0)
MCHC: 32 g/dL (ref 32.0–36.0)
MCV: 90.1 fL (ref 80.0–100.0)
MPV: 13.1 fL — ABNORMAL HIGH (ref 7.5–12.5)
Monocytes Relative: 8.3 %
Neutro Abs: 1578 cells/uL (ref 1500–7800)
Neutrophils Relative %: 46.4 %
Platelets: 150 10*3/uL (ref 140–400)
RBC: 4.23 10*6/uL (ref 3.80–5.10)
RDW: 14.5 % (ref 11.0–15.0)
Total Lymphocyte: 34.6 %
WBC: 3.4 10*3/uL — ABNORMAL LOW (ref 3.8–10.8)

## 2019-06-01 LAB — URINALYSIS, ROUTINE W REFLEX MICROSCOPIC
Bilirubin Urine: NEGATIVE
Glucose, UA: NEGATIVE
Hgb urine dipstick: NEGATIVE
Ketones, ur: NEGATIVE
Leukocytes,Ua: NEGATIVE
Nitrite: NEGATIVE
Protein, ur: NEGATIVE
Specific Gravity, Urine: 1.009 (ref 1.001–1.03)
pH: 7.5 (ref 5.0–8.0)

## 2019-06-01 LAB — LIPID PANEL
Cholesterol: 184 mg/dL (ref <200)
HDL: 64 mg/dL (ref >=50)
LDL Cholesterol (Calc): 103 mg/dL (calc) — ABNORMAL HIGH
Non-HDL Cholesterol (Calc): 120 mg/dL (calc) (ref <130)
Total CHOL/HDL Ratio: 2.9 (calc) (ref <5.0)
Triglycerides: 84 mg/dL (ref <150)

## 2019-06-01 LAB — HEMOGLOBIN A1C
Hgb A1c MFr Bld: 5.2 % of total Hgb (ref <5.7)
Mean Plasma Glucose: 103 (calc)
eAG (mmol/L): 5.7 (calc)

## 2019-06-01 LAB — TSH: TSH: 2.74 mIU/L (ref 0.40–4.50)

## 2019-06-01 LAB — COMPLETE METABOLIC PANEL WITH GFR
AG Ratio: 2.1 (calc) (ref 1.0–2.5)
ALT: 16 U/L (ref 6–29)
AST: 28 U/L (ref 10–35)
Albumin: 4.2 g/dL (ref 3.6–5.1)
Alkaline phosphatase (APISO): 85 U/L (ref 37–153)
BUN/Creatinine Ratio: 8 (calc) (ref 6–22)
BUN: 8 mg/dL (ref 7–25)
CO2: 27 mmol/L (ref 20–32)
Calcium: 10.3 mg/dL (ref 8.6–10.4)
Chloride: 105 mmol/L (ref 98–110)
Creat: 0.95 mg/dL — ABNORMAL HIGH (ref 0.60–0.93)
GFR, Est African American: 69 mL/min/{1.73_m2} (ref >=60)
GFR, Est Non African American: 60 mL/min/{1.73_m2} (ref >=60)
Globulin: 2 g/dL (calc) (ref 1.9–3.7)
Glucose, Bld: 88 mg/dL (ref 65–99)
Potassium: 4.4 mmol/L (ref 3.5–5.3)
Sodium: 140 mmol/L (ref 135–146)
Total Bilirubin: 0.6 mg/dL (ref 0.2–1.2)
Total Protein: 6.2 g/dL (ref 6.1–8.1)

## 2019-06-01 LAB — MICROALBUMIN / CREATININE URINE RATIO
Creatinine, Urine: 52 mg/dL (ref 20–275)
Microalb, Ur: <0.2 mg/dL

## 2019-06-01 LAB — MAGNESIUM: Magnesium: 2 mg/dL (ref 1.5–2.5)

## 2019-06-01 LAB — INSULIN, RANDOM: Insulin: 5.2 u[IU]/mL

## 2019-06-01 LAB — VITAMIN D 25 HYDROXY (VIT D DEFICIENCY, FRACTURES): Vit D, 25-Hydroxy: 92 ng/mL (ref 30–100)

## 2019-06-02 ENCOUNTER — Encounter: Payer: Self-pay | Admitting: Internal Medicine

## 2019-06-05 ENCOUNTER — Other Ambulatory Visit: Payer: Medicare PPO

## 2019-06-05 ENCOUNTER — Ambulatory Visit: Payer: Medicare PPO

## 2019-06-08 ENCOUNTER — Ambulatory Visit
Admission: RE | Admit: 2019-06-08 | Discharge: 2019-06-08 | Disposition: A | Discharge status: Home / Self Care | Payer: Medicare PPO | Source: Ambulatory Visit | Attending: Physician Assistant | Admitting: Physician Assistant

## 2019-06-08 ENCOUNTER — Other Ambulatory Visit: Payer: Self-pay

## 2019-06-08 DIAGNOSIS — M85851 Other specified disorders of bone density and structure, right thigh: Secondary | ICD-10-CM | POA: Diagnosis not present

## 2019-06-08 DIAGNOSIS — M810 Age-related osteoporosis without current pathological fracture: Secondary | ICD-10-CM | POA: Diagnosis not present

## 2019-06-08 DIAGNOSIS — Z780 Asymptomatic menopausal state: Secondary | ICD-10-CM | POA: Diagnosis not present

## 2019-06-13 ENCOUNTER — Telehealth: Payer: Self-pay | Admitting: *Deleted

## 2019-06-13 DIAGNOSIS — M2550 Pain in unspecified joint: Secondary | ICD-10-CM | POA: Diagnosis not present

## 2019-06-13 DIAGNOSIS — Z79899 Other long term (current) drug therapy: Secondary | ICD-10-CM | POA: Diagnosis not present

## 2019-06-13 DIAGNOSIS — M150 Primary generalized (osteo)arthritis: Secondary | ICD-10-CM | POA: Diagnosis not present

## 2019-06-13 DIAGNOSIS — M0609 Rheumatoid arthritis without rheumatoid factor, multiple sites: Secondary | ICD-10-CM | POA: Diagnosis not present

## 2019-06-13 NOTE — Telephone Encounter (Signed)
Recent Bone Density and last labs faxed to [facility], at the patient's request.

## 2019-07-10 ENCOUNTER — Other Ambulatory Visit: Payer: Self-pay

## 2019-07-10 ENCOUNTER — Ambulatory Visit
Admission: RE | Admit: 2019-07-10 | Discharge: 2019-07-10 | Disposition: A | Discharge status: Home / Self Care | Payer: Medicare PPO | Source: Ambulatory Visit | Attending: Physician Assistant | Admitting: Physician Assistant

## 2019-07-10 DIAGNOSIS — Z1231 Encounter for screening mammogram for malignant neoplasm of breast: Secondary | ICD-10-CM

## 2019-08-15 ENCOUNTER — Ambulatory Visit (INDEPENDENT_AMBULATORY_CARE_PROVIDER_SITE_OTHER): Payer: Medicare PPO | Admitting: Orthopaedic Surgery

## 2019-08-15 ENCOUNTER — Encounter: Payer: Self-pay | Admitting: Orthopaedic Surgery

## 2019-08-15 ENCOUNTER — Other Ambulatory Visit: Payer: Self-pay

## 2019-08-15 DIAGNOSIS — M25571 Pain in right ankle and joints of right foot: Secondary | ICD-10-CM | POA: Diagnosis not present

## 2019-08-15 DIAGNOSIS — M79671 Pain in right foot: Secondary | ICD-10-CM | POA: Diagnosis not present

## 2019-08-15 MED ORDER — METHYLPREDNISOLONE ACETATE 40 MG/ML IJ SUSP
40.0000 mg | INTRAMUSCULAR | Status: AC | PRN
  Administered 2019-08-15: 40 mg via INTRA_ARTICULAR

## 2019-08-15 MED ORDER — LIDOCAINE HCL 1 % IJ SOLN
2.0000 mL | INTRAMUSCULAR | Status: AC | PRN
  Administered 2019-08-15: 2 mL

## 2019-08-15 NOTE — Progress Notes (Signed)
Office Visit Note   Patient: [name]           Date of Birth: 24-Jul-1946           MRN: [id number] Visit Date: 08/15/2019              Requested by: [name], [address],  [place] [id number] PCP: [name], MD   Assessment & Plan: Visit Diagnoses:  1. Pain in right ankle and joints of right foot     Plan: Given the fact that she still feels ankle pain and discomfort I recommended at least a steroid injection in the ankle joint itself to see how this would help with her symptoms and trying an ASO which they are wearing her regular shoes.  She agreed with this treatment plan and did tolerate the injection well.  I explained the risk and benefits of steroid injections.  She will wear the ASO only when she is mobilizing.  All question concerns were answered addressed.  We can see her back in about 4 weeks to see how she is done overall.  Follow-Up Instructions: Return in about 4 weeks (around 09/12/2019).   Orders:  No orders of the defined types were placed in this encounter.  No orders of the defined types were placed in this encounter.     Procedures: Medium Joint Inj: R ankle on 08/15/2019 5:40 PM Medications: 2 mL lidocaine 1 %; 40 mg methylPREDNISolone acetate 40 MG/ML      Clinical Data: No additional findings.   Subjective: Chief Complaint  Patient presents with  . Right Foot - Pain  The patient is someone I am seeing for the first time.  She is 73 years old and has a history of bilateral foot pain with neuropathy that is been longstanding.  She does take Neurontin which it sounds like is 200 mg in the morning and 100 mg in the evening.  She is not a diabetic.  It is been uncertain as to the etiology of her neuropathy.  Last year she did have an MRI of her right foot due to severe pain and I did find a calcaneus stress fracture and she was treated in a cam walking boot.  She has been been told to wear more supportive  athletic shoes and she is wearing some today that I was able to see and they are nice fitting and are appropriate in terms of the stiffness.  She reports anterior ankle and shin pain and deep ankle pain has been going on for some time now. HPI  Review of Systems She currently denies any headache, chest pain, shortness of breath, fever, chills, nausea, vomiting  Objective: Vital Signs: There were no vitals taken for this visit.  Physical Exam She is alert and orient x3 and in no acute distress Ortho Exam Examination of her right ankle and foot shows good range of motion of the ankle.  The ankle is ligamentously stable.  There is some knee pain but no effusion.  There is an incision over the dorsal aspect of the first ray which she states was from bunion surgery and this appears to have corrected her bunion deformity.  Her foot is well-perfused but with decreased sensation.  She has nice palpable pulse with dorsalis pedis and posterior tibial pulse.  There is no pain along the peroneal tendons or the posterior tibial tendons or the anterior tendons of the ankle basically other than pain there was no  gross findings of abnormalities anatomically of her right foot and ankle. Specialty Comments:  No specialty comments available.  Imaging: No results found.   PMFS History: Patient Active Problem List   Diagnosis Date Noted  . Neuropathy 08/15/2018  . Insomnia 02/06/2018  . Overweight (BMI 25.0-29.9) 09/17/2015  . Hyperparathyroidism ([place]) 07/25/2015  . Hypothyroidism 02/11/2014  . Osteoporosis 02/11/2014  . Vitamin D deficiency 01/22/2014  . Abnormal glucose 01/22/2014  . Medication management 01/22/2014  . Personal history of colonic polyps 04/18/2013  . Gastroesophageal reflux disease without esophagitis 02/28/2013  . Hyperlipidemia, mixed 10/07/2011  . Essential hypertension 10/21/2008  . Rheumatoid arthritis ([place]) 10/21/2008   Past Medical History:  Diagnosis Date  . Anemia     Hx of   . Arthritis   . Calculus of bile duct without mention of cholecystitis or obstruction   . Cataract   . GERD (gastroesophageal reflux disease)   . H. pylori infection    Hx of   . Heart murmur   . Hyperlipidemia   . Hypertension   . Hypothyroid   . PUD (peptic ulcer disease)   . Rheumatoid arthritis(714.0)     Family History  Problem Relation Age of Onset  . Lung cancer Mother   . Stomach cancer Father   . Heart attack Father        MI at age 67  . Colon cancer Maternal Grandfather   . Heart attack Brother        MI at age 62  . Prostate cancer Paternal Grandfather   . Breast cancer Paternal Aunt     Past Surgical History:  Procedure Laterality Date  . AV FISTULA REPAIR    . BLADDER SUSPENSION    . BREAST CYST EXCISION Left   . BUNIONECTOMY    . CARPAL TUNNEL RELEASE    . CHOLECYSTECTOMY  09/07/2011  . CYSTECTOMY     left breast  . ERCP  09/29/2011   Procedure: ENDOSCOPIC RETROGRADE CHOLANGIOPANCREATOGRAPHY (ERCP);  Surgeon: Inda Castle, MD;  Location: [facility];  Service: Endoscopy;  Laterality: N/A;  . PILONIDAL CYST EXCISION    . TENOLYSIS  10/26/2011   Procedure: TENDON SHEATH RELEASE/TENOLYSIS;  Surgeon: Wynonia Sours, MD;  Location: [facility];  Service: Orthopedics;  Laterality: Right;  tenosynovectomy removal superficialis slip right index finger  . TONSILLECTOMY    . TOTAL SHOULDER REPLACEMENT Right 02/22/2014   [facility]rig[facility]  . TRIGGER FINGER RELEASE    . VAGINAL HYSTERECTOMY     ovaries not removed   Social History   Occupational History  . Occupation: retired    Fish farm manager: RETIRED    Comment: Retired  Tobacco Use  . Smoking status: Former Smoker    Quit date: 10/25/1995    Years since quitting: 23.8  . Smokeless tobacco: Never Used  . Tobacco comment: Maryann Conners [id number]  Substance and Sexual Activity  . Alcohol use: Yes    Alcohol/week: 0.0 standard drinks    Comment: occasional  . Drug use: No  . Sexual  activity: Yes    Birth control/protection: Post-menopausal

## 2019-08-27 ENCOUNTER — Ambulatory Visit: Payer: Self-pay | Admitting: Adult Health

## 2019-09-01 NOTE — Progress Notes (Signed)
3 Month Follow Up   Assessment and Plan:     Randel was seen today for follow-up.  Diagnoses and all orders for this visit:  Miryam was seen today for follow-up.  Diagnoses and all orders for this visit:  Essential hypertension -     CBC with Differential/Platelet -     COMPLETE METABOLIC PANEL WITH GFR -     Magnesium -     losartan (COZAAR) 25 MG tablet; Take 1 tablet (25 mg total) by mouth daily.  Hyperlipidemia, mixed Cholesterol Controlled with diet and exercise Continue low cholesterol diet and exercise.  Check lipid panel.  -     Lipid panel  Gastroesophageal reflux disease without esophagitis Doing well at this time Continue: Prilosec 40mg  Diet discussed Monitor for triggers Avoid food with high acid content Avoid excessive cafeine Increase water intake  Vitamin D deficiency Continue supplementation 5,000IU daily Defer labs this visit  Abnormal glucose Discussed dietary and exercise modifications  Rheumatoid arthritis involving multiple sites, unspecified whether rheumatoid factor present [facility]) Follows with Dr Lenna Gilford. MTX and palquenil  Osteoporosis, unspecified osteoporosis type, unspecified pathological fracture presence Not taking Fosamax at this time Consider restart  Hypothyroidism, unspecified type Hypothyroidism Taking levothyroxine 174mcg daily Reminder to take on an empty stomach 30-84mins before first meal of the day. No antacid medications for 4 hours. -     TSH -     Magnesium  Overweight (BMI 25.0-29.9) Discussed dietary and exercise modifications  Neuropathy Doing well at this time Taking Gabapentin 100mg  TID as needed  Insomnia, unspecified type Doing well at this time Using Melatonin  Medication management Continued  Need for hepatitis B screening test -     Hepatitis C antibody     Continue diet and meds as discussed. Further disposition pending results of labs. Discussed med's effects and SE's.  Patient agrees  with plan of care and opportunity to ask questions/voice concerns. Over 30 minutes of chart review, interview, exam, counseling, and critical decision making was performed.   Future Appointments  Date Time Provider [place]  09/12/2019  1:15 PM Mcarthur Rossetti, MD [facility] None  12/06/2019 10:30 AM [name], MD [facility] None  07/08/2020 10:00 AM [name], MD [facility] None    ----------------------------------------------------------------------------------------------------------------------  HPI 73 y.o. female  presents for 3 month follow up on HTN, HLD,history of pre-diabetes (abnormal glucose), weight and vitamin D deficiency. She has hostory of Rheumatoid Arthritis (1985) and taking MTX and plaquenil and follows with Dr Lenna Gilford.  She also has history of osteoporosiss an is on Fosamax.  She had an appointment with Dr Ninfa Linden regarding her right foot pain.  She had a stress fx there in July.  She is currentlhy wearing a brace and she reports it hurts when she wear the brace or does not wear the brace.  She has a follow up on Nov 4th for this.   BMI is Body mass index is 29.45 kg/m., she has been working on diet and exercise. Wt Readings from Last 3 Encounters:  09/03/19 161 lb (73 kg)  05/31/19 159 lb 6.4 oz (72.3 kg)  02/26/19 157 lb 6.4 oz (71.4 kg)    HTN predates 1997 Her blood pressure has been controlled at home, today their BP is BP: 134/72  She does workout. She denies any cardiac symptoms, chest pains, palpitations, shortness of breath, dizziness or lower extremity edema.     She is not on cholesterol medication. Her cholesterol is not at goal. The  cholesterol last visit was:   Lab Results  Component Value Date   CHOL 210 (H) 09/03/2019   HDL 73 09/03/2019   LDLCALC 119 (H) 09/03/2019   TRIG 79 09/03/2019   CHOLHDL 2.9 09/03/2019    She has been working on diet and exercise for prediabetes (A1c 5.8% / 2011), and denies increased  appetite, nausea, polydipsia, polyuria, visual disturbances, vomiting and weight loss. Last A1C in the office was:  Lab Results  Component Value Date   HGBA1C 5.2 05/31/2019   Patient is on Vitamin D supplement.   Lab Results  Component Value Date   VD25OH 92 05/31/2019     Patient has hypothyroidism and on replacement since 1980's.  Currently taking levothyroxine 175mcg daily.  Her medication was not changed last visit. Lab Results  Component Value Date   TSH 1.08 09/03/2019            Current Medications:  Current Outpatient Medications on File Prior to Visit  Medication Sig  . ALPRAZolam (XANAX) 1 MG tablet Take 1/2 to 1 tablet 1 to 2 x / day only if needed for Acute Anxiety Attack and please try to limit to 5 days /week to avoid addiction  . aspirin 81 MG tablet Take 81 mg by mouth daily.  Marland Kitchen b complex vitamins capsule Take 1 capsule by mouth daily.  Marland Kitchen BIOTIN PO Take 1 tablet by mouth daily.  . Cholecalciferol (VITAMIN D PO) Take 5,000 Units by mouth daily.   . folic acid (FOLVITE) 1 MG tablet Take 3 mg by mouth daily.   Marland Kitchen gabapentin (NEURONTIN) 100 MG capsule Take 1 capsule (100 mg total) by mouth 3 (three) times daily as needed.  Marland Kitchen GLUCOSAMINE-CHONDROITIN PO Take 1 tablet by mouth daily.  . hydroxychloroquine (PLAQUENIL) 200 MG tablet Take by mouth daily. Take 1 tab AM and PM  . ibuprofen (ADVIL) 800 MG tablet Take 1 tablet (800 mg total) by mouth every 8 (eight) hours as needed for mild pain or moderate pain. PRN  . levothyroxine (SYNTHROID, LEVOTHROID) 112 MCG tablet Take 1 tablet daily on an empty stomach with only water for 30 minutes & No Antacid medications for 4 hours  . Magnesium 500 MG TABS Take 1 tablet by mouth daily.  Marland Kitchen MELATONIN PO Take 1 tablet by mouth at bedtime.  . methotrexate (RHEUMATREX) 2.5 MG tablet Take 2.5 mg by mouth once a week. Patient injects 2.5 mg /ml 1 ml [place] weekly.  Marland Kitchen omeprazole (PRILOSEC) 40 MG capsule TAKE 1 CAPSULE TWICE DAILY FOR ACID  REFLUX  . Probiotic CAPS Take 1 capsule by mouth daily.  Marland Kitchen alendronate (FOSAMAX) 70 MG tablet TAKE 1 TABLET ONCE WEEKLY WITH A FULL GLASS OF WATER ON AN EMPTY STOMACH FOR 30 MINUTES. (Patient not taking: Reported on 09/03/2019)  . OVER THE COUNTER MEDICATION Takes hair, skin and nails 1 time daily.   No current facility-administered medications on file prior to visit.     Allergies:  Allergies  Allergen Reactions  . Ace Inhibitors Cough  . Ambien [Zolpidem] Other (See Comments)    Odd Feeling  . Crestor [Rosuvastatin]   . Pravastatin   . Prozac [Fluoxetine Hcl] Other (See Comments)    Decreased libido  . Zoloft [Sertraline Hcl]      Medical History:  Past Medical History:  Diagnosis Date  . Anemia    Hx of   . Arthritis   . Calculus of bile duct without mention of cholecystitis or obstruction   .  Cataract   . GERD (gastroesophageal reflux disease)   . H. pylori infection    Hx of   . Heart murmur   . Hyperlipidemia   . Hypertension   . Hypothyroid   . PUD (peptic ulcer disease)   . Rheumatoid arthritis(714.0)     Family history- Reviewed and unchanged   Social history- Reviewed and unchanged   Names of Other Physician/Practitioners you currently use: 1. [facility] here for primary care 2. Eye Exam: Due for 09/2019 3. Dental Exam  [place] 09/2019  Patient Care Team: [name], MD as PCP - General (Internal Medicine) Stark Klein, MD as Consulting Physician (General Surgery) Gavin Pound, MD as Consulting Physician (Rheumatology) Lelon Perla, MD as Consulting Physician (Cardiology) Inda Castle, MD (Inactive) as Consulting Physician (Gastroenterology) Starr Lake, MD as Referring Physician (Orthopedic Surgery)   Screening Tests: Immunization History  Administered Date(s) Administered  . Influenza, High Dose Seasonal PF 07/16/2014, 09/17/2015, 07/01/2016, 08/04/2017, 08/25/2018  .  Pneumococcal Conjugate-13 10/16/2015  . Pneumococcal Polysaccharide-23 11/08/2012  . Td 11/08/2012  . Zoster 09/17/2015     Vaccinations: TD or Tdap: 11/2012  Influenza: Declined  Pneumococcal: 11/2012 Prevnar13: 10/2015 Shingles: Zostavax: 09/2015   Preventative Care: Last colonoscopy: 04/2013 Last mammogram: 07/2019 DEXA:05/2019  Hep C screening [id number]): DUE    Review of Systems:  Review of Systems  Constitutional: Negative for chills, diaphoresis, fever, malaise/fatigue and weight loss.  HENT: Negative for congestion, ear discharge, ear pain, hearing loss, nosebleeds, sinus pain, sore throat and tinnitus.   Eyes: Negative for blurred vision, double vision, photophobia, pain, discharge and redness.  Respiratory: Negative for cough, hemoptysis, sputum production, shortness of breath, wheezing and stridor.   Cardiovascular: Negative for chest pain, palpitations, orthopnea, claudication, leg swelling and PND.  Gastrointestinal: Negative for abdominal pain, blood in stool, constipation, diarrhea, heartburn, melena, nausea and vomiting.  Genitourinary: Negative for dysuria, flank pain, frequency, hematuria and urgency.  Musculoskeletal: Positive for joint pain. Negative for back pain, falls, myalgias and neck pain.       Ankle  Skin: Negative for itching and rash.  Neurological: Negative for dizziness, tingling, tremors, sensory change, speech change, focal weakness, seizures, loss of consciousness, weakness and headaches.  Endo/Heme/Allergies: Negative for environmental allergies and polydipsia. Does not bruise/bleed easily.  Psychiatric/Behavioral: Negative for depression, hallucinations, memory loss, substance abuse and suicidal ideas. The patient is not nervous/anxious and does not have insomnia.       Physical Exam: BP 134/72   Pulse (!) 58   Temp (!) 97.3 F (36.3 C)   Ht 5\' 2"  (1.575 m)   Wt 161 lb (73 kg)   SpO2 98%   BMI 29.45 kg/m  Wt Readings from Last 3  Encounters:  09/03/19 161 lb (73 kg)  05/31/19 159 lb 6.4 oz (72.3 kg)  02/26/19 157 lb 6.4 oz (71.4 kg)   General Appearance: Well nourished, in no apparent distress. Eyes: PERRLA, EOMs, conjunctiva no swelling or erythema Sinuses: No Frontal/maxillary tenderness ENT/Mouth: Ext aud canals clear, TMs without erythema, bulging. No erythema, swelling, or exudate on post pharynx.  Tonsils not swollen or erythematous. Hearing normal.  Neck: Supple, thyroid normal.  Respiratory: Respiratory effort normal, BS equal bilaterally without rales, rhonchi, wheezing or stridor.  Cardio: RRR with no MRGs. Brisk peripheral pulses without edema.  Abdomen: Soft, + BS.  Non tender, no guarding, rebound, hernias, masses. Lymphatics: Non tender without lymphadenopathy.  Musculoskeletal: Full ROM, 5/5 strength, Normal gait Skin: Warm,  dry without rashes, lesions, ecchymosis.  Neuro: Cranial nerves intact. No cerebellar symptoms.  Psych: Awake and oriented X 3, normal affect, Insight and Judgment appropriate.    Garnet Sierras, NP [facility] 12:30 PM

## 2019-09-02 NOTE — Patient Instructions (Addendum)
We will contact you in 1-3 days with your lab results via [facility].       Vit D  & Vit C 1,000 mg   are recommended to help protect  against the Covid-19 and other Corona viruses.    Also it's recommended  to take  Zinc 50 mg  to help  protect against the Covid-19   and best place to get  is also on [email or web address]  and don't pay more than 6-8 cents /pill !  ================================ Coronavirus (COVID-19) Are you at risk?  Are you at risk for the Coronavirus (COVID-19)?  To be considered HIGH RISK for Coronavirus (COVID-19), you have to meet the following criteria:  . Traveled to [place], [place], [place], [place] or [place]; or in the [place] to [place], [place], [place]  . or [place]; and have fever, cough, and shortness of breath within the last 2 weeks of travel OR . Been in close contact with a person diagnosed with COVID-19 within the last 2 weeks and have  . fever, cough,and shortness of breath .  . IF YOU DO NOT MEET THESE CRITERIA, YOU ARE CONSIDERED LOW RISK FOR COVID-19.  What to do if you are HIGH RISK for COVID-19?  Marland Kitchen If you are having a medical emergency, call 911. . Seek medical care right away. Before you go to a doctor's office, urgent care or emergency department, .  call ahead and tell them about your recent travel, contact with someone diagnosed with COVID-19  .  and your symptoms.  . You should receive instructions from your physician's office regarding next steps of care.  . When you arrive at healthcare provider, tell the healthcare staff immediately you have returned from  . visiting [place], [place], [place], [place] or [place]; or traveled in the [place] to [place], [place],  . [place] or [place] in the last two weeks or you have been in close contact with a person diagnosed with  . COVID-19 in the last 2 weeks.   . Tell the health care staff about your symptoms: fever, cough and shortness of breath. . After you have been  seen by a medical provider, you will be either: o Tested for (COVID-19) and discharged home on quarantine except to seek medical care if  o symptoms worsen, and asked to  - Stay home and avoid contact with others until you get your results (4-5 days)  - Avoid travel on public transportation if possible (such as bus, train, or airplane) or o Sent to the Emergency Department by EMS for evaluation, COVID-19 testing  and  o possible admission depending on your condition and test results.  What to do if you are LOW RISK for COVID-19?  Reduce your risk of any infection by using the same precautions used for avoiding the common cold or flu:  Marland Kitchen Wash your hands often with soap and warm water for at least 20 seconds.  If soap and water are not readily available,  . use an alcohol-based hand sanitizer with at least 60% alcohol.  . If coughing or sneezing, cover your mouth and nose by coughing or sneezing into the elbow areas of your shirt or coat, .  into a tissue or into your sleeve (not your hands). . Avoid shaking hands with others and consider head nods or verbal greetings only. . Avoid touching your eyes, nose, or mouth with unwashed hands.  . Avoid close contact with people who are sick. Marland Kitchen  Avoid places or events with large numbers of people in one location, like concerts or sporting events. . Carefully consider travel plans you have or are making. . If you are planning any travel outside or inside the [place], visit the CDC's Travelers' Health webpage for the latest health notices. . If you have some symptoms but not all symptoms, continue to monitor at home and seek medical attention  . if your symptoms worsen. . If you are having a medical emergency, call 911.   . >>>>>>>>>>>>>>>>>>>>>>>>>>>>>>>>> . We Do NOT Approve of  [facility], [employer]  Our Patients  To Do Home Visits & We Do NOT Approve of [employer] > > > > > > > > > > > > > > > > > > > > > > > > > > > >  > > > > > > > > > > >  Preventive Care for Adults  A healthy lifestyle and preventive care can promote health and wellness. Preventive health guidelines for women include the following key practices.  A routine yearly physical is a good way to check with your health care provider about your health and preventive screening. It is a chance to share any concerns and updates on your health and to receive a thorough exam.  Visit your dentist for a routine exam and preventive care every 6 months. Brush your teeth twice a day and floss once a day. Good oral hygiene prevents tooth decay and gum disease.  The frequency of eye exams is based on your age, health, family medical history, use of contact lenses, and other factors. Follow your health care provider's recommendations for frequency of eye exams.  Eat a healthy diet. Foods like vegetables, fruits, whole grains, low-fat dairy products, and lean protein foods contain the nutrients you need without too many calories. Decrease your intake of foods high in solid fats, added sugars, and salt. Eat the right amount of calories for you. Get information about a proper diet from your health care provider, if necessary.  Regular physical exercise is one of the most important things you can do for your health. Most adults should get at least 150 minutes of moderate-intensity exercise (any activity that increases your heart rate and causes you to sweat) each week. In addition, most adults need muscle-strengthening exercises on 2 or more days a week.  Maintain a healthy weight. The body mass index (BMI) is a screening tool to identify possible weight problems. It provides an estimate of body fat based on height and weight. Your health care provider can find your BMI and can help you achieve or maintain a healthy weight. For adults 20 years and older:  A BMI below 18.5 is considered underweight.  A BMI of 18.5 to 24.9 is normal.  A BMI of 25 to 29.9 is  considered overweight.  A BMI of 30 and above is considered obese.  Maintain normal blood lipids and cholesterol levels by exercising and minimizing your intake of saturated fat. Eat a balanced diet with plenty of fruit and vegetables. If your lipid or cholesterol levels are high, you are over 50, or you are at high risk for heart disease, you may need your cholesterol levels checked more frequently. Ongoing high lipid and cholesterol levels should be treated with medicines if diet and exercise are not working.  If you smoke, find out from your health care provider how to quit. If you do not use tobacco, do not start.  Lung  cancer screening is recommended for adults aged 30-80 years who are at high risk for developing lung cancer because of a history of smoking. A yearly low-dose CT scan of the lungs is recommended for people who have at least a 30-pack-year history of smoking and are a current smoker or have quit within the past 15 years. A pack year of smoking is smoking an average of 1 pack of cigarettes a day for 1 year (for example: 1 pack a day for 30 years or 2 packs a day for 15 years). Yearly screening should continue until the smoker has stopped smoking for at least 15 years. Yearly screening should be stopped for people who develop a health problem that would prevent them from having lung cancer treatment.  Avoid use of street drugs. Do not share needles with anyone. Ask for help if you need support or instructions about stopping the use of drugs.  High blood pressure causes heart disease and increases the risk of stroke.  Ongoing high blood pressure should be treated with medicines if weight loss and exercise do not work.  If you are 81-20 years old, ask your health care provider if you should take aspirin to prevent strokes.  Diabetes screening involves taking a blood sample to check your fasting blood sugar level. This should be done once every 3 years, after age 105, if you are within  normal weight and without risk factors for diabetes. Testing should be considered at a younger age or be carried out more frequently if you are overweight and have at least 1 risk factor for diabetes.  Breast cancer screening is essential preventive care for women. You should practice "breast self-awareness." This means understanding the normal appearance and feel of your breasts and may include breast self-examination. Any changes detected, no matter how small, should be reported to a health care provider. Women in their 42s and 30s should have a clinical breast exam (CBE) by a health care provider as part of a regular health exam every 1 to 3 years. After age 70, women should have a CBE every year. Starting at age 78, women should consider having a mammogram (breast X-ray test) every year. Women who have a family history of breast cancer should talk to their health care provider about genetic screening. Women at a high risk of breast cancer should talk to their health care providers about having an MRI and a mammogram every year.  Breast cancer gene (BRCA)-related cancer risk assessment is recommended for women who have family members with BRCA-related cancers. BRCA-related cancers include breast, ovarian, tubal, and peritoneal cancers. Having family members with these cancers may be associated with an increased risk for harmful changes (mutations) in the breast cancer genes BRCA1 and BRCA2. Results of the assessment will determine the need for genetic counseling and BRCA1 and BRCA2 testing.  Routine pelvic exams to screen for cancer are no longer recommended for nonpregnant women who are considered low risk for cancer of the pelvic organs (ovaries, uterus, and vagina) and who do not have symptoms. Ask your health care provider if a screening pelvic exam is right for you.  If you have had past treatment for cervical cancer or a condition that could lead to cancer, you need Pap tests and screening for  cancer for at least 20 years after your treatment. If Pap tests have been discontinued, your risk factors (such as having a new sexual partner) need to be reassessed to determine if screening should be resumed. Some  women have medical problems that increase the chance of getting cervical cancer. In these cases, your health care provider may recommend more frequent screening and Pap tests.    Colorectal cancer can be detected and often prevented. Most routine colorectal cancer screening begins at the age of 15 years and continues through age 16 years. However, your health care provider may recommend screening at an earlier age if you have risk factors for colon cancer. On a yearly basis, your health care provider may provide home test kits to check for hidden blood in the stool. Use of a small camera at the end of a tube, to directly examine the colon (sigmoidoscopy or colonoscopy), can detect the earliest forms of colorectal cancer. Talk to your health care provider about this at age 5, when routine screening begins.  Direct exam of the colon should be repeated every 5-10 years through age 8 years, unless early forms of pre-cancerous polyps or small growths are found.  Osteoporosis is a disease in which the bones lose minerals and strength with aging. This can result in serious bone fractures or breaks. The risk of osteoporosis can be identified using a bone density scan. Women ages 44 years and over and women at risk for fractures or osteoporosis should discuss screening with their health care providers. Ask your health care provider whether you should take a calcium supplement or vitamin D to reduce the rate of osteoporosis.  Menopause can be associated with physical symptoms and risks. Hormone replacement therapy is available to decrease symptoms and risks. You should talk to your health care provider about whether hormone replacement therapy is right for you.  Use sunscreen. Apply sunscreen liberally  and repeatedly throughout the day. You should seek shade when your shadow is shorter than you. Protect yourself by wearing long sleeves, pants, a wide-brimmed hat, and sunglasses year round, whenever you are outdoors.  Once a month, do a whole body skin exam, using a mirror to look at the skin on your back. Tell your health care provider of new moles, moles that have irregular borders, moles that are larger than a pencil eraser, or moles that have changed in shape or color.  Stay current with required vaccines (immunizations).  Influenza vaccine. All adults should be immunized every year.  Tetanus, diphtheria, and acellular pertussis (Td, Tdap) vaccine. Pregnant women should receive 1 dose of Tdap vaccine during each pregnancy. The dose should be obtained regardless of the length of time since the last dose. Immunization is preferred during the 27th-36th week of gestation. An adult who has not previously received Tdap or who does not know her vaccine status should receive 1 dose of Tdap. This initial dose should be followed by tetanus and diphtheria toxoids (Td) booster doses every 10 years. Adults with an unknown or incomplete history of completing a 3-dose immunization series with Td-containing vaccines should begin or complete a primary immunization series including a Tdap dose. Adults should receive a Td booster every 10 years.    Zoster vaccine. One dose is recommended for adults aged 55 years or older unless certain conditions are present.    Pneumococcal 13-valent conjugate (PCV13) vaccine. When indicated, a person who is uncertain of her immunization history and has no record of immunization should receive the PCV13 vaccine. An adult aged 73 years or older who has certain medical conditions and has not been previously immunized should receive 1 dose of PCV13 vaccine. This PCV13 should be followed with a dose of pneumococcal polysaccharide (PPSV23)  vaccine. The PPSV23 vaccine dose should be  obtained at least 1 or more year(s) after the dose of PCV13 vaccine. An adult aged 52 years or older who has certain medical conditions and previously received 1 or more doses of PPSV23 vaccine should receive 1 dose of PCV13. The PCV13 vaccine dose should be obtained 1 or more years after the last PPSV23 vaccine dose.    Pneumococcal polysaccharide (PPSV23) vaccine. When PCV13 is also indicated, PCV13 should be obtained first. All adults aged 69 years and older should be immunized. An adult younger than age 55 years who has certain medical conditions should be immunized. Any person who resides in a nursing home or long-term care facility should be immunized. An adult smoker should be immunized. People with an immunocompromised condition and certain other conditions should receive both PCV13 and PPSV23 vaccines. People with human immunodeficiency virus (HIV) infection should be immunized as soon as possible after diagnosis. Immunization during chemotherapy or radiation therapy should be avoided. Routine use of PPSV23 vaccine is not recommended for American Indians, [place]atives, or people younger than 65 years unless there are medical conditions that require PPSV23 vaccine. When indicated, people who have unknown immunization and have no record of immunization should receive PPSV23 vaccine. One-time revaccination 5 years after the first dose of PPSV23 is recommended for people aged 19-64 years who have chronic kidney failure, nephrotic syndrome, asplenia, or immunocompromised conditions. People who received 1-2 doses of PPSV23 before age 64 years should receive another dose of PPSV23 vaccine at age 78 years or later if at least 5 years have passed since the previous dose. Doses of PPSV23 are not needed for people immunized with PPSV23 at or after age 24 years.   Preventive Services / Frequency  Ages 23 years and over  Blood pressure check.  Lipid and cholesterol check.  Lung cancer screening. / Every  year if you are aged 47-80 years and have a 30-pack-year history of smoking and currently smoke or have quit within the past 15 years. Yearly screening is stopped once you have quit smoking for at least 15 years or develop a health problem that would prevent you from having lung cancer treatment.  Clinical breast exam.** / Every year after age 41 years.   BRCA-related cancer risk assessment.** / For women who have family members with a BRCA-related cancer (breast, ovarian, tubal, or peritoneal cancers).  Mammogram.** / Every year beginning at age 82 years and continuing for as long as you are in good health. Consult with your health care provider.  Pap test.** / Every 3 years starting at age 18 years through age 96 or 80 years with 3 consecutive normal Pap tests. Testing can be stopped between 65 and 70 years with 3 consecutive normal Pap tests and no abnormal Pap or HPV tests in the past 10 years.  Fecal occult blood test (FOBT) of stool. / Every year beginning at age 61 years and continuing until age 66 years. You may not need to do this test if you get a colonoscopy every 10 years.  Flexible sigmoidoscopy or colonoscopy.** / Every 5 years for a flexible sigmoidoscopy or every 10 years for a colonoscopy beginning at age 38 years and continuing until age 84 years.  Hepatitis C blood test.** / For all people born from [birth year] through 1964 and any individual with known risks for hepatitis C.  Osteoporosis screening.** / A one-time screening for women ages 74 years and over and women at risk for fractures  or osteoporosis.  Skin self-exam. / Monthly.  Influenza vaccine. / Every year.  Tetanus, diphtheria, and acellular pertussis (Tdap/Td) vaccine.** / 1 dose of Td every 10 years.  Zoster vaccine.** / 1 dose for adults aged 60 years or older.  Pneumococcal 13-valent conjugate (PCV13) vaccine.** / Consult your health care provider.  Pneumococcal polysaccharide (PPSV23) vaccine.** / 1 dose for all  adults aged 71 years and older. Screening for abdominal aortic aneurysm (AAA)  by ultrasound is recommended for people who have history of high blood pressure or who are current or former smokers. ++++++++++++++++++++ Recommend Adult Low Dose Aspirin or  coated  Aspirin 81 mg daily  To reduce risk of Colon Cancer 40 %,  Skin Cancer 26 % ,  Melanoma 46%  and  Pancreatic cancer 60% ++++++++++++++++++++ Vitamin D goal  is between 70-100.  Please make sure that you are taking your Vitamin D as directed.  It is very important as a natural anti-inflammatory  helping hair, skin, and nails, as well as reducing stroke and heart attack risk.  It helps your bones and helps with mood. It also decreases numerous cancer risks so please take it as directed.  Low Vit D is associated with a 200-300% higher risk for CANCER  and 200-300% higher risk for HEART   ATTACK  &  STROKE.   .....................................Marland Kitchen It is also associated with higher death rate at younger ages,  autoimmune diseases like Rheumatoid arthritis, Lupus, Multiple Sclerosis.    Also many other serious conditions, like depression, Alzheimer's Dementia, infertility, muscle aches, fatigue, fibromyalgia - just to name a few. ++++++++++++++++++ Recommend the book "The END of DIETING" by Dr Excell Seltzer  & the book "The END of DIABETES " by Dr Excell Seltzer At [facility] - get book & Audio CD's    Being diabetic has a  300% increased risk for heart attack, stroke, cancer, and alzheimer- type vascular dementia. It is very important that you work harder with diet by avoiding all foods that are white. Avoid white rice (brown & wild rice is OK), white potatoes (sweetpotatoes in moderation is OK), White bread or wheat bread or anything made out of white flour like bagels, donuts, rolls, buns, biscuits, cakes, pastries, cookies, pizza crust, and pasta (made from white flour & egg whites) - vegetarian pasta or spinach or wheat pasta is OK.  Multigrain breads like Arnold's or [employer], or multigrain sandwich thins or flatbreads.  Diet, exercise and weight loss can reverse and cure diabetes in the early stages.  Diet, exercise and weight loss is very important in the control and prevention of complications of diabetes which affects every system in your body, ie. Brain - dementia/stroke, eyes - glaucoma/blindness, heart - heart attack/heart failure, kidneys - dialysis, stomach - gastric paralysis, intestines - malabsorption, nerves - severe painful neuritis, circulation - gangrene & loss of a leg(s), and finally cancer and Alzheimers.    I recommend avoid fried & greasy foods,  sweets/candy, white rice (brown or wild rice or Quinoa is OK), white potatoes (sweet potatoes are OK) - anything made from white flour - bagels, doughnuts, rolls, buns, biscuits,white and wheat breads, pizza crust and traditional pasta made of white flour & egg white(vegetarian pasta or spinach or wheat pasta is OK).  Multi-grain bread is OK - like multi-grain flat bread or sandwich thins. Avoid alcohol in excess. Exercise is also important.    Eat all the vegetables you want - avoid meat, especially red meat and dairy -  especially cheese.  Cheese is the most concentrated form of trans-fats which is the worst thing to clog up our arteries. Veggie cheese is OK which can be found in the fresh produce section at [employer] or [employer] or [employer]  +++++++++++++++++++ DASH Eating Plan  DASH stands for "Dietary Approaches to Stop Hypertension."   The DASH eating plan is a healthy eating plan that has been shown to reduce high blood pressure (hypertension). Additional health benefits may include reducing the risk of type 2 diabetes mellitus, heart disease, and stroke. The DASH eating plan may also help with weight loss. WHAT DO I NEED TO KNOW ABOUT THE DASH EATING PLAN? For the DASH eating plan, you will follow these general guidelines:  Choose foods with a  percent daily value for sodium of less than 5% (as listed on the food label).  Use salt-free seasonings or herbs instead of table salt or sea salt.  Check with your health care provider or pharmacist before using salt substitutes.  Eat lower-sodium products, often labeled as "lower sodium" or "no salt added."  Eat fresh foods.  Eat more vegetables, fruits, and low-fat dairy products.  Choose whole grains. Look for the word "whole" as the first word in the ingredient list.  Choose fish   Limit sweets, desserts, sugars, and sugary drinks.  Choose heart-healthy fats.  Eat veggie cheese   Eat more home-cooked food and less restaurant, buffet, and fast food.  Limit fried foods.  Cook foods using methods other than frying.  Limit canned vegetables. If you do use them, rinse them well to decrease the sodium.  When eating at a restaurant, ask that your food be prepared with less salt, or no salt if possible.                      WHAT FOODS CAN I EAT? Read Dr Fara Olden Fuhrman's books on The End of Dieting & The End of Diabetes  Grains Whole grain or whole wheat bread. Brown rice. Whole grain or whole wheat pasta. Quinoa, bulgur, and whole grain cereals. Low-sodium cereals. Corn or whole wheat flour tortillas. Whole grain cornbread. Whole grain crackers. Low-sodium crackers.  Vegetables Fresh or frozen vegetables (raw, steamed, roasted, or grilled). Low-sodium or reduced-sodium tomato and vegetable juices. Low-sodium or reduced-sodium tomato sauce and paste. Low-sodium or reduced-sodium canned vegetables.   Fruits All fresh, canned (in natural juice), or frozen fruits.  Protein Products  All fish and seafood.  Dried beans, peas, or lentils. Unsalted nuts and seeds. Unsalted canned beans.  Dairy Low-fat dairy products, such as skim or 1% milk, 2% or reduced-fat cheeses, low-fat ricotta or cottage cheese, or plain low-fat yogurt. Low-sodium or reduced-sodium cheeses.  Fats and  Oils Tub margarines without trans fats. Light or reduced-fat mayonnaise and salad dressings (reduced sodium). Avocado. Safflower, olive, or canola oils. Natural peanut or almond butter.  Other Unsalted popcorn and pretzels. The items listed above may not be a complete list of recommended foods or beverages. Contact your dietitian for more options.  +++++++++++++++  WHAT FOODS ARE NOT RECOMMENDED? Grains/ White flour or wheat flour White bread. White pasta. White rice. Refined cornbread. Bagels and croissants. Crackers that contain trans fat.  Vegetables  Creamed or fried vegetables. Vegetables in a . Regular canned vegetables. Regular canned tomato sauce and paste. Regular tomato and vegetable juices.  Fruits Dried fruits. Canned fruit in light or heavy syrup. Fruit juice.  Meat and Other Protein Products Meat in general -  RED meat & White meat.  Fatty cuts of meat. Ribs, chicken wings, all processed meats as bacon, sausage, bologna, salami, fatback, hot dogs, bratwurst and packaged luncheon meats.  Dairy Whole or 2% milk, cream, half-and-half, and cream cheese. Whole-fat or sweetened yogurt. Full-fat cheeses or blue cheese. Non-dairy creamers and whipped toppings. Processed cheese, cheese spreads, or cheese curds.  Condiments Onion and garlic salt, seasoned salt, table salt, and sea salt. Canned and packaged gravies. Worcestershire sauce. Tartar sauce. Barbecue sauce. Teriyaki sauce. Soy sauce, including reduced sodium. Steak sauce. Fish sauce. Oyster sauce. Cocktail sauce. Horseradish. Ketchup and mustard. Meat flavorings and tenderizers. Bouillon cubes. Hot sauce. Tabasco sauce. Marinades. Taco seasonings. Relishes.  Fats and Oils Butter, stick margarine, lard, shortening and bacon fat. Coconut, palm kernel, or palm oils. Regular salad dressings.  Pickles and olives. Salted popcorn and pretzels.  The items listed above may not be a complete list of foods and beverages to  avoid.

## 2019-09-03 ENCOUNTER — Encounter: Payer: Self-pay | Admitting: Adult Health Nurse Practitioner

## 2019-09-03 ENCOUNTER — Other Ambulatory Visit: Payer: Self-pay

## 2019-09-03 ENCOUNTER — Ambulatory Visit (INDEPENDENT_AMBULATORY_CARE_PROVIDER_SITE_OTHER): Payer: Medicare PPO | Admitting: Adult Health Nurse Practitioner

## 2019-09-03 VITALS — BP 134/72 | HR 58 | Temp 97.3°F | Ht 62.0 in | Wt 161.0 lb

## 2019-09-03 DIAGNOSIS — Z1159 Encounter for screening for other viral diseases: Secondary | ICD-10-CM

## 2019-09-03 DIAGNOSIS — E663 Overweight: Secondary | ICD-10-CM

## 2019-09-03 DIAGNOSIS — R7309 Other abnormal glucose: Secondary | ICD-10-CM | POA: Diagnosis not present

## 2019-09-03 DIAGNOSIS — M810 Age-related osteoporosis without current pathological fracture: Secondary | ICD-10-CM | POA: Diagnosis not present

## 2019-09-03 DIAGNOSIS — E559 Vitamin D deficiency, unspecified: Secondary | ICD-10-CM | POA: Diagnosis not present

## 2019-09-03 DIAGNOSIS — E039 Hypothyroidism, unspecified: Secondary | ICD-10-CM | POA: Diagnosis not present

## 2019-09-03 DIAGNOSIS — I10 Essential (primary) hypertension: Secondary | ICD-10-CM | POA: Diagnosis not present

## 2019-09-03 DIAGNOSIS — K219 Gastro-esophageal reflux disease without esophagitis: Secondary | ICD-10-CM

## 2019-09-03 DIAGNOSIS — G4700 Insomnia, unspecified: Secondary | ICD-10-CM

## 2019-09-03 DIAGNOSIS — M069 Rheumatoid arthritis, unspecified: Secondary | ICD-10-CM | POA: Diagnosis not present

## 2019-09-03 DIAGNOSIS — Z79899 Other long term (current) drug therapy: Secondary | ICD-10-CM

## 2019-09-03 DIAGNOSIS — E782 Mixed hyperlipidemia: Secondary | ICD-10-CM

## 2019-09-03 DIAGNOSIS — G629 Polyneuropathy, unspecified: Secondary | ICD-10-CM

## 2019-09-03 MED ORDER — LOSARTAN POTASSIUM 25 MG PO TABS: 25.0000 mg | ORAL_TABLET | Freq: Every day | ORAL | 2 refills | Status: DC

## 2019-09-04 LAB — CBC WITH DIFFERENTIAL/PLATELET
Absolute Monocytes: 376 cells/uL (ref 200–950)
Basophils Absolute: 61 cells/uL (ref 0–200)
Basophils Relative: 1.3 %
Eosinophils Absolute: 287 cells/uL (ref 15–500)
Eosinophils Relative: 6.1 %
HCT: 38.8 % (ref 35.0–45.0)
Hemoglobin: 12.7 g/dL (ref 11.7–15.5)
Lymphs Abs: 1072 cells/uL (ref 850–3900)
MCH: 29.3 pg (ref 27.0–33.0)
MCHC: 32.7 g/dL (ref 32.0–36.0)
MCV: 89.6 fL (ref 80.0–100.0)
MPV: 13.4 fL — ABNORMAL HIGH (ref 7.5–12.5)
Monocytes Relative: 8 %
Neutro Abs: 2905 cells/uL (ref 1500–7800)
Neutrophils Relative %: 61.8 %
Platelets: 153 10*3/uL (ref 140–400)
RBC: 4.33 10*6/uL (ref 3.80–5.10)
RDW: 15.3 % — ABNORMAL HIGH (ref 11.0–15.0)
Total Lymphocyte: 22.8 %
WBC: 4.7 10*3/uL (ref 3.8–10.8)

## 2019-09-04 LAB — COMPLETE METABOLIC PANEL WITH GFR
AG Ratio: 2.3 (calc) (ref 1.0–2.5)
ALT: 20 U/L (ref 6–29)
AST: 28 U/L (ref 10–35)
Albumin: 4.4 g/dL (ref 3.6–5.1)
Alkaline phosphatase (APISO): 77 U/L (ref 37–153)
BUN/Creatinine Ratio: 7 (calc) (ref 6–22)
BUN: 6 mg/dL — ABNORMAL LOW (ref 7–25)
CO2: 31 mmol/L (ref 20–32)
Calcium: 10.4 mg/dL (ref 8.6–10.4)
Chloride: 106 mmol/L (ref 98–110)
Creat: 0.84 mg/dL (ref 0.60–0.93)
GFR, Est African American: 80 mL/min/{1.73_m2} (ref >=60)
GFR, Est Non African American: 69 mL/min/{1.73_m2} (ref >=60)
Globulin: 1.9 g/dL (calc) (ref 1.9–3.7)
Glucose, Bld: 95 mg/dL (ref 65–99)
Potassium: 4.5 mmol/L (ref 3.5–5.3)
Sodium: 141 mmol/L (ref 135–146)
Total Bilirubin: 0.4 mg/dL (ref 0.2–1.2)
Total Protein: 6.3 g/dL (ref 6.1–8.1)

## 2019-09-04 LAB — LIPID PANEL
Cholesterol: 210 mg/dL — ABNORMAL HIGH (ref <200)
HDL: 73 mg/dL (ref >=50)
LDL Cholesterol (Calc): 119 mg/dL (calc) — ABNORMAL HIGH
Non-HDL Cholesterol (Calc): 137 mg/dL (calc) — ABNORMAL HIGH (ref <130)
Total CHOL/HDL Ratio: 2.9 (calc) (ref <5.0)
Triglycerides: 79 mg/dL (ref <150)

## 2019-09-04 LAB — TSH: TSH: 1.08 mIU/L (ref 0.40–4.50)

## 2019-09-04 LAB — MAGNESIUM: Magnesium: 2.1 mg/dL (ref 1.5–2.5)

## 2019-09-04 LAB — HEPATITIS C ANTIBODY
Hepatitis C Ab: NONREACTIVE
SIGNAL TO CUT-OFF: 0.01 (ref <1.00)

## 2019-09-12 ENCOUNTER — Other Ambulatory Visit: Payer: Self-pay

## 2019-09-12 ENCOUNTER — Encounter: Payer: Self-pay | Admitting: Orthopaedic Surgery

## 2019-09-12 ENCOUNTER — Ambulatory Visit: Payer: Medicare PPO | Admitting: Orthopaedic Surgery

## 2019-09-12 DIAGNOSIS — M25571 Pain in right ankle and joints of right foot: Secondary | ICD-10-CM

## 2019-09-12 NOTE — Progress Notes (Signed)
The patient comes in with continued right ankle pain.  She points around the medial malleolus and anterior and posterior this is a source of her pain.  She is 73 years old.  I did try an intra-articular injection as well as activity modification.  She has been on anti-inflammatories.  She has rested the ankle.  It is gets worse as the day goes along in terms of her pain.  She does not wake up in pain and when she first gets up it is fine.  She has a history of a calcaneus stress fracture that was diagnosed over a year ago and she had to be immobilized for that.  On exam her range of motion is still entirely full of her right ankle and feels ligamentously stable.  There is no significant swelling.  Her foot is well-perfused.  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  There is pain to palpation just anterior and posterior to the medial malleolus but also on the malleolus itself.  I would like to try a short cam walking boot with weightbearing as tolerated in the boot.  I will see her back in 3 weeks to see how she is done.  If she continues to have the same pain a MRI of her right ankle would be warranted.

## 2019-09-13 DIAGNOSIS — M2550 Pain in unspecified joint: Secondary | ICD-10-CM | POA: Diagnosis not present

## 2019-09-13 DIAGNOSIS — M0609 Rheumatoid arthritis without rheumatoid factor, multiple sites: Secondary | ICD-10-CM | POA: Diagnosis not present

## 2019-09-13 DIAGNOSIS — Z79899 Other long term (current) drug therapy: Secondary | ICD-10-CM | POA: Diagnosis not present

## 2019-09-13 DIAGNOSIS — M150 Primary generalized (osteo)arthritis: Secondary | ICD-10-CM | POA: Diagnosis not present

## 2019-09-24 DIAGNOSIS — Z79899 Other long term (current) drug therapy: Secondary | ICD-10-CM | POA: Diagnosis not present

## 2019-10-03 ENCOUNTER — Ambulatory Visit: Payer: Medicare PPO | Admitting: Orthopaedic Surgery

## 2019-11-02 ENCOUNTER — Other Ambulatory Visit: Payer: Self-pay | Admitting: Internal Medicine

## 2019-11-02 ENCOUNTER — Other Ambulatory Visit: Payer: Self-pay | Admitting: Physician Assistant

## 2019-11-02 DIAGNOSIS — E039 Hypothyroidism, unspecified: Secondary | ICD-10-CM

## 2019-11-02 DIAGNOSIS — G629 Polyneuropathy, unspecified: Secondary | ICD-10-CM

## 2019-11-05 ENCOUNTER — Telehealth: Payer: Self-pay | Admitting: Physician Assistant

## 2019-11-05 NOTE — Telephone Encounter (Signed)
Sent in

## 2019-11-06 ENCOUNTER — Ambulatory Visit: Payer: Medicare PPO | Admitting: Podiatry

## 2019-11-06 ENCOUNTER — Other Ambulatory Visit: Payer: Self-pay | Admitting: Podiatry

## 2019-11-06 ENCOUNTER — Other Ambulatory Visit: Payer: Self-pay

## 2019-11-06 ENCOUNTER — Telehealth: Payer: Self-pay | Admitting: Physician Assistant

## 2019-11-06 ENCOUNTER — Ambulatory Visit (INDEPENDENT_AMBULATORY_CARE_PROVIDER_SITE_OTHER): Payer: Medicare PPO

## 2019-11-06 DIAGNOSIS — S93491A Sprain of other ligament of right ankle, initial encounter: Secondary | ICD-10-CM | POA: Diagnosis not present

## 2019-11-06 DIAGNOSIS — S93401A Sprain of unspecified ligament of right ankle, initial encounter: Secondary | ICD-10-CM

## 2019-11-06 DIAGNOSIS — IMO0001 Reserved for inherently not codable concepts without codable children: Secondary | ICD-10-CM

## 2019-11-06 DIAGNOSIS — M25471 Effusion, right ankle: Secondary | ICD-10-CM

## 2019-11-06 DIAGNOSIS — M25571 Pain in right ankle and joints of right foot: Secondary | ICD-10-CM

## 2019-11-06 DIAGNOSIS — F411 Generalized anxiety disorder: Secondary | ICD-10-CM

## 2019-11-06 DIAGNOSIS — G8929 Other chronic pain: Secondary | ICD-10-CM

## 2019-11-06 MED ORDER — ALPRAZOLAM 1 MG PO TABS: ORAL_TABLET | ORAL | 0 refills | Status: DC

## 2019-11-06 NOTE — Patient Instructions (Signed)
Ankle Sprain, Phase I Rehab An ankle sprain is an injury to the ligaments of your ankle. Ankle sprains cause stiffness, loss of motion, and loss of strength. Ask your health care provider which exercises are safe for you. Do exercises exactly as told by your health care provider and adjust them as directed. It is normal to feel mild stretching, pulling, tightness, or discomfort as you do these exercises. Stop right away if you feel sudden pain or your pain gets worse. Do not begin these exercises until told by your health care provider. Stretching and range-of-motion exercises These exercises warm up your muscles and joints and improve the movement and flexibility of your lower leg and ankle. These exercises also help to relieve pain and stiffness. Gastroc and soleus stretch This exercise is also called a calf stretch. It stretches the muscles in the back of the lower leg. These muscles are the gastrocnemius, or gastroc, and the soleus. 1. Sit on the floor with your left / right leg extended. 2. Loop a belt or towel around the ball of your left / right foot. The ball of your foot is on the walking surface, right under your toes. 3. Keep your left / right ankle and foot relaxed and keep your knee straight while you use the belt or towel to pull your foot toward you. You should feel a gentle stretch behind your calf or knee in your gastroc muscle. 4. Hold this position for __________ seconds, then release to the starting position. 5. Repeat the exercise with your knee bent. You can put a pillow or a rolled bath towel under your knee to support it. You should feel a stretch deep in your calf in the soleus muscle or at your Achilles tendon. Repeat __________ times. Complete this exercise __________ times a day. Ankle alphabet  1. Sit with your left / right leg supported at the lower leg. ? Do not rest your foot on anything. ? Make sure your foot has room to move freely. 2. Think of your left / right  foot as a paintbrush. ? Move your foot to trace each letter of the alphabet in the air. Keep your hip and knee still while you trace. ? Make the letters as large as you can without feeling discomfort. 3. Trace every letter from A to Z. Repeat __________ times. Complete this exercise __________ times a day. Strengthening exercises These exercises build strength and endurance in your ankle and lower leg. Endurance is the ability to use your muscles for a long time, even after they get tired. Ankle dorsiflexion  1. Secure a rubber exercise band or tube to an object, such as a table leg, that will stay still when the band is pulled. Secure the other end around your left / right foot. 2. Sit on the floor facing the object, with your left / right leg extended. The band or tube should be slightly tense when your foot is relaxed. 3. Slowly bring your foot toward you, bringing the top of your foot toward your shin (dorsiflexion), and pulling the band tighter. 4. Hold this position for __________ seconds. 5. Slowly return your foot to the starting position. Repeat __________ times. Complete this exercise __________ times a day. Ankle plantar flexion  1. Sit on the floor with your left / right leg extended. 2. Loop a rubber exercise tube or band around the ball of your left / right foot. The ball of your foot is on the walking surface, right under your   toes. ? Hold the ends of the band or tube in your hands. ? The band or tube should be slightly tense when your foot is relaxed. 3. Slowly point your foot and toes downward to tilt the top of your foot away from your shin (plantar flexion). 4. Hold this position for __________ seconds. 5. Slowly return your foot to the starting position. Repeat __________ times. Complete this exercise __________ times a day. Ankle eversion 1. Sit on the floor with your legs straight out in front of you. 2. Loop a rubber exercise band or tube around the ball of your left  / right foot. The ball of your foot is on the walking surface, right under your toes. ? Hold the ends of the band in your hands, or secure the band to a stable object. ? The band or tube should be slightly tense when your foot is relaxed. 3. Slowly push your foot outward, away from your other leg (eversion). 4. Hold this position for __________ seconds. 5. Slowly return your foot to the starting position. Repeat __________ times. Complete this exercise __________ times a day. This information is not intended to replace advice given to you by your health care provider. Make sure you discuss any questions you have with your health care provider. Document Released: 05/26/2005 Document Revised: 02/13/2019 Document Reviewed: 08/07/2018 Elsevier Patient Education  2020 [employer].

## 2019-11-06 NOTE — Telephone Encounter (Signed)
-----   Message from Elenor Quinones, [place] sent at 11/05/2019 10:20 AM EST ----- Regarding: med refill Per pt/Yellow note:   Refill on [facility]ANA[facility] Please & Thank You!   FYI: pharmacy:  [facility]

## 2019-11-08 NOTE — Progress Notes (Signed)
Subjective:   Patient ID: [name], female   DOB: 73 y.o.   MRN: [id number]   HPI 73 year old female presents the office with concerns of right ankle pain which is been ongoing for the last 1 to 2 months.  Earlier this year she was seen for plantar fasciitis and stress fracture in her right heel.  She was doing well for several months but she states her right ankle started to hurt.  She was originally seen by Dr. Elvina Mattes for this.  She then followed up with Dr. Ninfa Linden.  She was placed into a boot.  After discussion with her about 1.5 months ago she did have a fall actually passed out.  After the accident the ankle pain started to worsen.  She said no recent treatment otherwise.  No other concerns.   Review of Systems  All other systems reviewed and are negative.  Past Medical History:  Diagnosis Date  . Anemia    Hx of   . Arthritis   . Calculus of bile duct without mention of cholecystitis or obstruction   . Cataract   . GERD (gastroesophageal reflux disease)   . H. pylori infection    Hx of   . Heart murmur   . Hyperlipidemia   . Hypertension   . Hypothyroid   . PUD (peptic ulcer disease)   . Rheumatoid arthritis(714.0)     Past Surgical History:  Procedure Laterality Date  . AV FISTULA REPAIR    . BLADDER SUSPENSION    . BREAST CYST EXCISION Left   . BUNIONECTOMY    . CARPAL TUNNEL RELEASE    . CHOLECYSTECTOMY  09/07/2011  . CYSTECTOMY     left breast  . ERCP  09/29/2011   Procedure: ENDOSCOPIC RETROGRADE CHOLANGIOPANCREATOGRAPHY (ERCP);  Surgeon: Inda Castle, MD;  Location: [facility];  Service: Endoscopy;  Laterality: N/A;  . PILONIDAL CYST EXCISION    . TENOLYSIS  10/26/2011   Procedure: TENDON SHEATH RELEASE/TENOLYSIS;  Surgeon: Wynonia Sours, MD;  Location: [facility];  Service: Orthopedics;  Laterality: Right;  tenosynovectomy removal superficialis slip right index finger  . TONSILLECTOMY    . TOTAL SHOULDER REPLACEMENT Right  02/22/2014   Dr. Enrigue Catena at [facility]  . TRIGGER FINGER RELEASE    . VAGINAL HYSTERECTOMY     ovaries not removed     Current Outpatient Medications:  .  ALPRAZolam (XANAX) 1 MG tablet, Take 1/2 to 1 tablet 1 to 2 x / day only if needed for Acute Anxiety Attack and please try to limit to 5 days /week to avoid addiction, Disp: 120 tablet, Rfl: 0 .  aspirin 81 MG tablet, Take 81 mg by mouth daily., Disp: , Rfl:  .  b complex vitamins capsule, Take 1 capsule by mouth daily., Disp: , Rfl:  .  BIOTIN PO, Take 1 tablet by mouth daily., Disp: , Rfl:  .  Cholecalciferol (VITAMIN D PO), Take 5,000 Units by mouth daily. , Disp: , Rfl:  .  folic acid (FOLVITE) 1 MG tablet, Take 3 mg by mouth daily. , Disp: , Rfl:  .  gabapentin (NEURONTIN) 100 MG capsule, Take 1 capsule 3 x /day for Chronic Pain, Disp: 270 capsule, Rfl: 3 .  GLUCOSAMINE-CHONDROITIN PO, Take 1 tablet by mouth daily., Disp: , Rfl:  .  hydroxychloroquine (PLAQUENIL) 200 MG tablet, Take by mouth daily. Take 1 tab AM and PM, Disp: , Rfl:  .  ibuprofen (ADVIL) 800 MG tablet, Take 1 tablet (  800 mg total) by mouth every 8 (eight) hours as needed for mild pain or moderate pain. PRN, Disp: 270 tablet, Rfl: 2 .  levothyroxine (SYNTHROID) 112 MCG tablet, Take 1 tablet daily on an empty stomach with only water for 30 minutes & no Antacid meds, Calcium or Magnesium for 4 hours & avoid Biotin, Disp: 90 tablet, Rfl: 3 .  losartan (COZAAR) 25 MG tablet, Take 1 tablet (25 mg total) by mouth daily., Disp: 90 tablet, Rfl: 2 .  Magnesium 500 MG TABS, Take 1 tablet by mouth daily., Disp: , Rfl:  .  MELATONIN PO, Take 1 tablet by mouth at bedtime., Disp: , Rfl:  .  methotrexate (RHEUMATREX) 2.5 MG tablet, Take 2.5 mg by mouth once a week. Patient injects 2.5 mg /ml 1 ml [place] weekly., Disp: , Rfl:  .  omeprazole (PRILOSEC) 40 MG capsule, TAKE 1 CAPSULE TWICE DAILY FOR ACID REFLUX, Disp: 180 capsule, Rfl: 3 .  OVER THE COUNTER MEDICATION, Takes hair, skin and  nails 1 time daily., Disp: , Rfl:  .  Probiotic CAPS, Take 1 capsule by mouth daily., Disp: , Rfl:   Allergies  Allergen Reactions  . Ace Inhibitors Cough  . Ambien [Zolpidem] Other (See Comments)    Odd Feeling  . Crestor [Rosuvastatin]   . Pravastatin   . Prozac [Fluoxetine Hcl] Other (See Comments)    Decreased libido  . Zoloft [Sertraline Hcl]          Objective:  Physical Exam  General: AAO x3, NAD  Dermatological: Skin is warm, dry and supple bilateral. Nails x 10 are well manicured; remaining integument appears unremarkable at this time. There are no open sores, no preulcerative lesions, no rash or signs of infection present.  Vascular: Dorsalis Pedis artery and Posterior Tibial artery pedal pulses are 2/4 bilateral with immedate capillary fill time. Pedal hair growth present. No varicosities and no lower extremity edema present bilateral. There is no pain with calf compression, swelling, warmth, erythema.   Neruologic: Grossly intact via light touch bilateral.  Patellar and Achilles deep tendon reflexes 2+ bilateral. No Babinski or clonus noted bilateral.   Musculoskeletal: There is tenderness palpation of the anterior lateral ankle along the ATFL.  There does appear to be an increase in anterior drawer compared to the contralateral extremity.  Talar tilt test was negative.  No pain on the fibula, tibia, fifth metatarsal base or other areas of the foot.  Achilles tendon appears to be intact.  No significant discomfort of the plantar fascia.  No pain with lateral compression of calcaneus.  Muscular strength 5/5 in all groups tested bilateral.  Gait: Unassisted, Nonantalgic.      Assessment:   73 year old female right chronic ankle sprain    Plan:  -Treatment options discussed including all alternatives, risks, and complications -Etiology of symptoms were discussed -X-rays were obtained and reviewed with the patient.  No evidence of acute fracture identified today.   There is a sclerotic bone of the calcaneus likely from old stress fracture. -I do think the ankle pain is related possibly to the fall that she had.  Unfortunately she resulted in an ankle sprain.  For now discussed immobilization in the ankle brace.  We discussed stretching, rehab exercises.  Discussed physical therapy but she wants to start on her own at home.  Ice, heat contrast.  If no improvement will order new MRI.  Return in about 4 weeks (around 12/04/2019).  Trula Slade DPM

## 2019-11-21 ENCOUNTER — Ambulatory Visit
Admission: RE | Admit: 2019-11-21 | Discharge: 2019-11-21 | Disposition: A | Discharge status: Home / Self Care | Payer: Medicare PPO | Source: Ambulatory Visit | Attending: Physician Assistant | Admitting: Physician Assistant

## 2019-11-21 ENCOUNTER — Encounter: Payer: Self-pay | Admitting: Physician Assistant

## 2019-11-21 ENCOUNTER — Ambulatory Visit: Payer: Medicare PPO | Admitting: Physician Assistant

## 2019-11-21 ENCOUNTER — Other Ambulatory Visit: Payer: Self-pay

## 2019-11-21 VITALS — BP 130/72 | HR 76 | Temp 97.6°F | Wt 167.6 lb

## 2019-11-21 DIAGNOSIS — E663 Overweight: Secondary | ICD-10-CM

## 2019-11-21 DIAGNOSIS — R55 Syncope and collapse: Secondary | ICD-10-CM

## 2019-11-21 DIAGNOSIS — M069 Rheumatoid arthritis, unspecified: Secondary | ICD-10-CM | POA: Diagnosis not present

## 2019-11-21 DIAGNOSIS — E611 Iron deficiency: Secondary | ICD-10-CM | POA: Diagnosis not present

## 2019-11-21 DIAGNOSIS — Z0001 Encounter for general adult medical examination with abnormal findings: Secondary | ICD-10-CM | POA: Diagnosis not present

## 2019-11-21 DIAGNOSIS — R6889 Other general symptoms and signs: Secondary | ICD-10-CM

## 2019-11-21 DIAGNOSIS — E538 Deficiency of other specified B group vitamins: Secondary | ICD-10-CM | POA: Diagnosis not present

## 2019-11-21 DIAGNOSIS — Z0000 Encounter for general adult medical examination without abnormal findings: Secondary | ICD-10-CM

## 2019-11-21 DIAGNOSIS — Z79899 Other long term (current) drug therapy: Secondary | ICD-10-CM

## 2019-11-21 DIAGNOSIS — E039 Hypothyroidism, unspecified: Secondary | ICD-10-CM

## 2019-11-21 DIAGNOSIS — R7309 Other abnormal glucose: Secondary | ICD-10-CM | POA: Diagnosis not present

## 2019-11-21 DIAGNOSIS — G629 Polyneuropathy, unspecified: Secondary | ICD-10-CM

## 2019-11-21 DIAGNOSIS — Z860100 Personal history of colon polyps, unspecified: Secondary | ICD-10-CM

## 2019-11-21 DIAGNOSIS — E782 Mixed hyperlipidemia: Secondary | ICD-10-CM

## 2019-11-21 DIAGNOSIS — Z136 Encounter for screening for cardiovascular disorders: Secondary | ICD-10-CM | POA: Diagnosis not present

## 2019-11-21 DIAGNOSIS — E559 Vitamin D deficiency, unspecified: Secondary | ICD-10-CM

## 2019-11-21 DIAGNOSIS — M810 Age-related osteoporosis without current pathological fracture: Secondary | ICD-10-CM

## 2019-11-21 DIAGNOSIS — G4700 Insomnia, unspecified: Secondary | ICD-10-CM

## 2019-11-21 NOTE — Progress Notes (Signed)
MEDICARE WELLNESS Assessment & Plan:   Encounter for Medicare annual wellness exam 1 year  Syncope, unspecified syncope type -     EKG 12-Lead -     CBC with Diff -     COMPLETE METABOLIC PANEL WITH GFR -     Ambulatory referral to Neurology -     DG Chest 2 View; Future - normal EKG, normal cardiac workup previously.  - No CP, SOB, dizziness, before falling, no post ictal period afterwards, does not appear to be cardiac in origin and no seizure activity. Normal bilateral pulses/BP in arms, no subclavian steal.   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 Autonomic dysfunction, will check labs and refer to neuro for evaluation. May still need cardiac evaluation but had normal evaluation in 2014/2015, she has no symptoms.  Rule out TSH, anemia, B12 def, RPR.  Strict ER precautions discussed with patient and she agrees with current plan  Neuropathy -     RPR -     Ambulatory referral to Neurology Check labs ? If worsening idiopathic neuropathy has anything to do with syncopal episodes, will check.   Iron deficiency -     CBC with Diff -     Iron,Total/Total Iron Binding Cap -     Ferritin  B12 deficiency -     Vitamin B12  Hypothyroidism, unspecified type -     TSH  Medication management -     Magnesium  Rheumatoid arthritis involving multiple sites, unspecified whether rheumatoid factor present ([place]) Continue follow up Dr. Trudie Reed, patient is on treatment  Abnormal glucose Discussed disease progression and risks Discussed diet/exercise, weight management and risk modification  Overweight (BMI 25.0-29.9) - long discussion about weight loss, diet, and exercise -recommended diet heavy in fruits and veggies and low in animal meats, cheeses, and dairy products  Insomnia, unspecified type Continue medications  Vitamin D deficiency Continue supplement  Osteoporosis, unspecified  osteoporosis type, unspecified pathological fracture presence Continue fosamax for now, has been on x 2018, due 2 years DEXA  Personal history of colonic polyps OVERDUE, will call to schedule, check iron  Hyperlipidemia, mixed check lipids decrease fatty foods increase activity.    Subjective:    Patient ID: [name], female    DOB: [birth year], 74 y.o.   MRN: [id number]  HPI 74 y.o. former smoking (Q96) WF with history of HTN, HLD,history of pre-diabetes (abnormal glucose), overweight, vitamin D def, RA on MTX and plaquenil and vitamin D deficiency presents with syncopal episodes and medicare visit. Of note she had a work up for syncopal episodes/dizziness in 2014 with negative holter, normal stress test 2016 and normal MRI in 2012. She was taken off her HCTZ at that time.  She passed out in Nov while standing at the back door, in Jan she passed out once in the kitchen, she is concerned because it is becoming more frequently. She gets dizzy frequently. She thinks she is dizzy/passing out due to her neck, states when she looks up or with her neck in certain positions.   She was standing at the counter in the kitchen, and the "next thing I know i'm sliding into the dog bowl." She is uncertain of doing anything before that could have caused it,it was mid afternoon, she had bowl of cereal and piece of fruit that day around 11. It was non exertional, she was not reaching for anything in her cabinets  or bending down. She had no dizziness, CP, SOB, palpitations and denies any funny feeling prior to falling. She did not hit her head, his her right shoulder and right hip. She has no post ictal state or loss of bladder/bowel issues. She does not exercise due to foot issues.  BP: 130/72   She had a holter monitor in 10/2013 read by Dr. Mare Ferrari for syncope that was normal, showed PAC/PVC.   She had an MRI in 2012 for dizziness IMPRESSION:      Mild white matter changes consistent with chronic   ischemia.  No acute ischemia. No focal abnormality no focal enhancing abnormality or  mass lesion  Stress test 2016 Dr. Stanford Breed  Nuclear stress EF: 64%.  The study is normal.  This is a low risk study.  The left ventricular ejection fraction is normal (55-65%). Never had CXR  Lab Results  Component Value Date   TSH 1.08 09/03/2019   Never had iron/B12 done. Colonoscopy and EGD in 2014  She is on gabapentin 100mg  1 in AM and 2 at night, xanax, losartan 25mg  and off biotin x 1 month.  She sees Dr. Trudie Reed regularly and states that her RA is well controlled on plaquenil and Methotrexate  She is on fosamax for osteoporosis, stopped and restarted ago in fall 2018.   she has a diagnosis of insomnia and is currently on melatonin 20mg , advil pm and takes 0.5 mg of xanax (has failed multiple agents in the past including trazodone), reports symptoms are well controlled on current regimen.  she has a diagnosis of GERD with esophagitis and hx of PUD/gastric ulcer for which she continues management by prilosec 40 mg - was prescribed as BID but reports she only takes once daily and once again at night only if having problems.  she reports symptoms is currently well controlled, and denies breakthrough reflux, burning in chest, hoarseness or cough.    BMI is Body mass index is 30.65 kg/m., she is working on diet and exercise. Wt Readings from Last 3 Encounters:  11/21/19 167 lb 9.6 oz (76 kg)  09/03/19 161 lb (73 kg)  05/31/19 159 lb 6.4 oz (72.3 kg)   Her blood pressure is checked at home. Has been taking 50 mg losartan.   BP Readings from Last 3 Encounters:  11/21/19 130/72  09/03/19 134/72  05/31/19 140/82   She does not workout.  She denies chest pain, shortness of breath, dizziness.   She is not on cholesterol medication and denies myalgias. Her cholesterol is at goal. The cholesterol last visit was:   Lab Results  Component Value Date   CHOL 210 (H) 09/03/2019   HDL 73  09/03/2019   LDLCALC 119 (H) 09/03/2019   TRIG 79 09/03/2019   CHOLHDL 2.9 09/03/2019   Lab Results  Component Value Date   HGBA1C 5.2 05/31/2019    Patient is on Vitamin D supplement. Lab Results  Component Value Date   VD25OH 92 05/31/2019   She is on thyroid medication. Her medication was NOT changed last visit, 177mcg, 1 pill daily.  Patient denies nervousness, palpitations and weight changes.  Lab Results  Component Value Date   TSH 1.08 09/03/2019    Blood pressure 130/72, pulse 76, temperature 97.6 F (36.4 C), weight 167 lb 9.6 oz (76 kg), SpO2 99 %.  Medications  Current Outpatient Medications (Endocrine & Metabolic):  .  levothyroxine (SYNTHROID) 112 MCG tablet, Take 1 tablet daily on an empty stomach with only water for  30 minutes & no Antacid meds, Calcium or Magnesium for 4 hours & avoid Biotin  Current Outpatient Medications (Cardiovascular):  .  losartan (COZAAR) 25 MG tablet, Take 1 tablet (25 mg total) by mouth daily.   Current Outpatient Medications (Analgesics):  .  aspirin 81 MG tablet, Take 81 mg by mouth daily. Marland Kitchen  ibuprofen (ADVIL) 800 MG tablet, Take 1 tablet (800 mg total) by mouth every 8 (eight) hours as needed for mild pain or moderate pain. PRN  Current Outpatient Medications (Hematological):  .  folic acid (FOLVITE) 1 MG tablet, Take 3 mg by mouth daily.   Current Outpatient Medications (Other):  Marland Kitchen  ALPRAZolam (XANAX) 1 MG tablet, Take 1/2 to 1 tablet 1 to 2 x / day only if needed for Acute Anxiety Attack and please try to limit to 5 days /week to avoid addiction .  b complex vitamins capsule, Take 1 capsule by mouth daily. Marland Kitchen  BIOTIN PO, Take 1 tablet by mouth daily. .  Cholecalciferol (VITAMIN D PO), Take 5,000 Units by mouth daily.  Marland Kitchen  gabapentin (NEURONTIN) 100 MG capsule, Take 1 capsule 3 x /day for Chronic Pain .  GLUCOSAMINE-CHONDROITIN PO, Take 1 tablet by mouth daily. .  hydroxychloroquine (PLAQUENIL) 200 MG tablet, Take by mouth  daily. Take 1 tab AM and PM .  Magnesium 500 MG TABS, Take 1 tablet by mouth daily. Marland Kitchen  MELATONIN PO, Take 1 tablet by mouth at bedtime. .  methotrexate (RHEUMATREX) 2.5 MG tablet, Take 2.5 mg by mouth once a week. Patient injects 2.5 mg /ml 1 ml  weekly. Marland Kitchen  omeprazole (PRILOSEC) 40 MG capsule, TAKE 1 CAPSULE TWICE DAILY FOR ACID REFLUX .  OVER THE COUNTER MEDICATION, Takes hair, skin and nails 1 time daily. .  Probiotic CAPS, Take 1 capsule by mouth daily.  Problem list She has Essential hypertension; Rheumatoid arthritis ([place]); Hyperlipidemia, mixed; Gastroesophageal reflux disease without esophagitis; Personal history of colonic polyps; Vitamin D deficiency; Abnormal glucose; Medication management; Hypothyroidism; Osteoporosis; Hyperparathyroidism ([place]); Overweight (BMI 25.0-29.9); Insomnia; and Neuropathy on their problem list.  Preventative care: Last colonoscopy: 03/2013, 5 year follow up, Dr. Deatra Ina overdue Last mammogram: 07/2019 Last pap smear/pelvic exam: remote   DEXA: 05/2019 ON FOSAMAX since 07/2017   MRI head 2012 normal Echo 2012 normal Stress test 2016 US RENAL normal 2013 [place] AB 04/2013  Immunization History  Administered Date(s) Administered  . Influenza, High Dose Seasonal PF 07/16/2014, 09/17/2015, 07/01/2016, 08/04/2017, 08/25/2018  . Pneumococcal Conjugate-13 10/16/2015  . Pneumococcal Polysaccharide-23 11/08/2012  . Td 11/08/2012  . Zoster 09/17/2015   Prior vaccinations: TD or Tdap: 2014  Influenza: 2020 Prevnar 13: 2016 Pneumococcal: 2014 Shingles/Zostavax: 2016  Names of Other Physician/Practitioners you currently use: 1. [facility]- here for primary care 2. Dr. Gershon Crane, eye doctor, last visit q12 months 2019, has scheduled 2020 3. Dr. Orvil Feil, dentist, last 2020 Patient Care Team: [name], MD as PCP - General (Internal Medicine) Stark Klein, MD as Consulting Physician (General Surgery) Gavin Pound, MD as Consulting Physician (Rheumatology) Lelon Perla, MD as Consulting Physician (Cardiology) Inda Castle, MD (Inactive) as Consulting Physician (Gastroenterology) Starr Lake, MD as Referring Physician (Orthopedic Surgery)  Allergies Allergies  Allergen Reactions  . Ace Inhibitors Cough  . Ambien [Zolpidem] Other (See Comments)    Odd Feeling  . Crestor [Rosuvastatin]   . Pravastatin   . Prozac [Fluoxetine Hcl] Other (See Comments)    Decreased libido  . 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 FAMILY HISTORY Her family history includes Breast cancer in her paternal aunt; Colon cancer in her maternal grandfather; Heart attack in her brother and father; Lung cancer in her mother; Prostate cancer in her paternal grandfather; Stomach cancer in her father. SOCIAL HISTORY She  reports that she quit smoking about 24 years ago. She has never used smokeless tobacco. She reports current alcohol use. She reports that she does not use drugs.  MEDICARE WELLNESS OBJECTIVES: Physical activity: Current Exercise Habits: The patient does not participate in regular exercise at present, Exercise limited by: orthopedic condition(s)(foot pain) Cardiac risk factors: Cardiac Risk Factors include: advanced age (>48men, >61 women);dyslipidemia;hypertension;obesity (BMI >30kg/m2);sedentary lifestyle Depression/mood screen:   Depression screen [facility] 2/9 11/21/2019  Decreased Interest 0  Down, Depressed, Hopeless 0  PHQ - 2 Score 0    ADLs:  In your present state of health, do you have any difficulty performing  the following activities: 11/21/2019 06/02/2019  Hearing? N N  Vision? N N  Difficulty concentrating or making decisions? N N  Walking or climbing stairs? N N  Dressing or bathing? N N  Doing errands, shopping? N N  Some recent data might be hidden     Cognitive Testing  Alert? Yes  Normal Appearance?Yes  Oriented to person? Yes  Place? Yes   Time? Yes  Recall of three objects?  Yes  Can perform simple calculations? Yes  Displays appropriate judgment?Yes  Can read the correct time from a watch face?Yes  EOL planning: Does Patient Have a Medical Advance Directive?: No Would patient like information on creating a medical advance directive?: No - Patient declined(has papers at home)   Review of Systems     Objective:   Physical Exam General appearance: alert, no distress, WD/WN,  female HEENT: normocephalic, sclerae anicteric, TMs pearly, nares patent, no discharge or erythema, pharynx normal Oral cavity: MMM, no lesions Neck: supple, no lymphadenopathy, no thyromegaly, no masses Heart: RRR, normal S1, S2, 1/6 systolic murmur RSB Lungs: CTA bilaterally, no wheezes, rhonchi, or rales Abdomen: +bs, soft, nontender, non distended, no masses, no hepatomegaly, no splenomegaly Musculoskeletal:  no swelling, no obvious deformity Extremities: no edema, no cyanosis, no clubbing Pulses: 2+ symmetric, upper and lower extremities, normal cap refill Neurological: alert, oriented x 3, CN2-12 intact, tremor right thumb at rest, strength normal upper extremities and lower extremities, good finger to nose, DTRs 2+ throughout, decreased sensation bilateral feet and hands, + romberg, gait normal Psychiatric: normal affect, behavior normal, pleasant  Breast: defer Gyn: defer Rectal: defer   Medicare Attestation I have personally reviewed: The patient's medical and social history Their use of alcohol, tobacco or illicit drugs Their current medications and supplements The patient's functional  ability including ADLs,fall risks, home safety risks, cognitive, and hearing and visual impairment Diet and physical activities Evidence for depression or mood disorders  The patient's weight, height, BMI, and visual acuity have been recorded in the chart.  I have made referrals, counseling, and provided education to the patient based on review of the above and I have provided the patient with a written personalized care plan for preventive services.    Vicie Mutters, PA-C 11/21/19

## 2019-11-21 NOTE — Patient Instructions (Addendum)
Due to your neuropathy, and syncope I'm going to refer you to neurology I'm checking some labs on you as well.   Please in the mean time, get up slowly, get compression socks to wear, increase water and add in gatorade..   Go to the ER if any chest pain, shortness of breath, nausea, dizziness, severe HA, changes vision/speech  INFORMATION ABOUT YOUR XRAY  Can walk into [address] for an Insurance account manager. They will have the order and take you back. You do not any paper work, I should get the result back today or tomorrow. This order is good for a year.  Can call [phone] to schedule an appointment if you wish.    Autonomic Dysreflexia  Autonomic dysreflexia (AD) is a condition that occurs in people with spinal cord injuries. This condition causes irregular heartbeats, high blood pressure, changes in skin color, sweating, and muscle spasms. Common reasons for AD to start (triggers)include an infection, irritation, or injury. AD usually gets better when the trigger is gone. AD is a medical emergency and must be treated quickly. If AD is not treated, it can lead to a stroke, heart attack, or seizure. What are the causes? This condition is caused by an overactive autonomic nervous system (ANS). The ANS regulates body functions that happen without your control (involuntary actions). These include your heartbeat, breathing, and digestion. Any injury, irritation, or infection below the level of your spinal cord injury may trigger AD. Triggers include: Tight shoes or tight clothing. Urinary problems, such as: A full bladder. A bladder drainage catheter that becomes blocked. A bladder drainage catheter is a thin, flexible tube that is used to remove urine from your bladder. Urinary tract infection. Bowel problems, such as: A full stomach. Constipation. Skin problems, such as: Cuts or bruises. Skin pressure ulcers. Burns. Other conditions, such as: Menstrual period cramps. Pressure on the  scrotum (scrotal compression). Sexual activity. What increases the risk? This condition most commonly occurs in people who have a spinal cord injury. It can also occur if: You use illegal stimulant drugs, such as cocaine or amphetamines. You have head trauma. You have bleeding in your brain (subarachnoid hemorrhage). What are the signs or symptoms? Symptoms of this condition start suddenly and may include: High blood pressure. Pounding headache. Feeling warm (flushed) in your face or in skin above the level of the spinal cord injury. Upper body sweating. Goosebumps above or below the level of the spinal cord injury. Cool, pale skin below the level of the spinal cord injury. Irregular heartbeat. It may be fast or slow. Blurry vision. Dizziness. Anxiety. Painful muscle spasms. Fever. Nasal congestion. Nausea. How is this diagnosed? Your health care provider may suspect AD based on your symptoms. Your health care provider will do a physical exam. You may also have tests, such as: Blood tests. Urine tests to look for infection. EKG heart tracing. Chest X-ray and other imaging studies. How is this treated? This condition must be treated right away. Immediate treatment may include: Rinsing out (flushing) or replacing a catheter in your bladder, if it is blocked. Emptying your bladder with a catheter. Removing tight clothing. Getting into a sitting position (raising your head and lowering your legs). Taking medicine to lower your blood pressure or control your heart rate. Other treatments may include: Emptying your bowels if you are constipated. Treating an infection with antibiotic medicine. After the episode is over, your health care provider may also recommend: A bladder management program. A substance that blocks  the transfer of impulses between nerves (anticholinergic medicines) to prevent another episode. In a severe case, you may need a procedure to treat the nerve responses  in your bladder, which may include: An injection of botulinum toxin. This reduces muscle spasms. Cutting or removing a nerve (sympathectomy). Follow these instructions at home: Make sure you and your caregivers at home know the symptoms and triggers of AD. If you have symptoms of AD: Remove or relieve the trigger as soon as possible. Loosen your clothing. If you have a catheter, check if it is blocked. Flush or replace it if needed. Raise your upper body or the head of your bed so that you are sitting upright. Lower your legs. Check your blood pressure. Follow the bladder management program as directed by your health care provider. Do not force yourself to void or manually press down on your bladder. Check your blood pressure as told by your health care provider. Make sure you know: How to use the blood pressure cuff. What a normal blood pressure level is for you. Carry a wallet card that explains the symptoms of AD and treatment for AD. You can get a card from the [facility]: www.[employer].org Take over-the-counter and prescription medicines only as told by your health care provider. Keep all follow-up visits as told by your health care provider. This is important. How is this prevented? To prevent AD: Avoid an overfilled (distended) bladder. Do your best to have regular bowel movements. Ask your health care provider what to do to prevent constipation. Check if your urine is cloudy or has a bad smell. These are signs of infection. Check your skin and feet for redness, swelling, cuts, scrapes, ulcers, or bruises. Avoid any triggers as much as possible, including: Tight clothing and shoes. Illegal drugs. Avoid burns from the sun or from very hot water. Contact a health care provider if: You are constipated. You are having difficulty urinating. Get help right away if: Your blood pressure suddenly goes up. You have signs or symptoms of AD that do not go  away after treatment at home. You have chest pain. You have trouble breathing. You have sudden loss of vision. You have sudden weakness or numbness on one side of your face or body. You become very confused. You cannot speak or understand speech. These symptoms may represent a serious problem that is an emergency. Do not wait to see if the symptoms will go away. Get medical help right away. Call your local emergency services (911 in the [place] Do not drive yourself to the hospital. Summary Autonomic dysreflexia (AD) is a condition that occurs in people with spinal cord injuries. This condition is caused by an overactive autonomic nervous system (ANS), which controls heartbeat, breathing, or digestion. Any injury, irritation, or infection below the level of your spinal cord injury may cause AD to start (may trigger AD). Symptoms of AD must be treated right away. Treatment depends on what triggers your symptoms to start. Treatment may include emptying your bladder or bowels, getting into a sitting position (raising your head and lowering your legs), loosening tight clothes or shoes, or taking medicines to lower your blood pressure. Get help right away if you have sudden high blood pressure, loss of vision, chest pain, trouble breathing, or weakness on one side of your body. This information is not intended to replace advice given to you by your health care provider. Make sure you discuss any questions you have with your health care provider.  Document Revised: 11/28/2018 Document Reviewed: 05/27/2017 Elsevier Patient Education  [place].  Syncope  Syncope refers to a condition in which a person temporarily loses consciousness. Syncope may also be called fainting or passing out. It is caused by a sudden decrease in blood flow to the brain. Even though most causes of syncope are not dangerous, syncope can be a sign of a serious medical problem. Your health care provider may do tests to find  the reason why you are having syncope. Signs that you may be about to faint include:  Feeling dizzy or light-headed.  Feeling nauseous.  Seeing all white or all black in your field of vision.  Having cold, clammy skin. If you faint, get medical help right away. Call your local emergency services (911 in the [place] Do not drive yourself to the hospital. Follow these instructions at home: Pay attention to any changes in your symptoms. Take these actions to stay safe and to help relieve your symptoms: Lifestyle  Do not drive, use machinery, or play sports until your health care provider says it is okay.  Do not drink alcohol.  Do not use any products that contain nicotine or tobacco, such as cigarettes and e-cigarettes. If you need help quitting, ask your health care provider.  Drink enough fluid to keep your urine pale yellow. General instructions  Take over-the-counter and prescription medicines only as told by your health care provider.  If you are taking blood pressure or heart medicine, get up slowly and take several minutes to sit and then stand. This can reduce dizziness or light-headedness.  Have someone stay with you until you feel stable.  If you start to feel like you might faint, lie down right away and raise (elevate) your feet above the level of your heart. Breathe deeply and steadily. Wait until all the symptoms have passed.  Keep all follow-up visits as told by your health care provider. This is important. Get help right away if you:  Have a severe headache.  Faint once or repeatedly.  Have pain in your chest, abdomen, or back.  Have a very fast or irregular heartbeat (palpitations).  Have pain when you breathe.  Are bleeding from your mouth or rectum, or you have black or tarry stool.  Have a seizure.  Are confused.  Have trouble walking.  Have severe weakness.  Have vision problems. These symptoms may represent a serious problem that is an  emergency. Do not wait to see if your symptoms will go away. Get medical help right away. Call your local emergency services (911 in the [place] Do not drive yourself to the hospital. Summary  Syncope refers to a condition in which a person temporarily loses consciousness. It is caused by a sudden decrease in blood flow to the brain.  Signs that you may be about to faint include dizziness, feeling light-headed, feeling nauseous, sudden vision changes, or cold, clammy skin.  Although most causes of syncope are not dangerous, syncope can be a sign of a serious medical problem. If you faint, get medical help right away. This information is not intended to replace advice given to you by your health care provider. Make sure you discuss any questions you have with your health care provider. Document Revised: 10/07/2017 Document Reviewed: 10/03/2017 Elsevier Patient Education  2020 [employer].

## 2019-11-22 ENCOUNTER — Encounter: Payer: Self-pay | Admitting: Physician Assistant

## 2019-11-22 DIAGNOSIS — I700 Atherosclerosis of aorta: Secondary | ICD-10-CM | POA: Insufficient documentation

## 2019-11-22 LAB — CBC WITH DIFFERENTIAL/PLATELET
Absolute Monocytes: 612 cells/uL (ref 200–950)
Basophils Absolute: 50 cells/uL (ref 0–200)
Basophils Relative: 0.7 %
Eosinophils Absolute: 209 cells/uL (ref 15–500)
Eosinophils Relative: 2.9 %
HCT: 36 % (ref 35.0–45.0)
Hemoglobin: 11.9 g/dL (ref 11.7–15.5)
Lymphs Abs: 1246 cells/uL (ref 850–3900)
MCH: 30.4 pg (ref 27.0–33.0)
MCHC: 33.1 g/dL (ref 32.0–36.0)
MCV: 91.8 fL (ref 80.0–100.0)
MPV: 12.8 fL — ABNORMAL HIGH (ref 7.5–12.5)
Monocytes Relative: 8.5 %
Neutro Abs: 5083 cells/uL (ref 1500–7800)
Neutrophils Relative %: 70.6 %
Platelets: 169 10*3/uL (ref 140–400)
RBC: 3.92 10*6/uL (ref 3.80–5.10)
RDW: 14.2 % (ref 11.0–15.0)
Total Lymphocyte: 17.3 %
WBC: 7.2 10*3/uL (ref 3.8–10.8)

## 2019-11-22 LAB — COMPLETE METABOLIC PANEL WITH GFR
AG Ratio: 2.4 (calc) (ref 1.0–2.5)
ALT: 21 U/L (ref 6–29)
AST: 33 U/L (ref 10–35)
Albumin: 4.5 g/dL (ref 3.6–5.1)
Alkaline phosphatase (APISO): 72 U/L (ref 37–153)
BUN/Creatinine Ratio: 7 (calc) (ref 6–22)
BUN: 6 mg/dL — ABNORMAL LOW (ref 7–25)
CO2: 29 mmol/L (ref 20–32)
Calcium: 11.1 mg/dL — ABNORMAL HIGH (ref 8.6–10.4)
Chloride: 107 mmol/L (ref 98–110)
Creat: 0.9 mg/dL (ref 0.60–0.93)
GFR, Est African American: 74 mL/min/{1.73_m2} (ref >=60)
GFR, Est Non African American: 64 mL/min/{1.73_m2} (ref >=60)
Globulin: 1.9 g/dL (calc) (ref 1.9–3.7)
Glucose, Bld: 81 mg/dL (ref 65–99)
Potassium: 4.1 mmol/L (ref 3.5–5.3)
Sodium: 143 mmol/L (ref 135–146)
Total Bilirubin: 0.6 mg/dL (ref 0.2–1.2)
Total Protein: 6.4 g/dL (ref 6.1–8.1)

## 2019-11-22 LAB — TSH: TSH: 2.69 mIU/L (ref 0.40–4.50)

## 2019-11-22 LAB — FERRITIN: Ferritin: 36 ng/mL (ref 16–288)

## 2019-11-22 LAB — IRON, TOTAL/TOTAL IRON BINDING CAP
%SAT: 12 % (calc) — ABNORMAL LOW (ref 16–45)
Iron: 41 ug/dL — ABNORMAL LOW (ref 45–160)
TIBC: 343 mcg/dL (calc) (ref 250–450)

## 2019-11-22 LAB — MAGNESIUM: Magnesium: 2.3 mg/dL (ref 1.5–2.5)

## 2019-11-22 LAB — RPR: RPR Ser Ql: NONREACTIVE

## 2019-11-22 LAB — VITAMIN B12: Vitamin B-12: 1332 pg/mL — ABNORMAL HIGH (ref 200–1100)

## 2019-11-26 ENCOUNTER — Encounter: Payer: Self-pay | Admitting: Neurology

## 2019-11-26 ENCOUNTER — Other Ambulatory Visit: Payer: Self-pay

## 2019-11-26 ENCOUNTER — Ambulatory Visit: Payer: Medicare PPO | Admitting: Neurology

## 2019-11-26 VITALS — Temp 96.8°F | Ht 62.0 in | Wt 167.0 lb

## 2019-11-26 DIAGNOSIS — R296 Repeated falls: Secondary | ICD-10-CM

## 2019-11-26 DIAGNOSIS — G609 Hereditary and idiopathic neuropathy, unspecified: Secondary | ICD-10-CM | POA: Diagnosis not present

## 2019-11-26 DIAGNOSIS — R55 Syncope and collapse: Secondary | ICD-10-CM

## 2019-11-26 NOTE — Patient Instructions (Addendum)
I am not sure how to explain your passing out spells.  However, you have fallen and hurt yourself including your left hip and your knees.  If you fall and pass out, please call 911, when you can or have someone call for you, as you may be seriously injured and not know it at the time. Please try to hydrate well with water, 6 to 8 cups of water are recommended, 8 ounce each.  Please reduce your tea and coffee intake, even decaf tea contains some caffeine. You have had recent extensive blood work through your primary care last week, we will add a few more tests today.  In addition, we will do an EEG (brainwave test), which we will schedule. We will call you with the results. We will do a brain scan, called MRI and call you with the test results. We will have to schedule you for this on a separate date. This test requires authorization from your insurance, and we will take care of the insurance process. We will do an EMG and nerve conduction velocity test, which is an electrical nerve and muscle test, which we will schedule. We will call you with the results.

## 2019-11-26 NOTE — Progress Notes (Signed)
Subjective:    Patient ID: [name] is a 74 y.o. female.  HPI     Tammie Age, MD, PhD [facility] [address] [place], [place] [address]  Dear Estill Bamberg,  I saw your patient, [name], upon your kind request in my neurologic clinic today for initial consultation of her neuropathy and recent syncopal spell.  The patient is unaccompanied today.  As you know, [name] is a 73 year old right-handed woman with an underlying medical history of iron deficiency, B12 deficiency, hypothyroidism, obesity, insomnia, rheumatoid arthritis, and neuropathy, who reports recurrent syncopal spells for the past 2 years.  She has had several spells of passing out and has fallen and has injured herself but has not called 911 or has gone to the emergency room for any acute issues.  Her husband had been around for some of these falls and passing out spells.  She reports that he just helps her back up.  She reports that she injured her left hip and had a large bruise on it most recently.  She has never sought immediate medical attention and has not had any x-rays done or any imaging studies, no brain scans.  She reports that she got scared the last time as she hurt herself.  She has seen a cardiologist and had a heart monitor.  She reports that she does not sleep well, has trouble going to sleep and has been on Xanax, 1 mg strength half a pill to 1 pill at night.  She also takes gabapentin for neuropathy for the past few years.  She reports that several years ago she started having burning sensation in her feet.  She had EMG and nerve conduction testing under Dr. Melrose Nakayama at [facility]. I was able to review her EMG and nerve conduction velocity test result from January 18, 2018:  Impression: Abnormal study. There is electrodiagnostic evidence of a chronic, mild left lower lumbosacral polyradiculopathy.   Clinical Correlation: This finding does not appear  to adequately explain her symptoms and exam findings which raise concern for a musculoskeletal etiology vs small fiber neuropathy.   She had recent blood work through your office on 11/21/2019 and I reviewed the results: TSH was normal, CBC with differential was normal, CMP was unremarkable, B12 was elevated at 1332, she reports that she takes an oral supplement.  RPR was nonreactive.  She denies any progressive symptoms of neuropathy.  She denies having any convulsions or twitching spells.  She is not sure how long she passes out for.  She denies any bowel or bladder incontinence or tongue bites.  She reports that she never injured her head.  She has a history of chronic neck pain, in the past, she had radiating pain to the right arm.  She is followed for rheumatoid arthritis by Dr. Trudie Reed.  She does not drink much in the way of water, she drinks decaf tea, about a gallon per day and some coffee.  She has had intermittent dizzy feeling but denies any spinning sensation, any hearing loss or tinnitus.  She denies any recurrent headaches, she denies one-sided weakness or numbness or tingling, feels like her numbness in the feet and fingertips have been stable for years.  Several years ago she started having intermittent itching in different parts of the body, no obvious rash, this was even as far back as her teenage years. She reports snoring, per husband's report, denies any gasping sensation, denies any night to night  nocturia, denies witnessed apneas.  Her Past Medical History Is Significant For: Past Medical History:  Diagnosis Date  . Anemia    Hx of   . Arthritis   . Calculus of bile duct without mention of cholecystitis or obstruction   . Cataract   . GERD (gastroesophageal reflux disease)   . H. pylori infection    Hx of   . Heart murmur   . Hyperlipidemia   . Hypertension   . Hypothyroid   . PUD (peptic ulcer disease)   . Rheumatoid arthritis(714.0)     Her Past Surgical History Is  Significant For: Past Surgical History:  Procedure Laterality Date  . AV FISTULA REPAIR    . BLADDER SUSPENSION    . BREAST CYST EXCISION Left   . BUNIONECTOMY    . CARPAL TUNNEL RELEASE    . CHOLECYSTECTOMY  09/07/2011  . CYSTECTOMY     left breast  . ERCP  09/29/2011   Procedure: ENDOSCOPIC RETROGRADE CHOLANGIOPANCREATOGRAPHY (ERCP);  Surgeon: Inda Castle, MD;  Location: [facility];  Service: Endoscopy;  Laterality: N/A;  . PILONIDAL CYST EXCISION    . TENOLYSIS  10/26/2011   Procedure: TENDON SHEATH RELEASE/TENOLYSIS;  Surgeon: Wynonia Sours, MD;  Location: [facility];  Service: Orthopedics;  Laterality: Right;  tenosynovectomy removal superficialis slip right index finger  . TONSILLECTOMY    . TOTAL SHOULDER REPLACEMENT Right 02/22/2014   Dr. Enrigue Catena at [facility]  . TRIGGER FINGER RELEASE    . VAGINAL HYSTERECTOMY     ovaries not removed    Her Family History Is Significant For: Family History  Problem Relation Vazquez of Onset  . Lung cancer Mother   . Stomach cancer Father   . Heart attack Father        MI at Vazquez 57  . Colon cancer Maternal Grandfather   . Heart attack Brother        MI at Vazquez 51  . Prostate cancer Paternal Grandfather   . Breast cancer Paternal Aunt     Her Social History Is Significant For: Social History   Socioeconomic History  . Marital status: Married    Spouse name: Not on file  . Number of children: 2  . Years of education: Not on file  . Highest education level: Not on file  Occupational History  . Occupation: retired    Fish farm manager: RETIRED    Comment: Retired  Tobacco Use  . Smoking status: Former Smoker    Quit date: 10/25/1995    Years since quitting: 24.1  . Smokeless tobacco: Never Used  . Tobacco comment: Maryann Conners [id number]  Substance and Sexual Activity  . Alcohol use: Yes    Alcohol/week: 0.0 standard drinks    Comment: occasional  . Drug use: No  . Sexual activity: Yes    Birth control/protection:  Post-menopausal  Other Topics Concern  . Not on file  Social History Narrative  . Not on file   Social Determinants of Health   Financial Resource Strain:   . Difficulty of Paying Living Expenses: Not on file  Food Insecurity:   . Worried About Charity fundraiser in the Last Year: Not on file  . Ran Out of Food in the Last Year: Not on file  Transportation Needs:   . Lack of Transportation (Medical): Not on file  . Lack of Transportation (Non-Medical): Not on file  Physical Activity:   . Days of Exercise per Week: Not on file  . Minutes  of Exercise per Session: Not on file  Stress:   . Feeling of Stress : Not on file  Social Connections:   . Frequency of Communication with Friends and Family: Not on file  . Frequency of Social Gatherings with Friends and Family: Not on file  . Attends Religious Services: Not on file  . Active Member of Clubs or Organizations: Not on file  . Attends Archivist Meetings: Not on file  . Marital Status: Not on file    Her Allergies Are:  Allergies  Allergen Reactions  . Ace Inhibitors Cough  . Ambien [Zolpidem] Other (See Comments)    Odd Feeling  . Crestor [Rosuvastatin]   . Pravastatin   . Prozac [Fluoxetine Hcl] Other (See Comments)    Decreased libido  . Zoloft [Sertraline Hcl]   :   Her Current Medications Are:  Outpatient Encounter Medications as of 11/26/2019  Medication Sig  . ALPRAZolam (XANAX) 1 MG tablet Take 1/2 to 1 tablet 1 to 2 x / day only if needed for Acute Anxiety Attack and please try to limit to 5 days /week to avoid addiction  . aspirin 81 MG tablet Take 81 mg by mouth daily.  Marland Kitchen b complex vitamins capsule Take 1 capsule by mouth daily.  Marland Kitchen BIOTIN PO Take 1 tablet by mouth daily.  . Cholecalciferol (VITAMIN D PO) Take 5,000 Units by mouth daily.   . folic acid (FOLVITE) 1 MG tablet Take 3 mg by mouth daily.   Marland Kitchen gabapentin (NEURONTIN) 100 MG capsule Take 1 capsule 3 x /day for Chronic Pain  .  GLUCOSAMINE-CHONDROITIN PO Take 1 tablet by mouth daily.  . hydroxychloroquine (PLAQUENIL) 200 MG tablet Take by mouth daily. Take 1 tab AM and PM  . ibuprofen (ADVIL) 800 MG tablet Take 1 tablet (800 mg total) by mouth every 8 (eight) hours as needed for mild pain or moderate pain. PRN  . levothyroxine (SYNTHROID) 112 MCG tablet Take 1 tablet daily on an empty stomach with only water for 30 minutes & no Antacid meds, Calcium or Magnesium for 4 hours & avoid Biotin  . losartan (COZAAR) 25 MG tablet Take 1 tablet (25 mg total) by mouth daily.  . Magnesium 500 MG TABS Take 1 tablet by mouth daily.  Marland Kitchen MELATONIN PO Take 1 tablet by mouth at bedtime.  . methotrexate (RHEUMATREX) 2.5 MG tablet Take 2.5 mg by mouth once a week. Patient injects 2.5 mg /ml 1 ml [place] weekly.  Marland Kitchen omeprazole (PRILOSEC) 40 MG capsule TAKE 1 CAPSULE TWICE DAILY FOR ACID REFLUX  . OVER THE COUNTER MEDICATION Takes hair, skin and nails 1 time daily.  . Probiotic CAPS Take 1 capsule by mouth daily.   No facility-administered encounter medications on file as of 11/26/2019.  :  Review of Systems:  Out of a complete 14 point review of systems, all are reviewed and negative with the exception of these symptoms as listed below: Review of Systems  Neurological:       Pt sts she is her to discuss syncopal episodes and neuropathy. Last syncopal was the beginning of January. Pt was in her home and passed out lading in her dog bowl. She report 5-6 total syncopal episode over the last several years. Sts burning,nubmness and at time hypersensitivity is located in bilateral feet. Reports sx are equal in both.     Objective:  Neurological Exam  Physical Exam Physical Examination:   Vitals:   11/26/19 1404  Temp: (!) 96.8  F (36 C)   On orthostatic testing, she has no obvious orthostatic symptoms or drop in blood pressure. She denies any vertiginous symptoms.  General Examination: The patient is a very pleasant 74 y.o. female in  no acute distress. She appears well-developed and well groomed.   HEENT: Normocephalic, atraumatic, pupils are equal, round and reactive to light and accommodation. Funduscopic exam is normal with sharp disc margins noted. Extraocular tracking is good without limitation to gaze excursion or nystagmus noted. Normal smooth pursuit is noted. Hearing is grossly intact. Tympanic membranes are clear bilaterally. Face is symmetric with normal facial animation and normal facial sensation. Speech is clear with no dysarthria noted. There is no hypophonia. There is no lip, neck/head, jaw or voice tremor. Neck is supple with full range of passive and active motion. There are no carotid bruits on auscultation. Oropharynx exam reveals: mild mouth dryness, adequate dental hygiene and mild airway crowding, due to Smaller airway entry and redundant soft palate.  Mallampati is class II.  Tongue protrudes centrally in palate elevates symmetrically.  Chest: Clear to auscultation without wheezing, rhonchi or crackles noted.  Heart: S1+S2+0, regular and normal without murmurs, rubs or gallops noted.   Abdomen: Soft, non-tender and non-distended with normal bowel sounds appreciated on auscultation.  Extremities: There is no pitting edema in the distal lower extremities bilaterally. Pedal pulses are intact.  Skin: Warm and dry without trophic changes noted. There are no varicose veins.  Musculoskeletal: exam reveals Arthritic changes and some ulnar deviation in both upper extremities.   Neurologically:  Mental status: The patient is awake, alert and oriented in all 4 spheres. Her immediate and remote memory, attention, language skills and fund of knowledge are appropriate. There is no evidence of aphasia, agnosia, apraxia or anomia. Speech is clear with normal prosody and enunciation. Thought process is linear. Mood is normal and affect is normal.  Cranial nerves II - XII are as described above under HEENT exam. In  addition: shoulder shrug is normal with equal shoulder height noted. Motor exam: Normal bulk, strength and tone is noted. There is no drift, tremor or rebound. Romberg is negative. Reflexes are 1+ throughout. Babinski: Toes are flexor bilaterally. Fine motor skills and coordination: intact with normal finger taps, normal hand movements, normal rapid alternating patting, normal foot taps and normal foot agility.  Cerebellar testing: No dysmetria or intention tremor on finger to nose testing. Heel to shin is unremarkable bilaterally. There is no truncal or gait ataxia.  Sensory exam: intact to Intact to light touch and vibration in both upper and lower extremities even distally.  She has decreased sensation to temperature and pinprickIn the distal lower extremities up to both ankles or dorsi of the feet, also in The upper extremities up to the wrists.  Gait, station and balance: She stands easily. No veering to one side is noted. No leaning to one side is noted. Posture is Vazquez-appropriate and stance is narrow based. Gait shows normal stride length and normal pace. No problems turning are noted. No limp noted, walks without a walking aid, preserved arm swing.  Tandem walk is challenging for her.    Assessment and plan:  In summary, AFUA BOST is a very pleasant 74 y.o.-year old female with an underlying medical history of iron deficiency, B12 deficiency, hypothyroidism, obesity, insomnia, rheumatoid arthritis, and neuropathy, who Presents for evaluation of her recurrent syncopal spells. Differential diagnosis includes orthostatic hypotension or vasovagal syncope. Her history is not suggestive of any primary neurological  cause, nevertheless, I suggested work-up in the form of brain MRI with and without contrast as well as EEG. She is strongly advised to seek immediate medical attention if she passes out and falls down. She may be injured and not know it at the time. It is surprising to me that she has  never had to go to the ER or call 911 despite being scared about injuries at home. Her husband has witnessed some of the falls or has been around and Has helped her up after she passed out and fell down. Fall prevention is going to be important. She has seen cardiology. She reports having no prodromal symptoms or triggers. She has had no convulsions, no bowel or bladder incontinence and no tongue bites. Nevertheless, I would like to proceed with an EEG and a brain scan. She is advised to stay better hydrated with water. She is encouraged to reduce her tea intake although she reports drinking decaf tea.We will keep her posted as to her MRI and EEG results. In addition, she reports a longstanding history of neuropathy. I was able to review her EMG and nerve conduction velocity test from March 2019, which indicated mild left lower lumbosacral polyradiculopathy but no clear-cut evidence of neuropathy. We will go ahead and repeat her EMG nerve conduction velocity testing in our office. She had quite extensive blood work through your office last week, I would like to add just a few more things. She is followed by rheumatology for her chronic rheumatoid arthritis. She is advised that we will call her with her test results and proceed from there. She is encouraged to consider coming in for a sleep study if sleep disturbance continues to be a problem. She may have underlying sleep disordered breathing without telltale symptoms of OSA.  Thank you very much for allowing me to participate in the care of this nice patient. If I can be of any further assistance to you please do not hesitate to call me at [phone]. This was an extended visit of over 65 minutes, including reviewing of outside records, test results, precharting.  Sincerely,   Tammie Age, MD, PhD

## 2019-12-02 LAB — MULTIPLE MYELOMA PANEL, SERUM
Albumin SerPl Elph-Mcnc: 3.9 g/dL (ref 2.9–4.4)
Albumin/Glob SerPl: 1.7 (ref 0.7–1.7)
Alpha 1: 0.3 g/dL (ref 0.0–0.4)
Alpha2 Glob SerPl Elph-Mcnc: 0.6 g/dL (ref 0.4–1.0)
B-Globulin SerPl Elph-Mcnc: 0.8 g/dL (ref 0.7–1.3)
Gamma Glob SerPl Elph-Mcnc: 0.6 g/dL (ref 0.4–1.8)
Globulin, Total: 2.3 g/dL (ref 2.2–3.9)
IgA/Immunoglobulin A, Serum: 98 mg/dL (ref 64–422)
IgG (Immunoglobin G), Serum: 677 mg/dL (ref 586–1602)
IgM (Immunoglobulin M), Srm: 72 mg/dL (ref 26–217)
Total Protein: 6.2 g/dL (ref 6.0–8.5)

## 2019-12-02 LAB — VITAMIN B6: Vitamin B6: 56.3 ug/L — ABNORMAL HIGH (ref 2.0–32.8)

## 2019-12-02 LAB — HGB A1C W/O EAG: Hgb A1c MFr Bld: 5.3 % (ref 4.8–5.6)

## 2019-12-02 LAB — CK: Total CK: 206 U/L — ABNORMAL HIGH (ref 32–182)

## 2019-12-02 LAB — VITAMIN B1: Thiamine: 199.2 nmol/L (ref 66.5–200.0)

## 2019-12-03 ENCOUNTER — Ambulatory Visit: Payer: Medicare PPO | Admitting: Diagnostic Neuroimaging

## 2019-12-03 ENCOUNTER — Other Ambulatory Visit: Payer: Self-pay

## 2019-12-03 DIAGNOSIS — G609 Hereditary and idiopathic neuropathy, unspecified: Secondary | ICD-10-CM

## 2019-12-03 DIAGNOSIS — R55 Syncope and collapse: Secondary | ICD-10-CM

## 2019-12-03 DIAGNOSIS — R296 Repeated falls: Secondary | ICD-10-CM

## 2019-12-03 NOTE — Progress Notes (Signed)
Please call patient regarding her blood work: Her muscle enzyme called CK was mildly elevated at 206, upper limit of normal is 182, the increase in CK level may be explained by her recent falls.  In addition, her vitamin B6 level was elevated, most likely this is because she is taking a supplement.  I would for now hold taking extra B6 supplementation.  Otherwise, labs were normal, thx.

## 2019-12-04 ENCOUNTER — Other Ambulatory Visit: Payer: Self-pay

## 2019-12-04 ENCOUNTER — Ambulatory Visit: Payer: Medicare PPO | Admitting: Podiatry

## 2019-12-04 ENCOUNTER — Telehealth: Payer: Self-pay

## 2019-12-04 DIAGNOSIS — S93401D Sprain of unspecified ligament of right ankle, subsequent encounter: Secondary | ICD-10-CM

## 2019-12-04 DIAGNOSIS — IMO0001 Reserved for inherently not codable concepts without codable children: Secondary | ICD-10-CM

## 2019-12-04 NOTE — Progress Notes (Signed)
Subjective: 74 year old female presents the office today for follow-up evaluation of her right ankle sprain.  She states that she is doing much better.  She has some occasional discomfort but overall improved.  She was wearing an ankle brace for short time but as it was feeling better she stopped wearing this and she is wearing compression socks which is helpful.  Denies any systemic complaints such as fevers, chills, nausea, vomiting. No acute changes since last appointment, and no other complaints at this time.   Objective: AAO x3, NAD DP/PT pulses palpable bilaterally, CRT less than 3 seconds Mild tenderness along the course of the ATFL along the right lateral ankle.  Mild increase in anterior drawer test.  Negative talar tilt test.  Minimal edema.  No erythema or warmth. No pain with calf compression, swelling, warmth, erythema  Assessment: Right lateral ankle sprain with improvement  Plan: -All treatment options discussed with the patient including all alternatives, risks, complications.  -Overall she is doing much better.  She is able to do daily activities without any significant pain.  Discussed physical therapy but she states that she feels that she does not get improvement at home with home exercises so we will continue with this.  Also she is doing better we will order an MRI but if symptoms continue or worsen we will order the MRI.  Continue with supportive shoes.  She will get this plan.  I will see her back as needed encouraged to call if symptoms are not improving or resolved in the next 4 weeks. -Continue compression stockings -Patient encouraged to call the office with any questions, concerns, change in symptoms.   Return if symptoms worsen or fail to improve.  Trula Slade DPM

## 2019-12-04 NOTE — Patient Instructions (Signed)
Ankle Sprain, Phase I Rehab An ankle sprain is an injury to the ligaments of your ankle. Ankle sprains cause stiffness, loss of motion, and loss of strength. Ask your health care provider which exercises are safe for you. Do exercises exactly as told by your health care provider and adjust them as directed. It is normal to feel mild stretching, pulling, tightness, or discomfort as you do these exercises. Stop right away if you feel sudden pain or your pain gets worse. Do not begin these exercises until told by your health care provider. Stretching and range-of-motion exercises These exercises warm up your muscles and joints and improve the movement and flexibility of your lower leg and ankle. These exercises also help to relieve pain and stiffness. Gastroc and soleus stretch This exercise is also called a calf stretch. It stretches the muscles in the back of the lower leg. These muscles are the gastrocnemius, or gastroc, and the soleus. 1. Sit on the floor with your left / right leg extended. 2. Loop a belt or towel around the ball of your left / right foot. The ball of your foot is on the walking surface, right under your toes. 3. Keep your left / right ankle and foot relaxed and keep your knee straight while you use the belt or towel to pull your foot toward you. You should feel a gentle stretch behind your calf or knee in your gastroc muscle. 4. Hold this position for __________ seconds, then release to the starting position. 5. Repeat the exercise with your knee bent. You can put a pillow or a rolled bath towel under your knee to support it. You should feel a stretch deep in your calf in the soleus muscle or at your Achilles tendon. Repeat __________ times. Complete this exercise __________ times a day. Ankle alphabet  1. Sit with your left / right leg supported at the lower leg. ? Do not rest your foot on anything. ? Make sure your foot has room to move freely. 2. Think of your left / right  foot as a paintbrush. ? Move your foot to trace each letter of the alphabet in the air. Keep your hip and knee still while you trace. ? Make the letters as large as you can without feeling discomfort. 3. Trace every letter from A to Z. Repeat __________ times. Complete this exercise __________ times a day. Strengthening exercises These exercises build strength and endurance in your ankle and lower leg. Endurance is the ability to use your muscles for a long time, even after they get tired. Ankle dorsiflexion  1. Secure a rubber exercise band or tube to an object, such as a table leg, that will stay still when the band is pulled. Secure the other end around your left / right foot. 2. Sit on the floor facing the object, with your left / right leg extended. The band or tube should be slightly tense when your foot is relaxed. 3. Slowly bring your foot toward you, bringing the top of your foot toward your shin (dorsiflexion), and pulling the band tighter. 4. Hold this position for __________ seconds. 5. Slowly return your foot to the starting position. Repeat __________ times. Complete this exercise __________ times a day. Ankle plantar flexion  1. Sit on the floor with your left / right leg extended. 2. Loop a rubber exercise tube or band around the ball of your left / right foot. The ball of your foot is on the walking surface, right under your   toes. ? Hold the ends of the band or tube in your hands. ? The band or tube should be slightly tense when your foot is relaxed. 3. Slowly point your foot and toes downward to tilt the top of your foot away from your shin (plantar flexion). 4. Hold this position for __________ seconds. 5. Slowly return your foot to the starting position. Repeat __________ times. Complete this exercise __________ times a day. Ankle eversion 1. Sit on the floor with your legs straight out in front of you. 2. Loop a rubber exercise band or tube around the ball of your left  / right foot. The ball of your foot is on the walking surface, right under your toes. ? Hold the ends of the band in your hands, or secure the band to a stable object. ? The band or tube should be slightly tense when your foot is relaxed. 3. Slowly push your foot outward, away from your other leg (eversion). 4. Hold this position for __________ seconds. 5. Slowly return your foot to the starting position. Repeat __________ times. Complete this exercise __________ times a day. This information is not intended to replace advice given to you by your health care provider. Make sure you discuss any questions you have with your health care provider. Document Revised: 02/13/2019 Document Reviewed: 08/07/2018 Elsevier Patient Education  2020 [employer]  

## 2019-12-04 NOTE — Telephone Encounter (Signed)
-----   Message from Star Age, MD sent at 12/03/2019  7:36 AM EST ----- Please call patient regarding her blood work: Her muscle enzyme called CK was mildly elevated at 206, upper limit of normal is 182, the increase in CK level may be explained by her recent falls.  In addition, her vitamin B6 level was elevated, most likely this is because she is taking a supplement.  I would for now hold taking extra B6 supplementation.  Otherwise, labs were normal, thx.

## 2019-12-04 NOTE — Telephone Encounter (Signed)
I reached out to the pt and left a vm ( ok per dpr) advising of results. Pt was advised to call back if she had any questions.

## 2019-12-06 ENCOUNTER — Ambulatory Visit: Payer: Medicare PPO | Admitting: Internal Medicine

## 2019-12-06 ENCOUNTER — Other Ambulatory Visit: Payer: Self-pay

## 2019-12-06 VITALS — BP 140/72 | HR 64 | Temp 97.4°F | Resp 16 | Ht 61.5 in | Wt 166.0 lb

## 2019-12-06 DIAGNOSIS — M069 Rheumatoid arthritis, unspecified: Secondary | ICD-10-CM

## 2019-12-06 DIAGNOSIS — Z79899 Other long term (current) drug therapy: Secondary | ICD-10-CM

## 2019-12-06 DIAGNOSIS — R7309 Other abnormal glucose: Secondary | ICD-10-CM

## 2019-12-06 DIAGNOSIS — E039 Hypothyroidism, unspecified: Secondary | ICD-10-CM | POA: Diagnosis not present

## 2019-12-06 DIAGNOSIS — I10 Essential (primary) hypertension: Secondary | ICD-10-CM | POA: Diagnosis not present

## 2019-12-06 DIAGNOSIS — E559 Vitamin D deficiency, unspecified: Secondary | ICD-10-CM

## 2019-12-06 DIAGNOSIS — E782 Mixed hyperlipidemia: Secondary | ICD-10-CM

## 2019-12-06 DIAGNOSIS — G629 Polyneuropathy, unspecified: Secondary | ICD-10-CM | POA: Diagnosis not present

## 2019-12-06 MED ORDER — GABAPENTIN 600 MG PO TABS: ORAL_TABLET | ORAL | 1 refills | Status: DC

## 2019-12-06 NOTE — Patient Instructions (Signed)

## 2019-12-06 NOTE — Progress Notes (Signed)
History of Present Illness:      This very nice 74 y.o. MWF presents for 6 month follow up with HTN, HLD, Rheumatoid Arthritis (1985),  Pre-Diabetes and Vitamin D Deficiency. Patient has GERD controlled on her meds. Patient is followed by Dr Lenna Gilford  for her Rheumatoid Arthritis.       Patient is treated for HTN (1997) & BP has been controlled at home. Today's BP is at goal -  140/72.  Patient has been evaluated recently for syncopal episodes. Patient is also being evaluated by Neurology . Patient has had no complaints of any cardiac type chest pain, palpitations, dyspnea / orthopnea / PND,  claudication, or dependent edema.      Hyperlipidemia is not  controlled with diet & meds. Patient denies myalgias or other med SE's. Last Lipids were not at goal:  Lab Results  Component Value Date   CHOL 210 (H) 09/03/2019   HDL 73 09/03/2019   LDLCALC 119 (H) 09/03/2019   TRIG 79 09/03/2019   CHOLHDL 2.9 09/03/2019        Also, the patient has history of PreDiabetes (A1c 5.8% / 2011)  and has had no symptoms of reactive hypoglycemia, diabetic polys, paresthesias or visual blurring.  Last A1c was Normal & at goal:  Lab Results  Component Value Date   HGBA1C 5.3 11/26/2019                                         Patient was dx'd Hypothyroid circa 95's and initiated on  thyroid replacement.      Further, the patient also has history of Vitamin D Deficiency ("38" / 2008)   and supplements vitamin D without any suspected side-effects. Last vitamin D was at goal:  Lab Results  Component Value Date   VD25OH 92 05/31/2019    Current Outpatient Medications on File Prior to Visit  Medication Sig  . ALPRAZolam (XANAX) 1 MG tablet Take 1/2 to 1 tablet 1 to 2 x / day only if needed for Acute Anxiety Attack and please try to limit to 5 days /week to avoid addiction  . Ascorbic Acid (VITAMIN C) 1000 MG tablet Take 1,000 mg by mouth daily.  Marland Kitchen aspirin 81 MG tablet Take 81 mg by mouth daily.  Marland Kitchen b  complex vitamins capsule Take 1 capsule by mouth daily.  Marland Kitchen BIOTIN PO Take 1 tablet by mouth daily.  . Cholecalciferol (VITAMIN D PO) Take 5,000 Units by mouth daily.   . folic acid (FOLVITE) 1 MG tablet Take 3 mg by mouth daily.   Marland Kitchen gabapentin (NEURONTIN) 100 MG capsule Take 1 capsule 3 x /day for Chronic Pain  . GLUCOSAMINE-CHONDROITIN PO Take 1 tablet by mouth daily.  . hydroxychloroquine (PLAQUENIL) 200 MG tablet Take by mouth daily. Take 1 tab AM and PM  . Ibuprofen-Acetaminophen (ADVIL DUAL ACTION PO) Take by mouth. Takes PRN for pain.  Marland Kitchen levothyroxine (SYNTHROID) 112 MCG tablet Take 1 tablet daily on an empty stomach with only water for 30 minutes & no Antacid meds, Calcium or Magnesium for 4 hours & avoid Biotin  . losartan (COZAAR) 25 MG tablet Take 1 tablet (25 mg total) by mouth daily.  . Magnesium 500 MG TABS Take 1 tablet by mouth daily.  Marland Kitchen MELATONIN PO Take 1 tablet by mouth at bedtime.  . methotrexate (RHEUMATREX) 2.5 MG tablet  Take 2.5 mg by mouth once a week. Patient injects 2.5 mg /ml 1 ml [place] weekly.  Marland Kitchen omeprazole (PRILOSEC) 40 MG capsule TAKE 1 CAPSULE TWICE DAILY FOR ACID REFLUX  . OVER THE COUNTER MEDICATION Takes hair, skin and nails 1 time daily.  . Probiotic CAPS Take 1 capsule by mouth daily.  Marland Kitchen zinc gluconate 50 MG tablet Take 50 mg by mouth daily.   No current facility-administered medications on file prior to visit.    Allergies  Allergen Reactions  . Ace Inhibitors Cough  . Ambien [Zolpidem] Other (See Comments)    Odd Feeling  . Crestor [Rosuvastatin]   . Pravastatin   . Prozac [Fluoxetine Hcl] Other (See Comments)    Decreased libido  . Zoloft [Sertraline Hcl]     PMHx:   Past Medical History:  Diagnosis Date  . Anemia    Hx of   . Arthritis   . Calculus of bile duct without mention of cholecystitis or obstruction   . Cataract   . GERD (gastroesophageal reflux disease)   . H. pylori infection    Hx of   . Heart murmur   . Hyperlipidemia   .  Hypertension   . Hypothyroid   . PUD (peptic ulcer disease)   . Rheumatoid arthritis(714.0)    Immunization History  Administered Date(s) Administered  . Influenza, High Dose Seasonal PF 07/16/2014, 09/17/2015, 07/01/2016, 08/04/2017, 08/25/2018  . Pneumococcal Conjugate-13 10/16/2015  . Pneumococcal Polysaccharide-23 11/08/2012  . Td 11/08/2012  . Zoster 09/17/2015   Past Surgical History:  Procedure Laterality Date  . AV FISTULA REPAIR    . BLADDER SUSPENSION    . BREAST CYST EXCISION Left   . BUNIONECTOMY    . CARPAL TUNNEL RELEASE    . CHOLECYSTECTOMY  09/07/2011  . CYSTECTOMY     left breast  . ERCP  09/29/2011   Procedure: ENDOSCOPIC RETROGRADE CHOLANGIOPANCREATOGRAPHY (ERCP);  Surgeon: Inda Castle, MD;  Location: [facility];  Service: Endoscopy;  Laterality: N/A;  . PILONIDAL CYST EXCISION    . TENOLYSIS  10/26/2011   Procedure: TENDON SHEATH RELEASE/TENOLYSIS;  Surgeon: Wynonia Sours, MD;  Location: [facility];  Service: Orthopedics;  Laterality: Right;  tenosynovectomy removal superficialis slip right index finger  . TONSILLECTOMY    . TOTAL SHOULDER REPLACEMENT Right 02/22/2014   Dr. Enrigue Catena at [facility]  . TRIGGER FINGER RELEASE    . VAGINAL HYSTERECTOMY     ovaries not removed    FHx:    Reviewed / unchanged  SHx:    Reviewed / unchanged   Systems Review:  Constitutional: Denies fever, chills, wt changes, headaches, insomnia, fatigue, night sweats, change in appetite. Eyes: Denies redness, blurred vision, diplopia, discharge, itchy, watery eyes.  ENT: Denies discharge, congestion, post nasal drip, epistaxis, sore throat, earache, hearing loss, dental pain, tinnitus, vertigo, sinus pain, snoring.  CV: Denies chest pain, palpitations, irregular heartbeat, syncope, dyspnea, diaphoresis, orthopnea, PND, claudication or edema. Respiratory: denies cough, dyspnea, DOE, pleurisy, hoarseness, laryngitis, wheezing.  Gastrointestinal: Denies  dysphagia, odynophagia, heartburn, reflux, water brash, abdominal pain or cramps, nausea, vomiting, bloating, diarrhea, constipation, hematemesis, melena, hematochezia  or hemorrhoids. Genitourinary: Denies dysuria, frequency, urgency, nocturia, hesitancy, discharge, hematuria or flank pain. Musculoskeletal: Denies arthralgias, myalgias, stiffness, jt. swelling, pain, limping or strain/sprain.  Skin: Denies pruritus, rash, hives, warts, acne, eczema or change in skin lesion(s). Neuro: No weakness, tremor, incoordination, spasms, paresthesia or pain. Psychiatric: Denies confusion, memory loss or sensory loss. Endo: Denies change in weight,  skin or hair change.  Heme/Lymph: No excessive bleeding, bruising or enlarged lymph nodes.  Physical Exam  BP 140/72   Pulse 64   Temp (!) 97.4 F (36.3 C)   Resp 16   Ht 5' 1.5" (1.562 m)   Wt 166 lb (75.3 kg)   BMI 30.86 kg/m   Appears  well nourished, well groomed  and in no distress.  Eyes: PERRLA, EOMs, conjunctiva no swelling or erythema. Sinuses: No frontal/maxillary tenderness ENT/Mouth: EAC's clear, TM's nl w/o erythema, bulging. Nares clear w/o erythema, swelling, exudates. Oropharynx clear without erythema or exudates. Oral hygiene is good. Tongue normal, non obstructing. Hearing intact.  Neck: Supple. Thyroid not palpable. Car 2+/2+ without bruits, nodes or JVD. Chest: Respirations nl with BS clear & equal w/o rales, rhonchi, wheezing or stridor.  Cor: Heart sounds normal w/ regular rate and rhythm without sig. murmurs, gallops, clicks or rubs. Peripheral pulses normal and equal  without edema.  Abdomen: Soft & bowel sounds normal. Non-tender w/o guarding, rebound, hernias, masses or organomegaly.  Lymphatics: Unremarkable.  Musculoskeletal: Full ROM all peripheral extremities, joint stability, 5/5 strength and normal gait.  Skin: Warm, dry without exposed rashes, lesions or ecchymosis apparent.  Neuro: Cranial nerves intact, reflexes  equal bilaterally. Sensory-motor testing grossly intact. Tendon reflexes grossly intact.  Pysch: Alert & oriented x 3.  Insight and judgement nl & appropriate. No ideations.  Assessment and Plan:  1. Essential hypertension  - Continue medication, monitor blood pressure at home.  - Continue DASH diet.  Reminder to go to the ER if any CP,  SOB, nausea, dizziness, severe HA, changes vision/speech.  2. Hyperlipidemia, mixed  - Continue diet/meds, exercise,& lifestyle modifications.  - Continue monitor periodic cholesterol/liver & renal functions   - Lipid panel  3. Abnormal glucose  - Continue diet, exercise  - Lifestyle modifications.  - Monitor appropriate labs.  4. Vitamin D deficiency  - Continue supplementation.  - VITAMIN D 25 Hydroxy (Vit-D Deficiency, Fractures)  5. Hypothyroidism, unspecified type   6. Rheumatoid arthritis involving multiple sites  ([place])   7. Neuropathy   8. Medication management  - Lipid panel - Insulin, random - VITAMIN D 25 Hydroxy        Discussed  regular exercise, BP monitoring, weight control to achieve/maintain BMI less than 25 and discussed med and SE's. Recommended labs to assess and monitor clinical status with further disposition pending results of labs.  I discussed the assessment and treatment plan with the patient. The patient was provided an opportunity to ask questions and all were answered. The patient agreed with the plan and demonstrated an understanding of the instructions.  I provided over 30 minutes of exam, counseling, chart review and  complex critical decision making.  Kirtland Bouchard, MD

## 2019-12-07 LAB — INSULIN, RANDOM: Insulin: 3.9 u[IU]/mL

## 2019-12-07 LAB — LIPID PANEL
Cholesterol: 199 mg/dL (ref <200)
HDL: 68 mg/dL (ref >=50)
LDL Cholesterol (Calc): 110 mg/dL (calc) — ABNORMAL HIGH
Non-HDL Cholesterol (Calc): 131 mg/dL (calc) — ABNORMAL HIGH (ref <130)
Total CHOL/HDL Ratio: 2.9 (calc) (ref <5.0)
Triglycerides: 103 mg/dL (ref <150)

## 2019-12-07 LAB — VITAMIN D 25 HYDROXY (VIT D DEFICIENCY, FRACTURES): Vit D, 25-Hydroxy: 85 ng/mL (ref 30–100)

## 2019-12-08 ENCOUNTER — Encounter: Payer: Self-pay | Admitting: Internal Medicine

## 2019-12-13 NOTE — Progress Notes (Signed)
Please call and advise the patient that the EEG or brain wave test we performed was reported as normal in the awake and drowsy states. We checked for abnormal electrical discharges in the brain waves and the report suggested normal findings. No further action is required on this test at this time. Please remind patient to keep any upcoming appointments or tests and to call us with any interim questions, concerns, problems or updates. Thanks,  Drue Camera, MD, PhD  

## 2019-12-13 NOTE — Procedures (Signed)
   [facility]  EEG (ELECTROENCEPHALOGRAM) REPORT   STUDY DATE: 12/03/19 PATIENT NAME: [name] DOB: 03-11-1946 MRN: [id number]  ORDERING CLINICIAN: Star Age, MD PhD   TECHNOLOGIST: Babs Bertin TECHNIQUE: Electroencephalogram was recorded utilizing standard 10-20 system of lead placement and reformatted into average and bipolar montages.  RECORDING TIME: 26 minutes  ACTIVATION: hyperventilation and photic stimulation  CLINICAL INFORMATION: 74 year old female with loss of consciousness.  FINDINGS: Posterior dominant background rhythms, which attenuate with eye opening, ranging 10-11 hertz and 15-20 microvolts. No focal, lateralizing, epileptiform activity or seizures are seen. Patient recorded in the awake and drowsy states. EKG channel shows regular rhythm of 75-80 beats per minute.   IMPRESSION:   Normal EEG in the awake and drowsy states.     INTERPRETING PHYSICIAN:  Penni Bombard, MD Certified in Neurology, Neurophysiology and Neuroimaging  [facility] [address] [place], [place] [address] [phone]

## 2019-12-17 ENCOUNTER — Telehealth: Payer: Self-pay

## 2019-12-17 DIAGNOSIS — M0609 Rheumatoid arthritis without rheumatoid factor, multiple sites: Secondary | ICD-10-CM | POA: Diagnosis not present

## 2019-12-17 DIAGNOSIS — Z6830 Body mass index (BMI) 30.0-30.9, adult: Secondary | ICD-10-CM | POA: Diagnosis not present

## 2019-12-17 DIAGNOSIS — M150 Primary generalized (osteo)arthritis: Secondary | ICD-10-CM | POA: Diagnosis not present

## 2019-12-17 DIAGNOSIS — Z79899 Other long term (current) drug therapy: Secondary | ICD-10-CM | POA: Diagnosis not present

## 2019-12-17 DIAGNOSIS — M2550 Pain in unspecified joint: Secondary | ICD-10-CM | POA: Diagnosis not present

## 2019-12-17 DIAGNOSIS — E669 Obesity, unspecified: Secondary | ICD-10-CM | POA: Diagnosis not present

## 2019-12-17 NOTE — Telephone Encounter (Signed)
I reached out to the pt and advised of result. Pt verbalized understanding and had no questions/concerns at this time.

## 2019-12-17 NOTE — Telephone Encounter (Signed)
-----   Message from Star Age, MD sent at 12/13/2019  6:17 PM EST ----- Please call and advise the patient that the EEG or brain wave test we performed was reported as normal in the awake and drowsy states. We checked for abnormal electrical discharges in the brain waves and the report suggested normal findings. No further action is required on this test at this time. Please remind patient to keep any upcoming appointments or tests and to call us with any interim questions, concerns, problems or updates. Thanks,  Star Age, MD, PhD

## 2019-12-31 ENCOUNTER — Telehealth: Payer: Self-pay | Admitting: Neurology

## 2019-12-31 NOTE — Telephone Encounter (Signed)
The order was sent to [facility] they will reach out to the patient to schedule.

## 2019-12-31 NOTE — Telephone Encounter (Signed)
There is no problem in pursuing her EMG and nerve conduction velocity test here.  For her MRI and regarding implants in her mouth, please advise patient to let the radiology department know that she has these implants. If there are any removable parts, she will be asked to remove them.  Please ask her to make sure by calling the radiology department where she is scheduled for her MRI to make them aware.

## 2019-12-31 NOTE — Telephone Encounter (Signed)
I reached out to the pt and advised of Dr. Guadelupe Sabin advise. Pt verbalized understanding. After reviewing this information with the pt. Pt was advised the MRI that was ordered from 11/26/2019 has not been scheduled yet.   Pt wanted to confirm her total shoulder replacement from 2015 would not be an contraindication for this MRI order. I will send a message to our MRI coordinator to further investigate.

## 2019-12-31 NOTE — Telephone Encounter (Signed)
Pt states that re: her NCV/EMG and MRI that she has coming up this week she wanted it known that she has had a total shoulder replacement and implants in her mouth.  Pt wants to know if either of these will prevent her from having her test done, please call.

## 2020-01-03 ENCOUNTER — Other Ambulatory Visit: Payer: Self-pay

## 2020-01-03 ENCOUNTER — Ambulatory Visit (INDEPENDENT_AMBULATORY_CARE_PROVIDER_SITE_OTHER): Payer: Medicare PPO | Admitting: Neurology

## 2020-01-03 ENCOUNTER — Ambulatory Visit: Payer: Medicare PPO | Admitting: Neurology

## 2020-01-03 DIAGNOSIS — R296 Repeated falls: Secondary | ICD-10-CM | POA: Diagnosis not present

## 2020-01-03 DIAGNOSIS — G609 Hereditary and idiopathic neuropathy, unspecified: Secondary | ICD-10-CM | POA: Diagnosis not present

## 2020-01-03 DIAGNOSIS — R202 Paresthesia of skin: Secondary | ICD-10-CM

## 2020-01-03 DIAGNOSIS — R200 Anesthesia of skin: Secondary | ICD-10-CM

## 2020-01-03 DIAGNOSIS — R55 Syncope and collapse: Secondary | ICD-10-CM

## 2020-01-03 DIAGNOSIS — Z0289 Encounter for other administrative examinations: Secondary | ICD-10-CM

## 2020-01-03 NOTE — Procedures (Signed)
Full Name: [name] Gender: Female MRN #: [id number] Date of Birth: [birth year]    Visit Date: 01/03/2020 08:16 Age: 74 Years Examining Physician: Sarina Ill, MD  Referring Physician: Star Age, MD    History: 74 year old patient here for evaluation of symmetrical burning and numbness in the feet. Denies any low back pain or radicular symptoms.  Summary: EMG/NCS was performed on the lower extremities.  The right superficial peroneal sensory nerve showed decreased amplitude (3 V, normal greater than 6). The left superficial peroneal sensory nerve showed decreased amplitude (2 V, normal greater than 6). All remaining nerves (as indicated in the following tables) were within normal limits. All muscles (as indicated in the following tables) were within normal limits.    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   Sarina Ill M.D.  [facility] [place], [place] [address] Tel: [phone] Fax: [phone]       [facility]    Nerve / Sites Muscle Latency Ref. Amplitude Ref. Rel Amp Segments Distance Velocity Ref. Area    ms ms mV mV %  cm m/s m/s mVms  R Peroneal - EDB     Ankle EDB 4.5 ?6.5 4.1 ?2.0 100 Ankle - EDB 9   10.8     Fib head EDB 9.9  3.6  86.5 Fib head - Ankle 27 50 ?44 9.8     Pop fossa EDB 12.1  3.5  97.8 Pop fossa - Fib head 10 46 ?44 9.6         Pop fossa - Ankle      L Peroneal - EDB     Ankle EDB 5.0 ?6.5 3.0 ?2.0 100 Ankle - EDB 9   10.8     Fib head EDB 10.5  2.8  93.1 Fib head - Ankle 27 49 ?44 11.3     Pop fossa EDB 12.6  2.7  96.9 Pop fossa - Fib head 10 47 ?44 10.3         Pop fossa - Ankle      R Tibial - AH     Ankle AH 3.7 ?5.8 7.8 ?4.0 100 Ankle - AH 9   16.3     Pop fossa AH 12.5  6.9  88 Pop fossa - Ankle 36 41 ?41 15.7  L Tibial - AH     Ankle AH 3.8 ?5.8 5.7 ?4.0 100 Ankle - AH 9   10.5     Pop fossa AH 12.1  4.8  84.1 Pop fossa - Ankle 35 42  ?41 9.7             SNC    Nerve / Sites Rec. Site Peak Lat Ref.  Amp Ref. Segments Distance    ms ms V V  cm  R Sural - Ankle (Calf)     Calf Ankle 3.5 ?4.4 6 ?6 Calf - Ankle 14  L Sural - Ankle (Calf)     Calf Ankle 3.5 ?4.4 6 ?6 Calf - Ankle 14  R Superficial peroneal - Ankle     Lat leg Ankle 4.1 ?4.4 3 ?6 Lat leg - Ankle 14  L Superficial peroneal - Ankle     Lat leg Ankle 4.0 ?4.4 2 ?6 Lat leg - Ankle 14             F  Wave    Nerve F Lat Ref.   ms ms  R  Tibial - AH 51.4 ?56.0  L Tibial - AH 53.1 ?56.0         EMG Summary Table    Spontaneous MUAP Recruitment  Muscle IA Fib PSW Fasc Other Amp Dur. Poly Pattern  R. Vastus medialis Normal None None None _______ Normal Normal Normal Normal  R. Tibialis anterior Normal None None None _______ Normal Normal Normal Normal  L. Tibialis anterior Normal None None None _______ Normal Normal Normal Normal  L. Vastus medialis Normal None None None _______ Normal Normal Normal Normal  L. Gastrocnemius (Medial head) Normal None None None _______ Normal Normal Normal Normal  R. Gastrocnemius (Medial head) Normal None None None _______ Normal Normal Normal Normal  L. Extensor hallucis longus Normal None None None _______ Normal Normal Normal Normal  R. Extensor hallucis longus Normal None None None _______ Normal Normal Normal Normal  L. Abductor hallucis Normal None None None _______ Normal Normal Normal Normal  R. Abductor hallucis Normal None None None _______ Normal Normal Normal Normal

## 2020-01-03 NOTE — Progress Notes (Signed)
Full Name: [name] Gender: Female MRN #: [id number] Date of Birth: 04-24-1946    Visit Date: 01/03/2020 08:16 Age: 74 Years Examining Physician: Sarina Ill, MD  Referring Physician: Star Age, MD    History: 74 year old patient here for evaluation of symmetrical burning and numbness in the feet. Denies any low back pain or radicular symptoms.  Summary: EMG/NCS was performed on the lower extremities.  The right superficial peroneal sensory nerve showed decreased amplitude (3 V, normal greater than 6). The left superficial peroneal sensory nerve showed decreased amplitude (2 V, normal greater than 6). All remaining nerves (as indicated in the following tables) were within normal limits. All muscles (as indicated in the following tables) were within normal limits.    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   Sarina Ill M.D.  [facility] [place], [place] [address] Tel: [phone] Fax: [phone]       [facility]    Nerve / Sites Muscle Latency Ref. Amplitude Ref. Rel Amp Segments Distance Velocity Ref. Area    ms ms mV mV %  cm m/s m/s mVms  R Peroneal - EDB     Ankle EDB 4.5 ?6.5 4.1 ?2.0 100 Ankle - EDB 9   10.8     Fib head EDB 9.9  3.6  86.5 Fib head - Ankle 27 50 ?44 9.8     Pop fossa EDB 12.1  3.5  97.8 Pop fossa - Fib head 10 46 ?44 9.6         Pop fossa - Ankle      L Peroneal - EDB     Ankle EDB 5.0 ?6.5 3.0 ?2.0 100 Ankle - EDB 9   10.8     Fib head EDB 10.5  2.8  93.1 Fib head - Ankle 27 49 ?44 11.3     Pop fossa EDB 12.6  2.7  96.9 Pop fossa - Fib head 10 47 ?44 10.3         Pop fossa - Ankle      R Tibial - AH     Ankle AH 3.7 ?5.8 7.8 ?4.0 100 Ankle - AH 9   16.3     Pop fossa AH 12.5  6.9  88 Pop fossa - Ankle 36 41 ?41 15.7  L Tibial - AH     Ankle AH 3.8 ?5.8 5.7 ?4.0 100 Ankle - AH 9   10.5     Pop fossa AH 12.1  4.8  84.1 Pop fossa - Ankle 35 42  ?41 9.7             SNC    Nerve / Sites Rec. Site Peak Lat Ref.  Amp Ref. Segments Distance    ms ms V V  cm  R Sural - Ankle (Calf)     Calf Ankle 3.5 ?4.4 6 ?6 Calf - Ankle 14  L Sural - Ankle (Calf)     Calf Ankle 3.5 ?4.4 6 ?6 Calf - Ankle 14  R Superficial peroneal - Ankle     Lat leg Ankle 4.1 ?4.4 3 ?6 Lat leg - Ankle 14  L Superficial peroneal - Ankle     Lat leg Ankle 4.0 ?4.4 2 ?6 Lat leg - Ankle 14             F  Wave    Nerve F Lat Ref.   ms ms  R  Tibial - AH 51.4 ?56.0  L Tibial - AH 53.1 ?56.0         EMG Summary Table    Spontaneous MUAP Recruitment  Muscle IA Fib PSW Fasc Other Amp Dur. Poly Pattern  R. Vastus medialis Normal None None None _______ Normal Normal Normal Normal  R. Tibialis anterior Normal None None None _______ Normal Normal Normal Normal  L. Tibialis anterior Normal None None None _______ Normal Normal Normal Normal  L. Vastus medialis Normal None None None _______ Normal Normal Normal Normal  L. Gastrocnemius (Medial head) Normal None None None _______ Normal Normal Normal Normal  R. Gastrocnemius (Medial head) Normal None None None _______ Normal Normal Normal Normal  L. Extensor hallucis longus Normal None None None _______ Normal Normal Normal Normal  R. Extensor hallucis longus Normal None None None _______ Normal Normal Normal Normal  L. Abductor hallucis Normal None None None _______ Normal Normal Normal Normal  R. Abductor hallucis Normal None None None _______ Normal Normal Normal Normal

## 2020-01-03 NOTE — Progress Notes (Signed)
This is a lovely 74 year old patient here today for EMG nerve conduction study for sensory changes in the feet.  Patient reports that she has numbness in the toes and burning in the feet to the ankles.  The symptoms are symmetrical in her feet.  She says she has had it for many years.  She denies any distal weakness.  Today she also denies any low back pain or radicular symptoms.  I discussed findings of EMG nerve conduction study with patient, explained the difference between sensory nerves and motor nerves and what we were looking for on the muscle needle study.  I also explained that she has a mild sensory polyneuropathy, motor nerves intact, no changes on EMG needle study, but she does have decreased amplitude in both superficial peroneal sensory nerves.  I explained some of the treatable different causes of peripheral distal sensory polyneuropathy such as diabetes, B12 deficiency and several others however advised her to follow-up with her neurologist Dr. Star Age as needed.  Patient reports that she has been told for years she has peripheral neuropathy, she is very happy that this "proves it" as no one has really believed her.  She also says that gabapentin works well for her symptoms.  I also explained to patient some of the other disorders that EMG nerve conduction study is helpful in ruling out.  A total of 10 minutes was spent face to face with patient discussing peripheral neuropathy diagnosis and different diagnostic and therapeutic options, counseling and coordination of care, risks and benefits of management, compliance, or risk factor reduction and education.  This does not include time spent on emg/ncs.

## 2020-01-03 NOTE — Progress Notes (Signed)
See procedure note.

## 2020-01-18 ENCOUNTER — Ambulatory Visit
Admission: RE | Admit: 2020-01-18 | Discharge: 2020-01-18 | Disposition: A | Discharge status: Home / Self Care | Payer: Medicare PPO | Source: Ambulatory Visit | Attending: Neurology | Admitting: Neurology

## 2020-01-18 DIAGNOSIS — G609 Hereditary and idiopathic neuropathy, unspecified: Secondary | ICD-10-CM

## 2020-01-18 DIAGNOSIS — R296 Repeated falls: Secondary | ICD-10-CM | POA: Diagnosis not present

## 2020-01-18 DIAGNOSIS — R55 Syncope and collapse: Secondary | ICD-10-CM | POA: Diagnosis not present

## 2020-01-18 MED ORDER — GADOBENATE DIMEGLUMINE 529 MG/ML IV SOLN
15.0000 mL | Freq: Once | INTRAVENOUS | Status: AC | PRN
  Administered 2020-01-18: 15 mL via INTRAVENOUS

## 2020-01-22 ENCOUNTER — Telehealth: Payer: Self-pay

## 2020-01-22 NOTE — Telephone Encounter (Signed)
Her EMG nerve conduction velocity test in February showed evidence of mild neuropathy.  Dr. Jaynee Eagles talked to her about it and also talked to me about the results.  No obvious cause has been found for her neuropathy.  Patient has a longstanding history for years of peripheral neuropathy.  Findings were on the mild side, I would recommend that she talk to her primary care physician about her vitamin B6 supplementation and vitamin B12 supplementation.  Both levels were high.  I would definitely recommend that she not take any additional vitamin B6 at this time.

## 2020-01-22 NOTE — Telephone Encounter (Signed)
-----   Message from Star Age, MD sent at 01/22/2020  7:29 AM EDT ----- Please call patient regarding the recent brain MRI: The brain scan showed a normal structure of the brain and mild volume loss which we call atrophy. There were changes in the deeper structures of the brain, which we call white matter changes or microvascular changes. These were reported as mild in Her case. These are tiny white spots, that occur with time and are seen in a variety of conditions, including with normal aging, chronic hypertension, chronic headaches, especially migraine HAs, chronic diabetes, chronic hyperlipidemia. These are not strokes and no mass or lesion or contrast enhancement was seen which is reassuring. Again, there were no acute findings, such as a stroke, or mass or blood products. The findings are progressed compared to her MRI brain from 2012, which is not unusual.  She had degenerative changes noted on the upper portion of the neck. If she has neck pain, especially radiating pain to the arms, we can follow this up with a neck MRI. Her rheumatoid arthritis can affect her spine as well. Please let me know, if she would like to proceed with a c spine MRI and I will order it. No obvious cause for her falls on brain MRI. No further action is required on this test at this time, other than re-enforcing the importance of good blood pressure control, good cholesterol control, good blood sugar control, and weight management. Please remind patient to keep any upcoming appointments or tests and to call us with any interim questions, concerns, problems or updates. Thanks,  Star Age, MD, PhD

## 2020-01-22 NOTE — Telephone Encounter (Signed)
I called pt. I discussed these results and recommendations with her. She will discuss this further with her PCP. Pt verbalized understanding of results. Pt had no questions at this time but was encouraged to call back if questions arise.

## 2020-01-22 NOTE — Telephone Encounter (Signed)
I called pt and discussed her MRI results and recommendations with her. She has declined a c-spine MRI at this time. Pt verbalized understanding of results and recommendations.  She would like Dr. Rexene Alberts to review her EMG/NCV results from February and let her know what she thinks about those.

## 2020-02-28 ENCOUNTER — Telehealth: Payer: Medicare PPO | Admitting: Cardiology

## 2020-03-04 ENCOUNTER — Other Ambulatory Visit: Payer: Self-pay

## 2020-03-04 ENCOUNTER — Other Ambulatory Visit: Payer: Self-pay | Admitting: Podiatrist

## 2020-03-04 ENCOUNTER — Ambulatory Visit (INDEPENDENT_AMBULATORY_CARE_PROVIDER_SITE_OTHER): Payer: Medicare PPO

## 2020-03-04 ENCOUNTER — Ambulatory Visit: Payer: Medicare PPO | Admitting: Podiatrist

## 2020-03-04 ENCOUNTER — Encounter: Payer: Self-pay | Admitting: Podiatrist

## 2020-03-04 DIAGNOSIS — M79672 Pain in left foot: Secondary | ICD-10-CM

## 2020-03-04 DIAGNOSIS — M722 Plantar fascial fibromatosis: Secondary | ICD-10-CM

## 2020-03-04 DIAGNOSIS — M7750 Other enthesopathy of unspecified foot: Secondary | ICD-10-CM | POA: Diagnosis not present

## 2020-03-04 MED ORDER — METHYLPREDNISOLONE 4 MG PO TBPK: ORAL_TABLET | ORAL | 1 refills | Status: DC

## 2020-03-04 NOTE — Progress Notes (Signed)
  No chief complaint on file.    HPI: Patient is 74 y.o. female who presents today for pain on the left heel. She states it swells and it has been hurting for about 6 weeks.  She has tried voltaren gel with some relief.  Review of Systems No fevers, chills, nausea, muscle aches, no difficulty breathing, no calf pain, no chest pain or shortness of breath.   Physical Exam  GENERAL APPEARANCE: Alert, conversant. Appropriately groomed. No acute distress.   VASCULAR: Pedal pulses palpable DP and PT bilateral.  Capillary refill time is immediate to all digits,  Proximal to distal cooling it warm to warm.  Digital hair growth is present bilateral   NEUROLOGIC: sensation is intact epicritically and protectively to 5.07 monofilament at 5/5 sites bilateral.  Light touch is intact bilateral, vibratory sensation intact bilateral, achilles tendon reflex is intact bilateral.   MUSCULOSKELETAL: acceptable muscle strength, tone and stability bilateral.  Navicular prominence noted on the left.  Pain and some swelling on the posterior left heel in comparison with the right.  Swelling present on the lateral area near the peroneus brevis tendon under the lateral malleolus.  Warmth is also present in a horse shoe pattern along the posterior heel.  No pain, crepitus or limitation noted with foot and ankle range of motion at ankle bilateral.   DERMATOLOGIC: skin is warm, supple, and dry.  No open lesions noted.  No rash, no pre ulcerative lesions. Digital nails are asymptomatic.      Assessment   Tendinitis left foot  Plan  Recommended a steroid Dosepak to take and this was written for her.  She will continue the Voltaren gel.  She will call if the pain continues after the completion of the Dosepak.

## 2020-03-04 NOTE — Progress Notes (Signed)
FOLLOW UP Assessment & Plan:   Essential hypertension -     CBC with Differential/Platelet -     COMPLETE METABOLIC PANEL WITH GFR -     TSH - continue medications, DASH diet, exercise and monitor at home. Call if greater than 130/80.   Hyperlipidemia, mixed -     Lipid panel check lipids decrease fatty foods increase activity.   Medication management -     Magnesium  Hypothyroidism, unspecified type -     TSH Hypothyroidism-check TSH level, continue medications the same, reminded to take on an empty stomach 30-7mins before food.   Abnormal glucose Discussed disease progression and risks Discussed diet/exercise, weight management and risk modification  Vitamin D deficiency -     VITAMIN D 25 Hydroxy (Vit-D Deficiency, Fractures)  Hyperparathyroidism (HCC) Monitor  Aortic atherosclerosis (HCC) Control blood pressure, cholesterol, glucose, increase exercise.   Rheumatoid arthritis involving multiple sites, unspecified whether rheumatoid factor present ([place]) Continue follow up  Full incontinence of feces -     DG Lumbar Spine Complete; Future Follow up with GI Add on citracel  Chronic bilateral low back pain without sciatica -     DG Lumbar Spine Complete; Future ? Causing neuropathy Get Xray follow up with ortho Go to the ER if you have any new weakness in your legs, have trouble controlling your urine or bowels, or have worsening pain.   Depression, major, single episode, in partial remission ([place]) MONITOR    Subjective:    Patient ID: [name], female    DOB: [birth year], 74 y.o.   MRN: [id number]  HPI 74 y.o. former smoking (Q96) WF with history of HTN, HLD,history of pre-diabetes (abnormal glucose), overweight, vitamin D def, RA on MTX and plaquenil and vitamin D deficiency presents for follow up.  Of note she had a work up for syncopal episodes/dizziness in 2014 with negative holter that showed PVC/PAC with Dr. Mare Ferrari, normal stress test 2016  and normal MRI in 2012. She was taken off her HCTZ at that time. She was recently sent to see neurology, had a normal MRI brain, normal EEG and EMG showed neuropathy. She did not have a MRI cspine.  Her last fall was Jan 2021.  BP: 122/84 Stress test 2016 Dr. Stanford Breed  Nuclear stress EF: 64%.  The study is normal.  This is a low risk study.  The left ventricular ejection fraction is normal (55-65%). Never had CXR  Lab Results  Component Value Date   TSH 2.69 11/21/2019   Colonoscopy and EGD in 2014  She is on gabapentin 100mg  1 in AM and 2 at night, xanax, losartan 25mg  and off biotin x 1 month.  She sees Dr. Trudie Reed regularly and states that her RA is well controlled on plaquenil and Methotrexate  She is on fosamax for osteoporosis, stopped and restarted ago in fall 2018.   she has a diagnosis of insomnia and is currently on melatonin 20mg , advil pm and takes 0.5 mg of xanax (has failed multiple agents in the past including trazodone), reports symptoms are well controlled on current regimen.  she has a diagnosis of GERD with esophagitis and hx of PUD/gastric ulcer for which she continues management by prilosec 40 mg. She will have soft stools, like soft serve, will also have fecal incontinence. Has the neuropathy. Will suggest she follow up with ortho, will get Xray lumbar, and suggest getting on fiber supplement to bulk stool and to follow up with GI. Colonoscopy was 2015.  BMI is Body mass index is 30.3 kg/m., she is working on diet and exercise. Wt Readings from Last 3 Encounters:  03/05/20 163 lb (73.9 kg)  12/06/19 166 lb (75.3 kg)  11/26/19 167 lb (75.8 kg)   Her blood pressure is checked at home. Has been taking 50 mg losartan.   BP Readings from Last 3 Encounters:  03/05/20 122/84  12/06/19 140/72  11/21/19 130/72   She does not workout.  She denies chest pain, shortness of breath, dizziness.   She is not on cholesterol medication and denies myalgias. Her  cholesterol is at goal. The cholesterol last visit was:   Lab Results  Component Value Date   CHOL 199 12/06/2019   HDL 68 12/06/2019   LDLCALC 110 (H) 12/06/2019   TRIG 103 12/06/2019   CHOLHDL 2.9 12/06/2019   Lab Results  Component Value Date   HGBA1C 5.3 11/26/2019   Patient is on Vitamin D supplement. Lab Results  Component Value Date   VD25OH 32 12/06/2019   She is on thyroid medication. Her medication was NOT changed last visit, 156mcg, 1 pill daily.  Patient denies nervousness, palpitations and weight changes. She is on biotin.  Lab Results  Component Value Date   TSH 2.69 11/21/2019    Blood pressure 122/84, pulse 73, temperature (!) 97.3 F (36.3 C), weight 163 lb (73.9 kg), SpO2 96 %.  Medications  Current Outpatient Medications (Endocrine & Metabolic):  .  levothyroxine (SYNTHROID) 112 MCG tablet, Take 1 tablet daily on an empty stomach with only water for 30 minutes & no Antacid meds, Calcium or Magnesium for 4 hours & avoid Biotin .  methylPREDNISolone (MEDROL DOSEPAK) 4 MG TBPK tablet, 6 day taper dose pack.  Follow instructions on package  Current Outpatient Medications (Cardiovascular):  .  losartan (COZAAR) 25 MG tablet, Take 1 tablet (25 mg total) by mouth daily.   Current Outpatient Medications (Analgesics):  .  aspirin 81 MG tablet, Take 81 mg by mouth daily. .  Ibuprofen-Acetaminophen (ADVIL DUAL ACTION PO), Take by mouth. Takes PRN for pain.  Current Outpatient Medications (Hematological):  .  folic acid (FOLVITE) 1 MG tablet, Take 3 mg by mouth daily.   Current Outpatient Medications (Other):  Marland Kitchen  ALPRAZolam (XANAX) 1 MG tablet, Take 1/2 to 1 tablet 1 to 2 x / day only if needed for Acute Anxiety Attack and please try to limit to 5 days /week to avoid addiction .  Ascorbic Acid (VITAMIN C) 1000 MG tablet, Take 1,000 mg by mouth daily. Marland Kitchen  BIOTIN PO, Take 1 tablet by mouth daily. .  Cholecalciferol (VITAMIN D PO), Take 5,000 Units by mouth daily.   Marland Kitchen  gabapentin (NEURONTIN) 600 MG tablet, Take 1/2 to 1 tablet 2 to 3 x /Daily as needed for Pain .  hydroxychloroquine (PLAQUENIL) 200 MG tablet, Take by mouth daily. Take 1 tab AM and PM .  Magnesium 500 MG TABS, Take 1 tablet by mouth daily. Marland Kitchen  MELATONIN PO, Take 1 tablet by mouth at bedtime. .  methotrexate (RHEUMATREX) 2.5 MG tablet, Take 2.5 mg by mouth once a week. Patient injects 2.5 mg /ml 1 ml [place] weekly. Marland Kitchen  omeprazole (PRILOSEC) 40 MG capsule, TAKE 1 CAPSULE TWICE DAILY FOR ACID REFLUX .  OVER THE COUNTER MEDICATION, Takes hair, skin and nails 1 time daily. .  Probiotic CAPS, Take 1 capsule by mouth daily. Marland Kitchen  zinc gluconate 50 MG tablet, Take 50 mg by mouth daily.  Problem list She  has Essential hypertension; Rheumatoid arthritis ([place]); Hyperlipidemia, mixed; Gastroesophageal reflux disease without esophagitis; Personal history of colonic polyps; Vitamin D deficiency; Abnormal glucose; Medication management; Hypothyroidism; Osteoporosis; Hyperparathyroidism ([place]); Overweight (BMI 25.0-29.9); Insomnia; Neuropathy; Aortic atherosclerosis ([place]); and Depression, major, single episode, in partial remission ([place]) on their problem list.  Allergies Allergies  Allergen Reactions  . Ace Inhibitors Cough  . Ambien [Zolpidem] Other (See Comments)    Odd Feeling  . Crestor [Rosuvastatin]   . Pravastatin   . Prozac [Fluoxetine Hcl] Other (See Comments)    Decreased libido  . 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 FAMILY HISTORY Her family history includes Breast cancer in her paternal aunt; Colon cancer in her  maternal grandfather; Heart attack in her brother and father; Lung cancer in her mother; Prostate cancer in her paternal grandfather; Stomach cancer in her father. SOCIAL HISTORY She  reports that she quit smoking about 24 years ago. She has never used smokeless tobacco. She reports current alcohol use. She reports that she does not use drugs.   Review of Systems     Objective:   Physical Exam General appearance: alert, no distress, WD/WN,  female HEENT: normocephalic, sclerae anicteric, TMs pearly, nares patent, no discharge or erythema, pharynx normal Oral cavity: MMM, no lesions Neck: supple, no lymphadenopathy, no thyromegaly, no masses Heart: RRR, normal S1, S2, 1/6 systolic murmur RSB Lungs: CTA bilaterally, no wheezes, rhonchi, or rales Abdomen: +bs, soft, nontender, non distended, no masses, no hepatomegaly, no splenomegaly Musculoskeletal:  no swelling, no obvious deformity Extremities: no edema, no cyanosis, no clubbing Pulses: 2+ symmetric, upper and lower extremities, normal cap refill Neurological: alert, oriented x 3, CN2-12 intact, tremor right thumb at rest, strength normal upper extremities and lower extremities, good finger to nose, DTRs 2+ throughout, decreased sensation bilateral feet and hands, + romberg, gait normal Psychiatric: normal affect, behavior normal, pleasant  Breast: defer Gyn: defer Rectal: defer    Vicie Mutters, PA-C 03/05/20

## 2020-03-04 NOTE — Patient Instructions (Signed)
I will call you in a steroid taper pack.  Take as directed and if you notice no improvement after its completion please call.

## 2020-03-05 ENCOUNTER — Encounter: Payer: Self-pay | Admitting: Physician Assistant

## 2020-03-05 ENCOUNTER — Ambulatory Visit
Admission: RE | Admit: 2020-03-05 | Discharge: 2020-03-05 | Disposition: A | Discharge status: Home / Self Care | Payer: Medicare PPO | Source: Ambulatory Visit | Attending: Physician Assistant | Admitting: Physician Assistant

## 2020-03-05 ENCOUNTER — Ambulatory Visit: Payer: Medicare PPO | Admitting: Physician Assistant

## 2020-03-05 VITALS — BP 122/84 | HR 73 | Temp 97.3°F | Wt 163.0 lb

## 2020-03-05 DIAGNOSIS — G8929 Other chronic pain: Secondary | ICD-10-CM

## 2020-03-05 DIAGNOSIS — E782 Mixed hyperlipidemia: Secondary | ICD-10-CM

## 2020-03-05 DIAGNOSIS — E559 Vitamin D deficiency, unspecified: Secondary | ICD-10-CM

## 2020-03-05 DIAGNOSIS — E213 Hyperparathyroidism, unspecified: Secondary | ICD-10-CM | POA: Diagnosis not present

## 2020-03-05 DIAGNOSIS — I10 Essential (primary) hypertension: Secondary | ICD-10-CM | POA: Diagnosis not present

## 2020-03-05 DIAGNOSIS — F324 Major depressive disorder, single episode, in partial remission: Secondary | ICD-10-CM

## 2020-03-05 DIAGNOSIS — M545 Low back pain: Secondary | ICD-10-CM | POA: Diagnosis not present

## 2020-03-05 DIAGNOSIS — Z79899 Other long term (current) drug therapy: Secondary | ICD-10-CM

## 2020-03-05 DIAGNOSIS — R159 Full incontinence of feces: Secondary | ICD-10-CM

## 2020-03-05 DIAGNOSIS — M069 Rheumatoid arthritis, unspecified: Secondary | ICD-10-CM

## 2020-03-05 DIAGNOSIS — R7309 Other abnormal glucose: Secondary | ICD-10-CM

## 2020-03-05 DIAGNOSIS — I700 Atherosclerosis of aorta: Secondary | ICD-10-CM | POA: Diagnosis not present

## 2020-03-05 DIAGNOSIS — E039 Hypothyroidism, unspecified: Secondary | ICD-10-CM

## 2020-03-05 NOTE — Patient Instructions (Addendum)
Stop the biotin or hair skin and nails 2 weeks before your next labs Set alarm on your phone.   Please add on a fiber supplement like citracel to help bulk up your stools, this may help get you more control Please get the lumbar xray or xray of your lower back and follow up with ortho  Please follow up with GI over your fecal incontinence.    Fecal Incontinence Fecal incontinence, also called accidental bowel leakage, is not being able to control your bowels. This condition happens because the nerves or muscles around the anus do not work the way they should. This affects their ability to hold stool (feces). What are the causes? This condition may be caused by:  Damage to the muscles at the end of the rectum (sphincter).  Damage to the nerves that control bowel movements.  Diarrhea.  Chronic constipation.  Pelvic floor dysfunction. This means the muscles in the pelvis do not work well.  Loss of bowel storage capacity. This occurs when the rectum can no longer stretch in size in order to store feces.  Inflammatory bowel disease (IBD), such as Crohn's disease.  Irritable bowel syndrome (IBS). What increases the risk? You are more likely to develop this condition if you:  Were born with bowels or a pelvis that did not form correctly.  Have had rectal surgery.  Have had radiation treatment for certain cancers.  Have been pregnant, had a vaginal delivery, or had surgery that damaged the pelvic floor muscles.  Had a complicated childbirth, spinal cord injury, or other trauma that caused nerve damage.  Have a condition that can affect nerve function, such as diabetes, Parkinson's disease, or multiple sclerosis.  Have a condition where the rectum drops down into the anus or vagina (prolapse).  Are 73 years of age or older. What are the signs or symptoms? The main symptom of this condition is not being able to control your bowels. You also might not be able to get to the  bathroom before a bowel movement. How is this diagnosed? This condition is diagnosed with a medical history and physical exam. You may also have other tests, including:  Blood tests.  Urine tests.  A rectal exam.  Ultrasound.  MRI.  Colonoscopy. This is an exam that looks at your large intestine (colon).  Anal manometry. This is a test that measures the strength of the anal sphincter.  Anal electromyogram (EMG). This is a test that uses small electrodes to check for nerve damage. How is this treated? Treatment for this condition depends on the cause and severity. Treatment may also focus on addressing any underlying causes of this condition. Treatment may include:  Medicines. This may include medicines to: ? Prevent diarrhea. ? Help with constipation (bulk-forming laxatives). ? Treat any underlying conditions.  Biofeedback therapy. This can help to retrain muscles that are affected.  Fiber supplements. These can help manage your bowel movements.  Nerve stimulation.  Injectable gel to promote tissue growth and better muscle control.  Surgery. You may need: ? Sphincter repair surgery. ? Diversion surgery. This procedure lets feces pass out of your body through a hole in your abdomen. Follow these instructions at home: Eating and drinking   Follow instructions from your health care provider about any eating or drinking restrictions. ? Work with a dietitian to come up with a healthy diet that will help you avoid the foods that can make your condition worse. ? Keep a diet diary to find out which  foods or drinks could be making your condition worse.  Drink enough fluid to keep your urine pale yellow. Lifestyle  Do not use any products that contain nicotine or tobacco, such as cigarettes and e-cigarettes. If you need help quitting, ask your health care provider. This may help your condition.  If you are overweight, talk with your health care provider about how to safely  lose weight. This may help your condition.  Increase your physical activity as told by your health care provider. This may help your condition. Always talk with your health care provider before starting a new exercise program.  Carry a change of clothes and supplies to clean up quickly if you have an episode of fetal incontinence.  Consider joining a fecal incontinence support group. You can find a support group online or in your local community. General instructions   Take over-the-counter and prescription medicines only as told by your health care provider. This includes any supplements.  Apply a moisture barrier, such as petroleum jelly, to your rectum. This protects the skin from irritation caused by ongoing leaking or diarrhea.  Tell your health care provider if you are upset or depressed about your condition.  Keep all follow-up visits as told by your health care provider. This is important. Where to find more information  International Foundation for Functional Gastrointestinal Disorders: iffgd.[place] [employer]: patients.gi.org Contact a health care provider if:  You have a fever.  You have redness, swelling, or pain around your rectum.  Your pain is getting worse or you lose feeling in your rectal area.  You have blood in your stool.  You feel sad or hopeless.  You avoid social or work situations. Get help right away if:  You stop having bowel movements.  You cannot eat or drink without vomiting.  You have rectal bleeding that does not stop.  You have severe pain that is getting worse.  You have symptoms of dehydration, including: ? Sleepiness or fatigue. ? Producing little or no urine, tears, or sweat. ? Dizziness. ? Dry mouth. ? Unusual irritability. ? Headache. ? Inability to think clearly. Summary  Fecal incontinence, also called accidental bowel leakage, is not being able to control your bowels. This condition happens  because the nerves or muscles around the anus do not work the way they should.  Treatment varies depending on the cause and severity of your condition. Treatment may also focus on addressing any underlying causes of this condition.  Follow instructions from your health care provider about any eating or drinking restrictions, lifestyle changes, and skin care.  Take over-the-counter and prescription medicines only as told by your health care provider. This includes any supplements.  Tell your health care provider if your symptoms worsen or if you are upset or depressed about your condition. This information is not intended to replace advice given to you by your health care provider. Make sure you discuss any questions you have with your health care provider. Document Revised: 03/09/2018 Document Reviewed: 03/09/2018 Elsevier Patient Education  [place].   Consider keeping a food diary-   FODMAP stands for fermentable oligo-, di-, mono-saccharides and polyols (1). These are the scientific terms used to classify groups of carbs that are notorious for triggering digestive symptoms like bloating, gas and stomach pain.   FODMAPs are found in a wide range of foods in varying amounts. Some foods contain just one type, while others contain several.  The main dietary sources of the four groups  of FODMAPs include:  Oligosaccharides: Wheat, rye, legumes and various fruits and vegetables, such as garlic and onions.  Disaccharides: Milk, yogurt and soft cheese. Lactose is the main carb.  Monosaccharides: Various fruit including figs and mangoes, and sweeteners such as honey and agave nectar. Fructose is the main carb.  Polyols: Certain fruits and vegetables including blackberries and lychee, as well as some low-calorie sweeteners like those in sugar-free gum.   Keep a food diary. This will help you identify foods that cause symptoms. Write down: ? What you eat and when. ? What symptoms  you have. ? When symptoms occur in relation to your meals.  Avoid foods that cause symptoms. Talk with your dietitian about other ways to get the same nutrients that are in these foods.  Eat your meals slowly, in a relaxed setting.  Aim to eat 5-6 small meals per day. Do not skip meals.  Drink enough fluids to keep your urine clear or pale yellow.  Ask your health care provider if you should take an over-the-counter probiotic during flare-ups to help restore healthy gut bacteria.  If you have cramping or diarrhea, try making your meals low in fat and high in carbohydrates. Examples of carbohydrates are pasta, rice, whole grain breads and cereals, fruits, and vegetables.  If dairy products cause your symptoms to flare up, try eating less of them. You might be able to handle yogurt better than other dairy products because it contains bacteria that help with digestion.

## 2020-03-06 ENCOUNTER — Other Ambulatory Visit: Payer: Self-pay | Admitting: Physician Assistant

## 2020-03-06 DIAGNOSIS — M5442 Lumbago with sciatica, left side: Secondary | ICD-10-CM

## 2020-03-06 DIAGNOSIS — R159 Full incontinence of feces: Secondary | ICD-10-CM

## 2020-03-06 DIAGNOSIS — G8929 Other chronic pain: Secondary | ICD-10-CM

## 2020-03-06 LAB — COMPLETE METABOLIC PANEL WITH GFR
AG Ratio: 2.6 (calc) — ABNORMAL HIGH (ref 1.0–2.5)
ALT: 25 U/L (ref 6–29)
AST: 30 U/L (ref 10–35)
Albumin: 4.6 g/dL (ref 3.6–5.1)
Alkaline phosphatase (APISO): 100 U/L (ref 37–153)
BUN: 15 mg/dL (ref 7–25)
CO2: 32 mmol/L (ref 20–32)
Calcium: 10.9 mg/dL — ABNORMAL HIGH (ref 8.6–10.4)
Chloride: 102 mmol/L (ref 98–110)
Creat: 0.74 mg/dL (ref 0.60–0.93)
GFR, Est African American: 94 mL/min/{1.73_m2} (ref >=60)
GFR, Est Non African American: 81 mL/min/{1.73_m2} (ref >=60)
Globulin: 1.8 g/dL (calc) — ABNORMAL LOW (ref 1.9–3.7)
Glucose, Bld: 120 mg/dL — ABNORMAL HIGH (ref 65–99)
Potassium: 4.8 mmol/L (ref 3.5–5.3)
Sodium: 138 mmol/L (ref 135–146)
Total Bilirubin: 0.5 mg/dL (ref 0.2–1.2)
Total Protein: 6.4 g/dL (ref 6.1–8.1)

## 2020-03-06 LAB — CBC WITH DIFFERENTIAL/PLATELET
Absolute Monocytes: 600 cells/uL (ref 200–950)
Basophils Absolute: 22 cells/uL (ref 0–200)
Basophils Relative: 0.2 %
Eosinophils Absolute: 0 cells/uL — ABNORMAL LOW (ref 15–500)
Eosinophils Relative: 0 %
HCT: 39.4 % (ref 35.0–45.0)
Hemoglobin: 12.8 g/dL (ref 11.7–15.5)
Lymphs Abs: 545 cells/uL — ABNORMAL LOW (ref 850–3900)
MCH: 30.4 pg (ref 27.0–33.0)
MCHC: 32.5 g/dL (ref 32.0–36.0)
MCV: 93.6 fL (ref 80.0–100.0)
MPV: 14.1 fL — ABNORMAL HIGH (ref 7.5–12.5)
Monocytes Relative: 5.5 %
Neutro Abs: 9734 cells/uL — ABNORMAL HIGH (ref 1500–7800)
Neutrophils Relative %: 89.3 %
Platelets: 159 10*3/uL (ref 140–400)
RBC: 4.21 10*6/uL (ref 3.80–5.10)
RDW: 13.4 % (ref 11.0–15.0)
Total Lymphocyte: 5 %
WBC: 10.9 10*3/uL — ABNORMAL HIGH (ref 3.8–10.8)

## 2020-03-06 LAB — LIPID PANEL
Cholesterol: 209 mg/dL — ABNORMAL HIGH (ref <200)
HDL: 86 mg/dL (ref >=50)
LDL Cholesterol (Calc): 110 mg/dL (calc) — ABNORMAL HIGH
Non-HDL Cholesterol (Calc): 123 mg/dL (calc) (ref <130)
Total CHOL/HDL Ratio: 2.4 (calc) (ref <5.0)
Triglycerides: 43 mg/dL (ref <150)

## 2020-03-06 LAB — TSH: TSH: 1.11 mIU/L (ref 0.40–4.50)

## 2020-03-06 LAB — MAGNESIUM: Magnesium: 2.3 mg/dL (ref 1.5–2.5)

## 2020-03-06 LAB — VITAMIN D 25 HYDROXY (VIT D DEFICIENCY, FRACTURES): Vit D, 25-Hydroxy: 83 ng/mL (ref 30–100)

## 2020-03-14 DIAGNOSIS — M0609 Rheumatoid arthritis without rheumatoid factor, multiple sites: Secondary | ICD-10-CM | POA: Diagnosis not present

## 2020-03-14 DIAGNOSIS — E663 Overweight: Secondary | ICD-10-CM | POA: Diagnosis not present

## 2020-03-14 DIAGNOSIS — M150 Primary generalized (osteo)arthritis: Secondary | ICD-10-CM | POA: Diagnosis not present

## 2020-03-14 DIAGNOSIS — M2550 Pain in unspecified joint: Secondary | ICD-10-CM | POA: Diagnosis not present

## 2020-03-14 DIAGNOSIS — Z6829 Body mass index (BMI) 29.0-29.9, adult: Secondary | ICD-10-CM | POA: Diagnosis not present

## 2020-03-14 DIAGNOSIS — Z79899 Other long term (current) drug therapy: Secondary | ICD-10-CM | POA: Diagnosis not present

## 2020-03-17 ENCOUNTER — Telehealth: Payer: Self-pay | Admitting: Podiatrist

## 2020-03-17 NOTE — Telephone Encounter (Signed)
Pt called stating that she did not feel like the predisone is strong enough  Would like to know what else can be done still having pain from inflamation please advise

## 2020-03-18 ENCOUNTER — Telehealth: Payer: Self-pay | Admitting: *Deleted

## 2020-03-18 MED ORDER — MELOXICAM 15 MG PO TABS
15.0000 mg | ORAL_TABLET | Freq: Every day | ORAL | 0 refills | Status: DC
Start: 2020-03-18 — End: 2020-07-08

## 2020-03-18 NOTE — Telephone Encounter (Signed)
Left message informing pt of Dr. Shaune Pollack orders and to make an appt if not improving.

## 2020-03-18 NOTE — Telephone Encounter (Signed)
-----   Message from Bronson Ing, DPM sent at 03/17/2020  3:01 PM EDT ----- Regarding: antiinflammatory Would you mind calling in mobic or diclofenac if she is willing to take an antiinflammatory.  Mobic 15 mg qd  Or diclofenac 50 mg bid to tid  Thanks!!!  E

## 2020-03-18 NOTE — Telephone Encounter (Signed)
Dr. Valentina Lucks answered through Staff Message 03/18/2020.

## 2020-03-26 ENCOUNTER — Other Ambulatory Visit: Payer: Self-pay

## 2020-03-26 ENCOUNTER — Ambulatory Visit: Payer: Medicare PPO | Admitting: Podiatrist

## 2020-03-26 DIAGNOSIS — M7752 Other enthesopathy of left foot: Secondary | ICD-10-CM

## 2020-03-26 DIAGNOSIS — M7750 Other enthesopathy of unspecified foot: Secondary | ICD-10-CM

## 2020-03-26 NOTE — Patient Instructions (Addendum)
Wear the boot while up and walking on the foot for the next 2-3 weeks as needed.  I have ordered a medication for you that will come from [facility] in [place]. They should be calling you to verify insurance and will mail the medication to you. If you live close by then you can go by their pharmacy to pick up the medication. Their phone number is [phone]. If you do not hear from them in the next few days, please give [place] a call at [phone].

## 2020-03-26 NOTE — Progress Notes (Signed)
   Chief Complaint  Patient presents with  . Foot Pain    pt is here for a f/u on left foot pain, pt states that her pain is often to the touch, pt also states that she is feeling worse since the last time she was here.     HPI: Patient is 74 y.o. female who presents today for follow up of left foot pain. The pain is localized to the posterior lateral foot and there is swelling involved.  Pain with standing or walking is reported.  Pain improves with sitting.  Pain is achy-  No burning pain reported.     Allergies  Allergen Reactions  . Ace Inhibitors Cough  . Ambien [Zolpidem] Other (See Comments)    Odd Feeling  . Crestor [Rosuvastatin]   . Pravastatin   . Prozac [Fluoxetine Hcl] Other (See Comments)    Decreased libido  . Zoloft [Sertraline Hcl]     Review of systems is reviewed and negative.   Physical Exam  Patient is awake, alert, and oriented x 3.  In no acute distress.    Vascular status is intact with palpable pedal pulses DP and PT bilateral and capillary refill time less than 3 seconds bilateral.  No edema or erythema noted.  Neurological exam reveals epicritic and protective sensation grossly intact bilateral.  Dermatological exam reveals skin is supple and dry to bilateral feet.  No open lesions present.   Musculoskeletal exam: Musculature intact with dorsiflexion, plantarflexion, inversion, eversion. Pain and swelling present posterior lateral ankle along the peroneal tendon shealth.  No palpable delve within the course of the peroneus brevis or longus tendon noted.     Assessment:   ICD-10-CM   1. Tendonitis of ankle or foot  M77.50      Plan: Recommended injecting near the area of pain on the lateral ankle and dispensing an air fracture walker for her use. The patient agreed and a sterile skin prep was applied.  An injection consisting of dexamethasone and marcaine mixture was infiltrated into the left posterior ankle area.  The patient tolerated this well  and was given instructions for aftercare and use of air fracture walker instructions.   A rx for pain and inflammation cream will be sent from [facility]  She will be seen back in 3 weeks for a recheck and will call if any concerns arise prior to this visit.

## 2020-04-01 ENCOUNTER — Encounter: Payer: Self-pay | Admitting: Podiatrist

## 2020-04-04 ENCOUNTER — Telehealth: Payer: Self-pay | Admitting: *Deleted

## 2020-04-04 MED ORDER — NONFORMULARY OR COMPOUNDED ITEM: 5 refills | Status: DC

## 2020-04-04 NOTE — Telephone Encounter (Signed)
-----   Message from Bronson Ing, DPM sent at 04/01/2020  1:56 PM EDT ----- Regarding: [facility] Hi Marcy Siren,'  I have another one that needs the pain cream for achilles tendonitis from [facility]  Thanks so much!!

## 2020-04-04 NOTE — Telephone Encounter (Signed)
Orders faxed to [facility] 

## 2020-04-21 ENCOUNTER — Ambulatory Visit: Payer: Medicare PPO | Admitting: Podiatrist

## 2020-06-05 DIAGNOSIS — H0014 Chalazion left upper eyelid: Secondary | ICD-10-CM | POA: Diagnosis not present

## 2020-06-17 DIAGNOSIS — H0014 Chalazion left upper eyelid: Secondary | ICD-10-CM | POA: Diagnosis not present

## 2020-06-25 DIAGNOSIS — M0609 Rheumatoid arthritis without rheumatoid factor, multiple sites: Secondary | ICD-10-CM | POA: Diagnosis not present

## 2020-06-25 DIAGNOSIS — Z6828 Body mass index (BMI) 28.0-28.9, adult: Secondary | ICD-10-CM | POA: Diagnosis not present

## 2020-06-25 DIAGNOSIS — Z79899 Other long term (current) drug therapy: Secondary | ICD-10-CM | POA: Diagnosis not present

## 2020-06-25 DIAGNOSIS — E663 Overweight: Secondary | ICD-10-CM | POA: Diagnosis not present

## 2020-06-25 DIAGNOSIS — M2550 Pain in unspecified joint: Secondary | ICD-10-CM | POA: Diagnosis not present

## 2020-06-25 DIAGNOSIS — M150 Primary generalized (osteo)arthritis: Secondary | ICD-10-CM | POA: Diagnosis not present

## 2020-07-01 ENCOUNTER — Other Ambulatory Visit: Payer: Self-pay | Admitting: Internal Medicine

## 2020-07-01 DIAGNOSIS — I10 Essential (primary) hypertension: Secondary | ICD-10-CM

## 2020-07-01 MED ORDER — LOSARTAN POTASSIUM 25 MG PO TABS: ORAL_TABLET | ORAL | 0 refills | Status: DC

## 2020-07-02 DIAGNOSIS — I10 Essential (primary) hypertension: Secondary | ICD-10-CM

## 2020-07-02 MED ORDER — LOSARTAN POTASSIUM 25 MG PO TABS: ORAL_TABLET | ORAL | 1 refills | Status: DC

## 2020-07-07 ENCOUNTER — Encounter: Payer: Self-pay | Admitting: Internal Medicine

## 2020-07-07 NOTE — Progress Notes (Signed)
Annual Screening/Preventative Visit & Comprehensive Evaluation &  Examination     This very nice 74 y.o.  MWF  presents for a Screening /Preventative Visit & comprehensive evaluation and management of multiple medical co-morbidities.  Patient has been followed for HTN, HLD, Prediabetes  and Vitamin D Deficiency. POatient wad dx'd with Rheumatoid Arthritis in 1985 and  is followed on MTX / Plaquenil by Dr Berna Bue.        HTN predates circa 1997. Patient's BP has been controlled at home and patient denies any cardiac symptoms as chest pain, palpitations, shortness of breath, dizziness or ankle swelling. Today's BP was initially elevated and rechecked at goal -136/80.       Patient's hyperlipidemia is not well controlled with diet. Last lipids were not at goal:  Lab Results  Component Value Date   CHOL 209 (H) 03/05/2020   HDL 86 03/05/2020   LDLCALC 110 (H) 03/05/2020   TRIG 43 03/05/2020   CHOLHDL 2.4 03/05/2020       Patient has hx/o prediabetes(A1c 5.8% / 2011) and patient denies reactive hypoglycemic symptoms, visual blurring, diabetic polys or paresthesias. Last A1c was  Normal & at goal:  Lab Results  Component Value Date   HGBA1C 5.3 11/26/2019       Patient was discovered Hypothyroid in the 1980's and has been on thyroid replacement.     Finally, patient has history of Vitamin D Deficiency ("38" / 2008) and last Vitamin D was at goal:  Lab Results  Component Value Date   VD25OH 83 03/05/2020    Current Outpatient Medications on File Prior to Visit  Medication Sig  . ALPRAZolam (XANAX) 1 MG tablet Take 1/2 to 1 tablet 1 to 2 x / day only if needed for Acute Anxiety Attack and please try to limit to 5 days /week to avoid addiction  . Ascorbic Acid (VITAMIN C) 1000 MG tablet Take 1,000 mg by mouth daily.  Marland Kitchen aspirin 81 MG tablet Take 81 mg by mouth daily.  . Cholecalciferol (VITAMIN D PO) Take 5,000 Units by mouth daily.   . folic acid (FOLVITE) 1 MG tablet Take 3  mg by mouth daily.   Marland Kitchen gabapentin (NEURONTIN) 600 MG tablet Take 1/2 to 1 tablet 2 to 3 x /Daily as needed for Pain  . hydroxychloroquine (PLAQUENIL) 200 MG tablet Take by mouth daily. Take 1 tab AM and PM  . IBU 800 MG tablet   . Ibuprofen-Acetaminophen (ADVIL DUAL ACTION PO) Take by mouth. Takes PRN for pain.  Marland Kitchen levothyroxine (SYNTHROID) 112 MCG tablet Take 1 tablet daily on an empty stomach with only water for 30 minutes & no Antacid meds, Calcium or Magnesium for 4 hours & avoid Biotin  . Magnesium 500 MG TABS Take 1 tablet by mouth daily.  Marland Kitchen MELATONIN PO Take 1 tablet by mouth at bedtime.  . methotrexate (RHEUMATREX) 2.5 MG tablet Take 2.5 mg by mouth once a week. Patient injects 2.5 mg /ml 1 ml [place] weekly.  Marland Kitchen omeprazole (PRILOSEC) 40 MG capsule TAKE 1 CAPSULE TWICE DAILY FOR ACID REFLUX  . OVER THE COUNTER MEDICATION Takes hair, skin and nails 1 time daily.  Marland Kitchen OVER THE COUNTER MEDICATION Apply voltaren gel to heel.  . Probiotic CAPS Take 1 capsule by mouth daily.  Marland Kitchen zinc gluconate 50 MG tablet Take 50 mg by mouth daily.  Marland Kitchen BIOTIN PO Take 1 tablet by mouth daily. (Patient not taking: Reported on 07/08/2020)  . meloxicam (MOBIC) 15 MG tablet  Take 1 tablet (15 mg total) by mouth daily. (Patient not taking: Reported on 07/08/2020)  . NONFORMULARY OR COMPOUNDED ITEM [facility]:  Achilles Tendonitis Cream - Diclofenac 3%, Baclofen 2%, Bupivacaine 1%, Gabapentin 6%, Ibuprofen 3%, Pentoxifylline 3%. Apply 1-2 grams to affected area 3-4 times daily. (Patient not taking: Reported on 07/08/2020)   No current facility-administered medications on file prior to visit.   Allergies  Allergen Reactions  . Ace Inhibitors Cough  . Ambien [Zolpidem] Other (See Comments)    Odd Feeling  . Crestor [Rosuvastatin]   . Pravastatin   . Prozac [Fluoxetine Hcl] Other (See Comments)    Decreased libido  . Zoloft [Sertraline Hcl]    Past Medical History:  Diagnosis Date  . Anemia    Hx of   .  Arthritis   . Calculus of bile duct without mention of cholecystitis or obstruction   . Cataract   . GERD (gastroesophageal reflux disease)   . H. pylori infection    Hx of   . Heart murmur   . Hyperlipidemia   . Hypertension   . Hypothyroid   . PUD (peptic ulcer disease)   . Rheumatoid arthritis(714.0)    Health Maintenance  Topic Date Due  . COVID-19 Vaccine (1) Never done  . COLONOSCOPY  03/08/2018  . INFLUENZA VACCINE  06/08/2020  . MAMMOGRAM  07/09/2021  . TETANUS/TDAP  11/08/2022  . DEXA SCAN  Completed  . Hepatitis C Screening  Completed  . PNA vac Low Risk Adult  Completed   Immunization History  Administered Date(s) Administered  . Influenza, High Dose Seasonal PF 07/16/2014, 09/17/2015, 07/01/2016, 08/04/2017, 08/25/2018  . Pneumococcal Conjugate-13 10/16/2015  . Pneumococcal Polysaccharide-23 11/08/2012  . Td 11/08/2012  . Zoster 09/17/2015    Last Colon - 03/08/2013 - Dr Deatra Ina - recc 10 yr f/u - due May 2024  Last MGM - 07/11/2019  Past Surgical History:  Procedure Laterality Date  . AV FISTULA REPAIR    . BLADDER SUSPENSION    . BREAST CYST EXCISION Left   . BUNIONECTOMY    . CARPAL TUNNEL RELEASE    . CHOLECYSTECTOMY  09/07/2011  . CYSTECTOMY     left breast  . ERCP  09/29/2011   Procedure: ENDOSCOPIC RETROGRADE CHOLANGIOPANCREATOGRAPHY (ERCP);  Surgeon: Inda Castle, MD;  Location: [facility];  Service: Endoscopy;  Laterality: N/A;  . PILONIDAL CYST EXCISION    . TENOLYSIS  10/26/2011   Procedure: TENDON SHEATH RELEASE/TENOLYSIS;  Surgeon: Wynonia Sours, MD;  Location: [facility];  Service: Orthopedics;  Laterality: Right;  tenosynovectomy removal superficialis slip right index finger  . TONSILLECTOMY    . TOTAL SHOULDER REPLACEMENT Right 02/22/2014   Dr. Enrigue Catena at [facility]  . TRIGGER FINGER RELEASE    . VAGINAL HYSTERECTOMY     ovaries not removed   Family History  Problem Relation Age of Onset  . Lung cancer Mother     . Stomach cancer Father   . Heart attack Father        MI at age 29  . Colon cancer Maternal Grandfather   . Heart attack Brother        MI at age 51  . Prostate cancer Paternal Grandfather   . Breast cancer Paternal Aunt    Social History   Tobacco Use  . Smoking status: Former Smoker    Quit date: 10/25/1995    Years since quitting: 24.7  . Smokeless tobacco: Never Used  . Tobacco comment: Maryann Conners  t1985  Substance Use Topics  . Alcohol use: Yes    Alcohol/week: 0.0 standard drinks    Comment: occasional  . Drug use: No    ROS Constitutional: Denies fever, chills, weight loss/gain, headaches, insomnia,  night sweats, and change in appetite. Does c/o fatigue. Eyes: Denies redness, blurred vision, diplopia, discharge, itchy, watery eyes.  ENT: Denies discharge, congestion, post nasal drip, epistaxis, sore throat, earache, hearing loss, dental pain, Tinnitus, Vertigo, Sinus pain, snoring.  Cardio: Denies chest pain, palpitations, irregular heartbeat, syncope, dyspnea, diaphoresis, orthopnea, PND, claudication, edema Respiratory: denies cough, dyspnea, DOE, pleurisy, hoarseness, laryngitis, wheezing.  Gastrointestinal: Denies dysphagia, heartburn, reflux, water brash, pain, cramps, nausea, vomiting, bloating, diarrhea, constipation, hematemesis, melena, hematochezia, jaundice, hemorrhoids Genitourinary: Denies dysuria, frequency, urgency, nocturia, hesitancy, discharge, hematuria, flank pain Breast: Breast lumps, nipple discharge, bleeding.  Musculoskeletal: Denies arthralgia, myalgia, stiffness, Jt. Swelling, pain, limp, and strain/sprain. Denies falls. Skin: Denies puritis, rash, hives, warts, acne, eczema, changing in skin lesion Neuro: No weakness, tremor, incoordination, spasms, paresthesia, pain Psychiatric: Denies confusion, memory loss, sensory loss. Denies Depression. Endocrine: Denies change in weight, skin, hair change, nocturia, and paresthesia, diabetic polys, visual  blurring, hyper / hypo glycemic episodes.  Heme/Lymph: No excessive bleeding, bruising, enlarged lymph nodes.  Physical Exam  BP 136/80   Pulse 68   Temp (!) 97.2 F (36.2 C)   Ht 5\' 1"  (1.549 m)   Wt 156 lb (70.8 kg)   SpO2 98%   BMI 29.48 kg/m   General Appearance: Well nourished, well groomed and in no apparent distress.  Eyes: PERRLA, EOMs, conjunctiva no swelling or erythema, normal fundi and vessels. Sinuses: No frontal/maxillary tenderness ENT/Mouth: EACs patent / TMs  nl. Nares clear without erythema, swelling, mucoid exudates. Oral hygiene is good. No erythema, swelling, or exudate. Tongue normal, non-obstructing. Tonsils not swollen or erythematous. Hearing normal.  Neck: Supple, thyroid not palpable. No bruits, nodes or JVD. Respiratory: Respiratory effort normal.  BS equal and clear bilateral without rales, rhonci, wheezing or stridor. Cardio: Heart sounds are normal with regular rate and rhythm and no murmurs, rubs or gallops. Peripheral pulses are normal and equal bilaterally without edema. No aortic or femoral bruits. Chest: symmetric with normal excursions and percussion. Breasts: Symmetric, without lumps, nipple discharge, retractions, or fibrocystic changes.  Abdomen: Flat, soft with bowel sounds active. Nontender, no guarding, rebound, hernias, masses, or organomegaly.  Lymphatics: Non tender without lymphadenopathy.  Genitourinary:  Musculoskeletal: Full ROM all peripheral extremities, joint stability, 5/5 strength, and normal gait. Skin: Warm and dry without rashes, lesions, cyanosis, clubbing or  ecchymosis.  Neuro: Cranial nerves intact, reflexes equal bilaterally. Normal muscle tone, no cerebellar symptoms. Sensation intact.  Pysch: Alert and oriented X 3, normal affect, Insight and Judgment appropriate.   Assessment and Plan  1. Annual Preventative Screening Examination   2. Essential hypertension  - EKG 12-Lead - Urinalysis, Routine w reflex  microscopic - Microalbumin / creatinine urine ratio - CBC with Differential/Platelet - COMPLETE METABOLIC PANEL WITH GFR - Magnesium - TSH  3. Hyperlipidemia, mixed  - EKG 12-Lead - Lipid panel - TSH  4. Abnormal glucose  - EKG 12-Lead - Hemoglobin A1c - Insulin, random  5. Vitamin D deficiency  - VITAMIN D 25 Hydroxy   6. Hypothyroidism  - TSH  7. Rheumatoid arthritis  ([place])   8. Gastroesophageal reflux disease   - CBC with Differential/Platelet  9. Screening for colorectal cancer  - POC Hemoccult Bld/Stl   10. Screening for ischemic heart disease  -  EKG 12-Lead  11. FHx: heart disease  - EKG 12-Lead  12. Former smoker  - EKG 12-Lead  13. Aortic atherosclerosis (HCC)  - EKG 12-Lead - Lipid panel  14. Medication management  - Urinalysis, Routine w reflex microscopic - Microalbumin / creatinine urine ratio - CBC with Differential/Platelet - COMPLETE METABOLIC PANEL WITH GFR - Magnesium - Lipid panel - TSH - Hemoglobin A1c - Insulin, random - VITAMIN D 25 Hydroxy         Patient was counseled in prudent diet to achieve/maintain BMI less than 25 for weight control, BP monitoring, regular exercise and medications. Discussed med's effects and SE's. Screening labs and tests as requested with regular follow-up as recommended. Over 40 minutes of exam, counseling, chart review and high complex critical decision making was performed.   Kirtland Bouchard, MD

## 2020-07-07 NOTE — Patient Instructions (Addendum)
Due to recent changes in healthcare laws, you may see the results of your imaging and laboratory studies on MyChart before your provider has had a chance to review them.  We understand that in some cases there may be results that are confusing or concerning to you. Not all laboratory results come back in the same time frame and the provider may be waiting for multiple results in order to interpret others.  Please give [place] 48 hours in order for your provider to thoroughly review all the results before contacting the office for clarification of your results.   ++++++++++++++++++++++++++++++ S T O P    B I O T I  N  ++++++++++++++++++++++++++++++  Vit D  & Vit C 1,000 mg   are recommended to help protect  against the Covid-19 and other Corona viruses.    Also it's recommended  to take  Zinc 50 mg  to help  protect against the Covid-19   and best place to get  is also on [email or web address]  and don't pay more than 6-8 cents /pill !  ================================ Coronavirus (COVID-19) Are you at risk?  Are you at risk for the Coronavirus (COVID-19)?  To be considered HIGH RISK for Coronavirus (COVID-19), you have to meet the following criteria:  . Traveled to [place], [place], [place], [place] or [place]; or in the [place] to [place], [place], [place]  . or [place]; and have fever, cough, and shortness of breath within the last 2 weeks of travel OR . Been in close contact with a person diagnosed with COVID-19 within the last 2 weeks and have  . fever, cough,and shortness of breath .  . IF YOU DO NOT MEET THESE CRITERIA, YOU ARE CONSIDERED LOW RISK FOR COVID-19.  What to do if you are HIGH RISK for COVID-19?  Marland Kitchen If you are having a medical emergency, call 911. . Seek medical care right away. Before you go to a doctor's office, urgent care or emergency department, .  call ahead and tell them about your recent travel, contact with someone diagnosed with COVID-19  .  and your  symptoms.  . You should receive instructions from your physician's office regarding next steps of care.  . When you arrive at healthcare provider, tell the healthcare staff immediately you have returned from  . visiting [place], [place], [place], [place] or [place]; or traveled in the [place] to [place], [place],  . [place] or [place] in the last two weeks or you have been in close contact with a person diagnosed with  . COVID-19 in the last 2 weeks.   . Tell the health care staff about your symptoms: fever, cough and shortness of breath. . After you have been seen by a medical provider, you will be either: o Tested for (COVID-19) and discharged home on quarantine except to seek medical care if  o symptoms worsen, and asked to  - Stay home and avoid contact with others until you get your results (4-5 days)  - Avoid travel on public transportation if possible (such as bus, train, or airplane) or o Sent to the Emergency Department by EMS for evaluation, COVID-19 testing  and  o possible admission depending on your condition and test results.  What to do if you are LOW RISK for COVID-19?  Reduce your risk of any infection by using the same precautions used for avoiding the common cold or flu:  Marland Kitchen Wash your hands often with soap  and warm water for at least 20 seconds.  If soap and water are not readily available,  . use an alcohol-based hand sanitizer with at least 60% alcohol.  . If coughing or sneezing, cover your mouth and nose by coughing or sneezing into the elbow areas of your shirt or coat, .  into a tissue or into your sleeve (not your hands). . Avoid shaking hands with others and consider head nods or verbal greetings only. . Avoid touching your eyes, nose, or mouth with unwashed hands.  . Avoid close contact with people who are sick. . Avoid places or events with large numbers of people in one location, like concerts or sporting events. . Carefully consider travel plans you  have or are making. . If you are planning any travel outside or inside the [place], visit the CDC's Travelers' Health webpage for the latest health notices. . If you have some symptoms but not all symptoms, continue to monitor at home and seek medical attention  . if your symptoms worsen. . If you are having a medical emergency, call 911.   . >>>>>>>>>>>>>>>>>>>>>>>>>>>>>>>>> . We Do NOT Approve of  [facility], [employer]  Our Patients  To Do Home Visits & We Do NOT Approve of [employer] > > > > > > > > > > > > > > > > > > > > > > > > > > > > > > > > > > > > > > >  Preventive Care for Adults  A healthy lifestyle and preventive care can promote health and wellness. Preventive health guidelines for women include the following key practices.  A routine yearly physical is a good way to check with your health care provider about your health and preventive screening. It is a chance to share any concerns and updates on your health and to receive a thorough exam.  Visit your dentist for a routine exam and preventive care every 6 months. Brush your teeth twice a day and floss once a day. Good oral hygiene prevents tooth decay and gum disease.  The frequency of eye exams is based on your age, health, family medical history, use of contact lenses, and other factors. Follow your health care provider's recommendations for frequency of eye exams.  Eat a healthy diet. Foods like vegetables, fruits, whole grains, low-fat dairy products, and lean protein foods contain the nutrients you need without too many calories. Decrease your intake of foods high in solid fats, added sugars, and salt. Eat the right amount of calories for you. Get information about a proper diet from your health care provider, if necessary.  Regular physical exercise is one of the most important things you can do for your health. Most adults should get at least 150 minutes of moderate-intensity exercise (any  activity that increases your heart rate and causes you to sweat) each week. In addition, most adults need muscle-strengthening exercises on 2 or more days a week.  Maintain a healthy weight. The body mass index (BMI) is a screening tool to identify possible weight problems. It provides an estimate of body fat based on height and weight. Your health care provider can find your BMI and can help you achieve or maintain a healthy weight. For adults 20 years and older:  A BMI below 18.5 is considered underweight.  A BMI of 18.5 to 24.9 is normal.  A BMI of 25 to 29.9 is considered overweight.  A BMI of 30 and above is considered  obese.  Maintain normal blood lipids and cholesterol levels by exercising and minimizing your intake of saturated fat. Eat a balanced diet with plenty of fruit and vegetables. If your lipid or cholesterol levels are high, you are over 50, or you are at high risk for heart disease, you may need your cholesterol levels checked more frequently. Ongoing high lipid and cholesterol levels should be treated with medicines if diet and exercise are not working.  If you smoke, find out from your health care provider how to quit. If you do not use tobacco, do not start.  Lung cancer screening is recommended for adults aged 73-80 years who are at high risk for developing lung cancer because of a history of smoking. A yearly low-dose CT scan of the lungs is recommended for people who have at least a 30-pack-year history of smoking and are a current smoker or have quit within the past 15 years. A pack year of smoking is smoking an average of 1 pack of cigarettes a day for 1 year (for example: 1 pack a day for 30 years or 2 packs a day for 15 years). Yearly screening should continue until the smoker has stopped smoking for at least 15 years. Yearly screening should be stopped for people who develop a health problem that would prevent them from having lung cancer treatment.  Avoid use of street  drugs. Do not share needles with anyone. Ask for help if you need support or instructions about stopping the use of drugs.  High blood pressure causes heart disease and increases the risk of stroke.  Ongoing high blood pressure should be treated with medicines if weight loss and exercise do not work.  If you are 1-37 years old, ask your health care provider if you should take aspirin to prevent strokes.  Diabetes screening involves taking a blood sample to check your fasting blood sugar level. This should be done once every 3 years, after age 53, if you are within normal weight and without risk factors for diabetes. Testing should be considered at a younger age or be carried out more frequently if you are overweight and have at least 1 risk factor for diabetes.  Breast cancer screening is essential preventive care for women. You should practice "breast self-awareness." This means understanding the normal appearance and feel of your breasts and may include breast self-examination. Any changes detected, no matter how small, should be reported to a health care provider. Women in their 63s and 30s should have a clinical breast exam (CBE) by a health care provider as part of a regular health exam every 1 to 3 years. After age 15, women should have a CBE every year. Starting at age 45, women should consider having a mammogram (breast X-ray test) every year. Women who have a family history of breast cancer should talk to their health care provider about genetic screening. Women at a high risk of breast cancer should talk to their health care providers about having an MRI and a mammogram every year.  Breast cancer gene (BRCA)-related cancer risk assessment is recommended for women who have family members with BRCA-related cancers. BRCA-related cancers include breast, ovarian, tubal, and peritoneal cancers. Having family members with these cancers may be associated with an increased risk for harmful changes  (mutations) in the breast cancer genes BRCA1 and BRCA2. Results of the assessment will determine the need for genetic counseling and BRCA1 and BRCA2 testing.  Routine pelvic exams to screen for cancer are no longer  recommended for nonpregnant women who are considered low risk for cancer of the pelvic organs (ovaries, uterus, and vagina) and who do not have symptoms. Ask your health care provider if a screening pelvic exam is right for you.  If you have had past treatment for cervical cancer or a condition that could lead to cancer, you need Pap tests and screening for cancer for at least 20 years after your treatment. If Pap tests have been discontinued, your risk factors (such as having a new sexual partner) need to be reassessed to determine if screening should be resumed. Some women have medical problems that increase the chance of getting cervical cancer. In these cases, your health care provider may recommend more frequent screening and Pap tests.    Colorectal cancer can be detected and often prevented. Most routine colorectal cancer screening begins at the age of 72 years and continues through age 70 years. However, your health care provider may recommend screening at an earlier age if you have risk factors for colon cancer. On a yearly basis, your health care provider may provide home test kits to check for hidden blood in the stool. Use of a small camera at the end of a tube, to directly examine the colon (sigmoidoscopy or colonoscopy), can detect the earliest forms of colorectal cancer. Talk to your health care provider about this at age 31, when routine screening begins.  Direct exam of the colon should be repeated every 5-10 years through age 56 years, unless early forms of pre-cancerous polyps or small growths are found.  Osteoporosis is a disease in which the bones lose minerals and strength with aging. This can result in serious bone fractures or breaks. The risk of osteoporosis can be  identified using a bone density scan. Women ages 28 years and over and women at risk for fractures or osteoporosis should discuss screening with their health care providers. Ask your health care provider whether you should take a calcium supplement or vitamin D to reduce the rate of osteoporosis.  Menopause can be associated with physical symptoms and risks. Hormone replacement therapy is available to decrease symptoms and risks. You should talk to your health care provider about whether hormone replacement therapy is right for you.  Use sunscreen. Apply sunscreen liberally and repeatedly throughout the day. You should seek shade when your shadow is shorter than you. Protect yourself by wearing long sleeves, pants, a wide-brimmed hat, and sunglasses year round, whenever you are outdoors.  Once a month, do a whole body skin exam, using a mirror to look at the skin on your back. Tell your health care provider of new moles, moles that have irregular borders, moles that are larger than a pencil eraser, or moles that have changed in shape or color.  Stay current with required vaccines (immunizations).  Influenza vaccine. All adults should be immunized every year.  Tetanus, diphtheria, and acellular pertussis (Td, Tdap) vaccine. Pregnant women should receive 1 dose of Tdap vaccine during each pregnancy. The dose should be obtained regardless of the length of time since the last dose. Immunization is preferred during the 27th-36th week of gestation. An adult who has not previously received Tdap or who does not know her vaccine status should receive 1 dose of Tdap. This initial dose should be followed by tetanus and diphtheria toxoids (Td) booster doses every 10 years. Adults with an unknown or incomplete history of completing a 3-dose immunization series with Td-containing vaccines should begin or complete a primary immunization series  including a Tdap dose. Adults should receive a Td booster every 10  years.    Zoster vaccine. One dose is recommended for adults aged 5 years or older unless certain conditions are present.    Pneumococcal 13-valent conjugate (PCV13) vaccine. When indicated, a person who is uncertain of her immunization history and has no record of immunization should receive the PCV13 vaccine. An adult aged 53 years or older who has certain medical conditions and has not been previously immunized should receive 1 dose of PCV13 vaccine. This PCV13 should be followed with a dose of pneumococcal polysaccharide (PPSV23) vaccine. The PPSV23 vaccine dose should be obtained at least 1 or more year(s) after the dose of PCV13 vaccine. An adult aged 90 years or older who has certain medical conditions and previously received 1 or more doses of PPSV23 vaccine should receive 1 dose of PCV13. The PCV13 vaccine dose should be obtained 1 or more years after the last PPSV23 vaccine dose.    Pneumococcal polysaccharide (PPSV23) vaccine. When PCV13 is also indicated, PCV13 should be obtained first. All adults aged 50 years and older should be immunized. An adult younger than age 78 years who has certain medical conditions should be immunized. Any person who resides in a nursing home or long-term care facility should be immunized. An adult smoker should be immunized. People with an immunocompromised condition and certain other conditions should receive both PCV13 and PPSV23 vaccines. People with human immunodeficiency virus (HIV) infection should be immunized as soon as possible after diagnosis. Immunization during chemotherapy or radiation therapy should be avoided. Routine use of PPSV23 vaccine is not recommended for American Indians, [place] Natives, or people younger than 65 years unless there are medical conditions that require PPSV23 vaccine. When indicated, people who have unknown immunization and have no record of immunization should receive PPSV23 vaccine. One-time revaccination 5 years after the  first dose of PPSV23 is recommended for people aged 19-64 years who have chronic kidney failure, nephrotic syndrome, asplenia, or immunocompromised conditions. People who received 1-2 doses of PPSV23 before age 47 years should receive another dose of PPSV23 vaccine at age 59 years or later if at least 5 years have passed since the previous dose. Doses of PPSV23 are not needed for people immunized with PPSV23 at or after age 7 years.   Preventive Services / Frequency  Ages 67 years and over  Blood pressure check.  Lipid and cholesterol check.  Lung cancer screening. / Every year if you are aged 64-80 years and have a 30-pack-year history of smoking and currently smoke or have quit within the past 15 years. Yearly screening is stopped once you have quit smoking for at least 15 years or develop a health problem that would prevent you from having lung cancer treatment.  Clinical breast exam.** / Every year after age 55 years.   BRCA-related cancer risk assessment.** / For women who have family members with a BRCA-related cancer (breast, ovarian, tubal, or peritoneal cancers).  Mammogram.** / Every year beginning at age 22 years and continuing for as long as you are in good health. Consult with your health care provider.  Pap test.** / Every 3 years starting at age 56 years through age 23 or 71 years with 3 consecutive normal Pap tests. Testing can be stopped between 65 and 70 years with 3 consecutive normal Pap tests and no abnormal Pap or HPV tests in the past 10 years.  Fecal occult blood test (FOBT) of stool. / Every year  beginning at age 83 years and continuing until age 91 years. You may not need to do this test if you get a colonoscopy every 10 years.  Flexible sigmoidoscopy or colonoscopy.** / Every 5 years for a flexible sigmoidoscopy or every 10 years for a colonoscopy beginning at age 32 years and continuing until age 66 years.  Hepatitis C blood test.** / For all people born from  [birth year] through 1964 and any individual with known risks for hepatitis C.  Osteoporosis screening.** / A one-time screening for women ages 9 years and over and women at risk for fractures or osteoporosis.  Skin self-exam. / Monthly.  Influenza vaccine. / Every year.  Tetanus, diphtheria, and acellular pertussis (Tdap/Td) vaccine.** / 1 dose of Td every 10 years.  Zoster vaccine.** / 1 dose for adults aged 45 years or older.  Pneumococcal 13-valent conjugate (PCV13) vaccine.** / Consult your health care provider.  Pneumococcal polysaccharide (PPSV23) vaccine.** / 1 dose for all adults aged 74 years and older. Screening for abdominal aortic aneurysm (AAA)  by ultrasound is recommended for people who have history of high blood pressure or who are current or former smokers. ++++++++++++++++++++ Recommend Adult Low Dose Aspirin or  coated  Aspirin 81 mg daily  To reduce risk of Colon Cancer 40 %,  Skin Cancer 26 % ,  Melanoma 46%  and  Pancreatic cancer 60% ++++++++++++++++++++ Vitamin D goal  is between 70-100.  Please make sure that you are taking your Vitamin D as directed.  It is very important as a natural anti-inflammatory  helping hair, skin, and nails, as well as reducing stroke and heart attack risk.  It helps your bones and helps with mood. It also decreases numerous cancer risks so please take it as directed.  Low Vit D is associated with a 200-300% higher risk for CANCER  and 200-300% higher risk for HEART   ATTACK  &  STROKE.   .....................................Marland Kitchen It is also associated with higher death rate at younger ages,  autoimmune diseases like Rheumatoid arthritis, Lupus, Multiple Sclerosis.    Also many other serious conditions, like depression, Alzheimer's Dementia, infertility, muscle aches, fatigue, fibromyalgia - just to name a few. ++++++++++++++++++ Recommend the book "The END of DIETING" by Dr Excell Seltzer  & the book "The END of DIABETES " by Dr Excell Seltzer At [email or web address] - get book & Audio CD's    Being diabetic has a  300% increased risk for heart attack, stroke, cancer, and alzheimer- type vascular dementia. It is very important that you work harder with diet by avoiding all foods that are white. Avoid white rice (brown & wild rice is OK), white potatoes (sweetpotatoes in moderation is OK), White bread or wheat bread or anything made out of white flour like bagels, donuts, rolls, buns, biscuits, cakes, pastries, cookies, pizza crust, and pasta (made from white flour & egg whites) - vegetarian pasta or spinach or wheat pasta is OK. Multigrain breads like Arnold's or [employer], or multigrain sandwich thins or flatbreads.  Diet, exercise and weight loss can reverse and cure diabetes in the early stages.  Diet, exercise and weight loss is very important in the control and prevention of complications of diabetes which affects every system in your body, ie. Brain - dementia/stroke, eyes - glaucoma/blindness, heart - heart attack/heart failure, kidneys - dialysis, stomach - gastric paralysis, intestines - malabsorption, nerves - severe painful neuritis, circulation - gangrene & loss of a leg(s), and finally cancer and Alzheimers.  I recommend avoid fried & greasy foods,  sweets/candy, white rice (brown or wild rice or Quinoa is OK), white potatoes (sweet potatoes are OK) - anything made from white flour - bagels, doughnuts, rolls, buns, biscuits,white and wheat breads, pizza crust and traditional pasta made of white flour & egg white(vegetarian pasta or spinach or wheat pasta is OK).  Multi-grain bread is OK - like multi-grain flat bread or sandwich thins. Avoid alcohol in excess. Exercise is also important.    Eat all the vegetables you want - avoid meat, especially red meat and dairy - especially cheese.  Cheese is the most concentrated form of trans-fats which is the worst thing to clog up our arteries. Veggie cheese is OK which can be found in the  fresh produce section at [employer] or [employer] or [employer]  +++++++++++++++++++ DASH Eating Plan  DASH stands for "Dietary Approaches to Stop Hypertension."   The DASH eating plan is a healthy eating plan that has been shown to reduce high blood pressure (hypertension). Additional health benefits may include reducing the risk of type 2 diabetes mellitus, heart disease, and stroke. The DASH eating plan may also help with weight loss. WHAT DO I NEED TO KNOW ABOUT THE DASH EATING PLAN? For the DASH eating plan, you will follow these general guidelines:  Choose foods with a percent daily value for sodium of less than 5% (as listed on the food label).  Use salt-free seasonings or herbs instead of table salt or sea salt.  Check with your health care provider or pharmacist before using salt substitutes.  Eat lower-sodium products, often labeled as "lower sodium" or "no salt added."  Eat fresh foods.  Eat more vegetables, fruits, and low-fat dairy products.  Choose whole grains. Look for the word "whole" as the first word in the ingredient list.  Choose fish   Limit sweets, desserts, sugars, and sugary drinks.  Choose heart-healthy fats.  Eat veggie cheese   Eat more home-cooked food and less restaurant, buffet, and fast food.  Limit fried foods.  Cook foods using methods other than frying.  Limit canned vegetables. If you do use them, rinse them well to decrease the sodium.  When eating at a restaurant, ask that your food be prepared with less salt, or no salt if possible.                      WHAT FOODS CAN I EAT? Read Dr Fara Olden Fuhrman's books on The End of Dieting & The End of Diabetes  Grains Whole grain or whole wheat bread. Brown rice. Whole grain or whole wheat pasta. Quinoa, bulgur, and whole grain cereals. Low-sodium cereals. Corn or whole wheat flour tortillas. Whole grain cornbread. Whole grain crackers. Low-sodium crackers.  Vegetables Fresh or frozen  vegetables (raw, steamed, roasted, or grilled). Low-sodium or reduced-sodium tomato and vegetable juices. Low-sodium or reduced-sodium tomato sauce and paste. Low-sodium or reduced-sodium canned vegetables.   Fruits All fresh, canned (in natural juice), or frozen fruits.  Protein Products  All fish and seafood.  Dried beans, peas, or lentils. Unsalted nuts and seeds. Unsalted canned beans.  Dairy Low-fat dairy products, such as skim or 1% milk, 2% or reduced-fat cheeses, low-fat ricotta or cottage cheese, or plain low-fat yogurt. Low-sodium or reduced-sodium cheeses.  Fats and Oils Tub margarines without trans fats. Light or reduced-fat mayonnaise and salad dressings (reduced sodium). Avocado. Safflower, olive, or canola oils. Natural peanut or almond butter.  Other Unsalted popcorn and  pretzels. The items listed above may not be a complete list of recommended foods or beverages. Contact your dietitian for more options.  +++++++++++++++  WHAT FOODS ARE NOT RECOMMENDED? Grains/ White flour or wheat flour White bread. White pasta. White rice. Refined cornbread. Bagels and croissants. Crackers that contain trans fat.  Vegetables  Creamed or fried vegetables. Vegetables in a . Regular canned vegetables. Regular canned tomato sauce and paste. Regular tomato and vegetable juices.  Fruits Dried fruits. Canned fruit in light or heavy syrup. Fruit juice.  Meat and Other Protein Products Meat in general - RED meat & White meat.  Fatty cuts of meat. Ribs, chicken wings, all processed meats as bacon, sausage, bologna, salami, fatback, hot dogs, bratwurst and packaged luncheon meats.  Dairy Whole or 2% milk, cream, half-and-half, and cream cheese. Whole-fat or sweetened yogurt. Full-fat cheeses or blue cheese. Non-dairy creamers and whipped toppings. Processed cheese, cheese spreads, or cheese curds.  Condiments Onion and garlic salt, seasoned salt, table salt, and sea salt. Canned and  packaged gravies. Worcestershire sauce. Tartar sauce. Barbecue sauce. Teriyaki sauce. Soy sauce, including reduced sodium. Steak sauce. Fish sauce. Oyster sauce. Cocktail sauce. Horseradish. Ketchup and mustard. Meat flavorings and tenderizers. Bouillon cubes. Hot sauce. Tabasco sauce. Marinades. Taco seasonings. Relishes.  Fats and Oils Butter, stick margarine, lard, shortening and bacon fat. Coconut, palm kernel, or palm oils. Regular salad dressings.  Pickles and olives. Salted popcorn and pretzels.  The items listed above may not be a complete list of foods and beverages to avoid.

## 2020-07-08 ENCOUNTER — Other Ambulatory Visit: Payer: Self-pay

## 2020-07-08 ENCOUNTER — Encounter: Payer: Self-pay | Admitting: Internal Medicine

## 2020-07-08 ENCOUNTER — Ambulatory Visit (INDEPENDENT_AMBULATORY_CARE_PROVIDER_SITE_OTHER): Payer: Medicare PPO | Admitting: Internal Medicine

## 2020-07-08 VITALS — BP 136/80 | HR 68 | Temp 97.2°F | Ht 61.0 in | Wt 156.0 lb

## 2020-07-08 DIAGNOSIS — Z79899 Other long term (current) drug therapy: Secondary | ICD-10-CM | POA: Diagnosis not present

## 2020-07-08 DIAGNOSIS — Z0000 Encounter for general adult medical examination without abnormal findings: Secondary | ICD-10-CM

## 2020-07-08 DIAGNOSIS — K219 Gastro-esophageal reflux disease without esophagitis: Secondary | ICD-10-CM

## 2020-07-08 DIAGNOSIS — E039 Hypothyroidism, unspecified: Secondary | ICD-10-CM

## 2020-07-08 DIAGNOSIS — Z136 Encounter for screening for cardiovascular disorders: Secondary | ICD-10-CM

## 2020-07-08 DIAGNOSIS — R7309 Other abnormal glucose: Secondary | ICD-10-CM

## 2020-07-08 DIAGNOSIS — I700 Atherosclerosis of aorta: Secondary | ICD-10-CM

## 2020-07-08 DIAGNOSIS — Z8249 Family history of ischemic heart disease and other diseases of the circulatory system: Secondary | ICD-10-CM

## 2020-07-08 DIAGNOSIS — Z0001 Encounter for general adult medical examination with abnormal findings: Secondary | ICD-10-CM

## 2020-07-08 DIAGNOSIS — E782 Mixed hyperlipidemia: Secondary | ICD-10-CM

## 2020-07-08 DIAGNOSIS — Z87891 Personal history of nicotine dependence: Secondary | ICD-10-CM

## 2020-07-08 DIAGNOSIS — Z1212 Encounter for screening for malignant neoplasm of rectum: Secondary | ICD-10-CM

## 2020-07-08 DIAGNOSIS — M069 Rheumatoid arthritis, unspecified: Secondary | ICD-10-CM

## 2020-07-08 DIAGNOSIS — I10 Essential (primary) hypertension: Secondary | ICD-10-CM

## 2020-07-08 DIAGNOSIS — E559 Vitamin D deficiency, unspecified: Secondary | ICD-10-CM | POA: Diagnosis not present

## 2020-07-08 DIAGNOSIS — M818 Other osteoporosis without current pathological fracture: Secondary | ICD-10-CM

## 2020-07-08 MED ORDER — ALENDRONATE SODIUM 70 MG PO TABS: ORAL_TABLET | ORAL | 3 refills | Status: DC

## 2020-07-08 MED ORDER — OLMESARTAN MEDOXOMIL 20 MG PO TABS: ORAL_TABLET | ORAL | 0 refills | Status: DC

## 2020-07-09 LAB — CBC WITH DIFFERENTIAL/PLATELET
Absolute Monocytes: 580 {cells}/uL (ref 200–950)
Basophils Absolute: 78 {cells}/uL (ref 0–200)
Basophils Relative: 1.7 %
Eosinophils Absolute: 469 {cells}/uL (ref 15–500)
Eosinophils Relative: 10.2 %
HCT: 38.2 % (ref 35.0–45.0)
Hemoglobin: 12.7 g/dL (ref 11.7–15.5)
Lymphs Abs: 1030 {cells}/uL (ref 850–3900)
MCH: 31.4 pg (ref 27.0–33.0)
MCHC: 33.2 g/dL (ref 32.0–36.0)
MCV: 94.6 fL (ref 80.0–100.0)
MPV: 12.8 fL — ABNORMAL HIGH (ref 7.5–12.5)
Monocytes Relative: 12.6 %
Neutro Abs: 2443 {cells}/uL (ref 1500–7800)
Neutrophils Relative %: 53.1 %
Platelets: 155 10*3/uL (ref 140–400)
RBC: 4.04 Million/uL (ref 3.80–5.10)
RDW: 13.5 % (ref 11.0–15.0)
Total Lymphocyte: 22.4 %
WBC: 4.6 10*3/uL (ref 3.8–10.8)

## 2020-07-09 LAB — LIPID PANEL
Cholesterol: 184 mg/dL
HDL: 71 mg/dL
LDL Cholesterol (Calc): 95 mg/dL
Non-HDL Cholesterol (Calc): 113 mg/dL
Total CHOL/HDL Ratio: 2.6 (calc)
Triglycerides: 88 mg/dL

## 2020-07-09 LAB — MICROALBUMIN / CREATININE URINE RATIO
Creatinine, Urine: 43 mg/dL (ref 20–275)
Microalb, Ur: <0.2 mg/dL

## 2020-07-09 LAB — COMPLETE METABOLIC PANEL WITHOUT GFR
AG Ratio: 2.3 (calc) (ref 1.0–2.5)
ALT: 18 U/L (ref 6–29)
AST: 30 U/L (ref 10–35)
Albumin: 4.2 g/dL (ref 3.6–5.1)
Alkaline phosphatase (APISO): 116 U/L (ref 37–153)
BUN: 10 mg/dL (ref 7–25)
CO2: 31 mmol/L (ref 20–32)
Calcium: 10.6 mg/dL — ABNORMAL HIGH (ref 8.6–10.4)
Chloride: 105 mmol/L (ref 98–110)
Creat: 0.93 mg/dL (ref 0.60–0.93)
GFR, Est African American: 71 mL/min/{1.73_m2}
GFR, Est Non African American: 61 mL/min/{1.73_m2}
Globulin: 1.8 g/dL — ABNORMAL LOW (ref 1.9–3.7)
Glucose, Bld: 90 mg/dL (ref 65–99)
Potassium: 4.4 mmol/L (ref 3.5–5.3)
Sodium: 141 mmol/L (ref 135–146)
Total Bilirubin: 0.5 mg/dL (ref 0.2–1.2)
Total Protein: 6 g/dL — ABNORMAL LOW (ref 6.1–8.1)

## 2020-07-09 LAB — URINALYSIS, ROUTINE W REFLEX MICROSCOPIC
Bacteria, UA: NONE SEEN /HPF
Bilirubin Urine: NEGATIVE
Glucose, UA: NEGATIVE
Hgb urine dipstick: NEGATIVE
Hyaline Cast: NONE SEEN /LPF
Ketones, ur: NEGATIVE
Nitrite: NEGATIVE
Protein, ur: NEGATIVE
RBC / HPF: NONE SEEN /HPF (ref 0–2)
Specific Gravity, Urine: 1.007 (ref 1.001–1.03)
Squamous Epithelial / HPF: NONE SEEN /HPF
WBC, UA: NONE SEEN /HPF (ref 0–5)
pH: 6.5 (ref 5.0–8.0)

## 2020-07-09 LAB — HEMOGLOBIN A1C
Hgb A1c MFr Bld: 5.1 % of total Hgb (ref <5.7)
Mean Plasma Glucose: 100 (calc)
eAG (mmol/L): 5.5 (calc)

## 2020-07-09 LAB — INSULIN, RANDOM: Insulin: 9.8 u[IU]/mL

## 2020-07-09 LAB — TSH: TSH: 7.06 m[IU]/L — ABNORMAL HIGH (ref 0.40–4.50)

## 2020-07-09 LAB — VITAMIN D 25 HYDROXY (VIT D DEFICIENCY, FRACTURES): Vit D, 25-Hydroxy: 73 ng/mL (ref 30–100)

## 2020-07-09 LAB — MAGNESIUM: Magnesium: 2.2 mg/dL (ref 1.5–2.5)

## 2020-07-16 ENCOUNTER — Other Ambulatory Visit: Payer: Self-pay | Admitting: Physician Assistant

## 2020-07-16 DIAGNOSIS — Z1231 Encounter for screening mammogram for malignant neoplasm of breast: Secondary | ICD-10-CM

## 2020-07-30 ENCOUNTER — Ambulatory Visit
Admission: RE | Admit: 2020-07-30 | Discharge: 2020-07-30 | Disposition: A | Discharge status: Home / Self Care | Payer: Medicare PPO | Source: Ambulatory Visit | Attending: Physician Assistant | Admitting: Physician Assistant

## 2020-07-30 ENCOUNTER — Other Ambulatory Visit: Payer: Self-pay

## 2020-07-30 DIAGNOSIS — Z1231 Encounter for screening mammogram for malignant neoplasm of breast: Secondary | ICD-10-CM

## 2020-08-11 ENCOUNTER — Ambulatory Visit (INDEPENDENT_AMBULATORY_CARE_PROVIDER_SITE_OTHER): Payer: Medicare PPO | Admitting: Physician Assistant

## 2020-08-11 ENCOUNTER — Encounter: Payer: Self-pay | Admitting: Physician Assistant

## 2020-08-11 ENCOUNTER — Other Ambulatory Visit: Payer: Self-pay

## 2020-08-11 VITALS — BP 140/82 | HR 56 | Temp 97.3°F | Wt 157.0 lb

## 2020-08-11 DIAGNOSIS — E039 Hypothyroidism, unspecified: Secondary | ICD-10-CM

## 2020-08-11 NOTE — Patient Instructions (Signed)
Hypothyroidism  Hypothyroidism is when the thyroid gland does not make enough of certain hormones (it is underactive). The thyroid gland is a small gland located in the lower front part of the neck, just in front of the windpipe (trachea). This gland makes hormones that help control how the body uses food for energy (metabolism) as well as how the heart and brain function. These hormones also play a role in keeping your bones strong. When the thyroid is underactive, it produces too little of the hormones thyroxine (T4) and triiodothyronine (T3). What are the causes? This condition may be caused by:  Hashimoto's disease. This is a disease in which the body's disease-fighting system (immune system) attacks the thyroid gland. This is the most common cause.  Viral infections.  Pregnancy.  Certain medicines.  Birth defects.  Past radiation treatments to the head or neck for cancer.  Past treatment with radioactive iodine.  Past exposure to radiation in the environment.  Past surgical removal of part or all of the thyroid.  Problems with a gland in the center of the brain (pituitary gland).  Lack of enough iodine in the diet. What increases the risk? You are more likely to develop this condition if:  You are female.  You have a family history of thyroid conditions.  You use a medicine called lithium.  You take medicines that affect the immune system (immunosuppressants). What are the signs or symptoms? Symptoms of this condition include:  Feeling as though you have no energy (lethargy).  Not being able to tolerate cold.  Weight gain that is not explained by a change in diet or exercise habits.  Lack of appetite.  Dry skin.  Coarse hair.  Menstrual irregularity.  Slowing of thought processes.  Constipation.  Sadness or depression. How is this diagnosed? This condition may be diagnosed based on:  Your symptoms, your medical history, and a physical exam.  Blood  tests. You may also have imaging tests, such as an ultrasound or MRI. How is this treated? This condition is treated with medicine that replaces the thyroid hormones that your body does not make. After you begin treatment, it may take several weeks for symptoms to go away. Follow these instructions at home:  Take over-the-counter and prescription medicines only as told by your health care provider.  If you start taking any new medicines, tell your health care provider.  Keep all follow-up visits as told by your health care provider. This is important. ? As your condition improves, your dosage of thyroid hormone medicine may change. ? You will need to have blood tests regularly so that your health care provider can monitor your condition. Contact a health care provider if:  Your symptoms do not get better with treatment.  You are taking thyroid replacement medicine and you: ? Sweat a lot. ? Have tremors. ? Feel anxious. ? Lose weight rapidly. ? Cannot tolerate heat. ? Have emotional swings. ? Have diarrhea. ? Feel weak. Get help right away if you have:  Chest pain.  An irregular heartbeat.  A rapid heartbeat.  Difficulty breathing. Summary  Hypothyroidism is when the thyroid gland does not make enough of certain hormones (it is underactive).  When the thyroid is underactive, it produces too little of the hormones thyroxine (T4) and triiodothyronine (T3).  The most common cause is Hashimoto's disease, a disease in which the body's disease-fighting system (immune system) attacks the thyroid gland. The condition can also be caused by viral infections, medicine, pregnancy, or past   radiation treatment to the head or neck.  Symptoms may include weight gain, dry skin, constipation, feeling as though you do not have energy, and not being able to tolerate cold.  This condition is treated with medicine to replace the thyroid hormones that your body does not make. This information  is not intended to replace advice given to you by your health care provider. Make sure you discuss any questions you have with your health care provider. Document Revised: 10/07/2017 Document Reviewed: 10/05/2017 Elsevier Patient Education  2020 [employer]  

## 2020-08-11 NOTE — Progress Notes (Signed)
Subjective:    Patient ID: [name], female    DOB: Dec 26, 1945, 74 y.o.   MRN: [id number]  HPI 74 y.o. WF presents with concerns for her thyroid levels.   She is on thyroid medication. Her medication was changed last visit by herself. She is on 1 pill Mon, Wed, Fri and 1.5 pill Tues, Thurs, Sat, and Sunday. She has the 112 mcg.  She is not on biotin- quit after her CPE. She takes all of her supplements and other medications at least 4 hours a way from the thyroid medication. She takes her thyroid medication around 4AM when she gets up to urinate and other medications around 9 or 10 after food.   She has cold intolerance, she has dry skin/hair. She has no constipation.  Lab Results  Component Value Date   TSH 7.06 (H) 07/08/2020  .   Blood pressure 140/82, pulse (!) 56, temperature (!) 97.3 F (36.3 C), weight 157 lb (71.2 kg), SpO2 97 %.  Medications  Current Outpatient Medications (Endocrine & Metabolic):  .  alendronate (FOSAMAX) 70 MG tablet, Take 1 tablet once weekly with a full glass of water on an empty stomach for 30 minutes. Marland Kitchen  levothyroxine (SYNTHROID) 112 MCG tablet, Take 1 tablet daily on an empty stomach with only water for 30 minutes & no Antacid meds, Calcium or Magnesium for 4 hours & avoid Biotin  Current Outpatient Medications (Cardiovascular):  .  olmesartan (BENICAR) 20 MG tablet, Take 1 tablet     Daily     for BP   Current Outpatient Medications (Analgesics):  .  aspirin 81 MG tablet, Take 81 mg by mouth daily. .  IBU 800 MG tablet,  .  Ibuprofen-Acetaminophen (ADVIL DUAL ACTION PO), Take by mouth. Takes PRN for pain.  Current Outpatient Medications (Hematological):  .  folic acid (FOLVITE) 1 MG tablet, Take 3 mg by mouth daily.   Current Outpatient Medications (Other):  Marland Kitchen  ALPRAZolam (XANAX) 1 MG tablet, Take 1/2 to 1 tablet 1 to 2 x / day only if needed for Acute Anxiety Attack and please try to limit to 5 days /week to avoid addiction .   Ascorbic Acid (VITAMIN C) 1000 MG tablet, Take 1,000 mg by mouth daily. .  Cholecalciferol (VITAMIN D PO), Take 5,000 Units by mouth daily.  Marland Kitchen  gabapentin (NEURONTIN) 600 MG tablet, Take 1/2 to 1 tablet 2 to 3 x /Daily as needed for Pain .  hydroxychloroquine (PLAQUENIL) 200 MG tablet, Take by mouth daily. Take 1 tab AM and PM .  Magnesium 500 MG TABS, Take 1 tablet by mouth daily. Marland Kitchen  MELATONIN PO, Take 1 tablet by mouth at bedtime. .  methotrexate (RHEUMATREX) 2.5 MG tablet, Take 2.5 mg by mouth once a week. Patient injects 2.5 mg /ml 1 ml [place] weekly. Marland Kitchen  omeprazole (PRILOSEC) 40 MG capsule, TAKE 1 CAPSULE TWICE DAILY FOR ACID REFLUX .  OVER THE COUNTER MEDICATION, Takes hair, skin and nails 1 time daily. Marland Kitchen  OVER THE COUNTER MEDICATION, Apply voltaren gel to heel. .  Probiotic CAPS, Take 1 capsule by mouth daily. Marland Kitchen  zinc gluconate 50 MG tablet, Take 50 mg by mouth daily.  Problem list She has Essential hypertension; Rheumatoid arthritis ([place]); Hyperlipidemia, mixed; Gastroesophageal reflux disease without esophagitis; Personal history of colonic polyps; Vitamin D deficiency; Abnormal glucose; Medication management; Hypothyroidism; Osteoporosis; Hyperparathyroidism ([place]); Overweight (BMI 25.0-29.9); Insomnia; Neuropathy; Aortic atherosclerosis ([place]); and Depression, major, single episode, in partial remission ([place]) on  their problem list.   Review of Systems See HPI    Objective:   Physical Exam General appearance: alert, no distress, WD/WN,  female HEENT: normocephalic, sclerae anicteric, TMs pearly, nares patent, no discharge or erythema, pharynx normal Oral cavity: MMM, no lesions Neck: supple, no lymphadenopathy, no thyromegaly, no masses Heart: RRR, normal S1, S2, 1/6 systolic murmur RSB Lungs: CTA bilaterally, no wheezes, rhonchi, or rales Abdomen: +bs, soft, nontender, non distended, no masses, no hepatomegaly, no splenomegaly Musculoskeletal:  no swelling, no obvious  deformity Extremities: no edema, no cyanosis, no clubbing Pulses: 2+ symmetric, upper and lower extremities, normal cap refill Neurological: alert, oriented x 3, CN2-12 intact, no tremor today noted, strength normal upper extremities and lower extremities, good finger to nose, DTRs 2+ throughout, decreased sensation bilateral feet and hands, + romberg, gait normal Psychiatric: normal affect, behavior normal, pleasant       Assessment & Plan:    Hypothyroidism, unspecified type Patient is off biotin Has increase her thyroid medication still having fatigue, cold intolerance and skin/hair issues- will adjust to reach goal of around 2 -     TSH    Future Appointments  Date Time Provider [place]  10/16/2020  9:30 AM Garnet Sierras, NP [facility] None  01/21/2021  9:30 AM [name], MD [facility] None  07/22/2021 10:00 AM [name], MD [facility] None

## 2020-08-12 LAB — TSH: TSH: 0.42 mIU/L (ref 0.40–4.50)

## 2020-09-20 ENCOUNTER — Other Ambulatory Visit: Payer: Self-pay | Admitting: Internal Medicine

## 2020-09-20 DIAGNOSIS — I10 Essential (primary) hypertension: Secondary | ICD-10-CM

## 2020-09-20 MED ORDER — OLMESARTAN MEDOXOMIL 40 MG PO TABS: ORAL_TABLET | ORAL | 0 refills | Status: DC

## 2020-09-21 ENCOUNTER — Other Ambulatory Visit: Payer: Self-pay | Admitting: Internal Medicine

## 2020-09-21 DIAGNOSIS — F411 Generalized anxiety disorder: Secondary | ICD-10-CM

## 2020-09-22 ENCOUNTER — Other Ambulatory Visit: Payer: Self-pay | Admitting: *Deleted

## 2020-09-22 DIAGNOSIS — I10 Essential (primary) hypertension: Secondary | ICD-10-CM

## 2020-09-22 MED ORDER — OLMESARTAN MEDOXOMIL 40 MG PO TABS: ORAL_TABLET | ORAL | 0 refills | Status: DC

## 2020-09-23 ENCOUNTER — Other Ambulatory Visit: Payer: Self-pay | Admitting: Adult Health Nurse Practitioner

## 2020-09-23 MED ORDER — ALPRAZOLAM 1 MG PO TABS: ORAL_TABLET | ORAL | 0 refills | Status: DC

## 2020-09-25 DIAGNOSIS — Z79899 Other long term (current) drug therapy: Secondary | ICD-10-CM | POA: Diagnosis not present

## 2020-10-01 ENCOUNTER — Other Ambulatory Visit: Payer: Self-pay | Admitting: Internal Medicine

## 2020-10-01 DIAGNOSIS — I10 Essential (primary) hypertension: Secondary | ICD-10-CM

## 2020-10-01 MED ORDER — OLMESARTAN MEDOXOMIL 40 MG PO TABS: ORAL_TABLET | ORAL | 0 refills | Status: DC

## 2020-10-13 DIAGNOSIS — Z6831 Body mass index (BMI) 31.0-31.9, adult: Secondary | ICD-10-CM | POA: Diagnosis not present

## 2020-10-13 DIAGNOSIS — M2550 Pain in unspecified joint: Secondary | ICD-10-CM | POA: Diagnosis not present

## 2020-10-13 DIAGNOSIS — E669 Obesity, unspecified: Secondary | ICD-10-CM | POA: Diagnosis not present

## 2020-10-13 DIAGNOSIS — M150 Primary generalized (osteo)arthritis: Secondary | ICD-10-CM | POA: Diagnosis not present

## 2020-10-13 DIAGNOSIS — M0609 Rheumatoid arthritis without rheumatoid factor, multiple sites: Secondary | ICD-10-CM | POA: Diagnosis not present

## 2020-10-13 DIAGNOSIS — Z79899 Other long term (current) drug therapy: Secondary | ICD-10-CM | POA: Diagnosis not present

## 2020-10-16 ENCOUNTER — Other Ambulatory Visit: Payer: Self-pay

## 2020-10-16 ENCOUNTER — Encounter: Payer: Self-pay | Admitting: Adult Health Nurse Practitioner

## 2020-10-16 ENCOUNTER — Ambulatory Visit: Payer: Medicare PPO | Admitting: Adult Health Nurse Practitioner

## 2020-10-16 DIAGNOSIS — Z79899 Other long term (current) drug therapy: Secondary | ICD-10-CM

## 2020-10-16 DIAGNOSIS — E782 Mixed hyperlipidemia: Secondary | ICD-10-CM | POA: Diagnosis not present

## 2020-10-16 DIAGNOSIS — E039 Hypothyroidism, unspecified: Secondary | ICD-10-CM | POA: Diagnosis not present

## 2020-10-16 DIAGNOSIS — E559 Vitamin D deficiency, unspecified: Secondary | ICD-10-CM

## 2020-10-16 DIAGNOSIS — K219 Gastro-esophageal reflux disease without esophagitis: Secondary | ICD-10-CM

## 2020-10-16 DIAGNOSIS — R7309 Other abnormal glucose: Secondary | ICD-10-CM | POA: Diagnosis not present

## 2020-10-16 DIAGNOSIS — F5101 Primary insomnia: Secondary | ICD-10-CM

## 2020-10-16 DIAGNOSIS — M069 Rheumatoid arthritis, unspecified: Secondary | ICD-10-CM

## 2020-10-16 DIAGNOSIS — F324 Major depressive disorder, single episode, in partial remission: Secondary | ICD-10-CM | POA: Diagnosis not present

## 2020-10-16 DIAGNOSIS — I10 Essential (primary) hypertension: Secondary | ICD-10-CM | POA: Diagnosis not present

## 2020-10-16 MED ORDER — TRAZODONE HCL 100 MG PO TABS: 100.0000 mg | ORAL_TABLET | Freq: Every day | ORAL | 1 refills | Status: DC

## 2020-10-16 MED ORDER — AMLODIPINE BESYLATE 2.5 MG PO TABS: 2.5000 mg | ORAL_TABLET | Freq: Every day | ORAL | 3 refills | Status: DC

## 2020-10-16 NOTE — Progress Notes (Signed)
FOLLOW UP 3 MONTH  Assessment & Plan:   Essential hypertension Continue current medications: Olmesartan 40mg  daily , Rx Amlodapine 2.5mg  night.  Check blood pressure BID & record.  Follow up in two weeks with log. Monitor blood pressure at home; call if consistently over 130/80 Continue DASH diet.   Reminder to go to the ER if any CP, SOB, nausea, dizziness, severe HA, changes vision/speech, left arm numbness and tingling and jaw pain. -     CBC with Differential/Platelet -     COMPLETE METABOLIC PANEL WITH GFR -     TSH - continue medications, DASH diet, exercise and monitor at home. Call if greater than 130/80.   Hyperlipidemia, mixed -     Lipid panel check lipids decrease fatty foods increase activity.   Hypothyroidism, unspecified type Taking levothyroxine 112 mcg 1.5tablets three days a wek Tue, Thurs, Sat & 1tab four days a week. Reminder to take on an empty stomach 30-62mins before first meal of the day. No antacid medications for 4 hours. -     TSH  Abnormal glucose Discussed disease progression and risks Discussed diet/exercise, weight management and risk modification  Vitamin D deficiency -     VITAMIN D 25 Hydroxy (Vit-D Deficiency, Fractures)  Hyperparathyroidism (HCC) Monitor  Aortic atherosclerosis (HCC)  Control blood pressure, cholesterol, glucose, increase exercise.   Rheumatoid arthritis involving multiple sites, unspecified whether rheumatoid factor present ([place]) Continue follow up  Depression, major, single episode, in partial remission ([place]) Doing well at this time, continue to monitor No medications:  Discussed stress management techniques  Discussed, increase water,intake & good sleep hygiene  Discussed increasing exercise & vegetables in diet  GERD Doing well at this time Continue: omeprazole 40mg  BID PRN Diet discussed Monitor for triggers Avoid food with high acid content Avoid excessive cafeine Increase water intake  Medication  management Continued   Subjective:    Patient ID: [name], female    DOB: [birth year], 74 y.o.   MRN: [id number]  .  74 y.o. former smoking (Q96) WF with history of HTN, HLD,history of pre-diabetes (abnormal glucose), overweight, vitamin D def, RA on MTX and plaquenil and vitamin D deficiency presents for follow up.  Her blood pressure has been elevated  She has elevated blood pressure in office today.  Reports she does check at home and she has had some elevated readings in the evening 140's-150's over 80-90.  She is asymptomatic.  She is taking olmesartan 40mg  daily.  Of note she had a work up for syncopal episodes/dizziness in 2014 with negative holter that showed PVC/PAC with Dr. Mare Ferrari, normal stress test 2016 and normal MRI in 2012. She was taken off her HCTZ at that time. She was recently sent to see neurology, had a normal MRI brain, normal EEG and EMG showed neuropathy. She did not have a MRI cspine.  Her last fall was Jan 2021.    Stress test 2016 Dr. Stanford Breed  Nuclear stress EF: 64%.  The study is normal.  This is a low risk study.  The left ventricular ejection fraction is normal (55-65%). Never had CXR  Lab Results  Component Value Date   TSH 0.42 08/11/2020   Colonoscopy and EGD in 2014  She is on gabapentin 100mg  1 in AM and 2 at night, xanax, losartan 25mg  and off biotin x 1 month.  She sees Dr. Trudie Reed regularly and states that her RA is well controlled on plaquenil and Methotrexate  She is on fosamax for osteoporosis,  stopped and restarted ago in fall 2018.   she has a diagnosis of insomnia and is currently on melatonin 20mg , advil pm and takes 0.5 mg of xanax (has failed multiple agents in the past including trazodone), reports symptoms are well controlled on current regimen.  she has a diagnosis of GERD with esophagitis and hx of PUD/gastric ulcer for which she continues management by prilosec 40 mg. She will have soft stools, like soft serve,  will also have fecal incontinence. Has the neuropathy. Will suggest she follow up with ortho, will get Xray lumbar, and suggest getting on fiber supplement to bulk stool and to follow up with GI. Colonoscopy was 2015.   BMI is There is no height or weight on file to calculate BMI., she is working on diet and exercise. Wt Readings from Last 3 Encounters:  08/11/20 157 lb (71.2 kg)  07/08/20 156 lb (70.8 kg)  03/05/20 163 lb (73.9 kg)   Her blood pressure is checked at home. Has been taking 50 mg losartan.   BP Readings from Last 3 Encounters:  08/11/20 140/82  07/08/20 136/80  03/05/20 122/84   She does not workout.  She denies chest pain, shortness of breath, dizziness.   She is not on cholesterol medication and denies myalgias. Her cholesterol is at goal. The cholesterol last visit was:   Lab Results  Component Value Date   CHOL 184 07/08/2020   HDL 71 07/08/2020   LDLCALC 95 07/08/2020   TRIG 88 07/08/2020   CHOLHDL 2.6 07/08/2020   Lab Results  Component Value Date   HGBA1C 5.1 07/08/2020   Patient is on Vitamin D supplement. Lab Results  Component Value Date   VD25OH 37 07/08/2020   She is on thyroid medication. Her medication was NOT changed last visit, 121mcg, 1 pill daily.  Patient denies nervousness, palpitations and weight changes. She is on biotin.  Lab Results  Component Value Date   TSH 0.42 08/11/2020    There were no vitals taken for this visit.  Medications  Current Outpatient Medications (Endocrine & Metabolic):  .  alendronate (FOSAMAX) 70 MG tablet, Take 1 tablet once weekly with a full glass of water on an empty stomach for 30 minutes. Marland Kitchen  levothyroxine (SYNTHROID) 112 MCG tablet, Take 1 tablet daily on an empty stomach with only water for 30 minutes & no Antacid meds, Calcium or Magnesium for 4 hours & avoid Biotin  Current Outpatient Medications (Cardiovascular):  .  olmesartan (BENICAR) 40 MG tablet, Take      1 tablet       Daily      for  BP   Current Outpatient Medications (Analgesics):  .  aspirin 81 MG tablet, Take 81 mg by mouth daily. .  IBU 800 MG tablet,  .  Ibuprofen-Acetaminophen (ADVIL DUAL ACTION PO), Take by mouth. Takes PRN for pain.  Current Outpatient Medications (Hematological):  .  folic acid (FOLVITE) 1 MG tablet, Take 3 mg by mouth daily.   Current Outpatient Medications (Other):  Marland Kitchen  ALPRAZolam (XANAX) 1 MG tablet, Take      1/2 to 1 tablet      at Bedtime     ONLY if needed for  Sleep and please try to limit to 5 days /week to avoid addiction .  Ascorbic Acid (VITAMIN C) 1000 MG tablet, Take 1,000 mg by mouth daily. .  Cholecalciferol (VITAMIN D PO), Take 5,000 Units by mouth daily.  Marland Kitchen  gabapentin (NEURONTIN) 600 MG tablet,  Take 1/2 to 1 tablet 2 to 3 x /Daily as needed for Pain .  hydroxychloroquine (PLAQUENIL) 200 MG tablet, Take by mouth daily. Take 1 tab AM and PM .  Magnesium 500 MG TABS, Take 1 tablet by mouth daily. Marland Kitchen  MELATONIN PO, Take 1 tablet by mouth at bedtime. .  methotrexate (RHEUMATREX) 2.5 MG tablet, Take 2.5 mg by mouth once a week. Patient injects 2.5 mg /ml 1 ml [place] weekly. Marland Kitchen  omeprazole (PRILOSEC) 40 MG capsule, TAKE 1 CAPSULE TWICE DAILY FOR ACID REFLUX .  OVER THE COUNTER MEDICATION, Takes hair, skin and nails 1 time daily. Marland Kitchen  OVER THE COUNTER MEDICATION, Apply voltaren gel to heel. .  Probiotic CAPS, Take 1 capsule by mouth daily. Marland Kitchen  zinc gluconate 50 MG tablet, Take 50 mg by mouth daily.  Problem list She has Essential hypertension; Rheumatoid arthritis ([place]); Hyperlipidemia, mixed; Gastroesophageal reflux disease without esophagitis; Personal history of colonic polyps; Vitamin D deficiency; Abnormal glucose; Medication management; Hypothyroidism; Osteoporosis; Hyperparathyroidism ([place]); Overweight (BMI 25.0-29.9); Insomnia; Neuropathy; Aortic atherosclerosis ([place]); and Depression, major, single episode, in partial remission ([place]) on their problem list.  Allergies Allergies   Allergen Reactions  . Ace Inhibitors Cough  . Ambien [Zolpidem] Other (See Comments)    Odd Feeling  . Crestor [Rosuvastatin]   . Pravastatin   . Prozac [Fluoxetine Hcl] Other (See Comments)    Decreased libido  . 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 FAMILY HISTORY Her family history includes Breast cancer in her paternal aunt; Colon cancer in her maternal grandfather; Heart attack in her brother and father; Lung cancer in her mother; Prostate cancer in her paternal grandfather; Stomach cancer in her father. SOCIAL HISTORY She  reports that she quit smoking about 24 years ago. She has never used smokeless tobacco. She reports current alcohol use. She reports that she does not use drugs.  Review of Systems  Constitutional: Negative for chills, diaphoresis, fever, malaise/fatigue and weight loss.  HENT: Negative for congestion, ear discharge, ear pain, hearing loss, nosebleeds, sinus pain, sore throat and tinnitus.   Eyes: Negative for blurred vision, double vision, photophobia, pain, discharge and redness.  Respiratory: Negative for cough, hemoptysis, sputum production, shortness of breath, wheezing and stridor.   Cardiovascular: Negative for chest pain, palpitations, orthopnea, claudication, leg swelling and PND.  Gastrointestinal: Negative for abdominal pain, blood in stool, constipation, diarrhea, heartburn, melena, nausea and vomiting.  Genitourinary: Negative for dysuria, flank pain, frequency, hematuria and urgency.  Musculoskeletal: Positive for joint pain. Negative for back pain, falls, myalgias and neck  pain.  Skin: Negative for itching and rash.  Neurological: Negative for dizziness, tingling, tremors, sensory change, speech change, focal weakness, seizures, loss of consciousness, weakness and headaches.  Endo/Heme/Allergies: Negative for environmental allergies and polydipsia. Does not bruise/bleed easily.  Psychiatric/Behavioral: Negative for depression, hallucinations, memory loss, substance abuse and suicidal ideas. The patient is not nervous/anxious and does not have insomnia.         Objective:   Physical Exam General appearance: alert, no distress, WD/WN,  female HEENT: normocephalic, sclerae anicteric, TMs pearly, nares patent, no discharge or erythema, pharynx normal Oral cavity: MMM, no lesions Neck: supple, no lymphadenopathy, no thyromegaly, no masses Heart: RRR, normal  S1, S2, 1/6 systolic murmur RSB Lungs: CTA bilaterally, no wheezes, rhonchi, or rales Abdomen: +bs, soft, nontender, non distended, no masses, no hepatomegaly, no splenomegaly Musculoskeletal:  no swelling, no obvious deformity Extremities: no edema, no cyanosis, no clubbing Pulses: 2+ symmetric, upper and lower extremities, normal cap refill Neurological: alert, oriented x 3, CN2-12 intact, tremor right thumb at rest, strength normal upper extremities and lower extremities, good finger to nose, DTRs 2+ throughout, decreased sensation bilateral feet and hands, + romberg, gait normal Psychiatric: normal affect, behavior normal, pleasant  Breast: defer Gyn: defer Rectal: defer    Garnet Sierras, NP 10/16/20

## 2020-10-16 NOTE — Patient Instructions (Signed)
We are going to add amlodipine (Norvasc) 2.5mg  nightly  Continue taking Olmesartan 40mg  daily.  Check your blood pressure twice a day for two weeks.  Send me a MyChart message with the last four readings after two weeks.  Contact the office if you have continued higher readings, 160 or above for th top number.   We will contact you via MyChart with your lab results in 1-3 days.    GENERAL HEALTH GOALS  Know what a healthy weight is for you (roughly BMI <25) and aim to maintain this  Aim for 7+ servings of fruits and vegetables daily  70-80+ fluid ounces of water or unsweet tea for healthy kidneys  Limit to max 1 drink of alcohol per day; avoid smoking/tobacco  Limit animal fats in diet for cholesterol and heart health - choose grass fed whenever available  Avoid highly processed foods, and foods high in saturated/trans fats  Aim for low stress - take time to unwind and care for your mental health  Aim for 150 min of moderate intensity exercise weekly for heart health, and weights twice weekly for bone health  Aim for 7-9 hours of sleep daily

## 2020-10-17 ENCOUNTER — Other Ambulatory Visit: Payer: Self-pay | Admitting: Internal Medicine

## 2020-10-17 ENCOUNTER — Other Ambulatory Visit: Payer: Self-pay

## 2020-10-17 DIAGNOSIS — E039 Hypothyroidism, unspecified: Secondary | ICD-10-CM

## 2020-10-17 DIAGNOSIS — F5101 Primary insomnia: Secondary | ICD-10-CM

## 2020-10-17 LAB — COMPLETE METABOLIC PANEL WITH GFR
AG Ratio: 2.1 (calc) (ref 1.0–2.5)
ALT: 14 U/L (ref 6–29)
AST: 23 U/L (ref 10–35)
Albumin: 4.1 g/dL (ref 3.6–5.1)
Alkaline phosphatase (APISO): 110 U/L (ref 37–153)
BUN/Creatinine Ratio: 11 (calc) (ref 6–22)
BUN: 11 mg/dL (ref 7–25)
CO2: 30 mmol/L (ref 20–32)
Calcium: 10.7 mg/dL — ABNORMAL HIGH (ref 8.6–10.4)
Chloride: 107 mmol/L (ref 98–110)
Creat: 0.97 mg/dL — ABNORMAL HIGH (ref 0.60–0.93)
GFR, Est African American: 67 mL/min/{1.73_m2} (ref >=60)
GFR, Est Non African American: 58 mL/min/{1.73_m2} — ABNORMAL LOW (ref >=60)
Globulin: 2 g/dL (calc) (ref 1.9–3.7)
Glucose, Bld: 90 mg/dL (ref 65–99)
Potassium: 4.6 mmol/L (ref 3.5–5.3)
Sodium: 143 mmol/L (ref 135–146)
Total Bilirubin: 0.6 mg/dL (ref 0.2–1.2)
Total Protein: 6.1 g/dL (ref 6.1–8.1)

## 2020-10-17 LAB — CBC WITH DIFFERENTIAL/PLATELET
Absolute Monocytes: 455 cells/uL (ref 200–950)
Basophils Absolute: 51 cells/uL (ref 0–200)
Basophils Relative: 1.1 %
Eosinophils Absolute: 317 cells/uL (ref 15–500)
Eosinophils Relative: 6.9 %
HCT: 39.2 % (ref 35.0–45.0)
Hemoglobin: 13.1 g/dL (ref 11.7–15.5)
Lymphs Abs: 994 cells/uL (ref 850–3900)
MCH: 31.5 pg (ref 27.0–33.0)
MCHC: 33.4 g/dL (ref 32.0–36.0)
MCV: 94.2 fL (ref 80.0–100.0)
MPV: 12.9 fL — ABNORMAL HIGH (ref 7.5–12.5)
Monocytes Relative: 9.9 %
Neutro Abs: 2783 cells/uL (ref 1500–7800)
Neutrophils Relative %: 60.5 %
Platelets: 170 10*3/uL (ref 140–400)
RBC: 4.16 10*6/uL (ref 3.80–5.10)
RDW: 12.5 % (ref 11.0–15.0)
Total Lymphocyte: 21.6 %
WBC: 4.6 10*3/uL (ref 3.8–10.8)

## 2020-10-17 LAB — TSH: TSH: 0.75 mIU/L (ref 0.40–4.50)

## 2020-10-17 MED ORDER — TRAZODONE HCL 100 MG PO TABS: 100.0000 mg | ORAL_TABLET | Freq: Every day | ORAL | 1 refills | Status: DC

## 2020-10-17 MED ORDER — LEVOTHYROXINE SODIUM 137 MCG PO TABS: ORAL_TABLET | ORAL | 1 refills | Status: DC

## 2020-12-06 ENCOUNTER — Other Ambulatory Visit: Payer: Self-pay | Admitting: Internal Medicine

## 2020-12-08 ENCOUNTER — Other Ambulatory Visit: Payer: Self-pay | Admitting: Internal Medicine

## 2020-12-08 MED ORDER — IBU 800 MG PO TABS: ORAL_TABLET | ORAL | 0 refills | Status: DC

## 2020-12-10 ENCOUNTER — Other Ambulatory Visit: Payer: Self-pay | Admitting: Internal Medicine

## 2020-12-10 DIAGNOSIS — I10 Essential (primary) hypertension: Secondary | ICD-10-CM

## 2020-12-16 IMAGING — CR DG CHEST 2V
2 series · 2 of 2 positions shown · non-contrast
Comparison: 09/20/2005

CLINICAL DATA: Syncope

EXAM:
CHEST - 2 VIEW

[w chest pa]
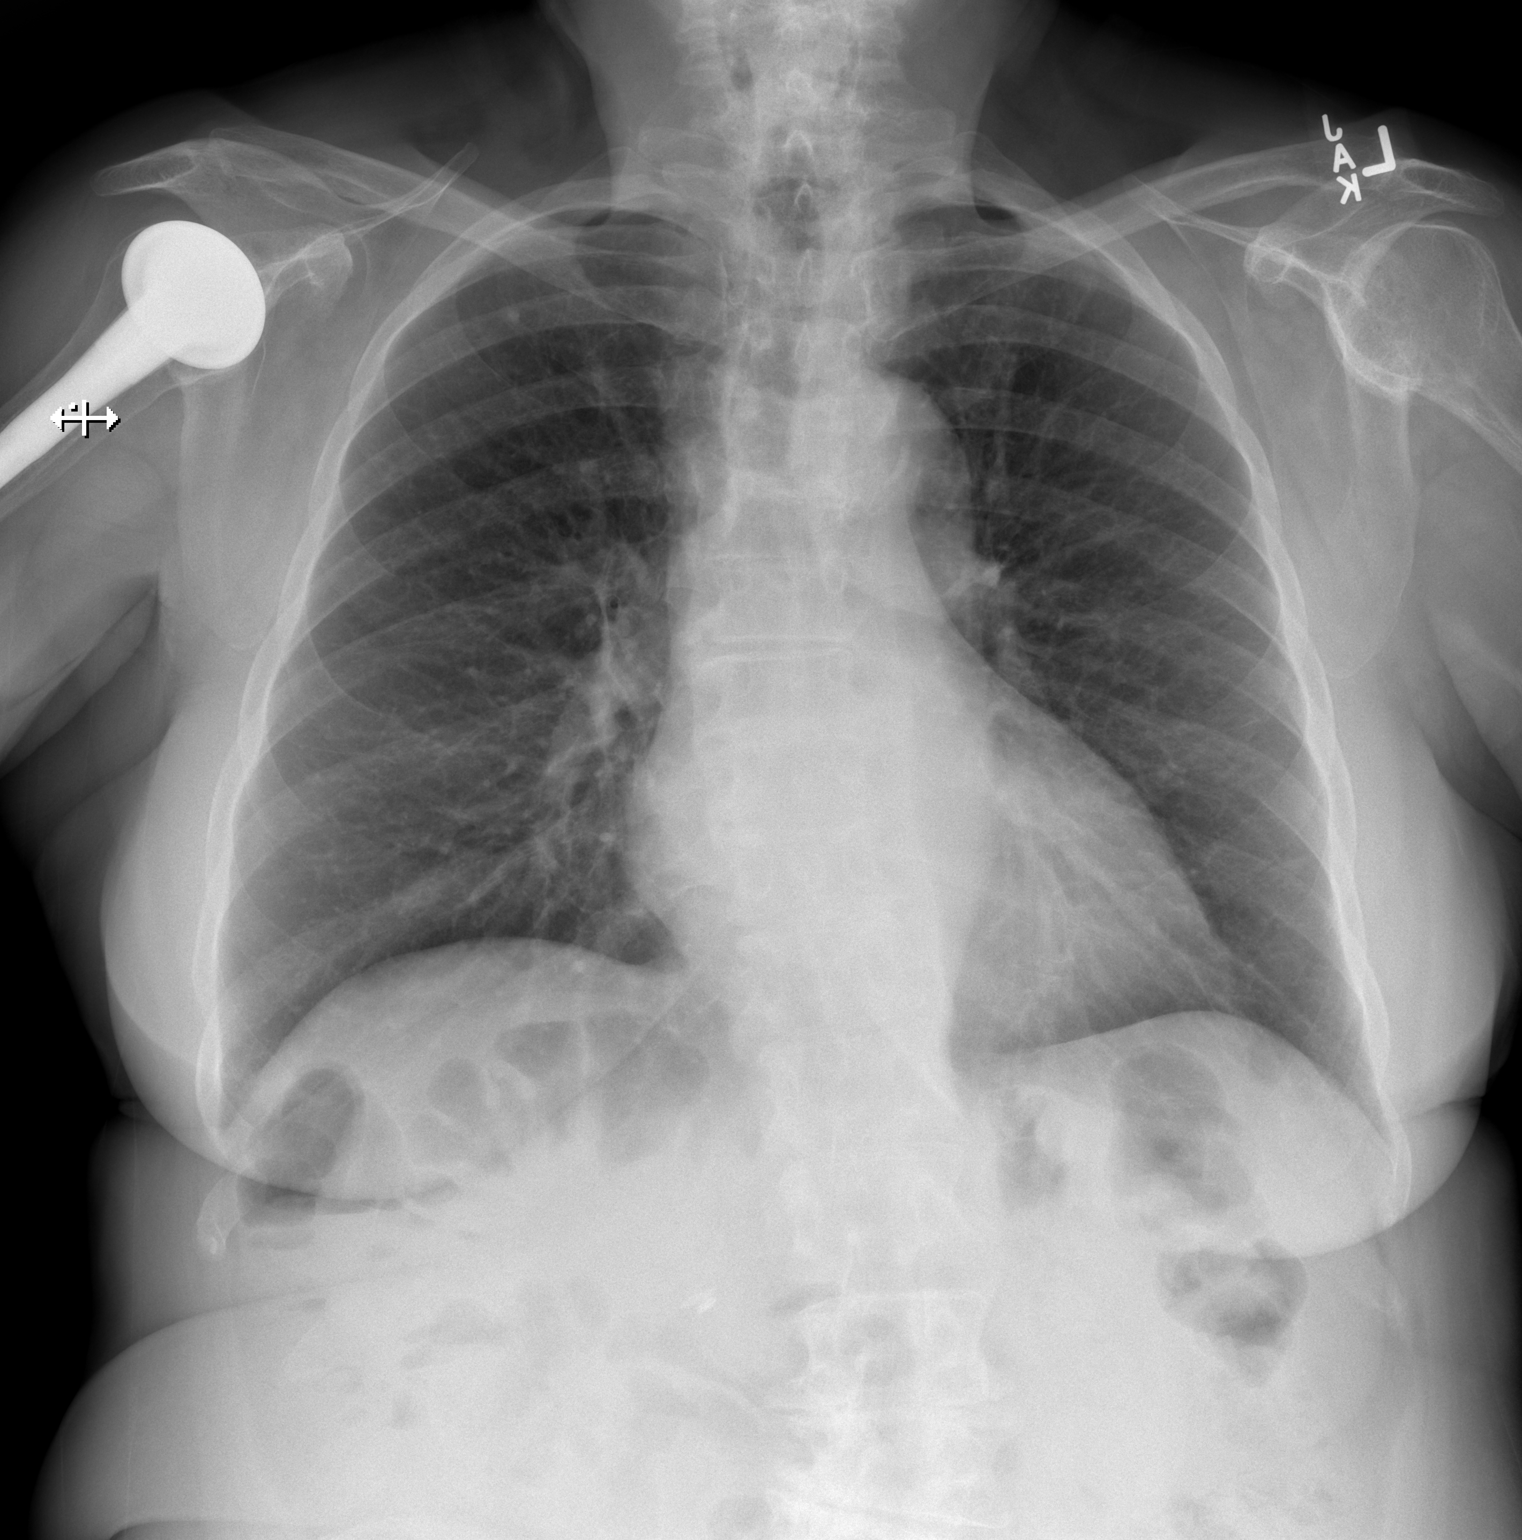

[w chest lat]
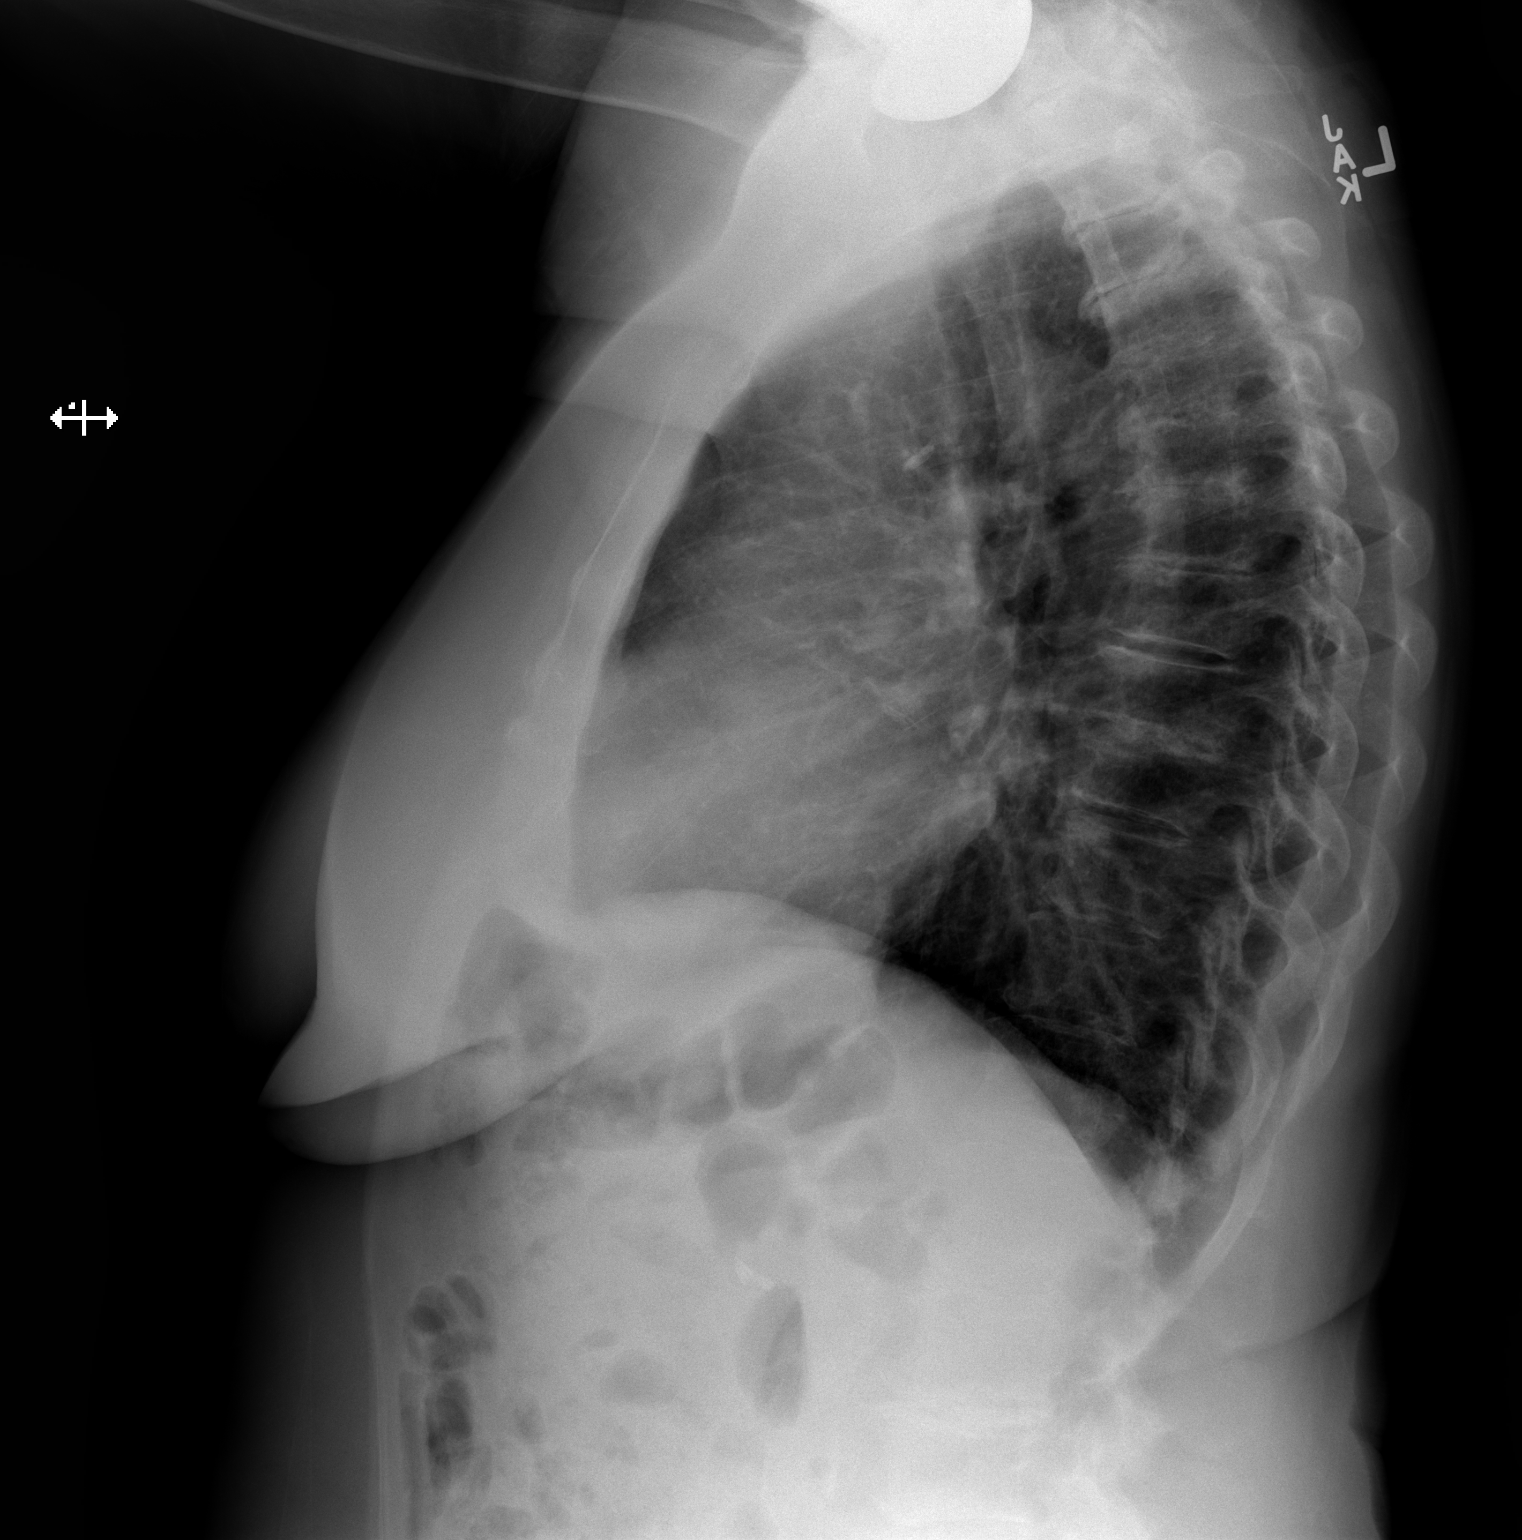

[2 of 2 positions shown; findings below may reference images not displayed]

FINDINGS: No focal opacity or pleural effusion. Normal heart size. Aortic
atherosclerosis. Stable calcified granuloma right apex. No
pneumothorax. Right shoulder replacement.
IMPRESSION: No active cardiopulmonary disease.

## 2021-01-12 DIAGNOSIS — M0609 Rheumatoid arthritis without rheumatoid factor, multiple sites: Secondary | ICD-10-CM | POA: Diagnosis not present

## 2021-01-20 ENCOUNTER — Encounter: Payer: Self-pay | Admitting: Internal Medicine

## 2021-01-20 NOTE — Progress Notes (Signed)
History of Present Illness:       This very nice 75 y.o. MWF presents for 6 month follow up with HTN, HLD, Pre-Diabetes, Hypothyroidism, Rheumatoid Arthritis and Vitamin D Deficiency.  Patient also has GERD controlled with her Omeprazole.  Patient's RA predates from 1985 and she's followed by Dr Berna Bue on MTX & Plaquenil. CXR in 2021 revealed Aortic Atherosclerosis.       Patient is treated for HTN (1997)  & BP has been controlled at home. Today's BP is elevated at 148/72 & rechecked x 2. Patient has CKD 3a (GFR 58) . Patient has had no complaints of any cardiac type chest pain, palpitations, dyspnea / orthopnea / PND, dizziness, claudication, or dependent edema.       Hyperlipidemia is controlled with diet . Last Lipids were at goal:  Lab Results  Component Value Date   CHOL 184 07/08/2020   HDL 71 07/08/2020   LDLCALC 95 07/08/2020   TRIG 88 07/08/2020   CHOLHDL 2.6 07/08/2020     Also, the patient has history of PreDiabetes (A1c 5.8%/2011) and has had no symptoms of reactive hypoglycemia, diabetic polys, paresthesias or visual blurring.  Last A1c was normal & at goal:  Lab Results  Component Value Date   HGBA1C 5.1 07/08/2020       Patient has been on thyroid replacement since dx'd Hypothyroid in the 1980's.         Further, the patient also has history of Vitamin D Deficiency ("38" /2008) and supplements vitamin D without any suspected side-effects. Last vitamin D was at goal:  Lab Results  Component Value Date   VD25OH 73 07/08/2020    Current Outpatient Medications on File Prior to Visit  Medication Sig  . alendronate  70 MG tablet Take 1 tablet once weekly   . ALPRAZolam 1 MG tablet Take 1/2 to 1 tablet at Bedtime ONLY if needed  . amLODipine  2.5 MG tablet Take 1 tablet daily.  Marland Kitchen VITAMIN C 1000 MG tablet Take daily.  Marland Kitchen aspirin 81 MG tablet Take  daily.  Marland Kitchen VITAMIN D Take 5,000 Units by mouth daily.  . folic acid 1 MG tablet Take 3 mg by mouth  daily.  Marland Kitchen gabapentin  600 MG tablet Take 1/2 - 1 tablet 2 - 3  x/day as needed   . PLAQUENIL 200 MG tablet Take by mouth daily. Take 1 tab AM and PM  . IBU 800 MG tablet Take  1 tablet  3 x /day  with Food for Pain & Inflammation  . levothyroxine  137 MCG tablet Take 1 tablet Daily   . Magnesium 500 MG TABS Take 1 tablet  daily.  Marland Kitchen MELATONIN PO Take 1 tablet  at bedtime.  . methotrexate 2.5 MG tablet Take 2.5 mg  1 x/week. Pat. injects 2.5 mg /ml  /week  . olmesartan (BENICAR) 40 MG tablet TAKE 1 TABLET EVERY DAY FOR BLOOD PRESSURE  . omeprazole  40 MG capsule Take 1 capsule 2 x /day   . zinc  50 MG tablet Take 5 daily.     Allergies  Allergen Reactions  . Ace Inhibitors Cough  . Ambien [Zolpidem] Other (See Comments)    Odd Feeling  . Crestor [Rosuvastatin]   . Pravastatin   . Prozac [Fluoxetine Hcl] Other (See Comments)    Decreased libido  . Zoloft [Sertraline Hcl]     PMHx:   Past Medical History:  Diagnosis Date  .  Anemia    Hx of   . Arthritis   . Calculus of bile duct without mention of cholecystitis or obstruction   . Cataract   . GERD (gastroesophageal reflux disease)   . H. pylori infection    Hx of   . Heart murmur   . Hyperlipidemia   . Hypertension   . Hypothyroid   . PUD (peptic ulcer disease)   . Rheumatoid arthritis(714.0)     Immunization History  Administered Date(s) Administered  . Influenza, High Dose Seasonal PF 07/16/2014, 09/17/2015, 07/01/2016, 08/04/2017, 08/25/2018  . Pneumococcal Conjugate-13 10/16/2015  . Pneumococcal Polysaccharide-23 11/08/2012  . Td 11/08/2012  . Zoster 09/17/2015    Past Surgical History:  Procedure Laterality Date  . AV FISTULA REPAIR    . BLADDER SUSPENSION    . BREAST CYST EXCISION Left   . BUNIONECTOMY    . CARPAL TUNNEL RELEASE    . CHOLECYSTECTOMY  09/07/2011  . CYSTECTOMY     left breast  . ERCP  09/29/2011   Procedure: ENDOSCOPIC RETROGRADE CHOLANGIOPANCREATOGRAPHY (ERCP);  Surgeon: Inda Castle, MD;  Location: [facility];  Service: Endoscopy;  Laterality: N/A;  . PILONIDAL CYST EXCISION    . TENOLYSIS  10/26/2011   Procedure: TENDON SHEATH RELEASE/TENOLYSIS;  Surgeon: Wynonia Sours, MD;  Location: [facility];  Service: Orthopedics;  Laterality: Right;  tenosynovectomy removal superficialis slip right index finger  . TONSILLECTOMY    . TOTAL SHOULDER REPLACEMENT Right 02/22/2014   Dr. Enrigue Catena at [facility]  . TRIGGER FINGER RELEASE    . VAGINAL HYSTERECTOMY     ovaries not removed    FHx:    Reviewed / unchanged  SHx:    Reviewed / unchanged   Systems Review:  Constitutional: Denies fever, chills, wt changes, headaches, insomnia, fatigue, night sweats, change in appetite. Eyes: Denies redness, blurred vision, diplopia, discharge, itchy, watery eyes.  ENT: Denies discharge, congestion, post nasal drip, epistaxis, sore throat, earache, hearing loss, dental pain, tinnitus, vertigo, sinus pain, snoring.  CV: Denies chest pain, palpitations, irregular heartbeat, syncope, dyspnea, diaphoresis, orthopnea, PND, claudication or edema. Respiratory: denies cough, dyspnea, DOE, pleurisy, hoarseness, laryngitis, wheezing.  Gastrointestinal: Denies dysphagia, odynophagia, heartburn, reflux, water brash, abdominal pain or cramps, nausea, vomiting, bloating, diarrhea, constipation, hematemesis, melena, hematochezia  or hemorrhoids. Genitourinary: Denies dysuria, frequency, urgency, nocturia, hesitancy, discharge, hematuria or flank pain. Musculoskeletal: Denies arthralgias, myalgias, stiffness, jt. swelling, pain, limping or strain/sprain.  Skin: Denies pruritus, rash, hives, warts, acne, eczema or change in skin lesion(s). Neuro: No weakness, tremor, incoordination, spasms, paresthesia or pain. Psychiatric: Denies confusion, memory loss or sensory loss. Endo: Denies change in weight, skin or hair change.  Heme/Lymph: No excessive bleeding, bruising or enlarged lymph  nodes.  Physical Exam  BP (!) 148/72   Pulse 75   Temp 97.7 F (36.5 C)   Resp 16   Ht 5\' 1"  (1.549 m)   Wt 162 lb 3.2 oz (73.6 kg)   SpO2 99%   BMI 30.65 kg/m   Appears  well nourished, well groomed  and in no distress.  Eyes: PERRLA, EOMs, conjunctiva no swelling or erythema. Sinuses: No frontal/maxillary tenderness ENT/Mouth: EAC's clear, TM's nl w/o erythema, bulging. Nares clear w/o erythema, swelling, exudates. Oropharynx clear without erythema or exudates. Oral hygiene is good. Tongue normal, non obstructing. Hearing intact.  Neck: Supple. Thyroid not palpable. Car 2+/2+ without bruits, nodes or JVD. Chest: Respirations nl with BS clear & equal w/o rales, rhonchi, wheezing or stridor.  Cor: Heart sounds normal w/ regular rate and rhythm without sig. murmurs, gallops, clicks or rubs. Peripheral pulses normal and equal  without edema.  Abdomen: Soft & bowel sounds normal. Non-tender w/o guarding, rebound, hernias, masses or organomegaly.  Lymphatics: Unremarkable.  Musculoskeletal: Full ROM all peripheral extremities, joint stability, 5/5 strength and normal gait.  Skin: Warm, dry without exposed rashes, lesions or ecchymosis apparent.  Neuro: Cranial nerves intact, reflexes equal bilaterally. Sensory-motor testing grossly intact. Tendon reflexes grossly intact.  Pysch: Alert & oriented x 3.  Insight and judgement nl & appropriate. No ideations.  Assessment and Plan:  1. Essential hypertension  - Add Rx Ziac 10  - 1 tab qam - Change Amlodipine 2.5 mg - 1 qpm  - and decrease Olmesartan 40 mg - 1/2 tab qpm  - Continue medication, monitor blood pressure at home.  - Continue DASH diet.  Reminder to go to the ER if any CP,  SOB, nausea, dizziness, severe HA, changes vision/speech.  - CBC with Differential/Platelet - COMPLETE METABOLIC PANEL WITH GFR - Magnesium - TSH - PTH, intact and calcium  2. Hyperlipidemia, mixed  - Continue diet/meds, exercise,& lifestyle  modifications.  - Continue monitor periodic cholesterol/liver & renal functions   - Lipid panel - TSH  3. Abnormal glucose  - Continue diet, exercise  - Lifestyle modifications.  - Monitor appropriate labs  - Hemoglobin A1c - Insulin, random  4. Vitamin D deficiency  - Continue supplementation.  - VITAMIN D 25 Hydroxy   5. Hypothyroidism, unspecified type  - TSH  6. Chronic kidney disease, stage 3a (HCC)  - COMPLETE METABOLIC PANEL WITH GFR - PTH, intact and calcium  7. Aortic atherosclerosis ([place]) by CXR in 2021  - Lipid panel  8. Gastroesophageal reflux disease without esophagitis  - CBC with Differential/Platelet  9. Medication management  - CBC with Differential/Platelet - COMPLETE METABOLIC PANEL WITH GFR - Magnesium - Lipid panel - TSH - Hemoglobin A1c - Insulin, random - VITAMIN D 25 Hydroxy  - PTH, intact and calcium         Discussed  regular exercise, BP monitoring, weight control to achieve/maintain BMI less than 25 and discussed med and SE's. Recommended labs to assess and monitor clinical status with further disposition pending results of labs.  I discussed the assessment and treatment plan with the patient. The patient was provided an opportunity to ask questions and all were answered. The patient agreed with the plan and demonstrated an understanding of the instructions.  I provided over 30 minutes of exam, counseling, chart review and  complex critical decision making.         The patient was advised to call back or seek an in-person evaluation if the symptoms worsen or if the condition fails to improve as anticipated.   Kirtland Bouchard, MD

## 2021-01-20 NOTE — Patient Instructions (Signed)

## 2021-01-21 ENCOUNTER — Ambulatory Visit: Payer: Medicare PPO | Admitting: Internal Medicine

## 2021-01-21 ENCOUNTER — Other Ambulatory Visit: Payer: Self-pay

## 2021-01-21 VITALS — BP 148/72 | HR 75 | Temp 97.7°F | Resp 16 | Ht 61.0 in | Wt 162.2 lb

## 2021-01-21 DIAGNOSIS — E559 Vitamin D deficiency, unspecified: Secondary | ICD-10-CM | POA: Diagnosis not present

## 2021-01-21 DIAGNOSIS — E782 Mixed hyperlipidemia: Secondary | ICD-10-CM | POA: Diagnosis not present

## 2021-01-21 DIAGNOSIS — K219 Gastro-esophageal reflux disease without esophagitis: Secondary | ICD-10-CM

## 2021-01-21 DIAGNOSIS — I700 Atherosclerosis of aorta: Secondary | ICD-10-CM

## 2021-01-21 DIAGNOSIS — Z79899 Other long term (current) drug therapy: Secondary | ICD-10-CM | POA: Diagnosis not present

## 2021-01-21 DIAGNOSIS — R7309 Other abnormal glucose: Secondary | ICD-10-CM | POA: Diagnosis not present

## 2021-01-21 DIAGNOSIS — I10 Essential (primary) hypertension: Secondary | ICD-10-CM

## 2021-01-21 DIAGNOSIS — E039 Hypothyroidism, unspecified: Secondary | ICD-10-CM

## 2021-01-21 DIAGNOSIS — N1831 Chronic kidney disease, stage 3a: Secondary | ICD-10-CM | POA: Diagnosis not present

## 2021-01-21 MED ORDER — BISOPROLOL-HYDROCHLOROTHIAZIDE 10-6.25 MG PO TABS
ORAL_TABLET | ORAL | 1 refills | Status: DC
Start: 2021-01-21 — End: 2021-02-05

## 2021-01-22 ENCOUNTER — Other Ambulatory Visit: Payer: Self-pay | Admitting: Internal Medicine

## 2021-01-22 DIAGNOSIS — G4700 Insomnia, unspecified: Secondary | ICD-10-CM

## 2021-01-22 LAB — COMPLETE METABOLIC PANEL WITH GFR
AG Ratio: 2 (calc) (ref 1.0–2.5)
ALT: 14 U/L (ref 6–29)
AST: 27 U/L (ref 10–35)
Albumin: 4.2 g/dL (ref 3.6–5.1)
Alkaline phosphatase (APISO): 109 U/L (ref 37–153)
BUN: 9 mg/dL (ref 7–25)
CO2: 31 mmol/L (ref 20–32)
Calcium: 11 mg/dL — ABNORMAL HIGH (ref 8.6–10.4)
Chloride: 105 mmol/L (ref 98–110)
Creat: 0.85 mg/dL (ref 0.60–0.93)
GFR, Est African American: 79 mL/min/{1.73_m2} (ref >=60)
GFR, Est Non African American: 68 mL/min/{1.73_m2} (ref >=60)
Globulin: 2.1 g/dL (calc) (ref 1.9–3.7)
Glucose, Bld: 89 mg/dL (ref 65–99)
Potassium: 4.3 mmol/L (ref 3.5–5.3)
Sodium: 142 mmol/L (ref 135–146)
Total Bilirubin: 0.5 mg/dL (ref 0.2–1.2)
Total Protein: 6.3 g/dL (ref 6.1–8.1)

## 2021-01-22 LAB — CBC WITH DIFFERENTIAL/PLATELET
Absolute Monocytes: 623 cells/uL (ref 200–950)
Basophils Absolute: 62 cells/uL (ref 0–200)
Basophils Relative: 1.5 %
Eosinophils Absolute: 312 cells/uL (ref 15–500)
Eosinophils Relative: 7.6 %
HCT: 38.2 % (ref 35.0–45.0)
Hemoglobin: 12.7 g/dL (ref 11.7–15.5)
Lymphs Abs: 1074 cells/uL (ref 850–3900)
MCH: 30.5 pg (ref 27.0–33.0)
MCHC: 33.2 g/dL (ref 32.0–36.0)
MCV: 91.8 fL (ref 80.0–100.0)
MPV: 13 fL — ABNORMAL HIGH (ref 7.5–12.5)
Monocytes Relative: 15.2 %
Neutro Abs: 2030 cells/uL (ref 1500–7800)
Neutrophils Relative %: 49.5 %
Platelets: 181 10*3/uL (ref 140–400)
RBC: 4.16 10*6/uL (ref 3.80–5.10)
RDW: 13.3 % (ref 11.0–15.0)
Total Lymphocyte: 26.2 %
WBC: 4.1 10*3/uL (ref 3.8–10.8)

## 2021-01-22 LAB — LIPID PANEL
Cholesterol: 186 mg/dL (ref <200)
HDL: 76 mg/dL (ref >=50)
LDL Cholesterol (Calc): 94 mg/dL (calc)
Non-HDL Cholesterol (Calc): 110 mg/dL (calc) (ref <130)
Total CHOL/HDL Ratio: 2.4 (calc) (ref <5.0)
Triglycerides: 72 mg/dL (ref <150)

## 2021-01-22 LAB — TSH: TSH: 0.03 mIU/L — ABNORMAL LOW (ref 0.40–4.50)

## 2021-01-22 LAB — INSULIN, RANDOM: Insulin: 6.2 u[IU]/mL

## 2021-01-22 LAB — PTH, INTACT AND CALCIUM
Calcium: 11 mg/dL — ABNORMAL HIGH (ref 8.6–10.4)
PTH: 105 pg/mL — ABNORMAL HIGH (ref 16–77)

## 2021-01-22 LAB — HEMOGLOBIN A1C
Hgb A1c MFr Bld: 5.1 % of total Hgb (ref <5.7)
Mean Plasma Glucose: 100 mg/dL
eAG (mmol/L): 5.5 mmol/L

## 2021-01-22 LAB — VITAMIN D 25 HYDROXY (VIT D DEFICIENCY, FRACTURES): Vit D, 25-Hydroxy: 92 ng/mL (ref 30–100)

## 2021-01-22 LAB — MAGNESIUM: Magnesium: 2.2 mg/dL (ref 1.5–2.5)

## 2021-01-22 MED ORDER — TRAZODONE HCL 150 MG PO TABS: ORAL_TABLET | ORAL | 0 refills | Status: DC

## 2021-01-22 NOTE — Progress Notes (Signed)
============================================================ -   Test results slightly outside the reference range are not unusual. If there is anything important, I will review this with you,  otherwise it is considered normal test values.  If you have further questions,  please do not hesitate to contact me at the office or via My Chart.  ============================================================ ============================================================  -  TSH is slightly low , but not significantly enough to make any changes   - Will continue to monitor a subsequent visits ============================================================ ============================================================  -  Vitamin D = 92 - Excellent   ! ============================================================ ============================================================  -  But Calcium and  PTH levels (That regulate calcium balance) are                                                                                              both elevated , So   - Cut you Vitamin D dose in 1/2 or cut back to 3 x /week ============================================================ ============================================================  -  All Else - CBC - Electrolytes - Liver &  Magnesium    - all  Normal / OK ===========================================================

## 2021-02-05 ENCOUNTER — Other Ambulatory Visit: Payer: Self-pay

## 2021-02-05 ENCOUNTER — Encounter: Payer: Self-pay | Admitting: Adult Health

## 2021-02-05 ENCOUNTER — Ambulatory Visit: Payer: Medicare PPO | Admitting: Adult Health

## 2021-02-05 VITALS — BP 112/60 | HR 41 | Temp 97.3°F | Wt 165.0 lb

## 2021-02-05 DIAGNOSIS — I10 Essential (primary) hypertension: Secondary | ICD-10-CM

## 2021-02-05 DIAGNOSIS — Z79899 Other long term (current) drug therapy: Secondary | ICD-10-CM

## 2021-02-05 MED ORDER — HYDROCHLOROTHIAZIDE 25 MG PO TABS: ORAL_TABLET | ORAL | 0 refills | Status: DC

## 2021-02-05 MED ORDER — BISOPROLOL FUMARATE 5 MG PO TABS: 5.0000 mg | ORAL_TABLET | Freq: Every day | ORAL | 0 refills | Status: DC

## 2021-02-05 NOTE — Patient Instructions (Addendum)
    Olmesartan - continue 20 mg   STOP ziac and amlodipine  Start bisoprolol 5 mg daily - take at night  Start hydrochlorothiazide (HCTZ) - start 1/2 tab only in the morning (water pill)   IF after 1 week blood pressure is too high - can increase olmesartan or HCTZ  Want to see pulse 55+  Don't want to see any blood pressure <110/60

## 2021-02-05 NOTE — Progress Notes (Signed)
Assessment and Plan:  [name] was seen today for medication management.  Diagnoses and all orders for this visit:  Essential hypertension Edema onset after amlodipine 2.5 - STOP Olmesartan 20 mg - continue for now STOP ziac due to bradycardia Sent in bisoprolol 5 mg to start daily in PM; if persistent low pulse reduce to 1/2 tab Start HCTZ 1/2 tab in AM; if persistent edema can increase Monitor blood pressure at home; call if consistently over 140/80 or under 110/60 with any fatigue/dizziness Continue DASH diet.   Reminder to go to the ER if any CP, SOB, nausea, dizziness, severe HA, changes vision/speech, left arm numbness and tingling and jaw pain. Follow up in 2-3 weeks, check BMP/GFR then  -     bisoprolol (ZEBETA) 5 MG tablet; Take 1 tablet (5 mg total) by mouth daily with supper. -     hydrochlorothiazide (HYDRODIURIL) 25 MG tablet; Take 1/2 -1 tab in the morning for swelling and blood pressure. -     BASIC METABOLIC PANEL WITH GFR; Future  Further disposition pending results of labs. Discussed med's effects and SE's.   Over 15 minutes of exam, counseling, chart review, and critical decision making was performed.   Future Appointments  Date Time Provider [place]  02/26/2021  2:00 PM [facility] NURSE [facility] None  07/22/2021 10:00 AM [name], MD [facility] None    ------------------------------------------------------------------------------------------------------------------   HPI BP 112/60   Pulse (!) 41   Temp (!) 97.3 F (36.3 C)   Wt 165 lb (74.8 kg)   SpO2 97%   BMI 31.18 kg/m   75 y.o.female presents with concerns about BP medications.   Currently taking ziac 10/6.25 mg, olmesartan 20 mg (1/2 tab only), amlodipine 2.5 mg daily. Last visit ziac was added, reports has been having edema since amlodipine was added prior to that.   Her blood pressure has been controlled at home, running low, pulses low, today their BP is BP: 112/60 She reports  newly having no energy, discomfort in her chest with attempting to exert herself, has wanted to sit on the couch all day, has had dizziness sitting to standing.   No heart block on last EKG reviewed  Past Medical History:  Diagnosis Date  . Anemia    Hx of   . Arthritis   . Calculus of bile duct without mention of cholecystitis or obstruction   . Cataract   . GERD (gastroesophageal reflux disease)   . H. pylori infection    Hx of   . Heart murmur   . Hyperlipidemia   . Hypertension   . Hypothyroid   . PUD (peptic ulcer disease)   . Rheumatoid arthritis(714.0)      Allergies  Allergen Reactions  . Ace Inhibitors Cough  . Ambien [Zolpidem] Other (See Comments)    Odd Feeling  . Crestor [Rosuvastatin]   . Pravastatin   . Prozac [Fluoxetine Hcl] Other (See Comments)    Decreased libido  . Zoloft [Sertraline Hcl]     Current Outpatient Medications on File Prior to Visit  Medication Sig  . alendronate (FOSAMAX) 70 MG tablet Take 1 tablet once weekly with a full glass of water on an empty stomach for 30 minutes.  . ALPRAZolam (XANAX) 1 MG tablet Take      1/2 to 1 tablet      at Bedtime     ONLY if needed for  Sleep and please try to limit to 5 days /week to avoid addiction  .  Ascorbic Acid (VITAMIN C) 1000 MG tablet Take 1,000 mg by mouth daily.  Marland Kitchen aspirin 81 MG tablet Take 81 mg by mouth daily.  . Cholecalciferol (VITAMIN D PO) Take 5,000 Units by mouth daily.  . folic acid (FOLVITE) 1 MG tablet Take 3 mg by mouth daily.  Marland Kitchen gabapentin (NEURONTIN) 600 MG tablet TAKE 1/2 TO 1 TABLET 2 TO 3 TIMES DAILY AS NEEDED FOR PAIN  . hydroxychloroquine (PLAQUENIL) 200 MG tablet Take by mouth daily. Take 1 tab AM and PM  . IBU 800 MG tablet Take  1 tablet  3 x /day  with Food for Pain & Inflammation  . levothyroxine (SYNTHROID) 137 MCG tablet Take      1 tablet       Daily      on an empty stomach with only water for 30 minutes & no Antacid meds, Calcium or Magnesium for 4 hours & avoid  Biotin  . Magnesium 500 MG TABS Take 1 tablet by mouth daily.  Marland Kitchen MELATONIN PO Take 1 tablet by mouth at bedtime.  . methotrexate (RHEUMATREX) 2.5 MG tablet Take 2.5 mg by mouth once a week. Patient injects 2.5 mg /ml 1 ml [place] weekly.  Marland Kitchen olmesartan (BENICAR) 40 MG tablet TAKE 1 TABLET EVERY DAY FOR BLOOD PRESSURE (Patient taking differently: Taking 20mg  daily)  . omeprazole (PRILOSEC) 40 MG capsule TAKE 1 CAPSULE TWICE DAILY FOR ACID REFLUX  . traZODone (DESYREL) 150 MG tablet Take  1 tablet  1 hour  before Bedtime  as needed for Sleep  . zinc gluconate 50 MG tablet Take 50 mg by mouth daily.   No current facility-administered medications on file prior to visit.    ROS: all negative except above.   Physical Exam:  BP 112/60   Pulse (!) 41   Temp (!) 97.3 F (36.3 C)   Wt 165 lb (74.8 kg)   SpO2 97%   BMI 31.18 kg/m   General Appearance: Well nourished, in no apparent distress. Eyes: PERRLA, EOMs, conjunctiva no swelling or erythema Sinuses: No Frontal/maxillary tenderness ENT/Mouth: Ext aud canals clear, TMs without erythema, bulging. No erythema, swelling, or exudate on post pharynx.  Tonsils not swollen or erythematous. Hearing normal.  Neck: Supple, thyroid normal.  Respiratory: Respiratory effort normal, BS equal bilaterally without rales, rhonchi, wheezing or stridor.  Cardio: slow heart rate, normal S1/S2 with no MRGs. Brisk peripheral pulses bil 1+ non-pitting edema to ankles.  Abdomen: Soft, + BS.  Non tender, no guarding, rebound, hernias, masses. Lymphatics: Non tender without lymphadenopathy.  Musculoskeletal: No obvious deformity, slow steady gait.  Skin: Warm, dry without rashes, lesions, ecchymosis.  Neuro: Normal muscle tone, no cerebellar symptoms. Sensation intact.  Psych: Awake and oriented X 3, normal affect, Insight and Judgment appropriate.     Izora Ribas, NP 4:21 PM [facility]

## 2021-02-10 ENCOUNTER — Other Ambulatory Visit: Payer: Self-pay | Admitting: Internal Medicine

## 2021-02-10 DIAGNOSIS — E039 Hypothyroidism, unspecified: Secondary | ICD-10-CM

## 2021-02-26 ENCOUNTER — Ambulatory Visit (INDEPENDENT_AMBULATORY_CARE_PROVIDER_SITE_OTHER): Payer: Medicare PPO

## 2021-02-26 ENCOUNTER — Other Ambulatory Visit: Payer: Self-pay

## 2021-02-26 ENCOUNTER — Other Ambulatory Visit: Payer: Self-pay | Admitting: Adult Health

## 2021-02-26 DIAGNOSIS — I10 Essential (primary) hypertension: Secondary | ICD-10-CM

## 2021-02-26 MED ORDER — HYDROCHLOROTHIAZIDE 12.5 MG PO TABS: 12.5000 mg | ORAL_TABLET | Freq: Every day | ORAL | 1 refills | Status: DC

## 2021-02-26 MED ORDER — OLMESARTAN MEDOXOMIL 20 MG PO TABS: ORAL_TABLET | ORAL | 1 refills | Status: DC

## 2021-02-26 NOTE — Progress Notes (Signed)
Patient presents to the office for a nurse visit for BP check and have labs done. Patient states that she is taking 1/2 tablet of HTCZ in the morning and 20mg  of Olmesartan in the evening. Did state that she isn't taking the Bisoprolol. Has been checking BP at home and fluctuates between 950'H-225'J and Diastolic staying mostly in the 60's.

## 2021-02-27 LAB — BASIC METABOLIC PANEL WITH GFR
BUN: 10 mg/dL (ref 7–25)
CO2: 32 mmol/L (ref 20–32)
Calcium: 10.3 mg/dL (ref 8.6–10.4)
Chloride: 99 mmol/L (ref 98–110)
Creat: 0.88 mg/dL (ref 0.60–0.93)
GFR, Est African American: 76 mL/min/{1.73_m2} (ref >=60)
GFR, Est Non African American: 65 mL/min/{1.73_m2} (ref >=60)
Glucose, Bld: 92 mg/dL (ref 65–99)
Potassium: 4.3 mmol/L (ref 3.5–5.3)
Sodium: 136 mmol/L (ref 135–146)

## 2021-03-03 ENCOUNTER — Other Ambulatory Visit: Payer: Self-pay | Admitting: Internal Medicine

## 2021-03-03 DIAGNOSIS — G4700 Insomnia, unspecified: Secondary | ICD-10-CM

## 2021-03-03 DIAGNOSIS — I10 Essential (primary) hypertension: Secondary | ICD-10-CM

## 2021-03-03 MED ORDER — TRAZODONE HCL 150 MG PO TABS: ORAL_TABLET | ORAL | 3 refills | Status: DC

## 2021-03-03 MED ORDER — HYDROCHLOROTHIAZIDE 12.5 MG PO TABS
ORAL_TABLET | ORAL | 3 refills | Status: DC
Start: 2021-03-03 — End: 2021-03-03

## 2021-03-03 MED ORDER — HYDROCHLOROTHIAZIDE 12.5 MG PO TABS: ORAL_TABLET | ORAL | 3 refills | Status: DC

## 2021-03-04 ENCOUNTER — Ambulatory Visit: Payer: Medicare PPO | Admitting: Internal Medicine

## 2021-03-04 ENCOUNTER — Encounter: Payer: Self-pay | Admitting: Internal Medicine

## 2021-03-04 ENCOUNTER — Other Ambulatory Visit: Payer: Self-pay

## 2021-03-04 VITALS — BP 140/76 | HR 54 | Temp 97.3°F | Resp 16 | Ht 61.0 in | Wt 156.8 lb

## 2021-03-04 DIAGNOSIS — R42 Dizziness and giddiness: Secondary | ICD-10-CM

## 2021-03-04 DIAGNOSIS — Z79899 Other long term (current) drug therapy: Secondary | ICD-10-CM | POA: Diagnosis not present

## 2021-03-04 DIAGNOSIS — R55 Syncope and collapse: Secondary | ICD-10-CM | POA: Diagnosis not present

## 2021-03-04 DIAGNOSIS — R946 Abnormal results of thyroid function studies: Secondary | ICD-10-CM | POA: Diagnosis not present

## 2021-03-04 NOTE — Progress Notes (Signed)
Future Appointments  Date Time Provider [place]  03/04/2021  4:00 PM [name], MD [facility] None  07/22/2021 10:00 AM [name], MD [facility] None    History of Present Illness:      Patient is a very nice 75 yo MWF who presents acute for c/o of an episode of fainting yesterday evening.  Her husband was with her & gently laid her down as she apparently completely fainted and quickly revived.  She denise any awareness of palpitations or other neurological sx's.       She reports episodes of postural lightheadedness or "dizziness" & especially if she hyper-extends her neck looking up and backward. She also relates sometimes after this maneuver she feels "disoriented " for 1-2 minutes. She does not definitely elicit a description of "classic" vertigo.  She had prior Neuro eval by Dr Rexene Alberts for vague neuro sx's with a negative Neuro w/u.       Patient had recent lab slightly decreased TSH , so she decided to increase her L-Thyroxine 100 mcg daily to take an extra 1/2 tab 2 x /week on her own accord . When she was informed that a low TSH implies excess thyroid hormone in blood and she should have decreased the dose, she became very defensive. She was advised please not to make future thyroid adjustment on her own accord without provider input.    Medications  .  alendronate (FOSAMAX) 70 MG tablet, Take 1 tablet once weekly  .  levothyroxine  137 MCG tablet, TAKE 1 TAB DAILY  .  hydrochlorothiazide 12.5 MG tablet, Take  1 tablet  Daily  for BP & Fluid Retention .  olmesartan 20 MG tablet, TAKE 1 TABLET EVERY DAY FOR BLOOD PRESSURE .  aspirin 81 MG tablet, Take 81 mg by mouth daily. Marland Kitchen  ibuprofen  800 MG tablet, TAKE 1 TABLET THREE TIMES DAILY AS NEEDED  .  folic acid (FOLVITE) 1 MG tablet, Take 3 mg  daily. Marland Kitchen  ALPRAZolam (XANAX) 1 MG tablet, Take      1/2 to 1 tablet      at Bedtime     ONLY if needed  .  Ascorbic Acid (VITAMIN C) 1000 MG tablet, Take    daily. .  Cholecalciferol (VITAMIN D PO), Take 5,000 Units  daily. Marland Kitchen  gabapentin  600 MG tablet, TAKE 1/2 TO 1 TABLET 2 TO 3 TIMES DAILY AS NEEDED FOR PAIN .  hydroxychloroquine (PLAQUENIL) 200 MG tablet, Take  daily. Take 1 tab AM and PM .  Magnesium 500 MG TABS, Take 1 tablet  daily. Marland Kitchen  MELATONIN PO, Take 1 tablet  at bedtime. .  methotrexate  2.5 MG tablet, Take 2.5 mg  once a week. Patient injects 2.5 mg /ml 1 ml [place] weekly. Marland Kitchen  omeprazole  40 MG capsule, TAKE 1 CAPSULE TWICE DAILY FOR ACID REFLUX .  traZODone  150 MG tablet, Take  1 tablet  1 hour  before Bedtime  as needed for Sleep .  zinc  50 MG tablet, Take  daily.  Problem list She has Essential hypertension; Rheumatoid arthritis ([place]); Hyperlipidemia, mixed; Gastroesophageal reflux disease without esophagitis; Personal history of colonic polyps; Vitamin D deficiency; Abnormal glucose; Medication management; Hypothyroidism; Osteoporosis; Hyperparathyroidism ([place]); Overweight (BMI 25.0-29.9); Insomnia; Neuropathy; Aortic atherosclerosis ([place]) by CXR in 2021; and Depression, major, single episode, in partial remission ([place]) on their problem list.   Observations/Objective:   BP 140/76 sitting & 146/70 standing  P 54  T  97.3 F   R 16   Ht 5\' 1"    Wt 156 lb 12.8 oz (   SpO2 98%   BMI 29.63    Recheck Postural sitting BP 123/79  P 56     &     Standing BP 162(!) /76    P 62   By wrist cuff  HEENT - WNL. Neck - supple.  Chest - Clear equal BS. Cor - Nl HS. RRR w/o sig MGR. PP 1(+). No edema. MS- FROM w/o deformities.  Gait Nl. Neuro -  Nl w/o focal abnormalities.  Assessment and Plan:  1. Postural dizziness with presyncope  - CBC with Differential/Platelet - COMPLETE METABOLIC PANEL WITH GFR  2. Abnormal thyroid function test  - TSH  3. Medication management  - CBC with Differential/Platelet - COMPLETE METABOLIC PANEL WITH GFR - TSH      Follow Up Instructions:       I discussed the assessment and  treatment plan with the patient & spouse. They  were provided an opportunity to ask questions and all were answered.       If current labs are Normal, they were advise plan to refer to ENT for ENG to evaluate for peripheral vs central Vertigo.  And if that was negative to consider next step referral for cardiology for "tilt test" & arrhythmia monitoring.      They agreed with the plan and demonstrated an understanding of the instructions.       The patient was advised to call back or seek an in-person evaluation if the symptoms worsen or if the condition fails to improve.   Kirtland Bouchard, MD

## 2021-03-05 ENCOUNTER — Other Ambulatory Visit: Payer: Self-pay | Admitting: Internal Medicine

## 2021-03-05 DIAGNOSIS — R55 Syncope and collapse: Secondary | ICD-10-CM

## 2021-03-05 DIAGNOSIS — R42 Dizziness and giddiness: Secondary | ICD-10-CM

## 2021-03-05 LAB — COMPLETE METABOLIC PANEL WITH GFR
AG Ratio: 2.3 (calc) (ref 1.0–2.5)
ALT: 16 U/L (ref 6–29)
AST: 25 U/L (ref 10–35)
Albumin: 4.2 g/dL (ref 3.6–5.1)
Alkaline phosphatase (APISO): 92 U/L (ref 37–153)
BUN: 9 mg/dL (ref 7–25)
CO2: 32 mmol/L (ref 20–32)
Calcium: 10.5 mg/dL — ABNORMAL HIGH (ref 8.6–10.4)
Chloride: 98 mmol/L (ref 98–110)
Creat: 0.91 mg/dL (ref 0.60–0.93)
GFR, Est African American: 73 mL/min/{1.73_m2} (ref >=60)
GFR, Est Non African American: 63 mL/min/{1.73_m2} (ref >=60)
Globulin: 1.8 g/dL (calc) — ABNORMAL LOW (ref 1.9–3.7)
Glucose, Bld: 127 mg/dL — ABNORMAL HIGH (ref 65–99)
Potassium: 4.2 mmol/L (ref 3.5–5.3)
Sodium: 134 mmol/L — ABNORMAL LOW (ref 135–146)
Total Bilirubin: 0.5 mg/dL (ref 0.2–1.2)
Total Protein: 6 g/dL — ABNORMAL LOW (ref 6.1–8.1)

## 2021-03-05 LAB — CBC WITH DIFFERENTIAL/PLATELET
Absolute Monocytes: 439 cells/uL (ref 200–950)
Basophils Absolute: 39 cells/uL (ref 0–200)
Basophils Relative: 0.9 %
Eosinophils Absolute: 138 cells/uL (ref 15–500)
Eosinophils Relative: 3.2 %
HCT: 37.7 % (ref 35.0–45.0)
Hemoglobin: 12.6 g/dL (ref 11.7–15.5)
Lymphs Abs: 916 cells/uL (ref 850–3900)
MCH: 30.3 pg (ref 27.0–33.0)
MCHC: 33.4 g/dL (ref 32.0–36.0)
MCV: 90.6 fL (ref 80.0–100.0)
MPV: 13.4 fL — ABNORMAL HIGH (ref 7.5–12.5)
Monocytes Relative: 10.2 %
Neutro Abs: 2769 cells/uL (ref 1500–7800)
Neutrophils Relative %: 64.4 %
Platelets: 152 10*3/uL (ref 140–400)
RBC: 4.16 10*6/uL (ref 3.80–5.10)
RDW: 13.1 % (ref 11.0–15.0)
Total Lymphocyte: 21.3 %
WBC: 4.3 10*3/uL (ref 3.8–10.8)

## 2021-03-05 LAB — TSH: TSH: 0.06 mIU/L — ABNORMAL LOW (ref 0.40–4.50)

## 2021-03-05 NOTE — Progress Notes (Signed)
============================================================ -   Test results slightly outside the reference range are not unusual. If there is anything important, I will review this with you,  otherwise it is considered normal test values.  If you have further questions,  please do not hesitate to contact me at the office or via My Chart.  ============================================================ ============================================================  -  TSH is Very low in Blood with means                                                  Thyroid hormone level in blood is too high and   can cause complications with heart among other problems when too high.   - So . . . . Marland Kitchen Please  decrease dose to     5 & 1/2    tablets /week by taking                                  -  1/2 tablet          3 x /week on Mon Wed & Fri and                                   - 1 whole tablet   4 x /week on Tues  Thurs  Sat & Sun  ============================================================ ============================================================  -  CBC and Kidneys - Electrolytes - Liver - all  Normal / OK ============================================================  - So as per discussion will request ENT consultation for                                   ENG = Electronystagmogram to evaluate for                                                                   Central (brain) vs Peripheral Vertigo ============================================================ ============================================================

## 2021-03-30 DIAGNOSIS — H903 Sensorineural hearing loss, bilateral: Secondary | ICD-10-CM | POA: Diagnosis not present

## 2021-03-30 DIAGNOSIS — R55 Syncope and collapse: Secondary | ICD-10-CM | POA: Insufficient documentation

## 2021-03-30 DIAGNOSIS — R42 Dizziness and giddiness: Secondary | ICD-10-CM | POA: Diagnosis not present

## 2021-04-07 NOTE — Progress Notes (Signed)
Future Appointments  Date Time Provider [place]  04/08/2021 11:30 AM [name], MD [facility] None  07/22/2021 -CPE  10:00 AM [name], MD [facility] None    History of Present Illness:     Patient is a very nice 75 yo MWF with hx/ of recent presyncopal episode with associated sense of dysequilibrium and negative /Nl CBC & CMET and patient was referred to [facility].  Recent suppressed TSH  0.06  (Nl 0.40-4.5)  and advised to taper her levothyroxine 137 mcg (8 tabs/week) down  to 1 tablet 4 x /week & 1/2 tab 3 x /week = 5.5 tabs/week  Medications  Current Outpatient Medications (Endocrine & Metabolic):  .  alendronate (FOSAMAX) 70 MG tablet, Take 1 tablet once weekly with a full glass of water on an empty stomach for 30 minutes. Marland Kitchen  levothyroxine  137 MCG tablet, TAKE 1 TAB DAILY  .  hydrochlorothiazide (HYDRODIURIL) 12.5 MG tablet, Take  1 tablet  Daily  for BP & Fluid Retention .  olmesartan (BENICAR) 20 MG tablet, TAKE 1 TABLET EVERY DAY FOR BLOOD PRESSURE  .  aspirin 81 MG tablet, Take 81 mg by mouth daily. Marland Kitchen  ibuprofen (IBU) 800 MG tablet, TAKE 1 TABLET THREE TIMES DAILY WITH FOOD AS NEEDED FOR PAIN AND INFLAMMATION  Current Outpatient Medications (Hematological):  .  folic acid (FOLVITE) 1 MG tablet, Take 3 mg by mouth daily.  Current Outpatient Medications (Other):  Marland Kitchen  ALPRAZolam (XANAX) 1 MG tablet, Take      1/2 to 1 tablet      at Bedtime     ONLY if needed for  Sleep and please try to limit to 5 days /week to avoid addiction .  Ascorbic Acid (VITAMIN C) 1000 MG tablet, Take 1,000 mg by mouth daily. .  Cholecalciferol (VITAMIN D PO), Take 5,000 Units by mouth daily. Marland Kitchen  gabapentin (NEURONTIN) 600 MG tablet, TAKE 1/2 TO 1 TABLET 2 TO 3 TIMES DAILY AS NEEDED FOR PAIN .  hydroxychloroquine (PLAQUENIL) 200 MG tablet, Take by mouth daily. Take 1 tab AM and PM .  Magnesium 500 MG TABS, Take 1 tablet by mouth daily. Marland Kitchen  MELATONIN PO, Take 1 tablet  by mouth at bedtime. .  methotrexate (RHEUMATREX) 2.5 MG tablet, Take 2.5 mg by mouth once a week. Patient injects 2.5 mg /ml 1 ml [place] weekly. Marland Kitchen  omeprazole (PRILOSEC) 40 MG capsule, TAKE 1 CAPSULE TWICE DAILY FOR ACID REFLUX .  traZODone (DESYREL) 150 MG tablet, Take  1 tablet  1 hour  before Bedtime  as needed for Sleep .  zinc gluconate 50 MG tablet, Take 50 mg by mouth daily.  Problem list She has Essential hypertension; Rheumatoid arthritis ([place]); Hyperlipidemia, mixed; Gastroesophageal reflux disease without esophagitis; Personal history of colonic polyps; Vitamin D deficiency; Abnormal glucose; Medication management; Hypothyroidism; Osteoporosis; Hyperparathyroidism ([place]); Overweight (BMI 25.0-29.9); Insomnia; Neuropathy; Aortic atherosclerosis ([place]) by CXR in 2021; and Depression, major, single episode, in partial remission ([place]) on their problem list.   Observations/Objective:  Resp 16   Ht 5\' 1"  (1.549 m)   Wt 154 lb 9.6 oz (70.1 kg)   SpO2 99%   BMI 29.21 kg/m   HEENT - WNL. Neck - supple.  Chest - Clear equal BS. Cor - Nl HS. RRR w/o sig MGR. PP 1(+). No edema. MS- FROM w/o deformities.  Gait Nl. Neuro -  Nl w/o focal abnormalities.   Assessment and Plan:  1. Postural dizziness with  presyncope   2. Hypothyroidism  - TSH  3. Medication management  - TSH =====================================================  Follow Up Instructions:        I discussed the assessment and treatment plan with the patient. The patient was provided an opportunity to ask questions and all were answered. The patient agreed with the plan and demonstrated an understanding of the instructions.       The patient was advised to call back or seek an in-person evaluation if the symptoms worsen or if the condition fails to improve as anticipated.    Kirtland Bouchard, MD

## 2021-04-08 ENCOUNTER — Encounter: Payer: Self-pay | Admitting: Internal Medicine

## 2021-04-08 ENCOUNTER — Other Ambulatory Visit: Payer: Self-pay

## 2021-04-08 ENCOUNTER — Ambulatory Visit (INDEPENDENT_AMBULATORY_CARE_PROVIDER_SITE_OTHER): Payer: Medicare PPO | Admitting: Internal Medicine

## 2021-04-08 VITALS — BP 130/70 | HR 58 | Temp 97.9°F | Resp 16 | Ht 61.0 in | Wt 154.6 lb

## 2021-04-08 DIAGNOSIS — R42 Dizziness and giddiness: Secondary | ICD-10-CM

## 2021-04-08 DIAGNOSIS — Z79899 Other long term (current) drug therapy: Secondary | ICD-10-CM

## 2021-04-08 DIAGNOSIS — E039 Hypothyroidism, unspecified: Secondary | ICD-10-CM | POA: Diagnosis not present

## 2021-04-08 DIAGNOSIS — R55 Syncope and collapse: Secondary | ICD-10-CM

## 2021-04-09 LAB — TSH: TSH: 0.4 mIU/L (ref 0.40–4.50)

## 2021-04-09 NOTE — Progress Notes (Signed)
============================================================ -   Test results slightly outside the reference range are not unusual. If there is anything important, I will review this with you,  otherwise it is considered normal test values.  If you have further questions,  please do not hesitate to contact me at the office or via My Chart.  ============================================================ ============================================================  -  TSH - Thyroid level is in Normal Range - Please Keep dose same   ============================================================ ============================================================

## 2021-04-13 DIAGNOSIS — Z79899 Other long term (current) drug therapy: Secondary | ICD-10-CM | POA: Diagnosis not present

## 2021-04-13 DIAGNOSIS — M2550 Pain in unspecified joint: Secondary | ICD-10-CM | POA: Diagnosis not present

## 2021-04-13 DIAGNOSIS — M0609 Rheumatoid arthritis without rheumatoid factor, multiple sites: Secondary | ICD-10-CM | POA: Diagnosis not present

## 2021-04-13 DIAGNOSIS — Z6828 Body mass index (BMI) 28.0-28.9, adult: Secondary | ICD-10-CM | POA: Diagnosis not present

## 2021-04-13 DIAGNOSIS — E663 Overweight: Secondary | ICD-10-CM | POA: Diagnosis not present

## 2021-04-13 DIAGNOSIS — M150 Primary generalized (osteo)arthritis: Secondary | ICD-10-CM | POA: Diagnosis not present

## 2021-06-05 ENCOUNTER — Emergency Department (HOSPITAL_COMMUNITY): Payer: Medicare PPO

## 2021-06-05 ENCOUNTER — Encounter (HOSPITAL_COMMUNITY): Payer: Self-pay

## 2021-06-05 ENCOUNTER — Other Ambulatory Visit: Payer: Self-pay

## 2021-06-05 ENCOUNTER — Inpatient Hospital Stay (HOSPITAL_COMMUNITY)
Admission: EM | Admit: 2021-06-05 | Discharge: 2021-06-10 | DRG: 178 | Disposition: A | Discharge status: Home / Self Care | Payer: Medicare PPO | Attending: Family Medicine | Admitting: Family Medicine

## 2021-06-05 DIAGNOSIS — E860 Dehydration: Secondary | ICD-10-CM | POA: Diagnosis present

## 2021-06-05 DIAGNOSIS — Z79890 Hormone replacement therapy (postmenopausal): Secondary | ICD-10-CM

## 2021-06-05 DIAGNOSIS — R42 Dizziness and giddiness: Secondary | ICD-10-CM | POA: Diagnosis not present

## 2021-06-05 DIAGNOSIS — K219 Gastro-esophageal reflux disease without esophagitis: Secondary | ICD-10-CM | POA: Diagnosis present

## 2021-06-05 DIAGNOSIS — R7881 Bacteremia: Secondary | ICD-10-CM | POA: Diagnosis present

## 2021-06-05 DIAGNOSIS — B957 Other staphylococcus as the cause of diseases classified elsewhere: Secondary | ICD-10-CM | POA: Diagnosis present

## 2021-06-05 DIAGNOSIS — D696 Thrombocytopenia, unspecified: Secondary | ICD-10-CM | POA: Diagnosis present

## 2021-06-05 DIAGNOSIS — M069 Rheumatoid arthritis, unspecified: Secondary | ICD-10-CM | POA: Diagnosis present

## 2021-06-05 DIAGNOSIS — E039 Hypothyroidism, unspecified: Secondary | ICD-10-CM | POA: Diagnosis present

## 2021-06-05 DIAGNOSIS — R059 Cough, unspecified: Secondary | ICD-10-CM | POA: Diagnosis not present

## 2021-06-05 DIAGNOSIS — U071 COVID-19: Secondary | ICD-10-CM | POA: Diagnosis present

## 2021-06-05 DIAGNOSIS — Z96611 Presence of right artificial shoulder joint: Secondary | ICD-10-CM | POA: Diagnosis present

## 2021-06-05 DIAGNOSIS — I10 Essential (primary) hypertension: Secondary | ICD-10-CM | POA: Diagnosis present

## 2021-06-05 DIAGNOSIS — E785 Hyperlipidemia, unspecified: Secondary | ICD-10-CM | POA: Diagnosis present

## 2021-06-05 DIAGNOSIS — R0902 Hypoxemia: Secondary | ICD-10-CM | POA: Diagnosis not present

## 2021-06-05 DIAGNOSIS — E782 Mixed hyperlipidemia: Secondary | ICD-10-CM | POA: Diagnosis present

## 2021-06-05 DIAGNOSIS — Z87891 Personal history of nicotine dependence: Secondary | ICD-10-CM | POA: Diagnosis not present

## 2021-06-05 DIAGNOSIS — Z79899 Other long term (current) drug therapy: Secondary | ICD-10-CM | POA: Diagnosis not present

## 2021-06-05 DIAGNOSIS — Z28310 Unvaccinated for covid-19: Secondary | ICD-10-CM | POA: Diagnosis not present

## 2021-06-05 DIAGNOSIS — J029 Acute pharyngitis, unspecified: Secondary | ICD-10-CM

## 2021-06-05 DIAGNOSIS — R0602 Shortness of breath: Secondary | ICD-10-CM | POA: Diagnosis not present

## 2021-06-05 DIAGNOSIS — Z888 Allergy status to other drugs, medicaments and biological substances status: Secondary | ICD-10-CM

## 2021-06-05 DIAGNOSIS — D849 Immunodeficiency, unspecified: Secondary | ICD-10-CM | POA: Diagnosis present

## 2021-06-05 DIAGNOSIS — E871 Hypo-osmolality and hyponatremia: Secondary | ICD-10-CM

## 2021-06-05 DIAGNOSIS — Z8249 Family history of ischemic heart disease and other diseases of the circulatory system: Secondary | ICD-10-CM | POA: Diagnosis not present

## 2021-06-05 DIAGNOSIS — R531 Weakness: Secondary | ICD-10-CM

## 2021-06-05 DIAGNOSIS — Z8711 Personal history of peptic ulcer disease: Secondary | ICD-10-CM

## 2021-06-05 LAB — COMPREHENSIVE METABOLIC PANEL
ALT: 24 U/L (ref 0–44)
AST: 48 U/L — ABNORMAL HIGH (ref 15–41)
Albumin: 4 g/dL (ref 3.5–5.0)
Alkaline Phosphatase: 82 U/L (ref 38–126)
Anion gap: 7 (ref 5–15)
BUN: 11 mg/dL (ref 8–23)
CO2: 28 mmol/L (ref 22–32)
Calcium: 9.6 mg/dL (ref 8.9–10.3)
Chloride: 94 mmol/L — ABNORMAL LOW (ref 98–111)
Creatinine, Ser: 0.78 mg/dL (ref 0.44–1.00)
GFR, Estimated: >60 mL/min (ref >60)
Glucose, Bld: 103 mg/dL — ABNORMAL HIGH (ref 70–99)
Potassium: 4.2 mmol/L (ref 3.5–5.1)
Sodium: 129 mmol/L — ABNORMAL LOW (ref 135–145)
Total Bilirubin: 0.9 mg/dL (ref 0.3–1.2)
Total Protein: 6.2 g/dL — ABNORMAL LOW (ref 6.5–8.1)

## 2021-06-05 LAB — RESP PANEL BY RT-PCR (FLU A&B, COVID) ARPGX2
Influenza A by PCR: NEGATIVE
Influenza B by PCR: NEGATIVE
SARS Coronavirus 2 by RT PCR: POSITIVE — AB

## 2021-06-05 LAB — CBC WITH DIFFERENTIAL/PLATELET
Abs Immature Granulocytes: 0.05 10*3/uL (ref 0.00–0.07)
Basophils Absolute: 0 10*3/uL (ref 0.0–0.1)
Basophils Relative: 0 %
Eosinophils Absolute: 0 10*3/uL (ref 0.0–0.5)
Eosinophils Relative: 0 %
HCT: 37.4 % (ref 36.0–46.0)
Hemoglobin: 12.6 g/dL (ref 12.0–15.0)
Immature Granulocytes: 1 %
Lymphocytes Relative: 2 %
Lymphs Abs: 0.2 10*3/uL — ABNORMAL LOW (ref 0.7–4.0)
MCH: 31.5 pg (ref 26.0–34.0)
MCHC: 33.7 g/dL (ref 30.0–36.0)
MCV: 93.5 fL (ref 80.0–100.0)
Monocytes Absolute: 0.6 10*3/uL (ref 0.1–1.0)
Monocytes Relative: 6 %
Neutro Abs: 9.7 10*3/uL — ABNORMAL HIGH (ref 1.7–7.7)
Neutrophils Relative %: 91 %
Platelets: 102 10*3/uL — ABNORMAL LOW (ref 150–400)
RBC: 4 MIL/uL (ref 3.87–5.11)
RDW: 14.3 % (ref 11.5–15.5)
WBC: 10.5 10*3/uL (ref 4.0–10.5)
nRBC: 0 % (ref 0.0–0.2)

## 2021-06-05 LAB — LACTATE DEHYDROGENASE: LDH: 315 U/L — ABNORMAL HIGH (ref 98–192)

## 2021-06-05 LAB — FIBRINOGEN: Fibrinogen: 227 mg/dL (ref 210–475)

## 2021-06-05 LAB — D-DIMER, QUANTITATIVE: D-Dimer, Quant: 0.94 ug/mL-FEU — ABNORMAL HIGH (ref 0.00–0.50)

## 2021-06-05 LAB — TRIGLYCERIDES: Triglycerides: 28 mg/dL (ref <150)

## 2021-06-05 LAB — C-REACTIVE PROTEIN: CRP: 8.3 mg/dL — ABNORMAL HIGH (ref <1.0)

## 2021-06-05 LAB — LACTIC ACID, PLASMA: Lactic Acid, Venous: 0.7 mmol/L (ref 0.5–1.9)

## 2021-06-05 LAB — PROCALCITONIN: Procalcitonin: <0.1 ng/mL

## 2021-06-05 LAB — FERRITIN: Ferritin: 264 ng/mL (ref 11–307)

## 2021-06-05 MED ORDER — SODIUM CHLORIDE 0.9 % IV SOLN
200.0000 mg | Freq: Once | INTRAVENOUS | Status: AC
  Administered 2021-06-05: 200 mg via INTRAVENOUS
  Filled 2021-06-05: qty 200

## 2021-06-05 MED ORDER — SODIUM CHLORIDE 0.9 % IV SOLN
1000.0000 mL | INTRAVENOUS | Status: DC
  Administered 2021-06-05 – 2021-06-06 (×2): 1000 mL via INTRAVENOUS

## 2021-06-05 MED ORDER — HYDROXYCHLOROQUINE SULFATE 200 MG PO TABS: 200.0000 mg | ORAL_TABLET | Freq: Two times a day (BID) | ORAL | Status: DC

## 2021-06-05 MED ORDER — TRAZODONE HCL 100 MG PO TABS
150.0000 mg | ORAL_TABLET | Freq: Every day | ORAL | Status: DC
  Administered 2021-06-05 – 2021-06-09 (×5): 150 mg via ORAL
  Filled 2021-06-05 (×5): qty 2

## 2021-06-05 MED ORDER — IRBESARTAN 150 MG PO TABS
150.0000 mg | ORAL_TABLET | Freq: Every day | ORAL | Status: DC
  Administered 2021-06-06: 150 mg via ORAL
  Filled 2021-06-05 (×2): qty 1

## 2021-06-05 MED ORDER — HYDROCOD POLST-CPM POLST ER 10-8 MG/5ML PO SUER: 5.0000 mL | Freq: Two times a day (BID) | ORAL | Status: DC | PRN

## 2021-06-05 MED ORDER — ASCORBIC ACID 500 MG PO TABS
1000.0000 mg | ORAL_TABLET | Freq: Every day | ORAL | Status: DC
  Administered 2021-06-06 – 2021-06-09 (×4): 1000 mg via ORAL
  Filled 2021-06-05 (×4): qty 2

## 2021-06-05 MED ORDER — AMOXICILLIN 500 MG PO CAPS
500.0000 mg | ORAL_CAPSULE | Freq: Three times a day (TID) | ORAL | Status: DC
  Administered 2021-06-05 – 2021-06-06 (×3): 500 mg via ORAL
  Filled 2021-06-05: qty 2
  Filled 2021-06-05: qty 1
  Filled 2021-06-05: qty 2
  Filled 2021-06-05: qty 1
  Filled 2021-06-05: qty 2
  Filled 2021-06-05: qty 1
  Filled 2021-06-05 (×2): qty 2
  Filled 2021-06-05: qty 1

## 2021-06-05 MED ORDER — SODIUM CHLORIDE 0.9 % IV SOLN
1000.0000 mL | INTRAVENOUS | Status: DC
  Administered 2021-06-05: 1000 mL via INTRAVENOUS

## 2021-06-05 MED ORDER — LEVOTHYROXINE SODIUM 137 MCG PO TABS
137.0000 ug | ORAL_TABLET | Freq: Every day | ORAL | Status: DC
  Administered 2021-06-06 – 2021-06-10 (×5): 137 ug via ORAL
  Filled 2021-06-05 (×5): qty 1

## 2021-06-05 MED ORDER — HYDRALAZINE HCL 20 MG/ML IJ SOLN: 10.0000 mg | INTRAMUSCULAR | Status: DC | PRN

## 2021-06-05 MED ORDER — ENOXAPARIN SODIUM 40 MG/0.4ML IJ SOSY
40.0000 mg | PREFILLED_SYRINGE | INTRAMUSCULAR | Status: DC
  Administered 2021-06-05 – 2021-06-08 (×4): 40 mg via SUBCUTANEOUS
  Filled 2021-06-05 (×4): qty 0.4

## 2021-06-05 MED ORDER — SODIUM CHLORIDE 0.9 % IV SOLN
100.0000 mg | Freq: Every day | INTRAVENOUS | Status: AC
  Administered 2021-06-06 – 2021-06-09 (×4): 100 mg via INTRAVENOUS
  Filled 2021-06-05 (×4): qty 20

## 2021-06-05 MED ORDER — GABAPENTIN 300 MG PO CAPS
900.0000 mg | ORAL_CAPSULE | Freq: Every day | ORAL | Status: DC
  Administered 2021-06-05 – 2021-06-09 (×5): 900 mg via ORAL
  Filled 2021-06-05 (×5): qty 3

## 2021-06-05 MED ORDER — ASPIRIN 81 MG PO CHEW
81.0000 mg | CHEWABLE_TABLET | Freq: Every day | ORAL | Status: DC
  Administered 2021-06-06 – 2021-06-10 (×5): 81 mg via ORAL
  Filled 2021-06-05 (×5): qty 1

## 2021-06-05 MED ORDER — ACETAMINOPHEN 650 MG RE SUPP: 650.0000 mg | Freq: Four times a day (QID) | RECTAL | Status: DC | PRN

## 2021-06-05 MED ORDER — ZINC SULFATE 220 (50 ZN) MG PO CAPS
220.0000 mg | ORAL_CAPSULE | Freq: Every day | ORAL | Status: DC
  Administered 2021-06-06 – 2021-06-10 (×5): 220 mg via ORAL
  Filled 2021-06-05 (×4): qty 1

## 2021-06-05 MED ORDER — VITAMIN D 25 MCG (1000 UNIT) PO TABS
5000.0000 [IU] | ORAL_TABLET | ORAL | Status: DC
  Administered 2021-06-06 – 2021-06-09 (×2): 5000 [IU] via ORAL
  Filled 2021-06-05 (×2): qty 5

## 2021-06-05 MED ORDER — MELATONIN 3 MG PO TABS: 3.0000 mg | ORAL_TABLET | Freq: Every evening | ORAL | Status: DC | PRN

## 2021-06-05 MED ORDER — GUAIFENESIN-DM 100-10 MG/5ML PO SYRP: 10.0000 mL | ORAL_SOLUTION | ORAL | Status: DC | PRN

## 2021-06-05 MED ORDER — FOLIC ACID 1 MG PO TABS
3.0000 mg | ORAL_TABLET | Freq: Every day | ORAL | Status: DC
  Administered 2021-06-06 – 2021-06-10 (×5): 3 mg via ORAL
  Filled 2021-06-05 (×5): qty 3

## 2021-06-05 MED ORDER — ACETAMINOPHEN 325 MG PO TABS
650.0000 mg | ORAL_TABLET | Freq: Four times a day (QID) | ORAL | Status: DC | PRN
  Administered 2021-06-06 – 2021-06-09 (×2): 650 mg via ORAL
  Filled 2021-06-05 (×2): qty 2

## 2021-06-05 NOTE — ED Triage Notes (Signed)
Pt presents from home via EMS for increased SOB. Per EMS, pt tested positive for Covid yesterday and has been symptomatic x5 days.Pt c/o generalized weakness for the past two days, with productive cough. Pt denies a hx of COPD or asthma. Pt is a former smoker and denies Covid vaccinations.

## 2021-06-05 NOTE — H&P (Signed)
History and Physical    [name] JME:[id number] DOB: [birth year] DOA: 06/05/2021  PCP: [name], MD  Patient coming from: Home.  Chief Complaint: Weakness.  HPI: [name] is a 75 y.o. female with history of rheumatoid arthritis on methotrexate and Plaquenil, hypertension, hypothyroidism presents to the ER because of weakness.  Patient states she has been feeling weak tired with sore throat and cough over the last 5 days.  Patient was tested positive at home with a home COVID test kit.  Since sore throat and had a weakness of the beginning worse patient decided come to the ER.  Denies any chest pain, nausea vomiting or diarrhea.  Has been having poor appetite.  ED Course: In the ER patient was afebrile and not hypoxic.  Chest x-ray does not show any infiltrates.  Labs are significant for sodium of 129 CRP of 8.3 D-dimer 1.94 COVID test was positive.  Patient's platelets is 102.  Patient was started on fluids.  Patient was sent on remdesivir.  Admitted for COVID-19 infection with dehydration.  Review of Systems: As per HPI, rest all negative.   Past Medical History:  Diagnosis Date   Anemia    Hx of    Arthritis    Calculus of bile duct without mention of cholecystitis or obstruction    Cataract    GERD (gastroesophageal reflux disease)    H. pylori infection    Hx of    Heart murmur    Hyperlipidemia    Hypertension    Hypothyroid    PUD (peptic ulcer disease)    Rheumatoid arthritis(714.0)     Past Surgical History:  Procedure Laterality Date   AV FISTULA REPAIR     BLADDER SUSPENSION     BREAST CYST EXCISION Left    BUNIONECTOMY     CARPAL TUNNEL RELEASE     CHOLECYSTECTOMY  09/07/2011   CYSTECTOMY     left breast   ERCP  09/29/2011   Procedure: ENDOSCOPIC RETROGRADE CHOLANGIOPANCREATOGRAPHY (ERCP);  Surgeon: Inda Castle, MD;  Location: [facility];  Service: Endoscopy;  Laterality: N/A;   PILONIDAL CYST EXCISION     TENOLYSIS   10/26/2011   Procedure: TENDON SHEATH RELEASE/TENOLYSIS;  Surgeon: Wynonia Sours, MD;  Location: [facility];  Service: Orthopedics;  Laterality: Right;  tenosynovectomy removal superficialis slip right index finger   TONSILLECTOMY     TOTAL SHOULDER REPLACEMENT Right 02/22/2014   Dr. Enrigue Catena at [facility]     ovaries not removed     reports that she quit smoking about 25 years ago. She has never used smokeless tobacco. She reports current alcohol use. She reports that she does not use drugs.  Allergies  Allergen Reactions   Ace Inhibitors Cough   Ambien [Zolpidem] Other (See Comments)    Odd Feeling   Crestor [Rosuvastatin]    Pravastatin    Prozac [Fluoxetine Hcl] Other (See Comments)    Decreased libido   Zoloft [Sertraline Hcl]     Family History  Problem Relation Age of Onset   Lung cancer Mother    Stomach cancer Father    Heart attack Father        MI at age 70   Colon cancer Maternal Grandfather    Heart attack Brother        MI at age 75   Prostate cancer Paternal Grandfather    Breast cancer Paternal Aunt  Prior to Admission medications   Medication Sig Start Date End Date Taking? Authorizing Provider  alendronate (FOSAMAX) 70 MG tablet Take 1 tablet once weekly with a full glass of water on an empty stomach for 30 minutes. Patient taking differently: Take 70 mg by mouth once a week. Take 1 tablet once weekly with a full glass of water on an empty stomach for 30 minutes. 07/08/20 07/08/21 Yes [name], MD  Ascorbic Acid (VITAMIN C) 1000 MG tablet Take 1,000 mg by mouth daily.   Yes [provider]  aspirin 81 MG tablet Take 81 mg by mouth daily.   Yes [provider]  Cholecalciferol (VITAMIN D PO) Take 5,000 Units by mouth See admin instructions. Takes 3 times a week   Yes [provider]  folic acid (FOLVITE) 1 MG tablet Take 3 mg by mouth daily.   Yes [provider]  gabapentin (NEURONTIN) 600 MG tablet TAKE 1/2 TO 1 TABLET 2 TO 3 TIMES DAILY AS NEEDED FOR PAIN Patient taking differently: Take 900 mg by mouth at bedtime. 12/07/20  Yes [name], MD  hydrochlorothiazide (HYDRODIURIL) 12.5 MG tablet Take  1 tablet  Daily  for BP & Fluid Retention Patient taking differently: Take 12.5 mg by mouth daily. 03/03/21  Yes [name], MD  hydroxychloroquine (PLAQUENIL) 200 MG tablet Take 200 mg by mouth 2 (two) times daily.   Yes [provider]  ibuprofen (IBU) 800 MG tablet TAKE 1 TABLET THREE TIMES DAILY WITH FOOD AS NEEDED FOR PAIN AND INFLAMMATION Patient taking differently: Take 800 mg by mouth every 8 (eight) hours as needed for moderate pain. 02/11/21  Yes Liane Comber, NP  levothyroxine (SYNTHROID) 137 MCG tablet TAKE 1 TAB DAILY ON AN EMPTY STOMACH WITH ONLY WATER X30 MIN, NO ANTACID MEDS, CALCIUM OR MAGNESIUM X4 HOURS. AVOID BIOTIN Patient taking differently: Take 137 mcg by mouth daily before breakfast. TAKE 1 TAB DAILY ON AN EMPTY STOMACH WITH ONLY WATER X30 MIN, NO ANTACID MEDS, CALCIUM OR MAGNESIUM X4 HOURS. AVOID BIOTIN 02/11/21  Yes Liane Comber, NP  Magnesium 500 MG TABS Take 500 tablets by mouth daily.   Yes [provider]  MELATONIN PO Take 1 tablet by mouth at bedtime.   Yes [provider]  methotrexate 50 MG/2ML injection Inject 25 mg into the skin once a week. Wednesdays 04/16/21  Yes [provider]  olmesartan (BENICAR) 20 MG tablet TAKE 1 TABLET EVERY DAY FOR BLOOD PRESSURE Patient taking differently: Take 20 mg by mouth daily. TAKE 1 TABLET EVERY DAY FOR BLOOD PRESSURE 02/26/21  Yes Liane Comber, NP  omeprazole (PRILOSEC) 40 MG capsule TAKE 1 CAPSULE TWICE DAILY FOR ACID REFLUX Patient taking differently: Take 40 mg by mouth daily as needed (heartburn). 11/26/18  Yes [name], MD  traZODone (DESYREL) 150 MG tablet Take  1 tablet  1 hour  before Bedtime  as needed for  Sleep Patient taking differently: Take 150 mg by mouth at bedtime. 03/03/21  Yes [name], MD  zinc gluconate 50 MG tablet Take 50 mg by mouth daily.   Yes [provider]  ALPRAZolam Duanne Moron) 1 MG tablet Take      1/2 to 1 tablet      at Bedtime     ONLY if needed for  Sleep and please try to limit to 5 days /week to avoid addiction Patient not taking: Reported on 06/05/2021 09/23/20   Garnet Sierras, NP  methotrexate (RHEUMATREX) 2.5 MG tablet Take  2.5 mg by mouth once a week. Patient injects 2.5 mg /ml 1 ml [place] weekly.    [provider]    Physical Exam: Constitutional: Moderately built and nourished. Vitals:   06/05/21 1515 06/05/21 1531 06/05/21 1545 06/05/21 1600  BP: 140/69 (!) 135/95 115/86 132/65  Pulse: 68 70 64 65  Resp: '16 18 17 16  ' Temp:      TempSrc:      SpO2: 96% 96% 97% 99%  Weight:      Height:       Eyes: Anicteric no pallor. ENMT: No discharge from the ears nose and mouth.  No obvious swelling in the throat. Neck: No neck mass.  No neck rigidity. Respiratory: No rhonchi or crepitations. Cardiovascular: S1-S2 heard. Abdomen: Soft nontender bowel sounds present. Musculoskeletal: No edema. Skin: No rash. Neurologic: Alert awake oriented to time place and person.  Moves all extremities. Psychiatric: Appears normal.  Normal affect.   Labs on Admission: I have personally reviewed following labs and imaging studies  CBC: Recent Labs  Lab 06/05/21 0953  WBC 10.5  NEUTROABS 9.7*  HGB 12.6  HCT 37.4  MCV 93.5  PLT 902*   Basic Metabolic Panel: Recent Labs  Lab 06/05/21 0953  NA 129*  K 4.2  CL 94*  CO2 28  GLUCOSE 103*  BUN 11  CREATININE 0.78  CALCIUM 9.6   GFR: Estimated Creatinine Clearance: 53.6 mL/min (by C-G formula based on SCr of 0.78 mg/dL). Liver Function Tests: Recent Labs  Lab 06/05/21 0953  AST 48*  ALT 24  ALKPHOS 82  BILITOT 0.9  PROT 6.2*  ALBUMIN 4.0   No results for input(s): LIPASE, AMYLASE  in the last 168 hours. No results for input(s): AMMONIA in the last 168 hours. Coagulation Profile: No results for input(s): INR, PROTIME in the last 168 hours. Cardiac Enzymes: No results for input(s): CKTOTAL, CKMB, CKMBINDEX, TROPONINI in the last 168 hours. BNP (last 3 results) No results for input(s): PROBNP in the last 8760 hours. HbA1C: No results for input(s): HGBA1C in the last 72 hours. CBG: No results for input(s): GLUCAP in the last 168 hours. Lipid Profile: Recent Labs    06/05/21 0953  TRIG 28   Thyroid Function Tests: No results for input(s): TSH, T4TOTAL, FREET4, T3FREE, THYROIDAB in the last 72 hours. Anemia Panel: Recent Labs    06/05/21 0953  FERRITIN 264   Urine analysis:    Component Value Date/Time   COLORURINE YELLOW 07/08/2020 0957   APPEARANCEUR CLEAR 07/08/2020 0957   LABSPEC 1.007 07/08/2020 0957   PHURINE 6.5 07/08/2020 0957   GLUCOSEU NEGATIVE 07/08/2020 0957   HGBUR NEGATIVE 07/08/2020 0957   BILIRUBINUR NEGATIVE 04/13/2017 1101   KETONESUR NEGATIVE 07/08/2020 0957   PROTEINUR NEGATIVE 07/08/2020 0957   UROBILINOGEN 0.2 06/28/2011 1225   NITRITE NEGATIVE 07/08/2020 0957   LEUKOCYTESUR TRACE (A) 07/08/2020 0957   Sepsis Labs: '@LABRCNTIP' (procalcitonin:4,lacticidven:4) ) Recent Results (from the past 240 hour(s))  Resp Panel by RT-PCR (Flu A&B, Covid) Nasopharyngeal Swab     Status: Abnormal   Collection Time: 06/05/21 10:14 AM   Specimen: Nasopharyngeal Swab; Nasopharyngeal(NP) swabs in vial transport medium  Result Value Ref Range Status   SARS Coronavirus 2 by RT PCR POSITIVE (A) NEGATIVE Final    Comment: RESULT CALLED TO, READ BACK BY AND VERIFIED WITH: BENNET L. ON 06/05/2021 @ 1200 BY MECIAL J.  (NOTE) SARS-CoV-2 target nucleic acids are DETECTED.  The SARS-CoV-2 RNA is generally detectable in upper respiratory specimens  during the acute phase of infection. Positive results are indicative of the presence of the identified  virus, but do not rule out bacterial infection or co-infection with other pathogens not detected by the test. Clinical correlation with patient history and other diagnostic information is necessary to determine patient infection status. The expected result is Negative.  Fact Sheet for Patients: EntrepreneurPulse.com.au  Fact Sheet for Healthcare Providers: IncredibleEmployment.be  This test is not yet approved or cleared by the [place] FDA and  has been authorized for detection and/or diagnosis of SARS-CoV-2 by FDA under an Emergency Use Authorization (EUA).  This EUA will remain in effect (meaning this  test can be used) for the duration of  the COVID-19 declaration under Section 564(b)(1) of the Act, 21 U.S.C. section 360bbb-3(b)(1), unless the authorization is terminated or revoked sooner.     Influenza A by PCR NEGATIVE NEGATIVE Final   Influenza B by PCR NEGATIVE NEGATIVE Final    Comment: (NOTE) The Xpert Xpress SARS-CoV-2/FLU/RSV plus assay is intended as an aid in the diagnosis of influenza from Nasopharyngeal swab specimens and should not be used as a sole basis for treatment. Nasal washings and aspirates are unacceptable for Xpert Xpress SARS-CoV-2/FLU/RSV testing.  Fact Sheet for Patients: EntrepreneurPulse.com.au  Fact Sheet for Healthcare Providers: IncredibleEmployment.be  This test is not yet approved or cleared by the [place] FDA and has been authorized for detection and/or diagnosis of SARS-CoV-2 by FDA under an Emergency Use Authorization (EUA). This EUA will remain in effect (meaning this test can be used) for the duration of the COVID-19 declaration under Section 564(b)(1) of the Act, 21 U.S.C. section 360bbb-3(b)(1), unless the authorization is terminated or revoked.  Performed at [facility], [place] [address] [place], [place] [address]       Radiological Exams on Admission: DG Chest Port 1 View  Result Date: 06/05/2021 CLINICAL DATA:  Shortness of breath EXAM: PORTABLE CHEST 1 VIEW COMPARISON:  Radiograph 11/21/2019 FINDINGS: Borderline enlarged cardiac silhouette.No focal airspace disease. No pleural effusion or pneumothorax.Anatomic right shoulder arthroplasty. 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 No acute osseous abnormality. IMPRESSION: No focal airspace disease. Electronically Signed   By: Maurine Simmering   On: 06/05/2021 11:32    EKG: Independently reviewed.  Normal sinus rhythm incomplete right bundle branch block.  Assessment/Plan Principal Problem:   COVID-19 Active Problems:   Essential hypertension   Rheumatoid arthritis (HCC)   Hyperlipidemia, mixed   Hypothyroidism   Hyponatremia   COVID-19 virus infection    COVID-19 infection with the dehydration and hyponatremia we will gently hydrate.  Likely from poor appetite probably caused by COVID-19 infection.  Hold hydrochlorothiazide due to hyponatremia.  Follow metabolic panel.  Closely follow respiratory status presently not hypoxic.  Follow inflammatory markers.  Patient is complaining of significant pain on on swallowing and sore throat.  Given immunosuppressed state was started on amoxicillin.  Sore throat could be likely caused by COVID-19.  If patient's throat pain does not improve then may consider further imaging. Hyponatremia likely from dehydration.  See 1. History of rheumatoid arthritis on Plaquenil and methotrexate. Hypothyroidism on Synthroid.  Check TSH. Hypertension on ARB.  Hold hydrochlorothiazide due to hyponatremia.  As needed IV hydralazine for systolic more than 509. Thrombocytopenia -appears to be new.  Could be from infectious cause.  Follow CBC closely.   DVT prophylaxis: Lovenox.  Noted patient has thrombocytopenia.  Presently count is 102. Code Status: Full code. Family Communication: Discussed with patient. Disposition  Plan: Home. Consults called: None. Admission status: Observation.   Rise Patience MD [facility] Pager [phone].  If 7PM-7AM, please contact night-coverage [email or web address] Password [facility]  06/05/2021, 4:23 PM

## 2021-06-05 NOTE — Progress Notes (Signed)
PT demonstrated hands on understanding of Flutter device. Dry, NP cough at this time.

## 2021-06-05 NOTE — ED Notes (Signed)
Hospitalist at the bedside 

## 2021-06-05 NOTE — ED Notes (Signed)
Provider at bedside

## 2021-06-05 NOTE — ED Notes (Signed)
Pt on cardiac monitoring.

## 2021-06-05 NOTE — ED Provider Notes (Signed)
[facility] DEPT Provider Note   CSN: [id number] Arrival date & time: 06/05/21  [id number]     History Chief Complaint  Patient presents with   Shortness of Breath    Covid positive     [name] is a 75 y.o. female.  She tested positive for COVID yesterday and was too weak to walk today.  She has been sick for about a week with generalized weakness cough productive of phlegm, sore throat,increased shortness of breath.  Denies any diarrhea.  EMS had initial sat of 91% on room air.  Low-grade temperature here.  She is a former smoker.  She is not COVID vaccinated.  Husband sick at home with similar symptoms, has not tested.  The history is provided by the patient and the EMS personnel.  Shortness of Breath Severity:  Moderate Onset quality:  Gradual Duration:  1 week Timing:  Intermittent Progression:  Unchanged Chronicity:  New Relieved by:  Nothing Worsened by:  Activity and coughing Ineffective treatments:  Rest Associated symptoms: cough, fever, sore throat and sputum production   Associated symptoms: no abdominal pain, no chest pain, no rash and no vomiting   Risk factors: no tobacco use       Past Medical History:  Diagnosis Date   Anemia    Hx of    Arthritis    Calculus of bile duct without mention of cholecystitis or obstruction    Cataract    GERD (gastroesophageal reflux disease)    H. pylori infection    Hx of    Heart murmur    Hyperlipidemia    Hypertension    Hypothyroid    PUD (peptic ulcer disease)    Rheumatoid arthritis(714.0)     Patient Active Problem List   Diagnosis Date Noted   Depression, major, single episode, in partial remission ([place]) 03/05/2020   Aortic atherosclerosis ([place]) by CXR in 2021 11/22/2019   Neuropathy 08/15/2018   Insomnia 02/06/2018   Overweight (BMI 25.0-29.9) 09/17/2015   Hyperparathyroidism ([place]) 07/25/2015   Hypothyroidism 02/11/2014   Osteoporosis 02/11/2014   Vitamin D  deficiency 01/22/2014   Abnormal glucose 01/22/2014   Medication management 01/22/2014   Personal history of colonic polyps 04/18/2013   Gastroesophageal reflux disease without esophagitis 02/28/2013   Hyperlipidemia, mixed 10/07/2011   Essential hypertension 10/21/2008   Rheumatoid arthritis ([place]) 10/21/2008    Past Surgical History:  Procedure Laterality Date   AV FISTULA REPAIR     BLADDER SUSPENSION     BREAST CYST EXCISION Left    BUNIONECTOMY     CARPAL TUNNEL RELEASE     CHOLECYSTECTOMY  09/07/2011   CYSTECTOMY     left breast   ERCP  09/29/2011   Procedure: ENDOSCOPIC RETROGRADE CHOLANGIOPANCREATOGRAPHY (ERCP);  Surgeon: Inda Castle, MD;  Location: [facility];  Service: Endoscopy;  Laterality: N/A;   PILONIDAL CYST EXCISION     TENOLYSIS  10/26/2011   Procedure: TENDON SHEATH RELEASE/TENOLYSIS;  Surgeon: Wynonia Sours, MD;  Location: [facility];  Service: Orthopedics;  Laterality: Right;  tenosynovectomy removal superficialis slip right index finger   TONSILLECTOMY     TOTAL SHOULDER REPLACEMENT Right 02/22/2014   Dr. Enrigue Catena at [facility]     ovaries not removed     OB History   No obstetric history on file.     Family History  Problem Relation Age of Onset   Lung cancer Mother  Stomach cancer Father    Heart attack Father        MI at age 58   Colon cancer Maternal Grandfather    Heart attack Brother        MI at age 8   Prostate cancer Paternal Grandfather    Breast cancer Paternal Aunt     Social History   Tobacco Use   Smoking status: Former    Types: Cigarettes    Quit date: 10/25/1995    Years since quitting: 25.6   Smokeless tobacco: Never   Tobacco comments:    Maryann Conners [id number]  Substance Use Topics   Alcohol use: Yes    Alcohol/week: 0.0 standard drinks    Comment: occasional   Drug use: No    Home Medications Prior to Admission medications   Medication Sig Start  Date End Date Taking? Authorizing Provider  alendronate (FOSAMAX) 70 MG tablet Take 1 tablet once weekly with a full glass of water on an empty stomach for 30 minutes. 07/08/20 07/08/21  [name], MD  ALPRAZolam Duanne Moron) 1 MG tablet Take      1/2 to 1 tablet      at Bedtime     ONLY if needed for  Sleep and please try to limit to 5 days /week to avoid addiction 09/23/20   Garnet Sierras, NP  Ascorbic Acid (VITAMIN C) 1000 MG tablet Take 1,000 mg by mouth daily.    [provider]  aspirin 81 MG tablet Take 81 mg by mouth daily.    [provider]  Cholecalciferol (VITAMIN D PO) Take 5,000 Units by mouth daily.    [provider]  folic acid (FOLVITE) 1 MG tablet Take 3 mg by mouth daily.    [provider]  gabapentin (NEURONTIN) 600 MG tablet TAKE 1/2 TO 1 TABLET 2 TO 3 TIMES DAILY AS NEEDED FOR PAIN 12/07/20   [name], MD  hydrochlorothiazide (HYDRODIURIL) 12.5 MG tablet Take  1 tablet  Daily  for BP & Fluid Retention 03/03/21   [name], MD  hydroxychloroquine (PLAQUENIL) 200 MG tablet Take by mouth daily. Take 1 tab AM and PM    [provider]  ibuprofen (IBU) 800 MG tablet TAKE 1 TABLET THREE TIMES DAILY WITH FOOD AS NEEDED FOR PAIN AND INFLAMMATION 02/11/21   Liane Comber, NP  levothyroxine (SYNTHROID) 137 MCG tablet TAKE 1 TAB DAILY ON AN EMPTY STOMACH WITH ONLY WATER X30 MIN, NO ANTACID MEDS, CALCIUM OR MAGNESIUM X4 HOURS. AVOID BIOTIN 02/11/21   Liane Comber, NP  Magnesium 500 MG TABS Take 1 tablet by mouth daily.    [provider]  MELATONIN PO Take 1 tablet by mouth at bedtime.    [provider]  methotrexate (RHEUMATREX) 2.5 MG tablet Take 2.5 mg by mouth once a week. Patient injects 2.5 mg /ml 1 ml [place] weekly.    [provider]  olmesartan (BENICAR) 20 MG tablet TAKE 1 TABLET EVERY DAY FOR BLOOD PRESSURE 02/26/21   Liane Comber, NP  omeprazole (PRILOSEC) 40 MG capsule TAKE 1 CAPSULE TWICE  DAILY FOR ACID REFLUX 11/26/18   [name], MD  traZODone (DESYREL) 150 MG tablet Take  1 tablet  1 hour  before Bedtime  as needed for Sleep 03/03/21   [name], MD  zinc gluconate 50 MG tablet Take 50 mg by mouth daily.    [provider]    Allergies    Ace inhibitors, Ambien [zolpidem], Crestor [rosuvastatin], Pravastatin, Prozac [  fluoxetine hcl], and Zoloft [sertraline hcl]  Review of Systems   Review of Systems  Constitutional:  Positive for appetite change, fatigue and fever.  HENT:  Positive for sore throat.   Eyes:  Negative for visual disturbance.  Respiratory:  Positive for cough, sputum production and shortness of breath.   Cardiovascular:  Negative for chest pain.  Gastrointestinal:  Negative for abdominal pain and vomiting.  Genitourinary:  Negative for dysuria.  Musculoskeletal:  Positive for myalgias.  Skin:  Negative for rash.  Neurological:  Negative for syncope.   Physical Exam Updated Vital Signs BP (!) 143/68 (BP Location: Left Arm)   Pulse 78   Temp 100.3 F (37.9 C) (Oral)   Ht '5\' 1"'$  (1.549 m)   Wt 65.8 kg   SpO2 94%   BMI 27.40 kg/m   Physical Exam Vitals and nursing note reviewed.  Constitutional:      General: She is not in acute distress.    Appearance: Normal appearance. She is well-developed.  HENT:     Head: Normocephalic and atraumatic.  Eyes:     Conjunctiva/sclera: Conjunctivae normal.  Cardiovascular:     Rate and Rhythm: Normal rate and regular rhythm.     Heart sounds: No murmur heard. Pulmonary:     Effort: Pulmonary effort is normal. No respiratory distress.     Breath sounds: Normal breath sounds.  Abdominal:     Palpations: Abdomen is soft.     Tenderness: There is no abdominal tenderness.  Musculoskeletal:        General: Normal range of motion.     Cervical back: Neck supple.     Right lower leg: No edema.     Left lower leg: No edema.  Skin:    General: Skin is warm and dry.     Capillary  Refill: Capillary refill takes less than 2 seconds.  Neurological:     General: No focal deficit present.     Mental Status: She is alert.    ED Results / Procedures / Treatments   Labs (all labs ordered are listed, but only abnormal results are displayed) Labs Reviewed  RESP PANEL BY RT-PCR (FLU A&B, COVID) ARPGX2 - Abnormal; Notable for the following components:      Result Value   SARS Coronavirus 2 by RT PCR POSITIVE (*)    All other components within normal limits  CBC WITH DIFFERENTIAL/PLATELET - Abnormal; Notable for the following components:   Platelets 102 (*)    Neutro Abs 9.7 (*)    Lymphs Abs 0.2 (*)    All other components within normal limits  COMPREHENSIVE METABOLIC PANEL - Abnormal; Notable for the following components:   Sodium 129 (*)    Chloride 94 (*)    Glucose, Bld 103 (*)    Total Protein 6.2 (*)    AST 48 (*)    All other components within normal limits  D-DIMER, QUANTITATIVE - Abnormal; Notable for the following components:   D-Dimer, Quant 0.94 (*)    All other components within normal limits  LACTATE DEHYDROGENASE - Abnormal; Notable for the following components:   LDH 315 (*)    All other components within normal limits  C-REACTIVE PROTEIN - Abnormal; Notable for the following components:   CRP 8.3 (*)    All other components within normal limits  CULTURE, BLOOD (ROUTINE X 2)  CULTURE, BLOOD (ROUTINE X 2)  LACTIC ACID, PLASMA  PROCALCITONIN  FERRITIN  TRIGLYCERIDES  FIBRINOGEN  CBC WITH DIFFERENTIAL/PLATELET  COMPREHENSIVE METABOLIC PANEL  C-REACTIVE PROTEIN  D-DIMER, QUANTITATIVE    EKG EKG Interpretation  Date/Time:  Friday June 05 2021 09:54:33 EDT Ventricular Rate:  77 PR Interval:  184 QRS Duration: 102 QT Interval:  332 QTC Calculation: 375 R Axis:   88 Text Interpretation: Normal sinus rhythm Incomplete right bundle branch block Nonspecific ST and T wave abnormality Abnormal ECG No old tracing to compare Confirmed by Aletta Edouard [phone]) on 06/05/2021 9:56:17 AM  Radiology DG Chest Port 1 View  Result Date: 06/05/2021 CLINICAL DATA:  Shortness of breath EXAM: PORTABLE CHEST 1 VIEW COMPARISON:  Radiograph 11/21/2019 FINDINGS: Borderline enlarged cardiac silhouette.No focal airspace disease. No pleural effusion or pneumothorax.Anatomic right shoulder arthroplasty. 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 No acute osseous abnormality. IMPRESSION: No focal airspace disease. Electronically Signed   By: Maurine Simmering   On: 06/05/2021 11:32    Procedures Procedures   Medications Ordered in ED Medications  0.9 %  sodium chloride infusion (has no administration in time range)    ED Course  I have reviewed the triage vital signs and the nursing notes.  Pertinent labs & imaging results that were available during my care of the patient were reviewed by me and considered in my medical decision making (see chart for details).  Clinical Course as of 06/05/21 1802  Fri Jun 05, 2021  1105 Chest x-ray interpreted by me as no definitive infiltrate.  Awaiting radiology reading [MB]    Clinical Course User Index [MB] Hayden Rasmussen, MD   MDM Rules/Calculators/A&P                          [name] was evaluated in Emergency Department on 06/05/2021 for the symptoms described in the history of present illness. She was evaluated in the context of the global COVID-19 pandemic, which necessitated consideration that the patient might be at risk for infection with the SARS-CoV-2 virus that causes COVID-19. Institutional protocols and algorithms that pertain to the evaluation of patients at risk for COVID-19 are in a state of rapid change based on information released by regulatory bodies including the CDC and federal and state organizations. These policies and algorithms were followed during the patient's care in the ED.  This patient complains of elevated generalized weakness fatigue poor appetite sore  throat shortness of breath; this involves an extensive number of treatment Options and is a complaint that carries with it a high risk of complications and Morbidity. The differential includes COVID, dehydration, pneumonia, strep throat metabolic derangement hypoxia  I ordered, reviewed and interpreted labs, which included CBC with normal white count normal hemoglobin, platelets low [place] secondary to viral, low sodium and chloride possibly reflecting some dehydration, inflammatory markers mildly elevated, COVID test positive I ordered medication IV fluids IV remdesivir I ordered imaging studies which included chest x-ray and I independently    visualized and interpreted imaging which showed no acute findings Previous records obtained and reviewed in epic no recent admissions I consulted [facility] hospitalist Dr. Hal Hope and discussed lab and imaging findings  Critical Interventions: None  After the interventions stated above, I reevaluated the patient and found patient's vital signs to be stable.  She is not hypoxic.  She still is very weak and frail appearing.  Discussed admission to the hospital versus home treatment and she elects for being admitted to the hospital.   Final Clinical Impression(s) / ED Diagnoses Final diagnoses:  COVID-19 virus infection  Acute pharyngitis, unspecified etiology  General weakness  Hyponatremia    Rx / DC Orders ED Discharge Orders     None        Hayden Rasmussen, MD 06/05/21 1806

## 2021-06-05 NOTE — ED Notes (Signed)
Pt agreeable to receiving Remdesevir at this time. Pt verifies that provider has discussed this treatment with her as well as risks and benefits.

## 2021-06-05 NOTE — ED Notes (Signed)
Pt ambulated by ED tech with minimal standby assist. Per ED tech, pt steady on her feet. O2 sats on room air remained 94% during ambulation. Dr. Melina Copa, ED provider notified and aware. ED provider back at the bedside at this time.

## 2021-06-06 DIAGNOSIS — Z87891 Personal history of nicotine dependence: Secondary | ICD-10-CM | POA: Diagnosis not present

## 2021-06-06 DIAGNOSIS — K219 Gastro-esophageal reflux disease without esophagitis: Secondary | ICD-10-CM | POA: Diagnosis present

## 2021-06-06 DIAGNOSIS — R531 Weakness: Secondary | ICD-10-CM | POA: Diagnosis present

## 2021-06-06 DIAGNOSIS — Z888 Allergy status to other drugs, medicaments and biological substances status: Secondary | ICD-10-CM | POA: Diagnosis not present

## 2021-06-06 DIAGNOSIS — R7881 Bacteremia: Secondary | ICD-10-CM | POA: Diagnosis present

## 2021-06-06 DIAGNOSIS — Z79890 Hormone replacement therapy (postmenopausal): Secondary | ICD-10-CM | POA: Diagnosis not present

## 2021-06-06 DIAGNOSIS — B957 Other staphylococcus as the cause of diseases classified elsewhere: Secondary | ICD-10-CM | POA: Diagnosis present

## 2021-06-06 DIAGNOSIS — Z8249 Family history of ischemic heart disease and other diseases of the circulatory system: Secondary | ICD-10-CM | POA: Diagnosis not present

## 2021-06-06 DIAGNOSIS — U071 COVID-19: Secondary | ICD-10-CM | POA: Diagnosis present

## 2021-06-06 DIAGNOSIS — Z79899 Other long term (current) drug therapy: Secondary | ICD-10-CM | POA: Diagnosis not present

## 2021-06-06 DIAGNOSIS — E039 Hypothyroidism, unspecified: Secondary | ICD-10-CM

## 2021-06-06 DIAGNOSIS — D696 Thrombocytopenia, unspecified: Secondary | ICD-10-CM | POA: Diagnosis present

## 2021-06-06 DIAGNOSIS — Z96611 Presence of right artificial shoulder joint: Secondary | ICD-10-CM | POA: Diagnosis present

## 2021-06-06 DIAGNOSIS — E871 Hypo-osmolality and hyponatremia: Secondary | ICD-10-CM | POA: Diagnosis present

## 2021-06-06 DIAGNOSIS — E782 Mixed hyperlipidemia: Secondary | ICD-10-CM | POA: Diagnosis present

## 2021-06-06 DIAGNOSIS — D849 Immunodeficiency, unspecified: Secondary | ICD-10-CM | POA: Diagnosis present

## 2021-06-06 DIAGNOSIS — E860 Dehydration: Secondary | ICD-10-CM | POA: Diagnosis present

## 2021-06-06 DIAGNOSIS — Z8711 Personal history of peptic ulcer disease: Secondary | ICD-10-CM | POA: Diagnosis not present

## 2021-06-06 DIAGNOSIS — I10 Essential (primary) hypertension: Secondary | ICD-10-CM | POA: Diagnosis present

## 2021-06-06 DIAGNOSIS — Z28310 Unvaccinated for covid-19: Secondary | ICD-10-CM | POA: Diagnosis not present

## 2021-06-06 DIAGNOSIS — M069 Rheumatoid arthritis, unspecified: Secondary | ICD-10-CM | POA: Diagnosis present

## 2021-06-06 LAB — CBC WITH DIFFERENTIAL/PLATELET
Abs Immature Granulocytes: 0.04 10*3/uL (ref 0.00–0.07)
Basophils Absolute: 0 10*3/uL (ref 0.0–0.1)
Basophils Relative: 0 %
Eosinophils Absolute: 0 10*3/uL (ref 0.0–0.5)
Eosinophils Relative: 0 %
HCT: 35.3 % — ABNORMAL LOW (ref 36.0–46.0)
Hemoglobin: 11.9 g/dL — ABNORMAL LOW (ref 12.0–15.0)
Immature Granulocytes: 1 %
Lymphocytes Relative: 6 %
Lymphs Abs: 0.4 10*3/uL — ABNORMAL LOW (ref 0.7–4.0)
MCH: 31.4 pg (ref 26.0–34.0)
MCHC: 33.7 g/dL (ref 30.0–36.0)
MCV: 93.1 fL (ref 80.0–100.0)
Monocytes Absolute: 0.7 10*3/uL (ref 0.1–1.0)
Monocytes Relative: 8 %
Neutro Abs: 6.7 10*3/uL (ref 1.7–7.7)
Neutrophils Relative %: 85 %
Platelets: 94 10*3/uL — ABNORMAL LOW (ref 150–400)
RBC: 3.79 MIL/uL — ABNORMAL LOW (ref 3.87–5.11)
RDW: 14.4 % (ref 11.5–15.5)
WBC: 7.8 10*3/uL (ref 4.0–10.5)
nRBC: 0 % (ref 0.0–0.2)

## 2021-06-06 LAB — BLOOD CULTURE ID PANEL (REFLEXED) - BCID2

## 2021-06-06 LAB — COMPREHENSIVE METABOLIC PANEL
ALT: 23 U/L (ref 0–44)
AST: 47 U/L — ABNORMAL HIGH (ref 15–41)
Albumin: 3.4 g/dL — ABNORMAL LOW (ref 3.5–5.0)
Alkaline Phosphatase: 75 U/L (ref 38–126)
Anion gap: 7 (ref 5–15)
BUN: 11 mg/dL (ref 8–23)
CO2: 28 mmol/L (ref 22–32)
Calcium: 9.2 mg/dL (ref 8.9–10.3)
Chloride: 98 mmol/L (ref 98–111)
Creatinine, Ser: 0.7 mg/dL (ref 0.44–1.00)
GFR, Estimated: >60 mL/min (ref >60)
Glucose, Bld: 93 mg/dL (ref 70–99)
Potassium: 3.8 mmol/L (ref 3.5–5.1)
Sodium: 133 mmol/L — ABNORMAL LOW (ref 135–145)
Total Bilirubin: 0.8 mg/dL (ref 0.3–1.2)
Total Protein: 5.6 g/dL — ABNORMAL LOW (ref 6.5–8.1)

## 2021-06-06 LAB — C-REACTIVE PROTEIN: CRP: 15.1 mg/dL — ABNORMAL HIGH (ref <1.0)

## 2021-06-06 LAB — D-DIMER, QUANTITATIVE: D-Dimer, Quant: 0.84 ug/mL-FEU — ABNORMAL HIGH (ref 0.00–0.50)

## 2021-06-06 MED ORDER — VANCOMYCIN HCL IN DEXTROSE 1-5 GM/200ML-% IV SOLN
1000.0000 mg | Freq: Once | INTRAVENOUS | Status: AC
  Administered 2021-06-06: 1000 mg via INTRAVENOUS
  Filled 2021-06-06: qty 200

## 2021-06-06 MED ORDER — MENTHOL 3 MG MT LOZG
1.0000 | LOZENGE | OROMUCOSAL | Status: DC | PRN
  Filled 2021-06-06: qty 9

## 2021-06-06 MED ORDER — GUAIFENESIN ER 600 MG PO TB12
1200.0000 mg | ORAL_TABLET | Freq: Two times a day (BID) | ORAL | Status: DC
  Administered 2021-06-06 – 2021-06-10 (×9): 1200 mg via ORAL
  Filled 2021-06-06 (×9): qty 2

## 2021-06-06 MED ORDER — METHYLPREDNISOLONE SODIUM SUCC 125 MG IJ SOLR
125.0000 mg | Freq: Every day | INTRAMUSCULAR | Status: DC
  Administered 2021-06-06: 125 mg via INTRAVENOUS
  Filled 2021-06-06: qty 2

## 2021-06-06 MED ORDER — VANCOMYCIN HCL 1250 MG/250ML IV SOLN
1250.0000 mg | INTRAVENOUS | Status: DC
  Administered 2021-06-07 – 2021-06-09 (×2): 1250 mg via INTRAVENOUS
  Filled 2021-06-06 (×2): qty 250

## 2021-06-06 MED ORDER — METHYLPREDNISOLONE SODIUM SUCC 40 MG IJ SOLR
40.0000 mg | Freq: Two times a day (BID) | INTRAMUSCULAR | Status: DC
  Administered 2021-06-07 – 2021-06-08 (×3): 40 mg via INTRAVENOUS
  Filled 2021-06-06 (×3): qty 1

## 2021-06-06 NOTE — Progress Notes (Addendum)
[facility]  PROGRESS NOTE  CACI WANSER [id number] DOB: [birth year] DOA: 06/05/2021 PCP: [name], MD   Brief HPI:   75 year old female with history of rheumatoid arthritis on methotrexate and Plaquenil , hypertension, hypothyroidism came to ED with complaints of weakness.  Patient has been feeling weak, tired with sore throat and cough for past 5 days.  She was tested home with positive COVID-19.  In the ED CRP was 8.3, D-dimer 1.94.    Subjective   Patient seen and examined, complains of coughing up phlegm.  Denies shortness of breath.  Blood cultures grew 2 out of 4 bottles positive for MRSE.   Assessment/Plan:     COVID-19 infection -CRP has increased from 8.3-15.1 today; now requiring oxygen 2 L/min -Patient started on remdesivir per pharmacy consultation -We will also start Solu-Medrol 40 mg IV every 12 hours -We will order flutter valve, chest PT, Mucinex;  discontinue IV fluids -Follow inflammatory markers in a.m.  MRSE bacteremia -2/4 bottles positive for MRSE -We will start vancomycin per pharmacy consultation   Hyponatremia -Sodium 129 yesterday, improved to 133 today -We will discontinue IV fluids  Hypothyroidism -Continue Synthroid  Hypertension -Blood pressure is mildly elevated -Continue irbesartan; hydrochlorothiazide on hold due to hyponatremia -Continue as needed IV hydralazine  History of rheumatoid arthritis -Continue Plaquenil, methotrexate  Thrombocytopenia -Platelet count 94,000 today -We will closely follow platelet count as patient is on Lovenox  H/o rheumatoid arthritis Plaquenil on hold per pharmacy, as patient is on remdesevir    Scheduled medications:    amoxicillin  500 mg Oral Q8H   vitamin C  1,000 mg Oral Daily   aspirin  81 mg Oral Daily   cholecalciferol  5,000 Units Oral Once per day on Tue Thu Sat   enoxaparin (LOVENOX) injection  40 mg Subcutaneous [id number]   folic acid  3 mg Oral Daily   gabapentin   900 mg Oral QHS   guaiFENesin  1,200 mg Oral BID   irbesartan  150 mg Oral Daily   levothyroxine  137 mcg Oral QAC breakfast   methylPREDNISolone (SOLU-MEDROL) injection  125 mg Intravenous Daily   traZODone  150 mg Oral QHS   zinc sulfate  220 mg Oral Daily         Data Reviewed:   CBG:  No results for input(s): GLUCAP in the last 168 hours.  SpO2: 93 % O2 Flow Rate (L/min): 2 L/min    Vitals:   06/05/21 2101 06/06/21 0107 06/06/21 0507 06/06/21 0533  BP: 137/61 (!) 140/102 (!) 148/68   Pulse: 71 77 86   Resp: 20 14 (!) 24   Temp: 100.1 F (37.8 C) (!) 100.8 F (38.2 C) 100.3 F (37.9 C)   TempSrc: Oral Axillary Axillary   SpO2: 100% 100% (!) 88% 93%  Weight:      Height:         Intake/Output Summary (Last 24 hours) at 06/06/2021 1414 Last data filed at 06/06/2021 0442 Gross per 24 hour  Intake 1568.47 ml  Output --  Net 1568.47 ml    07/28 1901 - 07/30 0700 In: 1568.5 [I.V.:1568.5] Out: -   Filed Weights   06/05/21 0939  Weight: 65.8 kg    CBC:  Recent Labs  Lab 06/05/21 0953 06/06/21 0418  WBC 10.5 7.8  HGB 12.6 11.9*  HCT 37.4 35.3*  PLT 102* 94*  MCV 93.5 93.1  MCH 31.5 31.4  MCHC 33.7 33.7  RDW 14.3 14.4  LYMPHSABS 0.2* 0.4*  MONOABS 0.6 0.7  EOSABS 0.0 0.0  BASOSABS 0.0 0.0    Complete metabolic panel:  Recent Labs  Lab 06/05/21 0953 06/05/21 1009 06/06/21 0418  NA 129*  --  133*  K 4.2  --  3.8  CL 94*  --  98  CO2 28  --  28  GLUCOSE 103*  --  93  BUN 11  --  11  CREATININE 0.78  --  0.70  CALCIUM 9.6  --  9.2  AST 48*  --  47*  ALT 24  --  23  ALKPHOS 82  --  75  BILITOT 0.9  --  0.8  ALBUMIN 4.0  --  3.4*  CRP 8.3*  --  15.1*  DDIMER 0.94*  --  0.84*  PROCALCITON <0.10  --   --   LATICACIDVEN  --  0.7  --     No results for input(s): LIPASE, AMYLASE in the last 168 hours.  Recent Labs  Lab 06/05/21 0953 06/05/21 1014 06/06/21 0418  CRP 8.3*  --  15.1*  DDIMER 0.94*  --  0.84*  PROCALCITON <0.10   --   --   SARSCOV2NAA  --  POSITIVE*  --     ------------------------------------------------------------------------------------------------------------------ Recent Labs    06/05/21 0953  TRIG 28    Lab Results  Component Value Date   HGBA1C 5.1 01/21/2021   ------------------------------------------------------------------------------------------------------------------ No results for input(s): TSH, T4TOTAL, T3FREE, THYROIDAB in the last 72 hours.  Invalid input(s): FREET3 ------------------------------------------------------------------------------------------------------------------ Recent Labs    06/05/21 0953  FERRITIN 264    Coagulation profile No results for input(s): INR, PROTIME in the last 168 hours. Recent Labs    06/05/21 0953 06/06/21 0418  DDIMER 0.94* 0.84*    Cardiac Enzymes No results for input(s): CKTOTAL, CKMB, CKMBINDEX, TROPONINI in the last 168 hours.  ------------------------------------------------------------------------------------------------------------------ No results found for: BNP   Antibiotics: Anti-infectives (From admission, onward)    Start     Dose/Rate Route Frequency Ordered Stop   06/07/21 2200  vancomycin (VANCOREADY) IVPB 1250 mg/250 mL        1,250 mg 166.7 mL/hr over 90 Minutes Intravenous Every 36 hours 06/06/21 1034     06/06/21 1030  vancomycin (VANCOCIN) IVPB 1000 mg/200 mL premix        1,000 mg 200 mL/hr over 60 Minutes Intravenous  Once 06/06/21 1015     06/06/21 1000  remdesivir 100 mg in sodium chloride 0.9 % 100 mL IVPB       See Hyperspace for full Linked Orders Report.   100 mg 200 mL/hr over 30 Minutes Intravenous Daily 06/05/21 1211 06/10/21 0959   06/05/21 2200  hydroxychloroquine (PLAQUENIL) tablet 200 mg  Status:  Discontinued        200 mg Oral 2 times daily 06/05/21 1622 06/05/21 1850   06/05/21 2000  amoxicillin (AMOXIL) capsule 500 mg        500 mg Oral Every 8 hours 06/05/21 1624      06/05/21 1330  remdesivir 200 mg in sodium chloride 0.9% 250 mL IVPB       See Hyperspace for full Linked Orders Report.   200 mg 580 mL/hr over 30 Minutes Intravenous Once 06/05/21 1211 06/05/21 1851        Radiology Reports  DG Chest Port 1 View  Result Date: 06/05/2021 CLINICAL DATA:  Shortness of breath EXAM: PORTABLE CHEST 1 VIEW COMPARISON:  Radiograph 11/21/2019 FINDINGS: Borderline enlarged cardiac silhouette.No focal airspace disease. No pleural effusion or  pneumothorax.Anatomic right shoulder arthroplasty. 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 No acute osseous abnormality. IMPRESSION: No focal airspace disease. Electronically Signed   By: Maurine Simmering   On: 06/05/2021 11:32      DVT prophylaxis: Lovenox  Code Status: Full code  Family Communication: No family at bedside   Consultants:   Procedures:     Objective    Physical Examination:   General-appears in no acute distress Heart-S1-S2, regular, no murmur auscultated Lungs-bilateral rhonchi auscultated Abdomen-soft, nontender, no organomegaly Extremities-no edema in the lower extremities Neuro-alert, oriented x3, no focal deficit noted   Status is: Inpatient  Dispo: The patient is from: Home              Anticipated d/c is to: Home              Anticipated d/c date is: 06/10/2021              Patient currently not stable for discharge  Barrier to discharge-COVID-19 infection  COVID-19 Labs  Recent Labs    06/05/21 0953 06/06/21 0418  DDIMER 0.94* 0.84*  FERRITIN 264  --   LDH 315*  --   CRP 8.3* 15.1*    Lab Results  Component Value Date   SARSCOV2NAA POSITIVE (A) 06/05/2021    Microbiology  Recent Results (from the past 240 hour(s))  Blood Culture (routine x 2)     Status: None (Preliminary result)   Collection Time: 06/05/21  9:48 AM   Specimen: BLOOD  Result Value Ref Range Status   Specimen Description   Final    BLOOD RIGHT ANTECUBITAL Performed at [facility], [place] [address] [place], [place] [address]    Special Requests   Final    BOTTLES DRAWN AEROBIC AND ANAEROBIC Blood Culture results may not be optimal due to an inadequate volume of blood received in culture bottles Performed at [place] [address] [place], [place] [address]    Culture  Setup Time   Final    GRAM POSITIVE COCCI IN CLUSTERS ANAEROBIC BOTTLE ONLY CRITICAL VALUE NOTED.  VALUE IS CONSISTENT WITH PREVIOUSLY REPORTED AND CALLED VALUE. Performed at [facility], [place] [address] [place], [place] [address]    Culture GRAM POSITIVE COCCI  Final   Report Status PENDING  Incomplete  Blood Culture (routine x 2)     Status: None (Preliminary result)   Collection Time: 06/05/21 10:02 AM   Specimen: BLOOD  Result Value Ref Range Status   Specimen Description   Final    BLOOD LEFT ANTECUBITAL Performed at [place] [address], [place], [place] [address]    Special Requests   Final    BOTTLES DRAWN AEROBIC AND ANAEROBIC Blood Culture results may not be optimal due to an inadequate volume of blood received in culture bottles Performed at [place] [address] [place], [place] [address]    Culture  Setup Time   Final    IN BOTH AEROBIC AND ANAEROBIC BOTTLES GRAM POSITIVE COCCI IN CLUSTERS CRITICAL RESULT CALLED TO, READ BACK BY AND VERIFIED WITH: PHARM D C.SHADE ON [id number] AT 0902 BY E.PARRISH Performed at [facility], [place] [address], [place],  [address]    Culture [facility] POSITIVE COCCI  Final   Report Status PENDING  Incomplete  Blood Culture ID Panel (Reflexed)     Status: Abnormal   Collection Time: 06/05/21 10:02 AM  Result Value  Ref Range Status   Enterococcus faecalis NOT DETECTED NOT DETECTED Final   Enterococcus Faecium NOT DETECTED NOT DETECTED Final   Listeria monocytogenes NOT DETECTED NOT DETECTED Final   Staphylococcus species DETECTED (A)  NOT DETECTED Final    Comment: CRITICAL RESULT CALLED TO, READ BACK BY AND VERIFIED WITH: PHARM D C.SHADE ON [id number] AT 0902 BY E.PARRISH    Staphylococcus aureus (BCID) NOT DETECTED NOT DETECTED Final   Staphylococcus epidermidis DETECTED (A) NOT DETECTED Final    Comment: Methicillin (oxacillin) resistant coagulase negative staphylococcus. Possible blood culture contaminant (unless isolated from more than one blood culture draw or clinical case suggests pathogenicity). No antibiotic treatment is indicated for blood  culture contaminants. CRITICAL RESULT CALLED TO, READ BACK BY AND VERIFIED WITH: PHARM D C.SHADE ON [id number] AT 0902 BY E.PARRISH    Staphylococcus lugdunensis NOT DETECTED NOT DETECTED Final   Streptococcus species NOT DETECTED NOT DETECTED Final   Streptococcus agalactiae NOT DETECTED NOT DETECTED Final   Streptococcus pneumoniae NOT DETECTED NOT DETECTED Final   Streptococcus pyogenes NOT DETECTED NOT DETECTED Final   A.calcoaceticus-baumannii NOT DETECTED NOT DETECTED Final   Bacteroides fragilis NOT DETECTED NOT DETECTED Final   Enterobacterales NOT DETECTED NOT DETECTED Final   Enterobacter cloacae complex NOT DETECTED NOT DETECTED Final   Escherichia coli NOT DETECTED NOT DETECTED Final   Klebsiella aerogenes NOT DETECTED NOT DETECTED Final   Klebsiella oxytoca NOT DETECTED NOT DETECTED Final   Klebsiella pneumoniae NOT DETECTED NOT DETECTED Final   Proteus species NOT DETECTED NOT DETECTED Final   Salmonella species NOT DETECTED NOT DETECTED Final   Serratia marcescens NOT DETECTED NOT DETECTED Final   Haemophilus influenzae NOT DETECTED NOT DETECTED Final   Neisseria meningitidis NOT DETECTED NOT DETECTED Final   Pseudomonas aeruginosa NOT DETECTED NOT DETECTED Final   Stenotrophomonas maltophilia NOT DETECTED NOT DETECTED Final   Candida albicans NOT DETECTED NOT DETECTED Final   Candida auris NOT DETECTED NOT DETECTED Final   Candida glabrata NOT DETECTED  NOT DETECTED Final   Candida krusei NOT DETECTED NOT DETECTED Final   Candida parapsilosis NOT DETECTED NOT DETECTED Final   Candida tropicalis NOT DETECTED NOT DETECTED Final   Cryptococcus neoformans/gattii NOT DETECTED NOT DETECTED Final   Methicillin resistance mecA/C DETECTED (A) NOT DETECTED Final    Comment: CRITICAL RESULT CALLED TO, READ BACK BY AND VERIFIED WITH: PHARM D C.SHADE ON [id number] AT 0902 BY E.PARRISH Performed at [facility], [place] [address], [place], [place] [address]   Resp Panel by RT-PCR (Flu A&B, Covid) Nasopharyngeal Swab     Status: Abnormal   Collection Time: 06/05/21 10:14 AM   Specimen: Nasopharyngeal Swab; Nasopharyngeal(NP) swabs in vial transport medium  Result Value Ref Range Status   SARS Coronavirus 2 by RT PCR POSITIVE (A) NEGATIVE Final    Comment: RESULT CALLED TO, READ BACK BY AND VERIFIED WITH: BENNET L. ON 06/05/2021 @ 1200 BY MECIAL J.  (NOTE) SARS-CoV-2 target nucleic acids are DETECTED.  The SARS-CoV-2 RNA is generally detectable in upper respiratory specimens during the acute phase of infection. Positive results are indicative of the presence of the identified virus, but do not rule out bacterial infection or co-infection with other pathogens not detected by the test. Clinical correlation with patient history and other diagnostic information is necessary to determine patient infection status. The expected result is Negative.  Fact Sheet for Patients: EntrepreneurPulse.com.au  Fact Sheet for Healthcare Providers: IncredibleEmployment.be  This test is not yet approved or  cleared by the [place] and  has been authorized for detection and/or diagnosis of SARS-CoV-2 by FDA under an Emergency Use Authorization (EUA).  This EUA will remain in effect (meaning this  test can be used) for the duration of  the COVID-19 declaration under Section 564(b)(1) of the Act, 21 U.S.C. section  360bbb-3(b)(1), unless the authorization is terminated or revoked sooner.     Influenza A by PCR NEGATIVE NEGATIVE Final   Influenza B by PCR NEGATIVE NEGATIVE Final    Comment: (NOTE) The Xpert Xpress SARS-CoV-2/FLU/RSV plus assay is intended as an aid in the diagnosis of influenza from Nasopharyngeal swab specimens and should not be used as a sole basis for treatment. Nasal washings and aspirates are unacceptable for Xpert Xpress SARS-CoV-2/FLU/RSV testing.  Fact Sheet for Patients: EntrepreneurPulse.com.au  Fact Sheet for Healthcare Providers: IncredibleEmployment.be  This test is not yet approved or cleared by the [place] FDA and has been authorized for detection and/or diagnosis of SARS-CoV-2 by FDA under an Emergency Use Authorization (EUA). This EUA will remain in effect (meaning this test can be used) for the duration of the COVID-19 declaration under Section 564(b)(1) of the Act, 21 U.S.C. section 360bbb-3(b)(1), unless the authorization is terminated or revoked.  Performed at [facility], [place] [address] [place], [place] [address]          Oswald Hillock   [facility] If 7PM-7AM, please contact night-coverage at [email or web address], Office  [phone]   06/06/2021, 2:14 PM  LOS: 0 days

## 2021-06-06 NOTE — Progress Notes (Signed)
Pharmacy Antibiotic Note  [name] is a 75 y.o. female admitted on 06/05/2021 with Covid, found to have positive blood cultures.  Pharmacy has been consulted for Vancomycin dosing.  Plan: Vancomycin 1g IV then 1250 mg IV q36h (SCr rounded to 1, est AUC 498)  Measure Vanc peak and trough at steady state.  Goal AUC = 400 - 550. Follow up renal function, culture results, and clinical course.   Height: '5\' 1"'$  (154.9 cm) Weight: 65.8 kg (145 lb) IBW/kg (Calculated) : 47.8  Temp (24hrs), Avg:99.9 F (37.7 C), Min:98.8 F (37.1 C), Max:100.8 F (38.2 C)  Recent Labs  Lab 06/05/21 0953 06/05/21 1009 06/06/21 0418  WBC 10.5  --  7.8  CREATININE 0.78  --  0.70  LATICACIDVEN  --  0.7  --     Estimated Creatinine Clearance: 53.6 mL/min (by C-G formula based on SCr of 0.7 mg/dL).    Allergies  Allergen Reactions   Ace Inhibitors Cough   Ambien [Zolpidem] Other (See Comments)    Odd Feeling   Crestor [Rosuvastatin]    Pravastatin    Prozac [Fluoxetine Hcl] Other (See Comments)    Decreased libido   Zoloft [Sertraline Hcl]      Thank you for allowing pharmacy to be a part of this patient's care.  Gretta Arab PharmD, BCPS Clinical Pharmacist [facility] main pharmacy [phone] 06/06/2021 10:33 AM

## 2021-06-06 NOTE — Progress Notes (Signed)
PHARMACY - PHYSICIAN COMMUNICATION CRITICAL VALUE ALERT - BLOOD CULTURE IDENTIFICATION (BCID)  [name] is an 75 y.o. female who presented to [facility] on [facility]2021 with a chief complaint of weakness, sore throat, cough, COVID positive at home.  PMH of RA (takes MTX and plaquenil), HTN, hypothyroidism.    Assessment: 2/4 bottles (1 in each set) positive for methicillin resistant staph epidermidis.  PCT < 0.1 (7/29), Tm 100.8, WBC 7.8  Name of physician (or Provider) Contacted: Dr. Darrick Meigs  Current antimicrobials: Remdesivir, amoxicillin  Changes to prescribed antibiotics recommended:  No changes at this time.  Dr. Darrick Meigs will see patient and review labs/orders.    Results for orders placed or performed during the hospital encounter of 06/05/21  Blood Culture ID Panel (Reflexed) (Collected: 06/05/2021 10:02 AM)  Result Value Ref Range   Enterococcus faecalis NOT DETECTED NOT DETECTED   Enterococcus Faecium NOT DETECTED NOT DETECTED   Listeria monocytogenes NOT DETECTED NOT DETECTED   Staphylococcus species DETECTED (A) NOT DETECTED   Staphylococcus aureus (BCID) NOT DETECTED NOT DETECTED   Staphylococcus epidermidis DETECTED (A) NOT DETECTED   Staphylococcus lugdunensis NOT DETECTED NOT DETECTED   Streptococcus species NOT DETECTED NOT DETECTED   Streptococcus agalactiae NOT DETECTED NOT DETECTED   Streptococcus pneumoniae NOT DETECTED NOT DETECTED   Streptococcus pyogenes NOT DETECTED NOT DETECTED   A.calcoaceticus-baumannii NOT DETECTED NOT DETECTED   Bacteroides fragilis NOT DETECTED NOT DETECTED   Enterobacterales NOT DETECTED NOT DETECTED   Enterobacter cloacae complex NOT DETECTED NOT DETECTED   Escherichia coli NOT DETECTED NOT DETECTED   Klebsiella aerogenes NOT DETECTED NOT DETECTED   Klebsiella oxytoca NOT DETECTED NOT DETECTED   Klebsiella pneumoniae NOT DETECTED NOT DETECTED   Proteus species NOT DETECTED NOT DETECTED   Salmonella species NOT DETECTED NOT  DETECTED   Serratia marcescens NOT DETECTED NOT DETECTED   Haemophilus influenzae NOT DETECTED NOT DETECTED   Neisseria meningitidis NOT DETECTED NOT DETECTED   Pseudomonas aeruginosa NOT DETECTED NOT DETECTED   Stenotrophomonas maltophilia NOT DETECTED NOT DETECTED   Candida albicans NOT DETECTED NOT DETECTED   Candida auris NOT DETECTED NOT DETECTED   Candida glabrata NOT DETECTED NOT DETECTED   Candida krusei NOT DETECTED NOT DETECTED   Candida parapsilosis NOT DETECTED NOT DETECTED   Candida tropicalis NOT DETECTED NOT DETECTED   Cryptococcus neoformans/gattii NOT DETECTED NOT DETECTED   Methicillin resistance mecA/C DETECTED (A) NOT DETECTED     Gretta Arab PharmD, BCPS Clinical Pharmacist [facility] main pharmacy [phone] 06/06/2021 9:02 AM

## 2021-06-07 DIAGNOSIS — R7881 Bacteremia: Secondary | ICD-10-CM | POA: Diagnosis not present

## 2021-06-07 DIAGNOSIS — M069 Rheumatoid arthritis, unspecified: Secondary | ICD-10-CM

## 2021-06-07 DIAGNOSIS — B957 Other staphylococcus as the cause of diseases classified elsewhere: Secondary | ICD-10-CM

## 2021-06-07 DIAGNOSIS — U071 COVID-19: Secondary | ICD-10-CM | POA: Diagnosis not present

## 2021-06-07 DIAGNOSIS — Z96611 Presence of right artificial shoulder joint: Secondary | ICD-10-CM

## 2021-06-07 LAB — CBC WITH DIFFERENTIAL/PLATELET
Abs Immature Granulocytes: 0.01 10*3/uL (ref 0.00–0.07)
Basophils Absolute: 0 10*3/uL (ref 0.0–0.1)
Basophils Relative: 0 %
Eosinophils Absolute: 0 10*3/uL (ref 0.0–0.5)
Eosinophils Relative: 0 %
HCT: 36.3 % (ref 36.0–46.0)
Hemoglobin: 12 g/dL (ref 12.0–15.0)
Immature Granulocytes: 0 %
Lymphocytes Relative: 9 %
Lymphs Abs: 0.4 10*3/uL — ABNORMAL LOW (ref 0.7–4.0)
MCH: 30.9 pg (ref 26.0–34.0)
MCHC: 33.1 g/dL (ref 30.0–36.0)
MCV: 93.6 fL (ref 80.0–100.0)
Monocytes Absolute: 0.3 10*3/uL (ref 0.1–1.0)
Monocytes Relative: 8 %
Neutro Abs: 3.5 10*3/uL (ref 1.7–7.7)
Neutrophils Relative %: 83 %
Platelets: 101 10*3/uL — ABNORMAL LOW (ref 150–400)
RBC: 3.88 MIL/uL (ref 3.87–5.11)
RDW: 14.4 % (ref 11.5–15.5)
WBC: 4.3 10*3/uL (ref 4.0–10.5)
nRBC: 0 % (ref 0.0–0.2)

## 2021-06-07 LAB — COMPREHENSIVE METABOLIC PANEL
ALT: 22 U/L (ref 0–44)
AST: 49 U/L — ABNORMAL HIGH (ref 15–41)
Albumin: 3.1 g/dL — ABNORMAL LOW (ref 3.5–5.0)
Alkaline Phosphatase: 61 U/L (ref 38–126)
Anion gap: 8 (ref 5–15)
BUN: 16 mg/dL (ref 8–23)
CO2: 28 mmol/L (ref 22–32)
Calcium: 9.9 mg/dL (ref 8.9–10.3)
Chloride: 100 mmol/L (ref 98–111)
Creatinine, Ser: 0.71 mg/dL (ref 0.44–1.00)
GFR, Estimated: >60 mL/min (ref >60)
Glucose, Bld: 128 mg/dL — ABNORMAL HIGH (ref 70–99)
Potassium: 4.2 mmol/L (ref 3.5–5.1)
Sodium: 136 mmol/L (ref 135–145)
Total Bilirubin: 0.8 mg/dL (ref 0.3–1.2)
Total Protein: 5.2 g/dL — ABNORMAL LOW (ref 6.5–8.1)

## 2021-06-07 LAB — C-REACTIVE PROTEIN: CRP: 11.7 mg/dL — ABNORMAL HIGH (ref <1.0)

## 2021-06-07 LAB — D-DIMER, QUANTITATIVE: D-Dimer, Quant: 0.62 ug/mL-FEU — ABNORMAL HIGH (ref 0.00–0.50)

## 2021-06-07 NOTE — Consult Note (Signed)
[facility]    Date of Admission:  06/05/2021     Reason for Consult: bacteremia    Referring Provider: Florene Glen    Lines:  Peripheral iv's  Abx: 7/30-c vanc 7/30-c remdesivir  Other: 7/30-c methylpred         Assessment: Staph epi bacteremia Covid infection moderate intensity RA on methotrexate/plaquenil   75 yo female hx right shoulder arthroplasty distant past, RA admitted for 5 days flu like sx with progressive sorethroat/malaise/dyspnea, tested positive for covid, also found to have staph epi bacteremia  7/29 bcx 2 sets with staph epi and 1 set with staph hominis, although of note both sets were drawn at same time. No vascular catheter/cardiac device  Covid 88% ra on presentation max o2 supplement 2 liters, currently on 1 liter with mid 90s. Has been receiving steroid/remdesivir  Patient reported feeling much better, now on room air, with covid meds, before vanc added   She has no shoulder sx on the right   Given preclinical hx low for endocarditis, will see how repeat bcx and tte show; if those are negative could treat for 7 days for mrse bacteremia and f/u to see if s/s of endocarditis     Plan: F/u repeat bcx Tte If bcx and tte negative, can treat 7 days; abx adjustment per sensitivity. She could be seen in ID clinic in 2-3 weeks after treatment to monitor for s/s of endocarditis Continue vanc for now ID team tomorrow will follow up final repeat bcx and tte Defer to primary team for covid management  Discussed with primary team   I spent 60 minute reviewing data/chart, and coordinating care and >50% direct face to face time providing counseling/discussing diagnostics/treatment plan with patient   ------------------------------------------------ Principal Problem:   COVID-19 Active Problems:   Essential hypertension   Rheumatoid arthritis ([place])   Hyperlipidemia, mixed   Hypothyroidism   Hyponatremia    COVID-19 virus infection    HPI: [name] is a 75 y.o. female with hx ra and hx right shoulder arthroplasty admitted with flu like sx in setting covid, with bcx mrse   Patient has 5 days sorethroat, myalgia, fatigue, poor appetite.  Associated subjective fever  No joint pain/back pain No n/v/diarrhea No rash  On admission has low grade fever, but no leukocytosis 88% o2 room air Treated with steroid/remdesivir for covid and feels better Bcx on admission grew staph epi/hominis; vanc added yesterday but already feelign better  Right shoulder no issue No chest pain No back pain No other joint pain  RA controlled on methotrexate/plaquenil  On room air today Eating better Feelign stronger/more energetic   Family History  Problem Relation Age of Onset   Lung cancer Mother    Stomach cancer Father    Heart attack Father        MI at age 74   Colon cancer Maternal Grandfather    Heart attack Brother        MI at age 62   Prostate cancer Paternal Grandfather    Breast cancer Paternal Aunt     Social History   Tobacco Use   Smoking status: Former    Types: Cigarettes    Quit date: 10/25/1995    Years since quitting: 25.6   Smokeless tobacco: Never   Tobacco comments:    Maryann Conners [id number]  Substance Use Topics   Alcohol use: Yes    Alcohol/week: 0.0 standard drinks  Comment: occasional   Drug use: No    Allergies  Allergen Reactions   Ace Inhibitors Cough   Ambien [Zolpidem] Other (See Comments)    Odd Feeling   Crestor [Rosuvastatin]    Pravastatin    Prozac [Fluoxetine Hcl] Other (See Comments)    Decreased libido   Zoloft [Sertraline Hcl]     Review of Systems: ROS All Other ROS was negative, except mentioned above   Past Medical History:  Diagnosis Date   Anemia    Hx of    Arthritis    Calculus of bile duct without mention of cholecystitis or obstruction    Cataract    GERD (gastroesophageal reflux disease)    H. pylori infection     Hx of    Heart murmur    Hyperlipidemia    Hypertension    Hypothyroid    PUD (peptic ulcer disease)    Rheumatoid arthritis(714.0)        Scheduled Meds:  vitamin C  1,000 mg Oral Daily   aspirin  81 mg Oral Daily   cholecalciferol  5,000 Units Oral Once per day on Tue Thu Sat   enoxaparin (LOVENOX) injection  40 mg Subcutaneous [id number]   folic acid  3 mg Oral Daily   gabapentin  900 mg Oral QHS   guaiFENesin  1,200 mg Oral BID   irbesartan  150 mg Oral Daily   levothyroxine  137 mcg Oral QAC breakfast   methylPREDNISolone (SOLU-MEDROL) injection  40 mg Intravenous Q12H   traZODone  150 mg Oral QHS   zinc sulfate  220 mg Oral Daily   Continuous Infusions:  remdesivir 100 mg in NS 100 mL 100 mg (06/07/21 1049)   vancomycin     PRN Meds:.acetaminophen **OR** acetaminophen, hydrALAZINE, melatonin, menthol-cetylpyridinium   OBJECTIVE: Blood pressure 127/61, pulse (!) 54, temperature 99.2 F (37.3 C), temperature source Oral, resp. rate 18, height '5\' 1"'$  (1.549 m), weight 65.8 kg, SpO2 94 %.  Physical Exam General/constitutional: no distress, pleasant HEENT: Normocephalic, PER, Conj Clear, EOMI, Oropharynx clear Neck supple CV: rrr no mrg Lungs: clear to auscultation, normal respiratory effort Abd: Soft, Nontender Ext: no edema Skin: No Rash Neuro: nonfocal MSK: no peripheral joint swelling/tenderness/warmth; back spines nontender Psych alert/oriented   Central line presence: no    Lab Results Lab Results  Component Value Date   WBC 4.3 06/07/2021   HGB 12.0 06/07/2021   HCT 36.3 06/07/2021   MCV 93.6 06/07/2021   PLT 101 (L) 06/07/2021    Lab Results  Component Value Date   CREATININE 0.71 06/07/2021   BUN 16 06/07/2021   NA 136 06/07/2021   K 4.2 06/07/2021   CL 100 06/07/2021   CO2 28 06/07/2021    Lab Results  Component Value Date   ALT 22 06/07/2021   AST 49 (H) 06/07/2021   ALKPHOS 61 06/07/2021   BILITOT 0.8 06/07/2021       Microbiology: Recent Results (from the past 240 hour(s))  Blood Culture (routine x 2)     Status: Abnormal (Preliminary result)   Collection Time: 06/05/21  9:48 AM   Specimen: BLOOD  Result Value Ref Range Status   Specimen Description   Final    BLOOD RIGHT ANTECUBITAL Performed at [facility], [place] [address] [place], [place] [address]    Special Requests   Final    BOTTLES DRAWN AEROBIC AND ANAEROBIC Blood Culture results may not be optimal due to an inadequate volume of  blood received in culture bottles Performed at [facility], [place] [address], [place], [place] [address]    Culture  Setup Time   Final    GRAM POSITIVE COCCI IN CLUSTERS ANAEROBIC BOTTLE ONLY CRITICAL VALUE NOTED.  VALUE IS CONSISTENT WITH PREVIOUSLY REPORTED AND CALLED VALUE. Performed at [facility], [place] [address], [place], [place] [address]    Culture STAPHYLOCOCCUS EPIDERMIDIS (A)  Final   Report Status PENDING  Incomplete  Blood Culture (routine x 2)     Status: Abnormal (Preliminary result)   Collection Time: 06/05/21 10:02 AM   Specimen: BLOOD  Result Value Ref Range Status   Specimen Description   Final    BLOOD LEFT ANTECUBITAL Performed at [place] [address], [place], [place] [address]    Special Requests   Final    BOTTLES DRAWN AEROBIC AND ANAEROBIC Blood Culture results may not be optimal due to an inadequate volume of blood received in culture bottles Performed at [place] [address] [place], [place] [address]    Culture  Setup Time   Final    IN BOTH AEROBIC AND ANAEROBIC BOTTLES GRAM POSITIVE COCCI IN CLUSTERS CRITICAL RESULT CALLED TO, READ BACK BY AND VERIFIED WITH: PHARM D C.SHADE ON [id number] AT 0902 BY E.PARRISH    Culture (A)  Final    STAPHYLOCOCCUS EPIDERMIDIS STAPHYLOCOCCUS HOMINIS THE SIGNIFICANCE OF ISOLATING THIS ORGANISM FROM A SINGLE SET OF BLOOD CULTURES WHEN  MULTIPLE SETS ARE DRAWN IS UNCERTAIN. PLEASE NOTIFY THE MICROBIOLOGY DEPARTMENT WITHIN ONE WEEK IF SPECIATION AND SENSITIVITIES ARE REQUIRED. SUSCEPTIBILITIES TO FOLLOW Performed at [facility], [place] [address], [place], [place] [address]    Report Status PENDING  Incomplete  Blood Culture ID Panel (Reflexed)     Status: Abnormal   Collection Time: 06/05/21 10:02 AM  Result Value Ref Range Status   Enterococcus faecalis NOT DETECTED NOT DETECTED Final   Enterococcus Faecium NOT DETECTED NOT DETECTED Final   Listeria monocytogenes NOT DETECTED NOT DETECTED Final   Staphylococcus species DETECTED (A) NOT DETECTED Final    Comment: CRITICAL RESULT CALLED TO, READ BACK BY AND VERIFIED WITH: PHARM D C.SHADE ON [id number] AT 0902 BY E.PARRISH    Staphylococcus aureus (BCID) NOT DETECTED NOT DETECTED Final   Staphylococcus epidermidis DETECTED (A) NOT DETECTED Final    Comment: Methicillin (oxacillin) resistant coagulase negative staphylococcus. Possible blood culture contaminant (unless isolated from more than one blood culture draw or clinical case suggests pathogenicity). No antibiotic treatment is indicated for blood  culture contaminants. CRITICAL RESULT CALLED TO, READ BACK BY AND VERIFIED WITH: PHARM D C.SHADE ON [id number] AT 0902 BY E.PARRISH    Staphylococcus lugdunensis NOT DETECTED NOT DETECTED Final   Streptococcus species NOT DETECTED NOT DETECTED Final   Streptococcus agalactiae NOT DETECTED NOT DETECTED Final   Streptococcus pneumoniae NOT DETECTED NOT DETECTED Final   Streptococcus pyogenes NOT DETECTED NOT DETECTED Final   A.calcoaceticus-baumannii NOT DETECTED NOT DETECTED Final   Bacteroides fragilis NOT DETECTED NOT DETECTED Final   Enterobacterales NOT DETECTED NOT DETECTED Final   Enterobacter cloacae complex NOT DETECTED NOT DETECTED Final   Escherichia coli NOT DETECTED NOT DETECTED Final   Klebsiella aerogenes NOT DETECTED NOT DETECTED Final   Klebsiella oxytoca  NOT DETECTED NOT DETECTED Final   Klebsiella pneumoniae NOT DETECTED NOT DETECTED Final   Proteus species NOT DETECTED NOT DETECTED Final   Salmonella species NOT DETECTED NOT DETECTED Final  Serratia marcescens NOT DETECTED NOT DETECTED Final   Haemophilus influenzae NOT DETECTED NOT DETECTED Final   Neisseria meningitidis NOT DETECTED NOT DETECTED Final   Pseudomonas aeruginosa NOT DETECTED NOT DETECTED Final   Stenotrophomonas maltophilia NOT DETECTED NOT DETECTED Final   Candida albicans NOT DETECTED NOT DETECTED Final   Candida auris NOT DETECTED NOT DETECTED Final   Candida glabrata NOT DETECTED NOT DETECTED Final   Candida krusei NOT DETECTED NOT DETECTED Final   Candida parapsilosis NOT DETECTED NOT DETECTED Final   Candida tropicalis NOT DETECTED NOT DETECTED Final   Cryptococcus neoformans/gattii NOT DETECTED NOT DETECTED Final   Methicillin resistance mecA/C DETECTED (A) NOT DETECTED Final    Comment: CRITICAL RESULT CALLED TO, READ BACK BY AND VERIFIED WITH: PHARM D C.SHADE ON [id number] AT 0902 BY E.PARRISH Performed at [facility], [place] [address] [place], [place] [address]   Resp Panel by RT-PCR (Flu A&B, Covid) Nasopharyngeal Swab     Status: Abnormal   Collection Time: 06/05/21 10:14 AM   Specimen: Nasopharyngeal Swab; Nasopharyngeal(NP) swabs in vial transport medium  Result Value Ref Range Status   SARS Coronavirus 2 by RT PCR POSITIVE (A) NEGATIVE Final    Comment: RESULT CALLED TO, READ BACK BY AND VERIFIED WITH: BENNET L. ON 06/05/2021 @ 1200 BY MECIAL J.  (NOTE) SARS-CoV-2 target nucleic acids are DETECTED.  The SARS-CoV-2 RNA is generally detectable in upper respiratory specimens during the acute phase of infection. Positive results are indicative of the presence of the identified virus, but do not rule out bacterial infection or co-infection with other pathogens not detected by the test. Clinical correlation with patient history and other diagnostic  information is necessary to determine patient infection status. The expected result is Negative.  Fact Sheet for Patients: EntrepreneurPulse.com.au  Fact Sheet for Healthcare Providers: IncredibleEmployment.be  This test is not yet approved or cleared by the [place] FDA and  has been authorized for detection and/or diagnosis of SARS-CoV-2 by FDA under an Emergency Use Authorization (EUA).  This EUA will remain in effect (meaning this  test can be used) for the duration of  the COVID-19 declaration under Section 564(b)(1) of the Act, 21 U.S.C. section 360bbb-3(b)(1), unless the authorization is terminated or revoked sooner.     Influenza A by PCR NEGATIVE NEGATIVE Final   Influenza B by PCR NEGATIVE NEGATIVE Final    Comment: (NOTE) The Xpert Xpress SARS-CoV-2/FLU/RSV plus assay is intended as an aid in the diagnosis of influenza from Nasopharyngeal swab specimens and should not be used as a sole basis for treatment. Nasal washings and aspirates are unacceptable for Xpert Xpress SARS-CoV-2/FLU/RSV testing.  Fact Sheet for Patients: EntrepreneurPulse.com.au  Fact Sheet for Healthcare Providers: IncredibleEmployment.be  This test is not yet approved or cleared by the [place] FDA and has been authorized for detection and/or diagnosis of SARS-CoV-2 by FDA under an Emergency Use Authorization (EUA). This EUA will remain in effect (meaning this test can be used) for the duration of the COVID-19 declaration under Section 564(b)(1) of the Act, 21 U.S.C. section 360bbb-3(b)(1), unless the authorization is terminated or revoked.  Performed at [facility], [place] [address] [place], [place] [address]      Serology:    Imaging: If present, new imagings (plain films, ct scans, and mri) have been personally visualized and interpreted; radiology reports have been reviewed. Decision  making incorporated into the Impression / Recommendations.  7/29 cxr  No focal disease   Jeorgia Helming  Darnelle Maffucci, [place] [facility] [place] [phone] pager    06/07/2021, 1:18 PM

## 2021-06-07 NOTE — Progress Notes (Signed)
Patient stated to day shift RT that she does not want her night time CPT.

## 2021-06-07 NOTE — Progress Notes (Signed)
Patient refused CPT for tonight

## 2021-06-07 NOTE — Progress Notes (Signed)
PROGRESS NOTE    [name]  MAY:[id number] DOB: [birth year] DOA: 06/05/2021 PCP: [name], MD   Chief Complaint  Patient presents with   Shortness of Breath    Covid positive     Brief Narrative:  THERMA LASURE is [name] 75 y.o. female with history of rheumatoid arthritis on methotrexate and Plaquenil, hypertension, hypothyroidism presents to the ER because of weakness.  Patient states she has been feeling weak tired with sore throat and cough over the last 5 days.  Patient was tested positive at home with [name] home COVID test kit.  Since sore throat and had [name] weakness of the beginning worse patient decided come to the ER.  Denies any chest pain, nausea vomiting or diarrhea.  Has been having poor appetite.   ED Course: In the ER patient was afebrile and not hypoxic.  Chest x-ray does not show any infiltrates.  Labs are significant for sodium of 129 CRP of 8.3 D-dimer 1.94 COVID test was positive.  Patient's platelets is 102.  Patient was started on fluids.  Patient was sent on remdesivir.  Admitted for COVID-19 infection with dehydration.  Assessment & Plan:   Principal Problem:   COVID-19 Active Problems:   Essential hypertension   Rheumatoid arthritis ([place])   Hyperlipidemia, mixed   Hypothyroidism   S/P shoulder replacement, right   Hyponatremia   COVID-19 virus infection   Staphylococcus epidermidis bacteremia  COVID-19 infection - Weaned to RA - fever to 100.8 on 7/30 - CXR without focal airspace disease - CRP peaked at 15.1, downtrending  - Remdesivir 7/29-present - Solumedrol 7/30-present - strict I/O, daily weights - inflammatory labs  - OOB, IS, flutter  COVID-19 Labs  Recent Labs    06/05/21 0953 06/06/21 0418 06/07/21 0419  DDIMER 0.94* 0.84* 0.62*  FERRITIN 264  --   --   LDH 315*  --   --   CRP 8.3* 15.1* 11.7*    Lab Results  Component Value Date   SARSCOV2NAA POSITIVE ([name]) 06/05/2021    MRSE bacteremia  Staph Hominis Bacteremia   Coag Negative Bacteremia -staph epi in 3/4 bottles, staph hominis in 1/2 sets -continue vancomycin -repeat blood cultures -follow TTE -ID c/s, appreciate recs   Hyponatremia -improved    Hypothyroidism -Continue Synthroid   Hypertension -Blood pressure is appropriate today -Continue irbesartan; hydrochlorothiazide on hold due to hyponatremia   History of rheumatoid arthritis -Continue Plaquenil (on hold with remdesivir), methotrexate   Thrombocytopenia -relatively stable, likely 2/2 acute illness   DVT prophylaxis: lovenox Code Status: full  Family Communication: husband at bedside Disposition:   Status is: Inpatient  Remains inpatient appropriate because:Inpatient level of care appropriate due to severity of illness  Dispo: The patient is from: Home              Anticipated d/c is to: Home              Patient currently is not medically stable to d/c.   Difficult to place patient No       Consultants:  ID  Procedures:  none  Antimicrobials:  Anti-infectives (From admission, onward)    Start     Dose/Rate Route Frequency Ordered Stop   06/07/21 2200  vancomycin (VANCOREADY) IVPB 1250 mg/250 mL        1,250 mg 166.7 mL/hr over 90 Minutes Intravenous Every 36 hours 06/06/21 1034     06/06/21 1030  vancomycin (VANCOCIN) IVPB 1000 mg/200 mL premix  1,000 mg 200 mL/hr over 60 Minutes Intravenous  Once 06/06/21 1015 06/06/21 1449   06/06/21 1000  remdesivir 100 mg in sodium chloride 0.9 % 100 mL IVPB       See Hyperspace for full Linked Orders Report.   100 mg 200 mL/hr over 30 Minutes Intravenous Daily 06/05/21 1211 06/10/21 0959   06/05/21 2200  hydroxychloroquine (PLAQUENIL) tablet 200 mg  Status:  Discontinued        200 mg Oral 2 times daily 06/05/21 1622 06/05/21 1850   06/05/21 2000  amoxicillin (AMOXIL) capsule 500 mg  Status:  Discontinued        500 mg Oral Every 8 hours 06/05/21 1624 06/06/21 1422   06/05/21 1330  remdesivir 200 mg in sodium  chloride 0.9% 250 mL IVPB       See Hyperspace for full Linked Orders Report.   200 mg 580 mL/hr over 30 Minutes Intravenous Once 06/05/21 1211 06/05/21 1851          Subjective: Feels better  Objective: Vitals:   06/06/21 1425 06/06/21 2007 06/06/21 2200 06/07/21 0436  BP: (!) 146/77 (!) 158/67  127/61  Pulse: 67 (!) 58  (!) 54  Resp:  20  18  Temp: 98.8 F (37.1 C) 98.5 F (36.9 C)  99.2 F (37.3 C)  TempSrc: Oral Oral  Oral  SpO2: 99% 98% 97% 94%  Weight:      Height:       No intake or output data in the 24 hours ending 06/07/21 1453 Filed Weights   06/05/21 0939  Weight: 65.8 kg    Examination:  General exam: Appears calm and comfortable  Respiratory system: Clear to auscultation. Respiratory effort normal. Cardiovascular system: S1 & S2 heard, RRR. Gastrointestinal system: Abdomen is nondistended, soft and nontender. Central nervous system: Alert and oriented. No focal neurological deficits. Extremities: no LEE Skin: No rashes, lesions or ulcers Psychiatry: Judgement and insight appear normal. Mood & affect appropriate.     Data Reviewed: I have personally reviewed following labs and imaging studies  CBC: Recent Labs  Lab 06/05/21 0953 06/06/21 0418 06/07/21 0419  WBC 10.5 7.8 4.3  NEUTROABS 9.7* 6.7 3.5  HGB 12.6 11.9* 12.0  HCT 37.4 35.3* 36.3  MCV 93.5 93.1 93.6  PLT 102* 94* 101*    Basic Metabolic Panel: Recent Labs  Lab 06/05/21 0953 06/06/21 0418 06/07/21 0419  NA 129* 133* 136  K 4.2 3.8 4.2  CL 94* 98 100  CO2 _0 GLUCOSE 103* 93 128*  BUN _1 CREATININE 0.78 0.70 0.71  CALCIUM 9.6 9.2 9.9    GFR: Estimated Creatinine Clearance: 53.6 mL/min (by C-G formula based on SCr of 0.71 mg/dL).  Liver Function Tests: Recent Labs  Lab 06/05/21 0953 06/06/21 0418 06/07/21 0419  AST 48* 47* 49*  ALT _2 ALKPHOS 82 75 61  BILITOT 0.9 0.8 0.8  PROT 6.2* 5.6* 5.2*  ALBUMIN 4.0 3.4* 3.1*    CBG: No  results for input(s): GLUCAP in the last 168 hours.   Recent Results (from the past 240 hour(s))  Blood Culture (routine x 2)     Status: Abnormal (Preliminary result)   Collection Time: 06/05/21  9:48 AM   Specimen: BLOOD  Result Value Ref Range Status   Specimen Description   Final    BLOOD RIGHT ANTECUBITAL Performed at [facility] [address] [place], [place] [address]    Special Requests  Final    BOTTLES DRAWN AEROBIC AND ANAEROBIC Blood Culture results may not be optimal due to an inadequate volume of blood received in culture bottles Performed at [facility], [place] [address] [place], [place] [address]    Culture  Setup Time   Final    GRAM POSITIVE COCCI IN CLUSTERS ANAEROBIC BOTTLE ONLY CRITICAL VALUE NOTED.  VALUE IS CONSISTENT WITH PREVIOUSLY REPORTED AND CALLED VALUE. Performed at [facility], [place] [address], [place], [place] [address]    Culture STAPHYLOCOCCUS EPIDERMIDIS (Stclair Szymborski)  Final   Report Status PENDING  Incomplete  Blood Culture (routine x 2)     Status: Abnormal (Preliminary result)   Collection Time: 06/05/21 10:02 AM   Specimen: BLOOD  Result Value Ref Range Status   Specimen Description   Final    BLOOD LEFT ANTECUBITAL Performed at [facility] [address] [place], [place] [address]    Special Requests   Final    BOTTLES DRAWN AEROBIC AND ANAEROBIC Blood Culture results may not be optimal due to an inadequate volume of blood received in culture bottles Performed at [facility] [address] [place], [place] [address]    Culture  Setup Time   Final    IN BOTH AEROBIC AND ANAEROBIC BOTTLES GRAM POSITIVE COCCI IN CLUSTERS CRITICAL RESULT CALLED TO, READ BACK BY AND VERIFIED WITH: PHARM D C.SHADE ON [id number] AT 0902 BY E.PARRISH    Culture (Fermin Yan)  Final    STAPHYLOCOCCUS EPIDERMIDIS STAPHYLOCOCCUS HOMINIS THE SIGNIFICANCE OF ISOLATING THIS ORGANISM FROM Margarita Bobrowski  SINGLE SET OF BLOOD CULTURES WHEN MULTIPLE SETS ARE DRAWN IS UNCERTAIN. PLEASE NOTIFY THE MICROBIOLOGY DEPARTMENT WITHIN ONE WEEK IF SPECIATION AND SENSITIVITIES ARE REQUIRED. SUSCEPTIBILITIES TO FOLLOW Performed at [facility], [place] [address], [place], [place] [address]    Report Status PENDING  Incomplete  Blood Culture ID Panel (Reflexed)     Status: Abnormal   Collection Time: 06/05/21 10:02 AM  Result Value Ref Range Status   Enterococcus faecalis NOT DETECTED NOT DETECTED Final   Enterococcus Faecium NOT DETECTED NOT DETECTED Final   Listeria monocytogenes NOT DETECTED NOT DETECTED Final   Staphylococcus species DETECTED (Kerry Chisolm) NOT DETECTED Final    Comment: CRITICAL RESULT CALLED TO, READ BACK BY AND VERIFIED WITH: PHARM D C.SHADE ON [id number] AT 0902 BY E.PARRISH    Staphylococcus aureus (BCID) NOT DETECTED NOT DETECTED Final   Staphylococcus epidermidis DETECTED (Eudora Guevarra) NOT DETECTED Final    Comment: Methicillin (oxacillin) resistant coagulase negative staphylococcus. Possible blood culture contaminant (unless isolated from more than one blood culture draw or clinical case suggests pathogenicity). No antibiotic treatment is indicated for blood  culture contaminants. CRITICAL RESULT CALLED TO, READ BACK BY AND VERIFIED WITH: PHARM D C.SHADE ON [id number] AT 0902 BY E.PARRISH    Staphylococcus lugdunensis NOT DETECTED NOT DETECTED Final   Streptococcus species NOT DETECTED NOT DETECTED Final   Streptococcus agalactiae NOT DETECTED NOT DETECTED Final   Streptococcus pneumoniae NOT DETECTED NOT DETECTED Final   Streptococcus pyogenes NOT DETECTED NOT DETECTED Final   Luceil Herrin.calcoaceticus-baumannii NOT DETECTED NOT DETECTED Final   Bacteroides fragilis NOT DETECTED NOT DETECTED Final   Enterobacterales NOT DETECTED NOT DETECTED Final   Enterobacter cloacae complex NOT DETECTED NOT DETECTED Final   Escherichia coli NOT DETECTED NOT DETECTED Final   Klebsiella aerogenes NOT DETECTED NOT  DETECTED Final   Klebsiella oxytoca NOT DETECTED NOT DETECTED Final   Klebsiella pneumoniae NOT DETECTED NOT  DETECTED Final   Proteus species NOT DETECTED NOT DETECTED Final   Salmonella species NOT DETECTED NOT DETECTED Final   Serratia marcescens NOT DETECTED NOT DETECTED Final   Haemophilus influenzae NOT DETECTED NOT DETECTED Final   Neisseria meningitidis NOT DETECTED NOT DETECTED Final   Pseudomonas aeruginosa NOT DETECTED NOT DETECTED Final   Stenotrophomonas maltophilia NOT DETECTED NOT DETECTED Final   Candida albicans NOT DETECTED NOT DETECTED Final   Candida auris NOT DETECTED NOT DETECTED Final   Candida glabrata NOT DETECTED NOT DETECTED Final   Candida krusei NOT DETECTED NOT DETECTED Final   Candida parapsilosis NOT DETECTED NOT DETECTED Final   Candida tropicalis NOT DETECTED NOT DETECTED Final   Cryptococcus neoformans/gattii NOT DETECTED NOT DETECTED Final   Methicillin resistance mecA/C DETECTED (Barrett Holthaus) NOT DETECTED Final    Comment: CRITICAL RESULT CALLED TO, READ BACK BY AND VERIFIED WITH: PHARM D C.SHADE ON [id number] AT 0902 BY E.PARRISH Performed at [facility], [place] [address], [place], [place] [address]   Resp Panel by RT-PCR (Flu Setareh Rom&B, Covid) Nasopharyngeal Swab     Status: Abnormal   Collection Time: 06/05/21 10:14 AM   Specimen: Nasopharyngeal Swab; Nasopharyngeal(NP) swabs in vial transport medium  Result Value Ref Range Status   SARS Coronavirus 2 by RT PCR POSITIVE (Gid Schoffstall) NEGATIVE Final    Comment: RESULT CALLED TO, READ BACK BY AND VERIFIED WITH: BENNET L. ON 06/05/2021 @ 1200 BY MECIAL J.  (NOTE) SARS-CoV-2 target nucleic acids are DETECTED.  The SARS-CoV-2 RNA is generally detectable in upper respiratory specimens during the acute phase of infection. Positive results are indicative of the presence of the identified virus, but do not rule out bacterial infection or co-infection with other pathogens not detected by the test. Clinical correlation with  patient history and other diagnostic information is necessary to determine patient infection status. The expected result is Negative.  Fact Sheet for Patients: EntrepreneurPulse.com.au  Fact Sheet for Healthcare Providers: IncredibleEmployment.be  This test is not yet approved or cleared by the [place] FDA and  has been authorized for detection and/or diagnosis of SARS-CoV-2 by FDA under an Emergency Use Authorization (EUA).  This EUA will remain in effect (meaning this  test can be used) for the duration of  the COVID-19 declaration under Section 564(b)(1) of the Act, 21 U.S.C. section 360bbb-3(b)(1), unless the authorization is terminated or revoked sooner.     Influenza Setareh Rom by PCR NEGATIVE NEGATIVE Final   Influenza B by PCR NEGATIVE NEGATIVE Final    Comment: (NOTE) The Xpert Xpress SARS-CoV-2/FLU/RSV plus assay is intended as an aid in the diagnosis of influenza from Nasopharyngeal swab specimens and should not be used as Eoghan Belcher sole basis for treatment. Nasal washings and aspirates are unacceptable for Xpert Xpress SARS-CoV-2/FLU/RSV testing.  Fact Sheet for Patients: EntrepreneurPulse.com.au  Fact Sheet for Healthcare Providers: IncredibleEmployment.be  This test is not yet approved or cleared by the [place] FDA and has been authorized for detection and/or diagnosis of SARS-CoV-2 by FDA under an Emergency Use Authorization (EUA). This EUA will remain in effect (meaning this test can be used) for the duration of the COVID-19 declaration under Section 564(b)(1) of the Act, 21 U.S.C. section 360bbb-3(b)(1), unless the authorization is terminated or revoked.  Performed at [facility], [place] [address] [place], [place] [address]          Radiology Studies: No results found.      Scheduled Meds:  vitamin C  1,000 mg Oral  Daily   aspirin  81 mg Oral Daily    cholecalciferol  5,000 Units Oral Once per day on Tue Thu Sat   enoxaparin (LOVENOX) injection  40 mg Subcutaneous [id number]   folic acid  3 mg Oral Daily   gabapentin  900 mg Oral QHS   guaiFENesin  1,200 mg Oral BID   irbesartan  150 mg Oral Daily   levothyroxine  137 mcg Oral QAC breakfast   methylPREDNISolone (SOLU-MEDROL) injection  40 mg Intravenous Q12H   traZODone  150 mg Oral QHS   zinc sulfate  220 mg Oral Daily   Continuous Infusions:  remdesivir 100 mg in NS 100 mL 100 mg (06/07/21 1049)   vancomycin       LOS: 1 day    Time spent: over 30 min    Fayrene Helper, MD [facility]   To contact the attending provider between 7A-7P or the covering provider during after hours 7P-7A, please log into the web site [email or web address] and access using universal [place] password for that web site. If you do not have the password, please call the hospital operator.  06/07/2021, 2:53 PM

## 2021-06-08 ENCOUNTER — Inpatient Hospital Stay (HOSPITAL_COMMUNITY): Payer: Medicare PPO

## 2021-06-08 DIAGNOSIS — Z96611 Presence of right artificial shoulder joint: Secondary | ICD-10-CM | POA: Diagnosis not present

## 2021-06-08 DIAGNOSIS — U071 COVID-19: Secondary | ICD-10-CM | POA: Diagnosis not present

## 2021-06-08 DIAGNOSIS — R7881 Bacteremia: Secondary | ICD-10-CM | POA: Diagnosis not present

## 2021-06-08 DIAGNOSIS — M069 Rheumatoid arthritis, unspecified: Secondary | ICD-10-CM | POA: Diagnosis not present

## 2021-06-08 LAB — ECHOCARDIOGRAM COMPLETE
Area-P 1/2: 4.49 cm2
Height: 61 in
S' Lateral: 2.9 cm
Weight: 2320 oz

## 2021-06-08 LAB — CBC WITH DIFFERENTIAL/PLATELET
Abs Immature Granulocytes: 0.02 10*3/uL (ref 0.00–0.07)
Basophils Absolute: 0 10*3/uL (ref 0.0–0.1)
Basophils Relative: 0 %
Eosinophils Absolute: 0 10*3/uL (ref 0.0–0.5)
Eosinophils Relative: 0 %
HCT: 34.7 % — ABNORMAL LOW (ref 36.0–46.0)
Hemoglobin: 11.7 g/dL — ABNORMAL LOW (ref 12.0–15.0)
Immature Granulocytes: 0 %
Lymphocytes Relative: 8 %
Lymphs Abs: 0.5 10*3/uL — ABNORMAL LOW (ref 0.7–4.0)
MCH: 31.2 pg (ref 26.0–34.0)
MCHC: 33.7 g/dL (ref 30.0–36.0)
MCV: 92.5 fL (ref 80.0–100.0)
Monocytes Absolute: 0.5 10*3/uL (ref 0.1–1.0)
Monocytes Relative: 9 %
Neutro Abs: 4.8 10*3/uL (ref 1.7–7.7)
Neutrophils Relative %: 83 %
Platelets: 111 10*3/uL — ABNORMAL LOW (ref 150–400)
RBC: 3.75 MIL/uL — ABNORMAL LOW (ref 3.87–5.11)
RDW: 14.3 % (ref 11.5–15.5)
WBC: 5.8 10*3/uL (ref 4.0–10.5)
nRBC: 0 % (ref 0.0–0.2)

## 2021-06-08 LAB — COMPREHENSIVE METABOLIC PANEL
ALT: 25 U/L (ref 0–44)
AST: 75 U/L — ABNORMAL HIGH (ref 15–41)
Albumin: 3 g/dL — ABNORMAL LOW (ref 3.5–5.0)
Alkaline Phosphatase: 56 U/L (ref 38–126)
Anion gap: 6 (ref 5–15)
BUN: 17 mg/dL (ref 8–23)
CO2: 26 mmol/L (ref 22–32)
Calcium: 9.4 mg/dL (ref 8.9–10.3)
Chloride: 100 mmol/L (ref 98–111)
Creatinine, Ser: 0.74 mg/dL (ref 0.44–1.00)
GFR, Estimated: >60 mL/min (ref >60)
Glucose, Bld: 125 mg/dL — ABNORMAL HIGH (ref 70–99)
Potassium: 4.9 mmol/L (ref 3.5–5.1)
Sodium: 132 mmol/L — ABNORMAL LOW (ref 135–145)
Total Bilirubin: 1.4 mg/dL — ABNORMAL HIGH (ref 0.3–1.2)
Total Protein: 5 g/dL — ABNORMAL LOW (ref 6.5–8.1)

## 2021-06-08 LAB — CULTURE, BLOOD (ROUTINE X 2)

## 2021-06-08 LAB — C-REACTIVE PROTEIN: CRP: 5.6 mg/dL — ABNORMAL HIGH (ref <1.0)

## 2021-06-08 LAB — D-DIMER, QUANTITATIVE: D-Dimer, Quant: 0.45 ug/mL-FEU (ref 0.00–0.50)

## 2021-06-08 MED ORDER — PHENOL 1.4 % MT LIQD
1.0000 | OROMUCOSAL | Status: DC | PRN
  Administered 2021-06-08: 1 via OROMUCOSAL
  Filled 2021-06-08: qty 177

## 2021-06-08 MED ORDER — DEXAMETHASONE 4 MG PO TABS
6.0000 mg | ORAL_TABLET | Freq: Every day | ORAL | Status: AC
  Administered 2021-06-08 – 2021-06-09 (×2): 6 mg via ORAL
  Filled 2021-06-08 (×2): qty 1

## 2021-06-08 NOTE — Progress Notes (Signed)
[facility] for Infectious Disease  Date of Admission:  06/05/2021           Reason for visit: Follow up on bacteremia  Current antibiotics: Vancomycin Remdesivir    ASSESSMENT:    Staph epi bacteremia COVID RA on MTX/Plaquenil  PLAN:    Repeat blood cx are pending.  Will follow 06/05/21 cx with 1 of 2 bottles Staph Epi in one set and Staph Epi + Staph Hominis in 2 of 2 bottles.  Awaiting sensitivities as suspect this is probably a contaminant. C/w vanco for now TTE pending Monitor shoulder but currently no pain COVID tx per primary    Principal Problem:   COVID-19 Active Problems:   Essential hypertension   Rheumatoid arthritis ([facility])   Hyperlipidemia, mixed   Hypothyroidism   S/P shoulder replacement, right   Hyponatremia   COVID-19 virus infection   Staphylococcus epidermidis bacteremia    MEDICATIONS:    Scheduled Meds:  vitamin C  1,000 mg Oral Daily   aspirin  81 mg Oral Daily   cholecalciferol  5,000 Units Oral Once per day on Tue Thu Sat   dexamethasone  6 mg Oral Daily   enoxaparin (LOVENOX) injection  40 mg Subcutaneous [id number]   folic acid  3 mg Oral Daily   gabapentin  900 mg Oral QHS   guaiFENesin  1,200 mg Oral BID   levothyroxine  137 mcg Oral QAC breakfast   traZODone  150 mg Oral QHS   zinc sulfate  220 mg Oral Daily   Continuous Infusions:  remdesivir 100 mg in NS 100 mL 100 mg (06/08/21 0921)   vancomycin Stopped (06/07/21 2257)   PRN Meds:.acetaminophen **OR** acetaminophen, melatonin, menthol-cetylpyridinium  SUBJECTIVE:   24 hour events:  No acute events  No fevers, chills.  No shoulder pain.  Breathing is stable.  Mild cough.  No other complaints.   Review of Systems  All other systems reviewed and are negative.    OBJECTIVE:   Blood pressure (!) 147/73, pulse (!) 54, temperature 99.2 F (37.3 C), temperature source Oral, resp. rate 18, height '5\' 1"'$  (1.549 m), weight 65.8 kg, SpO2 92 %. Body mass index is 27.4  kg/m.  Physical Exam Constitutional:      General: She is not in acute distress.    Appearance: Normal appearance.  HENT:     Head: Normocephalic and atraumatic.  Pulmonary:     Effort: Pulmonary effort is normal. No respiratory distress.  Musculoskeletal:        General: No swelling or tenderness. Normal range of motion.  Skin:    General: Skin is warm and dry.     Findings: No rash.  Neurological:     General: No focal deficit present.     Mental Status: She is alert and oriented to person, place, and time.  Psychiatric:        Mood and Affect: Mood normal.        Behavior: Behavior normal.     Lab Results: Lab Results  Component Value Date   WBC 5.8 06/08/2021   HGB 11.7 (L) 06/08/2021   HCT 34.7 (L) 06/08/2021   MCV 92.5 06/08/2021   PLT 111 (L) 06/08/2021    Lab Results  Component Value Date   NA 132 (L) 06/08/2021   K 4.9 06/08/2021   CO2 26 06/08/2021   GLUCOSE 125 (H) 06/08/2021   BUN 17 06/08/2021   CREATININE 0.74 06/08/2021   CALCIUM 9.4 06/08/2021  GFRNONAA >60 06/08/2021   GFRAA 73 03/04/2021    Lab Results  Component Value Date   ALT 25 06/08/2021   AST 75 (H) 06/08/2021   ALKPHOS 56 06/08/2021   BILITOT 1.4 (H) 06/08/2021       Component Value Date/Time   CRP 5.6 (H) 06/08/2021 0325    No results found for: ESRSEDRATE   I have reviewed the micro and lab results in Epic.  Imaging: No results found.   Imaging independently reviewed in Epic.    Raynelle Highland for Infectious Disease [facility] [phone] pager 06/08/2021, 12:17 PM  I spent greater than 35 minutes with the patient including greater than 50% of time in face to face counsel of the patient and in coordination of their care.

## 2021-06-08 NOTE — Progress Notes (Signed)
PROGRESS NOTE    [name]  HWY:[id number] DOB: Sep 16, 1946 DOA: 06/05/2021 PCP: [name], MD   Chief Complaint  Patient presents with   Shortness of Breath    Covid positive     Brief Narrative:  [name] is [name] 75 y.o. female with history of rheumatoid arthritis Vazquez methotrexate and Plaquenil, hypertension, hypothyroidism presents to the ER because of weakness.  Patient states she has been feeling weak tired with sore throat and cough over the last 5 days.  Patient was tested positive at home with [name] home COVID test kit.  Since sore throat and had [name] weakness of the beginning worse patient decided come to the ER.  Denies any chest pain, nausea vomiting or diarrhea.  Has been having poor appetite.   ED Course: In the ER patient was afebrile and not hypoxic.  Chest x-ray does not show any infiltrates.  Labs are significant for sodium of 129 CRP of 8.3 D-dimer 1.94 COVID test was positive.  Patient's platelets is 102.  Patient was started Vazquez fluids.  Patient was sent Vazquez remdesivir.  Admitted for COVID-19 infection with dehydration.  Assessment & Plan:   Principal Problem:   COVID-19 Active Problems:   Essential hypertension   Rheumatoid arthritis ([place])   Hyperlipidemia, mixed   Hypothyroidism   S/P shoulder replacement, right   Hyponatremia   COVID-19 virus infection   Tammie epidermidis bacteremia  COVID-19 infection - Weaned to RA - fever to 100.8 Vazquez 7/30 - CXR without focal airspace disease - CRP peaked at 15.1, downtrending  - Remdesivir 7/29-present - Solumedrol 7/30-present, plan for taper with improvement - strict I/O, daily weights - inflammatory labs  - OOB, IS, flutter  COVID-19 Labs  Recent Labs    06/06/21 0418 06/07/21 0419 06/08/21 0325  DDIMER 0.84* 0.62* 0.45  CRP 15.1* 11.7* 5.6*    Lab Results  Component Value Date   Tammie POSITIVE (Jaxiel Kines) 06/05/2021    MRSE bacteremia  Staph Hominis Bacteremia  Coag Negative  Bacteremia -staph epi in 3/4 bottles, staph hominis in 1/2 sets -continue vancomycin -repeat blood cultures -follow TTE -ID c/s, appreciate recs   Hyponatremia -improved    Hypothyroidism -Continue Synthroid   Hypertension -Blood pressure is appropriate today -Continue irbesartan; hydrochlorothiazide Vazquez hold due to hyponatremia   History of rheumatoid arthritis -Continue Plaquenil (Vazquez hold with remdesivir), methotrexate   Thrombocytopenia -relatively stable, likely 2/2 acute illness   DVT prophylaxis: lovenox Code Status: full  Family Communication: husband at bedside Disposition:   Status is: Inpatient  Remains inpatient appropriate because:Inpatient level of care appropriate due to severity of illness  Dispo: The patient is from: Home              Anticipated d/c is to: Home              Patient currently is not medically stable to d/c.   Difficult to place patient No       Consultants:  ID  Procedures:  none  Antimicrobials:  Anti-infectives (From admission, onward)    Start     Dose/Rate Route Frequency Ordered Stop   06/07/21 2200  vancomycin (VANCOREADY) IVPB 1250 mg/250 mL        1,250 mg 166.7 mL/hr over 90 Minutes Intravenous Every 36 hours 06/06/21 1034     06/06/21 1030  vancomycin (VANCOCIN) IVPB 1000 mg/200 mL premix        1,000 mg 200 mL/hr over 60 Minutes Intravenous  Once 06/06/21  1015 06/06/21 1449   06/06/21 1000  remdesivir 100 mg in sodium chloride 0.9 % 100 mL IVPB       See Hyperspace for full Linked Orders Report.   100 mg 200 mL/hr over 30 Minutes Intravenous Daily 06/05/21 1211 06/10/21 0959   06/05/21 2200  hydroxychloroquine (PLAQUENIL) tablet 200 mg  Status:  Discontinued        200 mg Oral 2 times daily 06/05/21 1622 06/05/21 1850   06/05/21 2000  amoxicillin (AMOXIL) capsule 500 mg  Status:  Discontinued        500 mg Oral Every 8 hours 06/05/21 1624 06/06/21 1422   06/05/21 1330  remdesivir 200 mg in sodium chloride 0.9%  250 mL IVPB       See Hyperspace for full Linked Orders Report.   200 mg 580 mL/hr over 30 Minutes Intravenous Once 06/05/21 1211 06/05/21 1851          Subjective: No new complaints  Objective: Vitals:   06/07/21 1539 06/07/21 2032 06/08/21 0620 06/08/21 1240  BP: (!) 151/78 (!) 162/82 (!) 147/73 140/82  Pulse: (!) 52 (!) 51 (!) 54 (!) 51  Resp: '16 16 18 16  ' Temp: 97.9 F (36.6 C) 98 F (36.7 C) 99.2 F (37.3 C) 97.7 F (36.5 C)  TempSrc: Oral Oral Oral Oral  SpO2: 96% 99% 92% 94%  Weight:      Height:        Intake/Output Summary (Last 24 hours) at 06/08/2021 1528 Last data filed at 06/08/2021 0650 Gross per 24 hour  Intake 369.23 ml  Output --  Net 369.23 ml   Filed Weights   06/05/21 0939  Weight: 65.8 kg    Examination:  General: No acute distress. Cardiovascular: RRR Lungs: unlabored Abdomen: Soft, nontender, nondistended  Neurological: Alert and oriented 3. Moves all extremities 4 . Cranial nerves II through XII grossly intact. Skin: Warm and dry. No rashes or lesions. Extremities: No clubbing or cyanosis. No edema  Data Reviewed: I have personally reviewed following labs and imaging studies  CBC: Recent Labs  Lab 06/05/21 0953 06/06/21 0418 06/07/21 0419 06/08/21 0325  WBC 10.5 7.8 4.3 5.8  NEUTROABS 9.7* 6.7 3.5 4.8  HGB 12.6 11.9* 12.0 11.7*  HCT 37.4 35.3* 36.3 34.7*  MCV 93.5 93.1 93.6 92.5  PLT 102* 94* 101* 111*    Basic Metabolic Panel: Recent Labs  Lab 06/05/21 0953 06/06/21 0418 06/07/21 0419 06/08/21 0325  NA 129* 133* 136 132*  K 4.2 3.8 4.2 4.9  CL 94* 98 100 100  CO2 '28 28 28 26  ' GLUCOSE 103* 93 128* 125*  BUN '11 11 16 17  ' CREATININE 0.78 0.70 0.71 0.74  CALCIUM 9.6 9.2 9.9 9.4    GFR: Estimated Creatinine Clearance: 53.6 mL/min (by C-G formula based Vazquez SCr of 0.74 mg/dL).  Liver Function Tests: Recent Labs  Lab 06/05/21 0953 06/06/21 0418 06/07/21 0419 06/08/21 0325  AST 48* 47* 49* 75*  ALT '24 23 22  25  ' ALKPHOS 82 75 61 56  BILITOT 0.9 0.8 0.8 1.4*  PROT 6.2* 5.6* 5.2* 5.0*  ALBUMIN 4.0 3.4* 3.1* 3.0*    CBG: No results for input(s): GLUCAP in the last 168 hours.   Recent Results (from the past 240 hour(s))  Blood Culture (routine x 2)     Status: Abnormal (Preliminary result)   Collection Time: 06/05/21  9:48 AM   Specimen: BLOOD  Result Value Ref Range Status   Specimen Description  Final    BLOOD RIGHT ANTECUBITAL Performed at [facility] [address] [place], [place] [address]    Special Requests   Final    BOTTLES DRAWN AEROBIC AND ANAEROBIC Blood Culture results may not be optimal due to an inadequate volume of blood received in culture bottles Performed at [facility] [address] [place], [place] [address]    Culture  Setup Time   Final    GRAM POSITIVE COCCI IN CLUSTERS ANAEROBIC BOTTLE ONLY CRITICAL VALUE NOTED.  VALUE IS CONSISTENT WITH PREVIOUSLY REPORTED AND CALLED VALUE.    Culture (Marcha Licklider)  Final    Tammie EPIDERMIDIS CULTURE REINCUBATED FOR BETTER GROWTH Performed at [facility], [place] [address] [place], [place] [address]    Report Status PENDING  Incomplete  Blood Culture (routine x 2)     Status: Abnormal   Collection Time: 06/05/21 10:02 AM   Specimen: BLOOD  Result Value Ref Range Status   Specimen Description   Final    BLOOD LEFT ANTECUBITAL Performed at [facility] [address] [place], [place] [address]    Special Requests   Final    BOTTLES DRAWN AEROBIC AND ANAEROBIC Blood Culture results may not be optimal due to an inadequate volume of blood received in culture bottles Performed at [facility] [address] [place], [place] [address]    Culture  Setup Time   Final    IN BOTH AEROBIC AND ANAEROBIC BOTTLES GRAM POSITIVE COCCI IN CLUSTERS CRITICAL RESULT CALLED TO, READ BACK BY AND VERIFIED WITH: [name] Vazquez [phone] AT 0902  BY [name]    Culture (Rhesa Forsberg)  Final    Tammie EPIDERMIDIS Tammie HOMINIS THE SIGNIFICANCE OF ISOLATING THIS ORGANISM FROM [place]a [place] SINGLE SET OF BLOOD CULTURES WHEN MULTIPLE SETS ARE DRAWN IS UNCERTAIN. PLEASE NOTIFY THE MICROBIOLOGY DEPARTMENT WITHIN ONE WEEK IF SPECIATION AND SENSITIVITIES ARE REQUIRED. Performed at [facility], [place] [address], [place], [place] [address]    Report Status 06/08/2021 FINAL  Final   Organism ID, Bacteria Tammie EPIDERMIDIS  Final      Susceptibility   Tammie epidermidis - MIC*    CIPROFLOXACIN <=0.5 SENSITIVE Sensitive     ERYTHROMYCIN >=8 RESISTANT Resistant     GENTAMICIN <=0.5 SENSITIVE Sensitive     OXACILLIN <=0.25 SENSITIVE Sensitive     TETRACYCLINE >=16 RESISTANT Resistant     VANCOMYCIN 2 SENSITIVE Sensitive     TRIMETH/SULFA 80 RESISTANT Resistant     CLINDAMYCIN <=0.25 SENSITIVE Sensitive     RIFAMPIN <=0.5 SENSITIVE Sensitive     Inducible Clindamycin NEGATIVE Sensitive     * Tammie EPIDERMIDIS  Blood Culture ID Panel (Reflexed)     Status: Abnormal   Collection Time: 06/05/21 10:02 AM  Result Value Ref Range Status   Enterococcus faecalis NOT DETECTED NOT DETECTED Final   Enterococcus Faecium NOT DETECTED NOT DETECTED Final   Listeria monocytogenes NOT DETECTED NOT DETECTED Final   Tammie species DETECTED (Blanca Carreon) NOT DETECTED Final    Comment: CRITICAL RESULT CALLED TO, READ BACK BY AND VERIFIED WITH: [name] Vazquez [phone] AT 0902 BY [name]    Tammie aureus (BCID) NOT DETECTED NOT DETECTED Final   Tammie epidermidis DETECTED (Damariz Paganelli) NOT DETECTED Final    Comment: Methicillin (oxacillin) resistant coagulase negative Tammie. Possible blood culture contaminant (unless isolated from more than one blood culture draw or clinical case suggests pathogenicity). No antibiotic treatment is indicated for  blood  culture contaminants. CRITICAL RESULT CALLED TO, READ BACK BY AND  VERIFIED WITH: [name] Vazquez [phone] AT 0902 BY [name]    Tammie lugdunensis NOT DETECTED NOT DETECTED Final   Streptococcus species NOT DETECTED NOT DETECTED Final   Streptococcus agalactiae NOT DETECTED NOT DETECTED Final   Streptococcus pneumoniae NOT DETECTED NOT DETECTED Final   Streptococcus pyogenes NOT DETECTED NOT DETECTED Final   Telicia Hodgkiss.calcoaceticus-baumannii NOT DETECTED NOT DETECTED Final   Bacteroides fragilis NOT DETECTED NOT DETECTED Final   Enterobacterales NOT DETECTED NOT DETECTED Final   Enterobacter cloacae complex NOT DETECTED NOT DETECTED Final   Escherichia coli NOT DETECTED NOT DETECTED Final   Klebsiella aerogenes NOT DETECTED NOT DETECTED Final   Klebsiella oxytoca NOT DETECTED NOT DETECTED Final   Klebsiella pneumoniae NOT DETECTED NOT DETECTED Final   Proteus species NOT DETECTED NOT DETECTED Final   Salmonella species NOT DETECTED NOT DETECTED Final   Serratia marcescens NOT DETECTED NOT DETECTED Final   Haemophilus influenzae NOT DETECTED NOT DETECTED Final   Neisseria meningitidis NOT DETECTED NOT DETECTED Final   Pseudomonas aeruginosa NOT DETECTED NOT DETECTED Final   Stenotrophomonas maltophilia NOT DETECTED NOT DETECTED Final   Candida albicans NOT DETECTED NOT DETECTED Final   Candida auris NOT DETECTED NOT DETECTED Final   Candida glabrata NOT DETECTED NOT DETECTED Final   Candida krusei NOT DETECTED NOT DETECTED Final   Candida parapsilosis NOT DETECTED NOT DETECTED Final   Candida tropicalis NOT DETECTED NOT DETECTED Final   Cryptococcus neoformans/gattii NOT DETECTED NOT DETECTED Final   Methicillin resistance mecA/C DETECTED (Soul Deveney) NOT DETECTED Final    Comment: CRITICAL RESULT CALLED TO, READ BACK BY AND VERIFIED WITH: [name] Vazquez [phone] AT 0902 BY [name] Performed at [facility], [place] [address], [place], [place] [address]   Resp Panel by RT-PCR (Flu Janei Scheff&B, Covid) Nasopharyngeal Swab     Status: Abnormal    Collection Time: 06/05/21 10:14 AM   Specimen: Nasopharyngeal Swab; Nasopharyngeal(NP) swabs in vial transport medium  Result Value Ref Range Status   SARS Coronavirus 2 by RT PCR POSITIVE ([name]) NEGATIVE Final    Comment: RESULT CALLED TO, READ BACK BY AND VERIFIED WITH: [name] 06/05/2021 @ 1200 BY MECIAL J.  (NOTE) SARS-CoV-2 target nucleic acids are DETECTED.  The SARS-CoV-2 RNA is generally detectable in upper respiratory specimens during the acute phase of infection. Positive results are indicative of the presence of the identified virus, but do not rule out bacterial infection or co-infection with other pathogens not detected by the test. Clinical correlation with patient history and other diagnostic information is necessary to determine patient infection status. The expected result is Negative.  Fact Sheet for Patients: EntrepreneurPulse.com.au  Fact Sheet for Healthcare Providers: IncredibleEmployment.be  This test is not yet approved or cleared by the [place] FDA and  has been authorized for detection and/or diagnosis of SARS-CoV-2 by FDA under an Emergency Use Authorization (EUA).  This EUA will remain in effect (meaning this  test can be used) for the duration of  the COVID-19 declaration under Section 564(b)(1) of the Act, 21 U.S.C. section 360bbb-3(b)(1), unless the authorization is terminated or revoked sooner.     [name] by PCR NEGATIVE NEGATIVE Final   Tammie B by PCR NEGATIVE NEGATIVE Final    Comment: (NOTE) The Xpert Xpress SARS-CoV-2/FLU/RSV plus assay is intended as an aid in the diagnosis of Tammie from Nasopharyngeal swab specimens and should not be used as  Nahzir Pohle sole basis for treatment. Nasal washings and aspirates are unacceptable for Xpert Xpress SARS-CoV-2/FLU/RSV testing.  Fact Sheet for Patients: EntrepreneurPulse.com.au  Fact Sheet for Healthcare  Providers: IncredibleEmployment.be  This test is not yet approved or cleared by the [place] FDA and has been authorized for detection and/or diagnosis of SARS-CoV-2 by FDA under an Emergency Use Authorization (EUA). This EUA will remain in effect (meaning this test can be used) for the duration of the COVID-19 declaration under Section 564(b)(1) of the Act, 21 U.S.C. section 360bbb-3(b)(1), unless the authorization is terminated or revoked.  Performed at [facility], [place] [address], [place], [place] [address]   Culture, blood (routine x 2)     Status: None (Preliminary result)   Collection Time: 06/07/21  8:38 AM   Specimen: BLOOD  Result Value Ref Range Status   Specimen Description   Final    BLOOD BLOOD RIGHT HAND Performed at [facility] [address], [place], [place] [address]    Special Requests   Final    BOTTLES DRAWN AEROBIC ONLY Blood Culture results may not be optimal due to an inadequate volume of blood received in culture bottles Performed at [facility] [address] [place], [place] [address]    Culture   Final    NO GROWTH < 24 HOURS Performed at [facility] [address] [place], [place] [address]    Report Status PENDING  Incomplete  Culture, blood (routine x 2)     Status: None (Preliminary result)   Collection Time: 06/07/21  8:38 AM   Specimen: BLOOD  Result Value Ref Range Status   Specimen Description   Final    BLOOD LEFT ANTECUBITAL Performed at [facility] [address] [place], [place] [address]    Special Requests   Final    BOTTLES DRAWN AEROBIC ONLY Blood Culture adequate volume Performed at [facility] [address], [place], [place] [address]    Culture   Final    NO GROWTH < 24 HOURS Performed at [facility] [address] [place], [place] [address]    Report Status PENDING  Incomplete          Radiology Studies: ECHOCARDIOGRAM COMPLETE  Result Date: 06/08/2021    ECHOCARDIOGRAM REPORT   Patient Name:   CHARLA CRISCIONE Date of Exam: 06/08/2021 Medical Rec #:  [id number]          Height:       61.0 in Accession #:    [id number]         Weight:       145.0 lb Date of Birth:  04/18/1946           BSA:          1.647 m Patient Age:    12 years           BP:           140/82 mmHg Patient Gender: F                  HR:           51 bpm. Exam Location:  Inpatient Procedure: 2D Echo, Cardiac Doppler and Color Doppler Indications:    Bacteremia R78.81  History:        Patient has prior history of Echocardiogram examinations, most  recent 10/11/2011. Signs/Symptoms:Murmur; Risk                 Factors:Hypertension and Dyslipidemia. COVID 19. Thyroid                 disease. GERD.  Sonographer:    Darlina Sicilian RDCS Referring Phys: [phone] Marketta Valadez CALDWELL POWELL [place]  1. Left ventricular ejection fraction, by estimation, is 60 to 65%. The left ventricle has normal function. The left ventricle has no regional wall motion abnormalities. There is mild left ventricular hypertrophy. Left ventricular diastolic parameters are consistent with Grade II diastolic dysfunction (pseudonormalization). Elevated left atrial pressure.  2. Right ventricular systolic function is normal. The right ventricular size is normal. Tricuspid regurgitation signal is inadequate for assessing PA pressure.  3. Left atrial size was severely dilated.  4. The mitral valve is normal in structure. No evidence of mitral valve regurgitation. No evidence of mitral stenosis.  5. The aortic valve is tricuspid. Aortic valve regurgitation is not visualized. No aortic stenosis is present.  6. The inferior vena cava is normal in size with greater than 50% respiratory variability, suggesting right atrial pressure of 3 mmHg. Conclusion(s)/Recommendation(s): No vegetation seen. If high clinical suspicion for endocarditis,  consider TEE. FINDINGS  Left Ventricle: Left ventricular ejection fraction, by estimation, is 60 to 65%. The left ventricle has normal function. The left ventricle has no regional wall motion abnormalities. The left ventricular internal cavity size was normal in size. There is  mild left ventricular hypertrophy. Left ventricular diastolic parameters are consistent with Grade II diastolic dysfunction (pseudonormalization). Elevated left atrial pressure. Right Ventricle: The right ventricular size is normal. No increase in right ventricular wall thickness. Right ventricular systolic function is normal. Tricuspid regurgitation signal is inadequate for assessing PA pressure. Left Atrium: Left atrial size was severely dilated. Right Atrium: Right atrial size was normal in size. Pericardium: Trivial pericardial effusion is present. Mitral Valve: The mitral valve is normal in structure. No evidence of mitral valve regurgitation. No evidence of mitral valve stenosis. Tricuspid Valve: The tricuspid valve is not well visualized. Tricuspid valve regurgitation is not demonstrated. Aortic Valve: The aortic valve is tricuspid. Aortic valve regurgitation is not visualized. No aortic stenosis is present. Pulmonic Valve: The pulmonic valve was not well visualized. Pulmonic valve regurgitation is trivial. Aorta: The aortic root and ascending aorta are structurally normal, with no evidence of dilitation. Venous: The inferior vena cava is normal in size with greater than 50% respiratory variability, suggesting right atrial pressure of 3 mmHg. IAS/Shunts: The interatrial septum was not well visualized.  LEFT VENTRICLE PLAX 2D LVIDd:         4.80 cm  Diastology LVIDs:         2.90 cm  LV e' medial:    3.91 cm/s LV PW:         1.00 cm  LV E/e' medial:  19.8 LV IVS:        1.00 cm  LV e' lateral:   4.54 cm/s LVOT diam:     1.90 cm  LV E/e' lateral: 17.1 LV SV:         64 LV SV Index:   39 LVOT Area:     2.84 cm  RIGHT VENTRICLE RV S  prime:     12.20 cm/s TAPSE (M-mode): 2.0 cm LEFT ATRIUM              Index       RIGHT ATRIUM  Index LA diam:        3.50 cm  2.12 cm/m  RA Area:     9.75 cm LA Vol (A2C):   68.9 ml  41.82 ml/m RA Volume:   16.10 ml 9.77 ml/m LA Vol (A4C):   112.0 ml 67.98 ml/m LA Biplane Vol: 93.3 ml  56.63 ml/m  AORTIC VALVE LVOT Vmax:   126.00 cm/s LVOT Vmean:  70.300 cm/s LVOT VTI:    0.226 m  AORTA Ao Root diam: 2.80 cm Ao Asc diam:  2.80 cm MITRAL VALVE MV Area (PHT): 4.49 cm    SHUNTS MV Decel Time: 169 msec    Systemic VTI:  0.23 m MV E velocity: 77.50 cm/s  Systemic Diam: 1.90 cm MV Ahyan Kreeger velocity: 42.70 cm/s MV E/Riko Lumsden ratio:  1.81 Oswaldo Milian MD Electronically signed by Oswaldo Milian MD Signature Date/Time: 06/08/2021/2:53:18 PM    Final         Scheduled Meds:  vitamin C  1,000 mg Oral Daily   aspirin  81 mg Oral Daily   cholecalciferol  5,000 Units Oral Once per day Vazquez Tue Thu Sat   dexamethasone  6 mg Oral Daily   enoxaparin (LOVENOX) injection  40 mg Subcutaneous D66Y   folic acid  3 mg Oral Daily   gabapentin  900 mg Oral QHS   guaiFENesin  1,200 mg Oral BID   levothyroxine  137 mcg Oral QAC breakfast   traZODone  150 mg Oral QHS   zinc sulfate  220 mg Oral Daily   Continuous Infusions:  remdesivir 100 mg in NS 100 mL 100 mg (06/08/21 0921)   vancomycin Stopped (06/07/21 2257)     LOS: 2 days    Time spent: over 30 min    Fayrene Helper, MD [facility]   To contact the attending provider between 7A-7P or the covering provider during after hours 7P-7A, please log into the web site [email or web address] and access using universal [place] password for that web site. If you do not have the password, please call the hospital operator.  06/08/2021, 3:28 PM

## 2021-06-08 NOTE — Progress Notes (Signed)
  Echocardiogram 2D Echocardiogram has been performed.  Darlina Sicilian M 06/08/2021, 1:39 PM

## 2021-06-08 NOTE — Progress Notes (Signed)
Pt reported that her feet felt like they were "burning." Pt requesting Gabapentin. Notified MD. Stated it was okay to give night dose early. Will continue to monitor for changes.

## 2021-06-09 LAB — COMPREHENSIVE METABOLIC PANEL
ALT: 36 U/L (ref 0–44)
AST: 79 U/L — ABNORMAL HIGH (ref 15–41)
Albumin: 3.3 g/dL — ABNORMAL LOW (ref 3.5–5.0)
Alkaline Phosphatase: 51 U/L (ref 38–126)
Anion gap: 7 (ref 5–15)
BUN: 16 mg/dL (ref 8–23)
CO2: 30 mmol/L (ref 22–32)
Calcium: 10.1 mg/dL (ref 8.9–10.3)
Chloride: 100 mmol/L (ref 98–111)
Creatinine, Ser: 0.75 mg/dL (ref 0.44–1.00)
GFR, Estimated: >60 mL/min (ref >60)
Glucose, Bld: 120 mg/dL — ABNORMAL HIGH (ref 70–99)
Potassium: 4 mmol/L (ref 3.5–5.1)
Sodium: 137 mmol/L (ref 135–145)
Total Bilirubin: 0.9 mg/dL (ref 0.3–1.2)
Total Protein: 5.4 g/dL — ABNORMAL LOW (ref 6.5–8.1)

## 2021-06-09 LAB — CBC WITH DIFFERENTIAL/PLATELET
Abs Immature Granulocytes: 0.03 10*3/uL (ref 0.00–0.07)
Basophils Absolute: 0 10*3/uL (ref 0.0–0.1)
Basophils Relative: 0 %
Eosinophils Absolute: 0 10*3/uL (ref 0.0–0.5)
Eosinophils Relative: 0 %
HCT: 38 % (ref 36.0–46.0)
Hemoglobin: 12.8 g/dL (ref 12.0–15.0)
Immature Granulocytes: 0 %
Lymphocytes Relative: 9 %
Lymphs Abs: 0.6 10*3/uL — ABNORMAL LOW (ref 0.7–4.0)
MCH: 31.1 pg (ref 26.0–34.0)
MCHC: 33.7 g/dL (ref 30.0–36.0)
MCV: 92.2 fL (ref 80.0–100.0)
Monocytes Absolute: 0.7 10*3/uL (ref 0.1–1.0)
Monocytes Relative: 9 %
Neutro Abs: 6.1 10*3/uL (ref 1.7–7.7)
Neutrophils Relative %: 82 %
Platelets: 137 10*3/uL — ABNORMAL LOW (ref 150–400)
RBC: 4.12 MIL/uL (ref 3.87–5.11)
RDW: 14.3 % (ref 11.5–15.5)
WBC: 7.4 10*3/uL (ref 4.0–10.5)
nRBC: 0 % (ref 0.0–0.2)

## 2021-06-09 LAB — D-DIMER, QUANTITATIVE: D-Dimer, Quant: 0.43 ug/mL-FEU (ref 0.00–0.50)

## 2021-06-09 LAB — C-REACTIVE PROTEIN: CRP: 2.6 mg/dL — ABNORMAL HIGH (ref <1.0)

## 2021-06-09 NOTE — Progress Notes (Signed)
PROGRESS NOTE    [name]  [id number] DOB: Nov 08, 1946 DOA: 06/05/2021 PCP: [name], MD   Chief Complaint  Patient presents with   Shortness of Breath    Covid positive     Brief Narrative:  [name] is [name] 75 y.o. female with history of rheumatoid arthritis on methotrexate and Plaquenil, hypertension, hypothyroidism presents to the ER because of weakness.  She was admitted with covid 19 infection and was noted to have staph epi bacteremia.  ID has been consulted and currently awaiting sensitivities.  Hopefully she'll be able to discharge within next 24 hrs pending those results.  Assessment & Plan:   Principal Problem:   COVID-19 Active Problems:   Essential hypertension   Rheumatoid arthritis ([place])   Hyperlipidemia, mixed   Hypothyroidism   S/P shoulder replacement, right   Hyponatremia   COVID-19 virus infection   Staphylococcus epidermidis bacteremia  COVID-19 infection - Weaned to RA - fever to 100.8 on 7/30 - CXR without focal airspace disease - CRP peaked at 15.1, improving  - Remdesivir 7/29-8/2 (5 days complete) - Solumedrol 7/30-present, plan for taper with improvement - strict I/O, daily weights - inflammatory labs  - OOB, IS, flutter  COVID-19 Labs  Recent Labs    06/07/21 0419 06/08/21 0325 06/09/21 0332  DDIMER 0.62* 0.45 0.43  CRP 11.7* 5.6* 2.6*    Lab Results  Component Value Date   SARSCOV2NAA POSITIVE (Ladrea Holladay) 06/05/2021    MRSE bacteremia  Staph Hominis Bacteremia  Coag Negative Bacteremia -staph epi in 3/4 bottles, staph hominis in 1/2 sets follow sensitivities and additional ID recommendations -continue vancomycin -repeat blood cultures (no growth to date) -follow TTE (echo with 60-65%, grade II diastolic dysfunction - no vegetation seen - see report) -ID c/s, appreciate recs   Hyponatremia -improved    Hypothyroidism -Continue Synthroid   Hypertension -Blood pressure is appropriate today -Continue  irbesartan; hydrochlorothiazide on hold due to hyponatremia   History of rheumatoid arthritis -Continue Plaquenil (on hold with remdesivir), methotrexate   Thrombocytopenia -relatively stable, likely 2/2 acute illness   DVT prophylaxis: lovenox Code Status: full  Family Communication: none at bedside Disposition:   Status is: Inpatient  Remains inpatient appropriate because:Inpatient level of care appropriate due to severity of illness  Dispo: The patient is from: Home              Anticipated d/c is to: Home              Patient currently is not medically stable to d/c.   Difficult to place patient No       Consultants:  ID  Procedures:  Echo IMPRESSIONS     1. Left ventricular ejection fraction, by estimation, is 60 to 65%. The  left ventricle has normal function. The left ventricle has no regional  wall motion abnormalities. There is mild left ventricular hypertrophy.  Left ventricular diastolic parameters  are consistent with Grade II diastolic dysfunction (pseudonormalization).  Elevated left atrial pressure.   2. Right ventricular systolic function is normal. The right ventricular  size is normal. Tricuspid regurgitation signal is inadequate for assessing  PA pressure.   3. Left atrial size was severely dilated.   4. The mitral valve is normal in structure. No evidence of mitral valve  regurgitation. No evidence of mitral stenosis.   5. The aortic valve is tricuspid. Aortic valve regurgitation is not  visualized. No aortic stenosis is present.   6. The inferior vena cava is normal  in size with greater than 50%  respiratory variability, suggesting right atrial pressure of 3 mmHg.   Conclusion(s)/Recommendation(s): No vegetation seen. If high clinical  suspicion for endocarditis, consider TEE.   Antimicrobials:  Anti-infectives (From admission, onward)    Start     Dose/Rate Route Frequency Ordered Stop   06/07/21 2200  vancomycin (VANCOREADY) IVPB 1250  mg/250 mL        1,250 mg 166.7 mL/hr over 90 Minutes Intravenous Every 36 hours 06/06/21 1034     06/06/21 1030  vancomycin (VANCOCIN) IVPB 1000 mg/200 mL premix        1,000 mg 200 mL/hr over 60 Minutes Intravenous  Once 06/06/21 1015 06/06/21 1449   06/06/21 1000  remdesivir 100 mg in sodium chloride 0.9 % 100 mL IVPB       See Hyperspace for full Linked Orders Report.   100 mg 200 mL/hr over 30 Minutes Intravenous Daily 06/05/21 1211 06/09/21 1034   06/05/21 2200  hydroxychloroquine (PLAQUENIL) tablet 200 mg  Status:  Discontinued        200 mg Oral 2 times daily 06/05/21 1622 06/05/21 1850   06/05/21 2000  amoxicillin (AMOXIL) capsule 500 mg  Status:  Discontinued        500 mg Oral Every 8 hours 06/05/21 1624 06/06/21 1422   06/05/21 1330  remdesivir 200 mg in sodium chloride 0.9% 250 mL IVPB       See Hyperspace for full Linked Orders Report.   200 mg 580 mL/hr over 30 Minutes Intravenous Once 06/05/21 1211 06/05/21 1851          Subjective: No new complaints  Objective: Vitals:   06/08/21 1240 06/08/21 2052 06/09/21 0604 06/09/21 1252  BP: 140/82 (!) 150/81 (!) 145/73 (!) 141/74  Pulse: (!) 51 (!) 51 (!) 52 (!) 53  Resp: '16 20  16  '$ Temp: 97.7 F (36.5 C) 98.7 F (37.1 C) 98.5 F (36.9 C) 97.7 F (36.5 C)  TempSrc: Oral Oral Oral Oral  SpO2: 94% 95% 95% 96%  Weight:      Height:        Intake/Output Summary (Last 24 hours) at 06/09/2021 1421 Last data filed at 06/09/2021 1300 Gross per 24 hour  Intake 220 ml  Output --  Net 220 ml   Filed Weights   06/05/21 0939  Weight: 65.8 kg    Examination:  General: No acute distress. Cardiovascular:RRR Lungs: unlabored Abdomen: Soft, nontender, nondistended  Neurological: Alert and oriented 3. Moves all extremities 4 . Cranial nerves II through XII grossly intact. Skin: Warm and dry. No rashes or lesions. Extremities: No clubbing or cyanosis. No edema.    Data Reviewed: I have personally reviewed following  labs and imaging studies  CBC: Recent Labs  Lab 06/05/21 0953 06/06/21 0418 06/07/21 0419 06/08/21 0325 06/09/21 0332  WBC 10.5 7.8 4.3 5.8 7.4  NEUTROABS 9.7* 6.7 3.5 4.8 6.1  HGB 12.6 11.9* 12.0 11.7* 12.8  HCT 37.4 35.3* 36.3 34.7* 38.0  MCV 93.5 93.1 93.6 92.5 92.2  PLT 102* 94* 101* 111* 137*    Basic Metabolic Panel: Recent Labs  Lab 06/05/21 0953 06/06/21 0418 06/07/21 0419 06/08/21 0325 06/09/21 0332  NA 129* 133* 136 132* 137  K 4.2 3.8 4.2 4.9 4.0  CL 94* 98 100 100 100  CO2 '28 28 28 26 30  '$ GLUCOSE 103* 93 128* 125* 120*  BUN '11 11 16 17 16  '$ CREATININE 0.78 0.70 0.71 0.74 0.75  CALCIUM 9.6 9.2  9.9 9.4 10.1    GFR: Estimated Creatinine Clearance: 53.6 mL/min (by C-G formula based on SCr of 0.75 mg/dL).  Liver Function Tests: Recent Labs  Lab 06/05/21 0953 06/06/21 0418 06/07/21 0419 06/08/21 0325 06/09/21 0332  AST 48* 47* 49* 75* 79*  ALT '24 23 22 25 '$ 36  ALKPHOS 82 75 61 56 51  BILITOT 0.9 0.8 0.8 1.4* 0.9  PROT 6.2* 5.6* 5.2* 5.0* 5.4*  ALBUMIN 4.0 3.4* 3.1* 3.0* 3.3*    CBG: No results for input(s): GLUCAP in the last 168 hours.   Recent Results (from the past 240 hour(s))  Blood Culture (routine x 2)     Status: Abnormal (Preliminary result)   Collection Time: 06/05/21  9:48 AM   Specimen: BLOOD  Result Value Ref Range Status   Specimen Description   Final    BLOOD RIGHT ANTECUBITAL Performed at [facility] [address] [place], [place] [address]    Special Requests   Final    BOTTLES DRAWN AEROBIC AND ANAEROBIC Blood Culture results may not be optimal due to an inadequate volume of blood received in culture bottles Performed at [facility] [address] [place], [place] [address]    Culture  Setup Time   Final    GRAM POSITIVE COCCI IN CLUSTERS ANAEROBIC BOTTLE ONLY CRITICAL VALUE NOTED.  VALUE IS CONSISTENT WITH PREVIOUSLY REPORTED AND CALLED VALUE.    Culture (Kahliyah Dick)  Final     STAPHYLOCOCCUS EPIDERMIDIS SUSCEPTIBILITIES TO FOLLOW Performed at [facility], [place] [address] [place], [place] [address]    Report Status PENDING  Incomplete  Blood Culture (routine x 2)     Status: Abnormal   Collection Time: 06/05/21 10:02 AM   Specimen: BLOOD  Result Value Ref Range Status   Specimen Description   Final    BLOOD LEFT ANTECUBITAL Performed at [facility] [address] [place], [place] [address]    Special Requests   Final    BOTTLES DRAWN AEROBIC AND ANAEROBIC Blood Culture results may not be optimal due to an inadequate volume of blood received in culture bottles Performed at [facility] [address], [place], [place] [address]    Culture  Setup Time   Final    IN BOTH AEROBIC AND ANAEROBIC BOTTLES GRAM POSITIVE COCCI IN CLUSTERS CRITICAL RESULT CALLED TO, READ BACK BY AND VERIFIED WITH: PHARM D C.SHADE ON [id number] AT 0902 BY E.PARRISH    Culture (Zaccheus Edmister)  Final    STAPHYLOCOCCUS EPIDERMIDIS STAPHYLOCOCCUS HOMINIS THE SIGNIFICANCE OF ISOLATING THIS ORGANISM FROM Keatyn Jawad SINGLE SET OF BLOOD CULTURES WHEN MULTIPLE SETS ARE DRAWN IS UNCERTAIN. PLEASE NOTIFY THE MICROBIOLOGY DEPARTMENT WITHIN ONE WEEK IF SPECIATION AND SENSITIVITIES ARE REQUIRED. Performed at [facility], [place] [address], [place], [place] [address]    Report Status 06/08/2021 FINAL  Final   Organism ID, Bacteria STAPHYLOCOCCUS EPIDERMIDIS  Final      Susceptibility   Staphylococcus epidermidis - MIC*    CIPROFLOXACIN <=0.5 SENSITIVE Sensitive     ERYTHROMYCIN >=8 RESISTANT Resistant     GENTAMICIN <=0.5 SENSITIVE Sensitive     OXACILLIN <=0.25 SENSITIVE Sensitive     TETRACYCLINE >=16 RESISTANT Resistant     VANCOMYCIN 2 SENSITIVE Sensitive     TRIMETH/SULFA 80 RESISTANT Resistant     CLINDAMYCIN <=0.25 SENSITIVE Sensitive     RIFAMPIN <=0.5 SENSITIVE Sensitive     Inducible Clindamycin NEGATIVE Sensitive     *  STAPHYLOCOCCUS EPIDERMIDIS   Blood Culture ID Panel (Reflexed)     Status: Abnormal   Collection Time: 06/05/21 10:02 AM  Result Value Ref Range Status   Enterococcus faecalis NOT DETECTED NOT DETECTED Final   Enterococcus Faecium NOT DETECTED NOT DETECTED Final   Listeria monocytogenes NOT DETECTED NOT DETECTED Final   Staphylococcus species DETECTED (Arneta Mahmood) NOT DETECTED Final    Comment: CRITICAL RESULT CALLED TO, READ BACK BY AND VERIFIED WITH: PHARM D C.SHADE ON [id number] AT 0902 BY E.PARRISH    Staphylococcus aureus (BCID) NOT DETECTED NOT DETECTED Final   Staphylococcus epidermidis DETECTED (Chieko Neises) NOT DETECTED Final    Comment: Methicillin (oxacillin) resistant coagulase negative staphylococcus. Possible blood culture contaminant (unless isolated from more than one blood culture draw or clinical case suggests pathogenicity). No antibiotic treatment is indicated for blood  culture contaminants. CRITICAL RESULT CALLED TO, READ BACK BY AND VERIFIED WITH: PHARM D C.SHADE ON [id number] AT 0902 BY E.PARRISH    Staphylococcus lugdunensis NOT DETECTED NOT DETECTED Final   Streptococcus species NOT DETECTED NOT DETECTED Final   Streptococcus agalactiae NOT DETECTED NOT DETECTED Final   Streptococcus pneumoniae NOT DETECTED NOT DETECTED Final   Streptococcus pyogenes NOT DETECTED NOT DETECTED Final   Haifa Hatton.calcoaceticus-baumannii NOT DETECTED NOT DETECTED Final   Bacteroides fragilis NOT DETECTED NOT DETECTED Final   Enterobacterales NOT DETECTED NOT DETECTED Final   Enterobacter cloacae complex NOT DETECTED NOT DETECTED Final   Escherichia coli NOT DETECTED NOT DETECTED Final   Klebsiella aerogenes NOT DETECTED NOT DETECTED Final   Klebsiella oxytoca NOT DETECTED NOT DETECTED Final   Klebsiella pneumoniae NOT DETECTED NOT DETECTED Final   Proteus species NOT DETECTED NOT DETECTED Final   Salmonella species NOT DETECTED NOT DETECTED Final   Serratia marcescens NOT DETECTED NOT DETECTED Final   Haemophilus influenzae NOT  DETECTED NOT DETECTED Final   Neisseria meningitidis NOT DETECTED NOT DETECTED Final   Pseudomonas aeruginosa NOT DETECTED NOT DETECTED Final   Stenotrophomonas maltophilia NOT DETECTED NOT DETECTED Final   Candida albicans NOT DETECTED NOT DETECTED Final   Candida auris NOT DETECTED NOT DETECTED Final   Candida glabrata NOT DETECTED NOT DETECTED Final   Candida krusei NOT DETECTED NOT DETECTED Final   Candida parapsilosis NOT DETECTED NOT DETECTED Final   Candida tropicalis NOT DETECTED NOT DETECTED Final   Cryptococcus neoformans/gattii NOT DETECTED NOT DETECTED Final   Methicillin resistance mecA/C DETECTED (Abdoulaye Drum) NOT DETECTED Final    Comment: CRITICAL RESULT CALLED TO, READ BACK BY AND VERIFIED WITH: PHARM D C.SHADE ON [id number] AT 0902 BY E.PARRISH Performed at [facility], [place] [address], [place], [place] [address]   Resp Panel by RT-PCR (Flu Jahden Schara&B, Covid) Nasopharyngeal Swab     Status: Abnormal   Collection Time: 06/05/21 10:14 AM   Specimen: Nasopharyngeal Swab; Nasopharyngeal(NP) swabs in vial transport medium  Result Value Ref Range Status   SARS Coronavirus 2 by RT PCR POSITIVE (Alletta Mattos) NEGATIVE Final    Comment: RESULT CALLED TO, READ BACK BY AND VERIFIED WITH: BENNET L. ON 06/05/2021 @ 1200 BY MECIAL J.  (NOTE) SARS-CoV-2 target nucleic acids are DETECTED.  The SARS-CoV-2 RNA is generally detectable in upper respiratory specimens during the acute phase of infection. Positive results are indicative of the presence of the identified virus, but do not rule out bacterial infection or co-infection with other pathogens not detected by the test. Clinical correlation with patient history and other diagnostic information is necessary to determine patient infection status. The expected  result is Negative.  Fact Sheet for Patients: EntrepreneurPulse.com.au  Fact Sheet for Healthcare Providers: IncredibleEmployment.be  This test is not yet  approved or cleared by the [place] FDA and  has been authorized for detection and/or diagnosis of SARS-CoV-2 by FDA under an Emergency Use Authorization (EUA).  This EUA will remain in effect (meaning this  test can be used) for the duration of  the COVID-19 declaration under Section 564(b)(1) of the Act, 21 U.S.C. section 360bbb-3(b)(1), unless the authorization is terminated or revoked sooner.     Influenza Dorota Heinrichs by PCR NEGATIVE NEGATIVE Final   Influenza B by PCR NEGATIVE NEGATIVE Final    Comment: (NOTE) The Xpert Xpress SARS-CoV-2/FLU/RSV plus assay is intended as an aid in the diagnosis of influenza from Nasopharyngeal swab specimens and should not be used as Theodus Ran sole basis for treatment. Nasal washings and aspirates are unacceptable for Xpert Xpress SARS-CoV-2/FLU/RSV testing.  Fact Sheet for Patients: EntrepreneurPulse.com.au  Fact Sheet for Healthcare Providers: IncredibleEmployment.be  This test is not yet approved or cleared by the [place] FDA and has been authorized for detection and/or diagnosis of SARS-CoV-2 by FDA under an Emergency Use Authorization (EUA). This EUA will remain in effect (meaning this test can be used) for the duration of the COVID-19 declaration under Section 564(b)(1) of the Act, 21 U.S.C. section 360bbb-3(b)(1), unless the authorization is terminated or revoked.  Performed at [facility], [place] [address], [place], [place] [address]   Culture, blood (routine x 2)     Status: None (Preliminary result)   Collection Time: 06/07/21  8:38 AM   Specimen: BLOOD  Result Value Ref Range Status   Specimen Description   Final    BLOOD BLOOD RIGHT HAND Performed at [facility] [address], [place], [place] [address]    Special Requests   Final    BOTTLES DRAWN AEROBIC ONLY Blood Culture results may not be optimal due to an inadequate volume of blood received in culture  bottles Performed at [facility] [address] [place], [place] [address]    Culture   Final    NO GROWTH 2 DAYS Performed at [facility] [address], [place], [place] [address]    Report Status PENDING  Incomplete  Culture, blood (routine x 2)     Status: None (Preliminary result)   Collection Time: 06/07/21  8:38 AM   Specimen: BLOOD  Result Value Ref Range Status   Specimen Description   Final    BLOOD LEFT ANTECUBITAL Performed at [facility] [address] [place], [place] [address]    Special Requests   Final    BOTTLES DRAWN AEROBIC ONLY Blood Culture adequate volume Performed at [facility] [address] [place], [place] [address]    Culture   Final    NO GROWTH 2 DAYS Performed at [facility] [address] [place], [place] [address]    Report Status PENDING  Incomplete         Radiology Studies: ECHOCARDIOGRAM COMPLETE  Result Date: 06/08/2021    ECHOCARDIOGRAM REPORT   Patient Name:   SAMARI ENTLER Date of Exam: 06/08/2021 Medical Rec #:  [id number]          Height:       61.0 in Accession #:    [id number]         Weight:       145.0 lb Date  of Birth:  Nov 18, 1945           BSA:          1.647 m Patient Age:    74 years           BP:           140/82 mmHg Patient Gender: F                  HR:           51 bpm. Exam Location:  Inpatient Procedure: 2D Echo, Cardiac Doppler and Color Doppler Indications:    Bacteremia R78.81  History:        Patient has prior history of Echocardiogram examinations, most                 recent 10/11/2011. Signs/Symptoms:Murmur; Risk                 Factors:Hypertension and Dyslipidemia. COVID 19. Thyroid                 disease. GERD.  Sonographer:    Darlina Sicilian RDCS Referring Phys: [phone] Keshonda Monsour [facility]DWELL POW[facility]  1. Left ventricular ejection fraction, by estimation, is 60 to 65%. The left ventricle has normal function. The left ventricle has  no regional wall motion abnormalities. There is mild left ventricular hypertrophy. Left ventricular diastolic parameters are consistent with Grade II diastolic dysfunction (pseudonormalization). Elevated left atrial pressure.  2. Right ventricular systolic function is normal. The right ventricular size is normal. Tricuspid regurgitation signal is inadequate for assessing PA pressure.  3. Left atrial size was severely dilated.  4. The mitral valve is normal in structure. No evidence of mitral valve regurgitation. No evidence of mitral stenosis.  5. The aortic valve is tricuspid. Aortic valve regurgitation is not visualized. No aortic stenosis is present.  6. The inferior vena cava is normal in size with greater than 50% respiratory variability, suggesting right atrial pressure of 3 mmHg. Conclusion(s)/Recommendation(s): No vegetation seen. If high clinical suspicion for endocarditis, consider TEE. FINDINGS  Left Ventricle: Left ventricular ejection fraction, by estimation, is 60 to 65%. The left ventricle has normal function. The left ventricle has no regional wall motion abnormalities. The left ventricular internal cavity size was normal in size. There is  mild left ventricular hypertrophy. Left ventricular diastolic parameters are consistent with Grade II diastolic dysfunction (pseudonormalization). Elevated left atrial pressure. Right Ventricle: The right ventricular size is normal. No increase in right ventricular wall thickness. Right ventricular systolic function is normal. Tricuspid regurgitation signal is inadequate for assessing PA pressure. Left Atrium: Left atrial size was severely dilated. Right Atrium: Right atrial size was normal in size. Pericardium: Trivial pericardial effusion is present. Mitral Valve: The mitral valve is normal in structure. No evidence of mitral valve regurgitation. No evidence of mitral valve stenosis. Tricuspid Valve: The tricuspid valve is not well visualized. Tricuspid valve  regurgitation is not demonstrated. Aortic Valve: The aortic valve is tricuspid. Aortic valve regurgitation is not visualized. No aortic stenosis is present. Pulmonic Valve: The pulmonic valve was not well visualized. Pulmonic valve regurgitation is trivial. Aorta: The aortic root and ascending aorta are structurally normal, with no evidence of dilitation. Venous: The inferior vena cava is normal in size with greater than 50% respiratory variability, suggesting right atrial pressure of 3 mmHg. IAS/Shunts: The interatrial septum was not well visualized.  LEFT VENTRICLE PLAX 2D LVIDd:         4.80 cm  Diastology LVIDs:         2.90 cm  LV e' medial:    3.91 cm/s LV PW:         1.00 cm  LV E/e' medial:  19.8 LV IVS:        1.00 cm  LV e' lateral:   4.54 cm/s LVOT diam:     1.90 cm  LV E/e' lateral: 17.1 LV SV:         64 LV SV Index:   39 LVOT Area:     2.84 cm  RIGHT VENTRICLE RV S prime:     12.20 cm/s TAPSE (M-mode): 2.0 cm LEFT ATRIUM              Index       RIGHT ATRIUM          Index LA diam:        3.50 cm  2.12 cm/m  RA Area:     9.75 cm LA Vol (A2C):   68.9 ml  41.82 ml/m RA Volume:   16.10 ml 9.77 ml/m LA Vol (A4C):   112.0 ml 67.98 ml/m LA Biplane Vol: 93.3 ml  56.63 ml/m  AORTIC VALVE LVOT Vmax:   126.00 cm/s LVOT Vmean:  70.300 cm/s LVOT VTI:    0.226 m  AORTA Ao Root diam: 2.80 cm Ao Asc diam:  2.80 cm MITRAL VALVE MV Area (PHT): 4.49 cm    SHUNTS MV Decel Time: 169 msec    Systemic VTI:  0.23 m MV E velocity: 77.50 cm/s  Systemic Diam: 1.90 cm MV Kalmen Lollar velocity: 42.70 cm/s MV E/Abdirahman Chittum ratio:  1.81 Oswaldo Milian MD Electronically signed by Oswaldo Milian MD Signature Date/Time: 06/08/2021/2:53:18 PM    Final         Scheduled Meds:  vitamin C  1,000 mg Oral Daily   aspirin  81 mg Oral Daily   cholecalciferol  5,000 Units Oral Once per day on Tue Thu Sat   enoxaparin (LOVENOX) injection  40 mg Subcutaneous [id number]   folic acid  3 mg Oral Daily   gabapentin  900 mg Oral QHS   guaiFENesin   1,200 mg Oral BID   levothyroxine  137 mcg Oral QAC breakfast   traZODone  150 mg Oral QHS   zinc sulfate  220 mg Oral Daily   Continuous Infusions:  vancomycin 1,250 mg (06/09/21 1052)     LOS: 3 days    Time spent: over 30 min    Fayrene Helper, MD [facility]   To contact the attending provider between 7A-7P or the covering provider during after hours 7P-7A, please log into the web site [email or web address] and access using universal [place] password for that web site. If you do not have the password, please call the hospital operator.  06/09/2021, 2:21 PM

## 2021-06-09 NOTE — Progress Notes (Signed)
Pharmacy Antibiotic Note  [name] is a 75 y.o. female admitted on 06/05/2021 with Covid, found to have positive blood cultures.  Pharmacy has been consulted for Vancomycin dosing.  Today, 06/09/2021: - day #4 abx - afeb, wbc wnl - scr stable  Plan: - continue Vancomycin 1250 mg IV q36h for now - Follow up renal function, culture results, and clinical course.   -Will plan on checking vancomycin levels if plan if to treat with vancomycin for greater than 7 days  ________________________________________ Height: '5\' 1"'$  (154.9 cm) Weight: 65.8 kg (145 lb) IBW/kg (Calculated) : 47.8  Temp (24hrs), Avg:98.3 F (36.8 C), Min:97.7 F (36.5 C), Max:98.7 F (37.1 C)  Recent Labs  Lab 06/05/21 0953 06/05/21 1009 06/06/21 0418 06/07/21 0419 06/08/21 0325 06/09/21 0332  WBC 10.5  --  7.8 4.3 5.8 7.4  CREATININE 0.78  --  0.70 0.71 0.74 0.75  LATICACIDVEN  --  0.7  --   --   --   --      Estimated Creatinine Clearance: 53.6 mL/min (by C-G formula based on SCr of 0.75 mg/dL).    Allergies  Allergen Reactions   Ace Inhibitors Cough   Ambien [Zolpidem] Other (See Comments)    Odd Feeling   Crestor [Rosuvastatin]    Pravastatin    Prozac [Fluoxetine Hcl] Other (See Comments)    Decreased libido   Zoloft [Sertraline Hcl]    Antimicrobials this admission:  7/29 > Remdesivir >>8/2 7/29 > Amoxicillin >> 7/30 7/30 > Vancomycin >>    Microbiology results:  7/29 BCx:  updated from 2/4 to 3/4 bottles positive for MRSE ( R= ery, tratra, bactrim) & Staph hominis 7/31 rpt BCx x2:  7/29 COVID+  Thank you for allowing pharmacy to be a part of this patient's care.  Dia Sitter, PharmD, BCPS 06/09/2021 9:51 AM

## 2021-06-09 NOTE — Plan of Care (Signed)

## 2021-06-10 DIAGNOSIS — B957 Other staphylococcus as the cause of diseases classified elsewhere: Secondary | ICD-10-CM | POA: Diagnosis not present

## 2021-06-10 DIAGNOSIS — U071 COVID-19: Secondary | ICD-10-CM | POA: Diagnosis not present

## 2021-06-10 DIAGNOSIS — R7881 Bacteremia: Secondary | ICD-10-CM | POA: Diagnosis not present

## 2021-06-10 DIAGNOSIS — M069 Rheumatoid arthritis, unspecified: Secondary | ICD-10-CM | POA: Diagnosis not present

## 2021-06-10 LAB — BASIC METABOLIC PANEL
Anion gap: 5 (ref 5–15)
BUN: 16 mg/dL (ref 8–23)
CO2: 29 mmol/L (ref 22–32)
Calcium: 9.3 mg/dL (ref 8.9–10.3)
Chloride: 101 mmol/L (ref 98–111)
Creatinine, Ser: 0.72 mg/dL (ref 0.44–1.00)
GFR, Estimated: >60 mL/min (ref >60)
Glucose, Bld: 101 mg/dL — ABNORMAL HIGH (ref 70–99)
Potassium: 3.9 mmol/L (ref 3.5–5.1)
Sodium: 135 mmol/L (ref 135–145)

## 2021-06-10 LAB — CBC
HCT: 32.5 % — ABNORMAL LOW (ref 36.0–46.0)
Hemoglobin: 11 g/dL — ABNORMAL LOW (ref 12.0–15.0)
MCH: 31 pg (ref 26.0–34.0)
MCHC: 33.8 g/dL (ref 30.0–36.0)
MCV: 91.5 fL (ref 80.0–100.0)
Platelets: 125 10*3/uL — ABNORMAL LOW (ref 150–400)
RBC: 3.55 MIL/uL — ABNORMAL LOW (ref 3.87–5.11)
RDW: 14.1 % (ref 11.5–15.5)
WBC: 7.9 10*3/uL (ref 4.0–10.5)
nRBC: 0 % (ref 0.0–0.2)

## 2021-06-10 LAB — CULTURE, BLOOD (ROUTINE X 2)

## 2021-06-10 MED ORDER — DEXAMETHASONE 6 MG PO TABS: 6.0000 mg | ORAL_TABLET | Freq: Every day | ORAL | 0 refills | Status: AC

## 2021-06-10 MED ORDER — POLYETHYLENE GLYCOL 3350 17 G PO PACK
17.0000 g | PACK | Freq: Every day | ORAL | Status: DC
  Administered 2021-06-10: 17 g via ORAL
  Filled 2021-06-10: qty 1

## 2021-06-10 NOTE — Discharge Summary (Signed)
Physician Discharge Summary  [name] [id number] DOB: Dec 01, 1945 DOA: 06/05/2021  PCP: [name], MD  Admit date: 06/05/2021 Discharge date: 06/10/2021 30 Day Unplanned Readmission Risk Score    Flowsheet Row ED to Hosp-Admission (Current) from 06/05/2021 in [facility]  30 Day Unplanned Readmission Risk Score (%) 9.42 Filed at 06/10/2021 0801       This score is the patient's risk of an unplanned readmission within 30 days of being discharged (0 -100%). The score is based on dignosis, age, lab data, medications, orders, and past utilization.   Low:  0-14.9   Medium: 15-21.9   High: 22-29.9   Extreme: 30 and above          Admitted From: Home Disposition: Home  Recommendations for Outpatient Follow-up:  Follow up with PCP in 1-2 weeks Please obtain BMP/CBC in one week Please follow up with your PCP on the following pending results: Unresulted Labs (From admission, onward)    None         Home Health: None Equipment/Devices: None  Discharge Condition: Stable CODE STATUS: Full code Diet recommendation: Cardiac  Subjective: Seen and examined.  She is fully alert and oriented, sitting at the edge of the bed, has no complaints.  Excited to go home  Brief/Interim Summary: [name] is a 75 y.o. female with history of rheumatoid arthritis on methotrexate and Plaquenil, hypertension, hypothyroidism presented to the ER because of weakness.  She was admitted with covid 19 infection which was treated with remdesivir and Solu-Medrol and was noted to have staph epi bacteremia.  ID has been consulted and based off of sensitivities, they deemed it as contaminant.  Repeat blood culture from 06/07/2021 has remained negative.  Patient has not had any fever.  Chest x-ray without focal airspace disease and CRP peaked at 15.1 which is improving.  She is now cleared from ID to discharge without antibiotics.  Transthoracic echo  showed normal ejection fraction with no vegetation.  ID did not recommend any TEE.  Patient is feeling well and she is going to be discharged in stable condition.  She is being discharged on few more days of oral dexamethasone.  Discharge Diagnoses:  Principal Problem:   COVID-19 Active Problems:   Essential hypertension   Rheumatoid arthritis ([place])   Hyperlipidemia, mixed   Hypothyroidism   S/P shoulder replacement, right   Hyponatremia   COVID-19 virus infection   Staphylococcus epidermidis bacteremia    Discharge Instructions   Allergies as of 06/10/2021       Reactions   Ace Inhibitors Cough   Ambien [zolpidem] Other (See Comments)   Odd Feeling   Crestor [rosuvastatin]    Pravastatin    Prozac [fluoxetine Hcl] Other (See Comments)   Decreased libido   Zoloft [sertraline Hcl]         Medication List     STOP taking these medications    ALPRAZolam 1 MG tablet Commonly known as: XANAX   Magnesium 500 MG Tabs       TAKE these medications    alendronate 70 MG tablet Commonly known as: Fosamax Take 1 tablet once weekly with a full glass of water on an empty stomach for 30 minutes. What changed:  how much to take how to take this when to take this   aspirin 81 MG tablet Take 81 mg by mouth daily.   folic acid 1 MG tablet Commonly known as: FOLVITE Take 3 mg by mouth  daily.   gabapentin 600 MG tablet Commonly known as: NEURONTIN TAKE 1/2 TO 1 TABLET 2 TO 3 TIMES DAILY AS NEEDED FOR PAIN What changed:  how much to take how to take this when to take this additional instructions   hydrochlorothiazide 12.5 MG tablet Commonly known as: HYDRODIURIL Take  1 tablet  Daily  for BP & Fluid Retention What changed:  how much to take how to take this when to take this additional instructions   hydroxychloroquine 200 MG tablet Commonly known as: PLAQUENIL Take 200 mg by mouth 2 (two) times daily.   ibuprofen 800 MG tablet Commonly known as:  IBU TAKE 1 TABLET THREE TIMES DAILY WITH FOOD AS NEEDED FOR PAIN AND INFLAMMATION What changed:  how much to take how to take this when to take this reasons to take this additional instructions   levothyroxine 137 MCG tablet Commonly known as: SYNTHROID TAKE 1 TAB DAILY ON AN EMPTY STOMACH WITH ONLY WATER X30 MIN, NO ANTACID MEDS, CALCIUM OR MAGNESIUM X4 HOURS. AVOID BIOTIN What changed:  how much to take how to take this when to take this   MELATONIN PO Take 1 tablet by mouth at bedtime.   methotrexate 50 MG/2ML injection Inject 25 mg into the skin once a week. Wednesdays   olmesartan 20 MG tablet Commonly known as: BENICAR TAKE 1 TABLET EVERY DAY FOR BLOOD PRESSURE What changed:  how much to take how to take this when to take this   omeprazole 40 MG capsule Commonly known as: PRILOSEC TAKE 1 CAPSULE TWICE DAILY FOR ACID REFLUX What changed: See the new instructions.   traZODone 150 MG tablet Commonly known as: DESYREL Take  1 tablet  1 hour  before Bedtime  as needed for Sleep What changed:  how much to take how to take this when to take this additional instructions   vitamin C 1000 MG tablet Take 1,000 mg by mouth daily.   VITAMIN D PO Take 5,000 Units by mouth See admin instructions. Takes 3 times a week   zinc gluconate 50 MG tablet Take 50 mg by mouth daily.        Follow-up Information     [name], MD Follow up in 1 week(s).   Specialty: Internal Medicine Contact information: [address] [place] [address] [phone]                Allergies  Allergen Reactions   Ace Inhibitors Cough   Ambien [Zolpidem] Other (See Comments)    Odd Feeling   Crestor [Rosuvastatin]    Pravastatin    Prozac [Fluoxetine Hcl] Other (See Comments)    Decreased libido   Zoloft [Sertraline Hcl]     Consultations: ID   Procedures/Studies: DG Chest Port 1 View  Result Date: 06/05/2021 CLINICAL DATA:  Shortness of  breath EXAM: PORTABLE CHEST 1 VIEW COMPARISON:  Radiograph 11/21/2019 FINDINGS: Borderline enlarged cardiac silhouette.No focal airspace disease. No pleural effusion or pneumothorax.Anatomic right shoulder arthroplasty. 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 No acute osseous abnormality. IMPRESSION: No focal airspace disease. Electronically Signed   By: Maurine Simmering   On: 06/05/2021 11:32   ECHOCARDIOGRAM COMPLETE  Result Date: 06/08/2021    ECHOCARDIOGRAM REPORT   Patient Name:   MERIWETHER SHNAYDER Date of Exam: 06/08/2021 Medical Rec #:  [id number]          Height:       61.0 in Accession #:    [id number]  Weight:       145.0 lb Date of Birth:  Oct 29, 1946           BSA:          1.647 m Patient Age:    87 years           BP:           140/82 mmHg Patient Gender: F                  HR:           51 bpm. Exam Location:  Inpatient Procedure: 2D Echo, Cardiac Doppler and Color Doppler Indications:    Bacteremia R78.81  History:        Patient has prior history of Echocardiogram examinations, most                 recent 10/11/2011. Signs/Symptoms:Murmur; Risk                 Factors:Hypertension and Dyslipidemia. COVID 19. Thyroid                 disease. GERD.  Sonographer:    Darlina Sicilian RDCS Referring Phys: [phone] A CALDWELL POWELL [place]  1. Left ventricular ejection fraction, by estimation, is 60 to 65%. The left ventricle has normal function. The left ventricle has no regional wall motion abnormalities. There is mild left ventricular hypertrophy. Left ventricular diastolic parameters are consistent with Grade II diastolic dysfunction (pseudonormalization). Elevated left atrial pressure.  2. Right ventricular systolic function is normal. The right ventricular size is normal. Tricuspid regurgitation signal is inadequate for assessing PA pressure.  3. Left atrial size was severely dilated.  4. The mitral valve is normal in structure. No evidence of mitral valve regurgitation. No  evidence of mitral stenosis.  5. The aortic valve is tricuspid. Aortic valve regurgitation is not visualized. No aortic stenosis is present.  6. The inferior vena cava is normal in size with greater than 50% respiratory variability, suggesting right atrial pressure of 3 mmHg. Conclusion(s)/Recommendation(s): No vegetation seen. If high clinical suspicion for endocarditis, consider TEE. FINDINGS  Left Ventricle: Left ventricular ejection fraction, by estimation, is 60 to 65%. The left ventricle has normal function. The left ventricle has no regional wall motion abnormalities. The left ventricular internal cavity size was normal in size. There is  mild left ventricular hypertrophy. Left ventricular diastolic parameters are consistent with Grade II diastolic dysfunction (pseudonormalization). Elevated left atrial pressure. Right Ventricle: The right ventricular size is normal. No increase in right ventricular wall thickness. Right ventricular systolic function is normal. Tricuspid regurgitation signal is inadequate for assessing PA pressure. Left Atrium: Left atrial size was severely dilated. Right Atrium: Right atrial size was normal in size. Pericardium: Trivial pericardial effusion is present. Mitral Valve: The mitral valve is normal in structure. No evidence of mitral valve regurgitation. No evidence of mitral valve stenosis. Tricuspid Valve: The tricuspid valve is not well visualized. Tricuspid valve regurgitation is not demonstrated. Aortic Valve: The aortic valve is tricuspid. Aortic valve regurgitation is not visualized. No aortic stenosis is present. Pulmonic Valve: The pulmonic valve was not well visualized. Pulmonic valve regurgitation is trivial. Aorta: The aortic root and ascending aorta are structurally normal, with no evidence of dilitation. Venous: The inferior vena cava is normal in size with greater than 50% respiratory variability, suggesting right atrial pressure of 3 mmHg. IAS/Shunts: The  interatrial septum was not well visualized.  LEFT VENTRICLE PLAX 2D LVIDd:  4.80 cm  Diastology LVIDs:         2.90 cm  LV e' medial:    3.91 cm/s LV PW:         1.00 cm  LV E/e' medial:  19.8 LV IVS:        1.00 cm  LV e' lateral:   4.54 cm/s LVOT diam:     1.90 cm  LV E/e' lateral: 17.1 LV SV:         64 LV SV Index:   39 LVOT Area:     2.84 cm  RIGHT VENTRICLE RV S prime:     12.20 cm/s TAPSE (M-mode): 2.0 cm LEFT ATRIUM              Index       RIGHT ATRIUM          Index LA diam:        3.50 cm  2.12 cm/m  RA Area:     9.75 cm LA Vol (A2C):   68.9 ml  41.82 ml/m RA Volume:   16.10 ml 9.77 ml/m LA Vol (A4C):   112.0 ml 67.98 ml/m LA Biplane Vol: 93.3 ml  56.63 ml/m  AORTIC VALVE LVOT Vmax:   126.00 cm/s LVOT Vmean:  70.300 cm/s LVOT VTI:    0.226 m  AORTA Ao Root diam: 2.80 cm Ao Asc diam:  2.80 cm MITRAL VALVE MV Area (PHT): 4.49 cm    SHUNTS MV Decel Time: 169 msec    Systemic VTI:  0.23 m MV E velocity: 77.50 cm/s  Systemic Diam: 1.90 cm MV A velocity: 42.70 cm/s MV E/A ratio:  1.81 Oswaldo Milian MD Electronically signed by Oswaldo Milian MD Signature Date/Time: 06/08/2021/2:53:18 PM    Final      Discharge Exam: Vitals:   06/09/21 2117 06/10/21 0426  BP: (!) 145/76 139/64  Pulse: (!) 51 (!) 51  Resp: 20 20  Temp: 99 F (37.2 C) 98.2 F (36.8 C)  SpO2: 97% 94%   Vitals:   06/09/21 0604 06/09/21 1252 06/09/21 2117 06/10/21 0426  BP: (!) 145/73 (!) 141/74 (!) 145/76 139/64  Pulse: (!) 52 (!) 53 (!) 51 (!) 51  Resp:  '16 20 20  '$ Temp: 98.5 F (36.9 C) 97.7 F (36.5 C) 99 F (37.2 C) 98.2 F (36.8 C)  TempSrc: Oral Oral Oral Oral  SpO2: 95% 96% 97% 94%  Weight:      Height:        General: Pt is alert, awake, not in acute distress Cardiovascular: RRR, S1/S2 +, no rubs, no gallops Respiratory: CTA bilaterally, no wheezing, no rhonchi Abdominal: Soft, NT, ND, bowel sounds + Extremities: no edema, no cyanosis    The results of significant diagnostics from  this hospitalization (including imaging, microbiology, ancillary and laboratory) are listed below for reference.     Microbiology: Recent Results (from the past 240 hour(s))  Blood Culture (routine x 2)     Status: Abnormal   Collection Time: 06/05/21  9:48 AM   Specimen: BLOOD  Result Value Ref Range Status   Specimen Description   Final    BLOOD RIGHT ANTECUBITAL Performed at [facility] [address], [place], [place] [address]    Special Requests   Final    BOTTLES DRAWN AEROBIC AND ANAEROBIC Blood Culture results may not be optimal due to an inadequate volume of blood received in culture bottles Performed at [facility] [address] [place],  [place] [address]    Culture  Setup Time   Final    GRAM POSITIVE COCCI IN CLUSTERS ANAEROBIC BOTTLE ONLY CRITICAL VALUE NOTED.  VALUE IS CONSISTENT WITH PREVIOUSLY REPORTED AND CALLED VALUE. Performed at [facility], [place] [address] [place], [place] [address]    Culture STAPHYLOCOCCUS EPIDERMIDIS (A)  Final   Report Status 06/10/2021 FINAL  Final   Organism ID, Bacteria STAPHYLOCOCCUS EPIDERMIDIS  Final      Susceptibility   Staphylococcus epidermidis - MIC*    CIPROFLOXACIN <=0.5 SENSITIVE Sensitive     ERYTHROMYCIN >=8 RESISTANT Resistant     GENTAMICIN <=0.5 SENSITIVE Sensitive     OXACILLIN <=0.25 SENSITIVE Sensitive     TETRACYCLINE >=16 RESISTANT Resistant     VANCOMYCIN 2 SENSITIVE Sensitive     TRIMETH/SULFA <=10 SENSITIVE Sensitive     CLINDAMYCIN <=0.25 SENSITIVE Sensitive     RIFAMPIN <=0.5 SENSITIVE Sensitive     Inducible Clindamycin NEGATIVE Sensitive     * STAPHYLOCOCCUS EPIDERMIDIS  Blood Culture (routine x 2)     Status: Abnormal   Collection Time: 06/05/21 10:02 AM   Specimen: BLOOD  Result Value Ref Range Status   Specimen Description   Final    BLOOD LEFT ANTECUBITAL Performed at [facility] [address] [place], [place] [address]     Special Requests   Final    BOTTLES DRAWN AEROBIC AND ANAEROBIC Blood Culture results may not be optimal due to an inadequate volume of blood received in culture bottles Performed at [facility] [address], [place], [place] [address]    Culture  Setup Time   Final    IN BOTH AEROBIC AND ANAEROBIC BOTTLES GRAM POSITIVE COCCI IN CLUSTERS CRITICAL RESULT CALLED TO, READ BACK BY AND VERIFIED WITH: PHARM D C.SHADE ON [id number] AT 0902 BY E.PARRISH    Culture (A)  Final    STAPHYLOCOCCUS EPIDERMIDIS STAPHYLOCOCCUS HOMINIS THE SIGNIFICANCE OF ISOLATING THIS ORGANISM FROM A SINGLE SET OF BLOOD CULTURES WHEN MULTIPLE SETS ARE DRAWN IS UNCERTAIN. PLEASE NOTIFY THE MICROBIOLOGY DEPARTMENT WITHIN ONE WEEK IF SPECIATION AND SENSITIVITIES ARE REQUIRED. Performed at [facility], [place] [address] [place], [place] [address]    Report Status 06/08/2021 FINAL  Final   Organism ID, Bacteria STAPHYLOCOCCUS EPIDERMIDIS  Final      Susceptibility   Staphylococcus epidermidis - MIC*    CIPROFLOXACIN <=0.5 SENSITIVE Sensitive     ERYTHROMYCIN >=8 RESISTANT Resistant     GENTAMICIN <=0.5 SENSITIVE Sensitive     OXACILLIN <=0.25 SENSITIVE Sensitive     TETRACYCLINE >=16 RESISTANT Resistant     VANCOMYCIN 2 SENSITIVE Sensitive     TRIMETH/SULFA 80 RESISTANT Resistant     CLINDAMYCIN <=0.25 SENSITIVE Sensitive     RIFAMPIN <=0.5 SENSITIVE Sensitive     Inducible Clindamycin NEGATIVE Sensitive     * STAPHYLOCOCCUS EPIDERMIDIS  Blood Culture ID Panel (Reflexed)     Status: Abnormal   Collection Time: 06/05/21 10:02 AM  Result Value Ref Range Status   Enterococcus faecalis NOT DETECTED NOT DETECTED Final   Enterococcus Faecium NOT DETECTED NOT DETECTED Final   Listeria monocytogenes NOT DETECTED NOT DETECTED Final   Staphylococcus species DETECTED (A) NOT DETECTED Final    Comment: CRITICAL RESULT CALLED TO, READ BACK BY AND VERIFIED WITH: PHARM D C.SHADE ON [id number] AT 0902 BY  E.PARRISH    Staphylococcus aureus (BCID) NOT DETECTED NOT DETECTED Final   Staphylococcus epidermidis DETECTED (A) NOT DETECTED  Final    Comment: Methicillin (oxacillin) resistant coagulase negative staphylococcus. Possible blood culture contaminant (unless isolated from more than one blood culture draw or clinical case suggests pathogenicity). No antibiotic treatment is indicated for blood  culture contaminants. CRITICAL RESULT CALLED TO, READ BACK BY AND VERIFIED WITH: PHARM D C.SHADE ON [id number] AT 0902 BY E.PARRISH    Staphylococcus lugdunensis NOT DETECTED NOT DETECTED Final   Streptococcus species NOT DETECTED NOT DETECTED Final   Streptococcus agalactiae NOT DETECTED NOT DETECTED Final   Streptococcus pneumoniae NOT DETECTED NOT DETECTED Final   Streptococcus pyogenes NOT DETECTED NOT DETECTED Final   A.calcoaceticus-baumannii NOT DETECTED NOT DETECTED Final   Bacteroides fragilis NOT DETECTED NOT DETECTED Final   Enterobacterales NOT DETECTED NOT DETECTED Final   Enterobacter cloacae complex NOT DETECTED NOT DETECTED Final   Escherichia coli NOT DETECTED NOT DETECTED Final   Klebsiella aerogenes NOT DETECTED NOT DETECTED Final   Klebsiella oxytoca NOT DETECTED NOT DETECTED Final   Klebsiella pneumoniae NOT DETECTED NOT DETECTED Final   Proteus species NOT DETECTED NOT DETECTED Final   Salmonella species NOT DETECTED NOT DETECTED Final   Serratia marcescens NOT DETECTED NOT DETECTED Final   Haemophilus influenzae NOT DETECTED NOT DETECTED Final   Neisseria meningitidis NOT DETECTED NOT DETECTED Final   Pseudomonas aeruginosa NOT DETECTED NOT DETECTED Final   Stenotrophomonas maltophilia NOT DETECTED NOT DETECTED Final   Candida albicans NOT DETECTED NOT DETECTED Final   Candida auris NOT DETECTED NOT DETECTED Final   Candida glabrata NOT DETECTED NOT DETECTED Final   Candida krusei NOT DETECTED NOT DETECTED Final   Candida parapsilosis NOT DETECTED NOT DETECTED Final    Candida tropicalis NOT DETECTED NOT DETECTED Final   Cryptococcus neoformans/gattii NOT DETECTED NOT DETECTED Final   Methicillin resistance mecA/C DETECTED (A) NOT DETECTED Final    Comment: CRITICAL RESULT CALLED TO, READ BACK BY AND VERIFIED WITH: PHARM D C.SHADE ON [id number] AT 0902 BY E.PARRISH Performed at [facility], [place] [address] [place], [place] [address]   Resp Panel by RT-PCR (Flu A&B, Covid) Nasopharyngeal Swab     Status: Abnormal   Collection Time: 06/05/21 10:14 AM   Specimen: Nasopharyngeal Swab; Nasopharyngeal(NP) swabs in vial transport medium  Result Value Ref Range Status   SARS Coronavirus 2 by RT PCR POSITIVE (A) NEGATIVE Final    Comment: RESULT CALLED TO, READ BACK BY AND VERIFIED WITH: BENNET L. ON 06/05/2021 @ 1200 BY MECIAL J.  (NOTE) SARS-CoV-2 target nucleic acids are DETECTED.  The SARS-CoV-2 RNA is generally detectable in upper respiratory specimens during the acute phase of infection. Positive results are indicative of the presence of the identified virus, but do not rule out bacterial infection or co-infection with other pathogens not detected by the test. Clinical correlation with patient history and other diagnostic information is necessary to determine patient infection status. The expected result is Negative.  Fact Sheet for Patients: EntrepreneurPulse.com.au  Fact Sheet for Healthcare Providers: IncredibleEmployment.be  This test is not yet approved or cleared by the [place] FDA and  has been authorized for detection and/or diagnosis of SARS-CoV-2 by FDA under an Emergency Use Authorization (EUA).  This EUA will remain in effect (meaning this  test can be used) for the duration of  the COVID-19 declaration under Section 564(b)(1) of the Act, 21 U.S.C. section 360bbb-3(b)(1), unless the authorization is terminated or revoked sooner.     Influenza A by PCR NEGATIVE NEGATIVE Final   Influenza  B by  PCR NEGATIVE NEGATIVE Final    Comment: (NOTE) The Xpert Xpress SARS-CoV-2/FLU/RSV plus assay is intended as an aid in the diagnosis of influenza from Nasopharyngeal swab specimens and should not be used as a sole basis for treatment. Nasal washings and aspirates are unacceptable for Xpert Xpress SARS-CoV-2/FLU/RSV testing.  Fact Sheet for Patients: EntrepreneurPulse.com.au  Fact Sheet for Healthcare Providers: IncredibleEmployment.be  This test is not yet approved or cleared by the [place] FDA and has been authorized for detection and/or diagnosis of SARS-CoV-2 by FDA under an Emergency Use Authorization (EUA). This EUA will remain in effect (meaning this test can be used) for the duration of the COVID-19 declaration under Section 564(b)(1) of the Act, 21 U.S.C. section 360bbb-3(b)(1), unless the authorization is terminated or revoked.  Performed at [facility], [place] [address] [place], [place] [address]   Culture, blood (routine x 2)     Status: None (Preliminary result)   Collection Time: 06/07/21  8:38 AM   Specimen: BLOOD  Result Value Ref Range Status   Specimen Description   Final    BLOOD BLOOD RIGHT HAND Performed at [facility] [address], [place], [place] [address]    Special Requests   Final    BOTTLES DRAWN AEROBIC ONLY Blood Culture results may not be optimal due to an inadequate volume of blood received in culture bottles Performed at [facility] [address], [place], [place] [address]    Culture   Final    NO GROWTH 3 DAYS Performed at [facility], [place] [address], [place], [place] [address]    Report Status PENDING  Incomplete  Culture, blood (routine x 2)     Status: None (Preliminary result)   Collection Time: 06/07/21  8:38 AM   Specimen: BLOOD  Result Value Ref Range Status   Specimen Description   Final    BLOOD LEFT  ANTECUBITAL Performed at [facility] [address] [place], [place] [address]    Special Requests   Final    BOTTLES DRAWN AEROBIC ONLY Blood Culture adequate volume Performed at [facility] [address] [place], [place] [address]    Culture   Final    NO GROWTH 3 DAYS Performed at [facility], [place] [address], [place], [place] [address]    Report Status PENDING  Incomplete     Labs: BNP (last 3 results) No results for input(s): BNP in the last 8760 hours. Basic Metabolic Panel: Recent Labs  Lab 06/06/21 0418 06/07/21 0419 06/08/21 0325 06/09/21 0332 06/10/21 0341  NA 133* 136 132* 137 135  K 3.8 4.2 4.9 4.0 3.9  CL 98 100 100 100 101  CO2 '28 28 26 30 29  '$ GLUCOSE 93 128* 125* 120* 101*  BUN '11 16 17 16 16  '$ CREATININE 0.70 0.71 0.74 0.75 0.72  CALCIUM 9.2 9.9 9.4 10.1 9.3   Liver Function Tests: Recent Labs  Lab 06/05/21 0953 06/06/21 0418 06/07/21 0419 06/08/21 0325 06/09/21 0332  AST 48* 47* 49* 75* 79*  ALT '24 23 22 25 '$ 36  ALKPHOS 82 75 61 56 51  BILITOT 0.9 0.8 0.8 1.4* 0.9  PROT 6.2* 5.6* 5.2* 5.0* 5.4*  ALBUMIN 4.0 3.4* 3.1* 3.0* 3.3*   No results for input(s): LIPASE, AMYLASE in the last 168 hours. No results for input(s): AMMONIA in the last 168 hours. CBC: Recent Labs  Lab 06/05/21 [phone] 06/06/21 0418 06/07/21 0419  06/08/21 0325 06/09/21 0332 06/10/21 0341  WBC 10.5 7.8 4.3 5.8 7.4 7.9  NEUTROABS 9.7* 6.7 3.5 4.8 6.1  --   HGB 12.6 11.9* 12.0 11.7* 12.8 11.0*  HCT 37.4 35.3* 36.3 34.7* 38.0 32.5*  MCV 93.5 93.1 93.6 92.5 92.2 91.5  PLT 102* 94* 101* 111* 137* 125*   Cardiac Enzymes: No results for input(s): CKTOTAL, CKMB, CKMBINDEX, TROPONINI in the last 168 hours. BNP: Invalid input(s): POCBNP CBG: No results for input(s): GLUCAP in the last 168 hours. D-Dimer Recent Labs    06/08/21 0325 06/09/21 0332  DDIMER 0.45 0.43   Hgb A1c No results for input(s): HGBA1C in the last 72  hours. Lipid Profile No results for input(s): CHOL, HDL, LDLCALC, TRIG, CHOLHDL, LDLDIRECT in the last 72 hours. Thyroid function studies No results for input(s): TSH, T4TOTAL, T3FREE, THYROIDAB in the last 72 hours.  Invalid input(s): FREET3 Anemia work up No results for input(s): VITAMINB12, FOLATE, FERRITIN, TIBC, IRON, RETICCTPCT in the last 72 hours. Urinalysis    Component Value Date/Time   COLORURINE YELLOW 07/08/2020 0957   APPEARANCEUR CLEAR 07/08/2020 0957   LABSPEC 1.007 07/08/2020 0957   PHURINE 6.5 07/08/2020 0957   GLUCOSEU NEGATIVE 07/08/2020 0957   HGBUR NEGATIVE 07/08/2020 0957   BILIRUBINUR NEGATIVE 04/13/2017 1101   KETONESUR NEGATIVE 07/08/2020 0957   PROTEINUR NEGATIVE 07/08/2020 0957   UROBILINOGEN 0.2 06/28/2011 1225   NITRITE NEGATIVE 07/08/2020 0957   LEUKOCYTESUR TRACE (A) 07/08/2020 0957   Sepsis Labs Invalid input(s): PROCALCITONIN,  WBC,  LACTICIDVEN Microbiology Recent Results (from the past 240 hour(s))  Blood Culture (routine x 2)     Status: Abnormal   Collection Time: 06/05/21  9:48 AM   Specimen: BLOOD  Result Value Ref Range Status   Specimen Description   Final    BLOOD RIGHT ANTECUBITAL Performed at [facility], [place] [address], [place], [place] [address]    Special Requests   Final    BOTTLES DRAWN AEROBIC AND ANAEROBIC Blood Culture results may not be optimal due to an inadequate volume of blood received in culture bottles Performed at [facility] [address], [place], [place] [address]    Culture  Setup Time   Final    GRAM POSITIVE COCCI IN CLUSTERS ANAEROBIC BOTTLE ONLY CRITICAL VALUE NOTED.  VALUE IS CONSISTENT WITH PREVIOUSLY REPORTED AND CALLED VALUE. Performed at [facility], [place] [address], [place], [place] [address]    Culture STAPHYLOCOCCUS EPIDERMIDIS (A)  Final   Report Status 06/10/2021 FINAL  Final   Organism ID, Bacteria STAPHYLOCOCCUS EPIDERMIDIS  Final       Susceptibility   Staphylococcus epidermidis - MIC*    CIPROFLOXACIN <=0.5 SENSITIVE Sensitive     ERYTHROMYCIN >=8 RESISTANT Resistant     GENTAMICIN <=0.5 SENSITIVE Sensitive     OXACILLIN <=0.25 SENSITIVE Sensitive     TETRACYCLINE >=16 RESISTANT Resistant     VANCOMYCIN 2 SENSITIVE Sensitive     TRIMETH/SULFA <=10 SENSITIVE Sensitive     CLINDAMYCIN <=0.25 SENSITIVE Sensitive     RIFAMPIN <=0.5 SENSITIVE Sensitive     Inducible Clindamycin NEGATIVE Sensitive     * STAPHYLOCOCCUS EPIDERMIDIS  Blood Culture (routine x 2)     Status: Abnormal   Collection Time: 06/05/21 10:02 AM   Specimen: BLOOD  Result Value Ref Range Status   Specimen Description   Final    BLOOD LEFT ANTECUBITAL Performed at [facility] [address] [place], [place]  [address]    Special Requests   Final    BOTTLES DRAWN AEROBIC AND ANAEROBIC Blood Culture results may not be optimal due to an inadequate volume of blood received in culture bottles Performed at [facility], [place] [address] [place], [place] [address]    Culture  Setup Time   Final    IN BOTH AEROBIC AND ANAEROBIC BOTTLES GRAM POSITIVE COCCI IN CLUSTERS CRITICAL RESULT CALLED TO, READ BACK BY AND VERIFIED WITH: PHARM D C.SHADE ON [id number] AT 0902 BY E.PARRISH    Culture (A)  Final    STAPHYLOCOCCUS EPIDERMIDIS STAPHYLOCOCCUS HOMINIS THE SIGNIFICANCE OF ISOLATING THIS ORGANISM FROM A SINGLE SET OF BLOOD CULTURES WHEN MULTIPLE SETS ARE DRAWN IS UNCERTAIN. PLEASE NOTIFY THE MICROBIOLOGY DEPARTMENT WITHIN ONE WEEK IF SPECIATION AND SENSITIVITIES ARE REQUIRED. Performed at [facility], [place] [address], [place], [place] [address]    Report Status 06/08/2021 FINAL  Final   Organism ID, Bacteria STAPHYLOCOCCUS EPIDERMIDIS  Final      Susceptibility   Staphylococcus epidermidis - MIC*    CIPROFLOXACIN <=0.5 SENSITIVE Sensitive     ERYTHROMYCIN >=8 RESISTANT Resistant     GENTAMICIN <=0.5 SENSITIVE  Sensitive     OXACILLIN <=0.25 SENSITIVE Sensitive     TETRACYCLINE >=16 RESISTANT Resistant     VANCOMYCIN 2 SENSITIVE Sensitive     TRIMETH/SULFA 80 RESISTANT Resistant     CLINDAMYCIN <=0.25 SENSITIVE Sensitive     RIFAMPIN <=0.5 SENSITIVE Sensitive     Inducible Clindamycin NEGATIVE Sensitive     * STAPHYLOCOCCUS EPIDERMIDIS  Blood Culture ID Panel (Reflexed)     Status: Abnormal   Collection Time: 06/05/21 10:02 AM  Result Value Ref Range Status   Enterococcus faecalis NOT DETECTED NOT DETECTED Final   Enterococcus Faecium NOT DETECTED NOT DETECTED Final   Listeria monocytogenes NOT DETECTED NOT DETECTED Final   Staphylococcus species DETECTED (A) NOT DETECTED Final    Comment: CRITICAL RESULT CALLED TO, READ BACK BY AND VERIFIED WITH: PHARM D C.SHADE ON [id number] AT 0902 BY E.PARRISH    Staphylococcus aureus (BCID) NOT DETECTED NOT DETECTED Final   Staphylococcus epidermidis DETECTED (A) NOT DETECTED Final    Comment: Methicillin (oxacillin) resistant coagulase negative staphylococcus. Possible blood culture contaminant (unless isolated from more than one blood culture draw or clinical case suggests pathogenicity). No antibiotic treatment is indicated for blood  culture contaminants. CRITICAL RESULT CALLED TO, READ BACK BY AND VERIFIED WITH: PHARM D C.SHADE ON [id number] AT 0902 BY E.PARRISH    Staphylococcus lugdunensis NOT DETECTED NOT DETECTED Final   Streptococcus species NOT DETECTED NOT DETECTED Final   Streptococcus agalactiae NOT DETECTED NOT DETECTED Final   Streptococcus pneumoniae NOT DETECTED NOT DETECTED Final   Streptococcus pyogenes NOT DETECTED NOT DETECTED Final   A.calcoaceticus-baumannii NOT DETECTED NOT DETECTED Final   Bacteroides fragilis NOT DETECTED NOT DETECTED Final   Enterobacterales NOT DETECTED NOT DETECTED Final   Enterobacter cloacae complex NOT DETECTED NOT DETECTED Final   Escherichia coli NOT DETECTED NOT DETECTED Final   Klebsiella aerogenes  NOT DETECTED NOT DETECTED Final   Klebsiella oxytoca NOT DETECTED NOT DETECTED Final   Klebsiella pneumoniae NOT DETECTED NOT DETECTED Final   Proteus species NOT DETECTED NOT DETECTED Final   Salmonella species NOT DETECTED NOT DETECTED Final   Serratia marcescens NOT DETECTED NOT DETECTED Final   Haemophilus influenzae NOT DETECTED NOT DETECTED Final   Neisseria meningitidis NOT DETECTED NOT DETECTED Final   Pseudomonas aeruginosa NOT DETECTED NOT  DETECTED Final   Stenotrophomonas maltophilia NOT DETECTED NOT DETECTED Final   Candida albicans NOT DETECTED NOT DETECTED Final   Candida auris NOT DETECTED NOT DETECTED Final   Candida glabrata NOT DETECTED NOT DETECTED Final   Candida krusei NOT DETECTED NOT DETECTED Final   Candida parapsilosis NOT DETECTED NOT DETECTED Final   Candida tropicalis NOT DETECTED NOT DETECTED Final   Cryptococcus neoformans/gattii NOT DETECTED NOT DETECTED Final   Methicillin resistance mecA/C DETECTED (A) NOT DETECTED Final    Comment: CRITICAL RESULT CALLED TO, READ BACK BY AND VERIFIED WITH: PHARM D C.SHADE ON [id number] AT 0902 BY E.PARRISH Performed at [facility], [place] [address] [place], [place] [address]   Resp Panel by RT-PCR (Flu A&B, Covid) Nasopharyngeal Swab     Status: Abnormal   Collection Time: 06/05/21 10:14 AM   Specimen: Nasopharyngeal Swab; Nasopharyngeal(NP) swabs in vial transport medium  Result Value Ref Range Status   SARS Coronavirus 2 by RT PCR POSITIVE (A) NEGATIVE Final    Comment: RESULT CALLED TO, READ BACK BY AND VERIFIED WITH: BENNET L. ON 06/05/2021 @ 1200 BY MECIAL J.  (NOTE) SARS-CoV-2 target nucleic acids are DETECTED.  The SARS-CoV-2 RNA is generally detectable in upper respiratory specimens during the acute phase of infection. Positive results are indicative of the presence of the identified virus, but do not rule out bacterial infection or co-infection with other pathogens not detected by the test. Clinical  correlation with patient history and other diagnostic information is necessary to determine patient infection status. The expected result is Negative.  Fact Sheet for Patients: EntrepreneurPulse.com.au  Fact Sheet for Healthcare Providers: IncredibleEmployment.be  This test is not yet approved or cleared by the [place] FDA and  has been authorized for detection and/or diagnosis of SARS-CoV-2 by FDA under an Emergency Use Authorization (EUA).  This EUA will remain in effect (meaning this  test can be used) for the duration of  the COVID-19 declaration under Section 564(b)(1) of the Act, 21 U.S.C. section 360bbb-3(b)(1), unless the authorization is terminated or revoked sooner.     Influenza A by PCR NEGATIVE NEGATIVE Final   Influenza B by PCR NEGATIVE NEGATIVE Final    Comment: (NOTE) The Xpert Xpress SARS-CoV-2/FLU/RSV plus assay is intended as an aid in the diagnosis of influenza from Nasopharyngeal swab specimens and should not be used as a sole basis for treatment. Nasal washings and aspirates are unacceptable for Xpert Xpress SARS-CoV-2/FLU/RSV testing.  Fact Sheet for Patients: EntrepreneurPulse.com.au  Fact Sheet for Healthcare Providers: IncredibleEmployment.be  This test is not yet approved or cleared by the [place] FDA and has been authorized for detection and/or diagnosis of SARS-CoV-2 by FDA under an Emergency Use Authorization (EUA). This EUA will remain in effect (meaning this test can be used) for the duration of the COVID-19 declaration under Section 564(b)(1) of the Act, 21 U.S.C. section 360bbb-3(b)(1), unless the authorization is terminated or revoked.  Performed at [facility], [place] [address], [place], [place] [address]   Culture, blood (routine x 2)     Status: None (Preliminary result)   Collection Time: 06/07/21  8:38 AM   Specimen: BLOOD  Result  Value Ref Range Status   Specimen Description   Final    BLOOD BLOOD RIGHT HAND Performed at [facility] [address] [place],  [address]    Special Requests   Final    BOTTLES DRAWN AEROBIC ONLY Blood Culture results may not be  optimal due to an inadequate volume of blood received in culture bottles Performed at [facility] [address], [place], [place] [address]    Culture   Final    NO GROWTH 3 DAYS Performed at [facility], [place] [address] [place], [place] [address]    Report Status PENDING  Incomplete  Culture, blood (routine x 2)     Status: None (Preliminary result)   Collection Time: 06/07/21  8:38 AM   Specimen: BLOOD  Result Value Ref Range Status   Specimen Description   Final    BLOOD LEFT ANTECUBITAL Performed at [facility] [address], [place], [place] [address]    Special Requests   Final    BOTTLES DRAWN AEROBIC ONLY Blood Culture adequate volume Performed at [facility] [address], [place], [place] [address]    Culture   Final    NO GROWTH 3 DAYS Performed at [facility], [place] [address], [place], [place] [address]    Report Status PENDING  Incomplete     Time coordinating discharge: Over 30 minutes  SIGNED:   Darliss Cheney, MD  [facility] 06/10/2021, 10:08 AM  If 7PM-7AM, please contact night-coverage [email or web address]

## 2021-06-10 NOTE — Progress Notes (Signed)
Discharge paperwork given and explained to patient utilizing the teach back method. PIV removed. Pressure dressing applied. Pt awaiting transport. 

## 2021-06-10 NOTE — Progress Notes (Signed)
[facility] Infectious Disease  Date of Admission:  06/05/2021           Reason for visit: Follow up on bacteremia  Current antibiotics: Vancomycin    ASSESSMENT:    Staph epi positive blood cultures COVID RA on methotrexate/Plaquenil  PLAN:    06/05/2021 blood cultures with discordant staph epidermidis susceptibilities.  Suspect this to be a contaminant.  Nonetheless, she has received approximately 5 days of antibiotics.  She has no shoulder pain and a TTE was unremarkable.  Will discontinue her antibiotics at this time and sign off. COVID treatment per primary team.   Principal Problem:   COVID-19 Active Problems:   Essential hypertension   Rheumatoid arthritis ([place])   Hyperlipidemia, mixed   Hypothyroidism   S/P shoulder replacement, right   Hyponatremia   COVID-19 virus infection   Staphylococcus epidermidis bacteremia    MEDICATIONS:    Scheduled Meds:  vitamin C  1,000 mg Oral Daily   aspirin  81 mg Oral Daily   cholecalciferol  5,000 Units Oral Once per day on Tue Thu Sat   folic acid  3 mg Oral Daily   gabapentin  900 mg Oral QHS   guaiFENesin  1,200 mg Oral BID   levothyroxine  137 mcg Oral QAC breakfast   polyethylene glycol  17 g Oral Daily   traZODone  150 mg Oral QHS   zinc sulfate  220 mg Oral Daily   Continuous Infusions:  vancomycin Stopped (06/09/21 1222)   PRN Meds:.acetaminophen **OR** acetaminophen, melatonin, menthol-cetylpyridinium, phenol  SUBJECTIVE:   24 hour events:  No acute events noted overnight Afebrile, T-max 99 WBC 7.9 Repeat blood cultures from 7/31 no growth Initial blood cultures 7/29 with discordant staph epidermidis susceptibilities No new imaging  No complaints.  Ready to go home.  No fevers, chills, SOB, shoulder pain.   Review of Systems  All other systems reviewed and are negative.    OBJECTIVE:   Blood pressure 139/64, pulse (!) 51, temperature 98.2 F (36.8 C), temperature source Oral,  resp. rate 20, height '5\' 1"'$  (1.549 m), weight 65.8 kg, SpO2 94 %. Body mass index is 27.4 kg/m.  Physical Exam Constitutional:      General: She is not in acute distress.    Appearance: She is well-developed.  HENT:     Head: Normocephalic and atraumatic.  Eyes:     Extraocular Movements: Extraocular movements intact.  Pulmonary:     Effort: Pulmonary effort is normal. No tachypnea or accessory muscle usage.  Skin:    General: Skin is warm and dry.     Findings: No rash.  Neurological:     General: No focal deficit present.     Mental Status: She is alert and oriented to person, place, and time.  Psychiatric:        Mood and Affect: Mood normal.        Behavior: Behavior normal.     Lab Results: Lab Results  Component Value Date   WBC 7.9 06/10/2021   HGB 11.0 (L) 06/10/2021   HCT 32.5 (L) 06/10/2021   MCV 91.5 06/10/2021   PLT 125 (L) 06/10/2021    Lab Results  Component Value Date   NA 135 06/10/2021   K 3.9 06/10/2021   CO2 29 06/10/2021   GLUCOSE 101 (H) 06/10/2021   BUN 16 06/10/2021   CREATININE 0.72 06/10/2021   CALCIUM 9.3 06/10/2021   GFRNONAA >60 06/10/2021   GFRAA 73  03/04/2021    Lab Results  Component Value Date   ALT 36 06/09/2021   AST 79 (H) 06/09/2021   ALKPHOS 51 06/09/2021   BILITOT 0.9 06/09/2021       Component Value Date/Time   CRP 2.6 (H) 06/09/2021 0332    No results found for: ESRSEDRATE   I have reviewed the micro and lab results in Epic.  Imaging: ECHOCARDIOGRAM COMPLETE  Result Date: 06/08/2021    ECHOCARDIOGRAM REPORT   Patient Name:   ZAKIYAH DASARI Date of Exam: 06/08/2021 Medical Rec #:  [id number]          Height:       61.0 in Accession #:    [id number]         Weight:       145.0 lb Date of Birth:  09/07/1946           BSA:          1.647 m Patient Age:    48 years           BP:           140/82 mmHg Patient Gender: F                  HR:           51 bpm. Exam Location:  Inpatient Procedure: 2D Echo, Cardiac Doppler  and Color Doppler Indications:    Bacteremia R78.81  History:        Patient has prior history of Echocardiogram examinations, most                 recent 10/11/2011. Signs/Symptoms:Murmur; Risk                 Factors:Hypertension and Dyslipidemia. COVID 19. Thyroid                 disease. GERD.  Sonographer:    Darlina Sicilian RDCS Referring Phys: [phone] A CALDWELL POWELL [place]  1. Left ventricular ejection fraction, by estimation, is 60 to 65%. The left ventricle has normal function. The left ventricle has no regional wall motion abnormalities. There is mild left ventricular hypertrophy. Left ventricular diastolic parameters are consistent with Grade II diastolic dysfunction (pseudonormalization). Elevated left atrial pressure.  2. Right ventricular systolic function is normal. The right ventricular size is normal. Tricuspid regurgitation signal is inadequate for assessing PA pressure.  3. Left atrial size was severely dilated.  4. The mitral valve is normal in structure. No evidence of mitral valve regurgitation. No evidence of mitral stenosis.  5. The aortic valve is tricuspid. Aortic valve regurgitation is not visualized. No aortic stenosis is present.  6. The inferior vena cava is normal in size with greater than 50% respiratory variability, suggesting right atrial pressure of 3 mmHg. Conclusion(s)/Recommendation(s): No vegetation seen. If high clinical suspicion for endocarditis, consider TEE. FINDINGS  Left Ventricle: Left ventricular ejection fraction, by estimation, is 60 to 65%. The left ventricle has normal function. The left ventricle has no regional wall motion abnormalities. The left ventricular internal cavity size was normal in size. There is  mild left ventricular hypertrophy. Left ventricular diastolic parameters are consistent with Grade II diastolic dysfunction (pseudonormalization). Elevated left atrial pressure. Right Ventricle: The right ventricular size is normal. No increase in  right ventricular wall thickness. Right ventricular systolic function is normal. Tricuspid regurgitation signal is inadequate for assessing PA pressure. Left Atrium: Left atrial size was severely dilated. Right Atrium: Right atrial size  was normal in size. Pericardium: Trivial pericardial effusion is present. Mitral Valve: The mitral valve is normal in structure. No evidence of mitral valve regurgitation. No evidence of mitral valve stenosis. Tricuspid Valve: The tricuspid valve is not well visualized. Tricuspid valve regurgitation is not demonstrated. Aortic Valve: The aortic valve is tricuspid. Aortic valve regurgitation is not visualized. No aortic stenosis is present. Pulmonic Valve: The pulmonic valve was not well visualized. Pulmonic valve regurgitation is trivial. Aorta: The aortic root and ascending aorta are structurally normal, with no evidence of dilitation. Venous: The inferior vena cava is normal in size with greater than 50% respiratory variability, suggesting right atrial pressure of 3 mmHg. IAS/Shunts: The interatrial septum was not well visualized.  LEFT VENTRICLE PLAX 2D LVIDd:         4.80 cm  Diastology LVIDs:         2.90 cm  LV e' medial:    3.91 cm/s LV PW:         1.00 cm  LV E/e' medial:  19.8 LV IVS:        1.00 cm  LV e' lateral:   4.54 cm/s LVOT diam:     1.90 cm  LV E/e' lateral: 17.1 LV SV:         64 LV SV Index:   39 LVOT Area:     2.84 cm  RIGHT VENTRICLE RV S prime:     12.20 cm/s TAPSE (M-mode): 2.0 cm LEFT ATRIUM              Index       RIGHT ATRIUM          Index LA diam:        3.50 cm  2.12 cm/m  RA Area:     9.75 cm LA Vol (A2C):   68.9 ml  41.82 ml/m RA Volume:   16.10 ml 9.77 ml/m LA Vol (A4C):   112.0 ml 67.98 ml/m LA Biplane Vol: 93.3 ml  56.63 ml/m  AORTIC VALVE LVOT Vmax:   126.00 cm/s LVOT Vmean:  70.300 cm/s LVOT VTI:    0.226 m  AORTA Ao Root diam: 2.80 cm Ao Asc diam:  2.80 cm MITRAL VALVE MV Area (PHT): 4.49 cm    SHUNTS MV Decel Time: 169 msec    Systemic  VTI:  0.23 m MV E velocity: 77.50 cm/s  Systemic Diam: 1.90 cm MV A velocity: 42.70 cm/s MV E/A ratio:  1.81 Oswaldo Milian MD Electronically signed by Oswaldo Milian MD Signature Date/Time: 06/08/2021/2:53:18 PM    Final      Imaging independently reviewed in Epic.    [facility] [phone] pager 06/10/2021, 8:43 AM

## 2021-06-10 NOTE — Plan of Care (Signed)

## 2021-06-12 LAB — CULTURE, BLOOD (ROUTINE X 2)
Culture: NO GROWTH
Culture: NO GROWTH
Special Requests: ADEQUATE

## 2021-06-15 ENCOUNTER — Telehealth: Payer: Self-pay

## 2021-06-15 NOTE — Telephone Encounter (Signed)
Called patient on 06/15/2021 , 12:26 PM in an attempt to reach the patient for a hospital follow up.   Admit date: 06/05/21 Discharge: 06/10/21   She does not have any questions or concerns about medications from the hospital admission. The patient's medications were reviewed over the phone, they were counseled to bring in all current medications to the hospital follow up visit.   I advised the patient to call if any questions or concerns arise about the hospital admission or medications    Home health was not started in the hospital.  All questions were answered and a follow up appointment was made.   Prior to Admission medications   Medication Sig Start Date End Date Taking? Authorizing Provider  alendronate (FOSAMAX) 70 MG tablet Take 1 tablet once weekly with a full glass of water on an empty stomach for 30 minutes. 07/08/20 07/08/21  [name], MD  Ascorbic Acid (VITAMIN C) 1000 MG tablet Take 1,000 mg by mouth daily.    [provider]  aspirin 81 MG tablet Take 81 mg by mouth daily.    [provider]  Cholecalciferol (VITAMIN D PO) Take 5,000 Units by mouth See admin instructions. Takes 3 times a week    [provider]  dexamethasone (DECADRON) 6 MG tablet Take 1 tablet (6 mg total) by mouth daily for 5 days. 06/10/21 06/15/21  Darliss Cheney, MD  folic acid (FOLVITE) 1 MG tablet Take 3 mg by mouth daily.    [provider]  gabapentin (NEURONTIN) 600 MG tablet TAKE 1/2 TO 1 TABLET 2 TO 3 TIMES DAILY AS NEEDED FOR PAIN 12/07/20   [name], MD  hydrochlorothiazide (HYDRODIURIL) 12.5 MG tablet Take  1 tablet  Daily  for BP & Fluid Retention 03/03/21   [name], MD  hydroxychloroquine (PLAQUENIL) 200 MG tablet Take 200 mg by mouth 2 (two) times daily.    [provider]  ibuprofen (IBU) 800 MG tablet TAKE 1 TABLET THREE TIMES DAILY WITH FOOD AS NEEDED FOR PAIN AND INFLAMMATION 02/11/21   Liane Comber, NP  levothyroxine (SYNTHROID)  137 MCG tablet TAKE 1 TAB DAILY ON AN EMPTY STOMACH WITH ONLY WATER X30 MIN, NO ANTACID MEDS, CALCIUM OR MAGNESIUM X4 HOURS. AVOID BIOTIN 02/11/21   Liane Comber, NP  MELATONIN PO Take 1 tablet by mouth at bedtime.    [provider]  methotrexate 50 MG/2ML injection Inject 25 mg into the skin once a week. Wednesdays 04/16/21   [provider]  olmesartan (BENICAR) 20 MG tablet TAKE 1 TABLET EVERY DAY FOR BLOOD PRESSURE 02/26/21   Liane Comber, NP  omeprazole (PRILOSEC) 40 MG capsule TAKE 1 CAPSULE TWICE DAILY FOR ACID REFLUX 11/26/18   [name], MD  traZODone (DESYREL) 150 MG tablet Take  1 tablet  1 hour  before Bedtime  as needed for Sleep 03/03/21   [name], MD  zinc gluconate 50 MG tablet Take 50 mg by mouth daily.    [provider]

## 2021-06-17 ENCOUNTER — Other Ambulatory Visit: Payer: Self-pay

## 2021-06-17 ENCOUNTER — Ambulatory Visit: Payer: Medicare PPO | Admitting: Internal Medicine

## 2021-06-17 ENCOUNTER — Encounter: Payer: Self-pay | Admitting: Internal Medicine

## 2021-06-17 VITALS — BP 110/68 | HR 56 | Temp 97.1°F | Resp 16 | Ht 61.0 in | Wt 150.2 lb

## 2021-06-17 DIAGNOSIS — U071 COVID-19: Secondary | ICD-10-CM | POA: Diagnosis not present

## 2021-06-17 DIAGNOSIS — M25561 Pain in right knee: Secondary | ICD-10-CM

## 2021-06-17 DIAGNOSIS — E782 Mixed hyperlipidemia: Secondary | ICD-10-CM

## 2021-06-17 DIAGNOSIS — M25562 Pain in left knee: Secondary | ICD-10-CM

## 2021-06-17 DIAGNOSIS — Z79899 Other long term (current) drug therapy: Secondary | ICD-10-CM | POA: Diagnosis not present

## 2021-06-17 DIAGNOSIS — E871 Hypo-osmolality and hyponatremia: Secondary | ICD-10-CM | POA: Diagnosis not present

## 2021-06-17 DIAGNOSIS — E039 Hypothyroidism, unspecified: Secondary | ICD-10-CM | POA: Diagnosis not present

## 2021-06-17 DIAGNOSIS — E860 Dehydration: Secondary | ICD-10-CM | POA: Diagnosis not present

## 2021-06-17 DIAGNOSIS — I10 Essential (primary) hypertension: Secondary | ICD-10-CM | POA: Diagnosis not present

## 2021-06-17 DIAGNOSIS — M069 Rheumatoid arthritis, unspecified: Secondary | ICD-10-CM | POA: Diagnosis not present

## 2021-06-17 NOTE — Patient Instructions (Signed)
Person Under Monitoring Name: [name]  Location: [place] [address]   Infection Prevention Recommendations for Individuals Confirmed to have, or Being Evaluated for, 2019 Novel Coronavirus (COVID-19) Infection Who Receive Care at Home  Individuals who are confirmed to have, or are being evaluated for, COVID-19 should follow the prevention steps below until a healthcare provider or local or state health department says they can return to normal activities.  Stay home except to get medical care You should restrict activities outside your home, except for getting medical care. Do not go to work, school, or public areas, and do not use public transportation or taxis.  Call ahead before visiting your doctor Before your medical appointment, call the healthcare provider and tell them that you have, or are being evaluated for, COVID-19 infection. This will help the healthcare provider's office take steps to keep other people from getting infected. Ask your healthcare provider to call the local or state health department.  Monitor your symptoms Seek prompt medical attention if your illness is worsening (e.g., difficulty breathing). Before going to your medical appointment, call the healthcare provider and tell them that you have, or are being evaluated for, COVID-19 infection. Ask your healthcare provider to call the local or state health department.  Wear a facemask You should wear a facemask that covers your nose and mouth when you are in the same room with other people and when you visit a healthcare provider. People who live with or visit you should also wear a facemask while they are in the same room with you.  Separate yourself from other people in your home As much as possible, you should stay in a different room from other people in your home. Also, you should use a separate bathroom, if available.  Avoid sharing household items You should not  share dishes, drinking glasses, cups, eating utensils, towels, bedding, or other items with other people in your home. After using these items, you should wash them thoroughly with soap and water.  Cover your coughs and sneezes Cover your mouth and nose with a tissue when you cough or sneeze, or you can cough or sneeze into your sleeve. Throw used tissues in a lined trash can, and immediately wash your hands with soap and water for at least 20 seconds or use an alcohol-based hand rub.  Wash your [facility] your hands often and thoroughly with soap and water for at least 20 seconds. You can use an alcohol-based hand sanitizer if soap and water are not available and if your hands are not visibly dirty. Avoid touching your eyes, nose, and mouth with unwashed hands.   Prevention Steps for Caregivers and Household Members of Individuals Confirmed to have, or Being Evaluated for, COVID-19 Infection Being Cared for in the Home  If you live with, or provide care at home for, a person confirmed to have, or being evaluated for, COVID-19 infection please follow these guidelines to prevent infection:  Follow healthcare provider's instructions Make sure that you understand and can help the patient follow any healthcare provider instructions for all care.  Provide for the patient's basic needs You should help the patient with basic needs in the home and provide support for getting groceries, prescriptions, and other personal needs.  Monitor the patient's symptoms If they are getting sicker, call his or her medical provider and tell them that the patient has, or is being evaluated for, COVID-19 infection. This will help the healthcare provider's  office take steps to keep other people from getting infected. Ask the healthcare provider to call the local or state health department.  Limit the number of people who have contact with the patient If possible, have only one caregiver for the  patient. Other household members should stay in another home or place of residence. If this is not possible, they should stay in another room, or be separated from the patient as much as possible. Use a separate bathroom, if available. Restrict visitors who do not have an essential need to be in the home.  Keep older adults, very young children, and other sick people away from the patient Keep older adults, very young children, and those who have compromised immune systems or chronic health conditions away from the patient. This includes people with chronic heart, lung, or kidney conditions, diabetes, and cancer.  Ensure good ventilation Make sure that shared spaces in the home have good air flow, such as from an air conditioner or an opened window, weather permitting.  Wash your hands often Wash your hands often and thoroughly with soap and water for at least 20 seconds. You can use an alcohol based hand sanitizer if soap and water are not available and if your hands are not visibly dirty. Avoid touching your eyes, nose, and mouth with unwashed hands. Use disposable paper towels to dry your hands. If not available, use dedicated cloth towels and replace them when they become wet.  Wear a facemask and gloves Wear a disposable facemask at all times in the room and gloves when you touch or have contact with the patient's blood, body fluids, and/or secretions or excretions, such as sweat, saliva, sputum, nasal mucus, vomit, urine, or feces.  Ensure the mask fits over your nose and mouth tightly, and do not touch it during use. Throw out disposable facemasks and gloves after using them. Do not reuse. Wash your hands immediately after removing your facemask and gloves. If your personal clothing becomes contaminated, carefully remove clothing and launder. Wash your hands after handling contaminated clothing. Place all used disposable facemasks, gloves, and other waste in a lined container before  disposing them with other household waste. Remove gloves and wash your hands immediately after handling these items.  Do not share dishes, glasses, or other household items with the patient Avoid sharing household items. You should not share dishes, drinking glasses, cups, eating utensils, towels, bedding, or other items with a patient who is confirmed to have, or being evaluated for, COVID-19 infection. After the person uses these items, you should wash them thoroughly with soap and water.  Wash laundry thoroughly Immediately remove and wash clothes or bedding that have blood, body fluids, and/or secretions or excretions, such as sweat, saliva, sputum, nasal mucus, vomit, urine, or feces, on them. Wear gloves when handling laundry from the patient. Read and follow directions on labels of laundry or clothing items and detergent. In general, wash and dry with the warmest temperatures recommended on the label.  Clean all areas the individual has used often Clean all touchable surfaces, such as counters, tabletops, doorknobs, bathroom fixtures, toilets, phones, keyboards, tablets, and bedside tables, every day. Also, clean any surfaces that may have blood, body fluids, and/or secretions or excretions on them. Wear gloves when cleaning surfaces the patient has come in contact with. Use a diluted bleach solution (e.g., dilute bleach with 1 part bleach and 10 parts water) or a household disinfectant with a label that says EPA-registered for coronaviruses. To make a  bleach solution at home, add 1 tablespoon of bleach to 1 quart (4 cups) of water. For a larger supply, add  cup of bleach to 1 gallon (16 cups) of water. Read labels of cleaning products and follow recommendations provided on product labels. Labels contain instructions for safe and effective use of the cleaning product including precautions you should take when applying the product, such as wearing gloves or eye protection and making sure you  have good ventilation during use of the product. Remove gloves and wash hands immediately after cleaning.  Monitor yourself for signs and symptoms of illness Caregivers and household members are considered close contacts, should monitor their health, and will be asked to limit movement outside of the home to the extent possible. Follow the monitoring steps for close contacts listed on the symptom monitoring form.   ? If you have additional questions, contact your local health department or call the epidemiologist on call at [phone] (available 24/7). ? This guidance is subject to change. For the most up-to-date guidance from [facility], please refer to their website: [email or web address]

## 2021-06-17 NOTE — Progress Notes (Signed)
Future Appointments  Date Time Provider [place]  07/21/2021 10:40 AM Donato Heinz, MD [facility]  07/22/2021 10:00 AM [name], MD [facility] None    [place]     This very nice 75 y.o. MWF was admitted to the hospital on 06/05/2021  and patient was discharged from the hospital on  06/10/2021. The patient now presents 7 days post hospital discharge for follow up for transition from recent hospitalization   The day after discharge  our clinical staff contacted the patient to assure stability and schedule a follow up appointment. The discharge summary, medications and diagnostic test results were reviewed before meeting with the patient. The patient was admitted for:    COVID-19  Dehydration Essential hypertension  Rheumatoid arthritis Hyperlipidemia, mixed Hypothyroidism Hyponatremia                                          This very nice 75 y.o. female with history of rheumatoid arthritis on methotrexate and Plaquenil, hypertension, hypothyroidism presents to the ER with a 5 day prodrome of worsening fatigue and was dx'd with Covid. Patient was admitted for IV rehydration and after admission, her O2 sat's dropped in the high 80's.  As her condition improved & she was weaned off of oxygen and activity increased  after patient was rehydrated and clinically improve , she was felt stable for discharge and out-patient follow-up.  She was treated with Remdesivir x 5b doses and also was on Vancomycin for (+) positive blood cultures until Infectious Dz consultant determined the cultures contaminants and d/c'd the Vancomycin.      Husband also reports that she has developed a tendency when standing for her knees to "buckle & give way" and he has to "catch" her to prevent falls to the floor .       Hospitalization discharge instructions and medications are reconciled with the patient & spouse.      Patient is also followed with Hypertension,  Hyperlipidemia, Pre-Diabetes and Vitamin D Deficiency.      Patient is treated for HTN & BP has been controlled at home. Today's BP: 110/68. Patient has had no complaints of any cardiac type chest pain, palpitations, dyspnea/orthopnea/PND, dizziness, claudication, or dependent edema.       Also, the patient has history of PreDiabetes (A1c 5.8% /2012) and has had no symptoms of reactive hypoglycemia, diabetic polys, paresthesias or visual blurring.  Last A1c was normal & at goal:  Lab Results  Component Value Date   HGBA1C 5.1 01/21/2021      Further, the patient also has history of Vitamin D Deficiency and supplements vitamin D without any suspected side-effects. Last vitamin D was  at goal: Lab Results  Component Value Date   VD25OH 92 01/21/2021   Current Outpatient Medications on File Prior to Visit  Medication Sig   alendronate (FOSAMAX) 70 MG tablet Take 1 tablet once weekly with a full glass of water on an empty stomach for 30 minutes.   Ascorbic Acid (VITAMIN C) 1000 MG tablet Take 1,000 mg by mouth daily.   aspirin 81 MG tablet Take 81 mg by mouth daily.   Cholecalciferol (VITAMIN D PO) Take 5,000 Units by mouth See admin instructions. Takes 3 times a week   folic acid (FOLVITE) 1 MG tablet Take 3 mg by mouth daily.   gabapentin (NEURONTIN) 600 MG tablet TAKE 1/2  TO 1 TABLET 2 TO 3 TIMES DAILY AS NEEDED FOR PAIN   hydrochlorothiazide (HYDRODIURIL) 12.5 MG tablet Take  1 tablet  Daily  for BP & Fluid Retention   hydroxychloroquine (PLAQUENIL) 200 MG tablet Take 200 mg by mouth 2 (two) times daily.   ibuprofen (IBU) 800 MG tablet TAKE 1 TABLET THREE TIMES DAILY WITH FOOD AS NEEDED FOR PAIN AND INFLAMMATION   levothyroxine (SYNTHROID) 137 MCG tablet TAKE 1 TAB DAILY ON AN EMPTY STOMACH WITH ONLY WATER X30 MIN, NO ANTACID MEDS, CALCIUM OR MAGNESIUM X4 HOURS. AVOID BIOTIN   MELATONIN PO Take 1 tablet by mouth at bedtime.   methotrexate 50 MG/2ML injection Inject 25 mg into the skin  once a week. Wednesdays   olmesartan (BENICAR) 20 MG tablet TAKE 1 TABLET EVERY DAY FOR BLOOD PRESSURE   omeprazole (PRILOSEC) 40 MG capsule TAKE 1 CAPSULE TWICE DAILY FOR ACID REFLUX   traZODone (DESYREL) 150 MG tablet Take  1 tablet  1 hour  before Bedtime  as needed for Sleep   zinc gluconate 50 MG tablet Take 50 mg by mouth daily.   No current facility-administered medications on file prior to visit.   Allergies  Allergen Reactions   Ace Inhibitors Cough   Ambien [Zolpidem] Other (See Comments)    Odd Feeling   Crestor [Rosuvastatin]    Pravastatin    Prozac [Fluoxetine Hcl] Other (See Comments)    Decreased libido   Zoloft [Sertraline Hcl]    PMHx:   Past Medical History:  Diagnosis Date   Anemia    Hx of    Arthritis    Calculus of bile duct without mention of cholecystitis or obstruction    Cataract    GERD (gastroesophageal reflux disease)    H. pylori infection    Hx of    Heart murmur    Hyperlipidemia    Hypertension    Hypothyroid    PUD (peptic ulcer disease)    Rheumatoid arthritis(714.0)    Immunization History  Administered Date(s) Administered   Influenza, High Dose Seasonal PF 07/16/2014, 09/17/2015, 07/01/2016, 08/04/2017, 08/25/2018   Pneumococcal Conjugate-13 10/16/2015   Pneumococcal Polysaccharide-23 11/08/2012   Td 11/08/2012   Zoster, Live 09/17/2015   Past Surgical History:  Procedure Laterality Date   AV FISTULA REPAIR     BLADDER SUSPENSION     BREAST CYST EXCISION Left    BUNIONECTOMY     CARPAL TUNNEL RELEASE     CHOLECYSTECTOMY  09/07/2011   CYSTECTOMY     left breast   ERCP  09/29/2011   Procedure: ENDOSCOPIC RETROGRADE CHOLANGIOPANCREATOGRAPHY (ERCP);  Surgeon: Inda Castle, MD;  Location: [facility];  Service: Endoscopy;  Laterality: N/A;   PILONIDAL CYST EXCISION     TENOLYSIS  10/26/2011   Procedure: TENDON SHEATH RELEASE/TENOLYSIS;  Surgeon: Wynonia Sours, MD;  Location: [facility];  Service:  Orthopedics;  Laterality: Right;  tenosynovectomy removal superficialis slip right index finger   TONSILLECTOMY     TOTAL SHOULDER REPLACEMENT Right 02/22/2014   Dr. Enrigue Catena at [place]     ovaries not removed   FHx:    Reviewed / unchanged  SHx:    Reviewed / unchanged  Systems Review:  Constitutional: Denies fever, chills, wt changes, headaches, insomnia, fatigue, night sweats, change in appetite. Eyes: Denies redness, blurred vision, diplopia, discharge, itchy, watery eyes.  ENT: Denies discharge, congestion, post nasal drip, epistaxis, sore throat,  earache, hearing loss, dental pain, tinnitus, vertigo, sinus pain, snoring.  CV: Denies chest pain, palpitations, irregular heartbeat, syncope, dyspnea, diaphoresis, orthopnea, PND, claudication or edema. Respiratory: denies cough, dyspnea, DOE, pleurisy, hoarseness, laryngitis, wheezing.  Gastrointestinal: Denies dysphagia, odynophagia, heartburn, reflux, water brash, abdominal pain or cramps, nausea, vomiting, bloating, diarrhea, constipation, hematemesis, melena, hematochezia  or hemorrhoids. Genitourinary: Denies dysuria, frequency, urgency, nocturia, hesitancy, discharge, hematuria or flank pain. Musculoskeletal: Denies arthralgias, myalgias, stiffness, jt. swelling, pain, limping or strain/sprain.  Skin: Denies pruritus, rash, hives, warts, acne, eczema or change in skin lesion(s). Neuro: No weakness, tremor, incoordination, spasms, paresthesia or pain. Psychiatric: Denies confusion, memory loss or sensory loss. Endo: Denies change in weight, skin or hair change.  Heme/Lymph: No excessive bleeding, bruising or enlarged lymph nodes.  Physical Exam  BP 110/68   Pulse (!) 56   Temp (!) 97.1 F (36.2 C)   Resp 16   Ht '5\' 1"'$  (1.549 m)   Wt 150 lb 3.2 oz (68.1 kg)   SpO2 99%   BMI 28.38 kg/m   Appears over nourished and in no distress.  Eyes: PERRLA, EOMs, conjunctiva no  swelling or erythema. Sinuses: No frontal/maxillary tenderness ENT/Mouth: EAC's clear, TM's nl w/o erythema, bulging. Nares clear w/o erythema, swelling, exudates. Oropharynx clear without erythema or exudates. Oral hygiene is good. Tongue normal, non obstructing. Hearing intact.  Neck: Supple. Thyroid nl. Car 2+/2+ without bruits, nodes or JVD. Chest: Respirations nl with BS clear & equal w/o rales, rhonchi, wheezing or stridor.  Cor: Heart sounds normal w/ regular rate and rhythm without sig. murmurs, gallops, clicks or rubs. Peripheral pulses normal and equal  without edema.  Abdomen: Soft & bowel sounds normal. Non-tender w/o guarding, rebound, hernias, masses or organomegaly.  Lymphatics: Unremarkable.  Musculoskeletal: Full ROM all peripheral extremities, joint stability, 5/5 strength and normal gait.  Skin: Warm, dry without exposed rashes, lesions or ecchymosis apparent.  Neuro: Cranial nerves intact, reflexes equal bilaterally. Sensory-motor testing grossly intact. Tendon reflexes grossly intact.  Pysch: Alert & oriented x 3.  Insight and judgement nl & appropriate. No ideations.       Discussed  regular exercise, BP monitoring, weight control to achieve/maintain BMI less than 25 and discussed meds and SE's. Recommended labs to assess and monitor clinical status with further disposition pending results of labs. Over 30 minutes of exam, counseling, chart review was performed.   Kirtland Bouchard, MD

## 2021-06-18 LAB — COMPLETE METABOLIC PANEL WITH GFR
AG Ratio: 1.9 (calc) (ref 1.0–2.5)
ALT: 33 U/L — ABNORMAL HIGH (ref 6–29)
AST: 36 U/L — ABNORMAL HIGH (ref 10–35)
Albumin: 3.3 g/dL — ABNORMAL LOW (ref 3.6–5.1)
Alkaline phosphatase (APISO): 58 U/L (ref 37–153)
BUN: 10 mg/dL (ref 7–25)
CO2: 31 mmol/L (ref 20–32)
Calcium: 9.6 mg/dL (ref 8.6–10.4)
Chloride: 97 mmol/L — ABNORMAL LOW (ref 98–110)
Creat: 0.86 mg/dL (ref 0.60–1.00)
Globulin: 1.7 g/dL (calc) — ABNORMAL LOW (ref 1.9–3.7)
Glucose, Bld: 109 mg/dL — ABNORMAL HIGH (ref 65–99)
Potassium: 4.3 mmol/L (ref 3.5–5.3)
Sodium: 135 mmol/L (ref 135–146)
Total Bilirubin: 1 mg/dL (ref 0.2–1.2)
Total Protein: 5 g/dL — ABNORMAL LOW (ref 6.1–8.1)
eGFR: 71 mL/min/{1.73_m2} (ref >=60)

## 2021-06-18 LAB — CBC WITH DIFFERENTIAL/PLATELET
Absolute Monocytes: 466 cells/uL (ref 200–950)
Basophils Absolute: 19 cells/uL (ref 0–200)
Basophils Relative: 0.2 %
Eosinophils Absolute: 29 cells/uL (ref 15–500)
Eosinophils Relative: 0.3 %
HCT: 37.1 % (ref 35.0–45.0)
Hemoglobin: 12.5 g/dL (ref 11.7–15.5)
Lymphs Abs: 1349 cells/uL (ref 850–3900)
MCH: 31.5 pg (ref 27.0–33.0)
MCHC: 33.7 g/dL (ref 32.0–36.0)
MCV: 93.5 fL (ref 80.0–100.0)
MPV: 12.2 fL (ref 7.5–12.5)
Monocytes Relative: 4.9 %
Neutro Abs: 7638 cells/uL (ref 1500–7800)
Neutrophils Relative %: 80.4 %
Platelets: 174 10*3/uL (ref 140–400)
RBC: 3.97 10*6/uL (ref 3.80–5.10)
RDW: 14.1 % (ref 11.0–15.0)
Total Lymphocyte: 14.2 %
WBC: 9.5 10*3/uL (ref 3.8–10.8)

## 2021-06-20 NOTE — Progress Notes (Signed)
============================================================ -   Test results slightly outside the reference range are not unusual. If there is anything important, I will review this with you,  otherwise it is considered normal test values.  If you have further questions,  please do not hesitate to contact me at the office or via My Chart.  ============================================================ ============================================================  -  CBC shows anemia corrected & back in Normal range  ============================================================ ============================================================  -  Liver enzymes are still slightly elevated  (but better from previous)  Most likely this is due to "Fatty Liver " and the only treat ment is Weight Loss ! ============================================================ ============================================================  -  All Else - CBC - Kidneys - Electrolytes - Magnesium & Thyroid    - all  Normal / OK ===========================================================

## 2021-06-30 DIAGNOSIS — M170 Bilateral primary osteoarthritis of knee: Secondary | ICD-10-CM | POA: Diagnosis not present

## 2021-07-03 ENCOUNTER — Encounter: Payer: Self-pay | Admitting: Adult Health

## 2021-07-03 ENCOUNTER — Ambulatory Visit (INDEPENDENT_AMBULATORY_CARE_PROVIDER_SITE_OTHER): Payer: Medicare PPO | Admitting: Adult Health

## 2021-07-03 ENCOUNTER — Ambulatory Visit (HOSPITAL_COMMUNITY)
Admission: RE | Admit: 2021-07-03 | Discharge: 2021-07-03 | Disposition: A | Discharge status: Home / Self Care | Payer: Medicare PPO | Source: Ambulatory Visit | Attending: Adult Health | Admitting: Adult Health

## 2021-07-03 ENCOUNTER — Other Ambulatory Visit: Payer: Self-pay

## 2021-07-03 VITALS — BP 142/82 | HR 43 | Temp 97.2°F | Wt 146.0 lb

## 2021-07-03 DIAGNOSIS — R059 Cough, unspecified: Secondary | ICD-10-CM | POA: Diagnosis not present

## 2021-07-03 DIAGNOSIS — R053 Chronic cough: Secondary | ICD-10-CM | POA: Diagnosis not present

## 2021-07-03 DIAGNOSIS — U071 COVID-19: Secondary | ICD-10-CM

## 2021-07-03 MED ORDER — PREDNISONE 20 MG PO TABS: ORAL_TABLET | ORAL | 0 refills | Status: DC

## 2021-07-03 MED ORDER — PROMETHAZINE-DM 6.25-15 MG/5ML PO SYRP: 5.0000 mL | ORAL_SOLUTION | Freq: Four times a day (QID) | ORAL | 1 refills | Status: DC | PRN

## 2021-07-03 NOTE — Progress Notes (Signed)
Assessment and Plan:  Diagnoses and all orders for this visit:  COVID-19 Persistent cough See notes for recent covid 19 admission Persistent cough/congesion, check STAT CXR at [facility] to r/o superimposed pneumonia, husband will transport, add abx if needed Treat for post-viral cough; follow up next week with progress If persistent plan to check lung CT, has smoking hx and family hx of lung CA Go to the ER if any chest pain, shortness of breath, nausea, dizziness, severe HA, changes vision/speech -     DG Chest 2 View; Future -     promethazine-dextromethorphan (PROMETHAZINE-DM) 6.25-15 MG/5ML syrup; Take 5 mLs by mouth 4 (four) times daily as needed for cough. -     predniSONE (DELTASONE) 20 MG tablet; 2 tablets daily for 3 days, 1 tablet daily for 4 days.  Further disposition pending results of labs. Discussed med's effects and SE's.   Over 30 minutes of exam, counseling, chart review, and critical decision making was performed.   Future Appointments  Date Time Provider [place]  07/21/2021 10:40 AM Donato Heinz, MD [facility]  07/22/2021 10:00 AM [name], MD [facility] None    ------------------------------------------------------------------------------------------------------------------   HPI BP (!) 142/82   Pulse (!) 43   Temp (!) 97.2 F (36.2 C)   Wt 146 lb (66.2 kg)   SpO2 99%   BMI 27.59 kg/m  75 y.o.female remote smote (quit 1996), RA on methotrexate/plaquenil and recent covid 19 admission presents for follow up with persistent cough.   She was admitted 06/05/2021 with covid 19, admitted for 5 days, underwent underwent remdesivir infusions x 5  and was discharged without oyygen. She reports has had persistent cough, "sounds junky like the crud" worse at night lying supine. Non-productive. Denies wheezing. Endorses some chest aching after coughing fits. Denies dyspnea. Denies fever/chills.    She reports has been monitoring oxygen -  95%+, temp has been normal  She reports has been gargling salt water, taking mucinex, taking OTC cough syrup.   She has had intermittent bradycardia, syncope intermittent predating covid 45 for several months, reports is scheduled to see cardiology for this early Sept. Discussed possible pacemaker. Denies dyspnea, tachycardia.      Immunization History  Administered Date(s) Administered   Influenza, High Dose Seasonal PF 07/16/2014, 09/17/2015, 07/01/2016, 08/04/2017, 08/25/2018   Pneumococcal Conjugate-13 10/16/2015   Pneumococcal Polysaccharide-23 11/08/2012   Td 11/08/2012   Zoster, Live 09/17/2015    Past Medical History:  Diagnosis Date   Anemia    Hx of    Arthritis    Calculus of bile duct without mention of cholecystitis or obstruction    Cataract    GERD (gastroesophageal reflux disease)    H. pylori infection    Hx of    Heart murmur    Hyperlipidemia    Hypertension    Hypothyroid    PUD (peptic ulcer disease)    Rheumatoid arthritis(714.0)      Allergies  Allergen Reactions   Ace Inhibitors Cough   Ambien [Zolpidem] Other (See Comments)    Odd Feeling   Crestor [Rosuvastatin]    Pravastatin    Prozac [Fluoxetine Hcl] Other (See Comments)    Decreased libido   Zoloft [Sertraline Hcl]     Current Outpatient Medications on File Prior to Visit  Medication Sig   alendronate (FOSAMAX) 70 MG tablet Take 1 tablet once weekly with a full glass of water on an empty stomach for 30 minutes.   Ascorbic Acid (VITAMIN C) 1000 MG  tablet Take 1,000 mg by mouth daily.   aspirin 81 MG tablet Take 81 mg by mouth daily.   Cholecalciferol (VITAMIN D PO) Take 5,000 Units by mouth See admin instructions. Takes 3 times a week   folic acid (FOLVITE) 1 MG tablet Take 3 mg by mouth daily.   gabapentin (NEURONTIN) 600 MG tablet TAKE 1/2 TO 1 TABLET 2 TO 3 TIMES DAILY AS NEEDED FOR PAIN   hydrochlorothiazide (HYDRODIURIL) 12.5 MG tablet Take  1 tablet  Daily  for BP & Fluid  Retention   hydroxychloroquine (PLAQUENIL) 200 MG tablet Take 200 mg by mouth 2 (two) times daily.   ibuprofen (IBU) 800 MG tablet TAKE 1 TABLET THREE TIMES DAILY WITH FOOD AS NEEDED FOR PAIN AND INFLAMMATION   levothyroxine (SYNTHROID) 137 MCG tablet TAKE 1 TAB DAILY ON AN EMPTY STOMACH WITH ONLY WATER X30 MIN, NO ANTACID MEDS, CALCIUM OR MAGNESIUM X4 HOURS. AVOID BIOTIN   MELATONIN PO Take 1 tablet by mouth at bedtime.   methotrexate 50 MG/2ML injection Inject 25 mg into the skin once a week. Wednesdays   olmesartan (BENICAR) 20 MG tablet TAKE 1 TABLET EVERY DAY FOR BLOOD PRESSURE   omeprazole (PRILOSEC) 40 MG capsule TAKE 1 CAPSULE TWICE DAILY FOR ACID REFLUX   traZODone (DESYREL) 150 MG tablet Take  1 tablet  1 hour  before Bedtime  as needed for Sleep   zinc gluconate 50 MG tablet Take 50 mg by mouth daily.   No current facility-administered medications on file prior to visit.    Allergies:  Allergies  Allergen Reactions   Ace Inhibitors Cough   Ambien [Zolpidem] Other (See Comments)    Odd Feeling   Crestor [Rosuvastatin]    Pravastatin    Prozac [Fluoxetine Hcl] Other (See Comments)    Decreased libido   Zoloft [Sertraline Hcl]    Family History:  Herfamily history includes Breast cancer in her paternal aunt; Colon cancer in her maternal grandfather; Heart attack in her brother and father; Lung cancer in her mother; Prostate cancer in her paternal grandfather; Stomach cancer in her father. Social History:   reports that she quit smoking about 25 years ago. Her smoking use included cigarettes. She has never used smokeless tobacco. She reports current alcohol use. She reports that she does not use drugs.   ROS: all negative except above.   Physical Exam:  BP (!) 142/82   Pulse (!) 43   Temp (!) 97.2 F (36.2 C)   Wt 146 lb (66.2 kg)   SpO2 99%   BMI 27.59 kg/m   General Appearance: Well nourished, well dressed elder female in no apparent distress. Eyes: PERRLA,  EOMs, conjunctiva no swelling or erythema Sinuses: Mild generalized Frontal/maxillary tenderness ENT/Mouth: Ext aud canals clear, TMs without erythema, bulging. No erythema, swelling, or exudate on post pharynx.  Tonsils not swollen or erythematous. Hearing normal.  Neck: Supple, thyroid normal.  Respiratory: Respiratory effort normal, BS present throughout without  rhonchi, wheezing or stridor. R upper anterior/lateral with rales.  Cardio: Regular rhythm, brady with no MRGs. Brisk peripheral pulses without edema.  Abdomen: Soft, + BS.  Non tender, no guarding, rebound, hernias, masses. Lymphatics: Non tender without lymphadenopathy.  Musculoskeletal: normal gait.  Skin: Warm, dry without rashes, lesions, ecchymosis.  Neuro: Normal muscle tone Psych: Awake and oriented X 3, normal affect, Insight and Judgment appropriate.    Izora Ribas, NP 11:11 AM [facility]

## 2021-07-08 ENCOUNTER — Other Ambulatory Visit: Payer: Self-pay | Admitting: Internal Medicine

## 2021-07-08 DIAGNOSIS — I10 Essential (primary) hypertension: Secondary | ICD-10-CM

## 2021-07-15 DIAGNOSIS — Z79899 Other long term (current) drug therapy: Secondary | ICD-10-CM | POA: Diagnosis not present

## 2021-07-15 DIAGNOSIS — M0609 Rheumatoid arthritis without rheumatoid factor, multiple sites: Secondary | ICD-10-CM | POA: Diagnosis not present

## 2021-07-15 DIAGNOSIS — R5383 Other fatigue: Secondary | ICD-10-CM | POA: Diagnosis not present

## 2021-07-19 NOTE — Progress Notes (Signed)
Cardiology Office Note:    Date:  07/22/2021   ID:  [name], [name] 05/20/1946, MRN [id number]  PCP:  [name], MD  Cardiologist:  None  Electrophysiologist:  None   Referring MD: [name], MD   Chief Complaint  Patient presents with   Loss of Consciousness     History of Present Illness:    [name] is a 75 y.o. female with a hx of hypertension, hyperlipidemia, hypothyroidism, peptic ulcer disease, rheumatoid arthritis who is referred by Dr. Melford Aase for evaluation of syncope.  Previously followed with Dr. Stanford Breed, last seen 07/2015.  Has been referred for evaluation of syncope.  Echocardiogram 10/2011 showed normal LV function, grade 1 diastolic dysfunction, mild MR.  Renal Dopplers in November of 2013 showed no renal artery stenosis. Holter monitor in December of 2014 showed sinus rhythm with occasional PACs.  Syncopal episode thought to be orthostatic hypotension.  Hydrochlorothiazide was discontinued.  Reported chest pain and underwent Lexiscan Myoview on 07/29/2015, which showed normal perfusion, EF 64%.  She was admitted to [facility] with COVID-19 infection on 06/10/2021.  Also noted to have staph epi bacteremia, felt to be contaminant per ID.  Echocardiogram 06/08/2021 showed normal biventricular function, grade 2 diastolic dysfunction, severe left atrial dilatation, no significant valvular disease.  Reports recently has been having syncope at least once per week.  Episodes occur with position change.  Reports last episode occurred when standing from chair and walking to the kitchen, reports she passed out suddenly.  Unconscious for only a few seconds.  Does report heart rate has dropped as low as 40s.  Denies any chest pain, dyspnea, lower extremity edema, or palpitations.    Past Medical History:  Diagnosis Date   Anemia    Hx of    Arthritis    Calculus of bile duct without mention of cholecystitis or obstruction    Cataract    GERD (gastroesophageal  reflux disease)    H. pylori infection    Hx of    Heart murmur    Hyperlipidemia    Hypertension    Hypothyroid    PUD (peptic ulcer disease)    Rheumatoid arthritis(714.0)     Past Surgical History:  Procedure Laterality Date   AV FISTULA REPAIR     BLADDER SUSPENSION     BREAST CYST EXCISION Left    BUNIONECTOMY     CARPAL TUNNEL RELEASE     CHOLECYSTECTOMY  09/07/2011   CYSTECTOMY     left breast   ERCP  09/29/2011   Procedure: ENDOSCOPIC RETROGRADE CHOLANGIOPANCREATOGRAPHY (ERCP);  Surgeon: Inda Castle, MD;  Location: [facility];  Service: Endoscopy;  Laterality: N/A;   PILONIDAL CYST EXCISION     TENOLYSIS  10/26/2011   Procedure: TENDON SHEATH RELEASE/TENOLYSIS;  Surgeon: Wynonia Sours, MD;  Location: [facility];  Service: Orthopedics;  Laterality: Right;  tenosynovectomy removal superficialis slip right index finger   TONSILLECTOMY     TOTAL SHOULDER REPLACEMENT Right 02/22/2014   Dr. Enrigue Catena at [place]     ovaries not removed    Current Medications: Current Meds  Medication Sig   Ascorbic Acid (VITAMIN C) 1000 MG tablet Take 1,000 mg by mouth daily.   aspirin 81 MG tablet Take 81 mg by mouth daily.   Cholecalciferol (VITAMIN D PO) Take 5,000 Units by mouth See admin instructions. Takes 3 times a week   folic acid (FOLVITE) 1  MG tablet Take 3 mg by mouth daily.   gabapentin (NEURONTIN) 600 MG tablet TAKE 1/2 TO 1 TABLET 2 TO 3 TIMES DAILY AS NEEDED FOR PAIN   hydroxychloroquine (PLAQUENIL) 200 MG tablet Take 200 mg by mouth 2 (two) times daily.   levothyroxine (SYNTHROID) 137 MCG tablet TAKE 1 TAB DAILY ON AN EMPTY STOMACH WITH ONLY WATER X30 MIN, NO ANTACID MEDS, CALCIUM OR MAGNESIUM X4 HOURS. AVOID BIOTIN   MELATONIN PO Take 1 tablet by mouth at bedtime.   methotrexate 50 MG/2ML injection Inject 25 mg into the skin once a week. Thursdays   olmesartan (BENICAR) 20 MG tablet TAKE 1 TABLET  EVERY DAY FOR BLOOD PRESSURE   omeprazole (PRILOSEC) 40 MG capsule TAKE 1 CAPSULE TWICE DAILY FOR ACID REFLUX   traZODone (DESYREL) 150 MG tablet Take  1 tablet  1 hour  before Bedtime  as needed for Sleep   zinc gluconate 50 MG tablet Take 50 mg by mouth daily.   [DISCONTINUED] hydrochlorothiazide (HYDRODIURIL) 12.5 MG tablet Take  1 tablet  Daily  for BP & Fluid Retention     Allergies:   Ace inhibitors, Ambien [zolpidem], Crestor [rosuvastatin], Pravastatin, Prozac [fluoxetine hcl], and Zoloft [sertraline hcl]   Social History   Socioeconomic History   Marital status: Married    Spouse name: Not on file   Number of children: 2   Years of education: Not on file   Highest education level: Not on file  Occupational History   Occupation: retired    Fish farm manager: RETIRED    Comment: Retired  Tobacco Use   Smoking status: Former    Types: Cigarettes    Quit date: 10/25/1995    Years since quitting: 25.7   Smokeless tobacco: Never   Tobacco comments:    Maryann Conners [phone]  Substance and Sexual Activity   Alcohol use: Yes    Alcohol/week: 0.0 standard drinks    Comment: occasional   Drug use: No   Sexual activity: Yes    Birth control/protection: Post-menopausal  Other Topics Concern   Not on file  Social History Narrative   Not on file   Social Determinants of Health   Financial Resource Strain: Not on file  Food Insecurity: Not on file  Transportation Needs: Not on file  Physical Activity: Not on file  Stress: Not on file  Social Connections: Not on file     Family History: The patient's family history includes Breast cancer in her paternal aunt; Colon cancer in her maternal grandfather; Heart attack in her brother and father; Lung cancer in her mother; Prostate cancer in her paternal grandfather; Stomach cancer in her father.  ROS:   Please see the history of present illness.     All other systems reviewed and are negative.  EKGs/Labs/Other Studies Reviewed:    The  following studies were reviewed today:   EKG:  EKG is  ordered today.  The ekg ordered today demonstrates normal sinus rhythm, incomplete right bundle branch block, nonspecific T wave flattening, rate 60  Recent Labs: 01/21/2021: Magnesium 2.2 04/08/2021: TSH 0.40 06/17/2021: ALT 33; BUN 10; Creat 0.86; Potassium 4.3; Sodium 135 07/22/2021: Hemoglobin 11.5; Platelets 155  Recent Lipid Panel    Component Value Date/Time   CHOL 186 01/21/2021 0941   TRIG 28 06/05/2021 0953   HDL 76 01/21/2021 0941   CHOLHDL 2.4 01/21/2021 0941   VLDL 13 04/13/2017 1101   LDLCALC 94 01/21/2021 0941    Physical Exam:    VS:  BP (!) 146/79 (Patient Position: Supine)   Pulse (!) 55   Ht '5\' 1"'$  (1.549 m)   Wt 150 lb (68 kg)   SpO2 98%   BMI 28.34 kg/m     Wt Readings from Last 3 Encounters:  07/22/21 148 lb 12.8 oz (67.5 kg)  07/21/21 150 lb (68 kg)  07/03/21 146 lb (66.2 kg)     GEN:  Well nourished, well developed in no acute distress HEENT: Normal NECK: No JVD; No carotid bruits LYMPHATICS: No lymphadenopathy CARDIAC: RRR, no murmurs, rubs, gallops RESPIRATORY:  Clear to auscultation without rales, wheezing or rhonchi  ABDOMEN: Soft, non-tender, non-distended MUSCULOSKELETAL:  No edema; No deformity  SKIN: Warm and dry NEUROLOGIC:  Alert and oriented x 3 PSYCHIATRIC:  Normal affect   ASSESSMENT:    1. Syncope, unspecified syncope type   2. Essential hypertension    PLAN:    Syncope: Previous evaluation in 2012 thought to be due to orthostatic hypotension, HCTZ was discontinued at that time.  Her description of episodes suggests orthostatic hypotension, though orthostatics in clinic today unremarkable.  She has been restarted on HCTZ.  Will discontinue.  Recommend trying compression stockings.  Echocardiogram 06/08/2021 showed normal biventricular function, grade 2 diastolic dysfunction, severe left atrial dilatation, no significant valvular disease.  Does report has noted some low heart  rates when checks at home, will check Zio patch x2 weeks  Hypertension: On HCTZ 12.5 mg daily, olmesartan 20 mg daily.  Discontinue HCTZ given suspected orthostatic hypotension as above.  Asked to check BP daily for next 2 weeks and call with results.  RTC in 3 months    Medication Adjustments/Labs and Tests Ordered: Current medicines are reviewed at length with the patient today.  Concerns regarding medicines are outlined above.  Orders Placed This Encounter  Procedures   LONG TERM MONITOR (3-14 DAYS)   EKG 12-Lead    No orders of the defined types were placed in this encounter.   Patient Instructions  Medication Instructions:  STOP HCTZ  *If you need a refill on your cardiac medications before your next appointment, please call your pharmacy*  Testing/Procedures: [place] Monitor Instructions   Your physician has requested you wear a ZIO patch monitor for _14__ days.  This is a single patch monitor.   [employer] suppl ies one patch monitor per enrollment. Additional stickers are not available. Please do not apply patch if you will be having a Nuclear Stress Test, Echocardiogram, Cardiac CT, MRI, or Chest Xray during the period you would be wearing the monitor. The patch cannot be worn during these tests. You cannot remove and re-apply the ZIO XT patch monitor.  Your ZIO patch monitor will be sent Fed Ex from [employer] directly to your home address. It may take 3-5 days to receive your monitor after you have been enrolled.  Once you have received your monitor, please review the enclosed instructions. Your monitor has already been registered assigning a specific monitor serial # to you.  Billing and Patient Assistance Program Information   We have supplied [employer] with any of your insurance information on file for billing purposes. [employer] offers a sliding scale Patient Assistance Program for patients that do not have insurance, or whose insurance does not  completely cover the cost of the ZIO monitor.   You must apply for the Patient Assistance Program to qualify for this discounted rate.     To apply, please call [employer] at [phone], select option 4, then  select option 2, and ask to apply for Patient Assistance Program.  Theodore Demark will ask your household income, and how many people are in your household.  They will quote your out-of-pocket cost based on that information.  [employer] will also be able to set up a 65-month interest-free payment plan if needed.  Applying the monitor   Shave hair from upper left chest.  Hold abrader disc by orange tab. Rub abrader in 40 strokes over the upper left chest as indicated in your monitor instructions.  Clean area with 4 enclosed alcohol pads. Let dry.  Apply patch as indicated in monitor instructions. Patch will be placed under collarbone on left side of chest with arrow pointing upward.  Rub patch adhesive wings for 2 minutes. Remove white label marked "1". Remove the white label marked "2". Rub patch adhesive wings for 2 additional minutes.  While looking in a mirror, press and release button in center of patch. A small green light will flash 3-4 times. This will be your only indicator that the monitor has been turned on. ?  Do not shower for the first 24 hours. You may shower after the first 24 hours.  Press the button if you feel a symptom. You will hear a small click. Record Date, Time and Symptom in the Patient Logbook.  When you are ready to remove the patch, follow instructions on the last 2 pages of the Patient Logbook. Stick patch monitor onto the last page of Patient Logbook.  Place Patient Logbook in the blue and white box.  Use locking tab on box and tape box closed securely.  The blue and white box has prepaid postage on it. Please place it in the mailbox as soon as possible. Your physician should have your test results approximately 7 days after [facility]  Call  [facility] [phone] you have questions regarding your ZIO XT patch monitor. Call them immediately if you see an orange light blinking on your monitor.  If your monitor falls off in less than 4 days, contact our Monitor department at [phone] ?If your monitor becomes loose or falls off after 4 days call [employer] at [phone] suggestions on securing your monitor.?  Follow-Up: [facility] you and your health needs are our priority.  As part of our continuing mission to provide you with exceptional heart care, we have created designated Provider Care Teams.  These Care Teams include your primary Cardiologist (physician) and Advanced Practice Providers (APPs -  Physician Assistants and Nurse Practitioners) who all work together to provide you with the care you need, when you need it.  We recommend signing up for the patient portal called "MyChart".  Sign up information is provided on this After Visit Summary.  MyChart is used to connect with patients for Virtual Visits (Telemedicine).  Patients are able to view lab/test results, encounter notes, upcoming appointments, etc.  Non-urgent messages can be sent to your provider as well.   To learn more about what you can do with MyChart, go to [email or web address]    Your next appointment:   3 month(s)  The format for your next appointment:   In Person  Provider:   You will see one of the following Advanced Practice Providers on your designated Care Team:   RRosaria Ferries PA-C JCaron Presume PA-C KJory Sims DNP, ANP  Then, Dr. SGardiner Rhymewill plan to see you again in 6 month(s).   Other Instructions Please  check your blood pressure at home two times daily, write it down.  Call the office or send message via Mychart with the readings in 1-2 weeks for Dr. Gardiner Rhyme to review.    How to Use Compression Stockings Compression stockings are elastic socks that squeeze the legs. They help  increase blood flow (circulation) to the legs, decrease swelling in the legs, and reduce the chance of developing blood clots in the lower legs. Compression stockings are often used by people who: Are recovering from surgery. Have poor circulation in their legs. Tend to get blood clots in their legs. Have bulging (varicose) veins. Sit or stay in bed for long periods of time. Follow instructions from your health care provider about how and when to wear your compression stockings. How to wear compression stockings Before you put on your compression stockings: Make sure that they are the correct size and degree of compression. If you do not know your size or required grade of compression, ask your health care provider and follow the manufacturer's instructions that come with the stockings. Make sure that they are clean, dry, and in good condition. Check them for rips and tears. Do not put them on if they are ripped or torn. Put your stockings on first thing in the morning, before you get out of bed. Keep them on for as long as your health care provider advises. When you are wearing your stockings: Keep them as smooth as possible. Do not allow them to bunch up. It is especially important to prevent the stockings from bunching up around your toes or behind your knees. Do not roll the stockings downward and leave them rolled down. This can decrease blood flow to your leg. Change them right away if they become wet or dirty. When you take off your stockings, inspect your legs and feet. Check for: Open sores. Red spots. Swelling. General tips Do not stop wearing compression stockings without talking to your health care provider first. Wash your stockings every day with mild detergent in cold or warm water. Do not use bleach. Air-dry your stockings or dry them in a clothes dryer on low heat. It may be helpful to have two pairs so that you have a pair to wear while the other is being washed. Replace your  stockings every 3-6 months. If skin moisturizing is part of your treatment plan, apply lotion or cream at night so that your skin will be dry when you put on the stockings in the morning. It is harder to put the stockings on when you have lotion on your legs or feet. Wear nonskid shoes or slip-resistant socks when walking while wearing compression stockings. Contact a health care provider and remove your stockings if you have: A feeling of pins and needles in your feet or legs. Open sores, red spots, or other skin changes on your feet or legs. Swelling or pain that gets worse. Get help right away if you have: Numbness or tingling in your lower legs that does not get better right after you take the stockings off. Toes or feet that are unusually cold or turn a bluish color. A warm or red area on your leg. New swelling or soreness in your leg. Shortness of breath. Chest pain. A fast or irregular heartbeat. Light-headedness. Dizziness. Summary Compression stockings are elastic socks that squeeze the legs. They help increase blood flow (circulation) to the legs, decrease swelling in the legs, and reduce the chance of developing blood clots in the lower legs.  Follow instructions from your health care provider about how and when to wear your compression stockings. Do not stop wearing your compression stockings without talking to your health care provider first. This information is not intended to replace advice given to you by your health care provider. Make sure you discuss any questions you have with your health care provider. Document Revised: 02/05/2020 Document Reviewed: 02/27/2020 Elsevier Patient Education  2022 Morrison, Donato Heinz, MD  07/22/2021 10:13 PM    [facility]

## 2021-07-21 ENCOUNTER — Ambulatory Visit (INDEPENDENT_AMBULATORY_CARE_PROVIDER_SITE_OTHER): Payer: Medicare PPO

## 2021-07-21 ENCOUNTER — Encounter: Payer: Self-pay | Admitting: Internal Medicine

## 2021-07-21 ENCOUNTER — Ambulatory Visit: Payer: Medicare PPO | Admitting: Cardiology

## 2021-07-21 ENCOUNTER — Other Ambulatory Visit: Payer: Self-pay

## 2021-07-21 ENCOUNTER — Encounter: Payer: Self-pay | Admitting: Cardiology

## 2021-07-21 VITALS — BP 146/79 | HR 55 | Ht 61.0 in | Wt 150.0 lb

## 2021-07-21 DIAGNOSIS — I10 Essential (primary) hypertension: Secondary | ICD-10-CM

## 2021-07-21 DIAGNOSIS — R55 Syncope and collapse: Secondary | ICD-10-CM

## 2021-07-21 NOTE — Progress Notes (Unsigned)
Patient enrolled for [employer] to mail a 14 day ZIO XT monitor to her address on file.

## 2021-07-21 NOTE — Progress Notes (Signed)
Annual Screening/Preventative Visit & Comprehensive Evaluation &  Examination  Future Appointments  Date Time Provider [place]  07/22/2021 10:00 AM [name], MD [facility] None  10/09/2021  9:45 AM Lendon Colonel, NP [facility]  01/27/2022  2:00 PM Donato Heinz, MD [facility]  07/22/2022 10:00 AM [name], MD [facility] None        This very nice 75 y.o. MWF presents for a Screening /Preventative Visit & comprehensive evaluation and management of multiple medical co-morbidities.  Patient has been followed for HTN, HLD, Rheumatoid Arthritis, Prediabetes , Hypothyroidism. and Vitamin D Deficiency. In 1985, patient was dx'd with Rheumatoid Arthritis and is currently on MTX /Plaquenil followed by Dr Berna Bue. CXR in 2021 showed Aortic Atherosclerosis. Her GERD is controlled on her meds.         HTN predates since 1997. Patient's BP has been controlled at home and patient denies any cardiac symptoms as chest pain, palpitations, shortness of breath or ankle swelling.  Patient was seen yesterday (9/13) by Dr Gayla Doss for hx/o intermittent postural dizziness & her low dose HCTZ 25 mg  was d/c'd. . Today's BP is at goal - 114/72 .       Patient's hyperlipidemia is controlled with diet and medications. Patient denies myalgias or other medication SE's. Last lipids were at goal:  Lab Results  Component Value Date   CHOL 186 01/21/2021   HDL 76 01/21/2021   LDLCALC 94 01/21/2021   TRIG 28 06/05/2021   CHOLHDL 2.4 01/21/2021                                            Patient was diagnosed Hypothyroid circa 1980's and has been on thyroid replacement since.       Patient has hx/o prediabetes (A1c 5.8% /2011) and patient denies reactive hypoglycemic symptoms, visual blurring, diabetic polys or paresthesias. Last A1c was normal & at goal:  Lab Results  Component Value Date   HGBA1C 5.1 01/21/2021         Finally, patient has  history of Vitamin D Deficiency  ("38" /2008) and last Vitamin D was at goal:  Lab Results  Component Value Date   VD25OH 92 01/21/2021     Current Outpatient Medications on File Prior to Visit  Medication Sig   VITAMIN C 1000 MG  Take 1,000 mg by mouth daily.   aspirin 81 MG tablet Take daily.   VITAMIN D  5,000 Units TTakes 3 times a week   folic acid (FOLVITE) 1 MG tablet Take 3 mg  daily.   gabapentin (NEURONTIN) 600 MG tablet TAKE 1/2 TO 1 TABLET 2 TO 3 TIMES DAILY AS NEEDED FOR PAIN   hydroxychloroquine (PLAQUENIL) 200 MG  Take  2 times daily.   levothyroxine  137 MCG tablet TAKE 1 TAB DAILY    MELATONIN  Take 1 tablet at bedtime.   methotrexate 50 MG/2ML injection Inject 25 mg into the skin once a week. Thursdays   olmesartan (BENICAR) 20 MG tablet TAKE 1 TABLET EVERY DAY    omeprazole (PRILOSEC) 40 MG capsule TAKE 1 CAPSULE TWICE DAILY    traZODone (DESYREL) 150 MG tablet Take  1 tablet  1 hour  before Bedtime     zinc gluconate 50 MG tablet Take 50 mg by mouth daily.      Allergies  Allergen Reactions  Ace Inhibitors Cough   Ambien [Zolpidem] Other (See Comments)    Odd Feeling   Crestor [Rosuvastatin]    Pravastatin    Prozac [Fluoxetine Hcl] Other (See Comments)    Decreased libido   Zoloft [Sertraline Hcl]      Past Medical History:  Diagnosis Date   Anemia    Hx of    Arthritis    Calculus of bile duct without mention of cholecystitis or obstruction    Cataract    GERD (gastroesophageal reflux disease)    H. pylori infection    Hx of    Heart murmur    Hyperlipidemia    Hypertension    Hypothyroid    PUD (peptic ulcer disease)    Rheumatoid arthritis(714.0)      Health Maintenance  Topic Date Due   COVID-19 Vaccine (1) Never done   Zoster Vaccines- Shingrix (1 of 2) Never done   COLONOSCOPY (Pts 45-71yr Insurance coverage will need to be confirmed)  03/08/2018   INFLUENZA VACCINE  06/08/2021   MAMMOGRAM  07/30/2022   TETANUS/TDAP   11/08/2022   DEXA SCAN  Completed   Hepatitis C Screening  Completed   PNA vac Low Risk Adult  Completed   HPV VACCINES  Aged Out     Immunization History  Administered Date(s) Administered   Influenza, High Dose Seasonal PF 07/16/2014, 09/17/2015, 07/01/2016, 08/04/2017, 08/25/2018   Pneumococcal Conjugate-13 10/16/2015   Pneumococcal Polysaccharide-23 11/08/2012   Td 11/08/2012   Zoster, Live 09/17/2015     Last Colon - 03/08/2013 - Dr KDeatra Ina- recc 10 yr f/u - due May 2024  Last MGM - 07/30/2020   Past Surgical History:  Procedure Laterality Date   AV FISTULA REPAIR     BLADDER SUSPENSION     BREAST CYST EXCISION Left    BUNIONECTOMY     CARPAL TUNNEL RELEASE     CHOLECYSTECTOMY  09/07/2011   CYSTECTOMY     left breast   ERCP  09/29/2011   Procedure: ENDOSCOPIC RETROGRADE CHOLANGIOPANCREATOGRAPHY (ERCP);  Surgeon: RInda Castle MD;  [facility];  Service: Endoscopy;  Laterality: N/A;   PILONIDAL CYST EXCISION     TENOLYSIS  10/26/2011   Procedure: TENDON SHEATH RELEASE/TENOLYSIS;  Surgeon: GWynonia Sours MD;  [facility]  Service: Orthopedics;  Laterality: Right;  tenosynovectomy removal superficialis slip right index finger   TONSILLECTOMY     TOTAL SHOULDER REPLACEMENT Right 02/22/2014   [place]    ovaries not removed     Family History  Problem Relation Age of Onset   Lung cancer Mother    Stomach cancer Father    Heart attack Father        MI at age 74  Colon cancer Maternal Grandfather    Heart attack Brother        MI at age 74  Prostate cancer Paternal Grandfather    Breast cancer Paternal Aunt      Social History   Tobacco Use   Smoking status: Former    Types: Cigarettes    Quit date: 10/25/1995    Years since quitting: 25.7   Smokeless tobacco: Never   Tobacco comments:    qMaryann [id number] Substance Use Topics   Alcohol use: Yes     Alcohol/week: 0.0 standard drinks    Comment: occasional   Drug use: No  ROS Constitutional: Denies fever, chills, weight loss/gain, headaches, insomnia,  night sweats, and change in appetite. Does c/o fatigue. Eyes: Denies redness, blurred vision, diplopia, discharge, itchy, watery eyes.  ENT: Denies discharge, congestion, post nasal drip, epistaxis, sore throat, earache, hearing loss, dental pain, Tinnitus, Vertigo, Sinus pain, snoring.  Cardio: Denies chest pain, palpitations, irregular heartbeat, syncope, dyspnea, diaphoresis, orthopnea, PND, claudication, edema Respiratory: denies cough, dyspnea, DOE, pleurisy, hoarseness, laryngitis, wheezing.  Gastrointestinal: Denies dysphagia, heartburn, reflux, water brash, pain, cramps, nausea, vomiting, bloating, diarrhea, constipation, hematemesis, melena, hematochezia, jaundice, hemorrhoids Genitourinary: Denies dysuria, frequency, urgency, nocturia, hesitancy, discharge, hematuria, flank pain Breast: Breast lumps, nipple discharge, bleeding.  Musculoskeletal: Denies arthralgia, myalgia, stiffness, Jt. Swelling, pain, limp, and strain/sprain. Denies falls. Skin: Denies puritis, rash, hives, warts, acne, eczema, changing in skin lesion Neuro: No weakness, tremor, incoordination, spasms, paresthesia, pain Psychiatric: Denies confusion, memory loss, sensory loss. Denies Depression. Endocrine: Denies change in weight, skin, hair change, nocturia, and paresthesia, diabetic polys, visual blurring, hyper / hypo glycemic episodes.  Heme/Lymph: No excessive bleeding, bruising, enlarged lymph nodes.  Physical Exam  BP 114/72   Pulse 62   Temp 97.6 F (36.4 C)   Resp 16   Ht '5\' 1"'$  (1.549 m)   Wt 148 lb 12.8 oz (67.5 kg)   SpO2 98%   BMI 28.12 kg/m   Postural     Sitting BP 134/81    P  54            &               Standing  BP 147/75     P  59  General Appearance: Well nourished, well groomed and in no apparent distress.  Eyes: PERRLA,  EOMs, conjunctiva no swelling or erythema, normal fundi and vessels. Sinuses: No frontal/maxillary tenderness ENT/Mouth: EACs patent / TMs  nl. Nares clear without erythema, swelling, mucoid exudates. Oral hygiene is good. No erythema, swelling, or exudate. Tongue normal, non-obstructing. Tonsils not swollen or erythematous. Hearing normal.  Neck: Supple, thyroid not palpable. No bruits, nodes or JVD. Respiratory: Respiratory effort normal.  BS equal and clear bilateral without rales, rhonci, wheezing or stridor. Cardio: Heart sounds are normal with regular rate and rhythm and no murmurs, rubs or gallops. Peripheral pulses are normal and equal bilaterally without edema. No aortic or femoral bruits. Chest: symmetric with normal excursions and percussion. Breasts:  Deferred to [facility]. Abdomen: Flat, soft with bowel sounds active. Nontender, no guarding, rebound, hernias, masses, or organomegaly.  Lymphatics: Non tender without lymphadenopathy.  Musculoskeletal: Full ROM all peripheral extremities, joint stability, 5/5 strength, and normal gait. Skin: Warm and dry without rashes, lesions, cyanosis, clubbing or  ecchymosis.  Neuro: Cranial nerves intact, reflexes equal bilaterally. Normal muscle tone, no cerebellar symptoms. Sensation intact.  Pysch: Alert and oriented X 3, normal affect, Insight and Judgment appropriate.    Assessment and Plan  1. Annual Preventative Screening Examination   2. Essential hypertension  - Urinalysis, Routine w reflex microscopic - Microalbumin / creatinine urine ratio - CBC with Differential/Platelet - COMPLETE METABOLIC PANEL WITH GFR - Magnesium - TSH  3. Hyperlipidemia, mixed  - Lipid panel - TSH  4. Abnormal glucose  - Hemoglobin A1c - Insulin, random  5. Vitamin D deficiency  - VITAMIN D 25 Hydroxy   6. Rheumatoid arthritis involving multiple sites ([place])   7. Hypothyroidism  - TSH  8. Aortic atherosclerosis ([place]) by CXR in 2021  -  Lipid panel  9. Gastroesophageal reflux  disease without esophagitis  - CBC with Differential/Platelet  10. Screening for colorectal cancer  - POC Hemoccult Bld/Stl   11. Screening for ischemic heart disease   12. FHx: heart disease   13. Former smoker   56. Medication management  - Urinalysis, Routine w reflex microscopic - Microalbumin / creatinine urine ratio - CBC with Differential/Platelet - COMPLETE METABOLIC PANEL WITH GFR - Magnesium - Lipid panel - TSH - Hemoglobin A1c - Insulin, random - VITAMIN D 25 Hydroxy         Patient was counseled in prudent diet to achieve/maintain BMI less than 25 for weight control, BP monitoring, regular exercise and medications. Discussed med's effects and SE's. Screening labs and tests as requested with regular follow-up as recommended. Over 40 minutes of exam, counseling, chart review and high complex critical decision making was performed.   Kirtland Bouchard, MD

## 2021-07-21 NOTE — Patient Instructions (Addendum)
Medication Instructions:  STOP HCTZ  *If you need a refill on your cardiac medications before your next appointment, please call your pharmacy*  Testing/Procedures: [place] Monitor Instructions   Your physician has requested you wear a ZIO patch monitor for _14__ days.  This is a single patch monitor.   [employer] suppl ies one patch monitor per enrollment. Additional stickers are not available. Please do not apply patch if you will be having a Nuclear Stress Test, Echocardiogram, Cardiac CT, MRI, or Chest Xray during the period you would be wearing the monitor. The patch cannot be worn during these tests. You cannot remove and re-apply the ZIO XT patch monitor.  Your ZIO patch monitor will be sent Fed Ex from [employer] directly to your home address. It may take 3-5 days to receive your monitor after you have been enrolled.  Once you have received your monitor, please review the enclosed instructions. Your monitor has already been registered assigning a specific monitor serial # to you.  Billing and Patient Assistance Program Information   We have supplied [employer] with any of your insurance information on file for billing purposes. [employer] offers a sliding scale Patient Assistance Program for patients that do not have insurance, or whose insurance does not completely cover the cost of the ZIO monitor.   You must apply for the Patient Assistance Program to qualify for this discounted rate.     To apply, please call IR[employer] at [phone], select option 4, then select option 2, and ask to apply for Patient Assistance Program.  Theodore Demark will ask your household income, and how many people are in your household.  They will quote your out-of-pocket cost based on that information.  [employer] will also be able to set up a 30-month interest-free payment plan if needed.  Applying the monitor   Shave hair from upper left chest.  Hold abrader disc by orange tab. Rub abrader in 40 strokes  over the upper left chest as indicated in your monitor instructions.  Clean area with 4 enclosed alcohol pads. Let dry.  Apply patch as indicated in monitor instructions. Patch will be placed under collarbone on left side of chest with arrow pointing upward.  Rub patch adhesive wings for 2 minutes. Remove white label marked "1". Remove the white label marked "2". Rub patch adhesive wings for 2 additional minutes.  While looking in a mirror, press and release button in center of patch. A small green light will flash 3-4 times. This will be your only indicator that the monitor has been turned on. ?  Do not shower for the first 24 hours. You may shower after the first 24 hours.  Press the button if you feel a symptom. You will hear a small click. Record Date, Time and Symptom in the Patient Logbook.  When you are ready to remove the patch, follow instructions on the last 2 pages of the Patient Logbook. Stick patch monitor onto the last page of Patient Logbook.  Place Patient Logbook in the blue and white box.  Use locking tab on box and tape box closed securely.  The blue and white box has prepaid postage on it. Please place it in the mailbox as soon as possible. Your physician should have your test results approximately 7 days after [facility]  Call [facility] [phone] you have questions regarding your ZIO XT patch monitor. Call them immediately if you see an orange  light blinking on your monitor.  If your monitor falls off in less than 4 days, contact our Monitor department at [phone]. ?If your monitor becomes loose or falls off after 4 days call [employer] at [phone] for suggestions on securing your monitor.?  Follow-Up: At [facility], you and your health needs are our priority.  As part of our continuing mission to provide you with exceptional heart care, we have created designated Provider Care Teams.  These Care Teams  include your primary Cardiologist (physician) and Advanced Practice Providers (APPs -  Physician Assistants and Nurse Practitioners) who all work together to provide you with the care you need, when you need it.  We recommend signing up for the patient portal called "MyChart".  Sign up information is provided on this After Visit Summary.  MyChart is used to connect with patients for Virtual Visits (Telemedicine).  Patients are able to view lab/test results, encounter notes, upcoming appointments, etc.  Non-urgent messages can be sent to your provider as well.   To learn more about what you can do with MyChart, go to [email or web address].    Your next appointment:   3 month(s)  The format for your next appointment:   In Person  Provider:   You will see one of the following Advanced Practice Providers on your designated Care Team:   Rosaria Ferries, PA-C Caron Presume, PA-C Jory Sims, DNP, ANP  Then, Dr. Gardiner Rhyme will plan to see you again in 6 month(s).   Other Instructions Please check your blood pressure at home two times daily, write it down.  Call the office or send message via Mychart with the readings in 1-2 weeks for Dr. Gardiner Rhyme to review.    How to Use Compression Stockings Compression stockings are elastic socks that squeeze the legs. They help increase blood flow (circulation) to the legs, decrease swelling in the legs, and reduce the chance of developing blood clots in the lower legs. Compression stockings are often used by people who: Are recovering from surgery. Have poor circulation in their legs. Tend to get blood clots in their legs. Have bulging (varicose) veins. Sit or stay in bed for long periods of time. Follow instructions from your health care provider about how and when to wear your compression stockings. How to wear compression stockings Before you put on your compression stockings: Make sure that they are the correct size and degree of  compression. If you do not know your size or required grade of compression, ask your health care provider and follow the manufacturer's instructions that come with the stockings. Make sure that they are clean, dry, and in good condition. Check them for rips and tears. Do not put them on if they are ripped or torn. Put your stockings on first thing in the morning, before you get out of bed. Keep them on for as long as your health care provider advises. When you are wearing your stockings: Keep them as smooth as possible. Do not allow them to bunch up. It is especially important to prevent the stockings from bunching up around your toes or behind your knees. Do not roll the stockings downward and leave them rolled down. This can decrease blood flow to your leg. Change them right away if they become wet or dirty. When you take off your stockings, inspect your legs and feet. Check for: Open sores. Red spots. Swelling. General tips Do not stop wearing compression stockings without talking to your health care provider first. Wash your stockings every  day with mild detergent in cold or warm water. Do not use bleach. Air-dry your stockings or dry them in a clothes dryer on low heat. It may be helpful to have two pairs so that you have a pair to wear while the other is being washed. Replace your stockings every 3-6 months. If skin moisturizing is part of your treatment plan, apply lotion or cream at night so that your skin will be dry when you put on the stockings in the morning. It is harder to put the stockings on when you have lotion on your legs or feet. Wear nonskid shoes or slip-resistant socks when walking while wearing compression stockings. Contact a health care provider and remove your stockings if you have: A feeling of pins and needles in your feet or legs. Open sores, red spots, or other skin changes on your feet or legs. Swelling or pain that gets worse. Get help right away if you  have: Numbness or tingling in your lower legs that does not get better right after you take the stockings off. Toes or feet that are unusually cold or turn a bluish color. A warm or red area on your leg. New swelling or soreness in your leg. Shortness of breath. Chest pain. A fast or irregular heartbeat. Light-headedness. Dizziness. Summary Compression stockings are elastic socks that squeeze the legs. They help increase blood flow (circulation) to the legs, decrease swelling in the legs, and reduce the chance of developing blood clots in the lower legs. Follow instructions from your health care provider about how and when to wear your compression stockings. Do not stop wearing your compression stockings without talking to your health care provider first. This information is not intended to replace advice given to you by your health care provider. Make sure you discuss any questions you have with your health care provider. Document Revised: 02/05/2020 Document Reviewed: 02/27/2020 Elsevier Patient Education  2022 [employer].

## 2021-07-21 NOTE — Patient Instructions (Signed)

## 2021-07-22 ENCOUNTER — Ambulatory Visit (INDEPENDENT_AMBULATORY_CARE_PROVIDER_SITE_OTHER): Payer: Medicare PPO | Admitting: Internal Medicine

## 2021-07-22 VITALS — BP 114/72 | HR 62 | Temp 97.6°F | Resp 16 | Ht 61.0 in | Wt 148.8 lb

## 2021-07-22 DIAGNOSIS — Z0000 Encounter for general adult medical examination without abnormal findings: Secondary | ICD-10-CM

## 2021-07-22 DIAGNOSIS — E559 Vitamin D deficiency, unspecified: Secondary | ICD-10-CM | POA: Diagnosis not present

## 2021-07-22 DIAGNOSIS — M069 Rheumatoid arthritis, unspecified: Secondary | ICD-10-CM

## 2021-07-22 DIAGNOSIS — R7309 Other abnormal glucose: Secondary | ICD-10-CM

## 2021-07-22 DIAGNOSIS — E782 Mixed hyperlipidemia: Secondary | ICD-10-CM | POA: Diagnosis not present

## 2021-07-22 DIAGNOSIS — Z87891 Personal history of nicotine dependence: Secondary | ICD-10-CM

## 2021-07-22 DIAGNOSIS — Z1212 Encounter for screening for malignant neoplasm of rectum: Secondary | ICD-10-CM

## 2021-07-22 DIAGNOSIS — I10 Essential (primary) hypertension: Secondary | ICD-10-CM

## 2021-07-22 DIAGNOSIS — Z0001 Encounter for general adult medical examination with abnormal findings: Secondary | ICD-10-CM

## 2021-07-22 DIAGNOSIS — E039 Hypothyroidism, unspecified: Secondary | ICD-10-CM | POA: Diagnosis not present

## 2021-07-22 DIAGNOSIS — I700 Atherosclerosis of aorta: Secondary | ICD-10-CM

## 2021-07-22 DIAGNOSIS — Z79899 Other long term (current) drug therapy: Secondary | ICD-10-CM | POA: Diagnosis not present

## 2021-07-22 DIAGNOSIS — K219 Gastro-esophageal reflux disease without esophagitis: Secondary | ICD-10-CM | POA: Diagnosis not present

## 2021-07-22 DIAGNOSIS — Z1211 Encounter for screening for malignant neoplasm of colon: Secondary | ICD-10-CM

## 2021-07-22 DIAGNOSIS — Z8249 Family history of ischemic heart disease and other diseases of the circulatory system: Secondary | ICD-10-CM

## 2021-07-22 DIAGNOSIS — Z136 Encounter for screening for cardiovascular disorders: Secondary | ICD-10-CM

## 2021-07-23 LAB — MAGNESIUM: Magnesium: 2 mg/dL (ref 1.5–2.5)

## 2021-07-23 LAB — VITAMIN D 25 HYDROXY (VIT D DEFICIENCY, FRACTURES): Vit D, 25-Hydroxy: 88 ng/mL (ref 30–100)

## 2021-07-23 LAB — MICROALBUMIN / CREATININE URINE RATIO
Creatinine, Urine: 49 mg/dL (ref 20–275)
Microalb Creat Ratio: 4 mcg/mg creat (ref <30)
Microalb, Ur: 0.2 mg/dL

## 2021-07-23 LAB — LIPID PANEL
Cholesterol: 201 mg/dL — ABNORMAL HIGH (ref <200)
HDL: 83 mg/dL (ref >=50)
LDL Cholesterol (Calc): 103 mg/dL (calc) — ABNORMAL HIGH
Non-HDL Cholesterol (Calc): 118 mg/dL (calc) (ref <130)
Total CHOL/HDL Ratio: 2.4 (calc) (ref <5.0)
Triglycerides: 64 mg/dL (ref <150)

## 2021-07-23 LAB — CBC WITH DIFFERENTIAL/PLATELET
Absolute Monocytes: 456 cells/uL (ref 200–950)
Basophils Absolute: 53 cells/uL (ref 0–200)
Basophils Relative: 1.1 %
Eosinophils Absolute: 10 cells/uL — ABNORMAL LOW (ref 15–500)
Eosinophils Relative: 0.2 %
HCT: 35 % (ref 35.0–45.0)
Hemoglobin: 11.5 g/dL — ABNORMAL LOW (ref 11.7–15.5)
Lymphs Abs: 845 cells/uL — ABNORMAL LOW (ref 850–3900)
MCH: 32.1 pg (ref 27.0–33.0)
MCHC: 32.9 g/dL (ref 32.0–36.0)
MCV: 97.8 fL (ref 80.0–100.0)
MPV: 12.4 fL (ref 7.5–12.5)
Monocytes Relative: 9.5 %
Neutro Abs: 3437 cells/uL (ref 1500–7800)
Neutrophils Relative %: 71.6 %
Platelets: 155 10*3/uL (ref 140–400)
RBC: 3.58 10*6/uL — ABNORMAL LOW (ref 3.80–5.10)
RDW: 14.7 % (ref 11.0–15.0)
Total Lymphocyte: 17.6 %
WBC: 4.8 10*3/uL (ref 3.8–10.8)

## 2021-07-23 LAB — INSULIN, RANDOM: Insulin: 6.5 u[IU]/mL

## 2021-07-23 LAB — URINALYSIS, ROUTINE W REFLEX MICROSCOPIC
Bacteria, UA: NONE SEEN /HPF
Bilirubin Urine: NEGATIVE
Glucose, UA: NEGATIVE
Hgb urine dipstick: NEGATIVE
Hyaline Cast: NONE SEEN /LPF
Ketones, ur: NEGATIVE
Nitrite: NEGATIVE
Protein, ur: NEGATIVE
RBC / HPF: NONE SEEN /HPF (ref 0–2)
Specific Gravity, Urine: 1.007 (ref 1.001–1.035)
Squamous Epithelial / HPF: NONE SEEN /HPF (ref <=5)
pH: 7 (ref 5.0–8.0)

## 2021-07-23 LAB — HEMOGLOBIN A1C
Hgb A1c MFr Bld: 4.8 % of total Hgb (ref <5.7)
Mean Plasma Glucose: 91 mg/dL
eAG (mmol/L): 5 mmol/L

## 2021-07-23 LAB — COMPLETE METABOLIC PANEL WITH GFR
AG Ratio: 2.4 (calc) (ref 1.0–2.5)
ALT: 16 U/L (ref 6–29)
AST: 30 U/L (ref 10–35)
Albumin: 3.9 g/dL (ref 3.6–5.1)
Alkaline phosphatase (APISO): 71 U/L (ref 37–153)
BUN: 10 mg/dL (ref 7–25)
CO2: 33 mmol/L — ABNORMAL HIGH (ref 20–32)
Calcium: 10.2 mg/dL (ref 8.6–10.4)
Chloride: 102 mmol/L (ref 98–110)
Creat: 0.94 mg/dL (ref 0.60–1.00)
Globulin: 1.6 g/dL (calc) — ABNORMAL LOW (ref 1.9–3.7)
Glucose, Bld: 72 mg/dL (ref 65–99)
Potassium: 4.3 mmol/L (ref 3.5–5.3)
Sodium: 138 mmol/L (ref 135–146)
Total Bilirubin: 0.5 mg/dL (ref 0.2–1.2)
Total Protein: 5.5 g/dL — ABNORMAL LOW (ref 6.1–8.1)
eGFR: 64 mL/min/{1.73_m2} (ref >=60)

## 2021-07-23 LAB — MICROSCOPIC MESSAGE

## 2021-07-23 LAB — TSH: TSH: 27.59 mIU/L — ABNORMAL HIGH (ref 0.40–4.50)

## 2021-07-23 NOTE — Progress Notes (Signed)
============================================================ -   Test results slightly outside the reference range are not unusual. If there is anything important, I will review this with you,  otherwise it is considered normal test values.  If you have further questions,  please do not hesitate to contact me at the office or via My Chart.  ============================================================ ============================================================  -  CBC again shows a mild anemia, so please be sure taking                                          a Multivitamin &                                                   a Super B complex    supplement  ============================================================ ============================================================  -  Total Chol = 201  - is borderline          (  Ideal or Goal is less than 180 )   - and   - Bad LDL Chol = 103           (  Ideal or Goal is less than 70  !  )   - So  Recommend stricter low cholesterol diet   - Cholesterol only comes from animal sources  - ie. meat, dairy, egg yolks  - Eat all the vegetables you want.  - Avoid meat, especially red meat - Beef AND Pork .  - Avoid cheese & dairy - milk & ice cream.     - Cheese is the most concentrated form of trans-fats which  is the worst thing to clog up our arteries.   - Veggie cheese is OK which can be found in the fresh  produce section at [employer] or [employer] or [employer] ============================================================ ============================================================  -  Are you taking " Biotin" which will cause FALSE reading for Thyroid  - Are you taking on an empty stomach with only water for 30 minutes & no Antacid meds, Calcium or Magnesium for 4 hours   - if taking Biotin - stop immediately &                                           will need to recheck Thyroid test in 2 weeks    - If not taking  Biotin, then will need to increase dose of meds   ============================================================ ============================================================  -  Vitamin D = 88 - Excellent ! ============================================================ ============================================================  -  A1c - Normal - No Diabetes - Great  ! ============================================================ ============================================================  -  Vitamin D = 88 - Excellent  !   Please keep dose same  ============================================================ ============================================================  -  All Else - CBC - Kidneys - Electrolytes - Liver - Magnesium & Thyroid    - all  Normal / OK ============================================================ ============================================================

## 2021-07-24 DIAGNOSIS — R55 Syncope and collapse: Secondary | ICD-10-CM | POA: Diagnosis not present

## 2021-07-27 ENCOUNTER — Other Ambulatory Visit: Payer: Self-pay | Admitting: Internal Medicine

## 2021-07-27 DIAGNOSIS — M818 Other osteoporosis without current pathological fracture: Secondary | ICD-10-CM

## 2021-08-11 DIAGNOSIS — R55 Syncope and collapse: Secondary | ICD-10-CM | POA: Diagnosis not present

## 2021-08-12 ENCOUNTER — Other Ambulatory Visit: Payer: Self-pay | Admitting: Internal Medicine

## 2021-08-12 DIAGNOSIS — I10 Essential (primary) hypertension: Secondary | ICD-10-CM

## 2021-08-13 DIAGNOSIS — M545 Low back pain, unspecified: Secondary | ICD-10-CM | POA: Diagnosis not present

## 2021-08-13 DIAGNOSIS — M25562 Pain in left knee: Secondary | ICD-10-CM | POA: Diagnosis not present

## 2021-08-13 DIAGNOSIS — M25561 Pain in right knee: Secondary | ICD-10-CM | POA: Diagnosis not present

## 2021-08-18 ENCOUNTER — Other Ambulatory Visit: Payer: Self-pay | Admitting: Internal Medicine

## 2021-08-18 DIAGNOSIS — Z1231 Encounter for screening mammogram for malignant neoplasm of breast: Secondary | ICD-10-CM

## 2021-08-20 DIAGNOSIS — M6281 Muscle weakness (generalized): Secondary | ICD-10-CM | POA: Diagnosis not present

## 2021-08-20 DIAGNOSIS — R2681 Unsteadiness on feet: Secondary | ICD-10-CM | POA: Diagnosis not present

## 2021-08-28 ENCOUNTER — Telehealth: Payer: Self-pay | Admitting: Cardiology

## 2021-08-28 DIAGNOSIS — R55 Syncope and collapse: Secondary | ICD-10-CM

## 2021-08-28 DIAGNOSIS — R001 Bradycardia, unspecified: Secondary | ICD-10-CM

## 2021-08-28 NOTE — Telephone Encounter (Signed)
Message routed to Dr. Newman Nickels RN.

## 2021-08-28 NOTE — Telephone Encounter (Signed)
Pt is returning call for results 

## 2021-09-01 DIAGNOSIS — M545 Low back pain, unspecified: Secondary | ICD-10-CM | POA: Diagnosis not present

## 2021-09-01 NOTE — Telephone Encounter (Signed)
Left message to call back  

## 2021-09-02 NOTE — Telephone Encounter (Signed)
Pt is returning call.  

## 2021-09-02 NOTE — Telephone Encounter (Signed)
Returned call to pt regarding monitor report. Pt state [place] the [place] between [place] and experienced a syncopal episode. She report she was wearing the monitor but can't recall the exact date. She believe episode happened either 9/22 or 9/23 but hasn't had one since.    Pt also wanted to make MD aware of updated BP reading.   9/20-9/23 BP readings  133/69 154/78 126/77 140/68 154/71 132/67 148/67    Donato Heinz, MD  08/27/2021 10:31 AM EDT     Did report an episode of dizziness with heart rate in 40s.  Is she continuing to have syncopal episodes?  Did she have any syncope while wearing the monitor?  Or did syncope resolved with stopping HCTZ?

## 2021-09-03 NOTE — Telephone Encounter (Signed)
Patient aware of recommendations and verbalized understanding.   Referral placed

## 2021-09-03 NOTE — Telephone Encounter (Signed)
Given she has continued to have syncopal episodes with discontinuing HCTZ and considering monitor showed what appeared to be episodes of symptomatic bradycardia with rate in 40s, recommend referral to EP

## 2021-09-08 DIAGNOSIS — M79661 Pain in right lower leg: Secondary | ICD-10-CM | POA: Diagnosis not present

## 2021-09-08 DIAGNOSIS — M79662 Pain in left lower leg: Secondary | ICD-10-CM | POA: Diagnosis not present

## 2021-09-08 DIAGNOSIS — M545 Low back pain, unspecified: Secondary | ICD-10-CM | POA: Diagnosis not present

## 2021-09-11 ENCOUNTER — Other Ambulatory Visit: Payer: Self-pay

## 2021-09-11 ENCOUNTER — Ambulatory Visit: Payer: Medicare PPO | Admitting: Cardiology

## 2021-09-11 ENCOUNTER — Encounter: Payer: Self-pay | Admitting: Cardiology

## 2021-09-11 VITALS — BP 150/90 | HR 54 | Ht 61.0 in | Wt 148.8 lb

## 2021-09-11 DIAGNOSIS — R001 Bradycardia, unspecified: Secondary | ICD-10-CM | POA: Diagnosis not present

## 2021-09-11 DIAGNOSIS — I10 Essential (primary) hypertension: Secondary | ICD-10-CM | POA: Diagnosis not present

## 2021-09-11 DIAGNOSIS — R55 Syncope and collapse: Secondary | ICD-10-CM

## 2021-09-11 NOTE — Patient Instructions (Addendum)
Medication Instructions:  Your physician recommends that you continue on your current medications as directed. Please refer to the Current Medication list given to you today. *If you need a refill on your cardiac medications before your next appointment, please call your pharmacy*  Lab Work: None ordered. If you have labs (blood work) drawn today and your tests are completely normal, you will receive your results only by: MyChart Message (if you have MyChart) OR A paper copy in the mail If you have any lab test that is abnormal or we need to change your treatment, we will call you to review the results.  Testing/Procedures: None ordered.  Follow-Up: At [facility], you and your health needs are our priority.  As part of our continuing mission to provide you with exceptional heart care, we have created designated Provider Care Teams.  These Care Teams include your primary Cardiologist (physician) and Advanced Practice Providers (APPs -  Physician Assistants and Nurse Practitioners) who all work together to provide you with the care you need, when you need it.  Your next appointment:   Your physician wants you to follow-up in: as needed   

## 2021-09-11 NOTE — Progress Notes (Signed)
Electrophysiology Office Note:    Date:  09/11/2021   ID:  [name] 10/01/1946, MRN [id number]  PCP:  [name], MD  [facility] Cardiologist:  None  [facility] Electrophysiologist:  Vickie Epley, MD   Referring MD: Donato Heinz*   Chief Complaint: Syncope  History of Present Illness:    [name] is a 75 y.o. female who presents for an evaluation of syncope at the request of Dr. Gardiner Rhyme. Their medical history includes GERD, hypertension, hyperlipidemia and rheumatoid arthritis.  She last saw Dr. Gardiner Rhyme on July 21, 2021.  At that appointment she reported syncopal episodes occurring at least 1 time per week.  They were occurring with changes in position.  There was concern about bradycardia around the time of the episodes.  Today she is with her husband in clinic.  She tells me that her syncopal episodes consistently occur when she is moves from a sitting to standing position.  They have never occurred while seated.  They have never occurred abruptly.  That she always has a warning sign before syncopal episodes occur.  She tells me that she will feel lightheaded and dizzy and her vision will go dark.  If she stops moving, the symptoms frequently will pass without a syncopal episode.  If she tries to keep going despite the warning signs, she will have a syncopal episode.  She does not feel palpitations at the time.  She also endorses dizziness and feeling out of it when she lifts her head up.  She gives the example of looking up and try to put something into a cabinet.  She has never had her carotid/vertebrals looked at.    Past Medical History:  Diagnosis Date   Anemia    Hx of    Arthritis    Calculus of bile duct without mention of cholecystitis or obstruction    Cataract    GERD (gastroesophageal reflux disease)    H. pylori infection    Hx of    Heart murmur    Hyperlipidemia    Hypertension    Hypothyroid    PUD  (peptic ulcer disease)    Rheumatoid arthritis(714.0)     Past Surgical History:  Procedure Laterality Date   AV FISTULA REPAIR     BLADDER SUSPENSION     BREAST CYST EXCISION Left    BUNIONECTOMY     CARPAL TUNNEL RELEASE     CHOLECYSTECTOMY  09/07/2011   CYSTECTOMY     left breast   ERCP  09/29/2011   Procedure: ENDOSCOPIC RETROGRADE CHOLANGIOPANCREATOGRAPHY (ERCP);  Surgeon: Inda Castle, MD;  Location: [facility];  Service: Endoscopy;  Laterality: N/A;   PILONIDAL CYST EXCISION     TENOLYSIS  10/26/2011   Procedure: TENDON SHEATH RELEASE/TENOLYSIS;  Surgeon: Wynonia Sours, MD;  Location: [facility];  Service: Orthopedics;  Laterality: Right;  tenosynovectomy removal superficialis slip right index finger   TONSILLECTOMY     TOTAL SHOULDER REPLACEMENT Right 02/22/2014   Dr. Enrigue Catena at [facility]     ovaries not removed    Current Medications: Current Meds  Medication Sig   alendronate (FOSAMAX) 70 MG tablet TAKE 1 TABLET ONE TIME WEEKLY AS DIRECTED (SEE PACKAGE FOR ADDITIONAL INSTRUCTIONS)   Ascorbic Acid (VITAMIN C) 1000 MG tablet Take 1,000 mg by mouth daily. 3 a week   aspirin 81 MG tablet Take 81 mg by mouth daily.  3 a week   Cholecalciferol (VITAMIN D PO) Take 5,000 Units by mouth See admin instructions. Takes 3 times a week   folic acid (FOLVITE) 1 MG tablet Take 3 mg by mouth daily.   gabapentin (NEURONTIN) 600 MG tablet TAKE 1/2 TO 1 TABLET 2 TO 3 TIMES DAILY AS NEEDED FOR PAIN   hydroxychloroquine (PLAQUENIL) 200 MG tablet Take 200 mg by mouth 2 (two) times daily.   levothyroxine (SYNTHROID) 137 MCG tablet TAKE 1 TAB DAILY ON AN EMPTY STOMACH WITH ONLY WATER X30 MIN, NO ANTACID MEDS, CALCIUM OR MAGNESIUM X4 HOURS. AVOID BIOTIN   MELATONIN PO Take 1 tablet by mouth at bedtime.   methotrexate 50 MG/2ML injection Inject 25 mg into the skin once a week. Thursdays   olmesartan (BENICAR) 20 MG tablet TAKE  1 TABLET EVERY DAY FOR BLOOD PRESSURE   omeprazole (PRILOSEC) 40 MG capsule TAKE 1 CAPSULE TWICE DAILY FOR ACID REFLUX (Patient taking differently: Take 40 mg by mouth as needed.)   traZODone (DESYREL) 150 MG tablet Take  1 tablet  1 hour  before Bedtime  as needed for Sleep   zinc gluconate 50 MG tablet Take 50 mg by mouth daily. 3 times a week     Allergies:   Ace inhibitors, Ambien [zolpidem], Crestor [rosuvastatin], Pravastatin, Prozac [fluoxetine hcl], and Zoloft [sertraline hcl]   Social History   Socioeconomic History   Marital status: Married    Spouse name: Not on file   Number of children: 2   Years of education: Not on file   Highest education level: Not on file  Occupational History   Occupation: retired    Fish farm manager: RETIRED    Comment: Retired  Tobacco Use   Smoking status: Former    Types: Cigarettes    Quit date: 10/25/1995    Years since quitting: 25.8   Smokeless tobacco: Never   Tobacco comments:    Maryann Conners [phone]  Substance and Sexual Activity   Alcohol use: Yes    Alcohol/week: 0.0 standard drinks    Comment: occasional   Drug use: No   Sexual activity: Yes    Birth control/protection: Post-menopausal  Other Topics Concern   Not on file  Social History Narrative   Not on file   Social Determinants of Health   Financial Resource Strain: Not on file  Food Insecurity: Not on file  Transportation Needs: Not on file  Physical Activity: Not on file  Stress: Not on file  Social Connections: Not on file     Family History: The patient's family history includes Breast cancer in her paternal aunt; Colon cancer in her maternal grandfather; Heart attack in her brother and father; Lung cancer in her mother; Prostate cancer in her paternal grandfather; Stomach cancer in her father.  ROS:   Please see the history of present illness.    All other systems reviewed and are negative.  EKGs/Labs/Other Studies Reviewed:    The following studies were reviewed  today:  August 12, 2021 ZIO monitor personally reviewed Heart rate 40-162, average 58 10 SVT, longest lasting 5 beats.  Review of rhythm strips suggest atrial tachycardia Idioventricular rhythm present Rare supraventricular ectopy Rare ventricular ectopy Patient triggered events correspond to sinus rhythm/sinus bradycardia  June 08, 2021 echo personally reviewed Left ventricular function normal, 60% Mild LVH Normal RV function Severely dilated left atrium No MR  EKG:  The ekg ordered today demonstrates sinus bradycardia with a ventricular rate of 54 bpm.   Recent Labs: 07/22/2021:  ALT 16; BUN 10; Creat 0.94; Hemoglobin 11.5; Magnesium 2.0; Platelets 155; Potassium 4.3; Sodium 138; TSH 27.59  Recent Lipid Panel    Component Value Date/Time   CHOL 201 (H) 07/22/2021 1017   TRIG 64 07/22/2021 1017   HDL 83 07/22/2021 1017   CHOLHDL 2.4 07/22/2021 1017   VLDL 13 04/13/2017 1101   LDLCALC 103 (H) 07/22/2021 1017    Physical Exam:    VS:  BP (!) 150/90   Pulse (!) 54   Ht 5\' 1"  (1.549 m)   Wt 148 lb 12.8 oz (67.5 kg)   SpO2 99%   BMI 28.12 kg/m     Wt Readings from Last 3 Encounters:  09/11/21 148 lb 12.8 oz (67.5 kg)  07/22/21 148 lb 12.8 oz (67.5 kg)  07/21/21 150 lb (68 kg)     GEN:  Well nourished, well developed in no acute distress HEENT: Normal NECK: No JVD; No carotid bruits LYMPHATICS: No lymphadenopathy CARDIAC: Bradycardic, regular rhythm.  Soft systolic murmur at the upper sternal borders, no rubs or gallops.   RESPIRATORY:  Clear to auscultation without rales, wheezing or rhonchi  ABDOMEN: Soft, non-tender, non-distended MUSCULOSKELETAL:  No edema; No deformity  SKIN: Warm and dry NEUROLOGIC:  Alert and oriented x 3 PSYCHIATRIC:  Normal affect       ASSESSMENT:    1. Bradycardia   2. Syncope, unspecified syncope type    PLAN:    In order of problems listed above:  #Syncope Appears to be related to orthostatic intolerance.  I do not  suspect an arrhythmic cause of her syncope.  I do not think this is related to her bradycardia.  I do not think a pacemaker is indicated at this time.  She does endorse some symptoms when she lifts her head up to look into a cabinet.  I wonder whether duplex studies of the carotid/vertebral systems are indicated.  I will send a message over to Dr. Gardiner Rhyme to get his input.  #Hypertension Systolic 355 today.  I think this is an acceptable goal for her in an effort to avoid hypotension which could precipitate more presyncope/syncope.  Continue to follow-up with primary cardiologist.   Follow-up with EP on an as-needed basis.   Medication Adjustments/Labs and Tests Ordered: Current medicines are reviewed at length with the patient today.  Concerns regarding medicines are outlined above.  Orders Placed This Encounter  Procedures   EKG 12-Lead    No orders of the defined types were placed in this encounter.    Signed, Hilton Cork. Quentin Ore, MD, [facility], [facility] 09/11/2021 12:19 PM    Electrophysiology [facility]

## 2021-09-14 ENCOUNTER — Other Ambulatory Visit: Payer: Self-pay

## 2021-09-14 DIAGNOSIS — Z1211 Encounter for screening for malignant neoplasm of colon: Secondary | ICD-10-CM

## 2021-09-14 LAB — POC HEMOCCULT BLD/STL (HOME/3-CARD/SCREEN)
Card #2 Fecal Occult Blod, POC: NEGATIVE
Card #3 Fecal Occult Blood, POC: NEGATIVE
Fecal Occult Blood, POC: NEGATIVE

## 2021-09-15 ENCOUNTER — Ambulatory Visit
Admission: RE | Admit: 2021-09-15 | Discharge: 2021-09-15 | Disposition: A | Discharge status: Home / Self Care | Payer: Medicare PPO | Source: Ambulatory Visit | Attending: Internal Medicine | Admitting: Internal Medicine

## 2021-09-15 ENCOUNTER — Other Ambulatory Visit: Payer: Self-pay

## 2021-09-15 DIAGNOSIS — Z1231 Encounter for screening mammogram for malignant neoplasm of breast: Secondary | ICD-10-CM | POA: Diagnosis not present

## 2021-09-15 DIAGNOSIS — Z1212 Encounter for screening for malignant neoplasm of rectum: Secondary | ICD-10-CM | POA: Diagnosis not present

## 2021-09-15 DIAGNOSIS — Z1211 Encounter for screening for malignant neoplasm of colon: Secondary | ICD-10-CM | POA: Diagnosis not present

## 2021-09-16 ENCOUNTER — Ambulatory Visit: Payer: Medicare PPO | Admitting: Internal Medicine

## 2021-09-16 ENCOUNTER — Encounter: Payer: Self-pay | Admitting: Internal Medicine

## 2021-09-16 VITALS — BP 170/80 | HR 79 | Temp 97.9°F | Resp 16 | Ht 61.0 in | Wt 151.8 lb

## 2021-09-16 DIAGNOSIS — I10 Essential (primary) hypertension: Secondary | ICD-10-CM | POA: Diagnosis not present

## 2021-09-16 DIAGNOSIS — E039 Hypothyroidism, unspecified: Secondary | ICD-10-CM

## 2021-09-16 LAB — TSH: TSH: 18.77 mIU/L — ABNORMAL HIGH (ref 0.40–4.50)

## 2021-09-16 MED ORDER — BISOPROLOL FUMARATE 5 MG PO TABS: ORAL_TABLET | ORAL | 3 refills | Status: DC

## 2021-09-16 NOTE — Progress Notes (Signed)
Future Appointments  Date Time Provider [place]  09/16/2021 10:30 AM [name], MD [facility] None  10/09/2021 10:05 AM Lendon Colonel, NP [facility]  01/19/2022 11:00 AM Magda Bernheim, NP [facility] None  01/27/2022  2:00 PM Donato Heinz, MD [facility]  07/22/2022 10:00 AM [name], MD [facility] None    History of Present Illness:           This very nice 75 y.o. MWF with  HTN, HLD, Rheumatoid Arthritis, Prediabetes , Hypothyroidism and Vitamin D Deficiency who  presents with c/o fatigue.  BP is also noted elevated today.     Patient was diagnosed Hypothyroid circa 1980's and has been on thyroid replacement since. TSH was elevated at 27.59 on 07/22/2021 and patient never reviewed / nor responded to her MyChart message.    Medications    alendronate (FOSAMAX) 70 MG tablet, TAKE 1 TABLET ONE TIME WEEKLY     levothyroxine (SYNTHROID) 137 MCG tablet, TAKE 1 TAB DAILY     olmesartan (BENICAR) 20 MG tablet, TAKE 1 TABLET EVERY DAY FOR BLOOD PRESSURE   aspirin 81 MG tablet, Take 81 mg by mouth daily. 3 a week   folic acid (FOLVITE) 1 MG tablet, Take 3 mg by mouth daily.   Ascorbic Acid (VITAMIN C) 1000 MG tablet, Take 1,000 mg by mouth daily. 3 a week   Cholecalciferol (VITAMIN D PO), Take 5,000 Units by mouth See admin instructions. Takes 3 times a week   gabapentin (NEURONTIN) 600 MG tablet, TAKE 1/2 TO 1 TABLET 2 TO 3 TIMES DAILY AS NEEDED FOR PAIN   hydroxychloroquine (PLAQUENIL) 200 MG tablet, Take 200 mg by mouth 2 (two) times daily.   MELATONIN PO, Take 1 tablet by mouth at bedtime.   methotrexate 50 MG/2ML injection, Inject 25 mg into the skin once a week. Thursdays   omeprazole (PRILOSEC) 40 MG capsule, TAKE 1 CAPSULE TWICE DAILY FOR ACID REFLUX (Patient taking differently: Take 40 mg by mouth as needed.)   traZODone (DESYREL) 150 MG tablet, Take  1 tablet  1 hour  before Bedtime  as needed for Sleep   zinc gluconate 50  MG tablet, Take 50 mg by mouth daily. 3 times a week  Problem list She has Essential hypertension; Rheumatoid arthritis ([place]); Hyperlipidemia, mixed; Gastroesophageal reflux disease without esophagitis; Personal history of colonic polyps; Vitamin D deficiency; Abnormal glucose; Medication management; Hypothyroidism; Osteoporosis; S/P shoulder replacement, right; Hyperparathyroidism ([place]); Overweight (BMI 25.0-29.9); Insomnia; Neuropathy; Aortic atherosclerosis ([place]) by CXR in 2021; Depression, major, single episode, in partial remission ([place]); COVID-19; Hyponatremia; COVID-19 virus infection; and Staphylococcus epidermidis bacteremia on their problem list.   Observations/Objective:  BP (!) 170/80   Pulse 79   Temp 97.9 F (36.6 C)   Resp 16   Ht 5\' 1"  (1.549 m)   Wt 151 lb 12.8 oz (68.9 kg)   SpO2 97%   BMI 28.68 kg/m   HEENT - WNL. Neck - supple.  Chest - Clear equal BS. Cor - Nl HS. RRR w/o sig MGR. PP 1(+). No edema. MS- FROM w/o deformities.  Gait Nl. Neuro -  Nl w/o focal abnormalities.  Assessment and Plan:  1. Essential hypertension  - TSH - bisoprolol 5 MG tablet;  Take  1 tablet  every Morning  for BP   Dispense: 90 tablet; Refill: 3  2. Hypothyroidism  - TSH   Follow Up Instructions:        I discussed the assessment and  treatment plan with the patient. The patient was provided an opportunity to ask questions and all were answered. The patient agreed with the plan and demonstrated an understanding of the instructions.       The patient was advised to call back or seek an in-person evaluation if the symptoms worsen or if the condition fails to improve as anticipated.    Kirtland Bouchard, MD

## 2021-09-16 NOTE — Patient Instructions (Signed)
Hypothyroidism Hypothyroidism is when the thyroid gland does not make enough of certain hormones (it is underactive). The thyroid gland is a small gland located in the lower front part of the neck, just in front of the windpipe (trachea). This gland makes hormones that help control how the body uses food for energy (metabolism) as well as how the heart and brain function. These hormones also play a role in keeping your bones strong. When the thyroid is underactive, it produces too little of the hormones thyroxine (T4) and triiodothyronine (T3). What are the causes? This condition may be caused by: Hashimoto's disease. This is a disease in which the body's disease-fighting system (immune system) attacks the thyroid gland. This is the most common cause. Viral infections. Pregnancy. Certain medicines. Birth defects. Past radiation treatments to the head or neck for cancer. Past treatment with radioactive iodine. Past exposure to radiation in the environment. Past surgical removal of part or all of the thyroid. Problems with a gland in the center of the brain (pituitary gland). Lack of enough iodine in the diet. What increases the risk? You are more likely to develop this condition if: You are female. You have a family history of thyroid conditions. You use a medicine called lithium. You take medicines that affect the immune system (immunosuppressants). What are the signs or symptoms? Symptoms of this condition include: Feeling as though you have no energy (lethargy). Not being able to tolerate cold. Weight gain that is not explained by a change in diet or exercise habits. Lack of appetite. Dry skin. Coarse hair. Menstrual irregularity. Slowing of thought processes. Constipation. Sadness or depression. How is this diagnosed? This condition may be diagnosed based on: Your symptoms, your medical history, and a physical exam. Blood tests. You may also have imaging tests, such as an  ultrasound or MRI. How is this treated? This condition is treated with medicine that replaces the thyroid hormones that your body does not make. After you begin treatment, it may take several weeks for symptoms to go away. Follow these instructions at home: Take over-the-counter and prescription medicines only as told by your health care provider. If you start taking any new medicines, tell your health care provider. Keep all follow-up visits as told by your health care provider. This is important. As your condition improves, your dosage of thyroid hormone medicine may change. You will need to have blood tests regularly so that your health care provider can monitor your condition. Contact a health care provider if: Your symptoms do not get better with treatment. You are taking thyroid hormone replacement medicine and you: Sweat a lot. Have tremors. Feel anxious. Lose weight rapidly. Cannot tolerate heat. Have emotional swings. Have diarrhea. Feel weak. Summary Hypothyroidism is when the thyroid gland does not make enough of certain hormones (it is underactive). When the thyroid is underactive, it produces too little of the hormones thyroxine (T4) and triiodothyronine (T3). The most common cause is Hashimoto's disease, a disease in which the body's disease-fighting system (immune system) attacks the thyroid gland. The condition can also be caused by viral infections, medicine, pregnancy, or past radiation treatment to the head or neck. Symptoms may include weight gain, dry skin, constipation, feeling as though you do not have energy, and not being able to tolerate cold. This condition is treated with medicine to replace the thyroid hormones that your body does not make. This information is not intended to replace advice given to you by your health care provider. Make sure you  discuss any questions you have with your health care provider. Document Revised: 07/02/2021 Document Reviewed:  07/10/2020 Elsevier Patient Education  2022 [employer].

## 2021-09-17 ENCOUNTER — Other Ambulatory Visit: Payer: Self-pay | Admitting: Internal Medicine

## 2021-09-17 DIAGNOSIS — E039 Hypothyroidism, unspecified: Secondary | ICD-10-CM

## 2021-09-17 MED ORDER — LEVOTHYROXINE SODIUM 150 MCG PO TABS: ORAL_TABLET | ORAL | 3 refills | Status: DC

## 2021-09-17 NOTE — Progress Notes (Signed)
============================================================ -   Test results slightly outside the reference range are not unusual. If there is anything important, I will review this with you,  otherwise it is considered normal test values.  If you have further questions,  please do not hesitate to contact me at the office or via My Chart.  ============================================================ ============================================================  -  TSH is elevated  which means thyroid hormone is low in Blood !  - So I sent in a new Rx increasing Levothyroxine from 137 up to      - - - > > > Levothyroxine 150 mcg tablet every day    Please Take on an empty stomach with only water for 30 minutes &                              no Antacid meds, Calcium or Magnesium for 4 hours &                                                                                                        avoid Biotin    Suggest call office to schedule a Nurse Visit in                                                      about a month to recheck Thyroid lab (TSH)  ============================================================ ============================================================

## 2021-09-21 NOTE — Telephone Encounter (Signed)
Please call patient

## 2021-09-24 ENCOUNTER — Telehealth: Payer: Self-pay | Admitting: Cardiology

## 2021-09-24 ENCOUNTER — Telehealth: Payer: Self-pay | Admitting: *Deleted

## 2021-09-24 NOTE — Telephone Encounter (Signed)
Spoke to patient and husband.   States blood pressure  yesterday  afternoon  190/90  pulse rate 49 .   Chest pain  yesterday  last evening. None at present time.   Patient rated  pain yesterday as 5 to 6 /10   no nausea ,  left side and stomach area.   Just felt tired.      Blood pressure  today 172/92  heart rate 51 . No chest pain  at present.  Patient recent visit in Sept  -  echo,wore a monitor ( for loss of consciousness)  No heart artery disease evaluated at that time.   Patient has upcoming appointment 10/09/21.  RN reviewing schedule and  informed patient  will contact her back . Patient verbalized understanding.

## 2021-09-24 NOTE — Telephone Encounter (Signed)
Open error 

## 2021-09-24 NOTE — Telephone Encounter (Addendum)
RN spoke to husband. Informed of a an appointment for 09/28/21 at  10:45 am   Aware if symptoms return  to go have evaluated at Emergency room.  Aware to to bring medication and be to the office at 10:15 am.   Keep monitor of blood pressure and bring readings  Aware  the 10/09/21 appointment has not been changed  until the appointment on 09/28/21  Husband verbalized understanding.

## 2021-09-24 NOTE — Telephone Encounter (Signed)
Pt c/o of Chest Pain: STAT if CP now or developed within 24 hours  1. Are you having CP right now? no  2. Are you experiencing any other symptoms (ex. SOB, nausea, vomiting, sweating)? Fatigue yesterday, had SOB yesterday   3. How long have you been experiencing CP? Since yesterday afternoon  4. Is your CP continuous or coming and going? Came and went  5. Have you taken Nitroglycerin? no ?   Patient's husband states the patient started having chest pain yesterday afternoon. He says it was on the left side of her chest and she was also fatigued and had SOB. He says her BP has also been getting high. He says last night it was 190/90 and this morning it is 172/92. He says she was put on new medications and is not sure if that is causing these problems. He says she was given bisoprolol it has not been helping.

## 2021-09-27 NOTE — Progress Notes (Signed)
Cardiology Office Note:    Date:  09/28/2021   ID:  Tammie, Vazquez [birth year], MRN [id number]  PCP:  [name], [place] Cardiologist: Donato Heinz, MD   Reason for visit: Chest pain and hypertension  History of Present Illness:    [name] is a 75 y.o. female with a hx of hypertension, hyperlipidemia, hypothyroidism, peptic ulcer disease, rheumatoid arthritis. She has a history of syncope with position changes that was thought to be due to orthostatic intolerance.  HCTZ was discontinued.  Compression stockings recommended.  She saw EP who did not think syncope was related to her bradycardia.  EP recommended permissive hypertension to avoid hypotension that would precipitate more presyncope/syncope.  Echocardiogram 06/08/2021 showed normal biventricular function, grade 2 diastolic dysfunction, severe left atrial dilatation, no significant valvular disease.  Lexiscan stress test 2016 with no ischemia.  She saw her PCP September 16, 2021.  For hypertension, Dr. Melford Aase started Bisoprolol 5 mg daily.  She called the office on September 24, 2021 number blood pressure 190/90.  Pulse 49.  She mentioned chest pain, nausea and fatigue.  Today, they come in with blood pressure readings.  Systolic blood pressure 470J to 170s.  Diastolics 62E to 366Q.  Heart rate 50s.  They never started the Bisoprolol 5 mg daily prescribed by Dr. Melford Aase.  Concerned about hx of syncope and hx of bradycardia.  She mentions squeezing chest pain that can occur with exertion or rest.  Nothing particular makes it worse or better.  She has shortness of breath with moderate activity that is stable.  She denies PND, orthopnea lower extremity edema.  She sometimes feels a strong heartbeat.  They ask for carotid/vertebral duplex that Dr. Lars Mage mentioned to them for hx of dizziness/syncope.     Past Medical History:  Diagnosis Date   Anemia    Hx of    Arthritis     Calculus of bile duct without mention of cholecystitis or obstruction    Cataract    GERD (gastroesophageal reflux disease)    H. pylori infection    Hx of    Heart murmur    Hyperlipidemia    Hypertension    Hypothyroid    PUD (peptic ulcer disease)    Rheumatoid arthritis(714.0)     Past Surgical History:  Procedure Laterality Date   AV FISTULA REPAIR     BLADDER SUSPENSION     BREAST CYST EXCISION Left    BUNIONECTOMY     CARPAL TUNNEL RELEASE     CHOLECYSTECTOMY  09/07/2011   CYSTECTOMY     left breast   ERCP  09/29/2011   Procedure: ENDOSCOPIC RETROGRADE CHOLANGIOPANCREATOGRAPHY (ERCP);  Surgeon: Inda Castle, MD;  Location: [facility];  Service: Endoscopy;  Laterality: N/A;   PILONIDAL CYST EXCISION     TENOLYSIS  10/26/2011   Procedure: TENDON SHEATH RELEASE/TENOLYSIS;  Surgeon: Wynonia Sours, MD;  Location: [place];  Service: Orthopedics;  Laterality: Right;  tenosynovectomy removal superficialis slip right index finger   TONSILLECTOMY     TOTAL SHOULDER REPLACEMENT Right 02/22/2014   Dr. Enrigue Catena at [place]     ovaries not removed    Current Medications: Current Meds  Medication Sig   alendronate (FOSAMAX) 70 MG tablet TAKE 1 TABLET ONE TIME WEEKLY AS DIRECTED (SEE PACKAGE FOR ADDITIONAL INSTRUCTIONS)   amLODipine (NORVASC) 5 MG tablet Take 1 tablet (5 mg  total) by mouth daily.   Ascorbic Acid (VITAMIN C) 1000 MG tablet Take 1,000 mg by mouth daily. 3 a week   aspirin 81 MG tablet Take 81 mg by mouth daily. 3 a week   Cholecalciferol (VITAMIN D PO) Take 5,000 Units by mouth See admin instructions. Takes 3 times a week   folic acid (FOLVITE) 1 MG tablet Take 3 mg by mouth daily.   gabapentin (NEURONTIN) 600 MG tablet TAKE 1/2 TO 1 TABLET 2 TO 3 TIMES DAILY AS NEEDED FOR PAIN   hydroxychloroquine (PLAQUENIL) 200 MG tablet Take 200 mg by mouth 2 (two) times daily.   levothyroxine (SYNTHROID)  150 MCG tablet Take  1 tablet  Daily  on an empty stomach with only water for 30 minutes &  No Antacid meds, Calcium or Magnesium for 4 hours & avoid Biotin   MELATONIN PO Take 1 tablet by mouth at bedtime.   methotrexate 50 MG/2ML injection Inject 25 mg into the skin once a week. Thursdays   olmesartan (BENICAR) 20 MG tablet TAKE 1 TABLET EVERY DAY FOR BLOOD PRESSURE   omeprazole (PRILOSEC) 40 MG capsule TAKE 1 CAPSULE TWICE DAILY FOR ACID REFLUX (Patient taking differently: Take 40 mg by mouth as needed.)   traZODone (DESYREL) 150 MG tablet Take  1 tablet  1 hour  before Bedtime  as needed for Sleep   zinc gluconate 50 MG tablet Take 50 mg by mouth daily. 3 times a week   [DISCONTINUED] bisoprolol (ZEBETA) 5 MG tablet Take  1 tablet  every Morning  for BP     Allergies:   Ace inhibitors, Ambien [zolpidem], Crestor [rosuvastatin], Pravastatin, Prozac [fluoxetine hcl], and Zoloft [sertraline hcl]   Social History   Socioeconomic History   Marital status: Married    Spouse name: Not on file   Number of children: 2   Years of education: Not on file   Highest education level: Not on file  Occupational History   Occupation: retired    Fish farm manager: RETIRED    Comment: Retired  Tobacco Use   Smoking status: Former    Types: Cigarettes    Quit date: 10/25/1995    Years since quitting: 25.9   Smokeless tobacco: Never   Tobacco comments:    Maryann Conners [phone]  Substance and Sexual Activity   Alcohol use: Yes    Alcohol/week: 0.0 standard drinks    Comment: occasional   Drug use: No   Sexual activity: Yes    Birth control/protection: Post-menopausal  Other Topics Concern   Not on file  Social History Narrative   Not on file   Social Determinants of Health   Financial Resource Strain: Not on file  Food Insecurity: Not on file  Transportation Needs: Not on file  Physical Activity: Not on file  Stress: Not on file  Social Connections: Not on file     Family History: The patient's family  history includes Breast cancer in her paternal aunt; Colon cancer in her maternal grandfather; Heart attack in her brother and father; Lung cancer in her mother; Prostate cancer in her paternal grandfather; Stomach cancer in her father.  ROS:   Please see the history of present illness.     EKGs/Labs/Other Studies Reviewed:    EKG:  The ekg ordered today demonstrates normal sinus rhythm, no ST-T wave changes.  Rate 61, PR interval 168 ms, QRS duration 92 ms.  Recent Labs: 07/22/2021: ALT 16; BUN 10; Creat 0.94; Hemoglobin 11.5; Magnesium 2.0; Platelets 155; Potassium  4.3; Sodium 138 09/16/2021: TSH 18.77   Recent Lipid Panel Lab Results  Component Value Date/Time   CHOL 201 (H) 07/22/2021 10:17 AM   TRIG 64 07/22/2021 10:17 AM   HDL 83 07/22/2021 10:17 AM   LDLCALC 103 (H) 07/22/2021 10:17 AM    Physical Exam:    VS:  BP (!) 170/82   Pulse 61   Ht 5\' 1"  (1.549 m)   Wt 142 lb 12.8 oz (64.8 kg)   SpO2 98%   BMI 26.98 kg/m    No data found.  Wt Readings from Last 3 Encounters:  09/28/21 142 lb 12.8 oz (64.8 kg)  09/16/21 151 lb 12.8 oz (68.9 kg)  09/11/21 148 lb 12.8 oz (67.5 kg)     GEN:  Well nourished, well developed in no acute distress HEENT: Normal NECK: No JVD; No carotid bruits CARDIAC: RRR, no murmurs, rubs, gallops RESPIRATORY:  Clear to auscultation without rales, wheezing or rhonchi  ABDOMEN: Soft, non-tender, non-distended MUSCULOSKELETAL: No edema; No deformity  SKIN: Warm and dry NEUROLOGIC:  Alert and oriented PSYCHIATRIC:  Normal affect     ASSESSMENT AND PLAN   Hypertension, uncontrolled -Stop bisoprolol.  Start amlodipine 5mg  daily.   -Continue Benicar (pt not sure if it's working).  May consider changing to valsartan if blood pressure remains elevated at follow-up. -Recommend wearing compression stockings daily. -Goal BP is ~140s-150s for her.  Recommend DASH diet (high in vegetables, fruits, low-fat dairy products, whole grains, poultry, fish,  and nuts and low in sweets, sugar-sweetened beverages, and red meats), salt restriction and increase physical activity.  Syncope -Thought secondary to orthostatic intolerance -Taken off HCTZ; compression stockings recommended - EP did not think pacemaker was indicated (no adv heart block on Zio or sustained arrhythmias) -EP mentioned evaluating the vertebral circulation with duplex - will order today  Disposition - Follow-up in 6 weeks to f/u on BP control.  Will f/u on carotid/vertebral duplex.          Medication Adjustments/Labs and Tests Ordered: Current medicines are reviewed at length with the patient today.  Concerns regarding medicines are outlined above.  Orders Placed This Encounter  Procedures   EKG 12-Lead   VAS US CAROTID    Meds ordered this encounter  Medications   amLODipine (NORVASC) 5 MG tablet    Sig: Take 1 tablet (5 mg total) by mouth daily.    Dispense:  90 tablet    Refill:  3      Signed, Gaston Islam  09/28/2021 11:33 AM    [facility]

## 2021-09-28 ENCOUNTER — Other Ambulatory Visit: Payer: Self-pay

## 2021-09-28 ENCOUNTER — Encounter: Payer: Self-pay | Admitting: Physician Assistant

## 2021-09-28 ENCOUNTER — Ambulatory Visit: Payer: Medicare PPO | Admitting: Physician Assistant

## 2021-09-28 VITALS — BP 170/82 | HR 61 | Ht 61.0 in | Wt 142.8 lb

## 2021-09-28 DIAGNOSIS — R001 Bradycardia, unspecified: Secondary | ICD-10-CM | POA: Diagnosis not present

## 2021-09-28 DIAGNOSIS — I10 Essential (primary) hypertension: Secondary | ICD-10-CM | POA: Diagnosis not present

## 2021-09-28 DIAGNOSIS — R55 Syncope and collapse: Secondary | ICD-10-CM | POA: Diagnosis not present

## 2021-09-28 MED ORDER — AMLODIPINE BESYLATE 5 MG PO TABS: 5.0000 mg | ORAL_TABLET | Freq: Every day | ORAL | 3 refills | Status: DC

## 2021-09-28 NOTE — Patient Instructions (Signed)
Medication Instructions:  Stop Bisoprolol. Start Amlodipine 5 mg ( Take 1 Tablet Daily). *If you need a refill on your cardiac medications before your next appointment, please call your pharmacy*   Lab Work: No labs If you have labs (blood work) drawn today and your tests are completely normal, you will receive your results only by: [place] (if you have MyChart) OR A paper copy in the mail If you have any lab test that is abnormal or we need to change your treatment, we will call you to review the results.   Testing/Procedures: [place], [address]. Your physician has requested that you have a carotid duplex. This test is an ultrasound of the carotid arteries in your neck. It looks at blood flow through these arteries that supply the brain with blood. Allow one hour for this exam. There are no restrictions or special instructions.    Follow-Up: At [facility], you and your health needs are our priority.  As part of our continuing mission to provide you with exceptional heart care, we have created designated Provider Care Teams.  These Care Teams include your primary Cardiologist (physician) and Advanced Practice Providers (APPs -  Physician Assistants and Nurse Practitioners) who all work together to provide you with the care you need, when you need it.  We recommend signing up for the patient portal called "MyChart".  Sign up information is provided on this After Visit Summary.  MyChart is used to connect with patients for Virtual Visits (Telemedicine).  Patients are able to view lab/test results, encounter notes, upcoming appointments, etc.  Non-urgent messages can be sent to your provider as well.   To learn more about what you can do with MyChart, go to [email or web address].    Your next appointment:   6 week(s)  The format for your next appointment:   In Person  Provider:   Caron Presume, PA-C    Then, Donato Heinz, MD will plan to see you  again in 6 month(s).    Other Instructions Recommended to wear compression stocking during the day.

## 2021-10-09 ENCOUNTER — Other Ambulatory Visit: Payer: Self-pay

## 2021-10-09 ENCOUNTER — Ambulatory Visit: Payer: Medicare PPO | Admitting: Adult Health

## 2021-10-09 ENCOUNTER — Ambulatory Visit (HOSPITAL_COMMUNITY)
Admission: RE | Admit: 2021-10-09 | Discharge: 2021-10-09 | Disposition: A | Discharge status: Home / Self Care | Payer: Medicare PPO | Source: Ambulatory Visit | Attending: Cardiovascular Disease | Admitting: Cardiovascular Disease

## 2021-10-09 DIAGNOSIS — R55 Syncope and collapse: Secondary | ICD-10-CM | POA: Diagnosis not present

## 2021-10-09 DIAGNOSIS — I10 Essential (primary) hypertension: Secondary | ICD-10-CM | POA: Diagnosis not present

## 2021-10-09 DIAGNOSIS — R001 Bradycardia, unspecified: Secondary | ICD-10-CM | POA: Diagnosis not present

## 2021-10-14 ENCOUNTER — Telehealth: Payer: Self-pay

## 2021-10-14 DIAGNOSIS — Z79899 Other long term (current) drug therapy: Secondary | ICD-10-CM | POA: Diagnosis not present

## 2021-10-14 DIAGNOSIS — M0609 Rheumatoid arthritis without rheumatoid factor, multiple sites: Secondary | ICD-10-CM | POA: Diagnosis not present

## 2021-10-14 DIAGNOSIS — M2550 Pain in unspecified joint: Secondary | ICD-10-CM | POA: Diagnosis not present

## 2021-10-14 DIAGNOSIS — M150 Primary generalized (osteo)arthritis: Secondary | ICD-10-CM | POA: Diagnosis not present

## 2021-10-14 DIAGNOSIS — E663 Overweight: Secondary | ICD-10-CM | POA: Diagnosis not present

## 2021-10-14 DIAGNOSIS — Z6826 Body mass index (BMI) 26.0-26.9, adult: Secondary | ICD-10-CM | POA: Diagnosis not present

## 2021-10-14 NOTE — Telephone Encounter (Addendum)
Called patient to advise test results normal.----- Message from Warren Lacy, PA-C sent at 10/09/2021  1:47 PM EST ----- Good news.  Normal flow in both carotids, vertebral and subclavian arteries.  Continue to wear compression stockings to help orthostatic hypotension/dizziness.

## 2021-10-18 ENCOUNTER — Encounter: Payer: Self-pay | Admitting: Internal Medicine

## 2021-10-18 NOTE — Progress Notes (Signed)
Future Appointments  Date Time Provider Department  10/19/2021 10:30 AM [name], MD [facility]  11/12/2021  8:25 AM Warren Lacy, PA-C [facility]  01/19/2022           Wellness 11:00 AM Magda Bernheim, NP [facility]  01/27/2022  2:00 PM Donato Heinz, MD [facility]  07/22/2022             CPE 10:00 AM [name], MD [facility]    History of Present Illness:     Patient is a very nice 75 yo MWF with hx/o RA & long standing  HTN on multiple meds who this year developed orthostatic intolerance to meds and her diuretic was d/c'd , then her beta blocker with labile rebound in elevated BPs. Patient was seen by Cardiology and Amlodipine was added with her Olmesartan. Zio patch did not reveal arrhythmia and echocardiogram  & Carotid U/S found no major abnormalities.       Patient recent Thyroid measurements have been out of range. Patient is also c/o diffuse hair loss /shedding.  TSH 0.40 on April 08, 2021 - TSH  27.59 on Sept 14, 2022 and TSH 18.77 on Sep 16, 2021  &   L-thyroxine was increased from 137 up to 150 mcg.    Note patient has been strongly advised to stop her BIOTIN Supplements.   Medications    alendronate (FOSAMAX) 70 MG tablet, TAKE 1 TABLET ONE TIME WEEKLY    levothyroxine  150 MCG tablet, Take  1 tablet  Daily       amLODipine (NORVASC) 5 MG tablet, Take 1 tablet  daily.   olmesartan (BENICAR) 20 MG tablet, TAKE 1 TABLET EVERY DAY     aspirin 81 MG tablet, Take daily. 3 a week   folic acid ( 1 MG tablet, Take 3 mg daily.   VITAMIN C 1000 MG tablet, Take 1,000 mg  daily. 3 a week   VITAMIN D 5,000 Units , Takes 3 times a week   gabapentin 600 MG tablet, TAKE 1/2 TO 1 TABLET 2-3 x /day  DAILY AS NEEDED FOR PAIN   hydroxychloroquine (PLAQUENIL) 200 MG tablet, Take  2 ( times daily.   MELATONIN , Take 1 tablet b at bedtime.   methotrexate 50 MG/2ML injec, Inject 25 mg into the skin once a week. Thursdays   omeprazole  40 MG capsule, TAKE 1 CAPSULE TWICE DAILY  as  needed   traZODone 150 MG tablet, Take  1 tablet  1 hour  before Bedtime  as needed f   zinc gluconate 50 MG tablet, Take  daily. 3 times a week  Problem list She has Essential hypertension; Rheumatoid arthritis ([place]); Hyperlipidemia, mixed; Gastroesophageal reflux disease without esophagitis; Personal history of colonic polyps; Vitamin D deficiency; Abnormal glucose; Medication management; Hypothyroidism; Osteoporosis; S/P shoulder replacement, right; Hyperparathyroidism ([place]); Overweight (BMI 25.0-29.9); Insomnia; Neuropathy; Aortic atherosclerosis ([place]) by CXR in 2021; Depression, major, single episode, in partial remission ([place]); COVID-19; Hyponatremia; COVID-19 virus infection; and Staphylococcus epidermidis bacteremia on their problem list.   Observations/Objective:  BP 139/71   Pulse 70   Temp 97.8 F (36.6 C)   Resp 16   Ht 5\' 1"  (1.549 m)   Wt 142 lb 9.6 oz (64.7 kg)   SpO2 97%   BMI 26.94 kg/m   HEENT - WNL. Neck - supple.  Chest - Clear equal BS. Cor - Nl HS. RRR w/o sig MGR. PP 1(+). No edema. MS- FROM w/o deformities.  Gait Nl. Neuro -  Nl w/o focal abnormalities. Skin - Diffuse scalp hair thinning   Assessment and Plan:   1. Labile hypertension  - CBC with Differential/Platelet - COMPLETE METABOLIC PANEL WITH GFR - TSH  2. Postural hypotension  - CBC with Differential/Platelet - COMPLETE METABOLIC PANEL WITH GFR - TSH - Magnesium  3. Hypothyroidism, unspecified type  - TSH  4. Drug-related hair loss  - Zinc - Iron, Total/Total Iron Binding Cap  5. Iron deficiency anemia secondary to inadequate dietary iron intake  - Iron, Total/Total Iron Binding Cap  6. Zinc deficiency  - Zinc  7. Hair loss disorder  - Zinc - Iron, Total/Total Iron Binding Cap - Testosterone, Total, LC/MS/MS   Follow Up Instructions:        I discussed the assessment and treatment plan with the patient. The patient was provided an opportunity to ask questions and  all were answered. The patient agreed with the plan and demonstrated an understanding of the instructions.       The patient was advised to call back or seek an in-person evaluation if the symptoms worsen or if the condition fails to improve as anticipated.    Kirtland Bouchard, MD

## 2021-10-19 ENCOUNTER — Encounter: Payer: Self-pay | Admitting: Internal Medicine

## 2021-10-19 ENCOUNTER — Other Ambulatory Visit: Payer: Self-pay

## 2021-10-19 ENCOUNTER — Ambulatory Visit: Payer: Medicare PPO | Admitting: Internal Medicine

## 2021-10-19 VITALS — BP 139/71 | HR 70 | Temp 97.8°F | Resp 16 | Ht 61.0 in | Wt 142.6 lb

## 2021-10-19 DIAGNOSIS — E60 Dietary zinc deficiency: Secondary | ICD-10-CM | POA: Diagnosis not present

## 2021-10-19 DIAGNOSIS — I951 Orthostatic hypotension: Secondary | ICD-10-CM | POA: Diagnosis not present

## 2021-10-19 DIAGNOSIS — R0989 Other specified symptoms and signs involving the circulatory and respiratory systems: Secondary | ICD-10-CM | POA: Diagnosis not present

## 2021-10-19 DIAGNOSIS — E039 Hypothyroidism, unspecified: Secondary | ICD-10-CM | POA: Diagnosis not present

## 2021-10-19 DIAGNOSIS — L659 Nonscarring hair loss, unspecified: Secondary | ICD-10-CM

## 2021-10-19 DIAGNOSIS — T50905A Adverse effect of unspecified drugs, medicaments and biological substances, initial encounter: Secondary | ICD-10-CM

## 2021-10-19 DIAGNOSIS — D508 Other iron deficiency anemias: Secondary | ICD-10-CM | POA: Diagnosis not present

## 2021-10-19 DIAGNOSIS — L658 Other specified nonscarring hair loss: Secondary | ICD-10-CM

## 2021-10-19 LAB — TSH: TSH: 0.1 mIU/L — ABNORMAL LOW (ref 0.40–4.50)

## 2021-10-19 LAB — COMPLETE METABOLIC PANEL WITH GFR
AG Ratio: 2.1 (calc) (ref 1.0–2.5)
ALT: 15 U/L (ref 6–29)
AST: 26 U/L (ref 10–35)
Albumin: 4.2 g/dL (ref 3.6–5.1)
Alkaline phosphatase (APISO): 80 U/L (ref 37–153)
BUN: 8 mg/dL (ref 7–25)
CO2: 31 mmol/L (ref 20–32)
Calcium: 10.9 mg/dL — ABNORMAL HIGH (ref 8.6–10.4)
Chloride: 104 mmol/L (ref 98–110)
Creat: 0.89 mg/dL (ref 0.60–1.00)
Globulin: 2 g/dL (calc) (ref 1.9–3.7)
Glucose, Bld: 80 mg/dL (ref 65–99)
Potassium: 4.1 mmol/L (ref 3.5–5.3)
Sodium: 141 mmol/L (ref 135–146)
Total Bilirubin: 0.7 mg/dL (ref 0.2–1.2)
Total Protein: 6.2 g/dL (ref 6.1–8.1)
eGFR: 68 mL/min/{1.73_m2} (ref >=60)

## 2021-10-19 LAB — CBC WITH DIFFERENTIAL/PLATELET
Absolute Monocytes: 434 cells/uL (ref 200–950)
Basophils Absolute: 52 cells/uL (ref 0–200)
Basophils Relative: 1.2 %
Eosinophils Absolute: 224 cells/uL (ref 15–500)
Eosinophils Relative: 5.2 %
HCT: 39.5 % (ref 35.0–45.0)
Hemoglobin: 12.9 g/dL (ref 11.7–15.5)
Lymphs Abs: 1273 cells/uL (ref 850–3900)
MCH: 30.6 pg (ref 27.0–33.0)
MCHC: 32.7 g/dL (ref 32.0–36.0)
MCV: 93.8 fL (ref 80.0–100.0)
MPV: 14 fL — ABNORMAL HIGH (ref 7.5–12.5)
Monocytes Relative: 10.1 %
Neutro Abs: 2318 cells/uL (ref 1500–7800)
Neutrophils Relative %: 53.9 %
Platelets: 148 10*3/uL (ref 140–400)
RBC: 4.21 10*6/uL (ref 3.80–5.10)
RDW: 12.9 % (ref 11.0–15.0)
Total Lymphocyte: 29.6 %
WBC: 4.3 10*3/uL (ref 3.8–10.8)

## 2021-10-19 LAB — MAGNESIUM: Magnesium: 2.2 mg/dL (ref 1.5–2.5)

## 2021-10-19 NOTE — Patient Instructions (Signed)

## 2021-10-20 NOTE — Progress Notes (Signed)
============================================================ °-   Test results slightly outside the reference range are not unusual. If there is anything important, I will review this with you,  otherwise it is considered normal test values.  If you have further questions,  please do not hesitate to contact me at the office or via My Chart.  ============================================================ ============================================================  -  CBC - back to Normal RBC - No anemia.                                                     & Iron levels are back in Normal Range - Great  ! ============================================================ ============================================================  -  TSH - slightly low means Thyroid hormone is                                                            in borderline high normal range now &                          I suggest keeping the dose same for now. ============================================================ ============================================================  -  All Else - CBC - Kidneys - Electrolytes - Liver &  Magnesium   - all  Normal / OK ===========================================================

## 2021-10-22 ENCOUNTER — Encounter: Payer: Self-pay | Admitting: Internal Medicine

## 2021-10-23 LAB — IRON, TOTAL/TOTAL IRON BINDING CAP
%SAT: 29 % (calc) (ref 16–45)
Iron: 83 ug/dL (ref 45–160)
TIBC: 291 mcg/dL (calc) (ref 250–450)

## 2021-10-23 LAB — ZINC: Zinc: 67 ug/dL (ref 60–130)

## 2021-10-23 LAB — TESTOSTERONE, TOTAL, LC/MS/MS: Testosterone, Total, LC-MS-MS: 34 ng/dL (ref 2–45)

## 2021-11-11 NOTE — Progress Notes (Signed)
Cardiology Office Note:    Date:  11/12/2021   ID:  [name], [name] 02/13/1946, MRN [id number]  PCP:  [name], [place] Cardiologist: Donato Heinz, MD   Reason for visit: Hypertension follow-up  History of Present Illness:    [name] is a 76 y.o. female with a hx of hypertension, hyperlipidemia, hypothyroidism, peptic ulcer disease, rheumatoid arthritis. She has a history of syncope with position changes that was thought to be due to orthostatic intolerance.  HCTZ was discontinued.  Compression stockings recommended.  Duplex showed normal flow in the bilateral vertebral arteries and no significant carotid disease.   2-week Zio patch showed rare short runs SVT and average heart rate of 58, reported dizziness with heart rate 48 (sinus brady).  She saw EP who did not think syncope was related to her bradycardia.  EP recommended permissive hypertension to avoid hypotension that would precipitate more presyncope/syncope.  Echocardiogram 06/08/2021 showed normal biventricular function, grade 2 diastolic dysfunction, severe left atrial dilatation, no significant valvular disease  Lexiscan stress test 2016 with no ischemia.  I last saw her in November 2022 with blood pressures 160s 170s over 90s to 100s.  She mentioned squeezing chest pain, occurring at rest or with exertion, nothing making it better or worse.  We stopped bisoprolol (avoid brady) and started amlodipine.  Today, patient is doing well.  Chest pain resolved with blood pressure control.  Her blood pressure log shows blood pressure 100s to 120s/60s.  She states half the time when she gets up, she still feels dizzy but is able to recover quickly.  She tried compression stockings which did not make a difference in her symptoms.  Heart rate averaging the 50s.  She denies shortness of breath, palpitations and leg swelling.    Past Medical History:  Diagnosis Date   Anemia    Hx of    Arthritis     Calculus of bile duct without mention of cholecystitis or obstruction    Cataract    GERD (gastroesophageal reflux disease)    H. pylori infection    Hx of    Heart murmur    Hyperlipidemia    Hypertension    Hypothyroid    PUD (peptic ulcer disease)    Rheumatoid arthritis(714.0)     Past Surgical History:  Procedure Laterality Date   AV FISTULA REPAIR     BLADDER SUSPENSION     BREAST CYST EXCISION Left    BUNIONECTOMY     CARPAL TUNNEL RELEASE     CHOLECYSTECTOMY  09/07/2011   CYSTECTOMY     left breast   ERCP  09/29/2011   Procedure: ENDOSCOPIC RETROGRADE CHOLANGIOPANCREATOGRAPHY (ERCP);  Surgeon: Inda Castle, MD;  Location: [facility];  Service: Endoscopy;  Laterality: N/A;   PILONIDAL CYST EXCISION     TENOLYSIS  10/26/2011   Procedure: TENDON SHEATH RELEASE/TENOLYSIS;  Surgeon: Wynonia Sours, MD;  Location: [facility];  Service: Orthopedics;  Laterality: Right;  tenosynovectomy removal superficialis slip right index finger   TONSILLECTOMY     TOTAL SHOULDER REPLACEMENT Right 02/22/2014   Dr. Enrigue Catena at [facility]     ovaries not removed    Current Medications: Current Meds  Medication Sig   alendronate (FOSAMAX) 70 MG tablet TAKE 1 TABLET ONE TIME WEEKLY AS DIRECTED (SEE PACKAGE FOR ADDITIONAL INSTRUCTIONS)   amLODipine (NORVASC) 2.5 MG tablet Take 1 tablet (2.5 mg  total) by mouth daily.   Ascorbic Acid (VITAMIN C) 1000 MG tablet Take 1,000 mg by mouth daily. 3 a week   aspirin 81 MG tablet Take 81 mg by mouth daily. 3 a week   Cholecalciferol (VITAMIN D PO) Take 5,000 Units by mouth See admin instructions. Takes 3 times a week   folic acid (FOLVITE) 1 MG tablet Take 3 mg by mouth daily.   gabapentin (NEURONTIN) 600 MG tablet TAKE 1/2 TO 1 TABLET 2 TO 3 TIMES DAILY AS NEEDED FOR PAIN   hydroxychloroquine (PLAQUENIL) 200 MG tablet Take 200 mg by mouth 2 (two) times daily.   levothyroxine  (SYNTHROID) 150 MCG tablet Take  1 tablet  Daily  on an empty stomach with only water for 30 minutes &  No Antacid meds, Calcium or Magnesium for 4 hours & avoid Biotin   MELATONIN PO Take 1 tablet by mouth at bedtime.   methotrexate 50 MG/2ML injection Inject 25 mg into the skin once a week. Thursdays   olmesartan (BENICAR) 20 MG tablet TAKE 1 TABLET EVERY DAY FOR BLOOD PRESSURE   omeprazole (PRILOSEC) 40 MG capsule TAKE 1 CAPSULE TWICE DAILY FOR ACID REFLUX (Patient taking differently: Take 40 mg by mouth as needed.)   traZODone (DESYREL) 150 MG tablet Take  1 tablet  1 hour  before Bedtime  as needed for Sleep   zinc gluconate 50 MG tablet Take 50 mg by mouth daily. 3 times a week   [DISCONTINUED] amLODipine (NORVASC) 5 MG tablet Take 1 tablet (5 mg total) by mouth daily.     Allergies:   Ace inhibitors, Ambien [zolpidem], Crestor [rosuvastatin], Leflunomide, Pravastatin, Prozac [fluoxetine hcl], and Zoloft [sertraline hcl]   Social History   Socioeconomic History   Marital status: Married    Spouse name: Not on file   Number of children: 2   Years of education: Not on file   Highest education level: Not on file  Occupational History   Occupation: retired    Fish farm manager: RETIRED    Comment: Retired  Tobacco Use   Smoking status: Former    Types: Cigarettes    Quit date: 10/25/1995    Years since quitting: 26.0   Smokeless tobacco: Never   Tobacco comments:    Maryann Conners [phone]  Substance and Sexual Activity   Alcohol use: Yes    Alcohol/week: 0.0 standard drinks    Comment: occasional   Drug use: No   Sexual activity: Yes    Birth control/protection: Post-menopausal  Other Topics Concern   Not on file  Social History Narrative   Not on file   Social Determinants of Health   Financial Resource Strain: Not on file  Food Insecurity: Not on file  Transportation Needs: Not on file  Physical Activity: Not on file  Stress: Not on file  Social Connections: Not on file     Family  History: The patient's family history includes Breast cancer in her paternal aunt; Colon cancer in her maternal grandfather; Heart attack in her brother and father; Lung cancer in her mother; Prostate cancer in her paternal grandfather; Stomach cancer in her father.  ROS:   Please see the history of present illness.     EKGs/Labs/Other Studies Reviewed:    Recent Labs: 10/19/2021: ALT 15; BUN 8; Creat 0.89; Hemoglobin 12.9; Magnesium 2.2; Platelets 148; Potassium 4.1; Sodium 141; TSH 0.10   Recent Lipid Panel Lab Results  Component Value Date/Time   CHOL 201 (H) 07/22/2021 10:17 AM  TRIG 64 07/22/2021 10:17 AM   HDL 83 07/22/2021 10:17 AM   LDLCALC 103 (H) 07/22/2021 10:17 AM    Physical Exam:    VS:  BP 130/68 (BP Location: Left Arm)    Pulse (!) 55    Ht 5\' 1"  (1.549 m)    Wt 144 lb 3.2 oz (65.4 kg)    SpO2 98%    BMI 27.25 kg/m    No data found.  Wt Readings from Last 3 Encounters:  11/12/21 144 lb 3.2 oz (65.4 kg)  10/19/21 142 lb 9.6 oz (64.7 kg)  09/28/21 142 lb 12.8 oz (64.8 kg)     GEN:  Well nourished, well developed in no acute distress HEENT: Normal NECK: No JVD; No carotid bruits CARDIAC: RRR, no murmurs, rubs, gallops RESPIRATORY:  Clear to auscultation without rales, wheezing or rhonchi  ABDOMEN: Soft, non-tender, non-distended MUSCULOSKELETAL: No edema; No deformity  SKIN: Warm and dry NEUROLOGIC:  Alert and oriented PSYCHIATRIC:  Normal affect     ASSESSMENT AND PLAN    Hypertension, controlled -Goal BP is ~130s-140s for her given hx of orthostatic intolerance. -Decrease amlodipine to 2.5 mg daily.  Continue current dose of Benicar.  Syncope, no recurrence -Thought secondary to orthostatic intolerance -Taken off HCTZ -EP did not think pacemaker was indicated (no adv heart block on Zio or sustained arrhythmias) -normal vertebral circulation on duplex   Disposition - Follow-up in 1 year with Dr. Gardiner Rhyme.        Medication Adjustments/Labs  and Tests Ordered: Current medicines are reviewed at length with the patient today.  Concerns regarding medicines are outlined above.  No orders of the defined types were placed in this encounter.  Meds ordered this encounter  Medications   amLODipine (NORVASC) 2.5 MG tablet    Sig: Take 1 tablet (2.5 mg total) by mouth daily.    Dispense:  180 tablet    Refill:  3    Patient Instructions  Medication Instructions:  Decrease Amlodipine to 2.5 mg (1 Tablet Daily). *If you need a refill on your cardiac medications before your next appointment, please call your pharmacy*   Lab Work: No labs If you have labs (blood work) drawn today and your tests are completely normal, you will receive your results only by: [place] (if you have MyChart) OR A paper copy in the mail If you have any lab test that is abnormal or we need to change your treatment, we will call you to review the results.   Testing/Procedures: No Testing   Follow-Up: At  [facility], you and your health needs are our priority.  As part of our continuing mission to provide you with exceptional heart care, we have created designated Provider Care Teams.  These Care Teams include your primary Cardiologist (physician) and Advanced Practice Providers (APPs -  Physician Assistants and Nurse Practitioners) who all work together to provide you with the care you need, when you need it.  We recommend signing up for the patient portal called "MyChart".  Sign up information is provided on this After Visit Summary.  MyChart is used to connect with patients for Virtual Visits (Telemedicine).  Patients are able to view lab/test results, encounter notes, upcoming appointments, etc.  Non-urgent messages can be sent to your provider as well.   To learn more about what you can do with MyChart, go to [email or web address].    Your next appointment:   1 year(s)  The format for your next appointment:   In Person  Provider:    Donato Heinz, MD     Other Instructions Goal Blood Pressure 130-140.    Signed, Warren Lacy, PA-C  11/12/2021 9:07 AM    [place]

## 2021-11-12 ENCOUNTER — Encounter: Payer: Self-pay | Admitting: Physician Assistant

## 2021-11-12 ENCOUNTER — Ambulatory Visit: Payer: Medicare PPO | Admitting: Physician Assistant

## 2021-11-12 ENCOUNTER — Other Ambulatory Visit: Payer: Self-pay | Admitting: Internal Medicine

## 2021-11-12 ENCOUNTER — Other Ambulatory Visit: Payer: Self-pay

## 2021-11-12 VITALS — BP 130/68 | HR 55 | Ht 61.0 in | Wt 144.2 lb

## 2021-11-12 DIAGNOSIS — I10 Essential (primary) hypertension: Secondary | ICD-10-CM | POA: Diagnosis not present

## 2021-11-12 DIAGNOSIS — R55 Syncope and collapse: Secondary | ICD-10-CM | POA: Diagnosis not present

## 2021-11-12 DIAGNOSIS — R001 Bradycardia, unspecified: Secondary | ICD-10-CM

## 2021-11-12 MED ORDER — AMLODIPINE BESYLATE 2.5 MG PO TABS: 2.5000 mg | ORAL_TABLET | Freq: Every day | ORAL | 3 refills | Status: DC

## 2021-11-12 NOTE — Patient Instructions (Signed)
Medication Instructions:  Decrease Amlodipine to 2.5 mg (1 Tablet Daily). *If you need a refill on your cardiac medications before your next appointment, please call your pharmacy*   Lab Work: No labs If you have labs (blood work) drawn today and your tests are completely normal, you will receive your results only by: [place] (if you have MyChart) OR A paper copy in the mail If you have any lab test that is abnormal or we need to change your treatment, we will call you to review the results.   Testing/Procedures: No Testing   Follow-Up: At [facility], you and your health needs are our priority.  As part of our continuing mission to provide you with exceptional heart care, we have created designated Provider Care Teams.  These Care Teams include your primary Cardiologist (physician) and Advanced Practice Providers (APPs -  Physician Assistants and Nurse Practitioners) who all work together to provide you with the care you need, when you need it.  We recommend signing up for the patient portal called "MyChart".  Sign up information is provided on this After Visit Summary.  MyChart is used to connect with patients for Virtual Visits (Telemedicine).  Patients are able to view lab/test results, encounter notes, upcoming appointments, etc.  Non-urgent messages can be sent to your provider as well.   To learn more about what you can do with MyChart, go to [email or web address].    Your next appointment:   1 year(s)  The format for your next appointment:   In Person  Provider:   Donato Heinz, MD     Other Instructions Goal Blood Pressure 130-140.

## 2021-12-14 ENCOUNTER — Other Ambulatory Visit: Payer: Self-pay

## 2021-12-14 DIAGNOSIS — Z1211 Encounter for screening for malignant neoplasm of colon: Secondary | ICD-10-CM

## 2021-12-14 LAB — POC HEMOCCULT BLD/STL (HOME/3-CARD/SCREEN)
Card #2 Fecal Occult Blod, POC: NEGATIVE
Card #3 Fecal Occult Blood, POC: NEGATIVE
Fecal Occult Blood, POC: NEGATIVE

## 2021-12-15 DIAGNOSIS — Z1212 Encounter for screening for malignant neoplasm of rectum: Secondary | ICD-10-CM

## 2021-12-15 DIAGNOSIS — Z1211 Encounter for screening for malignant neoplasm of colon: Secondary | ICD-10-CM

## 2021-12-31 ENCOUNTER — Telehealth: Payer: Self-pay | Admitting: Internal Medicine

## 2021-12-31 NOTE — Chronic Care Management (AMB) (Signed)
°  Chronic Care Management   Note  12/31/2021 Name: HYUN REALI MRN: [id number] DOB: Jan 25, 1946  JILLAYNE WITTE is a 76 y.o. year old female who is a primary care patient of [name], MD. I reached out to Armond Hang by phone today in response to a referral sent by Ms. Sofie Hartigan Savin's PCP, [name], MD.   Ms. Koury was given information about Chronic Care Management services today including:  CCM service includes personalized support from designated clinical staff supervised by her physician, including individualized plan of care and coordination with other care providers 24/7 contact phone numbers for assistance for urgent and routine care needs. Service will only be billed when office clinical staff spend 20 minutes or more in a month to coordinate care. Only one practitioner may furnish and bill the service in a calendar month. The patient may stop CCM services at any time (effective at the end of the month) by phone call to the office staff.   Patient agreed to services and verbal consent obtained.   Follow up plan: PT AWARE OF DEDUCTIBLE    [place]

## 2022-01-04 ENCOUNTER — Telehealth: Payer: Self-pay

## 2022-01-04 NOTE — Telephone Encounter (Signed)
LM-01/04/22-Called patient and confirmed upcoming visit w/ cpp and went over precall questions.   Total time spent: 5 Minutes

## 2022-01-04 NOTE — Progress Notes (Signed)
Assessment and Plan:  [name] was seen today for acute visit.  Diagnoses and all orders for this visit:  Hypothyroidism, unspecified type Please take your thyroid medication greater than 30 min before breakfast, separated by at least 4 hours  from antacids, calcium, iron, and multivitamins.        -     CBC with Differential/Platelet -     COMPLETE METABOLIC PANEL WITH GFR -     TSH  Other fatigue/Iron deficiency anemia Continue to monitor and will treat pending lab results -     CBC with Differential/Platelet -     COMPLETE METABOLIC PANEL WITH GFR -     Iron, Total/Total Iron Binding Cap -     Ferritin  Medication management -     CBC with Differential/Platelet -     COMPLETE METABOLIC PANEL WITH GFR -     Iron, Total/Total Iron Binding Cap -     Ferritin -     TSH       Further disposition pending results of labs. Discussed med's effects and SE's.   Over 30 minutes of exam, counseling, chart review, and critical decision making was performed.   Future Appointments  Date Time Provider [place]  01/06/2022  1:30 PM Newton Pigg, [facility] None  01/19/2022 11:00 AM Magda Bernheim, NP [facility] None  07/22/2022 10:00 AM [name], MD [facility] None    ------------------------------------------------------------------------------------------------------------------   HPI BP 122/68    Pulse 69    Temp 97.7 F (36.5 C)    Wt 146 lb 3.2 oz (66.3 kg)    SpO2 98%    BMI 27.62 kg/m   76 y.o.female presents for extreme fatigue.  Has been sleeping most of the day. Her TSH has been elevated several times in the past. Currently on Levothyroxine 150 mcg daily.  She has also been noticing more irritability. Hair is falling out more.  Denies constipation since using Align probiotic.   Past Medical History:  Diagnosis Date   Anemia    Hx of    Arthritis    Calculus of bile duct without mention of cholecystitis or obstruction    Cataract    GERD  (gastroesophageal reflux disease)    H. pylori infection    Hx of    Heart murmur    Hyperlipidemia    Hypertension    Hypothyroid    PUD (peptic ulcer disease)    Rheumatoid arthritis(714.0)      Allergies  Allergen Reactions   Ace Inhibitors Cough   Ambien [Zolpidem] Other (See Comments)    Odd Feeling   Crestor [Rosuvastatin]    Leflunomide Other (See Comments)   Pravastatin    Prozac [Fluoxetine Hcl] Other (See Comments)    Decreased libido   Zoloft [Sertraline Hcl]     Current Outpatient Medications on File Prior to Visit  Medication Sig   alendronate (FOSAMAX) 70 MG tablet TAKE 1 TABLET ONE TIME WEEKLY AS DIRECTED (SEE PACKAGE FOR ADDITIONAL INSTRUCTIONS)   amLODipine (NORVASC) 2.5 MG tablet Take 1 tablet (2.5 mg total) by mouth daily.   Ascorbic Acid (VITAMIN C) 1000 MG tablet Take 1,000 mg by mouth daily. 3 a week   aspirin 81 MG tablet Take 81 mg by mouth daily. 3 a week   Cholecalciferol (VITAMIN D PO) Take 5,000 Units by mouth See admin instructions. Takes 3 times a week   folic acid (FOLVITE) 1 MG tablet Take 3 mg by mouth daily.   gabapentin (  NEURONTIN) 600 MG tablet TAKE 1/2 TO 1 TABLET 2 TO 3 TIMES DAILY AS NEEDED FOR PAIN   hydroxychloroquine (PLAQUENIL) 200 MG tablet Take 200 mg by mouth 2 (two) times daily.   levothyroxine (SYNTHROID) 150 MCG tablet Take  1 tablet  Daily  on an empty stomach with only water for 30 minutes &  No Antacid meds, Calcium or Magnesium for 4 hours & avoid Biotin   MELATONIN PO Take 1 tablet by mouth at bedtime.   methotrexate 50 MG/2ML injection Inject 25 mg into the skin once a week. Thursdays   olmesartan (BENICAR) 20 MG tablet TAKE 1 TABLET EVERY DAY FOR BLOOD PRESSURE   omeprazole (PRILOSEC) 40 MG capsule TAKE 1 CAPSULE TWICE DAILY FOR ACID REFLUX (Patient taking differently: Take 40 mg by mouth as needed.)   traZODone (DESYREL) 150 MG tablet Take  1 tablet  1 hour  before Bedtime  as needed for Sleep   zinc gluconate 50 MG  tablet Take 50 mg by mouth daily. 3 times a week   No current facility-administered medications on file prior to visit.    ROS: all negative except above.   Physical Exam:  BP 122/68    Pulse 69    Temp 97.7 F (36.5 C)    Wt 146 lb 3.2 oz (66.3 kg)    SpO2 98%    BMI 27.62 kg/m   General Appearance: Well nourished, in no apparent distress. Eyes: PERRLA, EOMs, conjunctiva no swelling or erythema Sinuses: No Frontal/maxillary tenderness ENT/Mouth: Ext aud canals clear, TMs without erythema, bulging. No erythema, swelling, or exudate on post pharynx.  Tonsils not swollen or erythematous. Hearing normal.  Neck: Supple, thyroid normal.  Respiratory: Respiratory effort normal, BS equal bilaterally without rales, rhonchi, wheezing or stridor.  Cardio: RRR with no MRGs. Brisk peripheral pulses without edema.  Abdomen: Soft, + BS.  Non tender, no guarding, rebound, hernias, masses. Lymphatics: Non tender without lymphadenopathy.  Musculoskeletal: Full ROM, 5/5 strength, normal gait.  Skin: Warm, dry without rashes, lesions, ecchymosis.  Neuro: Cranial nerves intact. Normal muscle tone, no cerebellar symptoms. Sensation intact.  Psych: Awake and oriented X 3, normal affect, Insight and Judgment appropriate.     Magda Bernheim, NP 1:52 PM [facility]

## 2022-01-05 ENCOUNTER — Encounter: Payer: Self-pay | Admitting: Nurse Practitioner

## 2022-01-05 ENCOUNTER — Other Ambulatory Visit: Payer: Self-pay

## 2022-01-05 ENCOUNTER — Ambulatory Visit (INDEPENDENT_AMBULATORY_CARE_PROVIDER_SITE_OTHER): Payer: Medicare PPO | Admitting: Nurse Practitioner

## 2022-01-05 VITALS — BP 122/68 | HR 69 | Temp 97.7°F | Wt 146.2 lb

## 2022-01-05 DIAGNOSIS — E039 Hypothyroidism, unspecified: Secondary | ICD-10-CM | POA: Diagnosis not present

## 2022-01-05 DIAGNOSIS — R5383 Other fatigue: Secondary | ICD-10-CM

## 2022-01-05 DIAGNOSIS — D508 Other iron deficiency anemias: Secondary | ICD-10-CM | POA: Diagnosis not present

## 2022-01-05 DIAGNOSIS — Z79899 Other long term (current) drug therapy: Secondary | ICD-10-CM | POA: Diagnosis not present

## 2022-01-06 ENCOUNTER — Encounter: Payer: Self-pay | Admitting: Nurse Practitioner

## 2022-01-06 ENCOUNTER — Ambulatory Visit: Payer: Medicare PPO | Admitting: Pharmacist

## 2022-01-06 VITALS — BP 137/82 | HR 57

## 2022-01-06 DIAGNOSIS — E039 Hypothyroidism, unspecified: Secondary | ICD-10-CM

## 2022-01-06 DIAGNOSIS — Z79899 Other long term (current) drug therapy: Secondary | ICD-10-CM

## 2022-01-06 DIAGNOSIS — I10 Essential (primary) hypertension: Secondary | ICD-10-CM

## 2022-01-06 DIAGNOSIS — M810 Age-related osteoporosis without current pathological fracture: Secondary | ICD-10-CM

## 2022-01-06 DIAGNOSIS — R7309 Other abnormal glucose: Secondary | ICD-10-CM

## 2022-01-06 DIAGNOSIS — T466X5A Adverse effect of antihyperlipidemic and antiarteriosclerotic drugs, initial encounter: Secondary | ICD-10-CM | POA: Insufficient documentation

## 2022-01-06 DIAGNOSIS — K219 Gastro-esophageal reflux disease without esophagitis: Secondary | ICD-10-CM

## 2022-01-06 DIAGNOSIS — E782 Mixed hyperlipidemia: Secondary | ICD-10-CM

## 2022-01-06 LAB — CBC WITH DIFFERENTIAL/PLATELET
Absolute Monocytes: 200 cells/uL (ref 200–950)
Basophils Absolute: 21 cells/uL (ref 0–200)
Basophils Relative: 0.6 %
Eosinophils Absolute: 161 cells/uL (ref 15–500)
Eosinophils Relative: 4.6 %
HCT: 37.8 % (ref 35.0–45.0)
Hemoglobin: 12.6 g/dL (ref 11.7–15.5)
Lymphs Abs: 1022 cells/uL (ref 850–3900)
MCH: 30.3 pg (ref 27.0–33.0)
MCHC: 33.3 g/dL (ref 32.0–36.0)
MCV: 90.9 fL (ref 80.0–100.0)
MPV: 13.6 fL — ABNORMAL HIGH (ref 7.5–12.5)
Monocytes Relative: 5.7 %
Neutro Abs: 2097 cells/uL (ref 1500–7800)
Neutrophils Relative %: 59.9 %
Platelets: 114 10*3/uL — ABNORMAL LOW (ref 140–400)
RBC: 4.16 10*6/uL (ref 3.80–5.10)
RDW: 13.1 % (ref 11.0–15.0)
Total Lymphocyte: 29.2 %
WBC: 3.5 10*3/uL — ABNORMAL LOW (ref 3.8–10.8)

## 2022-01-06 LAB — COMPLETE METABOLIC PANEL WITH GFR
AG Ratio: 2.2 (calc) (ref 1.0–2.5)
ALT: 18 U/L (ref 6–29)
AST: 31 U/L (ref 10–35)
Albumin: 4 g/dL (ref 3.6–5.1)
Alkaline phosphatase (APISO): 103 U/L (ref 37–153)
BUN: 10 mg/dL (ref 7–25)
CO2: 31 mmol/L (ref 20–32)
Calcium: 10.6 mg/dL — ABNORMAL HIGH (ref 8.6–10.4)
Chloride: 105 mmol/L (ref 98–110)
Creat: 0.94 mg/dL (ref 0.60–1.00)
Globulin: 1.8 g/dL (calc) — ABNORMAL LOW (ref 1.9–3.7)
Glucose, Bld: 91 mg/dL (ref 65–99)
Potassium: 4.5 mmol/L (ref 3.5–5.3)
Sodium: 141 mmol/L (ref 135–146)
Total Bilirubin: 0.7 mg/dL (ref 0.2–1.2)
Total Protein: 5.8 g/dL — ABNORMAL LOW (ref 6.1–8.1)
eGFR: 64 mL/min/{1.73_m2} (ref >=60)

## 2022-01-06 LAB — IRON, TOTAL/TOTAL IRON BINDING CAP
%SAT: 35 % (calc) (ref 16–45)
Iron: 106 ug/dL (ref 45–160)
TIBC: 303 mcg/dL (calc) (ref 250–450)

## 2022-01-06 LAB — FERRITIN: Ferritin: 73 ng/mL (ref 16–288)

## 2022-01-06 LAB — TSH: TSH: 0.02 mIU/L — ABNORMAL LOW (ref 0.40–4.50)

## 2022-01-07 NOTE — Progress Notes (Signed)
Pharmacist Visit  [name], [name] 2 years, Female  DOB: 02-27-1946  M: [phone] Care Team: Otto Herb  __________________________________________________ Clinical Summary Patient Risk: High Next CCM Follow Up: 04/13/2022 Next AWV: 01/19/22 Summary for PCP: [name] is a friendly 76 year old female who present for CCM initial visit over the phone. Pt complains of being tired, fatigued, and low TSH level. 1. Discussed TSH and new dose of levothyroxine. discussed proper administration of medication and encouraged patient to take around the same time each day 2. Discussed osteoporosis with patient and prevention of falls. Pt due for DEXA scan 3. Discussed blood pressure and syncopal episodes. Discussed importance of staying hydrated to help with dizziness. 4. Discussed possibilities of low blood sugar which could contribute to fatigue/tiredness. Encouraged pt to obtain glucometer so that she can occasionally check blood sugar when feeling symptomatic. 5. 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  Prior to outreach and patient consent for Chronic Care Management, I referred this patient for services after reviewing the nominated patient list or from a personal encounter with the patient.  I have personally reviewed this encounter including the documentation in this note and have collaborated with the care management provider regarding care management and care coordination activities to include development and update of the comprehensive care plan I am certifying that I agree with the content of this note and encounter as supervising physician.  Chronic Conditions Patient's Chronic Conditions: Hypertension (HTN), Gastroesophageal Reflux Disease (GERD), Hypothyroidism,  Osteopenia or Osteoporosis, Hyperlipidemia/Dyslipidemia (HLD), Diabetes (DM), Depression, Insomnia, Vitamin D deficiency, Aortic Atherosclerosis, Hyperparathyroidism, Neuropathy, RA, Prediabetes, Hyponatremia   Disease Assessments Current BP: 122/68 Current HR: 69 taken on: 01/05/2022 Previous BP: 130/68 Previous HR: 55 taken on: 11/09/2021 Weight: 144 BMI: 27.25 Last GFR: 68 taken on: 10/19/2021 Why did the patient present?: CCM initial visit Marital status?: Married Details: [name] Retired? Previous work?: Retired 12 years ago What does the patient do during the day?: PT does not do to much during day, pt likes to quilt and watch TV Lifestyle habits such as diet and exercise?: 2-3 cups of coffee per day and tea throughout the day Diet: Breakfast: Pt stopped eating eggs, eats cereal, sometimes skips breakfast. Lunch sandwich or soup, sometimes a salad Exercise: No activity, walk around the house. not in mood, thinking about it. Alcohol, tobacco, and illicit drug usage?: Alcohol: occasionally, a beer Tobacco: none, quit 2025 Illicit Drug use: none What is the patient's sleep pattern?: Trouble falling asleep, Other (with free form text) Details: difficulty falling asleep a couple of times a month Patient pleased with health care they are receiving?: Yes Family, occupational, and living circumstances relevant to overall health?: The patient's family history includes Breast cancer in her paternal aunt; Colon cancer in her maternal grandfather; Heart attack in her brother and father; Lung cancer in her mother; Prostate cancer in her paternal grandfather; Stomach cancer in her father. Name and location of Current pharmacy: [place], [place] Current Rx insurance plan: [employer] Are [facility] pharmacy services available where patient lives?: Yes Is patient disadvantaged to use [facility]?: Yes Does patient experience delays  in picking up medications due to transportation concerns (getting to pharmacy)?: No Any additional demeanor/mood notes?: Pt with history of syncopal episodes.  It has become more tolerable but patient still experiences dizziness in which patient has learned how to manage by holding on to sturdy object or sitting down. Pt raised concern about her hospitalization on 06/05/21 for COVID. She states on day of discharge she could not remember anything and has not been the same since that day. She was requesting information on what was given to her that day which I unfortunately was not able to see anything. I recommended that she discuss with office for  more information and access with MyChart  Hypertension (HTN) Assess this condition today?: Yes Is patient able to obtain BP reading today?: Yes BP today is: 137/82 Heart Rate is: 57 Goal: <140/90 mmHG Hypertension Stage: Stage 1 (SBP: 130-139 or DBP: 80-89) Is Patient checking BP at home?: Yes Pt. home BP readings are ranging: 120/67, 140/78, 118/69, 120/70, 127/73, 123/72, 120/61, 127/73, 105/61, 124/66, 115/69, 123/76     pulse 40-50s How often does patient miss taking their blood pressure medications?: Never Has patient experienced hypotension, dizziness, falls or bradycardia?: Yes Check present secondary causes (below) for HTN: hyperthyroidism We discussed: Targeting 150 minutes of aerobic activity per week, Other (provide details below) Details: Encouraged patient to increase water consumption to help with dizziness Assessment:: Controlled Drug: amLODipine (NORVASC) 2.5 MG tablet Take 1 tablet (2.5 mg total) by mouth daily Assessment: Appropriate, Effective, Safe, Accessible Drug: olmesartan (BENICAR) 20 MG tablet TAKE 1 TABLET EVERY DAY FOR BLOOD PRESSURE Assessment: Appropriate, Effective, Safe, Accessible Additional Info: Pt uses pill box to organize her meds Pharmacist Follow up: Follow up with home BP and office BP readings. Assess for  falls, etc  Hyperlipidemia/Dyslipidemia (HLD) Last Lipid panel on: 07/21/2021 TC (Goal<200): 201 LDL: 103 HDL (Goal>40): 83 TG (Goal<150): 64 ASCVD 10-year risk?is:: Intermediate (7.5%-20%) ASCVD Risk Score: 17.3% Assess this condition today?: Yes LDL Goal: <70 Has patient tried and failed any HLD Medications?: Yes Medications failed: pravastatin, rosuvastatin pravastatin: Intolerance rosuvastatin: Intolerance We discussed: Encouraged increasing fiber to a daily intake of 10-25g/day, How a diet high in plant sterols (fruits/vegetables/nuts/whole grains/legumes) may reduce your cholesterol., Other (provide details below) Details: Discussed [place] nuts and how helpful it is in decreasing LDL substantially Assessment:: Uncontrolled Drug: None Additional Info: Consider addition of ezetimibe for LDL lowering Plan to (other): Reach out to provider to add 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 Assessment:: Controlled Drug: None Additional Info: If patient having lows, may be worthwhile to check blood sugars Pt has had times when sugar would drop. 3-4 hours after drinking coffee. patient would get very weak. This has not happened in a while. Pt could benefit from a blood sugar monitor to check blood sugar Plan to (other): Reach out to provider to prescribe blood sugar monitor Pharmacist Follow up: Assess A1c and FBG  Depression Previous PHQ-9 Score: 2 Assess this condition today?: Yes HC Follow up: PHQ-9 and GAD-7 in  May  GERD Assess this condition today?: Yes How frequently do you have symptoms of GERD or reflux?: occasionally How long have you taken prescription medications for GERD?: Pt had been taking omeprazole since 2012 We discussed: Other  Details: Discussed the use of famotidine (Pepcid) as needed other than Omeprazole as needed Drug: omeprazole (PRILOSEC) 40 MG capsule TAKE 1 CAPSULE as needed for ACID REFLUX (pt uses ocassionally) Assessment: Query need Plan to Start: Famotidine 40mg  PRN for acid reflux Pharmacist Follow up: Assess heart burn symptoms at next visit  Osteopenia or Osteoporosis Current T-score: Left Forearm Radius -4.3, DualFemur Neck Right -2.0, DualFemur Total Mean -1.1 taken on: 06/07/2019 Previous T-score: Left Forearm Radius -2.8 DualFemur Neck Right -1.5, DualFemur Total Mean -0.9 taken on: 07/12/2016 Current Vitamin D 25-OH: 88 taken on: 07/21/2021 Previous Vitamin D 25-OH: 92 taken on: 01/20/2021 Assess this condition today?: Yes Patient has: Osteoporosis In the past 12 months, have you fallen?: Yes What were the circumstances?: got dizzy How many times have you fallen?: Pt doesnt keep track but hasn't fallen in a while Are there any stairs in or around the home?: No Is the home free of loose throw rugs in walkways, pet beds, electrical cords, etc.?: No Is there adequate lighting in your home to reduce the risk of falls?: Yes Risk Factors: PPI use, Hyperthyroidism Bisphosphonate start: 11/10/2013 We discussed: Weight bearing exercises (walking, light weights, resistance training), Proper administration of bisphosphonate, Fall prevention, Other Details: Encouraged patient to replace rug in kitchen with a non slip rug to prevent falls Assessment:: Controlled Drug: alendronate (FOSAMAX) 70 MG tablet TAKE 1 TABLET ONE TIME WEEKLY AS DIRECTED (SEE PACKAGE FOR ADDITIONAL INSTRUCTIONS Assessment: Appropriate, Effective, Safe, Accessible Plan to Order: Pt due for DEXA  scan HC Follow up: Fall assessment next month Pharmacist Follow up: Recommend order for DEXA scan,  Hypothyroidism Current TSH: 0.02 taken on: 01/05/2022 Previous TSH: 0.10 taken on: 10/19/2021 Current T4: 1.4 taken on: 12/16/2016 Current T3: 2.5 taken on: 12/26/2016 Assess this condition today?: Yes Patient has experienced the following symptoms: Fatigue, Other Details: Anxiety, night chills We discussed: Monitoring signs / symptoms of hyperthyroidism (weight loss, tachycardia, anxiety, increased sweating), Proper administration of levothyroxine (empty stomach, 30 min before breakfast and with no other meds / vitamins) Assessment:: Uncontrolled Drug: levothyroxine (SYNTHROID) 150 MCG tablet Take 1/2 tablet Monday and Thursday, take 1 tablet on other days on an empty stomach with only water for 30 minutes & No Antacid meds, Calcium or Magnesium for 4 hours & avoid Biotin Assessment: Appropriate, Effective, Query Safety Pharmacist Follow up: TSH recheck 3/14  Exercise, Diet and Non-Drug Coordination Needs Additional exercise counseling points. We discussed: decreasing sedentary behavior, targeting at least 150 minutes per week of moderate-intensity aerobic exercise. Additional diet counseling points. We discussed: aiming to consume at least 8 cups of water day Discussed Non-Drug Care Coordination Needs: Yes Does Patient have Medication financial barriers?: No  [facility] Health-Related Social Needs Screening Tool -  SDOH  ([email or web address]) What is your living situation today? (ref #1): I have a steady place to live Think about the place you live. Do you have problems with any of the following? (ref #2): None of the above Within the past 12 months, you worried that your food would run out before you got money to buy more (ref #3): Never true Within the past 12 months, the food you bought just didn't last and  you didn't have money to get more (ref #4): Never true In the past 12 months, has lack of reliable transportation kept you from medical appointments, meetings, work or from getting things needed for daily living? (ref #5): No In the past 12 months, has the electric, gas, oil, or water  company threatened to shut off services in your home? (ref #6): No How often does anyone, including family and friends, physically hurt you? (ref #7): Never (1) How often does anyone, including family and friends, insult or talk down to you? (ref #8): Never (1) How often does anyone, including friends and family, threaten you with harm? (ref #9): Never (1) How often does anyone, including family and friends, scream or curse at you? (ref #10): Never (1)  Engagement Notes [name] on 01/06/2022 05:09 PM CPP Chart Review: 25 min CPP Office Visit: 39 min CPP Office Visit Documentation: 39 min CPP Coordination of Care: 6 min [facility] Care Plan Completion: 26 Minutes CPP Care Plan Review: 18 min   Engagement Notes [name] on 01/06/2022 05:27 PM [facility] follow-up: Fall assessment April Depression/Anxiety assessment May (please obtain GAD-7 and PHQ-9)  Care Gaps: Need Colorectal Cancer Screening/ COLONOSCOPY : Pt not interested Zoster Vaccines: informed patient INFLUENZA VACCINE: Pt does not desire to take flu shots Covid: Pt not interested            [name], [name]  Clinical Pharmacist  [name]  [phone]

## 2022-01-12 DIAGNOSIS — M0609 Rheumatoid arthritis without rheumatoid factor, multiple sites: Secondary | ICD-10-CM | POA: Diagnosis not present

## 2022-01-18 NOTE — Progress Notes (Unsigned)
MEDICARE ANNUAL WELLNESS AND FOLLOW UP 3 MONTH  Assessment & Plan:   Essential hypertension Continue current medications: Olmesartan '40mg'$  daily , Rx Amlodapine 2.'5mg'$  night.  Check blood pressure BID & record.  Follow up in two weeks with log. Monitor blood pressure at home; call if consistently over 130/80 Continue DASH diet.   Reminder to go to the ER if any CP, SOB, nausea, dizziness, severe HA, changes vision/speech, left arm numbness and tingling and jaw pain. -     CBC with Differential/Platelet -     COMPLETE METABOLIC PANEL WITH GFR -     TSH - continue medications, DASH diet, exercise and monitor at home. Call if greater than 130/80.   Hyperlipidemia, mixed -     Lipid panel check lipids decrease fatty foods increase activity.   Hypothyroidism, unspecified type Taking levothyroxine 112 mcg 1.5tablets three days a wek Tue, Thurs, Sat & 1tab four days a week. Reminder to take on an empty stomach 30-68mns before first meal of the day. No antacid medications for 4 hours. -     TSH  Abnormal glucose Discussed disease progression and risks Discussed diet/exercise, weight management and risk modification  Vitamin D deficiency -     VITAMIN D 25 Hydroxy (Vit-D Deficiency, Fractures)  Hyperparathyroidism (HCC) Monitor  Aortic atherosclerosis (HCC) Control blood pressure, cholesterol, glucose, increase exercise.   Rheumatoid arthritis involving multiple sites, unspecified whether rheumatoid factor present ([place] Continue follow up  Depression, major, single episode, in partial remission ([place] Doing well at this time, continue to monitor No medications:  Discussed stress management techniques  Discussed, increase water,intake & good sleep hygiene  Discussed increasing exercise & vegetables in diet  GERD Doing well at this time Continue: omeprazole '40mg'$  BID PRN Diet discussed Monitor for triggers Avoid food with high acid content Avoid excessive cafeine Increase  water intake  Medication management Continued   Subjective:    Patient ID: [name] female    DOB: [birth year] 76y.o.   MRN: [id number] .  76y.o. former smoking (Q96) WF with history of HTN, HLD,history of pre-diabetes (abnormal glucose), overweight, vitamin D def, RA on MTX and plaquenil and vitamin D deficiency presents for follow up.  Her blood pressure has been elevated  She has elevated blood pressure in office today.  Reports she does check at home and she has had some elevated readings in the evening 140's-150's over 80-90.  She is asymptomatic.  She is taking olmesartan '40mg'$  daily.  Of note she had a work up for syncopal episodes/dizziness in 2014 with negative holter that showed PVC/PAC with Dr. BMare Ferrari normal stress test 2016 and normal MRI in 2012. She was taken off her HCTZ at that time. She was recently sent to see neurology, had a normal MRI brain, normal EEG and EMG showed neuropathy. She did not have a MRI cspine.  Her last fall was Jan 2021.    Stress test 2016 Dr. CStanford BreedNuclear stress EF: 64%. The study is normal. This is a low risk study. The left ventricular ejection fraction is normal (55-65%). Never had CXR  Lab Results  Component Value Date   TSH 0.02 (L) 01/05/2022   Colonoscopy and EGD in 2014  She is on gabapentin '100mg'$  1 in AM and 2 at night, xanax, losartan '25mg'$  and off biotin x 1 month.  She sees Dr. HTrudie Reedregularly and states that her RA is well controlled on plaquenil and Methotrexate  She is on fosamax for osteoporosis,  stopped and restarted ago in fall 2018.   she has a diagnosis of insomnia and is currently on melatonin '20mg'$ , advil pm and takes 0.5 mg of xanax (has failed multiple agents in the past including trazodone), reports symptoms are well controlled on current regimen.  she has a diagnosis of GERD with esophagitis and hx of PUD/gastric ulcer for which she continues management by prilosec 40 mg. She will have soft  stools, like soft serve, will also have fecal incontinence. Has the neuropathy. Will suggest she follow up with ortho, will get Xray lumbar, and suggest getting on fiber supplement to bulk stool and to follow up with GI. Colonoscopy was 2015.   BMI is There is no height or weight on file to calculate BMI., she is working on diet and exercise. Wt Readings from Last 3 Encounters:  01/05/22 146 lb 3.2 oz (66.3 kg)  11/12/21 144 lb 3.2 oz (65.4 kg)  10/19/21 142 lb 9.6 oz (64.7 kg)   Her blood pressure is checked at home. Has been taking 50 mg losartan.   BP Readings from Last 3 Encounters:  01/06/22 137/82  01/05/22 122/68  11/12/21 130/68   She does not workout.  She denies chest pain, shortness of breath, dizziness.   She is not on cholesterol medication and denies myalgias. Her cholesterol is at goal. The cholesterol last visit was:   Lab Results  Component Value Date   CHOL 201 (H) 07/22/2021   HDL 83 07/22/2021   LDLCALC 103 (H) 07/22/2021   TRIG 64 07/22/2021   CHOLHDL 2.4 07/22/2021   Lab Results  Component Value Date   HGBA1C 4.8 07/22/2021   Patient is on Vitamin D supplement. Lab Results  Component Value Date   VD25OH 12 07/22/2021   She is on thyroid medication. Her medication was NOT changed last visit, 120mg, 1 pill daily.  Patient denies nervousness, palpitations and weight changes. She is on biotin.  Lab Results  Component Value Date   TSH 0.02 (L) 01/05/2022    There were no vitals taken for this visit.  Medications  Current Outpatient Medications (Endocrine & Metabolic):    alendronate (FOSAMAX) 70 MG tablet, TAKE 1 TABLET ONE TIME WEEKLY AS DIRECTED (SEE PACKAGE FOR ADDITIONAL INSTRUCTIONS)   levothyroxine (SYNTHROID) 150 MCG tablet, Take  1 tablet  Daily  on an empty stomach with only water for 30 minutes &  No Antacid meds, Calcium or Magnesium for 4 hours & avoid Biotin (Patient taking differently: Take  1/2 tablet on Monday and Thursday, and 1 tablet  on all other Days  on an empty stomach with only water for 30 minutes &  No Antacid meds, Calcium or Magnesium for 4 hours & avoid Biotin)  Current Outpatient Medications (Cardiovascular):    amLODipine (NORVASC) 2.5 MG tablet, Take 1 tablet (2.5 mg total) by mouth daily.   olmesartan (BENICAR) 20 MG tablet, TAKE 1 TABLET EVERY DAY FOR BLOOD PRESSURE   Current Outpatient Medications (Analgesics):    aspirin 81 MG tablet, Take 81 mg by mouth daily. 3 a week   ibuprofen (ADVIL) 800 MG tablet, Take 800 mg by mouth as needed.  Current Outpatient Medications (Hematological):    folic acid (FOLVITE) 1 MG tablet, Take 3 mg by mouth daily.  Current Outpatient Medications (Other):    Ascorbic Acid (VITAMIN C) 1000 MG tablet, Take 1,000 mg by mouth at bedtime.   Cholecalciferol (VITAMIN D PO), Take 5,000 Units by mouth See admin instructions. Takes 3 times  a week   gabapentin (NEURONTIN) 600 MG tablet, TAKE 1/2 TO 1 TABLET 2 TO 3 TIMES DAILY AS NEEDED FOR PAIN   hydroxychloroquine (PLAQUENIL) 200 MG tablet, Take 200 mg by mouth 2 (two) times daily.   magnesium gluconate (MAGONATE) 500 MG tablet, Take 500 mg by mouth at bedtime. 3 times a night   MELATONIN PO, Take 1 tablet by mouth at bedtime.   methotrexate 50 MG/2ML injection, Inject 25 mg into the skin once a week. Saturdays   omeprazole (PRILOSEC) 40 MG capsule, TAKE 1 CAPSULE TWICE DAILY FOR ACID REFLUX (Patient taking differently: Take 40 mg by mouth as needed.)   traZODone (DESYREL) 150 MG tablet, Take  1 tablet  1 hour  before Bedtime  as needed for Sleep   zinc gluconate 50 MG tablet, Take 50 mg by mouth daily. 3 times a week  Problem list She has Essential hypertension; Rheumatoid arthritis ([place]); Hyperlipidemia, mixed; Gastroesophageal reflux disease without esophagitis; Personal history of colonic polyps; Vitamin D deficiency; Abnormal glucose; Medication management; Hypothyroidism; Osteoporosis; S/P shoulder replacement, right;  Hyperparathyroidism ([place]); Overweight (BMI 25.0-29.9); Insomnia; Neuropathy; Aortic atherosclerosis ([place]) by CXR in 2021; Depression, major, single episode, in partial remission ([place]); COVID-19; Hyponatremia; COVID-19 virus infection; Staphylococcus epidermidis bacteremia; and Statin myopathy on their problem list.  Allergies Allergies  Allergen Reactions   Ace Inhibitors Cough   Ambien [Zolpidem] Other (See Comments)    Odd Feeling   Crestor [Rosuvastatin]    Leflunomide Other (See Comments)   Pravastatin    Prozac [Fluoxetine Hcl] Other (See Comments)    Decreased libido   Zoloft [Sertraline Hcl]     SURGICAL HISTORY She  has a past surgical history that includes Vaginal hysterectomy; Carpal tunnel release; Cystectomy; AV fistula repair; Bladder suspension; Pilonidal cyst excision; Trigger finger release; Bunionectomy; ERCP (09/29/2011); Tonsillectomy; Cholecystectomy (09/07/2011); Tenolysis (10/26/2011); Total shoulder replacement (Right, 02/22/2014); and Breast cyst excision (Left). FAMILY HISTORY Her family history includes Breast cancer in her paternal aunt; Colon cancer in her maternal grandfather; Heart attack in her brother and father; Lung cancer in her mother; Prostate cancer in her paternal grandfather; Stomach cancer in her father. SOCIAL HISTORY She  reports that she quit smoking about 26 years ago. Her smoking use included cigarettes. She has never used smokeless tobacco. She reports current alcohol use. She reports that she does not use drugs.  Review of Systems  Constitutional:  Negative for chills, diaphoresis, fever, malaise/fatigue and weight loss.  HENT:  Negative for congestion, ear discharge, ear pain, hearing loss, nosebleeds, sinus pain, sore throat and tinnitus.   Eyes:  Negative for blurred vision, double vision, photophobia, pain, discharge and redness.  Respiratory:  Negative for cough, hemoptysis, sputum production, shortness of breath, wheezing and stridor.    Cardiovascular:  Negative for chest pain, palpitations, orthopnea, claudication, leg swelling and PND.  Gastrointestinal:  Negative for abdominal pain, blood in stool, constipation, diarrhea, heartburn, melena, nausea and vomiting.  Genitourinary:  Negative for dysuria, flank pain, frequency, hematuria and urgency.  Musculoskeletal:  Positive for joint pain. Negative for back pain, falls, myalgias and neck pain.  Skin:  Negative for itching and rash.  Neurological:  Negative for dizziness, tingling, tremors, sensory change, speech change, focal weakness, seizures, loss of consciousness, weakness and headaches.  Endo/Heme/Allergies:  Negative for environmental allergies and polydipsia. Does not bruise/bleed easily.  Psychiatric/Behavioral:  Negative for depression, hallucinations, memory loss, substance abuse and suicidal ideas. The patient is not nervous/anxious and does not have insomnia.  Objective:   Physical Exam General appearance: alert, no distress, WD/WN,  female HEENT: normocephalic, sclerae anicteric, TMs pearly, nares patent, no discharge or erythema, pharynx normal Oral cavity: MMM, no lesions Neck: supple, no lymphadenopathy, no thyromegaly, no masses Heart: RRR, normal S1, S2, 1/6 systolic murmur RSB Lungs: CTA bilaterally, no wheezes, rhonchi, or rales Abdomen: +bs, soft, nontender, non distended, no masses, no hepatomegaly, no splenomegaly Musculoskeletal:  no swelling, no obvious deformity Extremities: no edema, no cyanosis, no clubbing Pulses: 2+ symmetric, upper and lower extremities, normal cap refill Neurological: alert, oriented x 3, CN2-12 intact, tremor right thumb at rest, strength normal upper extremities and lower extremities, good finger to nose, DTRs 2+ throughout, decreased sensation bilateral feet and hands, + romberg, gait normal Psychiatric: normal affect, behavior normal, pleasant  Breast: defer Gyn: defer Rectal: defer    Magda Bernheim,  NP 01/18/22

## 2022-01-19 ENCOUNTER — Other Ambulatory Visit: Payer: Self-pay

## 2022-01-19 ENCOUNTER — Encounter: Payer: Self-pay | Admitting: Nurse Practitioner

## 2022-01-19 ENCOUNTER — Ambulatory Visit (INDEPENDENT_AMBULATORY_CARE_PROVIDER_SITE_OTHER): Payer: Medicare PPO | Admitting: Nurse Practitioner

## 2022-01-19 VITALS — BP 142/78 | HR 60 | Temp 97.3°F | Wt 143.2 lb

## 2022-01-19 DIAGNOSIS — I10 Essential (primary) hypertension: Secondary | ICD-10-CM

## 2022-01-19 DIAGNOSIS — I700 Atherosclerosis of aorta: Secondary | ICD-10-CM

## 2022-01-19 DIAGNOSIS — E213 Hyperparathyroidism, unspecified: Secondary | ICD-10-CM | POA: Diagnosis not present

## 2022-01-19 DIAGNOSIS — E559 Vitamin D deficiency, unspecified: Secondary | ICD-10-CM

## 2022-01-19 DIAGNOSIS — E782 Mixed hyperlipidemia: Secondary | ICD-10-CM | POA: Diagnosis not present

## 2022-01-19 DIAGNOSIS — Z0001 Encounter for general adult medical examination with abnormal findings: Secondary | ICD-10-CM | POA: Diagnosis not present

## 2022-01-19 DIAGNOSIS — E039 Hypothyroidism, unspecified: Secondary | ICD-10-CM

## 2022-01-19 DIAGNOSIS — Z79899 Other long term (current) drug therapy: Secondary | ICD-10-CM | POA: Diagnosis not present

## 2022-01-19 DIAGNOSIS — M810 Age-related osteoporosis without current pathological fracture: Secondary | ICD-10-CM

## 2022-01-19 DIAGNOSIS — M069 Rheumatoid arthritis, unspecified: Secondary | ICD-10-CM | POA: Diagnosis not present

## 2022-01-19 DIAGNOSIS — R7309 Other abnormal glucose: Secondary | ICD-10-CM | POA: Diagnosis not present

## 2022-01-19 DIAGNOSIS — R6889 Other general symptoms and signs: Secondary | ICD-10-CM | POA: Diagnosis not present

## 2022-01-19 DIAGNOSIS — F324 Major depressive disorder, single episode, in partial remission: Secondary | ICD-10-CM | POA: Diagnosis not present

## 2022-01-19 DIAGNOSIS — M549 Dorsalgia, unspecified: Secondary | ICD-10-CM

## 2022-01-19 DIAGNOSIS — K219 Gastro-esophageal reflux disease without esophagitis: Secondary | ICD-10-CM

## 2022-01-19 DIAGNOSIS — Z0000 Encounter for general adult medical examination without abnormal findings: Secondary | ICD-10-CM

## 2022-01-19 MED ORDER — IBUPROFEN 800 MG PO TABS: 800.0000 mg | ORAL_TABLET | ORAL | 1 refills | Status: DC | PRN

## 2022-01-19 MED ORDER — BACLOFEN 10 MG PO TABS: 10.0000 mg | ORAL_TABLET | Freq: Two times a day (BID) | ORAL | 1 refills | Status: DC

## 2022-01-20 LAB — COMPLETE METABOLIC PANEL WITH GFR
AG Ratio: 2 (calc) (ref 1.0–2.5)
ALT: 17 U/L (ref 6–29)
AST: 28 U/L (ref 10–35)
Albumin: 4 g/dL (ref 3.6–5.1)
Alkaline phosphatase (APISO): 97 U/L (ref 37–153)
BUN: 7 mg/dL (ref 7–25)
CO2: 27 mmol/L (ref 20–32)
Calcium: 10.3 mg/dL (ref 8.6–10.4)
Chloride: 105 mmol/L (ref 98–110)
Creat: 0.85 mg/dL (ref 0.60–1.00)
Globulin: 2 g/dL (calc) (ref 1.9–3.7)
Glucose, Bld: 83 mg/dL (ref 65–99)
Potassium: 4.1 mmol/L (ref 3.5–5.3)
Sodium: 140 mmol/L (ref 135–146)
Total Bilirubin: 0.7 mg/dL (ref 0.2–1.2)
Total Protein: 6 g/dL — ABNORMAL LOW (ref 6.1–8.1)
eGFR: 72 mL/min/{1.73_m2} (ref >=60)

## 2022-01-20 LAB — CBC WITH DIFFERENTIAL/PLATELET
Absolute Monocytes: 380 cells/uL (ref 200–950)
Basophils Absolute: 39 cells/uL (ref 0–200)
Basophils Relative: 0.7 %
Eosinophils Absolute: 237 cells/uL (ref 15–500)
Eosinophils Relative: 4.3 %
HCT: 38.9 % (ref 35.0–45.0)
Hemoglobin: 13.1 g/dL (ref 11.7–15.5)
Lymphs Abs: 1337 cells/uL (ref 850–3900)
MCH: 31 pg (ref 27.0–33.0)
MCHC: 33.7 g/dL (ref 32.0–36.0)
MCV: 92 fL (ref 80.0–100.0)
MPV: 13.6 fL — ABNORMAL HIGH (ref 7.5–12.5)
Monocytes Relative: 6.9 %
Neutro Abs: 3509 cells/uL (ref 1500–7800)
Neutrophils Relative %: 63.8 %
Platelets: 135 10*3/uL — ABNORMAL LOW (ref 140–400)
RBC: 4.23 10*6/uL (ref 3.80–5.10)
RDW: 13.3 % (ref 11.0–15.0)
Total Lymphocyte: 24.3 %
WBC: 5.5 10*3/uL (ref 3.8–10.8)

## 2022-01-20 LAB — PTH, INTACT AND CALCIUM
Calcium: 10.3 mg/dL (ref 8.6–10.4)
PTH: 107 pg/mL — ABNORMAL HIGH (ref 16–77)

## 2022-01-20 LAB — LIPID PANEL
Cholesterol: 156 mg/dL (ref <200)
HDL: 68 mg/dL (ref >=50)
LDL Cholesterol (Calc): 73 mg/dL (calc)
Non-HDL Cholesterol (Calc): 88 mg/dL (calc) (ref <130)
Total CHOL/HDL Ratio: 2.3 (calc) (ref <5.0)
Triglycerides: 74 mg/dL (ref <150)

## 2022-01-20 LAB — TSH: TSH: 0.08 mIU/L — ABNORMAL LOW (ref 0.40–4.50)

## 2022-01-21 ENCOUNTER — Other Ambulatory Visit: Payer: Self-pay | Admitting: Nurse Practitioner

## 2022-01-21 MED ORDER — IBUPROFEN 800 MG PO TABS: ORAL_TABLET | ORAL | 1 refills | Status: DC

## 2022-01-27 ENCOUNTER — Ambulatory Visit: Payer: Medicare PPO | Admitting: Cardiology

## 2022-02-05 DIAGNOSIS — M810 Age-related osteoporosis without current pathological fracture: Secondary | ICD-10-CM | POA: Diagnosis not present

## 2022-02-05 DIAGNOSIS — E782 Mixed hyperlipidemia: Secondary | ICD-10-CM | POA: Diagnosis not present

## 2022-02-05 DIAGNOSIS — I10 Essential (primary) hypertension: Secondary | ICD-10-CM | POA: Diagnosis not present

## 2022-02-05 DIAGNOSIS — K219 Gastro-esophageal reflux disease without esophagitis: Secondary | ICD-10-CM | POA: Diagnosis not present

## 2022-02-10 DIAGNOSIS — D696 Thrombocytopenia, unspecified: Secondary | ICD-10-CM | POA: Diagnosis not present

## 2022-02-17 ENCOUNTER — Telehealth: Payer: Self-pay

## 2022-02-17 NOTE — Telephone Encounter (Signed)
LM-02/17/22-Called pt.and completed fall risk review call. ?Total time spent: 5 min. ? ?Fall Risk Review [facility]) ?Chart Review ?What recent interventions have been made by any provider to improve the patient's conditions in the last 3 months?: ?11/12/21-Jennifer Larkin Ina (Cardiology)- Pt. presented for HTN f/u. ?MODIFIED ? Amlodipine '5mg'$  QD to 2.'5mg'$  QD ?01/05/22-Dana Demetra Shiner, NP (PCP)- Pt. presented for extreme fatigue. No medication changes noted. ?01/19/22-Dana Demetra Shiner, NP- Pt. presented for Medicare AWV. ?STARTED ? Baclofen 10 mg Oral 2 times daily, Take 1/2 to 1 tablet 2 x day if needed for muscle spasm ?Any recent hospitalizations or ED visits since last visit with CPP?: ?No ?Adherence Review ?Adherence rates for STAR metric medications: ? Olmesartan '40mg'$  / 90DS / 12/08/20 & 02/14/21 ? Amlodipine 2.'5mg'$  / 90DS / 09/28/21 & 11/13/21 ?Adherence rates for medications indicated for disease state being reviewed: ?No medications indicated for disease state being reviewed. ?Does the patient have >5 day gap between last estimated fill dates for any of the above medications?: ?Yes ?Reasons for medication gaps: ?Switched pharmacy ? ?Disease State Questions ?Able to connect with the Patient?: ?Yes ?In the past 12 months, have you fallen?: ?No ?Are there any stairs in or around the home?: ?Yes ?Are there any stairs with handrails?: ?Yes ?Is the home free of loose throw rugs in walkways, pet beds, electrical cords, etc.?: ?Yes ?Is there adequate lighting in your home to reduce the risk of falls?: ?Yes ? ? ?

## 2022-03-03 ENCOUNTER — Other Ambulatory Visit: Payer: Self-pay | Admitting: Internal Medicine

## 2022-03-03 ENCOUNTER — Other Ambulatory Visit: Payer: Self-pay

## 2022-03-03 ENCOUNTER — Other Ambulatory Visit: Payer: Self-pay | Admitting: Adult Health

## 2022-03-03 DIAGNOSIS — E039 Hypothyroidism, unspecified: Secondary | ICD-10-CM

## 2022-03-03 DIAGNOSIS — G4700 Insomnia, unspecified: Secondary | ICD-10-CM

## 2022-03-03 DIAGNOSIS — I10 Essential (primary) hypertension: Secondary | ICD-10-CM

## 2022-03-03 MED ORDER — OLMESARTAN MEDOXOMIL 20 MG PO TABS: ORAL_TABLET | ORAL | 1 refills | Status: DC

## 2022-03-18 ENCOUNTER — Telehealth: Payer: Self-pay

## 2022-03-18 NOTE — Telephone Encounter (Signed)
LM-03/18/22-Chart review completed for anxiety/depression assessment. Calling pt. At [phone]  to complete PHQ-9 and GAD-7. Spoke w/ pt. But she stated now is not a good time. Pt. Requested I call her back later today. ? ?Total time spent: 10 min. ?

## 2022-03-24 NOTE — Telephone Encounter (Signed)
LM-03/24/22-Calling pt. To complete GAD-7 and PHQ-9 assessment.Spoke to pt. And she stated "now is not a good time." Informed pt. I could call her at another time that is convenient for her. Pt. Requested I call her back tomorrow morning. Will call pt. Back tomorrow morning to complete assessments. ? ?Total time spent: 3 min. ?

## 2022-03-25 NOTE — Telephone Encounter (Signed)
LM-03/25/22-Calling pt. To complete GAD-7 and PHQ-9 assessments.Spoke to pt. And completed GAD-7 and PHQ-9 assessments. Scores- GAD-7 (1) PHQ-9 (6). In pts. Opinion, her anxiety & depression symptoms have been stable over the last 3 months.  Anxiety and/or Depression Review ([facility]) Chart Review What recent interventions have been made by any provider to improve the patient's conditions in the last 3 months?: 01/05/22-Dana Mull, NP- Pt. presented for extreme fatigue. No medication changes noted. 01/19/22-Dana Mull, NP- Pt. presented for Medicare AWV. STARTED  Baclofen 10 mg Oral 2 times daily, Take 1/2 to 1 tablet 2 x day if needed for muscle spasm Any recent hospitalizations or ED visits since last visit with CPP?: No  Adherence Review Adherence rates for STAR metric medications:  Olmesartan '40mg'$  / 90DS / 12/08/20 & 03/04/21 90DS  Amlodipine 2.'5mg'$  / 90DS / 11/13/21 & 01/28/22 90DS Adherence rates for medications indicated for disease state being reviewed:  Trazodone '150mg'$  / 90DS / 03/05/22 & 03/08/22 Does the patient have >5 day gap between last estimated fill dates for any of the above medications?: No  Disease State Questions Able to connect with the Patient?: Yes Patient has (Multiselect): Anxiety, Depression Anxiety (GAD-7): Over the last 2 weeks, how often have you been bothered by the following problems?: Done Feeling nervous, anxious or on edge: 0 (Not at all) Not being able to stop or control worrying: 0 (Not at all) Worrying too much about different things: 0 (Not at all) Trouble relaxing: 0 (Not at all) Being so restless that it is hard to sit still: 0 (Not at all) Becoming easily annoyed or irritable: 1 (several days) Feeling afraid, as in something awful might happen: 0 (Not at all) Total GAD-7 Score: 1 Anxiety Severity: Minimal / none (Score: 0-4) Depression (PHQ-9): Over the last 2 weeks, how often have you been bothered by the following problems?: Done Little interest  or pleasure in doing things: 2 (More than half of the days) Feeling down, depressed, or hopeless: 0 (Not at all) Trouble falling or staying asleep, or sleeping too much: 2 (More than half of the days) Feeling tired or having little energy: 2 (More than half of the days) Poor appetite or overeating: 0 (Not at all) Feeling bad about yourself or that you are a failure or have let yourself or your family down: 0 (Not at all) Trouble concentrating on things, such as reading the newspaper or watching television: 0 (Not at all) Moving or speaking so slowly that other people could have noticed? Or the opposite - being so fidgety or restless that you have been moving around a to more than usual: 0 (Not at all) Thoughts that you would be better off dead or of hurting yourself in some way: 0 (Not at all) Total PHQ-9 Score: 6 Depression Severity: Mild Depression (Score: 5-9) In your opinion, how do you feel your anxiety / depression symptoms have been controlled over the past 3 months?: Stable / stayed the same Thank patient for answering questions. Your score shows "severity of anxiety / depression". If either score is 10 or above, I advised patient that I will pass this along to the clinical lead to review and discuss further with patient.: Done  Total time spent: 14 min.

## 2022-04-07 DIAGNOSIS — K219 Gastro-esophageal reflux disease without esophagitis: Secondary | ICD-10-CM | POA: Diagnosis not present

## 2022-04-07 DIAGNOSIS — E782 Mixed hyperlipidemia: Secondary | ICD-10-CM | POA: Diagnosis not present

## 2022-04-07 DIAGNOSIS — M810 Age-related osteoporosis without current pathological fracture: Secondary | ICD-10-CM | POA: Diagnosis not present

## 2022-04-07 DIAGNOSIS — I10 Essential (primary) hypertension: Secondary | ICD-10-CM | POA: Diagnosis not present

## 2022-04-09 ENCOUNTER — Telehealth: Payer: Self-pay

## 2022-04-09 NOTE — Telephone Encounter (Signed)
LM-04/09/22-Calling pt. To confirm upcoming CP visit and complete precall questions. Unable to reach pt. [place].  Total time spent: 2 min.

## 2022-04-12 NOTE — Telephone Encounter (Signed)
LM-04/12/22-Called and spoke to pt. Confirmed CP visit for 6/6 at 10:00AM *OV*.  Total time spent: 8 min.

## 2022-04-13 ENCOUNTER — Other Ambulatory Visit: Payer: Self-pay | Admitting: Internal Medicine

## 2022-04-13 ENCOUNTER — Encounter: Payer: Self-pay | Admitting: Internal Medicine

## 2022-04-13 ENCOUNTER — Ambulatory Visit: Payer: Medicare PPO | Admitting: Pharmacy Technician

## 2022-04-13 DIAGNOSIS — E039 Hypothyroidism, unspecified: Secondary | ICD-10-CM

## 2022-04-13 DIAGNOSIS — R001 Bradycardia, unspecified: Secondary | ICD-10-CM

## 2022-04-13 DIAGNOSIS — I951 Orthostatic hypotension: Secondary | ICD-10-CM

## 2022-04-13 DIAGNOSIS — Z79899 Other long term (current) drug therapy: Secondary | ICD-10-CM | POA: Diagnosis not present

## 2022-04-13 DIAGNOSIS — E538 Deficiency of other specified B group vitamins: Secondary | ICD-10-CM

## 2022-04-13 DIAGNOSIS — R0989 Other specified symptoms and signs involving the circulatory and respiratory systems: Secondary | ICD-10-CM

## 2022-04-13 DIAGNOSIS — R5383 Other fatigue: Secondary | ICD-10-CM | POA: Diagnosis not present

## 2022-04-13 DIAGNOSIS — T466X5A Adverse effect of antihyperlipidemic and antiarteriosclerotic drugs, initial encounter: Secondary | ICD-10-CM

## 2022-04-13 DIAGNOSIS — E663 Overweight: Secondary | ICD-10-CM

## 2022-04-13 DIAGNOSIS — G720 Drug-induced myopathy: Secondary | ICD-10-CM

## 2022-04-13 DIAGNOSIS — E213 Hyperparathyroidism, unspecified: Secondary | ICD-10-CM

## 2022-04-13 DIAGNOSIS — F5101 Primary insomnia: Secondary | ICD-10-CM

## 2022-04-13 DIAGNOSIS — E782 Mixed hyperlipidemia: Secondary | ICD-10-CM

## 2022-04-13 DIAGNOSIS — I10 Essential (primary) hypertension: Secondary | ICD-10-CM

## 2022-04-14 ENCOUNTER — Other Ambulatory Visit: Payer: Self-pay | Admitting: Internal Medicine

## 2022-04-14 DIAGNOSIS — Z79899 Other long term (current) drug therapy: Secondary | ICD-10-CM | POA: Diagnosis not present

## 2022-04-14 DIAGNOSIS — Z6825 Body mass index (BMI) 25.0-25.9, adult: Secondary | ICD-10-CM | POA: Diagnosis not present

## 2022-04-14 DIAGNOSIS — E039 Hypothyroidism, unspecified: Secondary | ICD-10-CM

## 2022-04-14 DIAGNOSIS — M1991 Primary osteoarthritis, unspecified site: Secondary | ICD-10-CM | POA: Diagnosis not present

## 2022-04-14 DIAGNOSIS — M0609 Rheumatoid arthritis without rheumatoid factor, multiple sites: Secondary | ICD-10-CM | POA: Diagnosis not present

## 2022-04-14 DIAGNOSIS — E663 Overweight: Secondary | ICD-10-CM | POA: Diagnosis not present

## 2022-04-14 LAB — COMPLETE METABOLIC PANEL WITH GFR
AG Ratio: 2.2 (calc) (ref 1.0–2.5)
ALT: 15 U/L (ref 6–29)
AST: 27 U/L (ref 10–35)
Albumin: 4.1 g/dL (ref 3.6–5.1)
Alkaline phosphatase (APISO): 112 U/L (ref 37–153)
BUN: 8 mg/dL (ref 7–25)
CO2: 31 mmol/L (ref 20–32)
Calcium: 10.8 mg/dL — ABNORMAL HIGH (ref 8.6–10.4)
Chloride: 104 mmol/L (ref 98–110)
Creat: 0.94 mg/dL (ref 0.60–1.00)
Globulin: 1.9 g/dL (calc) (ref 1.9–3.7)
Glucose, Bld: 76 mg/dL (ref 65–99)
Potassium: 4 mmol/L (ref 3.5–5.3)
Sodium: 141 mmol/L (ref 135–146)
Total Bilirubin: 0.6 mg/dL (ref 0.2–1.2)
Total Protein: 6 g/dL — ABNORMAL LOW (ref 6.1–8.1)
eGFR: 64 mL/min/{1.73_m2} (ref >=60)

## 2022-04-14 LAB — VITAMIN B12: Vitamin B-12: 351 pg/mL (ref 200–1100)

## 2022-04-14 LAB — PTH, INTACT AND CALCIUM
Calcium: 10.8 mg/dL — ABNORMAL HIGH (ref 8.6–10.4)
PTH: 97 pg/mL — ABNORMAL HIGH (ref 16–77)

## 2022-04-14 LAB — TSH: TSH: 0.04 mIU/L — ABNORMAL LOW (ref 0.40–4.50)

## 2022-04-14 MED ORDER — LEVOTHYROXINE SODIUM 75 MCG PO TABS: ORAL_TABLET | ORAL | 1 refills | Status: DC

## 2022-04-14 NOTE — Progress Notes (Signed)
<><><><><><><><><><><><><><><><><><><><><><><><><><><><><><><><><> <><><><><><><><><><><><><><><><><><><><><><><><><><><><><><><><><> -   Test results slightly outside the reference range are not unusual. If there is anything important, I will review this with you,  otherwise it is considered normal test values.  If you have further questions,  please do not hesitate to contact me at the office or via My Chart.  <><><><><><><><><><><><><><><><><><><><><><><><><><><><><><><><><> <><><><><><><><><><><><><><><><><><><><><><><><><><><><><><><><><>  -   -  Vitamin B12 =    351   Very Low  (Ideal or Goal Vit B12 is between 450 - 1,100)   Low Vit B12 may be associated with Anemia , Fatigue,   Peripheral Neuropathy, Dementia, "Brain Fog", & Depression  - Recommend take a sub-lingual form of Vitamin B12 tablet ONLY 3 x /week  ! ! !  1,000 to 5,000 mcg tab that you dissolve under your tongue /Daily   - Can get Baron Sane - best price at [employer] or on [employer] <><><><><><><><><><><><><><><><><><><><><><><><><><><><><><><><><>  -  TSH is very  very Low which means                                          Thyroid hormone level is way too high in Blood.   - Currently taking # 6 tabs 112 mcg  levothyroxine / week , So . . . . . Marland Kitchen   - Sent in new Rx for Levothyroxine 75 mcg to take 1 tablet daily - 7 days / week   - Recommend schedule a Nurse Visit in 1 month to recheck  TSH level   - bill mck

## 2022-04-16 NOTE — Progress Notes (Signed)
Follow Up Pharmacist Visit  YARENI, CREPS [id number]  10 years, Female  DOB: October 06, 1946  M: [id number] Care Team: Ander Gaster, Lenda Kelp, [place], [place], [place]  [place], [place]  [place], Owens Shark  Kandis Nab Assigned to: Rosary Lively  Assigned On: 04/09/2022 __________________________________________________ Patient's Chronic Conditions: Hypertension (HTN), Gastroesophageal Reflux Disease (GERD), Hypothyroidism, Osteopenia or Osteoporosis, Hyperlipidemia/Dyslipidemia (HLD), Insomnia, Depression, Diabetes (DM), Vitamin D deficiency , Prediabetes, Hyperparathyroidism, Neuropathy, Rheumatoid arthritis, Statin myopathy, Overweight, Hyponatremia   Summary for PCP:  1. Labile BP with bradycardia noted. EKG performed in office today by PCP. Faxed to Cardiology office for review. 2. Mention of fainting spells, dizziness frequently. Has spell of hypotension from sitting to standing today during visit which she described as "looking down a tunnel". EKG in office today.  3. Check TSH, PTH, CMP and B12 today. 4. Patient stopped Baclofen and Alendronate (drug holiday). 5. Patient due for DEXA and prefers a Cologuard test. 6. 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  Doctor and Hospital Visits Were there PCP Visits since last visit with the Pharmacist?: Yes Visit #1: 01/19/22-Dana Demetra Shiner, NP- Pt. presented for AWV and 3 month f/u. STARTED: Baclofen 10 mg Oral 2 times daily, Take 1/2 to 1 tablet 2 x day if needed for muscle spasm Were there Specialist Visits since last visit with the Pharmacist?: No Was there a Hospital Visit in last 30 days?: No Were there other Hospital Visits since last visit with the Pharmacist?: No  Medication Information Have there been any medication changes from PCP or Specialist since last visit with the Pharmacist?: No Are there any Medication adherence gaps (beyond 5 days past due)?: Yes Details:  Levothyroxine  135mg- 09/17/21 & 12/15/21 (>5 days gap) 90DS [place] '70mg'$ - 10/05/21 & 12/17/21 (>5 days gap) 84DS By [place] Medication adherence rates for the STAR rating drugs:  Olmesartan '20mg'$ - 03/04/22 90DS Amlodipine 2.'5mg'$ - 01/28/22 90DS  List Patient's current Care Gaps: Need Colorectal Cancer Screening  HC chart prep: 30 min. [facility]  Care Gaps Colonoscopy COVID-19 Vaccine Zoster Vaccines  Pre-Call Questions Are you able to connect with Patient: Yes Confirmed appointment date/time with patient/caregiver?: Yes Date/time of the appointment: 6/6 at 10:00AM Visit type: Clinic Patient/Caregiver instructed to bring medications to appointment: Yes What, if any, problems do you have getting your medications from the pharmacy?: Access barriers What is your top health concern to discuss at your upcoming visit?:  Pt. stated she has problems getting a medication [facility] Asked pt. what medications she is having trouble w/, pt. replied she was unsure what the name of the medication was. Pt. was extremely aggravated and stated "no one cares". Assured pt. we care and explained to pt. that CP will address pharmacy/medication issues during the visit.  Pt. stated she called both Dr. MMelford Aaseand CD[place]w/ no response. Apologized to pt. for the inconvenience and assured pt. her medication concerns will be addressed during her appointment. Pt. verbalized understanding.  Have you seen any other providers since your last visit?: No  Subjective Information Current BP: 142/78 Current HR: 60 taken on: 01/19/2022 Weight: 143 BMI: 27.06 Last GFR: 72 taken on: 01/19/2022 Visit Completed on: 04/13/2022 Why did the patient present?: CCM Follow up Lifestyle habits such as diet and exercise?: Snacks more than eating regular meals. No formal activity or exercise. What is the patient's sleep pattern?: No sleep issues How many hours per night  does  patient typically sleep?: 8+ Factors that may affect medication adherence?: Pill burden Is Patient using [facility] pharmacy?: No Name and location of Current pharmacy: [facility] Current Rx insurance plan: [employer] Are meds delivered by current pharmacy?: Yes - by mail order pharmacy Would patient benefit from direct intervention of clinical lead in dispensing process to optimize clinical outcomes?: No Are [facility] pharmacy services available where patient lives?: Yes Is patient disadvantaged to use [facility]?: Yes Does patient experience delays in picking up medications due to transportation concerns (getting to pharmacy)?: No Any additional demeanor/mood notes?: Pleasant lady presenting in office today for CCM follow up visit. No concerns other than delayed refill from [facility] for Trazodone. She did complain of frequent sniffles and feeling of nose running. No other symptoms present. During visit she also asked for discharge summary from [facility] stay back in July 2022. She utilizes a pill box for medication organization and states compliance to regimen. No copay barriers but she states that she "hates" taking all of these pills.  SDOH: Accountable Health Communities Health-Related Social Needs Screening Tool (BloggerBowl.es) SDOH questions were documented and reviewed (EMR or [employer]) within the past 3 months?: No What is your living situation today? (ref #1): I have a steady place to live Think about the place you live. Do you have problems with any of the following? (ref #2): None of the above Within the past 12 months, you worried that your food would run out before you got money to buy more (ref #3): Never true Within the past 12 months, the food you bought just didn't last and you didn't have money to get more (ref #4): Never true In the past 12 months, has lack of reliable  transportation kept you from medical appointments, meetings, work or from getting things needed for daily living? (ref #5): No In the past 12 months, has the electric, gas, oil, or water company threatened to shut off services in your home? (ref #6): No  Hypertension (HTN) Discussed with patient today?: Yes Is patient able to obtain BP reading today?: Yes BP today is: 143/74 Heart Rate is: 47 Goal: <140/90 mmHG Hypertension Stage: Stage 2 (SBP >140 or DBP > 90) Is Patient checking BP at home?: Yes Patient home BP readings are ranging: 130s-140s/70s How often does patient miss taking their blood pressure medications?: None per patient Has patient experienced hypotension, dizziness, falls or bradycardia?: Yes Provide Details: Hypotension from sitting to standing, dizzy spells, bradycardia, fainting spells Check present secondary causes (below) for HTN: Obesity, CKD Does Patient use RPM device?: No BP RPM device: Does patient qualify?: No We discussed: DASH diet:  following a diet emphasizing fruits and vegetables and low-fat dairy products along with whole grains, fish, poultry, and nuts. Reducing red meats and sugars., Getting enough potassium in your diet equaling 3500-'5000mg'$ /day.  This helps to regulate BP by balancing out the effects of salt., Weight reduction- We discussed losing 5-10% of body weight., Proper Home BP Measurement, Hypertension pathophysiology, complications, treatment goals, and management with patient for 10-15 minutes, Increasing movement, Increasing exercise (walking, biking, swimming) to a goal of 30 minutes per day, as able based on current activity level and health or as directed by your healthcare provider., Contacting PCP office for signs and symptoms of high or low blood pressure (hypotension, dizziness, falls, headaches, edema) Assessment:: Uncontrolled Drug: Amlodipine 2.'5mg'$  daily Assessment: Appropriate, Query Effectiveness Drug: Olmesartan '20mg'$  daily Assessment:  Appropriate, Query Effectiveness Additional Info: Patient  experiencing bradycardia in office today along with a hypotension episode from sitting to standing explained as "Right now I am having a spell that feels like I am looking down a tunnel." She states she drink 1 gallon of tea per day. Plan to Start: Documenting home BP and times of dizziness/fainting Plan to Order: EKG in office today Plan to Review: CMP, Mg, TSH, EKG Plan to Counsel: Counseled on adequate fluid intake to alleviate dehydration. Patient agreed to drink one glass of water for each glass of tea.  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 Increasing soluble fiber intake.  Avoiding sugary foods and trans fat, limiting carbohydrates, and reducing portion sizes. Recommended increasing intake of healthy fats into their diet,  Weight reduction- We discussed losing 5-10% of body weight Assessment:: Uncontrolled Drug: None Assessment: Query Appropriateness Plan to Review: Lipid Panel Plan to Counsel: Counseled on lifestyle and dietary modification needed Pharmacist Follow up: 07/13/22 office visit  Diabetes (DM) Current A1C: 4.8 taken on: 07/22/2021 Type: Pre-Diabetes/Impaired Fasting Glucose Discussed with patient today?: No Drug: None Assessment: Query Appropriateness  Depression Previous PHQ-9 Score: 6 Discussed with patient today?: No  GERD Assessment Discussed with patient today?: No Drug: Omeprazole '40mg'$  BID Assessment: Appropriate, Effective, Query Safety  Osteopenia or Osteoporosis Current T-score: -4.3 taken on: 06/08/2019 Current Vitamin D 25-OH: 88 taken on: 07/22/2021 Discussed with patient today?: No  Hypothyroidism Current TSH: 0.08 taken on: 01/19/2022 Current T4: 1.4 taken on: 12/16/2016 Current T3: 2.5 taken on: 12/16/2016 Discussed with patient today?: Yes Patient has experienced the following symptoms: Hair loss, Fatigue, Bradycardia, Hyperlipidemia, Depression, Weight gain We discussed: Proper administration of levothyroxine (empty stomach, 30 min before breakfast and with no other meds / vitamins), Monitoring for signs / symptoms of hypothyroidism (weight gain, fatigue, hair loss, cold intolerance) Assessment:: Uncontrolled Drug: Levothyroxine 193mg 1/2 tablet Monday and Thursday, 1 tablet all other days Assessment: Appropriate, Query Effectiveness Additional Info: PTH 107 Plan to Order: TSH, PTH Plan to Review: Labs when resulted Plan to Counsel: Counseled on appropriate admin of Levothyroxine and daily BP monitoring Plan to (other): Call patient when labs result Pharmacist Follow up: Call when labs result. Follow up 07/13/22 in office  Insomnia Discussed with patient today?: No  General Disease Assessment Discussed with patient today?: No  Tobacco Use  Disorder Discussed with patient today?: No  Exercise, Diet and Non-Drug Coordination Needs Additional exercise counseling points. We discussed: aiming to lose 5 to 10% of body weight through lifestyle modifications, incorporating flexibility, balance, and strength training exercises, decreasing sedentary  behavior, targeting at least 151 minutes per week of moderate-intensity aerobic exercise. Additional diet counseling points. We discussed: key components of the DASH diet, key components of a low-carb eating plan, aiming to consume at least 8 cups of water day, limiting caffeine intake Discussed Non-Drug Care Coordination Needs: Yes Does Patient have Medication financial barriers?: No  Engagement Notes CPP Prep: 10mn CPP OV: 779m CPP Doc: 30 min  Clinical Summary Patient Risk: Moderate Next CCM Follow Up: 07/13/22 Next AWV: 04/22/23 Next PCP Visit: 07/22/22   AvMarda StalkerPharmD Clinical Pharmacist AvNaida [email or web address] .care ([phone]

## 2022-05-07 DIAGNOSIS — E213 Hyperparathyroidism, unspecified: Secondary | ICD-10-CM | POA: Diagnosis not present

## 2022-05-07 DIAGNOSIS — E039 Hypothyroidism, unspecified: Secondary | ICD-10-CM | POA: Diagnosis not present

## 2022-05-07 DIAGNOSIS — I10 Essential (primary) hypertension: Secondary | ICD-10-CM | POA: Diagnosis not present

## 2022-05-07 DIAGNOSIS — E782 Mixed hyperlipidemia: Secondary | ICD-10-CM | POA: Diagnosis not present

## 2022-05-17 ENCOUNTER — Ambulatory Visit (INDEPENDENT_AMBULATORY_CARE_PROVIDER_SITE_OTHER): Payer: Medicare PPO

## 2022-05-17 DIAGNOSIS — E039 Hypothyroidism, unspecified: Secondary | ICD-10-CM

## 2022-05-17 NOTE — Progress Notes (Signed)
The patient returns today for repeat TSH. She reports that she takes her thyroid medication every morning and waits 30 minutes before eating or drinking her morning coffee. She also reports that she is not taking any biotin or biotin containing medications at this time.

## 2022-05-18 LAB — TSH: TSH: 3.1 mIU/L (ref 0.40–4.50)

## 2022-05-18 NOTE — Progress Notes (Signed)
<><><><><><><><><><><><><><><><><><><><><><><><><><><><><><><><><> <><><><><><><><><><><><><><><><><><><><><><><><><><><><><><><><><> -   Test results slightly outside the reference range are not unusual. If there is anything important, I will review this with you,  otherwise it is considered normal test values.  If you have further questions,  please do not hesitate to contact me at the office or via My Chart.  <><><><><><><><><><><><><><><><><><><><><><><><><><><><><><><><><> <><><><><><><><><><><><><><><><><><><><><><><><><><><><><><><><><>  -  TSH / Thyroid - Perfect Now  (Finally)    - Please keep dose same  &                                        will continue to monitor closely at subsequent Office Visits   <><><><><><><><><><><><><><><><><><><><><><><><><><><><><><><><><> <><><><><><><><><><><><><><><><><><><><><><><><><><><><><><><><><>

## 2022-05-25 DIAGNOSIS — R42 Dizziness and giddiness: Secondary | ICD-10-CM | POA: Diagnosis not present

## 2022-05-25 DIAGNOSIS — H903 Sensorineural hearing loss, bilateral: Secondary | ICD-10-CM | POA: Diagnosis not present

## 2022-06-09 ENCOUNTER — Other Ambulatory Visit: Payer: Self-pay | Admitting: *Deleted

## 2022-06-09 NOTE — Patient Outreach (Signed)
  Care Coordination   06/09/2022 Name: [name] MRN: [id number] DOB: 1946-03-24   Care Coordination Outreach Attempts:  Contact was made with the patient today to offer care coordination services as a benefit of their health plan. Patient states she currently has a scheduled CCM appointment with [name] on 07/13/22 and does not need any further services.  Follow Up Plan:   No further outreach attempts will be made at this time.   Encounter Outcome:  Pt. Refused  Care Coordination Interventions Activated:  No   Care Coordination Interventions:  No, not indicated    Emelia Loron RN, BSN [place] [phone] [email or web address]

## 2022-07-08 ENCOUNTER — Telehealth: Payer: Self-pay

## 2022-07-08 NOTE — Telephone Encounter (Signed)
LM-07/08/22-Called pt. To confirm CP focused outreach for 9/5. Unable to reach pt. Esch[place] X1.  Total time spent: 2 min.

## 2022-07-08 NOTE — Telephone Encounter (Signed)
LM-07/08/22- Pt. Returned call and confirmed CP focused outreach for 9/5 at 3:30PM. Precall questions complete.  Total time spent: 10 min.

## 2022-07-13 ENCOUNTER — Ambulatory Visit: Payer: Medicare PPO | Admitting: Pharmacy Technician

## 2022-07-13 ENCOUNTER — Telehealth: Payer: Self-pay

## 2022-07-13 NOTE — Progress Notes (Signed)
BP: Checking BP daily. No highs or lows noted. Ranges in 120s-130s/70s. Did have bradycardia at last CCM visit. Still has dizziness going from sitting to standing positions. Admits to drinking tea mostly and little water. Counseled to increase water intake to 64oz daily. No falls noted. Had visit with ENT doctor and they could not find anything.    Patient is due for a DEXA and Cologuard test. PCP please review at AWV.   No constipation noted.     CPP Televisit: 23mn  AMarda Stalker PharmD Clinical Pharmacist ANaida [email or web address] ([phone]

## 2022-07-13 NOTE — Telephone Encounter (Signed)
07/13/22-Reviewed pts. Chart for care gaps and medication adherence for focused f/u with CP scheduled for 9/5.  Total time spent:5 min.

## 2022-07-14 DIAGNOSIS — M0609 Rheumatoid arthritis without rheumatoid factor, multiple sites: Secondary | ICD-10-CM | POA: Diagnosis not present

## 2022-07-21 ENCOUNTER — Encounter: Payer: Self-pay | Admitting: Internal Medicine

## 2022-07-21 NOTE — Progress Notes (Unsigned)
Annual Screening/Preventative Visit & Comprehensive Evaluation &  Examination  Future Appointments  Date Time Provider Department  07/22/2022              cpe 10:00 AM [name], MD [facility]  04/22/2023             wellness 10:00 AM Alycia Rossetti, NP [facility]  07/25/2023              cpe 10:00 AM [name], MD [facility]         This very nice 76 y.o. MWF presents for a Screening /Preventative Visit & comprehensive evaluation and management of multiple medical co-morbidities.  Patient has been followed for HTN, HLD, Rheumatoid Arthritis, Prediabetes , Hypothyroidism. and Vitamin D Deficiency. Patient  is followed by Dr Berna Bue for Rheumatoid Arthritis (1985) and is on MTX /Plaquenil . CXR in 2021 showed Aortic Atherosclerosis. Her GERD is controlled on her meds.         HTN predates since 1997. Patient's BP has been controlled at home and patient denies any cardiac symptoms as chest pain, palpitations, shortness of breath or ankle swelling.  Today's BP is at goal - 130/70 .        Patient's hyperlipidemia is controlled with diet and medications. Patient denies myalgias or other medication SE's. Last lipids were at goal:  Lab Results  Component Value Date   CHOL 156 01/19/2022   HDL 68 01/19/2022   LDLCALC 73 01/19/2022   TRIG 74 01/19/2022   CHOLHDL 2.3 01/19/2022                                              Patient was diagnosed Hypothyroid  and has been on thyroid replacement since the 1980's.       Patient has hx/o prediabetes (A1c 5.8% /2011) and patient denies reactive hypoglycemic symptoms, visual blurring, diabetic polys or paresthesias. Last A1c was normal & at goal:  Lab Results  Component Value Date   HGBA1C 4.8 07/22/2021         Finally, patient has history of Vitamin D Deficiency  ("38" /2008) and last Vitamin D was at goal:  Lab Results  Component Value Date   VD25OH 88 07/22/2021       Current Outpatient Medications:     Ascorbic Acid (VITAMIN C) 1000 MG tablet, Take 1,000 mg by mouth at bedtime., Disp: , Rfl:    aspirin 81 MG tablet, Take 81 mg by mouth daily. 3 a week, Disp: , Rfl:    Cholecalciferol (VITAMIN D PO), Take 5,000 Units by mouth See admin instructions. Takes 3 times a week, Disp: , Rfl:    folic acid (FOLVITE) 1 MG tablet, Take 3 mg by mouth daily., Disp: , Rfl:    gabapentin (NEURONTIN) 600 MG tablet, TAKE 1/2 TO 1 TABLET 2 TO 3 TIMES DAILY AS NEEDED FOR PAIN, Disp: 270 tablet, Rfl: 1   hydroxychloroquine (PLAQUENIL) 200 MG tablet, Take 200 mg by mouth 2 (two) times daily., Disp: , Rfl:    ibuprofen (ADVIL) 800 MG tablet, 1 tab once a day as needed for moderate to severe pain, Disp: 90 tablet, Rfl: 1   levothyroxine (SYNTHROID) 75 MCG tablet, Take  1 tablet  Daily  on an empty stomach with only water for 30 minutes & no Antacid meds, Calcium or Magnesium for 4 hours &  avoid Biotin, Disp: 90 tablet, Rfl: 1   magnesium gluconate (MAGONATE) 500 MG tablet, Take 500 mg by mouth at bedtime. 3 times a night, Disp: , Rfl:    methotrexate 50 MG/2ML injection, Inject 25 mg into the skin once a week. Saturdays, Disp: , Rfl:    olmesartan (BENICAR) 20 MG tablet, TAKE 1 TABLET EVERY DAY FOR BLOOD PRESSURE, Disp: 90 tablet, Rfl: 1   omeprazole (PRILOSEC) 40 MG capsule, TAKE 1 CAPSULE TWICE DAILY FOR ACID REFLUX (Patient taking differently: Take 40 mg by mouth as needed.), Disp: 180 capsule, Rfl: 3   traZODone (DESYREL) 150 MG tablet, TAKE 1 TABLET 1 HOUR BEFORE BEDTIME AS NEEDED FOR SLEEP, Disp: 90 tablet, Rfl: 3   zinc gluconate 50 MG tablet, Take 50 mg by mouth daily. 3 times a week, Disp: , Rfl:    amLODipine (NORVASC) 2.5 MG tablet, Take 1 tablet (2.5 mg total) by mouth daily., Disp: 180 tablet, Rfl: 3    Allergies  Allergen Reactions   Ace Inhibitors Cough   Ambien [Zolpidem] Other (See Comments)    Odd Feeling   Crestor [Rosuvastatin]    Pravastatin    Prozac [Fluoxetine Hcl] Other (See Comments)     Decreased libido   Zoloft [Sertraline Hcl]      Past Medical History:  Diagnosis Date   Anemia    Hx of    Arthritis    Calculus of bile duct without mention of cholecystitis or obstruction    Cataract    GERD (gastroesophageal reflux disease)    H. pylori infection    Hx of    Heart murmur    Hyperlipidemia    Hypertension    Hypothyroid    PUD (peptic ulcer disease)    Rheumatoid arthritis(714.0)      Health Maintenance  Topic Date Due   COVID-19 Vaccine (1) Never done   Zoster Vaccines- Shingrix (1 of 2) Never done   COLONOSCOPY (Pts 45-39yr Insurance coverage will need to be confirmed)  03/08/2018   INFLUENZA VACCINE  06/08/2021   MAMMOGRAM  07/30/2022   TETANUS/TDAP  11/08/2022   DEXA SCAN  Completed   Hepatitis C Screening  Completed   PNA vac Low Risk Adult  Completed   HPV VACCINES  Aged Out     Immunization History  Administered Date(s) Administered   Influenza, High Dose Seasonal PF 07/16/2014, 09/17/2015, 07/01/2016, 08/04/2017, 08/25/2018   Pneumococcal Conjugate-13 10/16/2015   Pneumococcal Polysaccharide-23 11/08/2012   Td 11/08/2012   Zoster, Live 09/17/2015     Last Colon - 03/08/2013 - Dr KDeatra Ina- recc 10 yr f/u - due May 2024  Last MGM - 09/16/2021   Past Surgical History:  Procedure Laterality Date   AV FISTULA REPAIR     BLADDER SUSPENSION     BREAST CYST EXCISION Left    BUNIONECTOMY     CARPAL TUNNEL RELEASE     CHOLECYSTECTOMY  09/07/2011   CYSTECTOMY     left breast   ERCP  09/29/2011   Procedure: ENDOSCOPIC RETROGRADE CHOLANGIOPANCREATOGRAPHY (ERCP);  Surgeon: RInda Castle MD;  [facility];  Service: Endoscopy;  Laterality: N/A;   PILONIDAL CYST EXCISION     TENOLYSIS  10/26/2011   Procedure: TENDON SHEATH RELEASE/TENOLYSIS;  Surgeon: GWynonia Sours MD;  [facility]  Service: Orthopedics;  Laterality: Right;  tenosynovectomy removal superficialis slip right index finger    TONSILLECTOMY     TOTAL SHOULDER REPLACEMENT Right 02/22/2014   Dr.  Almekinders at [place]     ovaries not removed     Family History  Problem Relation Age of Onset   Lung cancer Mother    Stomach cancer Father    Heart attack Father        MI at age 53   Colon cancer Maternal Grandfather    Heart attack Brother        MI at age 92   Prostate cancer Paternal Grandfather    Breast cancer Paternal Aunt      Social History   Tobacco Use   Smoking status: Former    Types: Cigarettes    Quit date: 10/25/1995    Years since quitting: 25.7   Smokeless tobacco: Never   Tobacco comments:    Maryann Conners [id number]  Substance Use Topics   Alcohol use: Yes    Alcohol/week: 0.0 standard drinks    Comment: occasional   Drug use: No      ROS Constitutional: Denies fever, chills, weight loss/gain, headaches, insomnia,  night sweats, and change in appetite. Does c/o fatigue. Eyes: Denies redness, blurred vision, diplopia, discharge, itchy, watery eyes.  ENT: Denies discharge, congestion, post nasal drip, epistaxis, sore throat, earache, hearing loss, dental pain, Tinnitus, Vertigo, Sinus pain, snoring.  Cardio: Denies chest pain, palpitations, irregular heartbeat, syncope, dyspnea, diaphoresis, orthopnea, PND, claudication, edema Respiratory: denies cough, dyspnea, DOE, pleurisy, hoarseness, laryngitis, wheezing.  Gastrointestinal: Denies dysphagia, heartburn, reflux, water brash, pain, cramps, nausea, vomiting, bloating, diarrhea, constipation, hematemesis, melena, hematochezia, jaundice, hemorrhoids Genitourinary: Denies dysuria, frequency, urgency, nocturia, hesitancy, discharge, hematuria, flank pain Breast: Breast lumps, nipple discharge, bleeding.  Musculoskeletal: Denies arthralgia, myalgia, stiffness, Jt. Swelling, pain, limp, and strain/sprain. Denies falls. Skin: Denies puritis, rash, hives, warts, acne, eczema, changing in skin  lesion Neuro: No weakness, tremor, incoordination, spasms, paresthesia, pain Psychiatric: Denies confusion, memory loss, sensory loss. Denies Depression. Endocrine: Denies change in weight, skin, hair change, nocturia, and paresthesia, diabetic polys, visual blurring, hyper / hypo glycemic episodes.  Heme/Lymph: No excessive bleeding, bruising, enlarged lymph nodes.  Physical Exam  BP 130/70   Pulse 66   Temp (!) 97.4 F (36.3 C)   Resp 17   Ht '5\' 1"'$  (1.549 m)   Wt 135 lb 6.4 oz (61.4 kg)   SpO2 99%   BMI 25.58 kg/m    General Appearance: Well nourished  and in no apparent distress.  Eyes: PERRLA, EOMs, conjunctiva no swelling or erythema, normal fundi and vessels. Sinuses: No frontal/maxillary tenderness ENT/Mouth: EACs patent / TMs  nl. Nares clear without erythema, swelling, mucoid exudates. Oral hygiene is good. No erythema, swelling, or exudate. Tongue normal, non-obstructing. Tonsils not swollen or erythematous. Hearing normal.  Neck: Supple, thyroid not palpable. No bruits, nodes or JVD. Respiratory: Respiratory effort normal.  BS equal and clear bilateral without rales, rhonci, wheezing or stridor. Cardio: Heart sounds are normal with regular rate and rhythm and no murmurs, rubs or gallops. Peripheral pulses are normal and equal bilaterally without edema. No aortic or femoral bruits. Chest: symmetric with normal excursions and percussion. Breasts:  Deferred to [facility]. Abdomen: Flat, soft with bowel sounds active. Nontender, no guarding, rebound, hernias, masses, or organomegaly.  Lymphatics: Non tender without lymphadenopathy.  Musculoskeletal: Full ROM all peripheral extremities, joint stability, 5/5 strength, and normal gait. Skin: Warm and dry without rashes, lesions, cyanosis, clubbing or  ecchymosis.  Neuro: Cranial nerves intact, reflexes equal bilaterally. Normal muscle tone, no cerebellar symptoms.  Sensation intact.  Pysch: Alert and oriented X 3, normal affect,  Insight and Judgment appropriate.    Assessment and Plan  1. Annual Preventative Screening Examination   2. Essential hypertension  - EKG 12-Lead - Urinalysis, Routine w reflex microscopic - Microalbumin / creatinine urine ratio - CBC with Differential/Platelet - COMPLETE METABOLIC PANEL WITH GFR - Magnesium - TSH  3. Hyperlipidemia, mixed  - EKG 12-Lead - Lipid panel - TSH  4. Abnormal glucose  - EKG 12-Lead - Hemoglobin A1c - Insulin, random  5. Vitamin D deficiency  - VITAMIN D 25 Hydroxy   6. Rheumatoid arthritis ([place])   7. Hypothyroidism  - TSH  8. Aortic atherosclerosis ([place]) by CXR in 2021  - EKG 12-Lead - Lipid panel  9. Gastroesophageal reflux disease without esophagitis  - CBC with Differential/Platelet  10. Screening for colorectal cancer  - POC Hemoccult Bld/Stl  11. Screening for heart disease  - EKG 12-Lead  12. FHx: heart disease  - EKG 12-Lead  13. Medication management  - Urinalysis, Routine w reflex microscopic - Microalbumin / creatinine urine ratio - CBC with Differential/Platelet - COMPLETE METABOLIC PANEL WITH GFR - Magnesium - Lipid panel - TSH - Hemoglobin A1c - Insulin, random - VITAMIN D 25 Hydroxy          Patient was counseled in prudent diet to achieve/maintain BMI less than 25 for weight control, BP monitoring, regular exercise and medications. Discussed med's effects and SE's. Screening labs and tests as requested with regular follow-up as recommended. Over 40 minutes of exam, counseling, chart review and high complex critical decision making was performed.   Kirtland Bouchard, MD

## 2022-07-21 NOTE — Patient Instructions (Signed)

## 2022-07-22 ENCOUNTER — Ambulatory Visit (INDEPENDENT_AMBULATORY_CARE_PROVIDER_SITE_OTHER): Payer: Medicare PPO | Admitting: Internal Medicine

## 2022-07-22 ENCOUNTER — Encounter: Payer: Self-pay | Admitting: Internal Medicine

## 2022-07-22 VITALS — BP 130/70 | HR 66 | Temp 97.4°F | Resp 17 | Ht 61.0 in | Wt 135.4 lb

## 2022-07-22 DIAGNOSIS — Z0000 Encounter for general adult medical examination without abnormal findings: Secondary | ICD-10-CM | POA: Diagnosis not present

## 2022-07-22 DIAGNOSIS — Z8249 Family history of ischemic heart disease and other diseases of the circulatory system: Secondary | ICD-10-CM | POA: Diagnosis not present

## 2022-07-22 DIAGNOSIS — E039 Hypothyroidism, unspecified: Secondary | ICD-10-CM | POA: Diagnosis not present

## 2022-07-22 DIAGNOSIS — K219 Gastro-esophageal reflux disease without esophagitis: Secondary | ICD-10-CM | POA: Diagnosis not present

## 2022-07-22 DIAGNOSIS — E559 Vitamin D deficiency, unspecified: Secondary | ICD-10-CM

## 2022-07-22 DIAGNOSIS — G720 Drug-induced myopathy: Secondary | ICD-10-CM

## 2022-07-22 DIAGNOSIS — R7309 Other abnormal glucose: Secondary | ICD-10-CM | POA: Diagnosis not present

## 2022-07-22 DIAGNOSIS — M549 Dorsalgia, unspecified: Secondary | ICD-10-CM

## 2022-07-22 DIAGNOSIS — Z79899 Other long term (current) drug therapy: Secondary | ICD-10-CM | POA: Diagnosis not present

## 2022-07-22 DIAGNOSIS — Z0001 Encounter for general adult medical examination with abnormal findings: Secondary | ICD-10-CM

## 2022-07-22 DIAGNOSIS — Z1211 Encounter for screening for malignant neoplasm of colon: Secondary | ICD-10-CM

## 2022-07-22 DIAGNOSIS — I10 Essential (primary) hypertension: Secondary | ICD-10-CM | POA: Diagnosis not present

## 2022-07-22 DIAGNOSIS — I700 Atherosclerosis of aorta: Secondary | ICD-10-CM

## 2022-07-22 DIAGNOSIS — M069 Rheumatoid arthritis, unspecified: Secondary | ICD-10-CM

## 2022-07-22 DIAGNOSIS — Z136 Encounter for screening for cardiovascular disorders: Secondary | ICD-10-CM

## 2022-07-22 DIAGNOSIS — E782 Mixed hyperlipidemia: Secondary | ICD-10-CM | POA: Diagnosis not present

## 2022-07-22 MED ORDER — OMEPRAZOLE 40 MG PO CPDR: DELAYED_RELEASE_CAPSULE | ORAL | 1 refills | Status: DC

## 2022-07-22 MED ORDER — IBUPROFEN 800 MG PO TABS: ORAL_TABLET | ORAL | 1 refills | Status: DC

## 2022-07-23 LAB — URINALYSIS, ROUTINE W REFLEX MICROSCOPIC
Bilirubin Urine: NEGATIVE
Glucose, UA: NEGATIVE
Hgb urine dipstick: NEGATIVE
Hyaline Cast: NONE SEEN /LPF
Nitrite: NEGATIVE
Specific Gravity, Urine: 1.027 (ref 1.001–1.035)
pH: 5.5 (ref 5.0–8.0)

## 2022-07-23 LAB — LIPID PANEL
Cholesterol: 182 mg/dL (ref <200)
HDL: 81 mg/dL (ref >=50)
LDL Cholesterol (Calc): 84 mg/dL (calc)
Non-HDL Cholesterol (Calc): 101 mg/dL (calc) (ref <130)
Total CHOL/HDL Ratio: 2.2 (calc) (ref <5.0)
Triglycerides: 78 mg/dL (ref <150)

## 2022-07-23 LAB — CBC WITH DIFFERENTIAL/PLATELET
Absolute Monocytes: 437 cells/uL (ref 200–950)
Basophils Absolute: 50 cells/uL (ref 0–200)
Basophils Relative: 0.9 %
Eosinophils Absolute: 269 cells/uL (ref 15–500)
Eosinophils Relative: 4.8 %
HCT: 38.7 % (ref 35.0–45.0)
Hemoglobin: 13 g/dL (ref 11.7–15.5)
Lymphs Abs: 1198 cells/uL (ref 850–3900)
MCH: 31.7 pg (ref 27.0–33.0)
MCHC: 33.6 g/dL (ref 32.0–36.0)
MCV: 94.4 fL (ref 80.0–100.0)
MPV: 13.1 fL — ABNORMAL HIGH (ref 7.5–12.5)
Monocytes Relative: 7.8 %
Neutro Abs: 3646 cells/uL (ref 1500–7800)
Neutrophils Relative %: 65.1 %
Platelets: 149 10*3/uL (ref 140–400)
RBC: 4.1 10*6/uL (ref 3.80–5.10)
RDW: 13.6 % (ref 11.0–15.0)
Total Lymphocyte: 21.4 %
WBC: 5.6 10*3/uL (ref 3.8–10.8)

## 2022-07-23 LAB — COMPLETE METABOLIC PANEL WITH GFR
AG Ratio: 2.2 (calc) (ref 1.0–2.5)
ALT: 37 U/L — ABNORMAL HIGH (ref 6–29)
AST: 65 U/L — ABNORMAL HIGH (ref 10–35)
Albumin: 4 g/dL (ref 3.6–5.1)
Alkaline phosphatase (APISO): 73 U/L (ref 37–153)
BUN: 8 mg/dL (ref 7–25)
CO2: 27 mmol/L (ref 20–32)
Calcium: 10.3 mg/dL (ref 8.6–10.4)
Chloride: 106 mmol/L (ref 98–110)
Creat: 0.95 mg/dL (ref 0.60–1.00)
Globulin: 1.8 g/dL (calc) — ABNORMAL LOW (ref 1.9–3.7)
Glucose, Bld: 100 mg/dL — ABNORMAL HIGH (ref 65–99)
Potassium: 3.7 mmol/L (ref 3.5–5.3)
Sodium: 141 mmol/L (ref 135–146)
Total Bilirubin: 1.2 mg/dL (ref 0.2–1.2)
Total Protein: 5.8 g/dL — ABNORMAL LOW (ref 6.1–8.1)
eGFR: 62 mL/min/{1.73_m2} (ref >=60)

## 2022-07-23 LAB — HEMOGLOBIN A1C
Hgb A1c MFr Bld: 4.9 % of total Hgb (ref <5.7)
Mean Plasma Glucose: 94 mg/dL
eAG (mmol/L): 5.2 mmol/L

## 2022-07-23 LAB — MICROALBUMIN / CREATININE URINE RATIO
Creatinine, Urine: 263 mg/dL (ref 20–275)
Microalb Creat Ratio: 6 mcg/mg creat (ref <30)
Microalb, Ur: 1.7 mg/dL

## 2022-07-23 LAB — MAGNESIUM: Magnesium: 1.9 mg/dL (ref 1.5–2.5)

## 2022-07-23 LAB — TSH: TSH: 21.39 mIU/L — ABNORMAL HIGH (ref 0.40–4.50)

## 2022-07-23 LAB — VITAMIN D 25 HYDROXY (VIT D DEFICIENCY, FRACTURES): Vit D, 25-Hydroxy: 75 ng/mL (ref 30–100)

## 2022-07-23 LAB — INSULIN, RANDOM: Insulin: 11.2 u[IU]/mL

## 2022-07-23 NOTE — Progress Notes (Signed)
<><><><><><><><><><><><><><><><><><><><><><><><><><><><><><><><><> <><><><><><><><><><><><><><><><><><><><><><><><><><><><><><><><><> -   Test results slightly outside the reference range are not unusual. If there is anything important, I will review this with you,  otherwise it is considered normal test values.  If you have further questions,  please do not hesitate to contact me at the office or via My Chart.  <><><><><><><><><><><><><><><><><><><><><><><><><><><><><><><><><> <><><><><><><><><><><><><><><><><><><><><><><><><><><><><><><><><>  -  U/A appears was not "Clean Catch"  and does not appear to be an Infection <><><><><><><><><><><><><><><><><><><><><><><><><><><><><><><><><> <><><><><><><><><><><><><><><><><><><><><><><><><><><><><><><><><>  - TSH is very elevated and appears as if you are not taking correctly  PLEASE be absolutely sure                                    that you are not taking any supplements with BIOTIN in it  !  <><><><><><><><><><><><><><><><><><><><><><><><><><><><><><><><><> <><><><><><><><><><><><><><><><><><><><><><><><><><><><><><><><><>  - Total Chol = 182    &   LDP:L Chol = 84   - Both    Excellent   - Very low risk for Heart Attack  / Stroke <><><><><><><><><><><><><><><><><><><><><><><><><><><><><><><><><> <><><><><><><><><><><><><><><><><><><><><><><><><><><><><><><><><>  - A1c - Normal - No Diabetes  - Great !  <><><><><><><><><><><><><><><><><><><><><><><><><><><><><><><><><> <><><><><><><><><><><><><><><><><><><><><><><><><><><><><><><><><>  - Vitamin D = 75 - Excellent   - Please keep dose same  <><><><><><><><><><><><><><><><><><><><><><><><><><><><><><><><><> <><><><><><><><><><><><><><><><><><><><><><><><><><><><><><><><><>  - All Else - CBC - Kidneys - Electrolytes - Liver - Magnesium & Thyroid    - all  Normal /  OK <><><><><><><><><><><><><><><><><><><><><><><><><><><><><><><><><> <><><><><><><><><><><><><><><><><><><><><><><><><><><><><><><><><>

## 2022-07-24 ENCOUNTER — Encounter: Payer: Self-pay | Admitting: Internal Medicine

## 2022-07-26 ENCOUNTER — Other Ambulatory Visit: Payer: Self-pay | Admitting: Internal Medicine

## 2022-07-26 DIAGNOSIS — R946 Abnormal results of thyroid function studies: Secondary | ICD-10-CM

## 2022-07-28 ENCOUNTER — Other Ambulatory Visit: Payer: Self-pay | Admitting: Internal Medicine

## 2022-07-28 DIAGNOSIS — I10 Essential (primary) hypertension: Secondary | ICD-10-CM

## 2022-07-29 IMAGING — CR DG CHEST 2V
2 series · 2 of 2 positions shown · non-contrast
Comparison: Chest radiograph 06/05/2021

CLINICAL DATA: One month post [facility] admission. Persistent cough.

EXAM:
CHEST - 2 VIEW

[w chest pa]
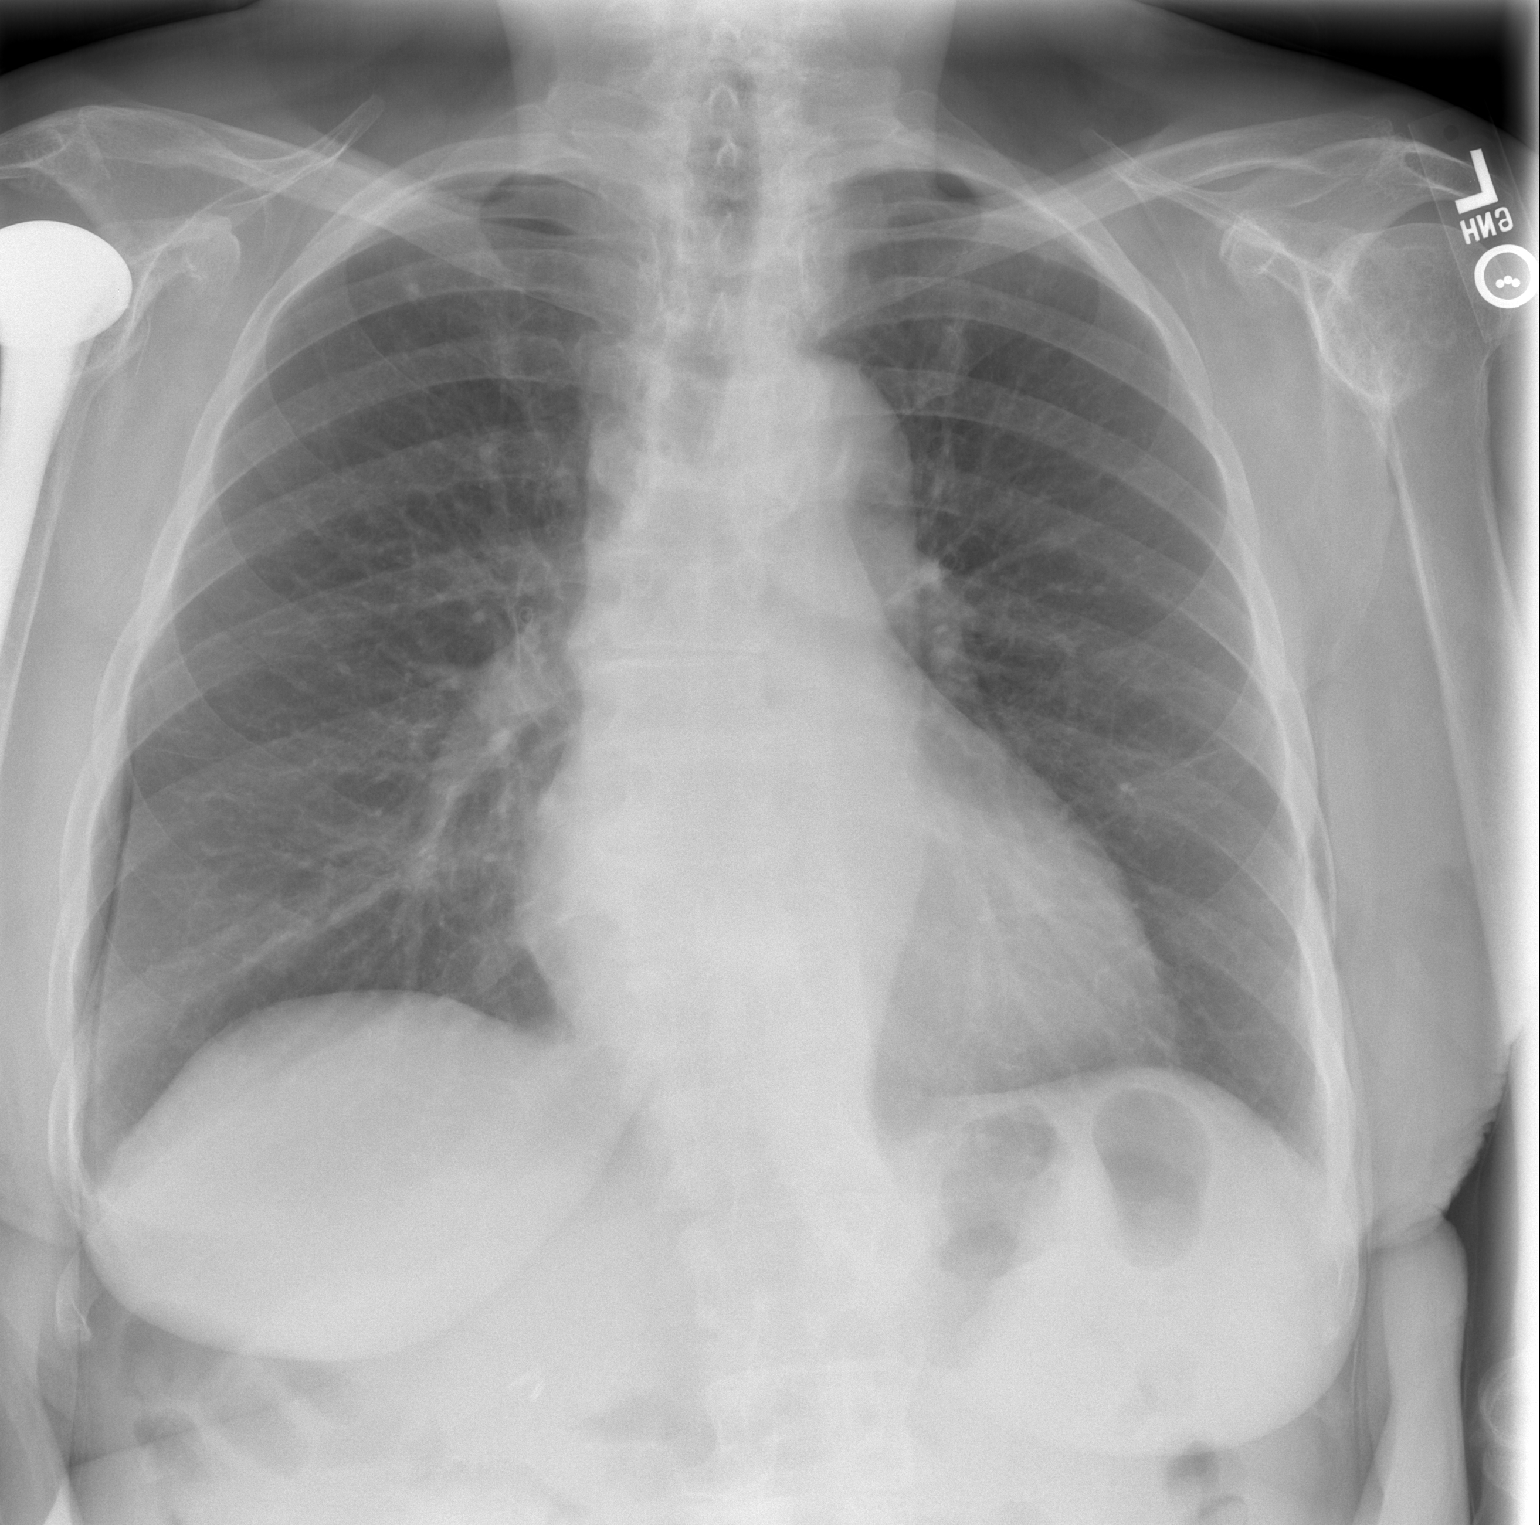

[w chest lat]
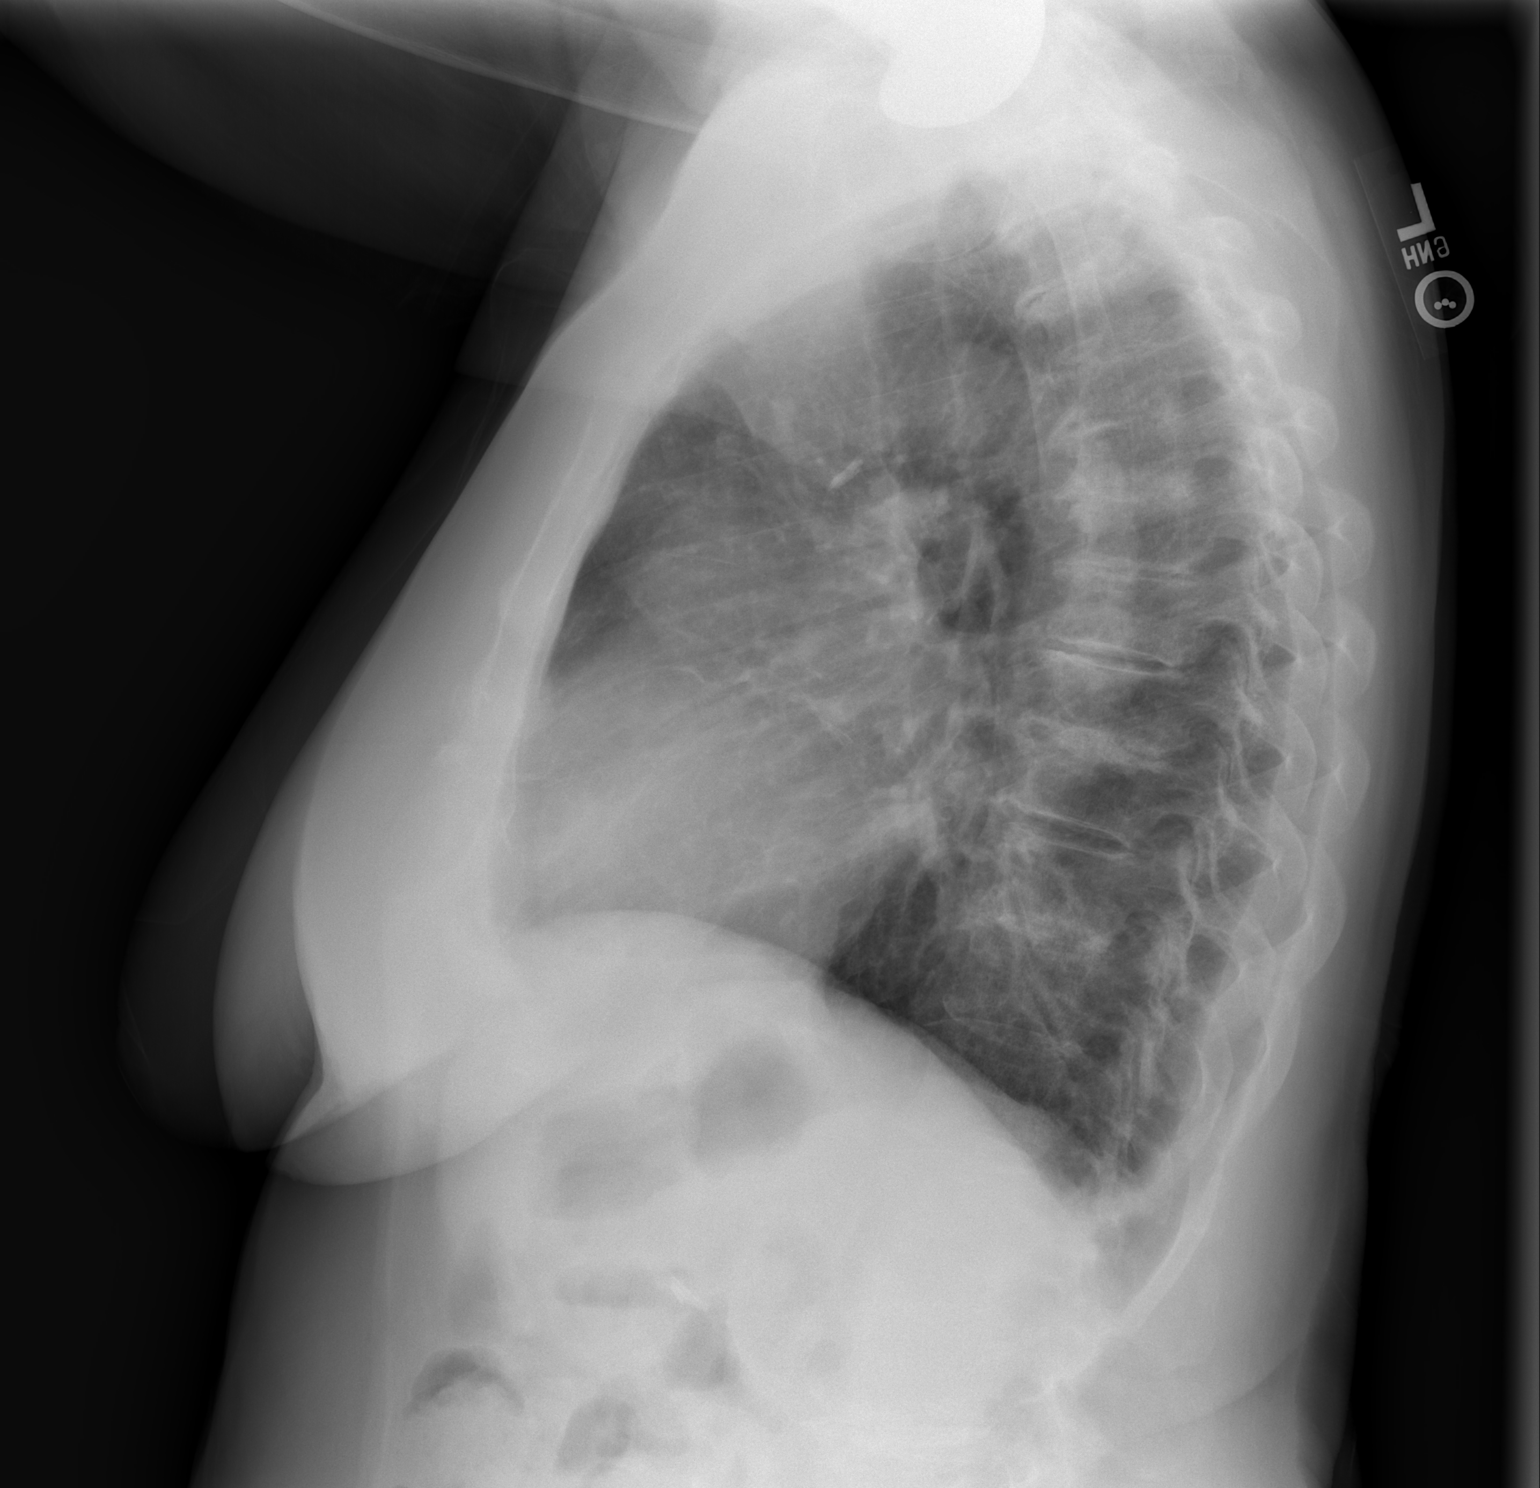

[2 of 2 positions shown; findings below may reference images not displayed]

FINDINGS: The heart size and mediastinal contours are within normal limits.
Aortic calcifications. Both lungs are clear. No new focal
consolidation, pleural effusion, or pneumothorax. A 0.4 mm nodular
density overlying the right apex is stable dating back to radiograph
09/20/2005, likely a benign granuloma. Thin vertical lucencies
overlying the lateral right lung base are likely skin folds.
Partially visualized reverse right shoulder arthroplasty. Surgical
clips overlie the right upper abdomen. Multilevel degenerative disc
disease throughout the thoracic spine.
IMPRESSION: No active cardiopulmonary disease. Stable exam compared to
11/21/2019.

## 2022-08-05 DIAGNOSIS — E039 Hypothyroidism, unspecified: Secondary | ICD-10-CM | POA: Diagnosis not present

## 2022-08-18 ENCOUNTER — Other Ambulatory Visit: Payer: Self-pay | Admitting: Internal Medicine

## 2022-08-18 DIAGNOSIS — Z1231 Encounter for screening mammogram for malignant neoplasm of breast: Secondary | ICD-10-CM

## 2022-09-06 ENCOUNTER — Encounter: Payer: Self-pay | Admitting: Internal Medicine

## 2022-09-09 DIAGNOSIS — M25552 Pain in left hip: Secondary | ICD-10-CM | POA: Diagnosis not present

## 2022-09-09 DIAGNOSIS — M79672 Pain in left foot: Secondary | ICD-10-CM | POA: Diagnosis not present

## 2022-09-21 ENCOUNTER — Ambulatory Visit
Admission: RE | Admit: 2022-09-21 | Discharge: 2022-09-21 | Disposition: A | Discharge status: Home / Self Care | Payer: Medicare PPO | Source: Ambulatory Visit | Attending: Internal Medicine | Admitting: Internal Medicine

## 2022-09-21 DIAGNOSIS — Z1231 Encounter for screening mammogram for malignant neoplasm of breast: Secondary | ICD-10-CM | POA: Diagnosis not present

## 2022-09-23 ENCOUNTER — Ambulatory Visit: Payer: Medicare PPO

## 2022-09-27 DIAGNOSIS — E039 Hypothyroidism, unspecified: Secondary | ICD-10-CM | POA: Diagnosis not present

## 2022-10-04 ENCOUNTER — Other Ambulatory Visit: Payer: Self-pay

## 2022-10-04 ENCOUNTER — Ambulatory Visit (INDEPENDENT_AMBULATORY_CARE_PROVIDER_SITE_OTHER): Payer: Medicare PPO | Admitting: Nurse Practitioner

## 2022-10-04 ENCOUNTER — Encounter: Payer: Self-pay | Admitting: Nurse Practitioner

## 2022-10-04 VITALS — BP 124/58 | HR 66 | Temp 97.9°F | Ht 61.0 in | Wt 135.8 lb

## 2022-10-04 DIAGNOSIS — Z1152 Encounter for screening for COVID-19: Secondary | ICD-10-CM | POA: Diagnosis not present

## 2022-10-04 DIAGNOSIS — J0100 Acute maxillary sinusitis, unspecified: Secondary | ICD-10-CM

## 2022-10-04 DIAGNOSIS — I10 Essential (primary) hypertension: Secondary | ICD-10-CM

## 2022-10-04 DIAGNOSIS — R6889 Other general symptoms and signs: Secondary | ICD-10-CM | POA: Diagnosis not present

## 2022-10-04 LAB — POCT INFLUENZA A/B
Influenza A, POC: NEGATIVE
Influenza B, POC: NEGATIVE

## 2022-10-04 LAB — POC COVID19 BINAXNOW: SARS Coronavirus 2 Ag: NEGATIVE

## 2022-10-04 MED ORDER — AZITHROMYCIN 250 MG PO TABS: ORAL_TABLET | ORAL | 1 refills | Status: DC

## 2022-10-04 MED ORDER — PREDNISONE 20 MG PO TABS: ORAL_TABLET | ORAL | 0 refills | Status: AC

## 2022-10-04 MED ORDER — PROMETHAZINE-DM 6.25-15 MG/5ML PO SYRP: 5.0000 mL | ORAL_SOLUTION | Freq: Four times a day (QID) | ORAL | 1 refills | Status: DC | PRN

## 2022-10-04 NOTE — Progress Notes (Signed)
Assessment and Plan: Diagnoses and all orders for this visit:  Flu-like symptoms -     POCT Influenza A/B- negative  Encounter for screening for COVID-19 -     POC COVID-19- negative  Acute maxillary sinusitis, recurrence not specified Zpack, prednisone and Promethazine DM as directed Push fluids, Tylenol for aches/pain/fever, Mucinex as needed for decongestant If symptoms are not improving by Friday notfiy the office -     azithromycin (ZITHROMAX) 250 MG tablet; Take 2 tablets (500 mg) on  Day 1,  followed by 1 tablet (250 mg) once daily on Days 2 through 5. -     predniSONE (DELTASONE) 20 MG tablet; 3 tablets daily with food for 3 days, 2 tabs daily for 3 days, 1 tab a day for 5 days. -     promethazine-dextromethorphan (PROMETHAZINE-DM) 6.25-15 MG/5ML syrup; Take 5 mLs by mouth 4 (four) times daily as needed for cough.  Essential Hypertension - continue medications, DASH diet, exercise and monitor at home. Call if greater than 130/80.        Further disposition pending results of labs. Discussed med's effects and SE's.   Over 30 minutes of exam, counseling, chart review, and critical decision making was performed.   Future Appointments  Date Time Provider [place]  10/21/2022 10:30 AM Alycia Rossetti, NP [facility] None  01/20/2023 10:30 AM [name], MD [facility] None  04/22/2023 10:00 AM Alycia Rossetti, NP [facility] None  07/25/2023 10:00 AM [name], MD [facility] None    ------------------------------------------------------------------------------------------------------------------   HPI BP (!) 124/58   Pulse 66   Temp 97.9 F (36.6 C)   Ht '5\' 1"'$  (1.549 m)   Wt 135 lb 12.8 oz (61.6 kg)   SpO2 95%   BMI 25.66 kg/m    76 y.o.female presents for sore throat, cough and congestion of green tinged mucus associated with joint and muscle pain, fever, fatigue and weakness. Denies nausea, vomiting, diarrhea. Symptoms have been present for  approximately a week.  Has tried over the counter Mucinex  Bp is well controlled with Olmesartan 20 mg QD, amlodipine 25 mg .  Denies chest pain, shortness of breath and dizziness.  BP Readings from Last 3 Encounters:  10/04/22 (!) 124/58  07/22/22 130/70  05/17/22 130/70     Past Medical History:  Diagnosis Date   Anemia    Hx of    Arthritis    Calculus of bile duct without mention of cholecystitis or obstruction    Cataract    GERD (gastroesophageal reflux disease)    H. pylori infection    Hx of    Heart murmur    Hyperlipidemia    Hypertension    Hypothyroid    PUD (peptic ulcer disease)    Rheumatoid arthritis(714.0)      Allergies  Allergen Reactions   Ace Inhibitors Cough   Ambien [Zolpidem] Other (See Comments)    Odd Feeling   Crestor [Rosuvastatin]    Leflunomide Other (See Comments)   Pravastatin    Prozac [Fluoxetine Hcl] Other (See Comments)    Decreased libido   Zoloft [Sertraline Hcl]     Current Outpatient Medications on File Prior to Visit  Medication Sig   Ascorbic Acid (VITAMIN C) 1000 MG tablet Take 1,000 mg by mouth at bedtime.   aspirin 81 MG tablet Take 81 mg by mouth daily. 3 a week   Cholecalciferol (VITAMIN D PO) Take 5,000 Units by mouth See admin instructions. Takes 3 times a week  folic acid (FOLVITE) 1 MG tablet Take 3 mg by mouth daily.   gabapentin (NEURONTIN) 600 MG tablet TAKE 1/2 TO 1 TABLET 2 TO 3 TIMES DAILY AS NEEDED FOR PAIN   hydroxychloroquine (PLAQUENIL) 200 MG tablet Take 200 mg by mouth 2 (two) times daily.   ibuprofen (ADVIL) 800 MG tablet Take  1 tablet  2 to 3 x /day  with Food for Pain & Inflammation   levothyroxine (SYNTHROID) 75 MCG tablet Take  1 tablet  Daily  on an empty stomach with only water for 30 minutes & no Antacid meds, Calcium or Magnesium for 4 hours & avoid Biotin   magnesium gluconate (MAGONATE) 500 MG tablet Take 500 mg by mouth at bedtime. 3 times a night   methotrexate 50 MG/2ML injection Inject  25 mg into the skin once a week. Saturdays   olmesartan (BENICAR) 20 MG tablet TAKE 1 TABLET EVERY DAY FOR BLOOD PRESSURE   omeprazole (PRILOSEC) 40 MG capsule Take 1 capsule  2 x /day  to Prevent  Heartburn & Indigestion   traZODone (DESYREL) 150 MG tablet TAKE 1 TABLET 1 HOUR BEFORE BEDTIME AS NEEDED FOR SLEEP   zinc gluconate 50 MG tablet Take 50 mg by mouth daily. 3 times a week   amLODipine (NORVASC) 2.5 MG tablet Take 1 tablet (2.5 mg total) by mouth daily.   No current facility-administered medications on file prior to visit.    ROS: all negative except above.   Physical Exam:  BP (!) 124/58   Pulse 66   Temp 97.9 F (36.6 C)   Ht '5\' 1"'$  (1.549 m)   Wt 135 lb 12.8 oz (61.6 kg)   SpO2 95%   BMI 25.66 kg/m   General Appearance: Well nourished, in no apparent distress. Eyes: PERRLA, EOMs, conjunctiva no swelling or erythema Sinuses: Positive maxillary tenderness ENT/Mouth: Ext aud canals clear, TMs bulging, fluid. Erythema of post pharynx with clear post nasal drip. Hearing normal.  Neck: Supple, thyroid normal.  Respiratory: Respiratory effort normal, BS equal bilaterally without rales, rhonchi, wheezing or stridor.  Cardio: RRR with no MRGs. Brisk peripheral pulses without edema.  Abdomen: Soft, + BS.  Non tender, no guarding, rebound, hernias, masses. Lymphatics: positive cervical adenopathy bilaterally Musculoskeletal: Full ROM, 5/5 strength, normal gait.  Skin: Warm, dry without rashes, lesions, ecchymosis.  Neuro: Cranial nerves intact. Normal muscle tone, no cerebellar symptoms. Sensation intact.  Psych: Awake and oriented X 3, normal affect, Insight and Judgment appropriate.     Alycia Rossetti, NP 10:40 AM [facility]

## 2022-10-04 NOTE — Patient Instructions (Signed)

## 2022-10-06 DIAGNOSIS — E039 Hypothyroidism, unspecified: Secondary | ICD-10-CM | POA: Diagnosis not present

## 2022-10-14 DIAGNOSIS — M1991 Primary osteoarthritis, unspecified site: Secondary | ICD-10-CM | POA: Diagnosis not present

## 2022-10-14 DIAGNOSIS — Z79899 Other long term (current) drug therapy: Secondary | ICD-10-CM | POA: Diagnosis not present

## 2022-10-14 DIAGNOSIS — Z6825 Body mass index (BMI) 25.0-25.9, adult: Secondary | ICD-10-CM | POA: Diagnosis not present

## 2022-10-14 DIAGNOSIS — E663 Overweight: Secondary | ICD-10-CM | POA: Diagnosis not present

## 2022-10-14 DIAGNOSIS — M0609 Rheumatoid arthritis without rheumatoid factor, multiple sites: Secondary | ICD-10-CM | POA: Diagnosis not present

## 2022-10-19 NOTE — Progress Notes (Unsigned)
FOLLOW UP 3 MONTH  Assessment & Plan:   Essential hypertension Continue current medications: Olmesartan '40mg'$  daily , Rx Amlodapine 2.'5mg'$  night.  Check blood pressure BID & record.  Follow up in two weeks with log. Monitor blood pressure at home; call if consistently over 130/80 Continue DASH diet.   Reminder to go to the ER if any CP, SOB, nausea, dizziness, severe HA, changes vision/speech, left arm numbness and tingling and jaw pain. -     CBC with Differential/Platelet -     COMPLETE METABOLIC PANEL WITH GFR -     TSH - continue medications, DASH diet, exercise and monitor at home. Call if greater than 130/80.   Hyperlipidemia, mixed -     Lipid panel check lipids decrease fatty foods increase activity.   Hypothyroidism, unspecified type Taking levothyroxine 112 mcg 1.5tablets three days a wek Tue, Thurs, Sat & 1tab four days a week. Reminder to take on an empty stomach 30-12mns before first meal of the day. No antacid medications for 4 hours. -     TSH  Abnormal glucose Discussed disease progression and risks Discussed diet/exercise, weight management and risk modification  Vitamin D deficiency -     VITAMIN D 25 Hydroxy (Vit-D Deficiency, Fractures)  Hyperparathyroidism (HCC) Monitor  Aortic atherosclerosis (HCC)  Control blood pressure, cholesterol, glucose, increase exercise.   Rheumatoid arthritis involving multiple sites, unspecified whether rheumatoid factor present ([place] Continue follow up  Depression, major, single episode, in partial remission ([place] Doing well at this time, continue to monitor No medications:  Discussed stress management techniques  Discussed, increase water,intake & good sleep hygiene  Discussed increasing exercise & vegetables in diet  GERD Doing well at this time Continue: omeprazole '40mg'$  BID PRN Diet discussed Monitor for triggers Avoid food with high acid content Avoid excessive cafeine Increase water intake  Medication  management Continued   Subjective:    Patient ID: [name] female    DOB: 702/14/1947 76y.o.   MRN: [id number] .   76y.o. former smoking (Q96) WF with history of HTN, HLD,history of pre-diabetes (abnormal glucose), overweight, vitamin D def, RA on MTX and plaquenil and vitamin D deficiency presents for follow up.  Her blood pressure has been elevated  She has elevated blood pressure in office today.  Reports she does check at home and she has had some elevated readings in the evening 140's-150's over 80-90.  She is asymptomatic.  She is taking olmesartan '40mg'$  daily.  Of note she had a work up for syncopal episodes/dizziness in 2014 with negative holter that showed PVC/PAC with Dr. BMare Ferrari normal stress test 2016 and normal MRI in 2012. She was taken off her HCTZ at that time. She was recently sent to see neurology, had a normal MRI brain, normal EEG and EMG showed neuropathy. She did not have a MRI cspine.  Her last fall was Jan 2021.    Stress test 2016 Dr. CStanford BreedNuclear stress EF: 64%. The study is normal. This is a low risk study. The left ventricular ejection fraction is normal (55-65%). Never had CXR  Lab Results  Component Value Date   TSH 21.39 (H) 07/22/2022   Colonoscopy and EGD in 2014  She is on gabapentin '100mg'$  1 in AM and 2 at night, xanax, losartan '25mg'$  and off biotin x 1 month.  She sees Dr. HTrudie Reedregularly and states that her RA is well controlled on plaquenil and Methotrexate  She is on fosamax for osteoporosis, stopped and  restarted ago in fall 2018.   she has a diagnosis of insomnia and is currently on melatonin '20mg'$ , advil pm and takes 0.5 mg of xanax (has failed multiple agents in the past including trazodone), reports symptoms are well controlled on current regimen.  she has a diagnosis of GERD with esophagitis and hx of PUD/gastric ulcer for which she continues management by prilosec 40 mg. She will have soft stools, like soft serve,  will also have fecal incontinence. Has the neuropathy. Will suggest she follow up with ortho, will get Xray lumbar, and suggest getting on fiber supplement to bulk stool and to follow up with GI. Colonoscopy was 2015.   BMI is There is no height or weight on file to calculate BMI., she is working on diet and exercise. Wt Readings from Last 3 Encounters:  10/04/22 135 lb 12.8 oz (61.6 kg)  07/22/22 135 lb 6.4 oz (61.4 kg)  05/17/22 140 lb 6.4 oz (63.7 kg)   Her blood pressure is checked at home. Has been taking 50 mg losartan.   BP Readings from Last 3 Encounters:  10/04/22 (!) 124/58  07/22/22 130/70  05/17/22 130/70   She does not workout.  She denies chest pain, shortness of breath, dizziness.   She is not on cholesterol medication and denies myalgias. Her cholesterol is at goal. The cholesterol last visit was:   Lab Results  Component Value Date   CHOL 182 07/22/2022   HDL 81 07/22/2022   LDLCALC 84 07/22/2022   TRIG 78 07/22/2022   CHOLHDL 2.2 07/22/2022   Lab Results  Component Value Date   HGBA1C 4.9 07/22/2022   Patient is on Vitamin D supplement. Lab Results  Component Value Date   VD25OH 23 07/22/2022   She is on thyroid medication. Her medication was NOT changed last visit, 129mg, 1 pill daily.  Patient denies nervousness, palpitations and weight changes. She is on biotin.  Lab Results  Component Value Date   TSH 21.39 (H) 07/22/2022    There were no vitals taken for this visit.  Medications  Current Outpatient Medications (Endocrine & Metabolic):    levothyroxine (SYNTHROID) 75 MCG tablet, Take  1 tablet  Daily  on an empty stomach with only water for 30 minutes & no Antacid meds, Calcium or Magnesium for 4 hours & avoid Biotin  Current Outpatient Medications (Cardiovascular):    amLODipine (NORVASC) 2.5 MG tablet, Take 1 tablet (2.5 mg total) by mouth daily.   olmesartan (BENICAR) 20 MG tablet, TAKE 1 TABLET EVERY DAY FOR BLOOD PRESSURE  Current  Outpatient Medications (Respiratory):    promethazine-dextromethorphan (PROMETHAZINE-DM) 6.25-15 MG/5ML syrup, Take 5 mLs by mouth 4 (four) times daily as needed for cough.  Current Outpatient Medications (Analgesics):    aspirin 81 MG tablet, Take 81 mg by mouth daily. 3 a week   ibuprofen (ADVIL) 800 MG tablet, Take  1 tablet  2 to 3 x /day  with Food for Pain & Inflammation  Current Outpatient Medications (Hematological):    folic acid (FOLVITE) 1 MG tablet, Take 3 mg by mouth daily.  Current Outpatient Medications (Other):    Ascorbic Acid (VITAMIN C) 1000 MG tablet, Take 1,000 mg by mouth at bedtime.   azithromycin (ZITHROMAX) 250 MG tablet, Take 2 tablets (500 mg) on  Day 1,  followed by 1 tablet (250 mg) once daily on Days 2 through 5.   Cholecalciferol (VITAMIN D PO), Take 5,000 Units by mouth See admin instructions. Takes 3 times a week  gabapentin (NEURONTIN) 600 MG tablet, TAKE 1/2 TO 1 TABLET 2 TO 3 TIMES DAILY AS NEEDED FOR PAIN   hydroxychloroquine (PLAQUENIL) 200 MG tablet, Take 200 mg by mouth 2 (two) times daily.   magnesium gluconate (MAGONATE) 500 MG tablet, Take 500 mg by mouth at bedtime. 3 times a night   methotrexate 50 MG/2ML injection, Inject 25 mg into the skin once a week. Saturdays   omeprazole (PRILOSEC) 40 MG capsule, Take 1 capsule  2 x /day  to Prevent  Heartburn & Indigestion   traZODone (DESYREL) 150 MG tablet, TAKE 1 TABLET 1 HOUR BEFORE BEDTIME AS NEEDED FOR SLEEP   zinc gluconate 50 MG tablet, Take 50 mg by mouth daily. 3 times a week  Problem list She has Essential hypertension; Rheumatoid arthritis ([place]); Hyperlipidemia, mixed; Gastroesophageal reflux disease without esophagitis; Personal history of colonic polyps; Vitamin D deficiency; Abnormal glucose; Medication management; Hypothyroidism; Osteoporosis; S/P shoulder replacement, right; Hyperparathyroidism ([place]); Overweight (BMI 25.0-29.9); Insomnia; Neuropathy; Aortic atherosclerosis ([place]) by CXR in  2021; Depression, major, single episode, in partial remission ([place]); COVID-19; Hyponatremia; COVID-19 virus infection; Staphylococcus epidermidis bacteremia; and Statin myopathy on their problem list.  Allergies Allergies  Allergen Reactions   Ace Inhibitors Cough   Ambien [Zolpidem] Other (See Comments)    Odd Feeling   Crestor [Rosuvastatin]    Leflunomide Other (See Comments)   Pravastatin    Prozac [Fluoxetine Hcl] Other (See Comments)    Decreased libido   Zoloft [Sertraline Hcl]     SURGICAL HISTORY She  has a past surgical history that includes Vaginal hysterectomy; Carpal tunnel release; Cystectomy; AV fistula repair; Bladder suspension; Pilonidal cyst excision; Trigger finger release; Bunionectomy; ERCP (09/29/2011); Tonsillectomy; Cholecystectomy (09/07/2011); Tenolysis (10/26/2011); Total shoulder replacement (Right, 02/22/2014); and Breast cyst excision (Left). FAMILY HISTORY Her family history includes Breast cancer in her paternal aunt; Colon cancer in her maternal grandfather; Heart attack in her brother and father; Lung cancer in her mother; Prostate cancer in her paternal grandfather; Stomach cancer in her father. SOCIAL HISTORY She  reports that she quit smoking about 27 years ago. Her smoking use included cigarettes. She has never used smokeless tobacco. She reports current alcohol use. She reports that she does not use drugs.  Review of Systems  Constitutional:  Negative for chills, diaphoresis, fever, malaise/fatigue and weight loss.  HENT:  Negative for congestion, ear discharge, ear pain, hearing loss, nosebleeds, sinus pain, sore throat and tinnitus.   Eyes:  Negative for blurred vision, double vision, photophobia, pain, discharge and redness.  Respiratory:  Negative for cough, hemoptysis, sputum production, shortness of breath, wheezing and stridor.   Cardiovascular:  Negative for chest pain, palpitations, orthopnea, claudication, leg swelling and PND.   Gastrointestinal:  Negative for abdominal pain, blood in stool, constipation, diarrhea, heartburn, melena, nausea and vomiting.  Genitourinary:  Negative for dysuria, flank pain, frequency, hematuria and urgency.  Musculoskeletal:  Positive for joint pain. Negative for back pain, falls, myalgias and neck pain.  Skin:  Negative for itching and rash.  Neurological:  Negative for dizziness, tingling, tremors, sensory change, speech change, focal weakness, seizures, loss of consciousness, weakness and headaches.  Endo/Heme/Allergies:  Negative for environmental allergies and polydipsia. Does not bruise/bleed easily.  Psychiatric/Behavioral:  Negative for depression, hallucinations, memory loss, substance abuse and suicidal ideas. The patient is not nervous/anxious and does not have insomnia.         Objective:   Physical Exam General appearance: alert, no distress, WD/WN,  female HEENT: normocephalic, sclerae anicteric,  TMs pearly, nares patent, no discharge or erythema, pharynx normal Oral cavity: MMM, no lesions Neck: supple, no lymphadenopathy, no thyromegaly, no masses Heart: RRR, normal S1, S2, 1/6 systolic murmur RSB Lungs: CTA bilaterally, no wheezes, rhonchi, or rales Abdomen: +bs, soft, nontender, non distended, no masses, no hepatomegaly, no splenomegaly Musculoskeletal:  no swelling, no obvious deformity Extremities: no edema, no cyanosis, no clubbing Pulses: 2+ symmetric, upper and lower extremities, normal cap refill Neurological: alert, oriented x 3, CN2-12 intact, tremor right thumb at rest, strength normal upper extremities and lower extremities, good finger to nose, DTRs 2+ throughout, decreased sensation bilateral feet and hands, + romberg, gait normal Psychiatric: normal affect, behavior normal, pleasant  Breast: defer Gyn: defer Rectal: defer   [facility]  10/19/2022

## 2022-10-21 ENCOUNTER — Encounter: Payer: Self-pay | Admitting: Nurse Practitioner

## 2022-10-21 ENCOUNTER — Ambulatory Visit (INDEPENDENT_AMBULATORY_CARE_PROVIDER_SITE_OTHER): Payer: Medicare PPO | Admitting: Nurse Practitioner

## 2022-10-21 VITALS — BP 130/68 | HR 54 | Temp 97.5°F | Ht 61.0 in | Wt 139.6 lb

## 2022-10-21 DIAGNOSIS — M069 Rheumatoid arthritis, unspecified: Secondary | ICD-10-CM

## 2022-10-21 DIAGNOSIS — R748 Abnormal levels of other serum enzymes: Secondary | ICD-10-CM

## 2022-10-21 DIAGNOSIS — E782 Mixed hyperlipidemia: Secondary | ICD-10-CM

## 2022-10-21 DIAGNOSIS — Z79899 Other long term (current) drug therapy: Secondary | ICD-10-CM

## 2022-10-21 DIAGNOSIS — E213 Hyperparathyroidism, unspecified: Secondary | ICD-10-CM

## 2022-10-21 DIAGNOSIS — F324 Major depressive disorder, single episode, in partial remission: Secondary | ICD-10-CM | POA: Diagnosis not present

## 2022-10-21 DIAGNOSIS — R7309 Other abnormal glucose: Secondary | ICD-10-CM | POA: Diagnosis not present

## 2022-10-21 DIAGNOSIS — K219 Gastro-esophageal reflux disease without esophagitis: Secondary | ICD-10-CM

## 2022-10-21 DIAGNOSIS — E039 Hypothyroidism, unspecified: Secondary | ICD-10-CM

## 2022-10-21 DIAGNOSIS — I10 Essential (primary) hypertension: Secondary | ICD-10-CM | POA: Diagnosis not present

## 2022-10-21 DIAGNOSIS — E559 Vitamin D deficiency, unspecified: Secondary | ICD-10-CM | POA: Diagnosis not present

## 2022-10-21 DIAGNOSIS — I700 Atherosclerosis of aorta: Secondary | ICD-10-CM | POA: Diagnosis not present

## 2022-10-21 DIAGNOSIS — E663 Overweight: Secondary | ICD-10-CM

## 2022-10-21 MED ORDER — AMLODIPINE BESYLATE 2.5 MG PO TABS: 2.5000 mg | ORAL_TABLET | Freq: Every day | ORAL | 3 refills | Status: DC

## 2022-10-21 NOTE — Patient Instructions (Signed)

## 2022-10-22 LAB — CBC WITH DIFFERENTIAL/PLATELET
Absolute Monocytes: 470 cells/uL (ref 200–950)
Basophils Absolute: 52 cells/uL (ref 0–200)
Basophils Relative: 1.1 %
Eosinophils Absolute: 320 cells/uL (ref 15–500)
Eosinophils Relative: 6.8 %
HCT: 33.8 % — ABNORMAL LOW (ref 35.0–45.0)
Hemoglobin: 11.3 g/dL — ABNORMAL LOW (ref 11.7–15.5)
Lymphs Abs: 1166 cells/uL (ref 850–3900)
MCH: 31.2 pg (ref 27.0–33.0)
MCHC: 33.4 g/dL (ref 32.0–36.0)
MCV: 93.4 fL (ref 80.0–100.0)
MPV: 12.9 fL — ABNORMAL HIGH (ref 7.5–12.5)
Monocytes Relative: 10 %
Neutro Abs: 2693 cells/uL (ref 1500–7800)
Neutrophils Relative %: 57.3 %
Platelets: 128 10*3/uL — ABNORMAL LOW (ref 140–400)
RBC: 3.62 10*6/uL — ABNORMAL LOW (ref 3.80–5.10)
RDW: 13.1 % (ref 11.0–15.0)
Total Lymphocyte: 24.8 %
WBC: 4.7 10*3/uL (ref 3.8–10.8)

## 2022-10-22 LAB — LIPID PANEL
Cholesterol: 164 mg/dL (ref <200)
HDL: 69 mg/dL (ref >=50)
LDL Cholesterol (Calc): 80 mg/dL (calc)
Non-HDL Cholesterol (Calc): 95 mg/dL (calc) (ref <130)
Total CHOL/HDL Ratio: 2.4 (calc) (ref <5.0)
Triglycerides: 70 mg/dL (ref <150)

## 2022-10-22 LAB — COMPLETE METABOLIC PANEL WITH GFR
AG Ratio: 2.1 (calc) (ref 1.0–2.5)
ALT: 17 U/L (ref 6–29)
AST: 27 U/L (ref 10–35)
Albumin: 3.7 g/dL (ref 3.6–5.1)
Alkaline phosphatase (APISO): 70 U/L (ref 37–153)
BUN: 9 mg/dL (ref 7–25)
CO2: 30 mmol/L (ref 20–32)
Calcium: 10.5 mg/dL — ABNORMAL HIGH (ref 8.6–10.4)
Chloride: 107 mmol/L (ref 98–110)
Creat: 0.87 mg/dL (ref 0.60–1.00)
Globulin: 1.8 g/dL (calc) — ABNORMAL LOW (ref 1.9–3.7)
Glucose, Bld: 81 mg/dL (ref 65–99)
Potassium: 4.5 mmol/L (ref 3.5–5.3)
Sodium: 142 mmol/L (ref 135–146)
Total Bilirubin: 0.6 mg/dL (ref 0.2–1.2)
Total Protein: 5.5 g/dL — ABNORMAL LOW (ref 6.1–8.1)
eGFR: 69 mL/min/{1.73_m2} (ref >=60)

## 2022-10-22 LAB — TSH: TSH: 0.05 mIU/L — ABNORMAL LOW (ref 0.40–4.50)

## 2022-11-17 ENCOUNTER — Other Ambulatory Visit: Payer: Self-pay | Admitting: Physician Assistant

## 2022-11-17 DIAGNOSIS — I10 Essential (primary) hypertension: Secondary | ICD-10-CM

## 2022-11-29 DIAGNOSIS — E039 Hypothyroidism, unspecified: Secondary | ICD-10-CM | POA: Diagnosis not present

## 2023-01-13 ENCOUNTER — Other Ambulatory Visit: Payer: Self-pay | Admitting: Internal Medicine

## 2023-01-13 DIAGNOSIS — K219 Gastro-esophageal reflux disease without esophagitis: Secondary | ICD-10-CM

## 2023-01-14 DIAGNOSIS — M0609 Rheumatoid arthritis without rheumatoid factor, multiple sites: Secondary | ICD-10-CM | POA: Diagnosis not present

## 2023-01-19 ENCOUNTER — Encounter: Payer: Self-pay | Admitting: Internal Medicine

## 2023-01-19 NOTE — Progress Notes (Signed)
Future Appointments  Date Time Provider Department  01/20/2023              6 mo ov 10:30 AM [name], MD [facility]  04/22/2023              wellness 10:00 AM Alycia Rossetti, NP [facility]  07/25/2023              cpe 10:00 AM [name], MD [facility]    History of Present Illness:      This very nice 77 y.o. MWF with HTN, HLD, Rheumatoid Arthritis, Prediabetes , Hypothyroidism  and Vitamin D Deficiency  presents for  6 month follow-up.  Patient is also  followed  by Dr Berna Bue and is on MTX /Plaquenil for Rheumatoid Arthritis (1985) . CXR in 2021 showed Aortic Atherosclerosis. Her GERD is controlled on her meds.       HTN predates since 1997. Patient's BP has been controlled at home and patient denies any cardiac symptoms as chest pain, palpitations, shortness of breath or ankle swelling.  Today's BP is at goal - 120/64.                                               Patient has hx/o prediabetes (A1c 5.8% /2011) and patient denies reactive hypoglycemic symptoms, visual blurring, diabetic polys or paresthesias. Last A1c was normal & at goal:   Lab Results  Component Value Date   HGBA1C 4.9 07/22/2022                                                     Finally, patient has history of Vitamin D Deficiency  ("38" /2008) and last Vitamin D was at goal:   Lab Results  Component Value Date   VD25OH 75 07/22/2022            Patient was diagnosed Hypothyroid  and has been on thyroid replacement since the 1980's.    Medications    alendronate (FOSAMAX) 70 MG tablet, TAKE 1 TABLET ONE TIME WEEKLY    levothyroxine  150 MCG tablet, Take  1 tablet  Daily       amLODipine (NORVASC) 5 MG tablet, Take 1 tablet  daily.   olmesartan (BENICAR) 20 MG tablet, TAKE 1 TABLET EVERY DAY     aspirin 81 MG tablet, Take daily. 3 a week   folic acid ( 1 MG tablet, Take 3 mg daily.   VITAMIN C 1000 MG tablet, Take 1,000 mg  daily. 3 a week   VITAMIN D 5,000 Units , Takes  3 times a week   gabapentin 600 MG tablet, TAKE 1/2 TO 1 TABLET 2-3 x /day  DAILY AS NEEDED FOR PAIN   hydroxychloroquine (PLAQUENIL) 200 MG tablet, Take  2 ( times daily.   MELATONIN , Take 1 tablet b at bedtime.   methotrexate 50 MG/2ML injec, Inject 25 mg into the skin once a week. Thursdays   omeprazole  40 MG capsule, TAKE 1 CAPSULE TWICE DAILY  as needed   traZODone 150 MG tablet, Take  1 tablet  1 hour  before Bedtime  as needed f   zinc gluconate 50 MG tablet, Take  daily. 3 times a week  Problem list She has Essential hypertension; Rheumatoid arthritis ([place]); Hyperlipidemia, mixed; Gastroesophageal reflux disease without esophagitis; Personal history of colonic polyps; Vitamin D deficiency; Abnormal glucose; Medication management; Hypothyroidism; Osteoporosis; S/P shoulder replacement, right; Hyperparathyroidism ([place]); Overweight (BMI 25.0-29.9); Insomnia; Neuropathy; Aortic atherosclerosis ([place]) by CXR in 2021; Depression, major, single episode, in partial remission ([place]); COVID-19; Hyponatremia; COVID-19 virus infection; and Staphylococcus epidermidis bacteremia on their problem list.   Observations/Objective:  BP 120/64   Pulse (!) 54   Temp 97.9 F (36.6 C)   Resp 17   Ht '5\' 1"'$  (1.549 m)   Wt 141 lb (64 kg)   SpO2 99%   BMI 26.64 kg/m   HEENT - WNL. Neck - supple.  Chest - Clear equal BS. Cor - Nl HS. RRR w/o sig MGR. PP 1(+). No edema. MS- FROM w/o deformities.  Gait Nl. Neuro -  Nl w/o focal abnormalities. Skin - Diffuse scalp hair thinning   Assessment and Plan:   1. Essential hypertension  - CBC with Differential/Platelet - COMPLETE METABOLIC PANEL WITH GFR - Magnesium - TSH  2. Hyperlipidemia, mixed  - Lipid panel  3. Abnormal glucose  - Hemoglobin A1c - Insulin, random  4. Vitamin D deficiency  - VITAMIN D 25 Hydroxy   5. Hypothyroidism, unspecified type  - TSH  6. Aortic atherosclerosis ([place]) by CXR in 2021  - Lipid panel  7.  Rheumatoid arthritis  ([place])   8. Medication management  - CBC with Differential/Platelet - COMPLETE METABOLIC PANEL WITH GFR - Magnesium - Lipid panel - TSH - Hemoglobin A1c - Insulin, random - VITAMIN D 25 Hydroxy   Follow Up Instructions:        I discussed the assessment and treatment plan with the patient. The patient was provided an opportunity to ask questions and all were answered. The patient agreed with the plan and demonstrated an understanding of the instructions.        The patient was advised to call back or seek an in-person evaluation if the symptoms worsen or if the condition fails to improve as anticipated.   Kirtland Bouchard, MD

## 2023-01-19 NOTE — Patient Instructions (Signed)

## 2023-01-20 ENCOUNTER — Encounter: Payer: Self-pay | Admitting: Internal Medicine

## 2023-01-20 ENCOUNTER — Ambulatory Visit (INDEPENDENT_AMBULATORY_CARE_PROVIDER_SITE_OTHER): Payer: Medicare PPO | Admitting: Internal Medicine

## 2023-01-20 VITALS — BP 120/64 | HR 54 | Temp 97.9°F | Resp 17 | Ht 61.0 in | Wt 141.0 lb

## 2023-01-20 DIAGNOSIS — M069 Rheumatoid arthritis, unspecified: Secondary | ICD-10-CM | POA: Diagnosis not present

## 2023-01-20 DIAGNOSIS — R7309 Other abnormal glucose: Secondary | ICD-10-CM | POA: Diagnosis not present

## 2023-01-20 DIAGNOSIS — I700 Atherosclerosis of aorta: Secondary | ICD-10-CM | POA: Diagnosis not present

## 2023-01-20 DIAGNOSIS — I10 Essential (primary) hypertension: Secondary | ICD-10-CM | POA: Diagnosis not present

## 2023-01-20 DIAGNOSIS — E559 Vitamin D deficiency, unspecified: Secondary | ICD-10-CM | POA: Diagnosis not present

## 2023-01-20 DIAGNOSIS — E039 Hypothyroidism, unspecified: Secondary | ICD-10-CM | POA: Diagnosis not present

## 2023-01-20 DIAGNOSIS — Z79899 Other long term (current) drug therapy: Secondary | ICD-10-CM

## 2023-01-20 DIAGNOSIS — E782 Mixed hyperlipidemia: Secondary | ICD-10-CM

## 2023-01-21 LAB — COMPLETE METABOLIC PANEL WITH GFR
AG Ratio: 1.8 (calc) (ref 1.0–2.5)
ALT: 26 U/L (ref 6–29)
AST: 35 U/L (ref 10–35)
Albumin: 3.8 g/dL (ref 3.6–5.1)
Alkaline phosphatase (APISO): 84 U/L (ref 37–153)
BUN/Creatinine Ratio: 7 (calc) (ref 6–22)
BUN: 6 mg/dL — ABNORMAL LOW (ref 7–25)
CO2: 30 mmol/L (ref 20–32)
Calcium: 10.3 mg/dL (ref 8.6–10.4)
Chloride: 105 mmol/L (ref 98–110)
Creat: 0.92 mg/dL (ref 0.60–1.00)
Globulin: 2.1 g/dL (calc) (ref 1.9–3.7)
Glucose, Bld: 93 mg/dL (ref 65–99)
Potassium: 4.2 mmol/L (ref 3.5–5.3)
Sodium: 142 mmol/L (ref 135–146)
Total Bilirubin: 0.6 mg/dL (ref 0.2–1.2)
Total Protein: 5.9 g/dL — ABNORMAL LOW (ref 6.1–8.1)
eGFR: 65 mL/min/{1.73_m2} (ref >=60)

## 2023-01-21 LAB — CBC WITH DIFFERENTIAL/PLATELET
Absolute Monocytes: 342 cells/uL (ref 200–950)
Basophils Absolute: 50 cells/uL (ref 0–200)
Basophils Relative: 1.1 %
Eosinophils Absolute: 342 cells/uL (ref 15–500)
Eosinophils Relative: 7.6 %
HCT: 38.3 % (ref 35.0–45.0)
Hemoglobin: 12.6 g/dL (ref 11.7–15.5)
Lymphs Abs: 1116 cells/uL (ref 850–3900)
MCH: 30.1 pg (ref 27.0–33.0)
MCHC: 32.9 g/dL (ref 32.0–36.0)
MCV: 91.6 fL (ref 80.0–100.0)
MPV: 12.6 fL — ABNORMAL HIGH (ref 7.5–12.5)
Monocytes Relative: 7.6 %
Neutro Abs: 2651 cells/uL (ref 1500–7800)
Neutrophils Relative %: 58.9 %
Platelets: 143 10*3/uL (ref 140–400)
RBC: 4.18 10*6/uL (ref 3.80–5.10)
RDW: 13.4 % (ref 11.0–15.0)
Total Lymphocyte: 24.8 %
WBC: 4.5 10*3/uL (ref 3.8–10.8)

## 2023-01-21 LAB — HEMOGLOBIN A1C
Hgb A1c MFr Bld: 5.2 % of total Hgb (ref <5.7)
Mean Plasma Glucose: 103 mg/dL
eAG (mmol/L): 5.7 mmol/L

## 2023-01-21 LAB — TSH: TSH: 0.29 mIU/L — ABNORMAL LOW (ref 0.40–4.50)

## 2023-01-21 LAB — MAGNESIUM: Magnesium: 2 mg/dL (ref 1.5–2.5)

## 2023-01-21 LAB — LIPID PANEL
Cholesterol: 167 mg/dL (ref <200)
HDL: 72 mg/dL (ref >=50)
LDL Cholesterol (Calc): 79 mg/dL (calc)
Non-HDL Cholesterol (Calc): 95 mg/dL (calc) (ref <130)
Total CHOL/HDL Ratio: 2.3 (calc) (ref <5.0)
Triglycerides: 77 mg/dL (ref <150)

## 2023-01-21 LAB — VITAMIN D 25 HYDROXY (VIT D DEFICIENCY, FRACTURES): Vit D, 25-Hydroxy: 85 ng/mL (ref 30–100)

## 2023-01-21 LAB — INSULIN, RANDOM: Insulin: 11.6 u[IU]/mL

## 2023-01-23 ENCOUNTER — Other Ambulatory Visit: Payer: Self-pay | Admitting: Internal Medicine

## 2023-01-23 DIAGNOSIS — E039 Hypothyroidism, unspecified: Secondary | ICD-10-CM

## 2023-01-23 MED ORDER — LEVOTHYROXINE SODIUM 50 MCG PO TABS: ORAL_TABLET | ORAL | 1 refills | Status: DC

## 2023-01-23 NOTE — Progress Notes (Signed)
<><><><><><><><><><><><><><><><><><><><><><><><><><><><><><><><><> <><><><><><><><><><><><><><><><><><><><><><><><><><><><><><><><><>  -   TSH is too low , which means thyroid hormone level too high in blood , So   - Sent in [place] Rx for levothyroxine 50 mcg to take only 1 tablet / day    And Important to take   on an empty stomach        with only water for 30 minutes &   no Antacid meds, Calcium or Magnesium for 4 hours & avoid Biotin   - Please call office to schedule a Nurse visit in 1 months to recheck thyroid level   <><><><><><><><><><><><><><><><><><><><><><><><><><><><><><><><><> <><><><><><><><><><><><><><><><><><><><><><><><><><><><><><><><><>  -  Chol  = 1 67      &    LDL Chol  =  79      Both    Excellent   - Very low risk for Heart Attack  / Stroke <><><><><><><><><><><><><><><><><><><><><><><><><><><><><><><><><>  - A1c  - Normal  - No Diabetes  - Great !  <><><><><><><><><><><><><><><><><><><><><><><><><><><><><><><><><>  - Vitamin D = 85  - Excellent - Please keep dosage Same   <><><><><><><><><><><><><><><><><><><><><><><><><><><><><><><><><>  - All Else - CBC - Kidneys - Electrolytes - Liver - Magnesium & Thyroid    - all  Normal / OK <><><><><><><><><><><><><><><><><><><><><><><><><><><><><><><><><> <><><><><><><><><><><><><><><><><><><><><><><><><><><><><><><><><>

## 2023-01-23 NOTE — Progress Notes (Signed)
<><><><><><><><><><><><><><><><><><><><><><><><><><><><><><><><><> <><><><><><><><><><><><><><><><><><><><><><><><><><><><><><><><><> -   Test results slightly outside the reference range are not unusual. If there is anything important, I will review this with you,  otherwise it is considered normal test values.  If you have further questions,  please do not hesitate to contact me at the office or via My Chart.  <><><><><><><><><><><><><><><><><><><><><><><><><><><><><><><><><> <><><><><><><><><><><><><><><><><><><><><><><><><><><><><><><><><>  -     

## 2023-02-01 DIAGNOSIS — M170 Bilateral primary osteoarthritis of knee: Secondary | ICD-10-CM | POA: Diagnosis not present

## 2023-02-01 DIAGNOSIS — M1711 Unilateral primary osteoarthritis, right knee: Secondary | ICD-10-CM | POA: Diagnosis not present

## 2023-02-01 DIAGNOSIS — M1712 Unilateral primary osteoarthritis, left knee: Secondary | ICD-10-CM | POA: Diagnosis not present

## 2023-02-02 DIAGNOSIS — E039 Hypothyroidism, unspecified: Secondary | ICD-10-CM | POA: Diagnosis not present

## 2023-02-08 DIAGNOSIS — M170 Bilateral primary osteoarthritis of knee: Secondary | ICD-10-CM | POA: Diagnosis not present

## 2023-02-08 DIAGNOSIS — M1711 Unilateral primary osteoarthritis, right knee: Secondary | ICD-10-CM | POA: Diagnosis not present

## 2023-02-08 DIAGNOSIS — M1712 Unilateral primary osteoarthritis, left knee: Secondary | ICD-10-CM | POA: Diagnosis not present

## 2023-02-14 DIAGNOSIS — R945 Abnormal results of liver function studies: Secondary | ICD-10-CM | POA: Diagnosis not present

## 2023-02-16 ENCOUNTER — Other Ambulatory Visit: Payer: Self-pay | Admitting: Rheumatology

## 2023-02-16 DIAGNOSIS — R7989 Other specified abnormal findings of blood chemistry: Secondary | ICD-10-CM

## 2023-02-24 DIAGNOSIS — M5441 Lumbago with sciatica, right side: Secondary | ICD-10-CM | POA: Diagnosis not present

## 2023-02-24 DIAGNOSIS — M5442 Lumbago with sciatica, left side: Secondary | ICD-10-CM | POA: Diagnosis not present

## 2023-03-04 ENCOUNTER — Telehealth: Payer: Self-pay | Admitting: Internal Medicine

## 2023-03-04 NOTE — Progress Notes (Signed)
Chart review- lvm w/ patient to confirm outreach from [facility] on 03/08/23- total time 

## 2023-03-05 ENCOUNTER — Other Ambulatory Visit: Payer: Self-pay | Admitting: Nurse Practitioner

## 2023-03-05 DIAGNOSIS — I10 Essential (primary) hypertension: Secondary | ICD-10-CM

## 2023-03-07 DIAGNOSIS — M545 Low back pain, unspecified: Secondary | ICD-10-CM | POA: Diagnosis not present

## 2023-03-08 ENCOUNTER — Telehealth: Payer: Self-pay | Admitting: *Deleted

## 2023-03-08 DIAGNOSIS — E213 Hyperparathyroidism, unspecified: Secondary | ICD-10-CM | POA: Diagnosis not present

## 2023-03-08 DIAGNOSIS — I10 Essential (primary) hypertension: Secondary | ICD-10-CM | POA: Diagnosis not present

## 2023-03-08 DIAGNOSIS — E039 Hypothyroidism, unspecified: Secondary | ICD-10-CM | POA: Diagnosis not present

## 2023-03-08 DIAGNOSIS — E782 Mixed hyperlipidemia: Secondary | ICD-10-CM | POA: Diagnosis not present

## 2023-03-08 NOTE — Telephone Encounter (Signed)
CRN completed routine outreach call to patient.  1. HTN: BPs have been stable and within normal range. Denies any chest pain, sob or palpitations. She is taking all medications as prescribed.  2. Rheumatoid arthritis: Continues with pain from RA. Says she has pain all the time. She takes Ibuprofen which does help make the pain more tolerable and on Methotrexate weekly. She had a MRI done on Monday and says she is supposed to receive a call with the results on Thursday.   Next CP outreach 09/06/23  CRN Televisit: 10 min

## 2023-03-09 DIAGNOSIS — M5441 Lumbago with sciatica, right side: Secondary | ICD-10-CM | POA: Diagnosis not present

## 2023-03-09 DIAGNOSIS — M5442 Lumbago with sciatica, left side: Secondary | ICD-10-CM | POA: Diagnosis not present

## 2023-03-09 DIAGNOSIS — M170 Bilateral primary osteoarthritis of knee: Secondary | ICD-10-CM | POA: Diagnosis not present

## 2023-03-15 DIAGNOSIS — M48062 Spinal stenosis, lumbar region with neurogenic claudication: Secondary | ICD-10-CM | POA: Diagnosis not present

## 2023-03-17 ENCOUNTER — Ambulatory Visit
Admission: RE | Admit: 2023-03-17 | Discharge: 2023-03-17 | Disposition: A | Discharge status: Home / Self Care | Payer: Medicare PPO | Source: Ambulatory Visit | Attending: Rheumatology | Admitting: Rheumatology

## 2023-03-17 DIAGNOSIS — R7989 Other specified abnormal findings of blood chemistry: Secondary | ICD-10-CM | POA: Diagnosis not present

## 2023-03-21 ENCOUNTER — Other Ambulatory Visit: Payer: Self-pay | Admitting: Internal Medicine

## 2023-03-21 DIAGNOSIS — M549 Dorsalgia, unspecified: Secondary | ICD-10-CM

## 2023-03-22 NOTE — Progress Notes (Signed)
Initial neurology clinic note  Reason for Evaluation: Consultation requested by Tammie Geralds, MD for an opinion regarding difficulty with ambulation. My final recommendations will be communicated back to the requesting physician by way of shared medical record or letter to requesting physician via [place] mail.  HPI: This is Ms. [name], a 77 y.o. right-handed female with a medical history of HTN, HLD, RA, OA, pre-diabetes, hypothyroidism, and vit D deficiency who presents to neurology clinic with the chief complaint of weakness in legs and pain. The patient is accompanied by husband.  Patient has been weak in her legs since the end of 01/2022. She had pain in the knees bilaterally (medial aspect). There was initially concern for RA or OA. She saw ortho [employer]) who gave her injections in her knees. That will help for a short period of time. She had an MRI had [facility] that showed spinal stenosis (see results below). She had injections in her back yesterday (03/29/23) by Dr. Modesto Charon at [facility]. Her pain has greatly improved and she may be walking better as well. She denies significant numbness and tingling and denies significant pain in her muscles. She also denies significant back pain, even prior to injections yesterday.  She denies any symptoms in her arms, including weakness, numbness, and tingling. She denies oculobulbar symptoms such as diplopia, ptosis, difficulty chewing, dysphagia, dysarthria. She denies orthopnea.  Patient is on MTX and plaquenil for RA since 1985. She is not been on high dose steroids anytime recently.  She report any constitutional symptoms like fever, night sweats, anorexia or unintentional weight loss.  EtOH use: 1 beer per week  Restrictive diet? No Family history of neuropathy/myopathy/neurologic disease? No  Of note, patient was seen at [facility] in the past for OSA. 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   MEDICATIONS:  Outpatient Encounter Medications as of 03/30/2023  Medication Sig   amLODipine (NORVASC) 2.5 MG tablet Take 1 tablet (2.5 mg total) by mouth daily.   Ascorbic Acid (VITAMIN C) 1000 MG tablet Take 1,000 mg by mouth at bedtime.   aspirin 81 MG tablet Take 81 mg by mouth daily. 3 a week   Cholecalciferol (VITAMIN D PO) Take 5,000 Units by mouth See admin instructions. Takes 3 times a week   folic acid (FOLVITE) 1 MG tablet Take 3 mg by mouth daily.   gabapentin (NEURONTIN) 600 MG tablet TAKE 1/2 TO 1 TABLET 2 TO 3 TIMES DAILY AS NEEDED FOR PAIN   hydroxychloroquine (PLAQUENIL) 200 MG tablet Take 200 mg by mouth 2 (two) times daily.   ibuprofen (IBU) 800 MG tablet TAKE 1 TABLET 2-3 TIMES DAILY WITH FOOD FOR PAIN AND INFLAMMATION   levothyroxine (SYNTHROID) 50 MCG tablet Take  1 tablet  Daily  on an empty stomach with only water for 30 minutes & no Antacid meds, Calcium or Magnesium for 4 hours & avoid Biotin   magnesium gluconate (MAGONATE) 500 MG tablet Take 500 mg by mouth at bedtime. 3 times /week   methotrexate 50 MG/2ML injection Inject 25 mg into the skin once a week. Saturdays   olmesartan (BENICAR) 20 MG tablet TAKE 1 TABLET EVERY DAY FOR BLOOD PRESSURE   omeprazole (PRILOSEC) 40 MG capsule TAKE 1 CAPSULE TWICE DAILY TO PREVENT HEARTBURN AND INDIGESTION   traZODone (DESYREL) 150 MG tablet TAKE 1 TABLET 1 HOUR BEFORE BEDTIME AS NEEDED FOR SLEEP   zinc gluconate 50 MG tablet Take 50 mg by mouth daily.  3 times a week   No facility-administered encounter medications on file as of 03/30/2023.    PAST MEDICAL HISTORY: Past Medical History:  Diagnosis Date   Anemia    Hx of    Arthritis    Calculus of bile duct without mention of cholecystitis or obstruction    Cataract    GERD (gastroesophageal reflux disease)    H. pylori infection    Hx of    Heart murmur    Hyperlipidemia    Hypertension    Hypothyroid    PUD (peptic ulcer disease)    Rheumatoid  arthritis(714.0)     PAST SURGICAL HISTORY: Past Surgical History:  Procedure Laterality Date   AV FISTULA REPAIR     BLADDER SUSPENSION     BREAST CYST EXCISION Left    BUNIONECTOMY     CARPAL TUNNEL RELEASE     CHOLECYSTECTOMY  09/07/2011   CYSTECTOMY     left breast   ERCP  09/29/2011   Procedure: ENDOSCOPIC RETROGRADE CHOLANGIOPANCREATOGRAPHY (ERCP);  Surgeon: Louis Meckel, MD;  Location: [facility];  Service: Endoscopy;  Laterality: N/A;   PILONIDAL CYST EXCISION     TENOLYSIS  10/26/2011   Procedure: TENDON SHEATH RELEASE/TENOLYSIS;  Surgeon: Nicki Reaper, MD;  Location: [facility];  Service: Orthopedics;  Laterality: Right;  tenosynovectomy removal superficialis slip right index finger   TONSILLECTOMY     TOTAL SHOULDER REPLACEMENT Right 02/22/2014   Dr. Malka So at [facility] RELEASE     VAGINAL HYSTERECTOMY     ovaries not removed    ALLERGIES: Allergies  Allergen Reactions   Ace Inhibitors Cough   Ambien [Zolpidem] Other (See Comments)    Odd Feeling   Crestor [Rosuvastatin]    Leflunomide Other (See Comments)   Pravastatin    Prozac [Fluoxetine Hcl] Other (See Comments)    Decreased libido   Zoloft [Sertraline Hcl]     FAMILY HISTORY: Family History  Problem Relation Age of Onset   Lung cancer Mother    Stomach cancer Father    Heart attack Father        MI at age 68   Colon cancer Maternal Grandfather    Heart attack Brother        MI at age 79   Prostate cancer Paternal Grandfather    Breast cancer Paternal Aunt     SOCIAL HISTORY: Social History   Tobacco Use   Smoking status: Former    Types: Cigarettes    Quit date: 10/25/1995    Years since quitting: 27.4   Smokeless tobacco: Never   Tobacco comments:    Johnnye Lana [phone]  Vaping Use   Vaping Use: Never used  Substance Use Topics   Alcohol use: Yes    Alcohol/week: 0.0 standard drinks of alcohol    Comment: occasional   Drug use: No   Social History    Social History Narrative   Are you right handed or left handed? right   Are you currently employed ? no   What is your current occupation?retired   Do you live at home alone?no   Who lives with you? husband   What type of home do you live in: 1 story or 2 story? 1 story-         OBJECTIVE: PHYSICAL EXAM: BP 130/82   Pulse 65   Resp 16   Ht 5\' 1"  (1.549 m)   Wt 137 lb 6.4 oz (62.3 kg)  SpO2 99%   BMI 25.96 kg/m   General: General appearance: Awake and alert. No distress. Cooperative with exam.  Skin: No obvious rash or jaundice. HEENT: Atraumatic. Anicteric. Lungs: Non-labored breathing on room air  Extremities: No edema. Arthritic deformities in bilateral hands. Psych: Affect appropriate.  Neurological: Mental Status: Alert. Speech fluent. No pseudobulbar affect Cranial Nerves: CNII: No RAPD. Visual fields grossly intact. CNIII, IV, VI: PERRL. No nystagmus. EOMI. CN V: Facial sensation intact bilaterally to fine touch. CN VII: Facial muscles symmetric and strong. No ptosis at rest. CN VIII: Hearing grossly intact bilaterally. CN IX: No hypophonia. CN X: Palate elevates symmetrically. CN XI: Full strength shoulder shrug bilaterally. CN XII: Tongue protrusion full and midline. No atrophy or fasciculations. No significant dysarthria Motor: Tone is normal.  Individual muscle group testing (MRC grade out of 5):  Movement     Neck flexion 5    Neck extension 5     Right Left   Shoulder abduction 5 5   Shoulder adduction 5 5   Elbow flexion 5 5   Elbow extension 5 5   Finger abduction - FDI 5 5   Finger abduction - ADM 5 5   Finger extension 5 5   Finger distal flexion - 2/3 5 5    Finger distal flexion - 4/5 5 5    Thumb flexion - FPL 5- 5-   Thumb abduction - APB 4+ 4+    Hip flexion 4+ 4+   Hip extension 5 5   Hip adduction 5 5   Hip abduction 5 5   Knee extension 5 5   Knee flexion 5 5   Dorsiflexion 5 5   Plantarflexion 5 5   Inversion 5 5    Eversion 5 5     Reflexes:  Right Left   Bicep 2+ 2+   Tricep 2+ 2+   BrRad 2+ 2+   Knee 2+ 2+   Ankle 1+ 2+    Pathological Reflexes: Babinski: flexor response bilaterally Hoffman: absent bilaterally Sensation: Pinprick: Patchy diminished sensation in lower extremities (R > L) Proprioception: Intact in bilateral great toes Coordination: Intact finger-to- nose-finger bilaterally. Gait: Unable to rise from chair with arms crossed unassisted. Antalgic, wide-based gait  Lab and Test Review: Internal labs: 01/20/23: Vit D wnl HbA1c: 5.2 TSH: 0.29 CMP unremarkable CBC w/ diff unremarkable  B12 (04/13/22): 351 CK (11/26/19): 206  Imaging: MRI lumbar spine (03/07/23): FINDINGS: Segmentation: Standard.  Alignment: Unchanged levoscoliosis. Unchanged trace retrolisthesis at L2-L3 and L4-L5. Unchanged trace anterolisthesis at L5-S1.  Vertebrae: No fracture, evidence of discitis, or bone lesion.  Conus medullaris and cauda equina: Conus extends to the L1-L2 level. Conus and cauda equina appear normal.  Paraspinal and other soft tissues: Negative.  Disc levels:  T10-T11: Unchanged mild disc bulging and endplate spurring asymmetric to the left. Unchanged left-greater-than-right facet arthropathy. Unchanged moderate to severe left and mild right neuroforaminal stenosis. No spinal canal stenosis.  T11-T12: Unchanged mild disc bulging and bilateral facet arthropathy. No stenosis.  T12-L1: Unchanged tiny central disc protrusion. No stenosis.  L1-L2: Negative disc. Unchanged mild bilateral facet arthropathy. No stenosis.  L2-L3: Unchanged circumferential disc osteophyte complex eccentric to the right. Unchanged mild bilateral facet arthropathy. Unchanged mild spinal canal and right-greater-than-left lateral recess stenosis. Unchanged moderate right and mild left neuroforaminal stenosis.  L3-L4: Unchanged mild disc bulging and endplate spurring eccentric to the right.  Unchanged mild-to-moderate bilateral facet arthropathy. Unchanged mild spinal canal and moderate right lateral recess stenosis. Unchanged  moderate to severe right neuroforaminal stenosis. No left neuroforaminal stenosis.  L4-L5: Unchanged circumferential disc osteophyte complex with severe left and mild right facet arthropathy. Unchanged moderate to severe spinal canal stenosis and moderate bilateral lateral recess stenosis. Unchanged moderate to severe left and mild right neuroforaminal stenosis.  L5-S1: Unchanged disc uncovering and mild disc bulging. Unchanged severe bilateral facet arthropathy. Unchanged mild bilateral lateral recess stenosis. Unchanged mild-to-moderate left neuroforaminal stenosis. No spinal canal or right neuroforaminal stenosis.  IMPRESSION: 1. Multilevel degenerative changes of the lumbar spine as described above, overall similar to prior study. Unchanged moderate to severe spinal canal and left neuroforaminal stenosis at L4-L5. 2. Unchanged mild spinal canal and moderate right lateral recess stenosis at L3-L4 with moderate to severe right neuroforaminal stenosis. 3. Unchanged mild spinal canal and moderate right neuroforaminal stenosis at L2-L3.   EEG (12/03/19): FINDINGS: Posterior dominant background rhythms, which attenuate with eye opening, ranging 10-11 hertz and 15-20 microvolts. No focal, lateralizing, epileptiform activity or seizures are seen. Patient recorded in the awake and drowsy states. EKG channel shows regular rhythm of 75-80 beats per minute.     IMPRESSION:    Normal EEG in the awake and drowsy states.   EMG (01/03/20): Summary: EMG/NCS was performed on the lower extremities.  The right superficial peroneal sensory nerve showed decreased amplitude (3 V, normal greater than 6). The left superficial peroneal sensory nerve showed decreased amplitude (2 V, normal greater than 6). All remaining nerves (as indicated in the following tables)  were within normal limits. All muscles (as indicated in the following tables) were within normal limits.     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   MRI brain w/wo contrast (01/18/20): FINDINGS: On sagittal images, the spinal cord is imaged caudally to C4 and is normal in caliber.   Degenerative changes are noted in the cervical spine at C3-C4.  The contents of the posterior fossa are of normal size and position.   The pituitary gland and optic chiasm appear normal.    There is mild generalized cortical atrophy most progressed in the posterior frontal lobe and parietal lobe, that has progressed compared to the 2012 MRI.Marland Kitchen  There are no abnormal extra-axial collections of fluid.     The cerebellum and brainstem appears normal.   The deep gray matter appears normal.  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  This has progressed compared to the 2012 MRI.  Diffusion weighted images are normal.  Susceptibility weighted images are normal.      The orbits appear normal for age.  Incidental note is made of bilateral pseudophakia.  The VIIth/VIIIth nerve complex appears normal.  The mastoid air cells appear normal.  The paranasal sinuses appear normal.  Flow voids are identified within the major intracerebral arteries.     After the infusion of contrast material, a normal enhancement pattern is noted.   IMPRESSION: This MRI of the brain with and without contrast shows the following: 1.   Mild generalized cortical atrophy that has progressed compared to the 2012 MRI. 2.   Mild chronic microvascular ischemic changes, progressed compared to the 2012 MRI. 3.   Degenerative changes at C3-C4, possibly causing spinal stenosis 4.   There are no acute findings and there is a normal enhancement  pattern.  ASSESSMENT: [name] is a 77 y.o. female who presents  for evaluation of leg weakness and pain. She has a relevant medical history of HTN, HLD, RA, OA, pre-diabetes, hypothyroidism, and vit D deficiency. Her neurological examination is pertinent for bilateral hip flexor weakness and more mild distal upper extremity weakness. Available diagnostic data is significant for MRI lumbar spine showing moderate to severe spinal stenosis. Patient received injections in her lumbar spine yesterday (03/29/23) with great improvement in pain and ability to ambulate. This argues for lumbosacral radiculopathy as the cause of patient's symptoms. There could also be concern for a myopathic process given the proximal muscle weakness in the legs if she does not continue to improve or worsens. She has been on long term plaquenil for RA would increases risk of myopathy. Distal upper extremity weakness may be due to arthritis, but with proximal lower extremity weakness, IBM could also be considered. We discussed EMG, but given that patient feels great improvement after the spinal injection, it is reasonable to monitor symptoms for now, which is what patient prefers.  PLAN: -Blood work: B12, CK, aldolase -EMG discussed, patient will defer for now due to improvement  -Return to clinic as needed  The impression above as well as the plan as outlined below were extensively discussed with the patient (in the company of husband) who voiced understanding. All questions were answered to their satisfaction.  The patient was counseled on pertinent fall precautions per the printed material provided today, and as noted under the "Patient Instructions" section below.  When available, results of the above investigations and possible further recommendations will be communicated to the patient via telephone/MyChart. Patient to call office if not contacted after expected testing turnaround time.   Total time spent  reviewing records, interview, history/exam, documentation, and coordination of care on day of encounter:  55 min   Thank you for allowing me to participate in patient's care.  If I can answer any additional questions, I would be pleased to do so.  Jacquelyne Balint, MD   CC: Lucky Cowboy, MD [address] [place] [address]  CC: Referring provider: [name], MD [address] [place],  [place] [address]

## 2023-03-28 DIAGNOSIS — E039 Hypothyroidism, unspecified: Secondary | ICD-10-CM | POA: Diagnosis not present

## 2023-03-29 DIAGNOSIS — M48062 Spinal stenosis, lumbar region with neurogenic claudication: Secondary | ICD-10-CM | POA: Diagnosis not present

## 2023-03-30 ENCOUNTER — Other Ambulatory Visit (INDEPENDENT_AMBULATORY_CARE_PROVIDER_SITE_OTHER): Payer: Medicare PPO

## 2023-03-30 ENCOUNTER — Ambulatory Visit: Payer: Medicare PPO | Admitting: Neurology

## 2023-03-30 ENCOUNTER — Encounter: Payer: Self-pay | Admitting: Neurology

## 2023-03-30 VITALS — BP 130/82 | HR 65 | Resp 16 | Ht 61.0 in | Wt 137.4 lb

## 2023-03-30 DIAGNOSIS — M25561 Pain in right knee: Secondary | ICD-10-CM

## 2023-03-30 DIAGNOSIS — M545 Low back pain, unspecified: Secondary | ICD-10-CM

## 2023-03-30 DIAGNOSIS — M25562 Pain in left knee: Secondary | ICD-10-CM

## 2023-03-30 DIAGNOSIS — R29898 Other symptoms and signs involving the musculoskeletal system: Secondary | ICD-10-CM | POA: Diagnosis not present

## 2023-03-30 NOTE — Patient Instructions (Signed)
I saw you today for leg weakness. You seem to have improved after the injection in your back. Certainly, pinched nerves in your back could cause your symptoms. We discussed that leg weakness is also sometimes a muscle problem. I would like to do lab work today to look into this further. I will be in touch when I have your results.  If you do not continue to improve or have worsening symptoms, please let me know as we may need to consider repeating your muscle and nerve test done in 2021 (called an EMG).   Follow up with me as needed.  The physicians and staff at [facility] are committed to providing excellent care. You may receive a survey requesting feedback about your experience at our office. We strive to receive "very good" responses to the survey questions. If you feel that your experience would prevent you from giving the office a "very good " response, please contact our office to try to remedy the situation. We may be reached at [phone]. Thank you for taking the time out of your busy day to complete the survey.  Jacquelyne Balint, MD [facility]  Preventing Falls at [facility] are common, often dreaded events in the lives of older people. Aside from the obvious injuries and even death that may result, fall can cause wide-ranging consequences including loss of independence, mental decline, decreased activity and mobility. Younger people are also at risk of falling, especially those with chronic illnesses and fatigue.  Ways to reduce risk for falling Examine diet and medications. Warm foods and alcohol dilate blood vessels, which can lead to dizziness when standing. Sleep aids, antidepressants and pain medications can also increase the likelihood of a fall.  Get a vision exam. Poor vision, cataracts and glaucoma increase the chances of falling.  Check foot gear. Shoes should fit snugly and have a sturdy, nonskid sole and a broad, low heel  Participate in a physician-approved  exercise program to build and maintain muscle strength and improve balance and coordination. Programs that use ankle weights or stretch bands are excellent for muscle-strengthening. Water aerobics programs and low-impact Tai Chi programs have also been shown to improve balance and coordination.  Increase vitamin D intake. Vitamin D improves muscle strength and increases the amount of calcium the body is able to absorb and deposit in bones.  How to prevent falls from common hazards Floors - Remove all loose wires, cords, and throw rugs. Minimize clutter. Make sure rugs are anchored and smooth. Keep furniture in its usual place.  Chairs -- Use chairs with straight backs, armrests and firm seats. Add firm cushions to existing pieces to add height.  Bathroom - Install grab bars and non-skid tape in the tub or shower. Use a bathtub transfer bench or a shower chair with a back support Use an elevated toilet seat and/or safety rails to assist standing from a low surface. Do not use towel racks or bathroom tissue holders to help you stand.  Lighting - Make sure halls, stairways, and entrances are well-lit. Install a night light in your bathroom or hallway. Make sure there is a light switch at the top and bottom of the staircase. Turn lights on if you get up in the middle of the night. Make sure lamps or light switches are within reach of the bed if you have to get up during the night.  Kitchen - Install non-skid rubber mats near the sink and stove. Clean spills immediately. Store frequently used utensils,  pots, pans between waist and eye level. This helps prevent reaching and bending. Sit when getting things out of lower cupboards.  Living room/ Bedrooms - Place furniture with wide spaces in between, giving enough room to move around. Establish a route through the living room that gives you something to hold onto as you walk.  Stairs - Make sure treads, rails, and rugs are secure. Install a rail on both  sides of the stairs. If stairs are a threat, it might be helpful to arrange most of your activities on the lower level to reduce the number of times you must climb the stairs.  Entrances and doorways - Install metal handles on the walls adjacent to the doorknobs of all doors to make it more secure as you travel through the doorway.  Tips for maintaining balance Keep at least one hand free at all times. Try using a backpack or fanny pack to hold things rather than carrying them in your hands. Never carry objects in both hands when walking as this interferes with keeping your balance.  Attempt to swing both arms from front to back while walking. This might require a conscious effort if Parkinson's disease has diminished your movement. It will, however, help you to maintain balance and posture, and reduce fatigue.  Consciously lift your feet off of the ground when walking. Shuffling and dragging of the feet is a common culprit in losing your balance.  When trying to navigate turns, use a "U" technique of facing forward and making a wide turn, rather than pivoting sharply.  Try to stand with your feet shoulder-length apart. When your feet are close together for any length of time, you increase your risk of losing your balance and falling.  Do one thing at a time. Don't try to walk and accomplish another task, such as reading or looking around. The decrease in your automatic reflexes complicates motor function, so the less distraction, the better.  Do not wear rubber or gripping soled shoes, they might "catch" on the floor and cause tripping.  Move slowly when changing positions. Use deliberate, concentrated movements and, if needed, use a grab bar or walking aid. Count 15 seconds between each movement. For example, when rising from a seated position, wait 15 seconds after standing to begin walking.  If balance is a continuous problem, you might want to consider a walking aid such as a cane, walking  stick, or walker. Once you've mastered walking with help, you might be ready to try it on your own again.

## 2023-03-31 LAB — CK: Total CK: 105 U/L (ref 7–177)

## 2023-03-31 LAB — VITAMIN B12: Vitamin B-12: 1265 pg/mL — ABNORMAL HIGH (ref 211–911)

## 2023-04-01 LAB — ALDOLASE: Aldolase: 4.7 U/L (ref <=8.1)

## 2023-04-14 DIAGNOSIS — Z79899 Other long term (current) drug therapy: Secondary | ICD-10-CM | POA: Diagnosis not present

## 2023-04-14 DIAGNOSIS — K760 Fatty (change of) liver, not elsewhere classified: Secondary | ICD-10-CM | POA: Diagnosis not present

## 2023-04-14 DIAGNOSIS — M48061 Spinal stenosis, lumbar region without neurogenic claudication: Secondary | ICD-10-CM | POA: Diagnosis not present

## 2023-04-14 DIAGNOSIS — M0609 Rheumatoid arthritis without rheumatoid factor, multiple sites: Secondary | ICD-10-CM | POA: Diagnosis not present

## 2023-04-14 DIAGNOSIS — M1991 Primary osteoarthritis, unspecified site: Secondary | ICD-10-CM | POA: Diagnosis not present

## 2023-04-14 DIAGNOSIS — Z6825 Body mass index (BMI) 25.0-25.9, adult: Secondary | ICD-10-CM | POA: Diagnosis not present

## 2023-04-14 DIAGNOSIS — E663 Overweight: Secondary | ICD-10-CM | POA: Diagnosis not present

## 2023-04-22 ENCOUNTER — Ambulatory Visit: Payer: Medicare PPO | Admitting: Nurse Practitioner

## 2023-04-25 DIAGNOSIS — M5416 Radiculopathy, lumbar region: Secondary | ICD-10-CM | POA: Diagnosis not present

## 2023-04-25 NOTE — Progress Notes (Deleted)
MEDICARE ANNUAL WELLNESS AND FOLLOW UP 3 MONTH  Assessment & Plan:   Encounter for medicare annual wellness visit Due yearly  Essential hypertension Continue current medications: Olmesartan 40mg  daily , Rx Amlodapine 2.5mg  night.  Check blood pressure BID & record.  Follow up in two weeks with log. Monitor blood pressure at home; call if consistently over 130/80 Continue DASH diet.   Reminder to go to the ER if any CP, SOB, nausea, dizziness, severe HA, changes vision/speech, left arm numbness and tingling and jaw pain. -     CBC with Differential/Platelet -     COMPLETE METABOLIC PANEL WITH GFR -     TSH - continue medications, DASH diet, exercise and monitor at home. Call if greater than 130/80.   Hyperlipidemia, mixed -     Lipid panel check lipids decrease fatty foods increase activity.   Hypothyroidism, unspecified type Taking levothyroxine 112 mcg 1tab daily except Mon & Clov[place] Cao takes 1/2 pill Reminder to take on an empty stomach 30-6mins before first meal of the day. No antacid medications for 4 hours. -     TSH  Abnormal glucose Discussed disease progression and risks Discussed diet/exercise, weight management and risk modification  Vitamin D deficiency Continue Vit D supplementation to maintain value in therapeutic level of 60-100   Hyperparathyroidism (HCC) - PTH  Aortic atherosclerosis (HCC) Control blood pressure, cholesterol, glucose, increase exercise.   Rheumatoid arthritis involving multiple sites, unspecified whether rheumatoid factor present (HCC) Continue follow up  Depression, major, single episode, in partial remission (HCC) Doing well at this time, continue to monitor No medications:  Discussed stress management techniques  Discussed, increase water,intake & good sleep hygiene  Discussed increasing exercise & vegetables in diet  GERD Doing well at this time Continue: omeprazole 40mg  BID PRN Diet discussed Monitor for triggers Avoid food with  high acid content Avoid excessive cafeine Increase water intake  Medication management Continued   Osteoporosis Encouraged weight bearing exercises Stop Fosamax for 1 year Continue Vit D supplementation   Plan:   During the course of the visit the patient was educated and counseled about appropriate screening and preventive services including:   Pneumococcal vaccine  Prevnar 13 Influenza vaccine Td vaccine Screening electrocardiogram Bone densitometry screening Colorectal cancer screening Diabetes screening Glaucoma screening Nutrition counseling  Advanced directives: requested    Subjective:    Patient ID: [name], female    DOB: August 16, 1946, 77 y.o.   MRN: [id number]  77 y.o. former smoking (Q96) WF with history of HTN, HLD,history of pre-diabetes (abnormal glucose), overweight, vitamin D def, RA on MTX and plaquenil and vitamin D deficiency presents for follow up.  She started having mid right back pain that she describes at aching that worsens when moving a certain way. Advil does help a little.     Of note she had a work up for syncopal episodes/dizziness in 2014 with negative holter that showed PVC/PAC with Dr. Patty Sermons, normal stress test 2016 and normal MRI in 2012. She was taken off her HCTZ at that time. She was recently sent to see neurology, had a normal MRI brain, normal EEG and EMG showed neuropathy.  Her last fall was Jan 2021.   Stress test 2016 Dr. Jens Som Nuclear stress EF: 64%. The study is normal. This is a low risk study. The left ventricular ejection fraction is normal (55-65%). Never had CXR   Colonoscopy and EGD in 2014  She is on gabapentin 100mg  1 in AM and 2 at night,  xanax, losartan 25mg  and off biotin x 1 month.  She sees Dr. Nickola Major regularly and states that her RA is well controlled on plaquenil and Methotrexate  She is on fosamax for osteoporosis, stopped and restarted fall/2018. Will stop for a year and then  restart  she has a diagnosis of insomnia and is currently on melatonin 20mg , advil pm and takes 0.5 mg of xanax (has failed multiple agents in the past including trazodone), reports symptoms are well controlled on current regimen.  she has a diagnosis of GERD with esophagitis and hx of PUD/gastric ulcer for which she continues management by prilosec 40 mg. She will have soft stools, like soft serve, will also have fecal incontinence. Has the neuropathy. Will suggest she follow up with ortho, will get Xray lumbar, and suggest getting on fiber supplement to bulk stool and to follow up with GI. Colonoscopy was 2015.   BMI is There is no height or weight on file to calculate BMI., she is working on diet and exercise. Wt Readings from Last 3 Encounters:  03/30/23 137 lb 6.4 oz (62.3 kg)  01/20/23 141 lb (64 kg)  10/21/22 139 lb 9.6 oz (63.3 kg)   Her blood pressure is checked at home. Has been taking 50 mg losartan.   BP Readings from Last 3 Encounters:  03/30/23 130/82  01/20/23 120/64  10/21/22 130/68   She does not workout.  She denies chest pain, shortness of breath, dizziness.   She is not on cholesterol medication and denies myalgias. Her cholesterol is at goal. The cholesterol last visit was:   Lab Results  Component Value Date   CHOL 167 01/20/2023   HDL 72 01/20/2023   LDLCALC 79 01/20/2023   TRIG 77 01/20/2023   CHOLHDL 2.3 01/20/2023   Lab Results  Component Value Date   HGBA1C 5.2 01/20/2023   Patient is on Vitamin D supplement. Lab Results  Component Value Date   VD25OH 22 01/20/2023   She is on thyroid medication. Her medication was NOT changed last visit, , 1 pill daily.  Patient denies nervousness, palpitations and weight changes. She is on biotin.  Lab Results  Component Value Date   TSH 0.29 (L) 01/20/2023      Medications  Current Outpatient Medications (Endocrine & Metabolic):    levothyroxine (SYNTHROID) 50 MCG tablet, Take  1 tablet  Daily  on  an empty stomach with only water for 30 minutes & no Antacid meds, Calcium or Magnesium for 4 hours & avoid Biotin  Current Outpatient Medications (Cardiovascular):    amLODipine (NORVASC) 2.5 MG tablet, Take 1 tablet (2.5 mg total) by mouth daily.   olmesartan (BENICAR) 20 MG tablet, TAKE 1 TABLET EVERY DAY FOR BLOOD PRESSURE   Current Outpatient Medications (Analgesics):    aspirin 81 MG tablet, Take 81 mg by mouth daily. 3 a week   ibuprofen (IBU) 800 MG tablet, TAKE 1 TABLET 2-3 TIMES DAILY WITH FOOD FOR PAIN AND INFLAMMATION  Current Outpatient Medications (Hematological):    folic acid (FOLVITE) 1 MG tablet, Take 3 mg by mouth daily.  Current Outpatient Medications (Other):    Ascorbic Acid (VITAMIN C) 1000 MG tablet, Take 1,000 mg by mouth at bedtime.   Cholecalciferol (VITAMIN D PO), Take 5,000 Units by mouth See admin instructions. Takes 3 times a week   gabapentin (NEURONTIN) 600 MG tablet, TAKE 1/2 TO 1 TABLET 2 TO 3 TIMES DAILY AS NEEDED FOR PAIN   hydroxychloroquine (PLAQUENIL) 200 MG tablet, Take 200  mg by mouth 2 (two) times daily.   magnesium gluconate (MAGONATE) 500 MG tablet, Take 500 mg by mouth at bedtime. 3 times /week   methotrexate 50 MG/2ML injection, Inject 25 mg into the skin once a week. Saturdays   omeprazole (PRILOSEC) 40 MG capsule, TAKE 1 CAPSULE TWICE DAILY TO PREVENT HEARTBURN AND INDIGESTION   traZODone (DESYREL) 150 MG tablet, TAKE 1 TABLET 1 HOUR BEFORE BEDTIME AS NEEDED FOR SLEEP   zinc gluconate 50 MG tablet, Take 50 mg by mouth daily. 3 times a week  Problem list She has Essential hypertension; Rheumatoid arthritis (HCC); Hyperlipidemia, mixed; Gastroesophageal reflux disease without esophagitis; Personal history of colonic polyps; Vitamin D deficiency; Abnormal glucose; Medication management; Hypothyroidism; Osteoporosis; S/P shoulder replacement, right; Hyperparathyroidism (HCC); Overweight (BMI 25.0-29.9); Insomnia; Neuropathy; Aortic  atherosclerosis (HCC) by CXR in 2021; Depression, major, single episode, in partial remission (HCC); COVID-19; Hyponatremia; COVID-19 virus infection; Staphylococcus epidermidis bacteremia; and Statin myopathy on their problem list.  Allergies Allergies  Allergen Reactions   Ace Inhibitors Cough   Ambien [Zolpidem] Other (See Comments)    Odd Feeling   Crestor [Rosuvastatin]    Leflunomide Other (See Comments)   Pravastatin    Prozac [Fluoxetine Hcl] Other (See Comments)    Decreased libido   Zoloft [Sertraline Hcl]     SURGICAL HISTORY She  has a past surgical history that includes Vaginal hysterectomy; Carpal tunnel release; Cystectomy; AV fistula repair; Bladder suspension; Pilonidal cyst excision; Trigger finger release; Bunionectomy; ERCP (09/29/2011); Tonsillectomy; Cholecystectomy (09/07/2011); Tenolysis (10/26/2011); Total shoulder replacement (Right, 02/22/2014); and Breast cyst excision (Left). FAMILY HISTORY Her family history includes Breast cancer in her paternal aunt; Colon cancer in her maternal grandfather; Heart attack in her brother and father; Lung cancer in her mother; Prostate cancer in her paternal grandfather; Stomach cancer in her father. SOCIAL HISTORY She  reports that she quit smoking about 27 years ago. Her smoking use included cigarettes. She has never used smokeless tobacco. She reports current alcohol use. She reports that she does not use drugs.  MEDICARE WELLNESS OBJECTIVES: Physical activity:   Cardiac risk factors:   Depression/mood screen:      07/21/2022    9:49 PM  Depression screen PHQ 2/9  Decreased Interest 0  Down, Depressed, Hopeless 0  PHQ - 2 Score 0    ADLs:     07/21/2022    9:50 PM  In your present state of health, do you have any difficulty performing the following activities:  Hearing? 0  Vision? 0  Difficulty concentrating or making decisions? 0  Walking or climbing stairs? 0  Dressing or bathing? 0  Doing errands,  shopping? 0     Cognitive Testing  Alert? Yes  Normal Appearance?Yes  Oriented to person? Yes  Place? Yes   Time? Yes  Recall of three objects?  Yes  Can perform simple calculations? Yes  Displays appropriate judgment?Yes  Can read the correct time from a watch face?Yes  EOL planning:       Review of Systems  Constitutional:  Negative for chills, diaphoresis, fever, malaise/fatigue and weight loss.  HENT:  Negative for congestion, ear discharge, ear pain, hearing loss, nosebleeds, sinus pain, sore throat and tinnitus.   Eyes:  Negative for blurred vision, double vision, photophobia, pain, discharge and redness.  Respiratory:  Negative for cough, hemoptysis, sputum production, shortness of breath, wheezing and stridor.   Cardiovascular:  Negative for chest pain, palpitations, orthopnea, claudication, leg swelling and PND.  Gastrointestinal:  Negative for  abdominal pain, blood in stool, constipation, diarrhea, heartburn, melena, nausea and vomiting.  Genitourinary:  Negative for dysuria, flank pain, frequency, hematuria and urgency.  Musculoskeletal:  Positive for back pain and joint pain. Negative for falls, myalgias and neck pain.  Skin:  Negative for itching and rash.  Neurological:  Negative for dizziness, tingling, tremors, sensory change, speech change, focal weakness, seizures, loss of consciousness, weakness and headaches.  Endo/Heme/Allergies:  Negative for environmental allergies and polydipsia. Does not bruise/bleed easily.  Psychiatric/Behavioral:  Negative for depression, hallucinations, memory loss, substance abuse and suicidal ideas. The patient is not nervous/anxious and does not have insomnia.         Objective:   Physical Exam General appearance: alert, no distress, WD/WN,  female HEENT: normocephalic, sclerae anicteric, TMs pearly, nares patent, no discharge or erythema, pharynx normal Oral cavity: MMM, no lesions Neck: supple, no lymphadenopathy, no  thyromegaly, no masses Heart: RRR, normal S1, S2, 1/6 systolic murmur RSB Lungs: CTA bilaterally, no wheezes, rhonchi, or rales Abdomen: +bs, soft, nontender, non distended, no masses, no hepatomegaly, no splenomegaly Musculoskeletal:  no swelling, no obvious deformity. Muscle spasm noted right thoracic Extremities: no edema, no cyanosis, no clubbing Pulses: 2+ symmetric, upper and lower extremities, normal cap refill Neurological: alert, oriented x 3, CN2-12 intact, tremor right thumb at rest, strength normal upper extremities and lower extremities, good finger to nose, DTRs 2+ throughout, decreased sensation bilateral feet and hands, + romberg, gait normal Psychiatric: normal affect, behavior normal, pleasant  Breast: defer Gyn: defer Rectal: defer    Medicare Attestation I have personally reviewed: The patient's medical and social history Their use of alcohol, tobacco or illicit drugs Their current medications and supplements The patient's functional ability including ADLs,fall risks, home safety risks, cognitive, and hearing and visual impairment Diet and physical activities Evidence for depression or mood disorders  The patient's weight, height, BMI, and visual acuity have been recorded in the chart.  I have made referrals, counseling, and provided education to the patient based on review of the above and I have provided the patient with a written personalized care plan for preventive services.      Raynelle Dick, NP 04/25/23

## 2023-04-26 ENCOUNTER — Ambulatory Visit: Payer: Medicare PPO | Admitting: Nurse Practitioner

## 2023-04-26 DIAGNOSIS — M069 Rheumatoid arthritis, unspecified: Secondary | ICD-10-CM

## 2023-04-26 DIAGNOSIS — I10 Essential (primary) hypertension: Secondary | ICD-10-CM

## 2023-04-26 DIAGNOSIS — Z79899 Other long term (current) drug therapy: Secondary | ICD-10-CM

## 2023-04-26 DIAGNOSIS — F324 Major depressive disorder, single episode, in partial remission: Secondary | ICD-10-CM

## 2023-04-26 DIAGNOSIS — R7309 Other abnormal glucose: Secondary | ICD-10-CM

## 2023-04-26 DIAGNOSIS — E213 Hyperparathyroidism, unspecified: Secondary | ICD-10-CM

## 2023-04-26 DIAGNOSIS — M810 Age-related osteoporosis without current pathological fracture: Secondary | ICD-10-CM

## 2023-04-26 DIAGNOSIS — E559 Vitamin D deficiency, unspecified: Secondary | ICD-10-CM

## 2023-04-26 DIAGNOSIS — K219 Gastro-esophageal reflux disease without esophagitis: Secondary | ICD-10-CM

## 2023-04-26 DIAGNOSIS — E782 Mixed hyperlipidemia: Secondary | ICD-10-CM

## 2023-04-26 DIAGNOSIS — Z0000 Encounter for general adult medical examination without abnormal findings: Secondary | ICD-10-CM

## 2023-04-26 DIAGNOSIS — I700 Atherosclerosis of aorta: Secondary | ICD-10-CM

## 2023-04-26 DIAGNOSIS — E039 Hypothyroidism, unspecified: Secondary | ICD-10-CM

## 2023-05-05 ENCOUNTER — Ambulatory Visit: Payer: Medicare PPO | Admitting: Nurse Practitioner

## 2023-05-10 DIAGNOSIS — M5416 Radiculopathy, lumbar region: Secondary | ICD-10-CM | POA: Diagnosis not present

## 2023-06-01 DIAGNOSIS — E039 Hypothyroidism, unspecified: Secondary | ICD-10-CM | POA: Diagnosis not present

## 2023-06-06 DIAGNOSIS — M48062 Spinal stenosis, lumbar region with neurogenic claudication: Secondary | ICD-10-CM | POA: Diagnosis not present

## 2023-06-09 DIAGNOSIS — M25561 Pain in right knee: Secondary | ICD-10-CM | POA: Diagnosis not present

## 2023-06-09 DIAGNOSIS — M25562 Pain in left knee: Secondary | ICD-10-CM | POA: Diagnosis not present

## 2023-06-17 DIAGNOSIS — M25561 Pain in right knee: Secondary | ICD-10-CM | POA: Diagnosis not present

## 2023-06-17 DIAGNOSIS — M25562 Pain in left knee: Secondary | ICD-10-CM | POA: Diagnosis not present

## 2023-06-20 DIAGNOSIS — Z79899 Other long term (current) drug therapy: Secondary | ICD-10-CM | POA: Diagnosis not present

## 2023-06-28 DIAGNOSIS — M25562 Pain in left knee: Secondary | ICD-10-CM | POA: Diagnosis not present

## 2023-06-28 DIAGNOSIS — M25561 Pain in right knee: Secondary | ICD-10-CM | POA: Diagnosis not present

## 2023-07-05 NOTE — Progress Notes (Unsigned)
FOLLOW UP 3 MONTH AND SURGICAL CLEARANCE  Assessment & Plan:   Preop Exam for Internal Medicine She is to have Left TKA no date has been set- surgeon is Dr. Luiz Blare -     CBC with Differential/Platelet -     COMPLETE METABOLIC PANEL WITH GFR -     Lipid panel -     TSH -     EKG 12-Lead -     Urinalysis, Routine w reflex microscopic -     Protime-INR -     DG Chest 2 View; Future  Essential hypertension Continue current medications: Olmesartan 40mg  daily  Has been off amlodipine x 1 month and BP is stable will continue to keep Amlodipine d/c'd and  Monitor blood pressure at home; call if consistently over 130/80 Continue DASH diet.   Reminder to go to the ER if any CP, SOB, nausea, dizziness, severe HA, changes vision/speech, left arm numbness and tingling and jaw pain. -     CBC with Differential/Platelet -     COMPLETE METABOLIC PANEL WITH GFR -     TSH - continue medications, DASH diet, exercise and monitor at home. Call if greater than 130/80.   Hyperlipidemia, mixed -     Lipid panel check lipids decrease fatty foods increase activity.   Hypothyroidism, unspecified type Taking levothyroxine 112 mcg 1 tab daily Reminder to take on an empty stomach 30-57mins before first meal of the day. No antacid medications for 4 hours. -     TSH  Abnormal glucose Discussed disease progression and risks Discussed diet/exercise, weight management and risk modification - CMP  Vitamin D deficiency Continue Vit D supplementation to maintain value in therapeutic level of 60-100   Overweight Long discussion about weight loss, diet, and exercise Recommended diet heavy in fruits and veggies and low in animal meats, cheeses, and dairy products, appropriate calorie intake Patient will work on decreasing saturated fats, simple carbs and increasing activity Follow up at next visit  Hyperparathyroidism (HCC) Monitor  Aortic atherosclerosis (HCC) Control blood pressure, cholesterol,  glucose, increase exercise.  - PT/INR  Rheumatoid arthritis involving multiple sites, unspecified whether rheumatoid factor present (HCC) Continue follow up  Elevated LFT's Avoid alcohol and Tylenol -CMP  Depression, major, single episode, in partial remission (HCC) Doing well at this time, continue to monitor No medications:  Discussed stress management techniques  Discussed, increase water,intake & good sleep hygiene  Discussed increasing exercise & vegetables in diet  GERD Doing well at this time Continue: omeprazole 40mg  BID PRN Diet discussed Monitor for triggers Avoid food with high acid content Avoid excessive cafeine Increase water intake  Medication management Continued  Medication management -     Protime-INR  Chronic pain of left knee/right knee Continue to follow with orthopedics Has upcoming L TKA and will then have R TKA  Former smoker -     DG Chest 2 View; Future    Future Appointments  Date Time Provider Department Center  07/25/2023 10:00 AM Lucky Cowboy, MD [facility] None     Subjective:    Patient ID: [name], female    DOB: 1946-10-12, 77 y.o.   MRN: [id number]   77 y.o. former smoking (Q96) WF with history of HTN, HLD,history of pre-diabetes (abnormal glucose), overweight, vitamin D def, RA on MTX and plaquenil and vitamin D deficiency presents for follow up.  She is on gabapentin 100mg  1 in AM and 2 at night.Neuropathy of feet. She sees Dr. Nickola Major regularly and states  that her RA is well controlled on plaquenil and Methotrexate. Last visit 10/14/22  she has a diagnosis of insomnia, she has trazodone 150 mg to take, does not take every night. Gabapentin does help.    she has a diagnosis of GERD with esophagitis and hx of PUD/gastric ulcer for which she continues management by prilosec 40 mg as needed. . Has neuropathy.  Colonoscopy was 2015.   BMI is Body mass index is 27.02 kg/m., she is working on diet and exercise.She  has not been exercising. She does eat lots of fresh fruits and vegetables, trying to increase water Wt Readings from Last 3 Encounters:  07/06/23 143 lb (64.9 kg)  03/30/23 137 lb 6.4 oz (62.3 kg)  01/20/23 76 lb (64 kg)   Her blood pressure is checked at home. She is asymptomatic.  She is taking olmesartan 20mg  daily . Has not taken Amlodipine 2.5 mg x 1 month BP Readings from Last 3 Encounters:  07/06/23 128/78  03/30/23 130/82  01/20/23 120/64  She does not workout.  She denies chest pain, shortness of breath, dizziness.    She is not on cholesterol medication and denies myalgias. Her cholesterol is at goal. The cholesterol last visit was:   Lab Results  Component Value Date   CHOL 167 01/20/2023   HDL 72 01/20/2023   LDLCALC 79 01/20/2023   TRIG 77 01/20/2023   CHOLHDL 2.3 01/20/2023   She is currently on no medication for abnormal glucose.  Controlled with diet and exercise Lab Results  Component Value Date   HGBA1C 5.2 01/20/2023   She is drinking unsweet tea and some water.  Lab Results  Component Value Date   EGFR 65 01/20/2023    Patient is on Vitamin D supplement. Lab Results  Component Value Date   VD25OH 23 01/20/2023   She is on thyroid medication, 88 mcg every day  Patient denies nervousness, palpitations and weight changes. She is on biotin. Follows with Dr. Sharl Ma Lab Results  Component Value Date   TSH 0.29 (L) 01/20/2023    Blood pressure 128/78, pulse (!) 56, temperature 97.9 F (36.6 C), height 5\' 1"  (1.549 m), weight 143 lb (64.9 kg), SpO2 99%. Medications  Current Outpatient Medications (Endocrine & Metabolic):    levothyroxine (SYNTHROID) 50 MCG tablet, Take  1 tablet  Daily  on an empty stomach with only water for 30 minutes & no Antacid meds, Calcium or Magnesium for 4 hours & avoid Biotin  Current Outpatient Medications (Cardiovascular):    amLODipine (NORVASC) 2.5 MG tablet, Take 1 tablet (2.5 mg total) by mouth daily.   olmesartan  (BENICAR) 20 MG tablet, TAKE 1 TABLET EVERY DAY FOR BLOOD PRESSURE   Current Outpatient Medications (Analgesics):    aspirin 81 MG tablet, Take 81 mg by mouth daily. 3 a week   ibuprofen (IBU) 800 MG tablet, TAKE 1 TABLET 2-3 TIMES DAILY WITH FOOD FOR PAIN AND INFLAMMATION  Current Outpatient Medications (Hematological):    folic acid (FOLVITE) 1 MG tablet, Take 3 mg by mouth daily.  Current Outpatient Medications (Other):    Ascorbic Acid (VITAMIN C) 1000 MG tablet, Take 1,000 mg by mouth at bedtime.   Cholecalciferol (VITAMIN D PO), Take 5,000 Units by mouth See admin instructions. Takes 3 times a week   gabapentin (NEURONTIN) 600 MG tablet, TAKE 1/2 TO 1 TABLET 2 TO 3 TIMES DAILY AS NEEDED FOR PAIN   hydroxychloroquine (PLAQUENIL) 200 MG tablet, Take 200 mg by mouth 2 (two) times  daily.   magnesium gluconate (MAGONATE) 500 MG tablet, Take 500 mg by mouth at bedtime. 3 times /week   methotrexate 50 MG/2ML injection, Inject 25 mg into the skin once a week. Saturdays   omeprazole (PRILOSEC) 40 MG capsule, TAKE 1 CAPSULE TWICE DAILY TO PREVENT HEARTBURN AND INDIGESTION   traZODone (DESYREL) 150 MG tablet, TAKE 1 TABLET 1 HOUR BEFORE BEDTIME AS NEEDED FOR SLEEP   zinc gluconate 50 MG tablet, Take 50 mg by mouth daily. 3 times a week  Problem list She has Essential hypertension; Rheumatoid arthritis (HCC); Hyperlipidemia, mixed; Gastroesophageal reflux disease without esophagitis; Personal history of colonic polyps; Vitamin D deficiency; Abnormal glucose; Medication management; Hypothyroidism; Osteoporosis; S/P shoulder replacement, right; Hyperparathyroidism (HCC); Overweight (BMI 25.0-29.9); Insomnia; Neuropathy; Aortic atherosclerosis (HCC) by CXR in 2021; Depression, major, single episode, in partial remission (HCC); COVID-19; Hyponatremia; COVID-19 virus infection; Staphylococcus epidermidis bacteremia; and Statin myopathy on their problem list.  Allergies Allergies  Allergen Reactions    Ace Inhibitors Cough   Ambien [Zolpidem] Other (See Comments)    Odd Feeling   Crestor [Rosuvastatin]    Leflunomide Other (See Comments)   Pravastatin    Prozac [Fluoxetine Hcl] Other (See Comments)    Decreased libido   Zoloft [Sertraline Hcl]     SURGICAL HISTORY She  has a past surgical history that includes Vaginal hysterectomy; Carpal tunnel release; Cystectomy; AV fistula repair; Bladder suspension; Pilonidal cyst excision; Trigger finger release; Bunionectomy; ERCP (09/29/2011); Tonsillectomy; Cholecystectomy (09/07/2011); Tenolysis (10/26/2011); Total shoulder replacement (Right, 02/22/2014); and Breast cyst excision (Left). FAMILY HISTORY Her family history includes Breast cancer in her paternal aunt; Colon cancer in her maternal grandfather; Heart attack in her brother and father; Lung cancer in her mother; Prostate cancer in her paternal grandfather; Stomach cancer in her father. SOCIAL HISTORY She  reports that she quit smoking about 27 years ago. Her smoking use included cigarettes. She has never used smokeless tobacco. She reports current alcohol use. She reports that she does not use drugs.  Review of Systems  Constitutional:  Negative for chills, fever and weight loss.  HENT:  Negative for congestion and hearing loss.   Eyes:  Negative for blurred vision and double vision.  Respiratory:  Negative for cough and shortness of breath.   Cardiovascular:  Negative for chest pain, palpitations, orthopnea and leg swelling.  Gastrointestinal:  Positive for heartburn (currently controlled without medication). Negative for abdominal pain, constipation, diarrhea, nausea and vomiting.  Musculoskeletal:  Positive for joint pain (knees bilaterally). Negative for falls and myalgias.  Skin:  Negative for rash.  Neurological:  Positive for tingling (feet bilaterally). Negative for dizziness, tremors, loss of consciousness and headaches.  Psychiatric/Behavioral:  Negative for depression,  memory loss and suicidal ideas. The patient has insomnia.         Objective:   Physical Exam General appearance: alert, no distress, WD/WN,  female HEENT: normocephalic, sclerae anicteric, TMs pearly, nares patent, no discharge or erythema, pharynx normal Oral cavity: MMM, no lesions Neck: supple, no lymphadenopathy, no thyromegaly, no masses Heart: RRR, normal S1, S2, 1/6 systolic murmur RSB Lungs: CTA bilaterally, no wheezes, rhonchi, or rales Abdomen: +bs, soft, nontender, non distended, no masses, no hepatomegaly, no splenomegaly Musculoskeletal:  Swelling of knees bilaterally, medial aspect of both knees tender to touch, wheelchair for mobility Extremities: no edema, no cyanosis, no clubbing Pulses: 2+ symmetric, upper and lower extremities, normal cap refill Neurological: alert, oriented x 3, CN2-12 intact, strength normal upper extremities and lower extremities, good finger  to nose, DTRs 2+ throughout, decreased sensation bilateral feet and hands, Psychiatric: normal affect, behavior normal, pleasant   ION:GEXBM bradycardia, IRBBB, no ST changes  [facility]  07/06/2023

## 2023-07-06 ENCOUNTER — Ambulatory Visit
Admission: RE | Admit: 2023-07-06 | Discharge: 2023-07-06 | Disposition: A | Discharge status: Home / Self Care | Payer: Medicare PPO | Source: Ambulatory Visit | Attending: Nurse Practitioner | Admitting: Nurse Practitioner

## 2023-07-06 ENCOUNTER — Ambulatory Visit (INDEPENDENT_AMBULATORY_CARE_PROVIDER_SITE_OTHER): Payer: Medicare PPO | Admitting: Nurse Practitioner

## 2023-07-06 ENCOUNTER — Encounter: Payer: Self-pay | Admitting: Nurse Practitioner

## 2023-07-06 VITALS — BP 128/78 | HR 56 | Temp 97.9°F | Ht 61.0 in | Wt 143.0 lb

## 2023-07-06 DIAGNOSIS — Z87891 Personal history of nicotine dependence: Secondary | ICD-10-CM

## 2023-07-06 DIAGNOSIS — R748 Abnormal levels of other serum enzymes: Secondary | ICD-10-CM | POA: Diagnosis not present

## 2023-07-06 DIAGNOSIS — F324 Major depressive disorder, single episode, in partial remission: Secondary | ICD-10-CM

## 2023-07-06 DIAGNOSIS — M069 Rheumatoid arthritis, unspecified: Secondary | ICD-10-CM | POA: Diagnosis not present

## 2023-07-06 DIAGNOSIS — Z01818 Encounter for other preprocedural examination: Secondary | ICD-10-CM | POA: Diagnosis not present

## 2023-07-06 DIAGNOSIS — Z79899 Other long term (current) drug therapy: Secondary | ICD-10-CM

## 2023-07-06 DIAGNOSIS — E782 Mixed hyperlipidemia: Secondary | ICD-10-CM | POA: Diagnosis not present

## 2023-07-06 DIAGNOSIS — G8929 Other chronic pain: Secondary | ICD-10-CM

## 2023-07-06 DIAGNOSIS — E213 Hyperparathyroidism, unspecified: Secondary | ICD-10-CM | POA: Diagnosis not present

## 2023-07-06 DIAGNOSIS — M25562 Pain in left knee: Secondary | ICD-10-CM

## 2023-07-06 DIAGNOSIS — E039 Hypothyroidism, unspecified: Secondary | ICD-10-CM | POA: Diagnosis not present

## 2023-07-06 DIAGNOSIS — E559 Vitamin D deficiency, unspecified: Secondary | ICD-10-CM | POA: Diagnosis not present

## 2023-07-06 DIAGNOSIS — E663 Overweight: Secondary | ICD-10-CM

## 2023-07-06 DIAGNOSIS — I700 Atherosclerosis of aorta: Secondary | ICD-10-CM | POA: Diagnosis not present

## 2023-07-06 DIAGNOSIS — K219 Gastro-esophageal reflux disease without esophagitis: Secondary | ICD-10-CM

## 2023-07-06 DIAGNOSIS — I10 Essential (primary) hypertension: Secondary | ICD-10-CM | POA: Diagnosis not present

## 2023-07-06 NOTE — Patient Instructions (Signed)
Preparing for Knee Replacement Preparing for your knee replacement surgery can make your recovery easier and more comfortable. Talk with your health care provider so you can learn what will happen before, during, and after surgery. Ask questions if there's something you don't understand. Tell a health care provider about: Any allergies you have. All medicines you're taking, including vitamins, herbs, eye drops, creams, and over-the-counter medicines. Any problems you or family members have had with anesthesia. Any bleeding problems you have. Any surgeries you've had. Any medical conditions you have. Whether you're pregnant or may be pregnant. What happens before the procedure? Visit your providers You'll need to have a physical exam. When you go to the exam, bring a list of all the medicines and supplements you take. This includes herbs and vitamins. You may need more tests to check if it's safe for you to have surgery. Visit your dentist. Get your teeth cleaned and checked before the surgery. Germs from your mouth or an open wound can travel to your new joint and infect it. Tell your dentist that you plan to have a knee replacement. Keep all appointments. Know the costs of surgery To find out about the costs, call your insurance provider as soon as you decide to have surgery. Ask questions like: How much of the surgery and hospital stay will be covered? What will be covered for: Medical device? Physical therapy? Care at home? Prepare your home Pick a recovery spot that's not your bed. It's good to sit up a lot while you're getting better. A reclining chair might be a good spot. Put things that you often use on a small table near your spot. These may include the TV remote, a cordless phone or your mobile phone, a book, a laptop, and a water glass. Put the things you need on shelves and in drawers that are as high as your kitchen counter. Do this in your kitchen, bathroom, and bedroom.  This way, everything you need will be easy to reach. You may be given a walker to use at home. Check if you have enough room to use it. Try walking around your home with your hands out about 6 inches (15 cm) from your sides. If you don't hit anything while doing this, then you have enough room. Try walking from: Your spot to your kitchen and bathroom. Your bed to the bathroom. Prepare some meals to freeze and reheat later. Make your home safe for recovery     To prevent tripping, clear your floors. Pick up any clutter and throw rugs. Consider getting: Grab bars to put in the shower and near the toilet. A raised toilet seat. This will help you get on and off the toilet more easily. A tub or shower bench. Prepare your body If you smoke, quit as soon as you can. If there's time, it is best to quit several months before surgery. Tell your provider if you use any products that contain nicotine or tobacco. These products include cigarettes, chewing tobacco, and vaping devices, such as e-cigarettes. These can delay healing after surgery. If you need help quitting, ask your provider. Talk to your provider about doing exercises before your surgery. Doing these exercises in the weeks before your surgery may help lessen pain and improve movement in your knee after surgery. Do the exercises given by your provider. Eat a healthy diet. Do not change your diet unless your provider tells you to do that. Do not drink any alcohol for at least 48 hours  before surgery. Plan your recovery In the first few weeks after surgery, doing your usual activities might be tough. You may get tired quickly, and you won't be able to move your leg as much. To make sure you have all the help you need after your surgery: Plan to have a responsible adult take you home from the hospital. You will not be allowed to drive. Your provider will tell you how many days you can expect to be in the hospital. Do not do your normal  tasks and duties for at least 4-6 weeks after surgery. This includes work, caring for others, and volunteering. Plan to have a responsible adult stay with you day and night for the first week. This person should be someone you're comfortable with. You may need this person to help you with your exercises and with personal care, such as bathing and using the toilet. If you live alone, arrange for someone to take care of your home and pets for the first 4-6 weeks after surgery. Arrange for drivers to take you to follow-up visits, the grocery store, and other places you may need to go for at least 4-6 weeks. Consider applying for a disability parking permit. To get an application, call your local department of motor vehicles (DMV) or your provider's office. This information is not intended to replace advice given to you by your health care provider. Make sure you discuss any questions you have with your health care provider. Document Revised: 02/04/2023 Document Reviewed: 02/04/2023 Elsevier Patient Education  2024 [employer].

## 2023-07-07 LAB — CBC WITH DIFFERENTIAL/PLATELET
Absolute Monocytes: 649 {cells}/uL (ref 200–950)
Basophils Absolute: 71 {cells}/uL (ref 0–200)
Basophils Relative: 1.2 %
Eosinophils Absolute: 360 {cells}/uL (ref 15–500)
Eosinophils Relative: 6.1 %
HCT: 37.2 % (ref 35.0–45.0)
Hemoglobin: 12.1 g/dL (ref 11.7–15.5)
Lymphs Abs: 1269 {cells}/uL (ref 850–3900)
MCH: 30.6 pg (ref 27.0–33.0)
MCHC: 32.5 g/dL (ref 32.0–36.0)
MCV: 93.9 fL (ref 80.0–100.0)
MPV: 13 fL — ABNORMAL HIGH (ref 7.5–12.5)
Monocytes Relative: 11 %
Neutro Abs: 3552 {cells}/uL (ref 1500–7800)
Neutrophils Relative %: 60.2 %
Platelets: 155 10*3/uL (ref 140–400)
RBC: 3.96 10*6/uL (ref 3.80–5.10)
RDW: 12.8 % (ref 11.0–15.0)
Total Lymphocyte: 21.5 %
WBC: 5.9 10*3/uL (ref 3.8–10.8)

## 2023-07-07 LAB — COMPLETE METABOLIC PANEL WITH GFR
AG Ratio: 2.2 (calc) (ref 1.0–2.5)
ALT: 13 U/L (ref 6–29)
AST: 24 U/L (ref 10–35)
Albumin: 3.8 g/dL (ref 3.6–5.1)
Alkaline phosphatase (APISO): 65 U/L (ref 37–153)
BUN/Creatinine Ratio: 6 (calc) (ref 6–22)
BUN: 4 mg/dL — ABNORMAL LOW (ref 7–25)
CO2: 30 mmol/L (ref 20–32)
Calcium: 10.4 mg/dL (ref 8.6–10.4)
Chloride: 105 mmol/L (ref 98–110)
Creat: 0.72 mg/dL (ref 0.60–1.00)
Globulin: 1.7 g/dL — ABNORMAL LOW (ref 1.9–3.7)
Glucose, Bld: 79 mg/dL (ref 65–99)
Potassium: 4.2 mmol/L (ref 3.5–5.3)
Sodium: 142 mmol/L (ref 135–146)
Total Bilirubin: 0.5 mg/dL (ref 0.2–1.2)
Total Protein: 5.5 g/dL — ABNORMAL LOW (ref 6.1–8.1)
eGFR: 87 mL/min/{1.73_m2} (ref >=60)

## 2023-07-07 LAB — LIPID PANEL
Cholesterol: 169 mg/dL (ref <200)
HDL: 76 mg/dL (ref >=50)
LDL Cholesterol (Calc): 79 mg/dL
Non-HDL Cholesterol (Calc): 93 mg/dL (ref <130)
Total CHOL/HDL Ratio: 2.2 (calc) (ref <5.0)
Triglycerides: 55 mg/dL (ref <150)

## 2023-07-07 LAB — PROTIME-INR
INR: 1
Prothrombin Time: 11 s (ref 9.0–11.5)

## 2023-07-07 LAB — URINALYSIS, ROUTINE W REFLEX MICROSCOPIC
Bilirubin Urine: NEGATIVE
Glucose, UA: NEGATIVE
Hgb urine dipstick: NEGATIVE
Ketones, ur: NEGATIVE
Leukocytes,Ua: NEGATIVE
Nitrite: NEGATIVE
Protein, ur: NEGATIVE
Specific Gravity, Urine: 1.006 (ref 1.001–1.035)
pH: 6 (ref 5.0–8.0)

## 2023-07-07 LAB — TSH: TSH: 1.54 m[IU]/L (ref 0.40–4.50)

## 2023-07-15 ENCOUNTER — Emergency Department (HOSPITAL_COMMUNITY): Payer: Medicare PPO

## 2023-07-15 ENCOUNTER — Other Ambulatory Visit: Payer: Self-pay

## 2023-07-15 ENCOUNTER — Inpatient Hospital Stay (HOSPITAL_COMMUNITY): Payer: Medicare PPO | Admitting: Anesthesiology

## 2023-07-15 ENCOUNTER — Encounter (HOSPITAL_COMMUNITY): Payer: Self-pay

## 2023-07-15 ENCOUNTER — Inpatient Hospital Stay (HOSPITAL_COMMUNITY)
Admission: EM | Admit: 2023-07-15 | Discharge: 2023-07-17 | DRG: 480 | Disposition: A | Discharge status: Home Health | Payer: Medicare PPO | Attending: Family Medicine | Admitting: Family Medicine

## 2023-07-15 ENCOUNTER — Inpatient Hospital Stay (HOSPITAL_COMMUNITY): Payer: Medicare PPO

## 2023-07-15 ENCOUNTER — Encounter (HOSPITAL_COMMUNITY)
Admission: EM | Disposition: A | Discharge status: Home Health | Payer: Self-pay | Source: Home / Self Care | Attending: Internal Medicine

## 2023-07-15 DIAGNOSIS — S72002A Fracture of unspecified part of neck of left femur, initial encounter for closed fracture: Principal | ICD-10-CM | POA: Diagnosis present

## 2023-07-15 DIAGNOSIS — S72112A Displaced fracture of greater trochanter of left femur, initial encounter for closed fracture: Secondary | ICD-10-CM | POA: Diagnosis not present

## 2023-07-15 DIAGNOSIS — Y92009 Unspecified place in unspecified non-institutional (private) residence as the place of occurrence of the external cause: Secondary | ICD-10-CM

## 2023-07-15 DIAGNOSIS — Z888 Allergy status to other drugs, medicaments and biological substances status: Secondary | ICD-10-CM

## 2023-07-15 DIAGNOSIS — Z801 Family history of malignant neoplasm of trachea, bronchus and lung: Secondary | ICD-10-CM

## 2023-07-15 DIAGNOSIS — M160 Bilateral primary osteoarthritis of hip: Secondary | ICD-10-CM | POA: Diagnosis not present

## 2023-07-15 DIAGNOSIS — I5032 Chronic diastolic (congestive) heart failure: Secondary | ICD-10-CM | POA: Diagnosis not present

## 2023-07-15 DIAGNOSIS — Z8249 Family history of ischemic heart disease and other diseases of the circulatory system: Secondary | ICD-10-CM | POA: Diagnosis not present

## 2023-07-15 DIAGNOSIS — Z91048 Other nonmedicinal substance allergy status: Secondary | ICD-10-CM | POA: Diagnosis not present

## 2023-07-15 DIAGNOSIS — Z79890 Hormone replacement therapy (postmenopausal): Secondary | ICD-10-CM | POA: Diagnosis not present

## 2023-07-15 DIAGNOSIS — M25552 Pain in left hip: Secondary | ICD-10-CM | POA: Diagnosis not present

## 2023-07-15 DIAGNOSIS — R296 Repeated falls: Secondary | ICD-10-CM | POA: Diagnosis present

## 2023-07-15 DIAGNOSIS — Z6827 Body mass index (BMI) 27.0-27.9, adult: Secondary | ICD-10-CM

## 2023-07-15 DIAGNOSIS — K219 Gastro-esophageal reflux disease without esophagitis: Secondary | ICD-10-CM | POA: Diagnosis not present

## 2023-07-15 DIAGNOSIS — E039 Hypothyroidism, unspecified: Secondary | ICD-10-CM | POA: Diagnosis not present

## 2023-07-15 DIAGNOSIS — M25462 Effusion, left knee: Secondary | ICD-10-CM | POA: Diagnosis not present

## 2023-07-15 DIAGNOSIS — Z7982 Long term (current) use of aspirin: Secondary | ICD-10-CM | POA: Diagnosis not present

## 2023-07-15 DIAGNOSIS — S72142A Displaced intertrochanteric fracture of left femur, initial encounter for closed fracture: Secondary | ICD-10-CM | POA: Diagnosis not present

## 2023-07-15 DIAGNOSIS — E871 Hypo-osmolality and hyponatremia: Secondary | ICD-10-CM | POA: Diagnosis present

## 2023-07-15 DIAGNOSIS — I10 Essential (primary) hypertension: Secondary | ICD-10-CM | POA: Diagnosis not present

## 2023-07-15 DIAGNOSIS — Z79899 Other long term (current) drug therapy: Secondary | ICD-10-CM | POA: Diagnosis not present

## 2023-07-15 DIAGNOSIS — E782 Mixed hyperlipidemia: Secondary | ICD-10-CM | POA: Diagnosis not present

## 2023-07-15 DIAGNOSIS — D696 Thrombocytopenia, unspecified: Secondary | ICD-10-CM | POA: Insufficient documentation

## 2023-07-15 DIAGNOSIS — Z87891 Personal history of nicotine dependence: Secondary | ICD-10-CM | POA: Diagnosis not present

## 2023-07-15 DIAGNOSIS — G9341 Metabolic encephalopathy: Secondary | ICD-10-CM | POA: Diagnosis not present

## 2023-07-15 DIAGNOSIS — S72145A Nondisplaced intertrochanteric fracture of left femur, initial encounter for closed fracture: Secondary | ICD-10-CM | POA: Diagnosis not present

## 2023-07-15 DIAGNOSIS — M069 Rheumatoid arthritis, unspecified: Secondary | ICD-10-CM | POA: Diagnosis not present

## 2023-07-15 DIAGNOSIS — Z803 Family history of malignant neoplasm of breast: Secondary | ICD-10-CM

## 2023-07-15 DIAGNOSIS — S72102A Unspecified trochanteric fracture of left femur, initial encounter for closed fracture: Secondary | ICD-10-CM | POA: Diagnosis present

## 2023-07-15 DIAGNOSIS — Z96611 Presence of right artificial shoulder joint: Secondary | ICD-10-CM | POA: Diagnosis present

## 2023-07-15 DIAGNOSIS — D649 Anemia, unspecified: Secondary | ICD-10-CM | POA: Insufficient documentation

## 2023-07-15 DIAGNOSIS — Z79631 Long term (current) use of antimetabolite agent: Secondary | ICD-10-CM

## 2023-07-15 DIAGNOSIS — D72829 Elevated white blood cell count, unspecified: Secondary | ICD-10-CM | POA: Diagnosis present

## 2023-07-15 DIAGNOSIS — W1830XA Fall on same level, unspecified, initial encounter: Secondary | ICD-10-CM | POA: Diagnosis present

## 2023-07-15 DIAGNOSIS — E8809 Other disorders of plasma-protein metabolism, not elsewhere classified: Secondary | ICD-10-CM | POA: Diagnosis not present

## 2023-07-15 DIAGNOSIS — E785 Hyperlipidemia, unspecified: Secondary | ICD-10-CM | POA: Diagnosis present

## 2023-07-15 DIAGNOSIS — I700 Atherosclerosis of aorta: Secondary | ICD-10-CM | POA: Diagnosis not present

## 2023-07-15 DIAGNOSIS — I110 Hypertensive heart disease with heart failure: Secondary | ICD-10-CM | POA: Diagnosis not present

## 2023-07-15 DIAGNOSIS — E663 Overweight: Secondary | ICD-10-CM | POA: Diagnosis present

## 2023-07-15 DIAGNOSIS — Z800 Family history of malignant neoplasm of digestive organs: Secondary | ICD-10-CM

## 2023-07-15 DIAGNOSIS — S0990XA Unspecified injury of head, initial encounter: Secondary | ICD-10-CM | POA: Diagnosis not present

## 2023-07-15 DIAGNOSIS — S76312A Strain of muscle, fascia and tendon of the posterior muscle group at thigh level, left thigh, initial encounter: Secondary | ICD-10-CM | POA: Diagnosis not present

## 2023-07-15 DIAGNOSIS — M1712 Unilateral primary osteoarthritis, left knee: Secondary | ICD-10-CM | POA: Diagnosis present

## 2023-07-15 DIAGNOSIS — E213 Hyperparathyroidism, unspecified: Secondary | ICD-10-CM | POA: Diagnosis not present

## 2023-07-15 DIAGNOSIS — M25562 Pain in left knee: Secondary | ICD-10-CM | POA: Diagnosis not present

## 2023-07-15 DIAGNOSIS — M48061 Spinal stenosis, lumbar region without neurogenic claudication: Secondary | ICD-10-CM | POA: Insufficient documentation

## 2023-07-15 HISTORY — PX: INTRAMEDULLARY (IM) NAIL INTERTROCHANTERIC: SHX5875

## 2023-07-15 LAB — BASIC METABOLIC PANEL
Anion gap: 8 (ref 5–15)
BUN: 7 mg/dL — ABNORMAL LOW (ref 8–23)
CO2: 27 mmol/L (ref 22–32)
Calcium: 9.6 mg/dL (ref 8.9–10.3)
Chloride: 105 mmol/L (ref 98–111)
Creatinine, Ser: 0.75 mg/dL (ref 0.44–1.00)
GFR, Estimated: >60 mL/min (ref >60)
Glucose, Bld: 69 mg/dL — ABNORMAL LOW (ref 70–99)
Potassium: 3.6 mmol/L (ref 3.5–5.1)
Sodium: 140 mmol/L (ref 135–145)

## 2023-07-15 LAB — CBG MONITORING, ED: Glucose-Capillary: 133 mg/dL — ABNORMAL HIGH (ref 70–99)

## 2023-07-15 LAB — GLUCOSE, CAPILLARY
Glucose-Capillary: 73 mg/dL (ref 70–99)
Glucose-Capillary: 116 mg/dL — ABNORMAL HIGH (ref 70–99)
Glucose-Capillary: 104 mg/dL — ABNORMAL HIGH (ref 70–99)
Glucose-Capillary: 131 mg/dL — ABNORMAL HIGH (ref 70–99)

## 2023-07-15 LAB — CBC WITH DIFFERENTIAL/PLATELET
Abs Immature Granulocytes: 0.04 10*3/uL (ref 0.00–0.07)
Basophils Absolute: 0.1 10*3/uL (ref 0.0–0.1)
Basophils Relative: 1 %
Eosinophils Absolute: 0.8 10*3/uL — ABNORMAL HIGH (ref 0.0–0.5)
Eosinophils Relative: 7 %
HCT: 37.7 % (ref 36.0–46.0)
Hemoglobin: 12.3 g/dL (ref 12.0–15.0)
Immature Granulocytes: 0 %
Lymphocytes Relative: 7 %
Lymphs Abs: 0.7 10*3/uL (ref 0.7–4.0)
MCH: 31.3 pg (ref 26.0–34.0)
MCHC: 32.6 g/dL (ref 30.0–36.0)
MCV: 95.9 fL (ref 80.0–100.0)
Monocytes Absolute: 0.6 10*3/uL (ref 0.1–1.0)
Monocytes Relative: 6 %
Neutro Abs: 8.3 10*3/uL — ABNORMAL HIGH (ref 1.7–7.7)
Neutrophils Relative %: 79 %
Platelets: 111 10*3/uL — ABNORMAL LOW (ref 150–400)
RBC: 3.93 MIL/uL (ref 3.87–5.11)
RDW: 13.2 % (ref 11.5–15.5)
WBC: 10.4 10*3/uL (ref 4.0–10.5)
nRBC: 0 % (ref 0.0–0.2)

## 2023-07-15 LAB — PROTIME-INR
INR: 1.1 (ref 0.8–1.2)
Prothrombin Time: 14.9 s (ref 11.4–15.2)

## 2023-07-15 LAB — TYPE AND SCREEN
ABO/RH(D): AB POS
Antibody Screen: NEGATIVE

## 2023-07-15 LAB — ABO/RH: ABO/RH(D): AB POS

## 2023-07-15 SURGERY — FIXATION, FRACTURE, INTERTROCHANTERIC, WITH INTRAMEDULLARY ROD
Anesthesia: General | Laterality: Left

## 2023-07-15 MED ORDER — ROCURONIUM BROMIDE 10 MG/ML (PF) SYRINGE
PREFILLED_SYRINGE | INTRAVENOUS | Status: DC | PRN
  Administered 2023-07-15: 30 mg via INTRAVENOUS

## 2023-07-15 MED ORDER — ACETAMINOPHEN 10 MG/ML IV SOLN: 1000.0000 mg | Freq: Once | INTRAVENOUS | Status: DC | PRN

## 2023-07-15 MED ORDER — FENTANYL CITRATE PF 50 MCG/ML IJ SOSY: 25.0000 ug | PREFILLED_SYRINGE | INTRAMUSCULAR | Status: DC | PRN

## 2023-07-15 MED ORDER — HYDROXYCHLOROQUINE SULFATE 200 MG PO TABS
200.0000 mg | ORAL_TABLET | Freq: Two times a day (BID) | ORAL | Status: DC
  Administered 2023-07-16 – 2023-07-17 (×3): 200 mg via ORAL
  Filled 2023-07-15 (×3): qty 1

## 2023-07-15 MED ORDER — ACETAMINOPHEN 650 MG RE SUPP: 650.0000 mg | Freq: Four times a day (QID) | RECTAL | Status: DC | PRN

## 2023-07-15 MED ORDER — PANTOPRAZOLE SODIUM 40 MG PO TBEC
40.0000 mg | DELAYED_RELEASE_TABLET | Freq: Every day | ORAL | Status: DC
  Administered 2023-07-15 – 2023-07-17 (×3): 40 mg via ORAL
  Filled 2023-07-15 (×3): qty 1

## 2023-07-15 MED ORDER — DEXAMETHASONE SODIUM PHOSPHATE 10 MG/ML IJ SOLN
INTRAMUSCULAR | Status: DC | PRN
  Administered 2023-07-15: 8 mg via INTRAVENOUS

## 2023-07-15 MED ORDER — DEXAMETHASONE SODIUM PHOSPHATE 10 MG/ML IJ SOLN
INTRAMUSCULAR | Status: AC
  Filled 2023-07-15: qty 1

## 2023-07-15 MED ORDER — DEXTROSE 50 % IV SOLN
12.5000 g | Freq: Once | INTRAVENOUS | Status: AC
  Administered 2023-07-15: 12.5 g via INTRAVENOUS
  Filled 2023-07-15: qty 50

## 2023-07-15 MED ORDER — ONDANSETRON HCL 4 MG/2ML IJ SOLN
INTRAMUSCULAR | Status: AC
  Filled 2023-07-15: qty 2

## 2023-07-15 MED ORDER — EPHEDRINE 5 MG/ML INJ
INTRAVENOUS | Status: AC
  Filled 2023-07-15: qty 5

## 2023-07-15 MED ORDER — CEFAZOLIN SODIUM-DEXTROSE 2-4 GM/100ML-% IV SOLN
INTRAVENOUS | Status: AC
  Filled 2023-07-15: qty 100

## 2023-07-15 MED ORDER — OXYCODONE HCL 5 MG PO TABS
5.0000 mg | ORAL_TABLET | Freq: Four times a day (QID) | ORAL | Status: DC | PRN
  Administered 2023-07-15: 5 mg via ORAL
  Filled 2023-07-15: qty 1

## 2023-07-15 MED ORDER — 0.9 % SODIUM CHLORIDE (POUR BTL) OPTIME
TOPICAL | Status: DC | PRN
  Administered 2023-07-15: 1000 mL

## 2023-07-15 MED ORDER — MAGNESIUM GLUCONATE 500 MG PO TABS
500.0000 mg | ORAL_TABLET | ORAL | Status: DC
  Administered 2023-07-15: 500 mg via ORAL
  Filled 2023-07-15: qty 1

## 2023-07-15 MED ORDER — BUPIVACAINE HCL (PF) 0.25 % IJ SOLN
INTRAMUSCULAR | Status: DC | PRN
Start: 2023-07-15 — End: 2023-07-15
  Administered 2023-07-15: 20 mL

## 2023-07-15 MED ORDER — LIDOCAINE 2% (20 MG/ML) 5 ML SYRINGE
INTRAMUSCULAR | Status: DC | PRN
  Administered 2023-07-15: 60 mg via INTRAVENOUS

## 2023-07-15 MED ORDER — DEXTROSE 50 % IV SOLN
INTRAVENOUS | Status: AC
  Filled 2023-07-15: qty 50

## 2023-07-15 MED ORDER — EPHEDRINE SULFATE-NACL 50-0.9 MG/10ML-% IV SOSY
PREFILLED_SYRINGE | INTRAVENOUS | Status: DC | PRN
  Administered 2023-07-15 (×2): 5 mg via INTRAVENOUS

## 2023-07-15 MED ORDER — PROPOFOL 10 MG/ML IV BOLUS
INTRAVENOUS | Status: AC
  Filled 2023-07-15: qty 20

## 2023-07-15 MED ORDER — AMISULPRIDE (ANTIEMETIC) 5 MG/2ML IV SOLN: 10.0000 mg | Freq: Once | INTRAVENOUS | Status: DC | PRN

## 2023-07-15 MED ORDER — GABAPENTIN 300 MG PO CAPS
300.0000 mg | ORAL_CAPSULE | Freq: Every evening | ORAL | Status: DC
  Administered 2023-07-15 – 2023-07-16 (×2): 300 mg via ORAL
  Filled 2023-07-15 (×2): qty 1

## 2023-07-15 MED ORDER — PHENYLEPHRINE HCL-NACL 20-0.9 MG/250ML-% IV SOLN
INTRAVENOUS | Status: DC | PRN
Start: 2023-07-15 — End: 2023-07-15
  Administered 2023-07-15: 20 ug/min via INTRAVENOUS

## 2023-07-15 MED ORDER — LORAZEPAM 0.5 MG PO TABS
0.5000 mg | ORAL_TABLET | Freq: Once | ORAL | Status: AC | PRN
  Administered 2023-07-15: 0.5 mg via ORAL
  Filled 2023-07-15: qty 1

## 2023-07-15 MED ORDER — LIDOCAINE HCL (PF) 2 % IJ SOLN
INTRAMUSCULAR | Status: AC
  Filled 2023-07-15: qty 5

## 2023-07-15 MED ORDER — FENTANYL CITRATE PF 50 MCG/ML IJ SOSY
50.0000 ug | PREFILLED_SYRINGE | INTRAMUSCULAR | Status: DC | PRN
  Administered 2023-07-15: 50 ug via INTRAVENOUS
  Filled 2023-07-15: qty 1

## 2023-07-15 MED ORDER — PHENYLEPHRINE HCL-NACL 20-0.9 MG/250ML-% IV SOLN
INTRAVENOUS | Status: AC
  Filled 2023-07-15: qty 250

## 2023-07-15 MED ORDER — LACTATED RINGERS IV SOLN: INTRAVENOUS | Status: DC

## 2023-07-15 MED ORDER — BUPIVACAINE HCL 0.25 % IJ SOLN
INTRAMUSCULAR | Status: AC
  Filled 2023-07-15: qty 1

## 2023-07-15 MED ORDER — DEXTROSE 50 % IV SOLN
12.5000 g | Freq: Once | INTRAVENOUS | Status: AC
  Administered 2023-07-15: 12.5 g via INTRAVENOUS

## 2023-07-15 MED ORDER — ONDANSETRON HCL 4 MG PO TABS: 4.0000 mg | ORAL_TABLET | Freq: Four times a day (QID) | ORAL | Status: DC | PRN

## 2023-07-15 MED ORDER — PROPOFOL 10 MG/ML IV BOLUS
INTRAVENOUS | Status: DC | PRN
  Administered 2023-07-15: 50 mg via INTRAVENOUS

## 2023-07-15 MED ORDER — ACETAMINOPHEN 325 MG PO TABS
650.0000 mg | ORAL_TABLET | Freq: Four times a day (QID) | ORAL | Status: DC | PRN
  Administered 2023-07-16 – 2023-07-17 (×3): 650 mg via ORAL
  Filled 2023-07-15 (×3): qty 2

## 2023-07-15 MED ORDER — POVIDONE-IODINE 10 % EX SWAB
2.0000 | Freq: Once | CUTANEOUS | Status: AC
  Administered 2023-07-15: 2 via TOPICAL

## 2023-07-15 MED ORDER — SUGAMMADEX SODIUM 200 MG/2ML IV SOLN
INTRAVENOUS | Status: DC | PRN
  Administered 2023-07-15: 200 mg via INTRAVENOUS

## 2023-07-15 MED ORDER — FENTANYL CITRATE (PF) 100 MCG/2ML IJ SOLN
INTRAMUSCULAR | Status: DC | PRN
  Administered 2023-07-15: 100 ug via INTRAVENOUS

## 2023-07-15 MED ORDER — CHLORHEXIDINE GLUCONATE 4 % EX SOLN: 60.0000 mL | Freq: Once | CUTANEOUS | Status: DC

## 2023-07-15 MED ORDER — FENTANYL CITRATE (PF) 100 MCG/2ML IJ SOLN
INTRAMUSCULAR | Status: AC
  Filled 2023-07-15: qty 2

## 2023-07-15 MED ORDER — CHLORHEXIDINE GLUCONATE 0.12 % MT SOLN
15.0000 mL | Freq: Once | OROMUCOSAL | Status: AC
  Administered 2023-07-15: 15 mL via OROMUCOSAL

## 2023-07-15 MED ORDER — ONDANSETRON HCL 4 MG/2ML IJ SOLN: 4.0000 mg | Freq: Four times a day (QID) | INTRAMUSCULAR | Status: DC | PRN

## 2023-07-15 MED ORDER — TRAZODONE HCL 50 MG PO TABS: 150.0000 mg | ORAL_TABLET | Freq: Every evening | ORAL | Status: DC | PRN

## 2023-07-15 MED ORDER — ROCURONIUM BROMIDE 10 MG/ML (PF) SYRINGE
PREFILLED_SYRINGE | INTRAVENOUS | Status: AC
  Filled 2023-07-15: qty 10

## 2023-07-15 MED ORDER — ONDANSETRON HCL 4 MG/2ML IJ SOLN
INTRAMUSCULAR | Status: DC | PRN
  Administered 2023-07-15: 4 mg via INTRAVENOUS

## 2023-07-15 MED ORDER — DEXTROSE 5 % IV SOLN: INTRAVENOUS | Status: DC

## 2023-07-15 MED ORDER — CEFAZOLIN SODIUM-DEXTROSE 2-4 GM/100ML-% IV SOLN
2.0000 g | INTRAVENOUS | Status: AC
  Administered 2023-07-15: 2 g via INTRAVENOUS

## 2023-07-15 MED ORDER — ONDANSETRON HCL 4 MG/2ML IJ SOLN: 4.0000 mg | Freq: Once | INTRAMUSCULAR | Status: DC | PRN

## 2023-07-15 MED ORDER — LEVOTHYROXINE SODIUM 88 MCG PO TABS
88.0000 ug | ORAL_TABLET | Freq: Every day | ORAL | Status: DC
  Administered 2023-07-16 – 2023-07-17 (×2): 88 ug via ORAL
  Filled 2023-07-15 (×2): qty 1

## 2023-07-15 SURGICAL SUPPLY — 42 items
BAG COUNTER SPONGE SURGICOUNT (BAG) IMPLANT
BAG SPEC THK2 15X12 ZIP CLS (MISCELLANEOUS)
BAG SPNG CNTER NS LX DISP (BAG)
BAG ZIPLOCK 12X15 (MISCELLANEOUS) IMPLANT
BIT DRILL INTERTAN LAG SCREW (BIT) IMPLANT
BIT DRILL LONG 4.0 (BIT) IMPLANT
BNDG GAUZE DERMACEA FLUFF 4 (GAUZE/BANDAGES/DRESSINGS) ×1 IMPLANT
BNDG GZE DERMACEA 4 6PLY (GAUZE/BANDAGES/DRESSINGS) ×1
BOOTIES KNEE HIGH SLOAN (MISCELLANEOUS) ×1 IMPLANT
COVER SURGICAL LIGHT HANDLE (MISCELLANEOUS) ×1 IMPLANT
DRAPE STERI IOBAN 125X83 (DRAPES) ×1 IMPLANT
DRILL BIT LONG 4.0 (BIT) ×1 IMPLANT
DRSG AQUACEL AG ADV 3.5X 4 (GAUZE/BANDAGES/DRESSINGS) ×3 IMPLANT
DRSG TEGADERM 4X4.75 (GAUZE/BANDAGES/DRESSINGS) IMPLANT
DURAPREP 26ML APPLICATOR (WOUND CARE) ×2 IMPLANT
ELECT REM PT RETURN 15FT ADLT (MISCELLANEOUS) ×1 IMPLANT
GAUZE SPONGE 4X4 12PLY STRL (GAUZE/BANDAGES/DRESSINGS) IMPLANT
GAUZE XEROFORM 1X8 LF (GAUZE/BANDAGES/DRESSINGS) IMPLANT
GLOVE BIOGEL PI IND STRL 8 (GLOVE) ×2 IMPLANT
GLOVE ECLIPSE 7.5 STRL STRAW (GLOVE) ×2 IMPLANT
GOWN STRL REUS W/ TWL LRG LVL3 (GOWN DISPOSABLE) ×1 IMPLANT
GOWN STRL REUS W/ TWL XL LVL3 (GOWN DISPOSABLE) ×2 IMPLANT
GOWN STRL REUS W/TWL LRG LVL3 (GOWN DISPOSABLE) ×1
GOWN STRL REUS W/TWL XL LVL3 (GOWN DISPOSABLE) ×2
GUIDE PIN 3.2X343 (PIN) ×2 IMPLANT
GUIDE PIN 3.2X343MM (PIN) ×2
KIT BASIN OR (CUSTOM PROCEDURE TRAY) ×1 IMPLANT
KIT TURNOVER KIT A (KITS) IMPLANT
MANIFOLD NEPTUNE II (INSTRUMENTS) ×1 IMPLANT
NAIL INTERTAN 10X18 130D 10S (Nail) IMPLANT
NS IRRIG 1000ML POUR BTL (IV SOLUTION) IMPLANT
PACK GENERAL/GYN (CUSTOM PROCEDURE TRAY) ×1 IMPLANT
PIN GUIDE 3.2X343MM (PIN) IMPLANT
PROTECTOR NERVE ULNAR (MISCELLANEOUS) ×1 IMPLANT
SCREW LAG COMPR KIT 95/90 (Screw) IMPLANT
SCREW TRIGEN LOW PROF 5.0X35 (Screw) IMPLANT
STAPLER VISISTAT 35W (STAPLE) ×1 IMPLANT
SUT MNCRL AB 3-0 PS2 18 (SUTURE) ×1 IMPLANT
SUT VIC AB 0 CT1 27 (SUTURE) ×1
SUT VIC AB 0 CT1 27XBRD ANTBC (SUTURE) ×1 IMPLANT
SUT VIC AB 2-0 CT1 27 (SUTURE) ×1
SUT VIC AB 2-0 CT1 TAPERPNT 27 (SUTURE) ×1 IMPLANT

## 2023-07-15 NOTE — ED Notes (Signed)
ED TO INPATIENT HANDOFF REPORT  ED Nurse Name and Phone #: Suann Larry Name/Age/Gender [name] 77 y.o. female Room/Bed: WHALA/WHALA  Code Status   Code Status: Prior  Home/SNF/Other Home Patient oriented to: self, place, time, and situation Is this baseline? Yes   Triage Complete: Triage complete  Chief Complaint Trochanteric fracture of left femur (HCC) [S72.102A]  Triage Note Pt BIB EMS from Home. Pt fell Wednesday night. Hit her head and her left side. Pt c/o pain in the back of her head and L hip pain. Non ambulatory since injury. No blood thinners. No LOC.  BP 180/90 HR 77 Sp02 95% RA   Allergies Allergies  Allergen Reactions   Ace Inhibitors Cough   Ambien [Zolpidem] Other (See Comments)    Odd Feeling   Crestor [Rosuvastatin]    Leflunomide Other (See Comments)   Pravastatin    Prozac [Fluoxetine Hcl] Other (See Comments)    Decreased libido   Zoloft [Sertraline Hcl]     Level of Care/Admitting Diagnosis ED Disposition     ED Disposition  Admit   Condition  --   Comment  Hospital Area: [facility] [[id number]]  Level of Care: Telemetry [5]  Admit to tele based on following criteria: Monitor QTC interval  Admit to tele based on following criteria: Monitor for Ischemic changes  May admit patient to [facility] or [facility] if equivalent level of care is available:: No  Covid Evaluation: Asymptomatic - no recent exposure (last 10 days) testing not required  Diagnosis: Trochanteric fracture of left femur [facility]) [[id number]]  Admitting Physician: Bobette Mo [[id number]]  Attending Physician: Bobette Mo [[id number]]  Certification:: I certify this patient will need inpatient services for at least 2 midnights  Expected Medical Readiness: 07/18/2023          B Medical/Surgery History Past Medical History:  Diagnosis Date   Anemia    Hx of    Arthritis    Calculus of bile duct without mention of cholecystitis or  obstruction    Cataract    GERD (gastroesophageal reflux disease)    H. pylori infection    Hx of    Heart murmur    Hyperlipidemia    Hypertension    Hypothyroid    PUD (peptic ulcer disease)    Rheumatoid arthritis(714.0)    Past Surgical History:  Procedure Laterality Date   AV FISTULA REPAIR     BLADDER SUSPENSION     BREAST CYST EXCISION Left    BUNIONECTOMY     CARPAL TUNNEL RELEASE     CHOLECYSTECTOMY  09/07/2011   CYSTECTOMY     left breast   ERCP  09/29/2011   Procedure: ENDOSCOPIC RETROGRADE CHOLANGIOPANCREATOGRAPHY (ERCP);  Surgeon: Louis Meckel, MD;  Location: [facility];  Service: Endoscopy;  Laterality: N/A;   PILONIDAL CYST EXCISION     TENOLYSIS  10/26/2011   Procedure: TENDON SHEATH RELEASE/TENOLYSIS;  Surgeon: Nicki Reaper, MD;  Location: [facility];  Service: Orthopedics;  Laterality: Right;  tenosynovectomy removal superficialis slip right index finger   TONSILLECTOMY     TOTAL SHOULDER REPLACEMENT Right 02/22/2014   Dr[facility]a So at [facility] RELEASE     VAGINAL HYSTERECTOMY     ovaries not removed     A IV Location/Drains/Wounds Patient Lines/Drains/Airways Status     Active Line/Drains/Airways     Name Placement date Placement time Site Days   Peripheral IV 07/15/23 20 G  Left Antecubital 07/15/23  1302  Antecubital  less than 1            Intake/Output Last 24 hours No intake or output data in the 24 hours ending 07/15/23 1547  Labs/Imaging Results for orders placed or performed during the hospital encounter of 07/15/23 (from the past 48 hour(s))  Basic metabolic panel     Status: Abnormal   Collection Time: 07/15/23 12:55 PM  Result Value Ref Range   Sodium 140 135 - 145 mmol/L   Potassium 3.6 3.5 - 5.1 mmol/L   Chloride 105 98 - 111 mmol/L   CO2 27 22 - 32 mmol/L   Glucose, Bld 69 (L) 70 - 99 mg/dL    Comment: Glucose reference range applies only to samples taken after fasting for at least 8  hours.   BUN 7 (L) 8 - 23 mg/dL   Creatinine, Ser 4.09 0.44 - 1.00 mg/dL   Calcium 9.6 8.9 - 81.1 mg/dL   GFR, Estimated >91 >47 mL/min    Comment: (NOTE) Calculated using the CKD-EPI Creatinine Equation (2021)    Anion gap 8 5 - 15    Comment: Performed at [facility], [address] [place], [place] [address]  CBC with Differential     Status: Abnormal   Collection Time: 07/15/23 12:55 PM  Result Value Ref Range   WBC 10.4 4.0 - 10.5 K/uL   RBC 3.93 3.87 - 5.11 MIL/uL   Hemoglobin 12.3 12.0 - 15.0 g/dL   HCT 21.3 08.6 - 57.8 %   MCV 95.9 80.0 - 100.0 fL   MCH 31.3 26.0 - 34.0 pg   MCHC 32.6 30.0 - 36.0 g/dL   RDW 46.9 62.9 - 52.8 %   Platelets 111 (L) 150 - 400 K/uL   nRBC 0.0 0.0 - 0.2 %   Neutrophils Relative % 79 %   Neutro Abs 8.3 (H) 1.7 - 7.7 K/uL   Lymphocytes Relative 7 %   Lymphs Abs 0.7 0.7 - 4.0 K/uL   Monocytes Relative 6 %   Monocytes Absolute 0.6 0.1 - 1.0 K/uL   Eosinophils Relative 7 %   Eosinophils Absolute 0.8 (H) 0.0 - 0.5 K/uL   Basophils Relative 1 %   Basophils Absolute 0.1 0.0 - 0.1 K/uL   Immature Granulocytes 0 %   Abs Immature Granulocytes 0.04 0.00 - 0.07 K/uL    Comment: Performed at [facility], [address] [place], [place] [address]  Protime-INR     Status: None   Collection Time: 07/15/23 12:55 PM  Result Value Ref Range   Prothrombin Time 14.9 11.4 - 15.2 seconds   INR 1.1 0.8 - 1.2    Comment: (NOTE) INR goal varies based on device and disease states. Performed at [facility], [address], [place], [place] [address]   Type and screen [facility]     Status: None (Preliminary result)   Collection Time: 07/15/23 12:55 PM  Result Value Ref Range   ABO/RH(D) PENDING    Antibody Screen PENDING    Sample Expiration      07/18/2023,2359 Performed at [facility], [address], [place], [place] [address]   CBG monitoring, ED      Status: Abnormal   Collection Time: 07/15/23  3:41 PM  Result Value Ref Range   Glucose-Capillary 133 (H) 70 - 99 mg/dL    Comment: Glucose reference range applies only to samples taken after fasting  for at least 8 hours.   DG Knee Complete 4 Views Left  Result Date: 07/15/2023 CLINICAL DATA:  Pain, fall. EXAM: LEFT KNEE - COMPLETE 4+ VIEW COMPARISON:  Remote left knee radiograph 09/20/2005 FINDINGS: No acute fracture or dislocation. There is mild lateral tibiofemoral joint space narrowing. Mild tricompartmental peripheral spurring. Minimal knee joint effusion. 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 This is unchanged from 2006 exam and definitively benign. No focal soft tissue abnormalities. IMPRESSION: 1. No acute fracture or dislocation of the left knee. 2. Mild tricompartmental osteoarthritis. Electronically Signed   By: Narda Rutherford M.D.   On: 07/15/2023 13:23   DG Hip Unilat With Pelvis 2-3 Views Left  Result Date: 07/15/2023 CLINICAL DATA:  Fall, pain. EXAM: DG HIP (WITH OR WITHOUT PELVIS) 2-3V LEFT COMPARISON:  None Available. FINDINGS: Displaced proximal femur fracture involving the greater trochanter. This may be isolated to the greater trochanter without intertrochanteric involvement. Femoral head is well seated. No hip dislocation. Bony pelvis including pubic rami are intact. Pubic symphysis and sacroiliac joints are congruent. Mild bilateral hip degenerative change. IMPRESSION: Displaced proximal femur fracture involving the greater trochanter. This may represent an isolated greater trochanteric fracture, without a definite intertrochanteric component. Electronically Signed   By: Narda Rutherford M.D.   On: 07/15/2023 13:20    Pending Labs Unresulted Labs (From admission, onward)     Start     Ordered   07/15/23 1430  ABO/Rh  Once,   R        07/15/23 1430            Vitals/Pain Today's Vitals   07/15/23  1030 07/15/23 1043 07/15/23 1302 07/15/23 1352  BP:  (!) 163/111  (!) 150/74  Pulse:  74  76  Resp:  16  19  Temp:  98.7 F (37.1 C)  98.6 F (37 C)  TempSrc:    Oral  SpO2:  97%  93%  PainSc: 5   10-Worst pain ever     Isolation Precautions No active isolations  Medications Medications  fentaNYL (SUBLIMAZE) injection 50 mcg (50 mcg Intravenous Given 07/15/23 1311)  LORazepam (ATIVAN) tablet 0.5 mg (0.5 mg Oral Given 07/15/23 1409)  dextrose 50 % solution 12.5 g (12.5 g Intravenous Given 07/15/23 1524)    Mobility non-ambulatory     Focused Assessments Left Hip Pain   R Recommendations: See Admitting Provider Note  Report given to:   Additional Notes: .

## 2023-07-15 NOTE — H&P (Signed)
History and Physical    Patient: [name] BJY:[id number] DOB: 14-Sep-1946 DOA: 07/15/2023 DOS: the patient was seen and examined on 07/15/2023 PCP: Lucky Cowboy, MD  Patient coming from: Home  Chief Complaint:  Chief Complaint  Patient presents with   Fall   Hip Pain   HPI: VEATRICE MCCUMBER is a 77 y.o. female with medical history significant of anemia, osteoarthritis, cholelithiasis, cataracts, GERD, PUD, H. pylori infection, heart murmur, hyperlipidemia, hypertension, hypothyroidism, rheumatoid arthritis who presented to the emergency department with complaints of left hip pain after having a mechanical fall at home after her knee buckled trying to stand up.  She has multiple falls recently.  She developed immediate pain but was able to ambulate.  However, since then the pain has become so intense that she has difficulty bearing weight on her left hip. He denied fever, chills, rhinorrhea, sore throat, wheezing or hemoptysis.  No chest pain, palpitations, diaphoresis, PND, orthopnea or pitting edema of the lower extremities.  No abdominal pain, nausea, emesis, diarrhea, constipation, melena or hematochezia.  No flank pain, dysuria, frequency or hematuria.  No polyuria, polydipsia, polyphagia or blurred vision.   Lab work: CBC showed white count 10.4, hemoglobin 12.3 g deciliter platelets 111.  PT 14.9 and INR 1.1.  BMP showed a glucose of 69 and BUN of 7 mg/dL, the rest of the BMP measurements were normal.  Imaging: Left hip x-ray showed displaced proximal femur fracture involving the greater trochanter which may be isolated since there is no definite intertrochanteric component.  Left knee to x-ray with no acute fracture or dislocation.  Mild tricompartmental osteoarthritis.  ED course: Initial vital signs were temperature 98.7 F, pulse 74, respirations 16, BP 163/111 mmHg O2 sat 97% on room air.  The patient received lorazepam 0.5 mg IVP for MRI imaging and 50 mcg of fentanyl  IVP.  Review of Systems: As mentioned in the history of present illness. All other systems reviewed and are negative. Past Medical History:  Diagnosis Date   Anemia    Hx of    Arthritis    Calculus of bile duct without mention of cholecystitis or obstruction    Cataract    GERD (gastroesophageal reflux disease)    H. pylori infection    Hx of    Heart murmur    Hyperlipidemia    Hypertension    Hypothyroid    PUD (peptic ulcer disease)    Rheumatoid arthritis(714.0)    Past Surgical History:  Procedure Laterality Date   AV FISTULA REPAIR     BLADDER SUSPENSION     BREAST CYST EXCISION Left    BUNIONECTOMY     CARPAL TUNNEL RELEASE     CHOLECYSTECTOMY  09/07/2011   CYSTECTOMY     left breast   ERCP  09/29/2011   Procedure: ENDOSCOPIC RETROGRADE CHOLANGIOPANCREATOGRAPHY (ERCP);  Surgeon: Louis Meckel, MD;  Location: [facility];  Service: Endoscopy;  Laterality: N/A;   PILONIDAL CYST EXCISION     TENOLYSIS  10/26/2011   Procedure: TENDON SHEATH RELEASE/TENOLYSIS;  Surgeon: Nicki Reaper, MD;  Location: [facility];  Service: Orthopedics;  Laterality: Right;  tenosynovectomy removal superficialis slip right index finger   TONSILLECTOMY     TOTAL SHOULDER REPLACEMENT Right 02/22/2014   Dr[facility]alk[facility] So at [facility] RELEASE     VAGINAL HYSTERECTOMY     ovaries not removed   Social History:  reports that she quit smoking about 27 years ago. Her smoking  use included cigarettes. She has never used smokeless tobacco. She reports current alcohol use. She reports that she does not use drugs.  Allergies  Allergen Reactions   Ace Inhibitors Cough   Ambien [Zolpidem] Other (See Comments)    Odd Feeling   Crestor [Rosuvastatin]    Leflunomide Other (See Comments)   Pravastatin    Prozac [Fluoxetine Hcl] Other (See Comments)    Decreased libido   Zoloft [Sertraline Hcl]     Family History  Problem Relation Age of Onset   Lung cancer Mother     Stomach cancer Father    Heart attack Father        MI at age 76   Colon cancer Maternal Grandfather    Heart attack Brother        MI at age 9   Prostate cancer Paternal Grandfather    Breast cancer Paternal Aunt     Prior to Admission medications   Medication Sig Start Date End Date Taking? Authorizing Provider  amLODipine (NORVASC) 2.5 MG tablet Take 1 tablet (2.5 mg total) by mouth daily. 10/21/22   Raynelle Dick, NP  Ascorbic Acid (VITAMIN C) 1000 MG tablet Take 1,000 mg by mouth at bedtime.    [provider]  aspirin 81 MG tablet Take 81 mg by mouth daily. 3 a week    [provider]  Cholecalciferol (VITAMIN D PO) Take 5,000 Units by mouth See admin instructions. Takes 3 times a week    [provider]  folic acid (FOLVITE) 1 MG tablet Take 3 mg by mouth daily.    [provider]  gabapentin (NEURONTIN) 600 MG tablet TAKE 1/2 TO 1 TABLET 2 TO 3 TIMES DAILY AS NEEDED FOR PAIN 12/07/20   Lucky Cowboy, MD  hydroxychloroquine (PLAQUENIL) 200 MG tablet Take 200 mg by mouth 2 (two) times daily.    [provider]  ibuprofen (IBU) 800 MG tablet TAKE 1 TABLET 2-3 TIMES DAILY WITH FOOD FOR PAIN AND INFLAMMATION 03/21/23   Lucky Cowboy, MD  levothyroxine (SYNTHROID) 50 MCG tablet Take  1 tablet  Daily  on an empty stomach with only water for 30 minutes & no Antacid meds, Calcium or Magnesium for 4 hours & avoid Biotin 01/23/23   Lucky Cowboy, MD  magnesium gluconate (MAGONATE) 500 MG tablet Take 500 mg by mouth at bedtime. 3 times /week    [provider]  methotrexate 50 MG/2ML injection Inject 25 mg into the skin once a week. Saturdays 04/16/21   [provider]  olmesartan (BENICAR) 20 MG tablet TAKE 1 TABLET EVERY DAY FOR BLOOD PRESSURE 03/05/23   Lucky Cowboy, MD  omeprazole (PRILOSEC) 40 MG capsule TAKE 1 CAPSULE TWICE DAILY TO PREVENT HEARTBURN AND INDIGESTION 01/13/23   Raynelle Dick, NP  traZODone  (DESYREL) 150 MG tablet TAKE 1 TABLET 1 HOUR BEFORE BEDTIME AS NEEDED FOR SLEEP 03/03/22   Raynelle Dick, NP  zinc gluconate 50 MG tablet Take 50 mg by mouth daily. 3 times a week    [provider]    Physical Exam: Vitals:   07/15/23 1043 07/15/23 1352  BP: (!) 163/111 (!) 150/74  Pulse: 74 76  Resp: 16 19  Temp: 98.7 F (37.1 C) 98.6 F (37 C)  TempSrc:  Oral  SpO2: 97% 93%   Physical Exam Vitals and nursing note reviewed.  Constitutional:      General: She is awake. She is not in acute distress. HENT:  Head: Normocephalic.     Nose: No rhinorrhea.  Eyes:     General: No scleral icterus.    Pupils: Pupils are equal, round, and reactive to light.  Neck:     Vascular: No JVD.  Cardiovascular:     Rate and Rhythm: Normal rate and regular rhythm.     Heart sounds: S1 normal and S2 normal.  Pulmonary:     Effort: Pulmonary effort is normal.     Breath sounds: Normal breath sounds.  Abdominal:     General: Bowel sounds are normal.     Palpations: Abdomen is soft.  Musculoskeletal:     Cervical back: Neck supple.     Right lower leg: No edema.     Left lower leg: No edema.  Skin:    General: Skin is warm and dry.  Neurological:     General: No focal deficit present.     Mental Status: She is alert and oriented to person, place, and time.  Psychiatric:        Mood and Affect: Mood normal.        Behavior: Behavior normal. Behavior is cooperative.     Data Reviewed:  Results are pending, will review when available.  06/08/2021 TTE. IMPRESSIONS:   1. Left ventricular ejection fraction, by estimation, is 60 to 65%. The  left ventricle has normal function. The left ventricle has no regional  wall motion abnormalities. There is mild left ventricular hypertrophy.  Left ventricular diastolic parameters  are consistent with Grade II diastolic dysfunction (pseudonormalization).  Elevated left atrial pressure.   2. Right ventricular systolic function  is normal. The right ventricular  size is normal. Tricuspid regurgitation signal is inadequate for assessing  PA pressure.   3. Left atrial size was severely dilated.   4. The mitral valve is normal in structure. No evidence of mitral valve  regurgitation. No evidence of mitral stenosis.   5. The aortic valve is tricuspid. Aortic valve regurgitation is not  visualized. No aortic stenosis is present.   6. The inferior vena cava is normal in size with greater than 50%  respiratory variability, suggesting right atrial pressure of 3 mmHg.   Conclusion(s)/Recommendation(s): No vegetation seen. If high clinical  suspicion for endocarditis, consider TEE.   Assessment and Plan: Principal Problem:   Trochanteric fracture of left femur (HCC) Admit to telemetry/inpatient. Ice area as needed. Buck's traction per protocol. Analgesics as needed. Antiemetics as needed. Consult TOC team. Consult nutritional services. PT evaluation after surgery. Orthopedic surgery evaluation appreciated.  Active Problems:   Essential hypertension Monitor BP. Antihypertensives as needed.    Hyperlipidemia, mixed   Aortic atherosclerosis (HCC) by CXR in 2021 On lifestyle modifications only. The patient is on methotrexate. Patient to discuss this with her PCP.    Rheumatoid arthritis (HCC)  On weekly methotrexate. Continue hydroxychloroquine 200 mg p.o. twice daily.    Gastroesophageal reflux disease without esophagitis Continue omeprazole or formulary equivalent.    Hypothyroidism Continue levothyroxine 88 mcg p.o. daily.     Advance Care Planning:   Code Status: Full Code   Consults: Orthopedic surgery.  Family Communication:   Severity of Illness: The appropriate patient status for this patient is OBSERVATION. Observation status is judged to be reasonable and necessary in order to provide the required intensity of service to ensure the patient's safety. The patient's presenting symptoms,  physical exam findings, and initial radiographic and laboratory data in the context of their medical condition is felt to place them  at decreased risk for further clinical deterioration. Furthermore, it is anticipated that the patient will be medically stable for discharge from the hospital within 2 midnights of admission.   Author: Bobette Mo, MD 07/15/2023 2:31 PM  For on call review [email or web address].   This document was prepared using Dragon voice recognition software and may contain some unintended transcription errors.

## 2023-07-15 NOTE — Brief Op Note (Signed)
07/15/2023  5:38 PM  PATIENT:  Crista Elliot  77 y.o. female  PRE-OPERATIVE DIAGNOSIS:  left hip intertrochanteric hip fracture  POST-OPERATIVE DIAGNOSIS:  * No post-op diagnosis entered *  PROCEDURE:  Procedure(s): INTRAMEDULLARY (IM) NAIL INTERTROCHANTERIC (Left)  SURGEON:  Surgeons and Role:    * Luci Bank, MD - Primary  PHYSICIAN ASSISTANT:   ASSISTANTS: none   ANESTHESIA:   general  EBL:  150cc   BLOOD ADMINISTERED:none  DRAINS: none   LOCAL MEDICATIONS USED:  MARCAINE     SPECIMEN:  No Specimen  DISPOSITION OF SPECIMEN:  N/A  COUNTS:  YES  TOURNIQUET:  * No tourniquets in log *  DICTATION: .Note written in EPIC  PLAN OF CARE: Admit to inpatient   PATIENT DISPOSITION:  PACU - hemodynamically stable.   Delay start of Pharmacological VTE agent (>24hrs) due to surgical blood loss or risk of bleeding: no

## 2023-07-15 NOTE — Consult Note (Signed)
Brief Consult Note Full consult note to follow after seeing patient Information gathered from chart review  77 year old female who sustained a ground level fall Wednesday night and has been non ambulatory since fall  Radiographs demonstrate a left hip greater trochanter fracture. Given high incidence of intertrochanteric extension, I recommend an MRI of right hip to evaluate for intertrochanteric fracture extension which would necessitate surgical intervention.  If MRI demonstrates isolated greater trochanter fracture, patient can be WBAT with crutches/walker and rehab  [employer]

## 2023-07-15 NOTE — Progress Notes (Signed)
Orthopedic Tech Progress Note Patient Details:  [name] 1945-11-10 [id number]     Pt exceeds age for over head trapeze.  Patient ID: [name], female   DOB: 09/21/1946, 77 y.o.   MRN: [id number]  Klair Leising OTR/L 07/15/2023, 4:57 PM

## 2023-07-15 NOTE — ED Triage Notes (Signed)
Pt BIB EMS from Home. Pt fell Wednesday night. Hit her head and her left side. Pt c/o pain in the back of her head and L hip pain. Non ambulatory since injury. No blood thinners. No LOC.  BP 180/90 HR 77 Sp02 95% RA

## 2023-07-15 NOTE — Consult Note (Signed)
Orthopaedic Surgery consultation  CC: left hip fracture  HPI: This is a 77 year old female with history of ground level fall off toliet on Wednesday 07/13/23. Patient was unable to ambulate for the past two days and came to the ED today. Ambulates with walker ambulatory device due to severe knee pain from RA and is scheduled to have a TKA with Dr. Luiz Blare in October. Patient is having isolated hip pain at this time.   . Past Medical History:  Diagnosis Date   Anemia    Hx of    Arthritis    Calculus of bile duct without mention of cholecystitis or obstruction    Cataract    GERD (gastroesophageal reflux disease)    H. pylori infection    Hx of    Heart murmur    Hyperlipidemia    Hypertension    Hypothyroid    PUD (peptic ulcer disease)    Rheumatoid arthritis(714.0)     . No current facility-administered medications on file prior to encounter.   Current Outpatient Medications on File Prior to Encounter  Medication Sig Dispense Refill   amLODipine (NORVASC) 2.5 MG tablet Take 1 tablet (2.5 mg total) by mouth daily. 90 tablet 3   Ascorbic Acid (VITAMIN C) 1000 MG tablet Take 1,000 mg by mouth at bedtime.     aspirin 81 MG tablet Take 81 mg by mouth daily. 3 a week     Cholecalciferol (VITAMIN D PO) Take 5,000 Units by mouth See admin instructions. Takes 3 times a week     folic acid (FOLVITE) 1 MG tablet Take 3 mg by mouth daily.     gabapentin (NEURONTIN) 600 MG tablet TAKE 1/2 TO 1 TABLET 2 TO 3 TIMES DAILY AS NEEDED FOR PAIN 270 tablet 1   hydroxychloroquine (PLAQUENIL) 200 MG tablet Take 200 mg by mouth 2 (two) times daily.     ibuprofen (IBU) 800 MG tablet TAKE 1 TABLET 2-3 TIMES DAILY WITH FOOD FOR PAIN AND INFLAMMATION 270 tablet 3   levothyroxine (SYNTHROID) 50 MCG tablet Take  1 tablet  Daily  on an empty stomach with only water for 30 minutes & no Antacid meds, Calcium or Magnesium for 4 hours & avoid Biotin 90 tablet 1   magnesium gluconate (MAGONATE) 500 MG tablet Take  500 mg by mouth at bedtime. 3 times /week     methotrexate 50 MG/2ML injection Inject 25 mg into the skin once a week. Saturdays     olmesartan (BENICAR) 20 MG tablet TAKE 1 TABLET EVERY DAY FOR BLOOD PRESSURE 90 tablet 3   omeprazole (PRILOSEC) 40 MG capsule TAKE 1 CAPSULE TWICE DAILY TO PREVENT HEARTBURN AND INDIGESTION 180 capsule 3   traZODone (DESYREL) 150 MG tablet TAKE 1 TABLET 1 HOUR BEFORE BEDTIME AS NEEDED FOR SLEEP 90 tablet 3   zinc gluconate 50 MG tablet Take 50 mg by mouth daily. 3 times a week       . Allergies  Allergen Reactions   Ace Inhibitors Cough   Ambien [Zolpidem] Other (See Comments)    Odd Feeling   Crestor [Rosuvastatin]    Leflunomide Other (See Comments)   Pravastatin    Prozac [Fluoxetine Hcl] Other (See Comments)    Decreased libido   Zoloft [Sertraline Hcl]     . Past Surgical History:  Procedure Laterality Date   AV FISTULA REPAIR     BLADDER SUSPENSION     BREAST CYST EXCISION Left    BUNIONECTOMY  CARPAL TUNNEL RELEASE     CHOLECYSTECTOMY  09/07/2011   CYSTECTOMY     left breast   ERCP  09/29/2011   Procedure: ENDOSCOPIC RETROGRADE CHOLANGIOPANCREATOGRAPHY (ERCP);  Surgeon: Louis Meckel, MD;  Location: [facility];  Service: Endoscopy;  Laterality: N/A;   PILONIDAL CYST EXCISION     TENOLYSIS  10/26/2011   Procedure: TENDON SHEATH RELEASE/TENOLYSIS;  Surgeon: Nicki Reaper, MD;  Location: [facility];  Service: Orthopedics;  Laterality: Right;  tenosynovectomy removal superficialis slip right index finger   TONSILLECTOMY     TOTAL SHOULDER REPLACEMENT Right 02/22/2014   Dr. Malka So at [facility] RELEASE     VAGINAL HYSTERECTOMY     ovaries not removed     . Family History  Problem Relation Age of Onset   Lung cancer Mother    Stomach cancer Father    Heart attack Father        MI at age 28   Colon cancer Maternal Grandfather    Heart attack Brother        MI at age 75   Prostate cancer  Paternal Grandfather    Breast cancer Paternal Aunt      . Today's Vitals   07/15/23 1030 07/15/23 1043 07/15/23 1302 07/15/23 1352  BP:  (!) 163/111  (!) 150/74  Pulse:  74  76  Resp:  16  19  Temp:  98.7 F (37.1 C)  98.6 F (37 C)  TempSrc:    Oral  SpO2:  97%  93%  PainSc: 5   10-Worst pain ever    There is no height or weight on file to calculate BMI.  Marland Kitchen Recent Results (from the past 2160 hour(s))  CBC with Differential/Platelet     Status: Abnormal   Collection Time: 07/06/23 11:34 AM  Result Value Ref Range   WBC 5.9 3.8 - 10.8 Thousand/uL   RBC 3.96 3.80 - 5.10 Million/uL   Hemoglobin 12.1 11.7 - 15.5 g/dL   HCT 96.0 45.4 - 09.8 %   MCV 93.9 80.0 - 100.0 fL   MCH 30.6 27.0 - 33.0 pg   MCHC 32.5 32.0 - 36.0 g/dL   RDW 11.9 14.7 - 82.9 %   Platelets 155 140 - 400 Thousand/uL   MPV 13.0 (H) 7.5 - 12.5 fL   Neutro Abs 3,552 1,500 - 7,800 cells/uL   Lymphs Abs 1,269 850 - 3,900 cells/uL   Absolute Monocytes 649 200 - 950 cells/uL   Eosinophils Absolute 360 15 - 500 cells/uL   Basophils Absolute 71 0 - 200 cells/uL   Neutrophils Relative % 60.2 %   Total Lymphocyte 21.5 %   Monocytes Relative 11.0 %   Eosinophils Relative 6.1 %   Basophils Relative 1.2 %  COMPLETE METABOLIC PANEL WITH GFR     Status: Abnormal   Collection Time: 07/06/23 11:34 AM  Result Value Ref Range   Glucose, Bld 79 65 - 99 mg/dL    Comment: .            Fasting reference interval .    BUN 4 (L) 7 - 25 mg/dL   Creat 5.62 1.30 - 8.65 mg/dL   eGFR 87 > OR = 60 HQ/ION/6.29B2   BUN/Creatinine Ratio 6 6 - 22 (calc)   Sodium 142 135 - 146 mmol/L   Potassium 4.2 3.5 - 5.3 mmol/L   Chloride 105 98 - 110 mmol/L   CO2 30 20 - 32 mmol/L  Calcium 10.4 8.6 - 10.4 mg/dL   Total Protein 5.5 (L) 6.1 - 8.1 g/dL   Albumin 3.8 3.6 - 5.1 g/dL   Globulin 1.7 (L) 1.9 - 3.7 g/dL (calc)   AG Ratio 2.2 1.0 - 2.5 (calc)   Total Bilirubin 0.5 0.2 - 1.2 mg/dL   Alkaline phosphatase (APISO) 65 37 - 153 U/L    AST 24 10 - 35 U/L   ALT 13 6 - 29 U/L  Lipid panel     Status: None   Collection Time: 07/06/23 11:34 AM  Result Value Ref Range   Cholesterol 169 <200 mg/dL   HDL 76 > OR = 50 mg/dL   Triglycerides 55 <161 mg/dL   LDL Cholesterol (Calc) 79 mg/dL (calc)    Comment: Reference range: <100 . Desirable range <100 mg/dL for primary prevention;   <70 mg/dL for patients with CHD or diabetic patients  with > or = 2 CHD risk factors. Marland Kitchen LDL-C is now calculated using the Martin-Hopkins  calculation, which is a validated novel method providing  better accuracy than the Friedewald equation in the  estimation of LDL-C.  Horald Pollen et al. Lenox Ahr. 0960;454(09): 2061-2068  (http://education.[facility].com/faq/FAQ164)    Total CHOL/HDL Ratio 2.2 <5.0 (calc)   Non-HDL Cholesterol (Calc) 93 <811 mg/dL (calc)    Comment: For patients with diabetes plus 1 major ASCVD risk  factor, treating to a non-HDL-C goal of <100 mg/dL  (LDL-C of <91 mg/dL) is considered a therapeutic  option.   TSH     Status: None   Collection Time: 07/06/23 11:34 AM  Result Value Ref Range   TSH 1.54 0.40 - 4.50 mIU/L  Urinalysis, Routine w reflex microscopic     Status: None   Collection Time: 07/06/23 11:34 AM  Result Value Ref Range   Color, Urine YELLOW YELLOW   APPearance CLEAR CLEAR   Specific Gravity, Urine 1.006 1.001 - 1.035   pH 6.0 5.0 - 8.0   Glucose, UA NEGATIVE NEGATIVE   Bilirubin Urine NEGATIVE NEGATIVE   Ketones, ur NEGATIVE NEGATIVE   Hgb urine dipstick NEGATIVE NEGATIVE   Protein, ur NEGATIVE NEGATIVE   Nitrite NEGATIVE NEGATIVE   Leukocytes,Ua NEGATIVE NEGATIVE  Protime-INR     Status: None   Collection Time: 07/06/23 11:34 AM  Result Value Ref Range   INR 1.0     Comment: Reference Range                     0.9-1.1 Moderate-intensity Warfarin Therapy 2.0-3.0 Higher-intensity Warfarin Therapy   3.0-4.0  .    Prothrombin Time 11.0 9.0 - 11.5 sec    Comment: For additional  information, please refer to http://education.[facility].com/faq/FAQ104 (This link is being provided for informational/ educational purposes only.)   Basic metabolic panel     Status: Abnormal   Collection Time: 07/15/23 12:55 PM  Result Value Ref Range   Sodium 140 135 - 145 mmol/L   Potassium 3.6 3.5 - 5.1 mmol/L   Chloride 105 98 - 111 mmol/L   CO2 27 22 - 32 mmol/L   Glucose, Bld 69 (L) 70 - 99 mg/dL    Comment: Glucose reference range applies only to samples taken after fasting for at least 8 hours.   BUN 7 (L) 8 - 23 mg/dL   Creatinine, Ser 4.78 0.44 - 1.00 mg/dL   Calcium 9.6 8.9 - 29.5 mg/dL   GFR, Estimated >62 >13 mL/min    Comment: (NOTE) Calculated using the CKD-EPI Creatinine  Equation (2021)    Anion gap 8 5 - 15    Comment: Performed at [facility], [address] [place], [place] [address]  CBC with Differential     Status: Abnormal   Collection Time: 07/15/23 12:55 PM  Result Value Ref Range   WBC 10.4 4.0 - 10.5 K/uL   RBC 3.93 3.87 - 5.11 MIL/uL   Hemoglobin 12.3 12.0 - 15.0 g/dL   HCT 16.0 10.9 - 32.3 %   MCV 95.9 80.0 - 100.0 fL   MCH 31.3 26.0 - 34.0 pg   MCHC 32.6 30.0 - 36.0 g/dL   RDW 55.7 32.2 - 02.5 %   Platelets 111 (L) 150 - 400 K/uL   nRBC 0.0 0.0 - 0.2 %   Neutrophils Relative % 79 %   Neutro Abs 8.3 (H) 1.7 - 7.7 K/uL   Lymphocytes Relative 7 %   Lymphs Abs 0.7 0.7 - 4.0 K/uL   Monocytes Relative 6 %   Monocytes Absolute 0.6 0.1 - 1.0 K/uL   Eosinophils Relative 7 %   Eosinophils Absolute 0.8 (H) 0.0 - 0.5 K/uL   Basophils Relative 1 %   Basophils Absolute 0.1 0.0 - 0.1 K/uL   Immature Granulocytes 0 %   Abs Immature Granulocytes 0.04 0.00 - 0.07 K/uL    Comment: Performed at [facility], [address] [place], [place] [address]  Protime-INR     Status: None   Collection Time: 07/15/23 12:55 PM  Result Value Ref Range   Prothrombin Time 14.9 11.4 - 15.2 seconds   INR 1.1 0.8 - 1.2     Comment: (NOTE) INR goal varies based on device and disease states. Performed at [facility], [address] [place], [place] [address]   Type and screen [facility]     Status: None (Preliminary result)   Collection Time: 07/15/23 12:55 PM  Result Value Ref Range   ABO/RH(D) PENDING    Antibody Screen PENDING    Sample Expiration      07/18/2023,2359 Performed at [facility], [address], [place], [place] [address]   CBG monitoring, ED     Status: Abnormal   Collection Time: 07/15/23  3:41 PM  Result Value Ref Range   Glucose-Capillary 133 (H) 70 - 99 mg/dL    Comment: Glucose reference range applies only to samples taken after fasting for at least 8 hours.      Physical Exam: General: alert and interactive. No acute distress at this time.  HEENT: Trachea is midline Cardiovascular: regular rate and rhythm per palpation Respiratory: No increased work of breathing; symmetric chest rise.  MSK:  Left hip No openings or abrasions noted.  +TTP at hip. +log roll. No pain at knee or ankle. +motor DF/PF/EHL +sensation L4-S1. +2/4 pulses  Contralateral LE also evaluated. No pain at hip knee or ankle. ROM well preserved.   Upper extremities: no pain at shoulder girdles elbows wrists or hands. +motor M, U, R nn intact. +sensation C5-T1. +2/4 pulses.   Imaging: XR and MRI left hip and pelvis reviewed XR: Displaced proximal femur fracture involving the greater trochanter. This may represent an isolated greater trochanteric fracture, without a definite intertrochanteric component.  MRI independently reviewed: MRI demonstrates a greater trochanter fracture with non displaced fracture line extending into intertrochanteric region  Impression: left hip greater trochanter fracture with nondisplaced fracture extending into intertrochanteric region  Plan: Nonweight bearing to affected side.  Hospitalist  to risk stratify for  surgical fixation.  Hold DVT prophylaxis.  NPO IV fluids Discussed risks benefits and alterlnatives to fixation. Questions were sought and answered. Patient wishes to proceed.   Thornell Mule, MD Orthopaedic Surgery

## 2023-07-15 NOTE — Transfer of Care (Signed)
Immediate Anesthesia Transfer of Care Note  Patient: [name]  Procedure(s) Performed: INTRAMEDULLARY (IM) NAIL INTERTROCHANTERIC (Left)  Patient Location: PACU  Anesthesia Type:General  Level of Consciousness: awake and alert   Airway & Oxygen Therapy: Patient Spontanous Breathing and Patient connected to face mask oxygen  Post-op Assessment: Report given to RN and Post -op Vital signs reviewed and stable  Post vital signs: Reviewed and stable  Last Vitals:  Vitals Value Taken Time  BP 162/97 07/15/23 1945  Temp    Pulse 94 07/15/23 1946  Resp 21 07/15/23 1946  SpO2 96 % 07/15/23 1946  Vitals shown include unfiled device data.  Last Pain:  Vitals:   07/15/23 1700  TempSrc:   PainSc: 0-No pain      Patients Stated Pain Goal: 4 (07/15/23 1700)  Complications: No notable events documented.

## 2023-07-15 NOTE — Anesthesia Preprocedure Evaluation (Addendum)
Anesthesia Evaluation  Patient identified by MRN, date of birth, ID band Patient awake    Reviewed: Allergy & Precautions, NPO status , Patient's Chart, lab work & pertinent test results  Airway Mallampati: II  TM Distance: >3 FB Neck ROM: Full    Dental  (+) Edentulous Upper, Edentulous Lower   Pulmonary former smoker   Pulmonary exam normal        Cardiovascular hypertension, Pt. on medications Normal cardiovascular exam     Neuro/Psych  PSYCHIATRIC DISORDERS  Depression     Neuromuscular disease    GI/Hepatic Neg liver ROS, PUD,GERD  Medicated and Controlled,,  Endo/Other  Hypothyroidism    Renal/GU negative Renal ROS     Musculoskeletal  (+) Arthritis ,    Abdominal   Peds  Hematology negative hematology ROS (+)   Anesthesia Other Findings left hip intertrochanteric hip fracture  Reproductive/Obstetrics                             Anesthesia Physical Anesthesia Plan  ASA: 3 and emergent  Anesthesia Plan: General   Post-op Pain Management:    Induction: Intravenous  PONV Risk Score and Plan: 3 and Ondansetron, Dexamethasone and Treatment may vary due to age or medical condition  Airway Management Planned: Oral ETT  Additional Equipment:   Intra-op Plan:   Post-operative Plan: Extubation in OR  Informed Consent: I have reviewed the patients History and Physical, chart, labs and discussed the procedure including the risks, benefits and alternatives for the proposed anesthesia with the patient or authorized representative who has indicated his/her understanding and acceptance.     Dental advisory given  Plan Discussed with: CRNA  Anesthesia Plan Comments:         Anesthesia Quick Evaluation

## 2023-07-15 NOTE — ED Provider Notes (Signed)
[place] EMERGENCY DEPARTMENT AT [facility] Provider Note   CSN: [id number] Arrival date & time: 07/15/23  1015     History  Chief Complaint  Patient presents with   Fall   Hip Pain    [name] is a 77 y.o. female.  HPI    76 year old female with history of hypertension, atherosclerotic disease comes in with chief complaint of mechanical fall.  Patient has known history of left severe arthritis of the knee as well, she is supposed to get knee replacement done on the left side in October.  She states that 2 days ago she had a mechanical fall.  In general she has frequent falls.  Yesterday she was in pain, but did try to ambulate.  Today she is having excruciating pain and unable to ambulate.  Pain is primarily in the left hip.  Fall was now almost 2 days ago.  Patient denies any severe headache, new neurologic symptoms such as one-sided weakness, numbness, vision changes.  Patient is not on any blood thinners.  Home Medications Prior to Admission medications   Medication Sig Start Date End Date Taking? Authorizing Provider  amLODipine (NORVASC) 2.5 MG tablet Take 1 tablet (2.5 mg total) by mouth daily. 10/21/22   Raynelle Dick, NP  Ascorbic Acid (VITAMIN C) 1000 MG tablet Take 1,000 mg by mouth at bedtime.    [provider]  aspirin 81 MG tablet Take 81 mg by mouth daily. 3 a week    [provider]  Cholecalciferol (VITAMIN D PO) Take 5,000 Units by mouth See admin instructions. Takes 3 times a week    [provider]  folic acid (FOLVITE) 1 MG tablet Take 3 mg by mouth daily.    [provider]  gabapentin (NEURONTIN) 600 MG tablet TAKE 1/2 TO 1 TABLET 2 TO 3 TIMES DAILY AS NEEDED FOR PAIN 12/07/20   Lucky Cowboy, MD  hydroxychloroquine (PLAQUENIL) 200 MG tablet Take 200 mg by mouth 2 (two) times daily.    [provider]  ibuprofen (IBU) 800 MG tablet TAKE 1 TABLET 2-3 TIMES DAILY WITH FOOD FOR PAIN AND  INFLAMMATION 03/21/23   Lucky Cowboy, MD  levothyroxine (SYNTHROID) 50 MCG tablet Take  1 tablet  Daily  on an empty stomach with only water for 30 minutes & no Antacid meds, Calcium or Magnesium for 4 hours & avoid Biotin 01/23/23   Lucky Cowboy, MD  magnesium gluconate (MAGONATE) 500 MG tablet Take 500 mg by mouth at bedtime. 3 times /week    [provider]  methotrexate 50 MG/2ML injection Inject 25 mg into the skin once a week. Saturdays 04/16/21   [provider]  olmesartan (BENICAR) 20 MG tablet TAKE 1 TABLET EVERY DAY FOR BLOOD PRESSURE 03/05/23   Lucky Cowboy, MD  omeprazole (PRILOSEC) 40 MG capsule TAKE 1 CAPSULE TWICE DAILY TO PREVENT HEARTBURN AND INDIGESTION 01/13/23   Raynelle Dick, NP  traZODone (DESYREL) 150 MG tablet TAKE 1 TABLET 1 HOUR BEFORE BEDTIME AS NEEDED FOR SLEEP 03/03/22   Raynelle Dick, NP  zinc gluconate 50 MG tablet Take 50 mg by mouth daily. 3 times a week    [provider]      Allergies    Ace inhibitors, Ambien [zolpidem], Crestor [rosuvastatin], Leflunomide, Pravastatin, Prozac [fluoxetine hcl], and Zoloft [sertraline hcl]    Review of Systems   Review of Systems  All other systems reviewed and are negative.   Physical Exam Updated  Vital Signs BP (!) 150/74 Comment: nurse notified  Pulse 76   Temp 98.6 F (37 C) (Oral)   Resp 19   SpO2 93%  Physical Exam Vitals and nursing note reviewed.  Constitutional:      Appearance: She is well-developed.  HENT:     Head: Normocephalic and atraumatic.  Eyes:     Extraocular Movements: Extraocular movements intact.     Pupils: Pupils are equal, round, and reactive to light.  Neck:     Comments: No midline c-spine tenderness Cardiovascular:     Rate and Rhythm: Normal rate and regular rhythm.  Pulmonary:     Effort: Pulmonary effort is normal. No respiratory distress.     Breath sounds: Normal breath sounds.  Chest:     Chest wall: No tenderness.  Abdominal:      General: Bowel sounds are normal. There is no distension.     Palpations: Abdomen is soft.     Tenderness: There is no abdominal tenderness.  Musculoskeletal:        General: Tenderness present. No deformity.     Cervical back: Neck supple.     Comments: Patient has tenderness over the left hip and left knee No long bone tenderness - upper and lower extrmeities.    Skin:    General: Skin is warm and dry.     Findings: No rash.  Neurological:     Mental Status: She is alert and oriented to person, place, and time.     Cranial Nerves: No cranial nerve deficit.     ED Results / Procedures / Treatments   Labs (all labs ordered are listed, but only abnormal results are displayed) Labs Reviewed  BASIC METABOLIC PANEL  CBC WITH DIFFERENTIAL/PLATELET  PROTIME-INR  TYPE AND SCREEN    EKG None  Radiology DG Knee Complete 4 Views Left  Result Date: 07/15/2023 CLINICAL DATA:  Pain, fall. EXAM: LEFT KNEE - COMPLETE 4+ VIEW COMPARISON:  Remote left knee radiograph 09/20/2005 FINDINGS: No acute fracture or dislocation. There is mild lateral tibiofemoral joint space narrowing. Mild tricompartmental peripheral spurring. Minimal knee joint effusion. 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 This is unchanged from 2006 exam and definitively benign. No focal soft tissue abnormalities. IMPRESSION: 1. No acute fracture or dislocation of the left knee. 2. Mild tricompartmental osteoarthritis. Electronically Signed   By: Narda Rutherford M.D.   On: 07/15/2023 13:23   DG Hip Unilat With Pelvis 2-3 Views Left  Result Date: 07/15/2023 CLINICAL DATA:  Fall, pain. EXAM: DG HIP (WITH OR WITHOUT PELVIS) 2-3V LEFT COMPARISON:  None Available. FINDINGS: Displaced proximal femur fracture involving the greater trochanter. This may be isolated to the greater trochanter without intertrochanteric involvement. Femoral head is well seated. No hip  dislocation. Bony pelvis including pubic rami are intact. Pubic symphysis and sacroiliac joints are congruent. Mild bilateral hip degenerative change. IMPRESSION: Displaced proximal femur fracture involving the greater trochanter. This may represent an isolated greater trochanteric fracture, without a definite intertrochanteric component. Electronically Signed   By: Narda Rutherford M.D.   On: 07/15/2023 13:20    Procedures Procedures    Medications Ordered in ED Medications  fentaNYL (SUBLIMAZE) injection 50 mcg (50 mcg Intravenous Given 07/15/23 1311)  LORazepam (ATIVAN) tablet 0.5 mg (has no administration in time range)    ED Course/ Medical Decision Making/ A&P  Medical Decision Making Amount and/or Complexity of Data Reviewed Labs: ordered. Radiology: ordered.  Risk Prescription drug management. Decision regarding hospitalization.   77 year old patient comes in with chief complaint of mechanical fall.  Patient had a fall 2 days ago, now complaining of worsening pain over the left hip and difficulty to ambulate. She has no red flags suggesting elevated ICP and is not on any blood thinners.  Patient denies any neck pain.  I feel comfortable clearing the brain and C-spine clinically at this time.  Differential diagnosis for this patient includes left-sided hip fracture, knee fracture, hip contusion, soft tissue injury, sprain-strain.  I ordered x-ray of the hip, and independently interpreted.  Patient has evidence of greater trochanteric fracture.  I consulted orthopedic surgery and spoke with [facility] physician, who patient is following.  They recommend that we get MRI of the hip.  There is a possibility that patient might not need surgical repair if the MRI is reassuring.  Patient will need admission to the hospital regardless because of hip fracture and pain control.  Final Clinical Impression(s) / ED Diagnoses Final  diagnoses:  Closed fracture of left hip, initial encounter [facility])    Rx / DC Orders ED Discharge Orders     None         Derwood Kaplan, MD 07/15/23 1404

## 2023-07-15 NOTE — Anesthesia Procedure Notes (Signed)
Procedure Name: Intubation Date/Time: 07/15/2023 6:11 PM  Performed by: Epimenio Sarin, CRNAPre-anesthesia Checklist: Patient identified, Emergency Drugs available, Suction available, Patient being monitored and Timeout performed Patient Re-evaluated:Patient Re-evaluated prior to induction Oxygen Delivery Method: Circle system utilized Preoxygenation: Pre-oxygenation with 100% oxygen Induction Type: IV induction Ventilation: Mask ventilation without difficulty and Oral airway inserted - appropriate to patient size Laryngoscope Size: Mac and 3 Grade View: Grade I Tube type: Oral Tube size: 7.0 mm Number of attempts: 1 Airway Equipment and Method: Stylet Placement Confirmation: ETT inserted through vocal cords under direct vision, positive ETCO2 and breath sounds checked- equal and bilateral Secured at: 21 cm Tube secured with: Tape Dental Injury: Teeth and Oropharynx as per pre-operative assessment

## 2023-07-16 DIAGNOSIS — I700 Atherosclerosis of aorta: Secondary | ICD-10-CM | POA: Diagnosis not present

## 2023-07-16 DIAGNOSIS — S72102A Unspecified trochanteric fracture of left femur, initial encounter for closed fracture: Secondary | ICD-10-CM | POA: Diagnosis not present

## 2023-07-16 DIAGNOSIS — I10 Essential (primary) hypertension: Secondary | ICD-10-CM | POA: Diagnosis not present

## 2023-07-16 DIAGNOSIS — M069 Rheumatoid arthritis, unspecified: Secondary | ICD-10-CM

## 2023-07-16 DIAGNOSIS — E782 Mixed hyperlipidemia: Secondary | ICD-10-CM

## 2023-07-16 DIAGNOSIS — E039 Hypothyroidism, unspecified: Secondary | ICD-10-CM

## 2023-07-16 DIAGNOSIS — K219 Gastro-esophageal reflux disease without esophagitis: Secondary | ICD-10-CM

## 2023-07-16 LAB — COMPREHENSIVE METABOLIC PANEL
ALT: 17 U/L (ref 0–44)
AST: 22 U/L (ref 15–41)
Albumin: 2.8 g/dL — ABNORMAL LOW (ref 3.5–5.0)
Alkaline Phosphatase: 53 U/L (ref 38–126)
Anion gap: 7 (ref 5–15)
BUN: 8 mg/dL (ref 8–23)
CO2: 25 mmol/L (ref 22–32)
Calcium: 8.9 mg/dL (ref 8.9–10.3)
Chloride: 101 mmol/L (ref 98–111)
Creatinine, Ser: 0.63 mg/dL (ref 0.44–1.00)
GFR, Estimated: >60 mL/min (ref >60)
Glucose, Bld: 204 mg/dL — ABNORMAL HIGH (ref 70–99)
Potassium: 4.1 mmol/L (ref 3.5–5.1)
Sodium: 133 mmol/L — ABNORMAL LOW (ref 135–145)
Total Bilirubin: 0.9 mg/dL (ref 0.3–1.2)
Total Protein: 4.9 g/dL — ABNORMAL LOW (ref 6.5–8.1)

## 2023-07-16 LAB — GLUCOSE, CAPILLARY
Glucose-Capillary: 128 mg/dL — ABNORMAL HIGH (ref 70–99)
Glucose-Capillary: 136 mg/dL — ABNORMAL HIGH (ref 70–99)
Glucose-Capillary: 168 mg/dL — ABNORMAL HIGH (ref 70–99)
Glucose-Capillary: 172 mg/dL — ABNORMAL HIGH (ref 70–99)
Glucose-Capillary: 185 mg/dL — ABNORMAL HIGH (ref 70–99)
Glucose-Capillary: 191 mg/dL — ABNORMAL HIGH (ref 70–99)

## 2023-07-16 LAB — CBC
HCT: 30.9 % — ABNORMAL LOW (ref 36.0–46.0)
Hemoglobin: 9.8 g/dL — ABNORMAL LOW (ref 12.0–15.0)
MCH: 30.6 pg (ref 26.0–34.0)
MCHC: 31.7 g/dL (ref 30.0–36.0)
MCV: 96.6 fL (ref 80.0–100.0)
Platelets: 88 10*3/uL — ABNORMAL LOW (ref 150–400)
RBC: 3.2 MIL/uL — ABNORMAL LOW (ref 3.87–5.11)
RDW: 13 % (ref 11.5–15.5)
WBC: 8.4 10*3/uL (ref 4.0–10.5)
nRBC: 0 % (ref 0.0–0.2)

## 2023-07-16 LAB — SURGICAL PCR SCREEN
MRSA, PCR: NEGATIVE
Staphylococcus aureus: POSITIVE — AB

## 2023-07-16 MED ORDER — ENSURE ENLIVE PO LIQD
237.0000 mL | Freq: Two times a day (BID) | ORAL | Status: DC
  Administered 2023-07-16: 237 mL via ORAL

## 2023-07-16 MED ORDER — ADULT MULTIVITAMIN W/MINERALS CH
1.0000 | ORAL_TABLET | Freq: Every day | ORAL | Status: DC
  Administered 2023-07-16 – 2023-07-17 (×2): 1 via ORAL
  Filled 2023-07-16 (×2): qty 1

## 2023-07-16 MED ORDER — ASPIRIN 81 MG PO TABS: 81.0000 mg | ORAL_TABLET | Freq: Two times a day (BID) | ORAL | 0 refills | Status: DC

## 2023-07-16 MED ORDER — MUPIROCIN 2 % EX OINT
1.0000 | TOPICAL_OINTMENT | Freq: Two times a day (BID) | CUTANEOUS | Status: DC
  Administered 2023-07-16 (×2): 1 via NASAL
  Filled 2023-07-16: qty 22

## 2023-07-16 MED ORDER — HYDROCODONE-ACETAMINOPHEN 5-325 MG PO TABS: 1.0000 | ORAL_TABLET | Freq: Four times a day (QID) | ORAL | 0 refills | Status: DC | PRN

## 2023-07-16 MED ORDER — HYDRALAZINE HCL 20 MG/ML IJ SOLN: 10.0000 mg | Freq: Four times a day (QID) | INTRAMUSCULAR | Status: DC | PRN

## 2023-07-16 MED ORDER — CHLORHEXIDINE GLUCONATE CLOTH 2 % EX PADS
6.0000 | MEDICATED_PAD | Freq: Every day | CUTANEOUS | Status: DC
  Administered 2023-07-16: 6 via TOPICAL

## 2023-07-16 NOTE — Progress Notes (Signed)
Subjective: 1 Day Post-Op Procedure(s) (LRB): INTRAMEDULLARY (IM) NAIL INTERTROCHANTERIC (Left)  Patient doing well this morning. She is resting comfortably in bed. Husband is bedside.    Activity level:  wbat Diet tolerance:  ok Voiding:  ok Patient reports pain as mild and moderate.    Objective: Vital signs in last 24 hours: Temp:  [97.2 F (36.2 C)-98.9 F (37.2 C)] 97.9 F (36.6 C) (09/07 0637) Pulse Rate:  [62-95] 62 (09/07 0637) Resp:  [16-21] 17 (09/07 0637) BP: (112-179)/(69-111) 133/71 (09/07 0637) SpO2:  [93 %-99 %] 93 % (09/07 0637) Weight:  [64.9 kg] 64.9 kg (09/06 1700)  Labs: Recent Labs    07/15/23 1255 07/16/23 0355  HGB 12.3 9.8*   Recent Labs    07/15/23 1255 07/16/23 0355  WBC 10.4 8.4  RBC 3.93 3.20*  HCT 37.7 30.9*  PLT 111* 88*   Recent Labs    07/15/23 1255 07/16/23 0355  NA 140 133*  K 3.6 4.1  CL 105 101  CO2 27 25  BUN 7* 8  CREATININE 0.75 0.63  GLUCOSE 69* 204*  CALCIUM 9.6 8.9   Recent Labs    07/15/23 1255  INR 1.1    Physical Exam:  Neurologically intact ABD soft Neurovascular intact Sensation intact distally Intact pulses distally Dorsiflexion/Plantar flexion intact Incision: dressing C/D/I and no drainage No cellulitis present Compartment soft  Assessment/Plan:  1 Day Post-Op Procedure(s) (LRB): INTRAMEDULLARY (IM) NAIL INTERTROCHANTERIC (Left) Advance diet Up with therapy Discharge to SNF once cleared by medicine and PT. We greatly appreciate medical management. Weightbearing as tolerated 81mg  asa BID for DVT prevention. Follow up in office 2 weeks post op.   Ginger Organ Keyaria Lawson 07/16/2023, 9:14 AM

## 2023-07-16 NOTE — Evaluation (Signed)
Occupational Therapy Evaluation Patient Details Name: [name] MRN: [id number] DOB: 1946/02/18 Today's Date: 07/16/2023   History of Present Illness 77 yr old female admitted with L hip fracture  after falling at home. S/p IM nail 07/15/23. Hx of anemia, OA, RA, R TSA 2015   Clinical Impression   Pt is currently presenting below her baseline level of function for ADL management, given the below listed deficits (see OT problem list). She currently requires assist for tasks, including, lower body dressing, sit to stand with RW, and toileting. She reports moderate L LE pain. She will benefit from further OT services to maximize her safety and independence with self care tasks and to facilitate her safe return home. OT anticipates she will be okay to return home with home health OT and family assist upon hospital discharge.       If plan is discharge home, recommend the following: A little help with walking and/or transfers;A little help with bathing/dressing/bathroom;Assistance with cooking/housework;Assist for transportation;Help with stairs or ramp for entrance    Functional Status Assessment  Patient has had a recent decline in their functional status and demonstrates the ability to make significant improvements in function in a reasonable and predictable amount of time.  Equipment Recommendations  Other (comment) (rolling walker and tub bench)    Recommendations for Other Services       Precautions / Restrictions Precautions Precautions: Fall Precaution Comments: husband reports LE sometimes buckle Restrictions Weight Bearing Restrictions: No LLE Weight Bearing: Weight bearing as tolerated      Mobility Bed Mobility Overal bed mobility: Needs Assistance Bed Mobility: Supine to Sit, Sit to Supine     Supine to sit: Contact guard, HOB elevated, Used rails Sit to supine: Supervision, Used rails        Transfers Overall transfer level: Needs assistance Equipment  used: Rolling walker (2 wheels) Transfers: Sit to/from Stand Sit to Stand: Min assist, From elevated surface                  ADL either performed or assessed with clinical judgement   ADL Overall ADL's : Needs assistance/impaired;Independent Eating/Feeding: Independent;Sitting Eating/Feeding Details (indicate cue type and reason): based on clinical judgement Grooming: Set up;Sitting Grooming Details (indicate cue type and reason): simulated seated EOB         Upper Body Dressing : Set up;Sitting;Supervision/safety   Lower Body Dressing: Minimal assistance;Moderate assistance;Sit to/from stand   Toilet Transfer: Minimal assistance;Contact guard assist;Ambulation;Rolling walker (2 wheels) Toilet Transfer Details (indicate cue type and reason): at bathroom level, based on clinical judgement Toileting- Clothing Manipulation and Hygiene: Minimal assistance;Moderate assistance Toileting - Clothing Manipulation Details (indicate cue type and reason): at bathroom level based on clinical judgement             Vision   Additional Comments: She correctly read the time depicted on the wall clock.            Pertinent Vitals/Pain Pain Assessment Pain Assessment: 0-10 Pain Score: 6  Pain Location: L hip Pain Descriptors / Indicators: Operative site guarding Pain Intervention(s): Limited activity within patient's tolerance, Monitored during session, Repositioned     Extremity/Trunk Assessment Upper Extremity Assessment Upper Extremity Assessment: Overall WFL for tasks assessed;RUE deficits/detail   Lower Extremity Assessment Lower Extremity Assessment: Generalized weakness     Communication Communication Communication: No apparent difficulties   Cognition Arousal: Alert Behavior During Therapy: WFL for tasks assessed/performed Overall Cognitive Status: Impaired/Different from baseline Area of Impairment: Orientation, Problem solving  Following  Commands: Follows one step commands with slightly increased time     Problem Solving: Requires verbal cues General Comments: pt with acute confusion, though not severe; pt's spouse attributes this to recent medication and anesthesia from surgery. She was disoriented to city and month. She required intermittent assist for problem solving and memory/recall. Able to follow simple commands consistently                Home Living Family/patient expects to be discharged to:: Private residence Living Arrangements: Spouse/significant other   Type of Home: Nutritional therapist of Steps: 2   Home Layout: One level     Bathroom Shower/Tub: Tub/shower unit         Home Equipment: Rollator (4 wheels)          Prior Functioning/Environment Prior Level of Function : Independent/Modified Independent             Mobility Comments:  (She used a rollator for ambulation.) ADLs Comments:  (She was independent with ADLs, cooking, cleaning, and driving.)        OT Problem List: Decreased strength;Impaired balance (sitting and/or standing);Decreased knowledge of use of DME or AE;Pain      OT Treatment/Interventions: Self-care/ADL training;Therapeutic exercise;Energy conservation;DME and/or AE instruction;Therapeutic activities;Balance training;Patient/family education    OT Goals(Current goals can be found in the care plan section) Acute Rehab OT Goals Patient Stated Goal: to return home soon OT Goal Formulation: With patient Time For Goal Achievement: 07/30/23 Potential to Achieve Goals: Good ADL Goals Pt Will Perform Grooming: with supervision;standing Pt Will Perform Lower Body Dressing: with supervision;sitting/lateral leans;sit to/from stand Pt Will Transfer to Toilet: with supervision;ambulating;grab bars Pt Will Perform Toileting - Clothing Manipulation and hygiene: with supervision;sit to/from stand  OT Frequency: Min 1X/week       AM-PAC OT "6 Clicks" Daily  Activity     Outcome Measure Help from another person eating meals?: None Help from another person taking care of personal grooming?: A Little Help from another person toileting, which includes using toliet, bedpan, or urinal?: A Little Help from another person bathing (including washing, rinsing, drying)?: A Lot Help from another person to put on and taking off regular upper body clothing?: A Little Help from another person to put on and taking off regular lower body clothing?: A Lot 6 Click Score: 17   End of Session Equipment Utilized During Treatment: Gait belt;Rolling walker (2 wheels) Nurse Communication: Mobility status  Activity Tolerance: Patient tolerated treatment well Patient left: in bed;with call bell/phone within reach;with bed alarm set;with family/visitor present  OT Visit Diagnosis: Unsteadiness on feet (R26.81);Pain;Muscle weakness (generalized) (M62.81) Pain - Right/Left: Left Pain - part of body: Hip                Time: 1478-2956 OT Time Calculation (min): 22 min Charges:  OT General Charges $OT Visit: 1 Visit OT Evaluation $OT Eval Low Complexity: 1 Low    Mardella Nuckles L Zhania Shaheen, OTR/L 07/16/2023, 4:56 PM

## 2023-07-16 NOTE — Progress Notes (Signed)
PROGRESS NOTE    [name]  ION:[id number] DOB: 30-Mar-1946 DOA: 07/15/2023 PCP: Lucky Cowboy, MD   Brief Narrative:  The patient is a 77 year old overweight Caucasian female with a past medical history significant for mellitus anemia, osteoarthritis, cholelithiasis, cataracts, GERD, peptic ulcer disease and history of H. pylori infection, heart murmur, hypertension, hyperlipidemia, hypothyroidism, rheumatoid arthritis as well as other comorbidities who presented to the emergency room with complaints of left hip pain after she sustained a mechanical fall after her knee buckled while transferring up from the toilet.  She has had multiple falls recently and developed immediate pain when she fell but was able to ambulate.  However since then the pain has become so intense that she has now having difficulty bearing weight on the left hip.  She is brought to the emergency room and left hip x-ray done and showed a displaced proximal femur fracture involving the greater trochanter which may be isolated since there is no definitive intertrochanteric component.  She also had left knee x-ray which showed no acute fracture or dislocation but did show tricompartmental osteoarthritis.  She was admitted and orthopedic surgery was consulted and she was taken for surgical intervention and is postoperative day 1 now.  PT OT to further evaluate and treat  Assessment and Plan:  Intertrochanteric fracture of left femur (HCC) -Admit to telemetry/inpatient. -Ice area as needed. -Buck's traction per protocol. -Analgesics as needed. -Antiemetics as needed. -Consult TOC team. -Consult nutritional services. -PT evaluation after surgery. -Orthopedic surgery evaluation appreciated and she underwent intramedullary nailing of the left intertrochanteric hip fracture -She is postoperative day 1 -Orthopedic surgery recommended weightbearing as tolerated as well as DVT prophylaxis Lovenox 40 mg subcu daily until  mobilizing then however have now changed to aspirin 81 mg twice daily for DVT prevention considering transition in clinic to alternative medications -Patient to follow-up with orthopedic surgery about 14 days for incision check -They are recommending up with therapy -Will initiate bowel regimen as well   Essential Hypertension -No longer taking antihypertensive with Amlodipine or Olmesartan -Continue to monitor blood pressures per protocol -Start IV Hydralazine 10 mg q6hprn for SBP >160 or DBP >100 -Last blood pressure reading was a little on the higher side at 162/92   Hyperlipidemia, mixed -Aortic atherosclerosis (HCC) by CXR in 2021 -On lifestyle modifications only. -The patient is on methotrexate. -Patient to discuss this with her PCP.   Rheumatoid Arthritis (HCC)  -On weekly methotrexate. -Continue Hydroxychloroquine 200 mg p.o. twice daily. -C/w Pain Control with acetaminophen 650 mg p.o./RC every 6 as needed for mild pain or fever, Oxycodone 5 mg p.o. every 6 as needed for breakthrough pain, and IV Fentanyl 25 mcg IV every 2 as needed for severe pain  Chronic Diastolic CHF -Strict I's and O's and daily weights will need to continue monitor for signs and symptoms of volume overload  Intake/Output Summary (Last 24 hours) at 07/16/2023 1331 Last data filed at 07/16/2023 1226 Gross per 24 hour  Intake 2678.14 ml  Output 1150 ml  Net 1528.14 ml  -Discontinue IV fluid hydration   Gastroesophageal Reflux Disease without Esophagitis/GI Prophylaxis -Continue Omeprazole substitution with formulary equivalent of Pantoprazole 40 mg po Daily    Hypothyroidism -Continue Levothyroxine 88 mcg p.o. daily.  Hyponatremia -Mild. Na+ Trend: Recent Labs  Lab 07/06/23 1134 07/15/23 1255 07/16/23 0355  NA 142 140 133*  -Continue to Monitor and Trend and Repeat CMP in the AM   Normocytic Anemia -Hgb/Hct Trend: Recent Labs  Lab 07/06/23  1134 07/15/23 1255 07/16/23 0355  HGB 12.1 12.3  9.8*  HCT 37.2 37.7 30.9*  MCV 93.9 95.9 96.6  -Check Anemia Panel in the AM -Continue to Monitor for S/Sx of Bleeding; No overt bleeding noted -Repeat CBC in the AM  Thrombocytopenia -Platelet Count Trend: Recent Labs  Lab 07/06/23 1134 07/15/23 1255 07/16/23 0355  PLT 155 111* 88*  -Continue to Monitor for S/Sx of Bleeding; No overt bleeding noted -Repeat CBC in the AM   Hypoalbuminemia -Patient's Albumin Trend: Recent Labs  Lab 07/16/23 0355  ALBUMIN 2.8*  -Continue to Monitor and Trend and repeat CMP in the AM  Overweight -Complicates overall prognosis and care -Estimated body mass index is 27.03 kg/m as calculated from the following:   Height as of this encounter: 5\' 1"  (1.549 m).   Weight as of this encounter: 64.9 kg.  -Weight Loss and Dietary Counseling given   DVT prophylaxis: SCDs Start: 07/15/23 1601    Code Status: Full Code Family Communication: No family currently at bedside  Disposition Plan:  Level of care: Telemetry Status is: Inpatient Remains inpatient appropriate because: Needs further clinical evaluation by PT and OT for safe discharge disposition   Consultants:  Orthopedic surgery  Procedures:  Left hip intramedullary nailing done by Dr. Hulda Humphrey  Antimicrobials:  Anti-infectives (From admission, onward)    Start     Dose/Rate Route Frequency Ordered Stop   07/16/23 1000  hydroxychloroquine (PLAQUENIL) tablet 200 mg        200 mg Oral 2 times daily 07/15/23 1910     07/15/23 1645  ceFAZolin (ANCEF) IVPB 2g/100 mL premix        2 g 200 mL/hr over 30 Minutes Intravenous On call to O.R. 07/15/23 1640 07/15/23 1822   07/15/23 1641  ceFAZolin (ANCEF) 2-4 GM/100ML-% IVPB       Note to Pharmacy: Minor, Anneita S: cabinet override      07/15/23 1641 07/15/23 1830       Subjective: Seen and examined at bedside and she is doing fairly well after surgery and states that she has no pain.  No nausea or vomiting.  No new lightheadedness or  dizziness.  Feels okay.  No other concerns or complaints at time.  Objective: Vitals:   07/15/23 2257 07/16/23 0206 07/16/23 0637 07/16/23 1127  BP: 112/69 124/69 133/71 (!) 162/92  Pulse: 72 65 62 79  Resp: 17 17 17 18   Temp: 97.6 F (36.4 C) 97.6 F (36.4 C) 97.9 F (36.6 C) 97.7 F (36.5 C)  TempSrc: Oral   Oral  SpO2: 97% 99% 93% 94%  Weight:      Height:        Intake/Output Summary (Last 24 hours) at 07/16/2023 1359 Last data filed at 07/16/2023 1226 Gross per 24 hour  Intake 2678.14 ml  Output 1150 ml  Net 1528.14 ml   Filed Weights   07/15/23 1700  Weight: 64.9 kg   Examination: Physical Exam:  Constitutional: WN/WD overweight Caucasian female in no acute distress Respiratory: Diminished to auscultation bilaterally, no wheezing, rales, rhonchi or crackles. Normal respiratory effort and patient is not tachypenic. No accessory muscle use.  Unlabored breathing Cardiovascular: RRR, no murmurs / rubs / gallops. S1 and S2 auscultated. No extremity edema.  Abdomen: Soft, non-tender, distended secondary to body habitus. Bowel sounds positive.  GU: Deferred. Musculoskeletal: No clubbing / cyanosis of digits/nails. No joint deformity upper and lower extremities.  Skin: No rashes, lesions, ulcers on a limited skin evaluation. No  induration; Warm and dry.  Neurologic: CN 2-12 grossly intact with no focal deficits. Romberg sign and cerebellar reflexes not assessed.  Psychiatric: Normal judgment and insight. Alert and oriented x 3. Normal mood and appropriate affect.   Data Reviewed: I have personally reviewed following labs and imaging studies  CBC: Recent Labs  Lab 07/15/23 1255 07/16/23 0355  WBC 10.4 8.4  NEUTROABS 8.3*  --   HGB 12.3 9.8*  HCT 37.7 30.9*  MCV 95.9 96.6  PLT 111* 88*   Basic Metabolic Panel: Recent Labs  Lab 07/15/23 1255 07/16/23 0355  NA 140 133*  K 3.6 4.1  CL 105 101  CO2 27 25  GLUCOSE 69* 204*  BUN 7* 8  CREATININE 0.75 0.63   CALCIUM 9.6 8.9   GFR: Estimated Creatinine Clearance: 51.6 mL/min (by C-G formula based on SCr of 0.63 mg/dL). Liver Function Tests: Recent Labs  Lab 07/16/23 0355  AST 22  ALT 17  ALKPHOS 53  BILITOT 0.9  PROT 4.9*  ALBUMIN 2.8*   No results for input(s): "LIPASE", "AMYLASE" in the last 168 hours. No results for input(s): "AMMONIA" in the last 168 hours. Coagulation Profile: Recent Labs  Lab 07/15/23 1255  INR 1.1   Cardiac Enzymes: No results for input(s): "CKTOTAL", "CKMB", "CKMBINDEX", "TROPONINI" in the last 168 hours. BNP (last 3 results) No results for input(s): "PROBNP" in the last 8760 hours. HbA1C: No results for input(s): "HGBA1C" in the last 72 hours. CBG: Recent Labs  Lab 07/15/23 2207 07/16/23 0016 07/16/23 0416 07/16/23 0857 07/16/23 1137  GLUCAP 131* 168* 185* 172* 191*   Lipid Profile: No results for input(s): "CHOL", "HDL", "LDLCALC", "TRIG", "CHOLHDL", "LDLDIRECT" in the last 72 hours. Thyroid Function Tests: No results for input(s): "TSH", "T4TOTAL", "FREET4", "T3FREE", "THYROIDAB" in the last 72 hours. Anemia Panel: No results for input(s): "VITAMINB12", "FOLATE", "FERRITIN", "TIBC", "IRON", "RETICCTPCT" in the last 72 hours. Sepsis Labs: No results for input(s): "PROCALCITON", "LATICACIDVEN" in the last 168 hours.  Recent Results (from the past 240 hour(s))  Surgical PCR screen     Status: Abnormal   Collection Time: 07/15/23  5:22 PM   Specimen: Nasal Mucosa; Nasal Swab  Result Value Ref Range Status   MRSA, PCR NEGATIVE NEGATIVE Final   Staphylococcus aureus POSITIVE (A) NEGATIVE Final    Comment: (NOTE) The Xpert SA Assay (FDA approved for NASAL specimens in patients 64 years of age and older), is one component of a comprehensive surveillance program. It is not intended to diagnose infection nor to guide or monitor treatment. Performed at [facility], [address], [place], [place] [address]      Radiology Studies: DG FEMUR MIN 2 VIEWS LEFT  Result Date: 07/15/2023 CLINICAL DATA:  Left hip ORIF, intraoperative examination EXAM: LEFT FEMUR 2 VIEWS COMPARISON:  MRI 07/15/2023 FINDINGS: Two fluoroscopic intraoperative radiographs of the reported left hip demonstrates surgical changes of left hip ORIF utilizing a short stem intramedullary rod with proximal and distal interlocking screws. Fracture fragments are in anatomic alignment. No dislocation. Fluoroscopic images: 2 Fluoroscopic time: 46 seconds Fluoroscopic dose: 13.23 mGy IMPRESSION: 1. Left hip ORIF. Electronically Signed   By: Helyn Numbers M.D.   On: 07/15/2023 23:35   DG C-Arm 1-60 Min-No Report  Result Date: 07/15/2023 Fluoroscopy was utilized by the requesting physician.  No radiographic interpretation.   DG C-Arm 1-60 Min-No Report  Result Date: 07/15/2023 Fluoroscopy was utilized by the requesting physician.  No radiographic interpretation.   MR HIP LEFT  WO CONTRAST  Result Date: 07/15/2023 CLINICAL DATA:  Evaluate proximal left femur fracture EXAM: MR OF THE LEFT HIP WITHOUT CONTRAST TECHNIQUE: Multiplanar, multisequence MR imaging was performed. No intravenous contrast was administered. COMPARISON:  X-ray 07/15/2023 FINDINGS: Bones/Joint/Cartilage Acute comminuted fracture of the greater trochanter of the proximal left femur. Fracture fragments are mildly distracted. There is a nondisplaced trabecular fracture traversing partially through the intertrochanteric region (series 8, images 19-21). Surrounding bone marrow edema. No fracture extension into the femoral head or neck. Hip joint alignment is maintained without dislocation. No additional fractures. No marrow replacing bone lesion. Bilateral hip joint spaces are maintained. Small left hip joint effusion. No femoral head avascular necrosis. SI joints and pubic symphysis demonstrate mild degenerative changes. Advanced lower lumbar spondylosis, incompletely assessed. Ligaments  Grossly intact. Muscles and Tendons Osseous avulsion of the gluteus medius tendon attachment site at the left greater trochanter. Extensive intramuscular edema within the left gluteus medius and left piriform is muscles. There is also intramuscular edema within the proximal adductor and anterior compartment musculature of the right thigh. There is tendinosis and partial tearing of the bilateral hamstring tendon origins. Soft tissues Soft tissue edema and ill-defined fluid at the fracture site. No inguinal or intrapelvic lymphadenopathy. Sigmoid diverticulosis. IMPRESSION: 1. Acute comminuted fracture of the greater trochanter of the proximal left femur. Nondisplaced trabecular fracture extends through the intertrochanteric region. 2. Osseous avulsion of the left gluteus medius tendon attachment site at the left greater trochanter withextensive intramuscular edema within the gluteus medius and piriformis muscles. 3. Tendinosis and partial tearing of the bilateral hamstring tendon origins. Electronically Signed   By: Duanne Guess D.O.   On: 07/15/2023 16:35   DG Knee Complete 4 Views Left  Result Date: 07/15/2023 CLINICAL DATA:  Pain, fall. EXAM: LEFT KNEE - COMPLETE 4+ VIEW COMPARISON:  Remote left knee radiograph 09/20/2005 FINDINGS: No acute fracture or dislocation. There is mild lateral tibiofemoral joint space narrowing. Mild tricompartmental peripheral spurring. Minimal knee joint effusion. 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 This is unchanged from 2006 exam and definitively benign. No focal soft tissue abnormalities. IMPRESSION: 1. No acute fracture or dislocation of the left knee. 2. Mild tricompartmental osteoarthritis. Electronically Signed   By: Narda Rutherford M.D.   On: 07/15/2023 13:23   DG Hip Unilat With Pelvis 2-3 Views Left  Result Date: 07/15/2023 CLINICAL DATA:  Fall, pain. EXAM: DG HIP (WITH OR WITHOUT PELVIS) 2-3V  LEFT COMPARISON:  None Available. FINDINGS: Displaced proximal femur fracture involving the greater trochanter. This may be isolated to the greater trochanter without intertrochanteric involvement. Femoral head is well seated. No hip dislocation. Bony pelvis including pubic rami are intact. Pubic symphysis and sacroiliac joints are congruent. Mild bilateral hip degenerative change. IMPRESSION: Displaced proximal femur fracture involving the greater trochanter. This may represent an isolated greater trochanteric fracture, without a definite intertrochanteric component. Electronically Signed   By: Narda Rutherford M.D.   On: 07/15/2023 13:20    Scheduled Meds:  Chlorhexidine Gluconate Cloth  6 each Topical Q0600   feeding supplement  237 mL Oral BID BM   gabapentin  300 mg Oral QPM   hydroxychloroquine  200 mg Oral BID   levothyroxine  88 mcg Oral QAC breakfast   magnesium gluconate  500 mg Oral Q M,W,F-2000   multivitamin with minerals  1 tablet Oral Daily   mupirocin ointment  1 Application Nasal BID   pantoprazole  40  mg Oral Daily   Continuous Infusions:   LOS: 1 day   Marguerita Merles, DO [facility] Available via Epic secure chat 7am-7pm After these hours, please refer to coverage provider listed on [email or web address] 07/16/2023, 1:59 PM

## 2023-07-16 NOTE — Plan of Care (Signed)

## 2023-07-16 NOTE — Op Note (Signed)
.  Orthopaedic Surgery Operative Note (CSN: [id number])  [name]  [birth year] Date of Surgery: 07/15/2023   Diagnoses:  left hip intertrochanteric hip fracture  Procedure: Left hip intramedullary nailing   Operative Finding Successful completion of the planned procedure.  Post-operative plan: The patient will be WBAT. DVT prophylaxis Lovenox 40 mg/day until mobilizing and then consider transition in clinic to alternative medicines.   Pain control with PRN pain medication preferring oral medicines.  Follow up plan will be scheduled in approximately 14 days for incision check.  Post-Op Diagnosis: Same Surgeons:Primary: Luci Bank, MD Assistants:Caroline McBane PA-C Location: [facility] ROOM 09 Anesthesia: General Antibiotics: Ancef 2 g Tourniquet time: N/A Estimated Blood Loss: 150cc Complications: None Specimens: None Implants: Implant Name Type Inv. Item Serial No. Manufacturer Lot No. LRB No. Used Action  NAIL INTERTAN 10X18 130D 10S - [id number] Nail NAIL INTERTAN 10X18 130D 10S  [employer] [id number] Left 1 Implanted  SCREW LAG COMPR KIT 95/90 - [id number] Screw SCREW LAG COMPR KIT 95/90  [employer] [id number] Left 1 Implanted  SCREW TRIGEN LOW PROF 5.0X35 - [id number] Screw SCREW TRIGEN LOW PROF 5.0X35  [employer] [id number] Left 1 Implanted    Indications for Surgery:   [name] is a 77 y.o. female with a left greater trochanter fracture with MRI demonstrating intertrochanteric extension.  Benefits and risks of operative and nonoperative management including infection, bleeding, damage to neurovascular structures, failure of procedure, need for further procedures, painful hardware, dyscosmetic scars and anesthetic complications including myocardial infarction, stroke or even death were discussed prior to surgery with patient and informed consent form was completed.    Procedure:   The patient was identified  properly. Informed consent was obtained and the surgical site was marked. The patient was taken up to suite where general anesthesia was induced.  The patient was positioned on the Hana table.  The left lower extremity was prepped and draped in the usual sterile fashion.  Timeout was performed before the beginning of the case.  Appropriate reduction was obtained and visualized on fluoroscopy prior to the beginning of the procedure.  We made an incision proximal to the greater trochanter and dissected down through the fascia.  We then carefully placed our starting guidwire under fluoroscopy prior to advancing into the bone and entry reamer was used.  We selected a length of nail noted above.  At this point we placed our nail localizing under fluoroscopy that it was at the appropriate level prior to using the outrigger device to pass a wire and then the two cephalo-medullary screws.  The screws were locked proximally to avoid over collapse.  We took final shots at the proximal femur and then used the outrigger to place one distal interlock screw.  Final pictures were obtained.  The wounds were thoroughly irrigated closed in a multilayer fashion with absorable sutures.  A sterile dressing was placed.  The patient was awoken from general anesthesia and taken to the PACU in stable condition without complication.     Thornell Mule, MD Orthopaedic Surgery [facility]

## 2023-07-16 NOTE — Hospital Course (Addendum)
The patient is a 77 year old overweight Caucasian female with a past medical history significant for mellitus anemia, osteoarthritis, cholelithiasis, cataracts, GERD, peptic ulcer disease and history of H. pylori infection, heart murmur, hypertension, hyperlipidemia, hypothyroidism, rheumatoid arthritis as well as other comorbidities who presented to the emergency room with complaints of left hip pain after she sustained a mechanical fall after her knee buckled while transferring up from the toilet.  She has had multiple falls recently and developed immediate pain when she fell but was able to ambulate.  However since then the pain has become so intense that she has now having difficulty bearing weight on the left hip.  She is brought to the emergency room and left hip x-ray done and showed a displaced proximal femur fracture involving the greater trochanter which may be isolated since there is no definitive intertrochanteric component.  She also had left knee x-ray which showed no acute fracture or dislocation but did show tricompartmental osteoarthritis.  She was admitted and orthopedic surgery was consulted and she was taken for surgical intervention and is postoperative day 1 now.  PT OT to further evaluate and treat  Assessment and Plan:  Intertrochanteric fracture of left femur (HCC) -Admit to telemetry/inpatient. -Ice area as needed. -Buck's traction per protocol. -Analgesics as needed. -Antiemetics as needed. -Consult TOC team. -Consult nutritional services. -PT evaluation after surgery. -Orthopedic surgery evaluation appreciated and she underwent intramedullary nailing of the left intertrochanteric hip fracture -She is postoperative day 1 -Orthopedic surgery recommended weightbearing as tolerated as well as DVT prophylaxis Lovenox 40 mg subcu daily until mobilizing then however have now changed to aspirin 81 mg twice daily for DVT prevention considering transition in clinic to alternative  medications -Patient to follow-up with orthopedic surgery about 14 days for incision check -They are recommending up with therapy -Will initiate bowel regimen as well   Essential Hypertension -No longer taking antihypertensive with Amlodipine or Olmesartan -Continue to monitor blood pressures per protocol -Start IV Hydralazine 10 mg q6hprn for SBP >160 or DBP >100 -Last blood pressure reading was a little on the higher side at 162/92   Hyperlipidemia, mixed -Aortic atherosclerosis (HCC) by CXR in 2021 -On lifestyle modifications only. -The patient is on methotrexate. -Patient to discuss this with her PCP.   Rheumatoid Arthritis (HCC)  -On weekly methotrexate. -Continue Hydroxychloroquine 200 mg p.o. twice daily. -C/w Pain Control with acetaminophen 650 mg p.o./RC every 6 as needed for mild pain or fever, Oxycodone 5 mg p.o. every 6 as needed for breakthrough pain, and IV Fentanyl 25 mcg IV every 2 as needed for severe pain  Chronic Diastolic CHF -Strict I's and O's and daily weights will need to continue monitor for signs and symptoms of volume overload  Intake/Output Summary (Last 24 hours) at 07/16/2023 1331 Last data filed at 07/16/2023 1226 Gross per 24 hour  Intake 2678.14 ml  Output 1150 ml  Net 1528.14 ml  -Discontinue IV fluid hydration   Gastroesophageal Reflux Disease without Esophagitis/GI Prophylaxis -Continue Omeprazole substitution with formulary equivalent of Pantoprazole 40 mg po Daily    Hypothyroidism -Continue Levothyroxine 88 mcg p.o. daily.  Hyponatremia -Mild. Na+ Trend: Recent Labs  Lab 07/06/23 1134 07/15/23 1255 07/16/23 0355  NA 142 140 133*  -Continue to Monitor and Trend and Repeat CMP in the AM   Normocytic Anemia -Hgb/Hct Trend: Recent Labs  Lab 07/06/23 1134 07/15/23 1255 07/16/23 0355  HGB 12.1 12.3 9.8*  HCT 37.2 37.7 30.9*  MCV 93.9 95.9 96.6  -Check Anemia  Panel in the AM -Continue to Monitor for S/Sx of Bleeding; No overt  bleeding noted -Repeat CBC in the AM  Thrombocytopenia -Platelet Count Trend: Recent Labs  Lab 07/06/23 1134 07/15/23 1255 07/16/23 0355  PLT 155 111* 88*  -Continue to Monitor for S/Sx of Bleeding; No overt bleeding noted -Repeat CBC in the AM   Hypoalbuminemia -Patient's Albumin Trend: Recent Labs  Lab 07/16/23 0355  ALBUMIN 2.8*  -Continue to Monitor and Trend and repeat CMP in the AM  Overweight -Complicates overall prognosis and care -Estimated body mass index is 27.03 kg/m as calculated from the following:   Height as of this encounter: 5\' 1"  (1.549 m).   Weight as of this encounter: 64.9 kg.  -Weight Loss and Dietary Counseling given

## 2023-07-16 NOTE — Plan of Care (Signed)

## 2023-07-16 NOTE — Progress Notes (Signed)
Initial Nutrition Assessment  DOCUMENTATION CODES:   Not applicable  INTERVENTION:   - Ensure Enlive po BID, each supplement provides 350 kcal and 20 grams of protein  - MVI with minerals daily  NUTRITION DIAGNOSIS:   Increased nutrient needs related to hip fracture, post-op healing as evidenced by estimated needs.  GOAL:   Patient will meet greater than or equal to 90% of their needs  MONITOR:   PO intake, Supplement acceptance, Labs  REASON FOR ASSESSMENT:   Consult Hip fracture protocol  ASSESSMENT:   77 year old female who presented to the ED on 9/06 after a fall earlier in the week. PMH of HTN, anemia, osteoarthritis, cholelithiasis, cataracts, GERD, PUD, H. pylori infection, HLD, hypothyroidism, rheumatoid arthritis. Pt admitted with L hip greater trochanter fracture.  9/06 - s/p L hip IMN  RD working remotely.  Spoke with pt via phone call to room. Pt still groggy from surgery overnight and reports significant pain in L hip. Pt states that she is working on eating a blueberry muffin right now and has a good appetite. Pt denies any N/V at this time. Pt reports that she has a good appetite at baseline and denies any recent weight changes.  Reviewed weight history in chart. Weight appears stable over the last 2 years with fluctuations between 61-64 kg.  Pt willing to try an oral nutrition supplement given increased nutrition needs related to hip fx and post-op healing. RD will also order daily MVI with minerals.  Medications reviewed and include: magnesium gluconate 500 mg every M-W-F, protonix IVF: D5 @ 100 ml/hr  Labs reviewed: sodium 133, hemoglobin 9.8, platelets 88 CBG's: 73-185 x 24 hours  NUTRITION - FOCUSED PHYSICAL EXAM:  Unable to complete. RD working remotely.  Diet Order:   Diet Order             Diet Heart Room service appropriate? Yes; Fluid consistency: Thin  Diet effective now                   EDUCATION NEEDS:   Education needs  have been addressed  Skin:  Skin Assessment: Reviewed RN Assessment (incision L hip)  Last BM:  07/14/23  Height:   Ht Readings from Last 1 Encounters:  07/15/23 5\' 1"  (1.549 m)    Weight:   Wt Readings from Last 1 Encounters:  07/15/23 64.9 kg    BMI:  Body mass index is 27.03 kg/m.  Estimated Nutritional Needs:   Kcal:  1550-1750  Protein:  75-85 grams  Fluid:  1.5-1.7 L    Mertie Clause, MS, RD, LDN Registered Dietitian II Please see AMiON for contact information.

## 2023-07-16 NOTE — Evaluation (Addendum)
Physical Therapy Evaluation Patient Details Name: [name] MRN: [id number] DOB: Jan 05, 1946 Today's Date: 07/16/2023  History of Present Illness  77 yo female admitted with L femur fx after falling at home. s/P IM nail 9/6. Hx of anemia, OA, RA, R TSA 2015  Clinical Impression  On eval, pt required Min A to get to EOB and Min A to stand and take a few steps in the room. Mobility is limited by pain and some cognitive deficits at present-? Due to pain meds/anesthesia? Husband present during session-he is hopeful pt will be able to return home. Explained that she will need to demonstrate ability to safely ambulate and negotiate stairs. Pain will also need to be adequately controlled without adversely affecting patients cognition. Will continue to follow and progress activity as tolerated. Will recommend HHPT at this time-may need to update recommendations if mobility and pain control does not progress in a timely manner.       If plan is discharge home, recommend the following: Assistance with cooking/housework;Assist for transportation;Help with stairs or ramp for entrance;A lot of help with walking and/or transfers;A lot of help with bathing/dressing/bathroom   Can travel by private vehicle        Equipment Recommendations Rolling walker (2 wheels);BSC/3in1  Recommendations for Other Services  OT consult    Functional Status Assessment Patient has had a recent decline in their functional status and demonstrates the ability to make significant improvements in function in a reasonable and predictable amount of time.     Precautions / Restrictions Precautions Precautions: Fall Precaution Comments: husband reports LE sometimes buckle Restrictions Weight Bearing Restrictions: No LLE Weight Bearing: Weight bearing as tolerated      Mobility  Bed Mobility Overal bed mobility: Needs Assistance Bed Mobility: Sidelying to Sit, Rolling Rolling: Min assist Sidelying to sit: Min  assist, Used rails       General bed mobility comments: Increased time. Cues provided. Most assistance required to scoot to EOB.    Transfers Overall transfer level: Needs assistance Equipment used: Rolling walker (2 wheels) Transfers: Sit to/from Stand, Bed to chair/wheelchair/BSC Sit to Stand: Min assist, From elevated surface Stand pivot transfers: Min assist         General transfer comment: Assist to rise, steady, control descent. Cues for safety, technique, hand placement. Increased time. Stand pivot, bed >bsc, using RW.    Ambulation/Gait Ambulation/Gait assistance: Min assist Gait Distance (Feet): 3 Feet Assistive device: Rolling walker (2 wheels) Gait Pattern/deviations: Step-to pattern, Antalgic, Trunk flexed       General Gait Details: Cues for safety, RW proximity, posture. Pt took a few steps from bsc to recliner using RW. Remains a fall risk but did suprisingly well considering pain.  Stairs            Wheelchair Mobility     Tilt Bed    Modified Rankin (Stroke Patients Only)       Balance Overall balance assessment: Needs assistance, History of Falls         Standing balance support: Reliant on assistive device for balance, Bilateral upper extremity supported, During functional activity Standing balance-Leahy Scale: Poor                               Pertinent Vitals/Pain Pain Assessment Pain Assessment: Faces Faces Pain Scale: Hurts whole lot Pain Location: L LE with activity Pain Descriptors / Indicators: Operative site guarding, Grimacing (occasional curse word) Pain  Intervention(s): Limited activity within patient's tolerance, Monitored during session, Repositioned    Home Living Family/patient expects to be discharged to:: Private residence Living Arrangements: Spouse/significant other   Type of Home: House Home Access: Stairs to enter Entrance Stairs-Rails: None Entrance Stairs-Number of Steps: 2   Home  Layout: One level Home Equipment: Rollator (4 wheels)      Prior Function Prior Level of Function : History of Falls (last six months)             Mobility Comments: using walker PRN. ADLs Comments: bathing, dressing. no driving.     Extremity/Trunk Assessment   Upper Extremity Assessment Upper Extremity Assessment: Defer to OT evaluation    Lower Extremity Assessment Lower Extremity Assessment: Generalized weakness    Cervical / Trunk Assessment Cervical / Trunk Assessment: Kyphotic  Communication      Cognition Arousal: Alert Behavior During Therapy: WFL for tasks assessed/performed Overall Cognitive Status: Impaired/Different from baseline Area of Impairment: Following commands, Problem solving                       Following Commands: Follows one step commands with increased time     Problem Solving: Requires verbal cues General Comments: some general confusion-? due to meds/anesthesia?        General Comments      Exercises     Assessment/Plan    PT Assessment Patient needs continued PT services  PT Problem List Pain;Decreased strength;Decreased range of motion;Decreased activity tolerance;Decreased balance;Decreased mobility;Decreased knowledge of use of DME       PT Treatment Interventions DME instruction;Gait training;Functional mobility training;Therapeutic activities;Therapeutic exercise;Balance training;Patient/family education    PT Goals (Current goals can be found in the Care Plan section)  Acute Rehab PT Goals Patient Stated Goal: less pain PT Goal Formulation: With patient/family Time For Goal Achievement: 07/30/23 Potential to Achieve Goals: Good    Frequency Min 1X/week     Co-evaluation               AM-PAC PT "6 Clicks" Mobility  Outcome Measure Help needed turning from your back to your side while in a flat bed without using bedrails?: A Little Help needed moving from lying on your back to sitting on the  side of a flat bed without using bedrails?: A Lot Help needed moving to and from a bed to a chair (including a wheelchair)?: A Little Help needed standing up from a chair using your arms (e.g., wheelchair or bedside chair)?: A Little Help needed to walk in hospital room?: A Little Help needed climbing 3-5 steps with a railing? : A Lot 6 Click Score: 16    End of Session Equipment Utilized During Treatment: Gait belt Activity Tolerance: Patient tolerated treatment well;Patient limited by pain Patient left: in chair;with call bell/phone within reach;with family/visitor present;with chair alarm set   PT Visit Diagnosis: Pain;History of falling (Z91.81);Difficulty in walking, not elsewhere classified (R26.2);Muscle weakness (generalized) (M62.81) Pain - Right/Left: Left Pain - part of body: Hip;Leg    Time: 4782-9562 PT Time Calculation (min) (ACUTE ONLY): 36 min   Charges:   PT Evaluation $PT Eval Low Complexity: 1 Low PT Treatments $Gait Training: 8-22 mins PT General Charges $$ ACUTE PT VISIT: 1 Visit           Faye Ramsay, PT Acute Rehabilitation  Office: [phone]

## 2023-07-16 NOTE — Progress Notes (Signed)
The patient is NSR 69. Pt/spouse inquired regarding removal of equipment. Hospitalist provider contacted, V.O received may discontinue telemetry.

## 2023-07-17 DIAGNOSIS — D649 Anemia, unspecified: Secondary | ICD-10-CM | POA: Insufficient documentation

## 2023-07-17 DIAGNOSIS — D696 Thrombocytopenia, unspecified: Secondary | ICD-10-CM | POA: Insufficient documentation

## 2023-07-17 DIAGNOSIS — S72002A Fracture of unspecified part of neck of left femur, initial encounter for closed fracture: Secondary | ICD-10-CM | POA: Diagnosis not present

## 2023-07-17 DIAGNOSIS — G9341 Metabolic encephalopathy: Secondary | ICD-10-CM | POA: Insufficient documentation

## 2023-07-17 LAB — COMPREHENSIVE METABOLIC PANEL
ALT: 20 U/L (ref 0–44)
AST: 43 U/L — ABNORMAL HIGH (ref 15–41)
Albumin: 3.7 g/dL (ref 3.5–5.0)
Alkaline Phosphatase: 63 U/L (ref 38–126)
Anion gap: 11 (ref 5–15)
BUN: 9 mg/dL (ref 8–23)
CO2: 26 mmol/L (ref 22–32)
Calcium: 10.3 mg/dL (ref 8.9–10.3)
Chloride: 103 mmol/L (ref 98–111)
Creatinine, Ser: 0.53 mg/dL (ref 0.44–1.00)
GFR, Estimated: >60 mL/min (ref >60)
Glucose, Bld: 98 mg/dL (ref 70–99)
Potassium: 4 mmol/L (ref 3.5–5.1)
Sodium: 140 mmol/L (ref 135–145)
Total Bilirubin: 1 mg/dL (ref 0.3–1.2)
Total Protein: 6.1 g/dL — ABNORMAL LOW (ref 6.5–8.1)

## 2023-07-17 LAB — CBC WITH DIFFERENTIAL/PLATELET
Abs Immature Granulocytes: 0.06 10*3/uL (ref 0.00–0.07)
Basophils Absolute: 0 10*3/uL (ref 0.0–0.1)
Basophils Relative: 0 %
Eosinophils Absolute: 0.5 10*3/uL (ref 0.0–0.5)
Eosinophils Relative: 3 %
HCT: 33.3 % — ABNORMAL LOW (ref 36.0–46.0)
Hemoglobin: 10.9 g/dL — ABNORMAL LOW (ref 12.0–15.0)
Immature Granulocytes: 0 %
Lymphocytes Relative: 7 %
Lymphs Abs: 1.2 10*3/uL (ref 0.7–4.0)
MCH: 30.8 pg (ref 26.0–34.0)
MCHC: 32.7 g/dL (ref 30.0–36.0)
MCV: 94.1 fL (ref 80.0–100.0)
Monocytes Absolute: 1.3 10*3/uL — ABNORMAL HIGH (ref 0.1–1.0)
Monocytes Relative: 8 %
Neutro Abs: 13.2 10*3/uL — ABNORMAL HIGH (ref 1.7–7.7)
Neutrophils Relative %: 82 %
Platelets: 165 10*3/uL (ref 150–400)
RBC: 3.54 MIL/uL — ABNORMAL LOW (ref 3.87–5.11)
RDW: 13.2 % (ref 11.5–15.5)
WBC: 16.3 10*3/uL — ABNORMAL HIGH (ref 4.0–10.5)
nRBC: 0 % (ref 0.0–0.2)

## 2023-07-17 LAB — PHOSPHORUS: Phosphorus: 2 mg/dL — ABNORMAL LOW (ref 2.5–4.6)

## 2023-07-17 LAB — GLUCOSE, CAPILLARY
Glucose-Capillary: 104 mg/dL — ABNORMAL HIGH (ref 70–99)
Glucose-Capillary: 98 mg/dL (ref 70–99)

## 2023-07-17 LAB — MAGNESIUM: Magnesium: 2.1 mg/dL (ref 1.7–2.4)

## 2023-07-17 MED ORDER — HYDROCODONE-ACETAMINOPHEN 5-325 MG PO TABS: 1.0000 | ORAL_TABLET | Freq: Four times a day (QID) | ORAL | 0 refills | Status: DC | PRN

## 2023-07-17 NOTE — Progress Notes (Signed)
Physical Therapy Treatment Patient Details Name: [name] MRN: [id number] DOB: 1946-05-14 Today's Date: 07/17/2023   History of Present Illness 77 yo female admitted with L femur fx after falling at home. s/P IM nail 9/6. Hx of anemia, OA, RA, R TSA 2015    PT Comments  Pt agreeable to working with therapy. She participated well. Pain appears controlled. Practiced gait and stair training. Will plan to have a 2nd session to practice again with pt and husband. Explained to husband that pt should not be allowed to get up unassisted-recommend 24/7 supervision and assistance for safety.     If plan is discharge home, recommend the following: Assistance with cooking/housework;Assist for transportation;Help with stairs or ramp for entrance;A lot of help with walking and/or transfers;A lot of help with bathing/dressing/bathroom   Can travel by private vehicle        Equipment Recommendations  Rolling walker (2 wheels);BSC/3in1    Recommendations for Other Services       Precautions / Restrictions Precautions Precautions: Fall Precaution Comments: husband reports LE sometimes buckle Restrictions Weight Bearing Restrictions: No LLE Weight Bearing: Weight bearing as tolerated     Mobility  Bed Mobility               General bed mobility comments: oob in recliner    Transfers Overall transfer level: Needs assistance Equipment used: Rolling walker (2 wheels) Transfers: Sit to/from Stand Sit to Stand: Min assist           General transfer comment: Assist to rise, steady, control descent. Cues for safety, technique, hand placement. Increased time.    Ambulation/Gait Ambulation/Gait assistance: Min assist Gait Distance (Feet): 50 Feet Assistive device: Rolling walker (2 wheels) Gait Pattern/deviations: Step-through pattern, Decreased stride length, Trunk flexed       General Gait Details: Cues for safety. Tolerated distance well.   Stairs Stairs: Yes Stairs  assistance: Min assist Stair Management: Forwards, Step to pattern, With walker Number of Stairs: 2 General stair comments: up and down stairs with use of RW. Husband present to observe and practice. Cues for safety, technique, sequence.   Wheelchair Mobility     Tilt Bed    Modified Rankin (Stroke Patients Only)       Balance Overall balance assessment: Needs assistance, History of Falls         Standing balance support: Reliant on assistive device for balance, Bilateral upper extremity supported, During functional activity Standing balance-Leahy Scale: Poor                              Cognition Arousal: Alert Behavior During Therapy: WFL for tasks assessed/performed Overall Cognitive Status: Impaired/Different from baseline Area of Impairment: Orientation, Problem solving, Safety/judgement, Memory, Following commands                 Orientation Level: Disoriented to, Time   Memory: Decreased short-term memory Following Commands: Follows one step commands with increased time Safety/Judgement: Decreased awareness of safety   Problem Solving: Requires verbal cues General Comments: pt with acute confusion-possibly due to anesthesia/pain meds. unsure if there is some underlying confusion present at baseline.        Exercises      General Comments        Pertinent Vitals/Pain Pain Assessment Pain Assessment: Faces Faces Pain Scale: Hurts little more Pain Location: L hip Pain Descriptors / Indicators: Discomfort Pain Intervention(s): Monitored during session, Repositioned, Premedicated before session  Home Living                          Prior Function            PT Goals (current goals can now be found in the care plan section) Progress towards PT goals: Progressing toward goals    Frequency    Min 1X/week      PT Plan      Co-evaluation              AM-PAC PT "6 Clicks" Mobility   Outcome Measure   Help needed turning from your back to your side while in a flat bed without using bedrails?: A Little Help needed moving from lying on your back to sitting on the side of a flat bed without using bedrails?: A Little Help needed moving to and from a bed to a chair (including a wheelchair)?: A Little Help needed standing up from a chair using your arms (e.g., wheelchair or bedside chair)?: A Little Help needed to walk in hospital room?: A Little Help needed climbing 3-5 steps with a railing? : A Little 6 Click Score: 18    End of Session Equipment Utilized During Treatment: Gait belt Activity Tolerance: Patient tolerated treatment well Patient left: in chair;with call bell/phone within reach;with family/visitor present   PT Visit Diagnosis: Pain;History of falling (Z91.81);Difficulty in walking, not elsewhere classified (R26.2);Muscle weakness (generalized) (M62.81) Pain - Right/Left: Left Pain - part of body: Hip;Leg     Time: 7829-5621 PT Time Calculation (min) (ACUTE ONLY): 10 min  Charges:    $Gait Training: 8-22 mins PT General Charges $$ ACUTE PT VISIT: 1 Visit                        Faye Ramsay, PT Acute Rehabilitation  Office: [phone]

## 2023-07-17 NOTE — Progress Notes (Signed)
Pt not following safety awareness repeatedly trying to climb over the bed, pt pulled IV out and dressing to the left hip. New dressing placed to the left hip. Refused morning labs. Pt is alert to person, place and situation. Disoriented to time. Floor coverage NP notified. Pt husband notified.

## 2023-07-17 NOTE — Anesthesia Postprocedure Evaluation (Signed)
Anesthesia Post Note  Patient: [name]  Procedure(s) Performed: INTRAMEDULLARY (IM) NAIL INTERTROCHANTERIC (Left)     Patient location during evaluation: PACU Anesthesia Type: General Level of consciousness: awake Pain management: pain level controlled Vital Signs Assessment: post-procedure vital signs reviewed and stable Respiratory status: spontaneous breathing, nonlabored ventilation and respiratory function stable Cardiovascular status: blood pressure returned to baseline and stable Postop Assessment: no apparent nausea or vomiting Anesthetic complications: no   No notable events documented.  Last Vitals:  Vitals:   07/16/23 2054 07/17/23 0546  BP: (!) 137/90 112/75  Pulse: 76 76  Resp: 16 17  Temp: 36.7 C 36.6 C  SpO2: 93% 95%    Last Pain:  Vitals:   07/17/23 0500  TempSrc:   PainSc: 0-No pain                 [name] P Jdyn Parkerson

## 2023-07-17 NOTE — Progress Notes (Signed)
Physical Therapy Treatment Patient Details Name: [name] MRN: [id number] DOB: [birth year] Today's Date: 07/17/2023   History of Present Illness 77 yo female admitted with L femur fx after falling at home. s/P IM nail 9/6. Hx of anemia, OA, RA, R TSA 2015    PT Comments  2nd session for hands-on practice (husband assisting pt with mobility tasks). Reviewed/practiced bed mobility, transfers, gait and stair training. Instructed husband to use gait belt and to assist pt with all mobility tasks. Recommend 24/7 supervision and assist. Recommend HHPT f/u and RW use for ambulation safety. All PT education completed.     If plan is discharge home, recommend the following: Assistance with cooking/housework;Assist for transportation;Help with stairs or ramp for entrance;A lot of help with walking and/or transfers;A lot of help with bathing/dressing/bathroom   Can travel by private vehicle        Equipment Recommendations  Rolling walker (2 wheels);BSC/3in1    Recommendations for Other Services OT consult     Precautions / Restrictions Precautions Precautions: Fall Precaution Comments: husband reports LE sometimes buckle Restrictions Weight Bearing Restrictions: No LLE Weight Bearing: Weight bearing as tolerated     Mobility  Bed Mobility Overal bed mobility: Needs Assistance Bed Mobility: Supine to Sit     Supine to sit: Contact guard, HOB elevated, Used rails     General bed mobility comments: Pt used UEs to help L LE off bed. Increased time. Cues provided by therapist and husband.    Transfers Overall transfer level: Needs assistance Equipment used: Rolling walker (2 wheels) Transfers: Sit to/from Stand Sit to Stand: Min assist           General transfer comment: Assist to rise, steady, control descent. Cues for safety, technique, hand placement. Increased time. Husband assisting as needed.    Ambulation/Gait Ambulation/Gait assistance: Contact guard  assist Gait Distance (Feet): 90 Feet Assistive device: Rolling walker (2 wheels) Gait Pattern/deviations: Step-to pattern, Step-through pattern, Decreased stride length       General Gait Details: Cues for safety. Tolerated distance well. Pt went back and forth between step to and step thru gait pattern. Slow gait speed but no LOB with RW use.   Stairs Stairs: Yes Stairs assistance: Min assist Stair Management: Forwards, Step to pattern, With walker Number of Stairs: 2 General stair comments: up and down stairs with use of RW. Pt practiced with husband assisting. Cues for safety, technique, sequence.   Wheelchair Mobility     Tilt Bed    Modified Rankin (Stroke Patients Only)       Balance Overall balance assessment: Needs assistance, History of Falls         Standing balance support: Reliant on assistive device for balance, Bilateral upper extremity supported, During functional activity Standing balance-Leahy Scale: Poor                              Cognition Arousal: Alert Behavior During Therapy: WFL for tasks assessed/performed Overall Cognitive Status: Impaired/Different from baseline Area of Impairment: Orientation, Problem solving, Safety/judgement, Memory, Following commands                 Orientation Level: Disoriented to, Time   Memory: Decreased short-term memory Following Commands: Follows one step commands with increased time Safety/Judgement: Decreased awareness of safety   Problem Solving: Requires verbal cues General Comments: pt with acute confusion-possibly due to anesthesia/pain meds. unsure if there is some underlying confusion present at  baseline.        Exercises      General Comments        Pertinent Vitals/Pain Pain Assessment Pain Assessment: Faces Faces Pain Scale: Hurts little more Pain Location: L hip Pain Descriptors / Indicators: Discomfort Pain Intervention(s): Monitored during session, Repositioned     Home Living                          Prior Function            PT Goals (current goals can now be found in the care plan section) Progress towards PT goals: Progressing toward goals    Frequency    Min 1X/week      PT Plan      Co-evaluation              AM-PAC PT "6 Clicks" Mobility   Outcome Measure  Help needed turning from your back to your side while in a flat bed without using bedrails?: A Little Help needed moving from lying on your back to sitting on the side of a flat bed without using bedrails?: A Little Help needed moving to and from a bed to a chair (including a wheelchair)?: A Little Help needed standing up from a chair using your arms (e.g., wheelchair or bedside chair)?: A Little Help needed to walk in hospital room?: A Little Help needed climbing 3-5 steps with a railing? : A Little 6 Click Score: 18    End of Session Equipment Utilized During Treatment: Gait belt Activity Tolerance: Patient tolerated treatment well Patient left: in chair;with call bell/phone within reach;with chair alarm set;with family/visitor present   PT Visit Diagnosis: Pain;History of falling (Z91.81);Difficulty in walking, not elsewhere classified (R26.2);Muscle weakness (generalized) (M62.81) Pain - Right/Left: Left Pain - part of body: Hip     Time: 3086-5784 PT Time Calculation (min) (ACUTE ONLY): 13 min  Charges:    $Gait Training: 8-22 mins PT General Charges $$ ACUTE PT VISIT: 1 Visit                        Faye Ramsay, PT Acute Rehabilitation  Office: [phone]

## 2023-07-17 NOTE — TOC Transition Note (Signed)
Transition of Care [facility]) - CM/SW Discharge Note   Patient Details  Name: [name] MRN: [id number] Date of Birth: Mar 05, 1946  [facility]) CM/SW Contact:  Darleene Cleaver, LCSW Phone Number: 07/17/2023, 2:38 PM   Clinical Narrative:     Patient will be going home with home health through [facility].  TOC signing off please reconsult with any other TOC needs, home health agency has been notified of planned discharge.  Per Laurelyn Sickle at [facility], they should be able to see patient within 48 hours of discharge.      Final next level of care: Home w Home Health Services Barriers to Discharge: Barriers Resolved   Patient Goals and CMS Choice CMS Medicare.gov Compare Post Acute Care list provided to:: Patient Choice offered to / list presented to : Patient, Spouse  Discharge Placement                         Discharge Plan and Services Additional resources added to the After Visit Summary for                  DME Arranged: Walker rolling DME Agency: [employer] Date DME Agency Contacted: 07/17/23 Time DME Agency Contacted: 1045 Representative spoke with at DME Agency: Marlin Canary HH Arranged: PT HH Agency: [facility] Date HH Agency Contacted: 07/17/23 Time HH Agency Contacted: 1115 Representative spoke with at [facility] Agency: Laurelyn Sickle  Social Determinants of Health (SDOH) Interventions SDOH Screenings   Food Insecurity: No Food Insecurity (07/15/2023)  Housing: Low Risk  (07/15/2023)  Transportation Needs: No Transportation Needs (07/15/2023)  Utilities: Not At Risk (07/15/2023)  Depression (PHQ2-9): Low Risk  (07/21/2022)  Tobacco Use: Medium Risk (07/15/2023)     Readmission Risk Interventions     No data to display

## 2023-07-17 NOTE — Progress Notes (Signed)
Subjective: 2 Days Post-Op Procedure(s) (LRB): INTRAMEDULLARY (IM) NAIL INTERTROCHANTERIC (Left)  Patient resting comfortably in bed. Family at bedside. She wants to go home today if possible.  Activity level:  wbat Diet tolerance:  ok Voiding:  ok Patient reports pain as mild.    Objective: Vital signs in last 24 hours: Temp:  [97.7 F (36.5 C)-98 F (36.7 C)] 97.8 F (36.6 C) (09/08 0546) Pulse Rate:  [74-79] 76 (09/08 0546) Resp:  [16-18] 17 (09/08 0546) BP: (112-162)/(69-92) 112/75 (09/08 0546) SpO2:  [92 %-95 %] 95 % (09/08 0546)  Labs: Recent Labs    07/15/23 1255 07/16/23 0355  HGB 12.3 9.8*   Recent Labs    07/15/23 1255 07/16/23 0355  WBC 10.4 8.4  RBC 3.93 3.20*  HCT 37.7 30.9*  PLT 111* 88*   Recent Labs    07/15/23 1255 07/16/23 0355  NA 140 133*  K 3.6 4.1  CL 105 101  CO2 27 25  BUN 7* 8  CREATININE 0.75 0.63  GLUCOSE 69* 204*  CALCIUM 9.6 8.9   Recent Labs    07/15/23 1255  INR 1.1    Physical Exam:  Neurologically intact ABD soft Neurovascular intact Sensation intact distally Intact pulses distally Dorsiflexion/Plantar flexion intact Incision: dressing C/D/I No cellulitis present Compartment soft  Assessment/Plan:  2 Days Post-Op Procedure(s) (LRB): INTRAMEDULLARY (IM) NAIL INTERTROCHANTERIC (Left) Advance diet Up with therapy If she passes PT she could go home with HHPT instead of SNF. Once she passes PT and is cleared by medical team she is clear for discharge from an orthopedic standpoint. Continue 81mg  asa BID for DVT prevention. Follow up with Dr. Hulda Humphrey 2 weeks post op. Weightbearing as tolerated left leg. We greatly appreciate medical management.  Ginger Organ Geo Slone 07/17/2023, 9:33 AM

## 2023-07-17 NOTE — Clinical Social Work Note (Signed)
    Durable Medical Equipment  (From admission, onward)           Start     Ordered   07/17/23 1108  For home use only DME Walker rolling  Once       Question Answer Comment  Walker: With 5 Inch Wheels   Patient needs a walker to treat with the following condition Hip fracture (HCC)      07/17/23 1107

## 2023-07-17 NOTE — Discharge Summary (Signed)
Physician Discharge Summary   Patient: [name] MRN: [id number] DOB: 1946-09-05  Admit date:     07/15/2023  Discharge date: 07/17/23  Discharge Physician: Alberteen Sam   PCP: Lucky Cowboy, MD     Recommendations at discharge:  Follow up with Orthopedics Dr. Hulda Humphrey in 2 weeks for hip fracture repair Follow up with PCP Dr. Oneta Rack in 1 week Dr. Oneta Rack:  Please check CBC in 1 week     Discharge Diagnoses: Principal Problem:   Closed left hip fracture [facility]) Active Problems:   Essential hypertension   Rheumatoid arthritis (HCC)   Hyperlipidemia, mixed   Hypothyroidism   Aortic atherosclerosis (HCC) by CXR in 2021   Hyponatremia   Normocytic anemia   Thrombocytopenia (HCC)   Acute metabolic encephalopathy      Hospital Course: [name] is a 77 y.o. F with HTN, HLD, RA on methotrexate and HCQ, hypothyroidism and DM who presented with mechanical fall.   Evaluated by Orthopedics who obtained MRI that showed intertrochanteric hip fracture.     Hip fracture Patient was admitted and orthopedics were consulted.  She underwent intramedullary nail on 9/7 by Dr. Hulda Humphrey.       Mild delirium Thrombocytopenia Leukocytosis Postoperative course was complicated by transient thrombocytopenia without bleeding.  She had expected postoperative blood loss.  She did have leukocytosis on the day of discharge, but this was suspected to be stress demargination as she had no symptoms at all of pain/redness around the incision, cough/dyspnea, urinary irritative symptoms.  She also did have some disorientation to time, delirium overnight, but this was improved during the day, and would be best managed at home and familiar surroundings.  She function well with physical therapy, was discharged home with home health.              The [facility] Controlled Substances Registry was reviewed for this patient prior to discharge.  Consultants: Orthopedics, Dr.  Hulda Humphrey Procedures performed: Intramedullary nail  Disposition: Home health Diet recommendation:  Regular diet  DISCHARGE MEDICATION: Allergies as of 07/17/2023       Reactions   Tape Other (See Comments)   Paper tape ONLY, please!! Not allergic, sensitive   Ace Inhibitors Cough   Ambien [zolpidem] Other (See Comments)   "Odd feeling"   Crestor [rosuvastatin] Other (See Comments)   Sore muscles   Leflunomide Other (See Comments)   Elevated LFTs   Pravastatin Other (See Comments)   Sore muscles   Prozac [fluoxetine Hcl] Other (See Comments)   Decreased libido   Zoloft [sertraline Hcl] Other (See Comments)   Reaction not recalled        Medication List     STOP taking these medications    amLODipine 2.5 MG tablet Commonly known as: NORVASC   olmesartan 20 MG tablet Commonly known as: BENICAR   traMADol 50 MG tablet Commonly known as: ULTRAM       TAKE these medications    aspirin 81 MG tablet Take 1 tablet (81 mg total) by mouth 2 (two) times daily after a meal. For 2 weeks then once a day for 2 weeks for dvt prevention. What changed:  when to take this additional instructions   folic acid 1 MG tablet Commonly known as: FOLVITE Take 1-3 mg by mouth daily.   gabapentin 600 MG tablet Commonly known as: NEURONTIN TAKE 1/2 TO 1 TABLET 2 TO 3 TIMES DAILY AS NEEDED FOR PAIN   gabapentin 300 MG capsule Commonly known as: NEURONTIN  Take 300 mg by mouth every evening.   HYDROcodone-acetaminophen 5-325 MG tablet Commonly known as: NORCO/VICODIN Take 1-2 tablets by mouth every 6 (six) hours as needed for severe pain (post op pain). What changed:  how much to take when to take this reasons to take this   hydroxychloroquine 200 MG tablet Commonly known as: PLAQUENIL Take 200 mg by mouth 2 (two) times daily.   ibuprofen 800 MG tablet Commonly known as: IBU TAKE 1 TABLET 2-3 TIMES DAILY WITH FOOD FOR PAIN AND INFLAMMATION What changed:  how much to  take how to take this when to take this additional instructions   levothyroxine 88 MCG tablet Commonly known as: SYNTHROID Take 88 mcg by mouth daily before breakfast. What changed: Another medication with the same name was removed. Continue taking this medication, and follow the directions you see here.   magnesium gluconate 500 MG tablet Commonly known as: MAGONATE Take 500 mg by mouth See admin instructions. Take 500 mg by mouth at bedtime on Mon/Wed/Fri   methotrexate 50 MG/2ML injection Inject 25 mg into the skin every Thursday.   omeprazole 40 MG capsule Commonly known as: PRILOSEC TAKE 1 CAPSULE TWICE DAILY TO PREVENT HEARTBURN AND INDIGESTION What changed:  how much to take how to take this when to take this additional instructions   traZODone 150 MG tablet Commonly known as: DESYREL TAKE 1 TABLET 1 HOUR BEFORE BEDTIME AS NEEDED FOR SLEEP What changed: See the new instructions.   vitamin C 1000 MG tablet Take 1,000 mg by mouth at bedtime.   Vitamin D3 125 MCG (5000 UT) Caps Take 5,000 Units by mouth 3 (three) times a week.   zinc gluconate 50 MG tablet Take 50 mg by mouth daily. 3 times a week               Durable Medical Equipment  (From admission, onward)           Start     Ordered   07/17/23 1108  For home use only DME Walker rolling  Once       Question Answer Comment  Walker: With 5 Inch Wheels   Patient needs a walker to treat with the following condition Hip fracture (HCC)      07/17/23 1107              Discharge Care Instructions  (From admission, onward)           Start     Ordered   07/16/23 0000  Weight bearing as tolerated       Question Answer Comment  Laterality left   Extremity Lower      07/16/23 0920            Follow-up Information     Lucky Cowboy, MD. Schedule an appointment as soon as possible for a visit in 1 week(s).   Specialty: Internal Medicine Contact information: [address] [place] [address] [phone]         Luci Bank, MD. Schedule an appointment as soon as possible for a visit in 2 week(s).   Specialty: Orthopedic Surgery Why: As directed Contact information: [address] [place] [address] [phone]                 Discharge Instructions     Discharge instructions   Complete by: As directed    **IMPORTANT DISCHARGE INSTRUCTIONS**   From Dr. Maryfrances Bunnell: You were admitted for a hip fracture  You had  surgical repair of the hip fracture by Dr. Hulda Humphrey  Follow up with Dr. Hulda Humphrey in 2 weeks (see below in the To Do section)   IMPORTANT: To prevent a blood clot post operatively -- take aspirin 81 mg twice daily for 2 weeks then aspirin 81 mg once daily for 2 weeks  After that four weeks, you can resume your normal three times a day schedule  For pain, I see that Dr. Hulda Humphrey has already prescribed you hydrocodone-acetaminophen (aka Vicodin or Norco)  Take this as needed  Take your normal home ibuprofen 800 mg at night  If you have additional pain despite those two, you may take another 400 mg of ibuprofen (two tabs) twice daily for the next week, but don't do this longer than a week  Go see Dr. Oneta Rack in 1 week and have labs checked  Resume all your other home medicines, without change   Increase activity slowly   Complete by: As directed    Weight bearing as tolerated   Complete by: As directed    Laterality: left   Extremity: Lower       Discharge Exam: Filed Weights   07/15/23 1700  Weight: 64.9 kg    General: Pt is alert, awake, not in acute distress Cardiovascular: RRR, nl S1-S2, no murmurs appreciated.   No LE edema.   Respiratory: Normal respiratory rate and rhythm.  CTAB without rales or wheezes. Abdominal: Abdomen soft and non-tender.  No distension or HSM.   Neuro/Psych: Strength symmetric in upper and lower extremities.  Judgment and insight appear normal.   Condition at  discharge: fair  The results of significant diagnostics from this hospitalization (including imaging, microbiology, ancillary and laboratory) are listed below for reference.   Imaging Studies: DG FEMUR MIN 2 VIEWS LEFT  Result Date: 07/15/2023 CLINICAL DATA:  Left hip ORIF, intraoperative examination EXAM: LEFT FEMUR 2 VIEWS COMPARISON:  MRI 07/15/2023 FINDINGS: Two fluoroscopic intraoperative radiographs of the reported left hip demonstrates surgical changes of left hip ORIF utilizing a short stem intramedullary rod with proximal and distal interlocking screws. Fracture fragments are in anatomic alignment. No dislocation. Fluoroscopic images: 2 Fluoroscopic time: 46 seconds Fluoroscopic dose: 13.23 mGy IMPRESSION: 1. Left hip ORIF. Electronically Signed   By: Helyn Numbers M.D.   On: 07/15/2023 23:35   DG C-Arm 1-60 Min-No Report  Result Date: 07/15/2023 Fluoroscopy was utilized by the requesting physician.  No radiographic interpretation.   DG C-Arm 1-60 Min-No Report  Result Date: 07/15/2023 Fluoroscopy was utilized by the requesting physician.  No radiographic interpretation.   MR HIP LEFT WO CONTRAST  Result Date: 07/15/2023 CLINICAL DATA:  Evaluate proximal left femur fracture EXAM: MR OF THE LEFT HIP WITHOUT CONTRAST TECHNIQUE: Multiplanar, multisequence MR imaging was performed. No intravenous contrast was administered. COMPARISON:  X-ray 07/15/2023 FINDINGS: Bones/Joint/Cartilage Acute comminuted fracture of the greater trochanter of the proximal left femur. Fracture fragments are mildly distracted. There is a nondisplaced trabecular fracture traversing partially through the intertrochanteric region (series 8, images 19-21). Surrounding bone marrow edema. No fracture extension into the femoral head or neck. Hip joint alignment is maintained without dislocation. No additional fractures. No marrow replacing bone lesion. Bilateral hip joint spaces are maintained. Small left hip joint effusion.  No femoral head avascular necrosis. SI joints and pubic symphysis demonstrate mild degenerative changes. Advanced lower lumbar spondylosis, incompletely assessed. Ligaments Grossly intact. Muscles and Tendons Osseous avulsion of the gluteus medius tendon attachment site at the left greater trochanter. Extensive intramuscular edema  within the left gluteus medius and left piriform is muscles. There is also intramuscular edema within the proximal adductor and anterior compartment musculature of the right thigh. There is tendinosis and partial tearing of the bilateral hamstring tendon origins. Soft tissues Soft tissue edema and ill-defined fluid at the fracture site. No inguinal or intrapelvic lymphadenopathy. Sigmoid diverticulosis. IMPRESSION: 1. Acute comminuted fracture of the greater trochanter of the proximal left femur. Nondisplaced trabecular fracture extends through the intertrochanteric region. 2. Osseous avulsion of the left gluteus medius tendon attachment site at the left greater trochanter withextensive intramuscular edema within the gluteus medius and piriformis muscles. 3. Tendinosis and partial tearing of the bilateral hamstring tendon origins. Electronically Signed   By: Duanne Guess D.O.   On: 07/15/2023 16:35   DG Knee Complete 4 Views Left  Result Date: 07/15/2023 CLINICAL DATA:  Pain, fall. EXAM: LEFT KNEE - COMPLETE 4+ VIEW COMPARISON:  Remote left knee radiograph 09/20/2005 FINDINGS: No acute fracture or dislocation. There is mild lateral tibiofemoral joint space narrowing. Mild tricompartmental peripheral spurring. Minimal knee joint effusion. 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 This is unchanged from 2006 exam and definitively benign. No focal soft tissue abnormalities. IMPRESSION: 1. No acute fracture or dislocation of the left knee. 2. Mild tricompartmental osteoarthritis. Electronically Signed   By: Narda Rutherford M.D.   On: 07/15/2023 13:23   DG Hip Unilat With Pelvis 2-3 Views Left  Result Date: 07/15/2023 CLINICAL DATA:  Fall, pain. EXAM: DG HIP (WITH OR WITHOUT PELVIS) 2-3V LEFT COMPARISON:  None Available. FINDINGS: Displaced proximal femur fracture involving the greater trochanter. This may be isolated to the greater trochanter without intertrochanteric involvement. Femoral head is well seated. No hip dislocation. Bony pelvis including pubic rami are intact. Pubic symphysis and sacroiliac joints are congruent. Mild bilateral hip degenerative change. IMPRESSION: Displaced proximal femur fracture involving the greater trochanter. This may represent an isolated greater trochanteric fracture, without a definite intertrochanteric component. Electronically Signed   By: Narda Rutherford M.D.   On: 07/15/2023 13:20   DG Chest 2 View  Result Date: 07/10/2023 CLINICAL DATA:  Preop for knee surgery. EXAM: CHEST - 2 VIEW COMPARISON:  07/03/2021 FINDINGS: The cardiomediastinal contours are normal. Aortic atherosclerosis. Benign calcified granuloma in the right lung apex. Pulmonary vasculature is normal. No consolidation, pleural effusion, or pneumothorax. No acute osseous abnormalities are seen. Right humeral arthroplasty. IMPRESSION: No acute chest findings. Electronically Signed   By: Narda Rutherford M.D.   On: 07/10/2023 21:42    Microbiology: Results for orders placed or performed during the hospital encounter of 07/15/23  Surgical PCR screen     Status: Abnormal   Collection Time: 07/15/23  5:22 PM   Specimen: Nasal Mucosa; Nasal Swab  Result Value Ref Range Status   MRSA, PCR NEGATIVE NEGATIVE Final   Staphylococcus aureus POSITIVE (A) NEGATIVE Final    Comment: (NOTE) The Xpert SA Assay (FDA approved for NASAL specimens in patients 90 years of age and older), is one component of a comprehensive surveillance program. It is not intended to diagnose infection nor to guide or monitor  treatment. Performed at [facility], [address] [place], [place] [address]     Labs: CBC: Recent Labs  Lab 07/15/23 1255 07/16/23 0355 07/17/23 1143  WBC 10.4 8.4 16.3*  NEUTROABS 8.3*  --  13.2*  HGB 12.3 9.8* 10.9*  HCT 37.7 30.9* 33.3*  MCV 95.9  96.6 94.1  PLT 111* 88* 165   Basic Metabolic Panel: Recent Labs  Lab 07/15/23 1255 07/16/23 0355 07/17/23 1143  NA 140 133* 140  K 3.6 4.1 4.0  CL 105 101 103  CO2 27 25 26   GLUCOSE 69* 204* 98  BUN 7* 8 9  CREATININE 0.75 0.63 0.53  CALCIUM 9.6 8.9 10.3  MG  --   --  2.1  PHOS  --   --  2.0*   Liver Function Tests: Recent Labs  Lab 07/16/23 0355 07/17/23 1143  AST 22 43*  ALT 17 20  ALKPHOS 53 63  BILITOT 0.9 1.0  PROT 4.9* 6.1*  ALBUMIN 2.8* 3.7   CBG: Recent Labs  Lab 07/16/23 1137 07/16/23 1710 07/16/23 2056 07/17/23 0737 07/17/23 1241  GLUCAP 191* 136* 128* 104* 98    Discharge time spent: approximately 35 minutes spent on discharge counseling, evaluation of patient on day of discharge, and coordination of discharge planning with nursing, social work, pharmacy and case management  Signed: Alberteen Sam, MD [facility] 07/17/2023

## 2023-07-17 NOTE — Plan of Care (Signed)

## 2023-07-18 ENCOUNTER — Encounter (HOSPITAL_COMMUNITY): Payer: Self-pay | Admitting: Orthopedic Surgery

## 2023-07-24 ENCOUNTER — Encounter: Payer: Self-pay | Admitting: Internal Medicine

## 2023-07-24 NOTE — Patient Instructions (Signed)

## 2023-07-24 NOTE — Progress Notes (Unsigned)
Annual Screening/Preventative Visit & Comprehensive Evaluation &  Examination   Future Appointments  Date Time Provider Department  07/25/2023 10:00 AM Lucky Cowboy, MD [facility]  07/31/2024 10:00 AM Lucky Cowboy, MD [facility]         Patient was recently hospitalized 9/6-07/17/2023 with a Lt hip Fx & underwent intramedullary nail on 9/7 by Dr. Hulda Humphrey & is home now x 1 week.        This very nice 77 y.o. MWF with   HTN, HLD, Rheumatoid Arthritis, Prediabetes , Hypothyroidism. and Vitamin D Deficiency presents for a Screening /Preventative Visit & comprehensive evaluation and management of multiple medical co-morbidities.  Patient  is followed by Dr Orlin Hilding for Rheumatoid Arthritis (1985) and is on MTX /Plaquenil . CXR in 2021 showed Aortic Atherosclerosis. Her GERD is controlled on her meds.          HTN predates since 1997. Patient's BP has been controlled at home and apparently she has been off of her Amlodipine & Olmesartan.  Patient denies any cardiac symptoms as chest pain, palpitations, shortness of breath or ankle swelling.  Today's BP is at goal - 124/70 .         Patient's hyperlipidemia is controlled with diet.   Last lipids were at goal :  Lab Results  Component Value Date   CHOL 169 07/06/2023   HDL 76 07/06/2023   LDLCALC 79 07/06/2023   TRIG 55 07/06/2023   CHOLHDL 2.2 07/06/2023                                             Patient was diagnosed Hypothyroid  and has been on thyroid replacement since the 1980's.       Patient has hx/o prediabetes (A1c 5.8% /2011) and also hx/o elevated Insulin level 25 in 2018.   Patient denies reactive hypoglycemic symptoms, visual blurring, diabetic polys or paresthesias. Last A1c was normal & at goal :  Lab Results  Component Value Date   HGBA1C 5.2 01/20/2023         Finally, patient has history of Vitamin D Deficiency  ("38" /2008) and last Vitamin D was at goal:  Lab Results  Component Value Date    VD25OH 85 01/20/2023       Current Outpatient Medications  Medication Instructions   Aspirin  81 mg  2 times daily after meals, For 2 weeks then once a day for 2 weeks for dvt prevention.   folic acid 1-3 mg Daily   gabapentin  600 MG tablet TAKE 1/2 TO 1 TABLET 2 TO 3 TIMES DAILY AS NEEDED FOR PAIN   gabapentin 300 mg Every evening   VICODIN 5-325 MG tablet 1-2 tablets Every 6 hours PRN   hydroxychloroquine 200 mg 2 times daily   Ibuprofen 800 MG tablet TAKE 1 TABLET 2-3 TIMES DAILY    Levothyroxine  88 mcg Daily    magnesium 500 mg  on Mon/Wed/Fri   Methotrexate  25 mg Subcut, Every Thu   omeprazole  40 MG cap TAKE 1 CAPSULE TWICE DAILY   traZODone 150 MG tablet TAKE 1 TABLET  BEFORE BEDTIME    vitamin C  1,000 mg Daily   Vitamin D  5,000 Units 3 times weekly   zinc 50 mg 3 times a week     Allergies  Allergen Reactions   Ace  Inhibitors Cough   Ambien [Zolpidem] Other (See Comments)    Odd Feeling   Crestor [Rosuvastatin]    Pravastatin    Prozac [Fluoxetine Hcl] Other (See Comments)    Decreased libido   Zoloft [Sertraline Hcl]      Past Medical History:  Diagnosis Date   Anemia    Hx of    Arthritis    Calculus of bile duct without mention of cholecystitis or obstruction    Cataract    GERD (gastroesophageal reflux disease)    H. pylori infection    Hx of    Heart murmur    Hyperlipidemia    Hypertension    Hypothyroid    PUD (peptic ulcer disease)    Rheumatoid arthritis(714.0)      Health Maintenance  Topic Date Due   COVID-19 Vaccine (1) Never done   Zoster Vaccines- Shingrix (1 of 2) Never done   COLONOSCOPY 03/08/2018   INFLUENZA VACCINE  06/08/2021   MAMMOGRAM  07/30/2022   TETANUS/TDAP  11/08/2022   DEXA SCAN  Completed   Hepatitis C Screening  Completed   PNA vac Low Risk Adult  Completed   HPV VACCINES  Aged Out     Immunization History  Administered Date(s) Administered   Influenza, High Dose  08/04/2017, 08/25/2018    Pneumococcal -13 10/16/2015   Pneumococcal -23 11/08/2012   Td 11/08/2012   Zoster, Live 09/17/2015    Last Colon - 03/08/2013 - Dr Arlyce Dice - recc 5 yr f/u - due 2019 - 2019 sent Reminder letter by Dr Lavon Paganini  Last MGM - 09/21/2022   Past Surgical History:  Procedure Laterality Date   AV FISTULA REPAIR     BLADDER SUSPENSION     BREAST CYST EXCISION Left    BUNIONECTOMY     CARPAL TUNNEL RELEASE     CHOLECYSTECTOMY  09/07/2011   CYSTECTOMY     left breast   ERCP  09/29/2011   Procedure: ENDOSCOPIC RETROGRADE CHOLANGIOPANCREATOGRAPHY (ERCP);  Surgeon: Louis Meckel, MD;  Location: [facility];  Service: Endoscopy;  Laterality: N/A;   PILONIDAL CYST EXCISION     TENOLYSIS  10/26/2011   Procedure: TENDON SHEATH RELEASE/TENOLYSIS;  Surgeon: Nicki Reaper, MD;  Location: [facility];  Service: Orthopedics;  Laterality: Right;  tenosynovectomy removal superficialis slip right index finger   TONSILLECTOMY     TOTAL SHOULDER REPLACEMENT Right 02/22/2014   Dr[facility] Malka So at [facility] RELEASE     VAGINAL HYSTERECTOMY     ovaries not removed     Family History  Problem Relation Age of Onset   Lung cancer Mother    Stomach cancer Father    Heart attack Father        MI at age 84   Colon cancer Maternal Grandfather    Heart attack Brother        MI at age 67   Prostate cancer Paternal Grandfather    Breast cancer Paternal Aunt      Social History   Tobacco Use   Smoking status: Former    Types: Cigarettes    Quit date: 10/25/1995    Years since quitting: 25.7   Smokeless tobacco: Never   Tobacco comments:    Johnnye Lana [id number]  Substance Use Topics   Alcohol use: Yes    Alcohol/week: 0.0 standard drinks    Comment: occasional   Drug use: No      ROS Constitutional: Denies fever, chills, weight loss/gain, headaches,  insomnia,  night sweats, and change in appetite. Does c/o fatigue. Eyes: Denies redness, blurred vision, diplopia, discharge,  itchy, watery eyes.  ENT: Denies discharge, congestion, post nasal drip, epistaxis, sore throat, earache, hearing loss, dental pain, Tinnitus, Vertigo, Sinus pain, snoring.  Cardio: Denies chest pain, palpitations, irregular heartbeat, syncope, dyspnea, diaphoresis, orthopnea, PND, claudication, edema Respiratory: denies cough, dyspnea, DOE, pleurisy, hoarseness, laryngitis, wheezing.  Gastrointestinal: Denies dysphagia, heartburn, reflux, water brash, pain, cramps, nausea, vomiting, bloating, diarrhea, constipation, hematemesis, melena, hematochezia, jaundice, hemorrhoids Genitourinary: Denies dysuria, frequency, urgency, nocturia, hesitancy, discharge, hematuria, flank pain Breast: Breast lumps, nipple discharge, bleeding.  Musculoskeletal: Denies arthralgia, myalgia, stiffness, Jt. Swelling, pain, limp, and strain/sprain. Denies falls. Skin: Denies puritis, rash, hives, warts, acne, eczema, changing in skin lesion Neuro: No weakness, tremor, incoordination, spasms, paresthesia, pain Psychiatric: Denies confusion, memory loss, sensory loss. Denies Depression. Endocrine: Denies change in weight, skin, hair change, nocturia, and paresthesia, diabetic polys, visual blurring, hyper / hypo glycemic episodes.  Heme/Lymph: No excessive bleeding, bruising, enlarged lymph nodes.  Physical Exam  BP 124/70   Pulse 61   Temp 97.9 F (36.6 C)   Resp 16   Ht 5\' 1"  (1.549 m)   Wt 141 lb (64 kg)   SpO2 98%   BMI 26.64 kg/m    General Appearance: Well nourished  and in no apparent distress.  Eyes: PERRLA, EOMs, conjunctiva no swelling or erythema, normal fundi and vessels. Sinuses: No frontal/maxillary tenderness ENT/Mouth: EACs patent / TMs  nl. Nares clear without erythema, swelling, mucoid exudates. Oral hygiene is good. No erythema, swelling, or exudate. Tongue normal, non-obstructing. Tonsils not swollen or erythematous. Hearing normal.  Neck: Supple, thyroid not palpable. No bruits, nodes or  JVD. Respiratory: Respiratory effort normal.  BS equal and clear bilateral without rales, rhonci, wheezing or stridor. Cardio: Heart sounds are normal with regular rate and rhythm and no murmurs, rubs or gallops. Peripheral pulses are normal and equal bilaterally without edema. No aortic or femoral bruits. Chest: symmetric with normal excursions and percussion. Breasts:  Deferred to [facility]. Abdomen: Flat, soft with bowel sounds active. Nontender, no guarding, rebound, hernias, masses, or organomegaly.  Lymphatics: Non tender without lymphadenopathy.  Musculoskeletal: Full ROM all peripheral extremities and  is in Wheelchair. Skin: Warm and dry without rashes, lesions, cyanosis, clubbing or  ecchymosis.  Neuro: Cranial nerves intact, reflexes equal bilaterally. Normal muscle tone, no cerebellar symptoms. Sensation intact.  Pysch: Alert and oriented X 3, normal affect, Insight and Judgment appropriate.    Assessment and Plan  1. Annual Preventative Screening Examination   2. Essential hypertension  - EKG 12-Lead - Urinalysis, Routine w reflex microscopic - Microalbumin / creatinine urine ratio - CBC with Differential/Platelet - COMPLETE METABOLIC PANEL WITH GFR - Magnesium - TSH  3. Hyperlipidemia, mixed  - EKG 12-Lead - Lipid panel - TSH  4. Abnormal glucose  - EKG 12-Lead - Hemoglobin A1c - Insulin, random  5. Vitamin D deficiency  - VITAMIN D 25 Hydroxy   6. Rheumatoid arthritis (HCC)   7. Hypothyroidism  - TSH  8. Aortic atherosclerosis (HCC) by CXR in 2021  - EKG 12-Lead - Lipid panel  9. Gastroesophageal reflux disease without esophagitis  - CBC with Differential/Platelet  10. Screening for colorectal cancer  - POC Hemoccult Bld/Stl  11. Screening for heart disease  - EKG 12-Lead  12. FHx: heart disease  - EKG 12-Lead  13. Medication management  - Urinalysis, Routine w reflex microscopic - Microalbumin / creatinine urine ratio -  CBC with  Differential/Platelet - COMPLETE METABOLIC PANEL WITH GFR - Magnesium - Lipid panel - TSH - Hemoglobin A1c - Insulin, random - VITAMIN D 25 Hydroxy          Patient was counseled in prudent diet to achieve/maintain BMI less than 25 for weight control, BP monitoring, regular exercise and medications. Discussed med's effects and SE's. Screening labs and tests as requested with regular follow-up as recommended. Over 40 minutes of exam, counseling, chart review and high complex critical decision making was performed.   Marinus Maw, MD

## 2023-07-25 ENCOUNTER — Encounter: Payer: Self-pay | Admitting: Internal Medicine

## 2023-07-25 ENCOUNTER — Ambulatory Visit (INDEPENDENT_AMBULATORY_CARE_PROVIDER_SITE_OTHER): Payer: Medicare PPO | Admitting: Internal Medicine

## 2023-07-25 VITALS — BP 124/70 | HR 61 | Temp 97.9°F | Resp 16 | Ht 61.0 in | Wt 141.0 lb

## 2023-07-25 DIAGNOSIS — D649 Anemia, unspecified: Secondary | ICD-10-CM | POA: Diagnosis not present

## 2023-07-25 DIAGNOSIS — R7309 Other abnormal glucose: Secondary | ICD-10-CM | POA: Diagnosis not present

## 2023-07-25 DIAGNOSIS — M069 Rheumatoid arthritis, unspecified: Secondary | ICD-10-CM

## 2023-07-25 DIAGNOSIS — E559 Vitamin D deficiency, unspecified: Secondary | ICD-10-CM

## 2023-07-25 DIAGNOSIS — Z0001 Encounter for general adult medical examination with abnormal findings: Secondary | ICD-10-CM

## 2023-07-25 DIAGNOSIS — I10 Essential (primary) hypertension: Secondary | ICD-10-CM

## 2023-07-25 DIAGNOSIS — Z0000 Encounter for general adult medical examination without abnormal findings: Secondary | ICD-10-CM

## 2023-07-25 DIAGNOSIS — E782 Mixed hyperlipidemia: Secondary | ICD-10-CM

## 2023-07-25 DIAGNOSIS — K219 Gastro-esophageal reflux disease without esophagitis: Secondary | ICD-10-CM | POA: Diagnosis not present

## 2023-07-25 DIAGNOSIS — Z79899 Other long term (current) drug therapy: Secondary | ICD-10-CM

## 2023-07-25 DIAGNOSIS — D696 Thrombocytopenia, unspecified: Secondary | ICD-10-CM | POA: Diagnosis not present

## 2023-07-25 DIAGNOSIS — E039 Hypothyroidism, unspecified: Secondary | ICD-10-CM | POA: Diagnosis not present

## 2023-07-25 DIAGNOSIS — M1990 Unspecified osteoarthritis, unspecified site: Secondary | ICD-10-CM | POA: Diagnosis not present

## 2023-07-25 DIAGNOSIS — S72142D Displaced intertrochanteric fracture of left femur, subsequent encounter for closed fracture with routine healing: Secondary | ICD-10-CM | POA: Diagnosis not present

## 2023-07-25 DIAGNOSIS — Z87891 Personal history of nicotine dependence: Secondary | ICD-10-CM

## 2023-07-25 DIAGNOSIS — I700 Atherosclerosis of aorta: Secondary | ICD-10-CM

## 2023-07-25 DIAGNOSIS — Z136 Encounter for screening for cardiovascular disorders: Secondary | ICD-10-CM

## 2023-07-25 DIAGNOSIS — G720 Drug-induced myopathy: Secondary | ICD-10-CM

## 2023-07-25 DIAGNOSIS — T466X5A Adverse effect of antihyperlipidemic and antiarteriosclerotic drugs, initial encounter: Secondary | ICD-10-CM

## 2023-07-25 DIAGNOSIS — Z8249 Family history of ischemic heart disease and other diseases of the circulatory system: Secondary | ICD-10-CM

## 2023-07-25 DIAGNOSIS — E119 Type 2 diabetes mellitus without complications: Secondary | ICD-10-CM | POA: Diagnosis not present

## 2023-07-25 DIAGNOSIS — Z1212 Encounter for screening for malignant neoplasm of rectum: Secondary | ICD-10-CM

## 2023-07-25 DIAGNOSIS — Z1211 Encounter for screening for malignant neoplasm of colon: Secondary | ICD-10-CM

## 2023-07-25 DIAGNOSIS — H269 Unspecified cataract: Secondary | ICD-10-CM | POA: Diagnosis not present

## 2023-07-25 MED ORDER — GABAPENTIN 600 MG PO TABS: ORAL_TABLET | ORAL | 3 refills | Status: DC

## 2023-07-26 LAB — CBC WITH DIFFERENTIAL/PLATELET
Absolute Monocytes: 599 {cells}/uL (ref 200–950)
Basophils Absolute: 50 {cells}/uL (ref 0–200)
Basophils Relative: 0.9 %
Eosinophils Absolute: 308 {cells}/uL (ref 15–500)
Eosinophils Relative: 5.5 %
HCT: 30.9 % — ABNORMAL LOW (ref 35.0–45.0)
Hemoglobin: 10.2 g/dL — ABNORMAL LOW (ref 11.7–15.5)
Lymphs Abs: 924 {cells}/uL (ref 850–3900)
MCH: 30.8 pg (ref 27.0–33.0)
MCHC: 33 g/dL (ref 32.0–36.0)
MCV: 93.4 fL (ref 80.0–100.0)
MPV: 12.3 fL (ref 7.5–12.5)
Monocytes Relative: 10.7 %
Neutro Abs: 3718 {cells}/uL (ref 1500–7800)
Neutrophils Relative %: 66.4 %
Platelets: 229 10*3/uL (ref 140–400)
RBC: 3.31 10*6/uL — ABNORMAL LOW (ref 3.80–5.10)
RDW: 13.3 % (ref 11.0–15.0)
Total Lymphocyte: 16.5 %
WBC: 5.6 10*3/uL (ref 3.8–10.8)

## 2023-07-26 LAB — COMPLETE METABOLIC PANEL WITH GFR
AG Ratio: 2.3 (calc) (ref 1.0–2.5)
ALT: 12 U/L (ref 6–29)
AST: 21 U/L (ref 10–35)
Albumin: 3.7 g/dL (ref 3.6–5.1)
Alkaline phosphatase (APISO): 104 U/L (ref 37–153)
BUN/Creatinine Ratio: 6 (calc) (ref 6–22)
BUN: 5 mg/dL — ABNORMAL LOW (ref 7–25)
CO2: 29 mmol/L (ref 20–32)
Calcium: 10.1 mg/dL (ref 8.6–10.4)
Chloride: 104 mmol/L (ref 98–110)
Creat: 0.81 mg/dL (ref 0.60–1.00)
Globulin: 1.6 g/dL — ABNORMAL LOW (ref 1.9–3.7)
Glucose, Bld: 95 mg/dL (ref 65–99)
Potassium: 4.3 mmol/L (ref 3.5–5.3)
Sodium: 139 mmol/L (ref 135–146)
Total Bilirubin: 0.8 mg/dL (ref 0.2–1.2)
Total Protein: 5.3 g/dL — ABNORMAL LOW (ref 6.1–8.1)
eGFR: 75 mL/min/{1.73_m2} (ref >=60)

## 2023-07-26 LAB — URINALYSIS, ROUTINE W REFLEX MICROSCOPIC
Bacteria, UA: NONE SEEN /HPF
Bilirubin Urine: NEGATIVE
Glucose, UA: NEGATIVE
Hgb urine dipstick: NEGATIVE
Hyaline Cast: NONE SEEN /LPF
Ketones, ur: NEGATIVE
Nitrite: NEGATIVE
Protein, ur: NEGATIVE
Specific Gravity, Urine: 1.008 (ref 1.001–1.035)
pH: 7 (ref 5.0–8.0)

## 2023-07-26 LAB — MICROSCOPIC MESSAGE

## 2023-07-26 LAB — MICROALBUMIN / CREATININE URINE RATIO
Creatinine, Urine: 38 mg/dL (ref 20–275)
Microalb, Ur: <0.2 mg/dL

## 2023-07-26 LAB — LIPID PANEL
Cholesterol: 169 mg/dL (ref <200)
HDL: 63 mg/dL (ref >=50)
LDL Cholesterol (Calc): 89 mg/dL
Non-HDL Cholesterol (Calc): 106 mg/dL (ref <130)
Total CHOL/HDL Ratio: 2.7 (calc) (ref <5.0)
Triglycerides: 81 mg/dL (ref <150)

## 2023-07-26 LAB — HEMOGLOBIN A1C
Hgb A1c MFr Bld: 5 %{Hb} (ref <5.7)
Mean Plasma Glucose: 97 mg/dL
eAG (mmol/L): 5.4 mmol/L

## 2023-07-26 LAB — MAGNESIUM: Magnesium: 1.8 mg/dL (ref 1.5–2.5)

## 2023-07-26 LAB — INSULIN, RANDOM: Insulin: 8 u[IU]/mL

## 2023-07-26 LAB — TSH: TSH: 2.99 m[IU]/L (ref 0.40–4.50)

## 2023-07-26 LAB — VITAMIN D 25 HYDROXY (VIT D DEFICIENCY, FRACTURES): Vit D, 25-Hydroxy: 78 ng/mL (ref 30–100)

## 2023-07-26 NOTE — Progress Notes (Signed)
<>*<>*<>*<>*<>*<>*<>*<>*<>*<>*<>*<>*<>*<>*<>*<>*<>*<>*<>*<>*<>*<>*<>*<>*<> <>*<>*<>*<>*<>*<>*<>*<>*<>*<>*<>*<>*<>*<>*<>*<>*<>*<>*<>*<>*<>*<>*<>*<>*<>  -  Test results slightly outside the reference range are not unusual. If there is anything important, I will review this with you,  otherwise it is considered normal test values.  If you have further questions,  please do not hesitate to contact me at the office or via My Chart.   <>*<>*<>*<>*<>*<>*<>*<>*<>*<>*<>*<>*<>*<>*<>*<>*<>*<>*<>*<>*<>*<>*<>*<>*<> <>*<>*<>*<>*<>*<>*<>*<>*<>*<>*<>*<>*<>*<>*<>*<>*<>*<>*<>*<>*<>*<>*<>*<>*<>  -  Mild chronic anemia Is Stable   <>*<>*<>*<>*<>*<>*<>*<>*<>*<>*<>*<>*<>*<>*<>*<>*<>*<>*<>*<>*<>*<>*<>*<>*<> <>*<>*<>*<>*<>*<>*<>*<>*<>*<>*<>*<>*<>*<>*<>*<>*<>*<>*<>*<>*<>*<>*<>*<>*<>  -   Magnesium  = 1.8 is  very  low   - goal is betw 2.0 - 2.5,   - So..........Marland Kitchen  Recommend that you take Magnesium 500 mg tablet 2 x /day EVERY day !   - also important to eat lots of  leafy green vegetables   - spinach - Kale - collards - greens - okra - asparagus - broccoli - quinoa   - squash - almonds - black, red, white beans -  peas - green beans  <>*<>*<>*<>*<>*<>*<>*<>*<>*<>*<>*<>*<>*<>*<>*<>*<>*<>*<>*<>*<>*<>*<>*<>*<> <>*<>*<>*<>*<>*<>*<>*<>*<>*<>*<>*<>*<>*<>*<>*<>*<>*<>*<>*<>*<>*<>*<>*<>*<>  -  Chol = 169 -  Excellent   - Very low risk for Heart Attack  / Stroke  <>*<>*<>*<>*<>*<>*<>*<>*<>*<>*<>*<>*<>*<>*<>*<>*<>*<>*<>*<>*<>*<>*<>*<>*<> <>*<>*<>*<>*<>*<>*<>*<>*<>*<>*<>*<>*<>*<>*<>*<>*<>*<>*<>*<>*<>*<>*<>*<>*<>  -  A1c - Normal - No Diabetes  - Great   <>*<>*<>*<>*<>*<>*<>*<>*<>*<>*<>*<>*<>*<>*<>*<>*<>*<>*<>*<>*<>*<>*<>*<>*<> <>*<>*<>*<>*<>*<>*<>*<>*<>*<>*<>*<>*<>*<>*<>*<>*<>*<>*<>*<>*<>*<>*<>*<>*<>  -  Vitamin D = 78 - Excellent -  Please keep dosage same    <>*<>*<>*<>*<>*<>*<>*<>*<>*<>*<>*<>*<>*<>*<>*<>*<>*<>*<>*<>*<>*<>*<>*<>*<> <>*<>*<>*<>*<>*<>*<>*<>*<>*<>*<>*<>*<>*<>*<>*<>*<>*<>*<>*<>*<>*<>*<>*<>*<>  -  All Else - CBC - Kidneys - Electrolytes - Liver - Magnesium & Thyroid    - all  Normal / OK  <>*<>*<>*<>*<>*<>*<>*<>*<>*<>*<>*<>*<>*<>*<>*<>*<>*<>*<>*<>*<>*<>*<>*<>*<> <>*<>*<>*<>*<>*<>*<>*<>*<>*<>*<>*<>*<>*<>*<>*<>*<>*<>*<>*<>*<>*<>*<>*<>*<>

## 2023-07-28 DIAGNOSIS — D649 Anemia, unspecified: Secondary | ICD-10-CM | POA: Diagnosis not present

## 2023-07-28 DIAGNOSIS — M069 Rheumatoid arthritis, unspecified: Secondary | ICD-10-CM | POA: Diagnosis not present

## 2023-07-28 DIAGNOSIS — M1990 Unspecified osteoarthritis, unspecified site: Secondary | ICD-10-CM | POA: Diagnosis not present

## 2023-07-28 DIAGNOSIS — S72142D Displaced intertrochanteric fracture of left femur, subsequent encounter for closed fracture with routine healing: Secondary | ICD-10-CM | POA: Diagnosis not present

## 2023-07-28 DIAGNOSIS — K219 Gastro-esophageal reflux disease without esophagitis: Secondary | ICD-10-CM | POA: Diagnosis not present

## 2023-07-28 DIAGNOSIS — E119 Type 2 diabetes mellitus without complications: Secondary | ICD-10-CM | POA: Diagnosis not present

## 2023-07-28 DIAGNOSIS — I700 Atherosclerosis of aorta: Secondary | ICD-10-CM | POA: Diagnosis not present

## 2023-07-28 DIAGNOSIS — H269 Unspecified cataract: Secondary | ICD-10-CM | POA: Diagnosis not present

## 2023-07-28 DIAGNOSIS — D696 Thrombocytopenia, unspecified: Secondary | ICD-10-CM | POA: Diagnosis not present

## 2023-08-02 DIAGNOSIS — K219 Gastro-esophageal reflux disease without esophagitis: Secondary | ICD-10-CM | POA: Diagnosis not present

## 2023-08-02 DIAGNOSIS — I700 Atherosclerosis of aorta: Secondary | ICD-10-CM | POA: Diagnosis not present

## 2023-08-02 DIAGNOSIS — E119 Type 2 diabetes mellitus without complications: Secondary | ICD-10-CM | POA: Diagnosis not present

## 2023-08-02 DIAGNOSIS — M069 Rheumatoid arthritis, unspecified: Secondary | ICD-10-CM | POA: Diagnosis not present

## 2023-08-02 DIAGNOSIS — H269 Unspecified cataract: Secondary | ICD-10-CM | POA: Diagnosis not present

## 2023-08-02 DIAGNOSIS — D649 Anemia, unspecified: Secondary | ICD-10-CM | POA: Diagnosis not present

## 2023-08-02 DIAGNOSIS — M1990 Unspecified osteoarthritis, unspecified site: Secondary | ICD-10-CM | POA: Diagnosis not present

## 2023-08-02 DIAGNOSIS — D696 Thrombocytopenia, unspecified: Secondary | ICD-10-CM | POA: Diagnosis not present

## 2023-08-02 DIAGNOSIS — S72142D Displaced intertrochanteric fracture of left femur, subsequent encounter for closed fracture with routine healing: Secondary | ICD-10-CM | POA: Diagnosis not present

## 2023-08-03 DIAGNOSIS — S72142D Displaced intertrochanteric fracture of left femur, subsequent encounter for closed fracture with routine healing: Secondary | ICD-10-CM | POA: Diagnosis not present

## 2023-08-10 DIAGNOSIS — S72142D Displaced intertrochanteric fracture of left femur, subsequent encounter for closed fracture with routine healing: Secondary | ICD-10-CM | POA: Diagnosis not present

## 2023-08-15 DIAGNOSIS — S72142D Displaced intertrochanteric fracture of left femur, subsequent encounter for closed fracture with routine healing: Secondary | ICD-10-CM | POA: Diagnosis not present

## 2023-08-17 DIAGNOSIS — S72142D Displaced intertrochanteric fracture of left femur, subsequent encounter for closed fracture with routine healing: Secondary | ICD-10-CM | POA: Diagnosis not present

## 2023-08-18 DIAGNOSIS — M0609 Rheumatoid arthritis without rheumatoid factor, multiple sites: Secondary | ICD-10-CM | POA: Diagnosis not present

## 2023-08-22 DIAGNOSIS — S72142D Displaced intertrochanteric fracture of left femur, subsequent encounter for closed fracture with routine healing: Secondary | ICD-10-CM | POA: Diagnosis not present

## 2023-08-24 DIAGNOSIS — S72142D Displaced intertrochanteric fracture of left femur, subsequent encounter for closed fracture with routine healing: Secondary | ICD-10-CM | POA: Diagnosis not present

## 2023-08-29 DIAGNOSIS — S72142D Displaced intertrochanteric fracture of left femur, subsequent encounter for closed fracture with routine healing: Secondary | ICD-10-CM | POA: Diagnosis not present

## 2023-09-01 DIAGNOSIS — S72145D Nondisplaced intertrochanteric fracture of left femur, subsequent encounter for closed fracture with routine healing: Secondary | ICD-10-CM | POA: Diagnosis not present

## 2023-09-01 DIAGNOSIS — S72142D Displaced intertrochanteric fracture of left femur, subsequent encounter for closed fracture with routine healing: Secondary | ICD-10-CM | POA: Diagnosis not present

## 2023-09-06 DIAGNOSIS — M25562 Pain in left knee: Secondary | ICD-10-CM | POA: Diagnosis not present

## 2023-09-06 DIAGNOSIS — M1712 Unilateral primary osteoarthritis, left knee: Secondary | ICD-10-CM | POA: Diagnosis not present

## 2023-09-06 DIAGNOSIS — S72142D Displaced intertrochanteric fracture of left femur, subsequent encounter for closed fracture with routine healing: Secondary | ICD-10-CM | POA: Diagnosis not present

## 2023-09-08 DIAGNOSIS — S72142D Displaced intertrochanteric fracture of left femur, subsequent encounter for closed fracture with routine healing: Secondary | ICD-10-CM | POA: Diagnosis not present

## 2023-09-09 NOTE — Care Plan (Signed)
Ortho Bundle Case Management Note  Patient Details  Name: [name] MRN: [id number] Date of Birth: [birth year]   Spoke with patient and husband in the office prior to surgery. She will discharge to home with husband to assist. Has DME at home. OPPT set up with [facility]. Patient and MD in agreement with plan. CHoice offered.                     DME Arranged:    DME Agency:     HH Arranged:    HH Agency:     Additional Comments: Please contact me with any questions of if this plan should need to change.  Shauna Hugh,  RN,BSN,MHA,CCM  [facility]  [phone] 09/09/2023, 12:11 PM

## 2023-09-09 NOTE — Progress Notes (Signed)
Sent message, via epic in basket, requesting orders in epic from surgeon.  

## 2023-09-12 ENCOUNTER — Other Ambulatory Visit: Payer: Self-pay | Admitting: Orthopedic Surgery

## 2023-09-12 DIAGNOSIS — R262 Difficulty in walking, not elsewhere classified: Secondary | ICD-10-CM | POA: Diagnosis not present

## 2023-09-12 DIAGNOSIS — M25562 Pain in left knee: Secondary | ICD-10-CM | POA: Diagnosis not present

## 2023-09-12 DIAGNOSIS — M1732 Unilateral post-traumatic osteoarthritis, left knee: Secondary | ICD-10-CM | POA: Diagnosis not present

## 2023-09-12 DIAGNOSIS — M25662 Stiffness of left knee, not elsewhere classified: Secondary | ICD-10-CM | POA: Diagnosis not present

## 2023-09-12 DIAGNOSIS — M1712 Unilateral primary osteoarthritis, left knee: Secondary | ICD-10-CM | POA: Diagnosis not present

## 2023-09-12 NOTE — Patient Instructions (Signed)
DUE TO COVID-19 ONLY TWO VISITORS  (aged 76 and older)  ARE ALLOWED TO COME WITH YOU AND STAY IN THE WAITING ROOM ONLY DURING PRE OP AND PROCEDURE.   **NO VISITORS ARE ALLOWED IN THE SHORT STAY AREA OR RECOVERY ROOM!!**  IF YOU WILL BE ADMITTED INTO THE HOSPITAL YOU ARE ALLOWED ONLY FOUR SUPPORT PEOPLE DURING VISITATION HOURS ONLY (7 AM -8PM)   The support person(s) must pass our screening, gel in and out, and wear a mask at all times, including in the patient's room. Patients must also wear a mask when staff or their support person are in the room. Visitors GUEST BADGE MUST BE WORN VISIBLY  One adult visitor may remain with you overnight and MUST be in the room by 8 P.M.     Your procedure is scheduled on: 09/14/23   Report to [facility] Main Entrance    Report to admitting at : 9:15 AM   Call this number if you have problems the morning of surgery [phone]   Do not eat food :After Midnight.   After Midnight you may have the following liquids until : 8:45 AM DAY OF SURGERY  Water Black Coffee (sugar ok, NO MILK/CREAM OR CREAMERS)  Tea (sugar ok, NO MILK/CREAM OR CREAMERS) regular and decaf                             Plain Jell-O (NO RED)                                           Fruit ices (not with fruit pulp, NO RED)                                     Popsicles (NO RED)                                                                  Juice: apple, WHITE grape, WHITE cranberry Sports drinks like Gatorade (NO RED)              FOLLOW ANY ADDITIONAL PRE OP INSTRUCTIONS YOU RECEIVED FROM YOUR SURGEON'S OFFICE!!!   Oral Hygiene is also important to reduce your risk of infection.                                    Remember - BRUSH YOUR TEETH THE MORNING OF SURGERY WITH YOUR REGULAR TOOTHPASTE  DENTURES WILL BE REMOVED PRIOR TO SURGERY PLEASE DO NOT APPLY "Poly grip" OR ADHESIVES!!!   Do NOT smoke after Midnight   Take these medicines the morning of surgery with A  SIP OF WATER: gabapentin,levothyroxine.Omeprazole as needed.  DO NOT TAKE ANY ORAL DIABETIC MEDICATIONS DAY OF YOUR SURGERY                              You may not have any metal on your body including hair pins,  jewelry, and body piercing             Do not wear make-up, lotions, powders, perfumes/cologne, or deodorant  Do not wear nail polish including gel and S&S, artificial/acrylic nails, or any other type of covering on natural nails including finger and toenails. If you have artificial nails, gel coating, etc. that needs to be removed by a nail salon please have this removed prior to surgery or surgery may need to be canceled/ delayed if the surgeon/ anesthesia feels like they are unable to be safely monitored.   Do not shave  48 hours prior to surgery.    Do not bring valuables to the hospital. [place] IS NOT             RESPONSIBLE   FOR VALUABLES.   Contacts, glasses, or bridgework may not be worn into surgery.   Bring small overnight bag day of surgery.   DO NOT BRING YOUR HOME MEDICATIONS TO THE HOSPITAL. PHARMACY WILL DISPENSE MEDICATIONS LISTED ON YOUR MEDICATION LIST TO YOU DURING YOUR ADMISSION IN THE HOSPITAL!    Patients discharged on the day of surgery will not be allowed to drive home.  Someone NEEDS to stay with you for the first 24 hours after anesthesia.   Special Instructions: Bring a copy of your healthcare power of attorney and living will documents         the day of surgery if you haven't scanned them before.              Please read over the following fact sheets you were given: IF YOU HAVE QUESTIONS ABOUT YOUR PRE-OP INSTRUCTIONS PLEASE CALL [phone] [facility] - Preparing for Surgery Before surgery, you can play an important role.  Because skin is not sterile, your skin needs to be as free of germs as possible.  You can reduce the number of germs on your skin by washing with CHG (chlorahexidine gluconate) soap before surgery.  CHG is an antiseptic  cleaner which kills germs and bonds with the skin to continue killing germs even after washing. Please DO NOT use if you have an allergy to CHG or antibacterial soaps.  If your skin becomes reddened/irritated stop using the CHG and inform your nurse when you arrive at Short Stay. Do not shave (including legs and underarms) for at least 48 hours prior to the first CHG shower.  You may shave your face/neck. Please follow these instructions carefully:  1.  Shower with CHG Soap the night before surgery and the  morning of Surgery.  2.  If you choose to wash your hair, wash your hair first as usual with your  normal  shampoo.  3.  After you shampoo, rinse your hair and body thoroughly to remove the  shampoo.                           4.  Use CHG as you would any other liquid soap.  You can apply chg directly  to the skin and wash                       Gently with a scrungie or clean washcloth.  5.  Apply the CHG Soap to your body ONLY FROM THE NECK DOWN.   Do not use on face/ open  Wound or open sores. Avoid contact with eyes, ears mouth and genitals (private parts).                       Wash face,  Genitals (private parts) with your normal soap.             6.  Wash thoroughly, paying special attention to the area where your surgery  will be performed.  7.  Thoroughly rinse your body with warm water from the neck down.  8.  DO NOT shower/wash with your normal soap after using and rinsing off  the CHG Soap.                9.  Pat yourself dry with a clean towel.            10.  Wear clean pajamas.            11.  Place clean sheets on your bed the night of your first shower and do not  sleep with pets. Day of Surgery : Do not apply any lotions/deodorants the morning of surgery.  Please wear clean clothes to the hospital/surgery center.  FAILURE TO FOLLOW THESE INSTRUCTIONS MAY RESULT IN THE CANCELLATION OF YOUR SURGERY PATIENT  SIGNATURE_________________________________  NURSE SIGNATURE__________________________________  ________________________________________________________________________

## 2023-09-13 ENCOUNTER — Encounter (HOSPITAL_COMMUNITY): Payer: Self-pay

## 2023-09-13 ENCOUNTER — Encounter (HOSPITAL_COMMUNITY)
Admission: RE | Admit: 2023-09-13 | Discharge: 2023-09-13 | Disposition: A | Discharge status: Home / Self Care | Payer: Medicare PPO | Source: Ambulatory Visit | Attending: Orthopedic Surgery | Admitting: Orthopedic Surgery

## 2023-09-13 ENCOUNTER — Other Ambulatory Visit: Payer: Self-pay

## 2023-09-13 VITALS — BP 159/77 | HR 59 | Temp 97.9°F | Ht 61.0 in | Wt 134.0 lb

## 2023-09-13 DIAGNOSIS — Z01812 Encounter for preprocedural laboratory examination: Secondary | ICD-10-CM | POA: Insufficient documentation

## 2023-09-13 DIAGNOSIS — Z01818 Encounter for other preprocedural examination: Secondary | ICD-10-CM

## 2023-09-13 DIAGNOSIS — I10 Essential (primary) hypertension: Secondary | ICD-10-CM | POA: Diagnosis not present

## 2023-09-13 DIAGNOSIS — E039 Hypothyroidism, unspecified: Secondary | ICD-10-CM | POA: Diagnosis not present

## 2023-09-13 DIAGNOSIS — I700 Atherosclerosis of aorta: Secondary | ICD-10-CM | POA: Diagnosis not present

## 2023-09-13 DIAGNOSIS — Z8711 Personal history of peptic ulcer disease: Secondary | ICD-10-CM | POA: Diagnosis not present

## 2023-09-13 DIAGNOSIS — S72142D Displaced intertrochanteric fracture of left femur, subsequent encounter for closed fracture with routine healing: Secondary | ICD-10-CM | POA: Diagnosis not present

## 2023-09-13 DIAGNOSIS — M069 Rheumatoid arthritis, unspecified: Secondary | ICD-10-CM | POA: Diagnosis not present

## 2023-09-13 DIAGNOSIS — E785 Hyperlipidemia, unspecified: Secondary | ICD-10-CM | POA: Insufficient documentation

## 2023-09-13 DIAGNOSIS — K219 Gastro-esophageal reflux disease without esophagitis: Secondary | ICD-10-CM | POA: Insufficient documentation

## 2023-09-13 LAB — CBC
HCT: 38.2 % (ref 36.0–46.0)
Hemoglobin: 12.1 g/dL (ref 12.0–15.0)
MCH: 29.9 pg (ref 26.0–34.0)
MCHC: 31.7 g/dL (ref 30.0–36.0)
MCV: 94.3 fL (ref 80.0–100.0)
Platelets: 147 10*3/uL — ABNORMAL LOW (ref 150–400)
RBC: 4.05 MIL/uL (ref 3.87–5.11)
RDW: 14.2 % (ref 11.5–15.5)
WBC: 6 10*3/uL (ref 4.0–10.5)
nRBC: 0 % (ref 0.0–0.2)

## 2023-09-13 LAB — BASIC METABOLIC PANEL
Anion gap: 8 (ref 5–15)
BUN: 6 mg/dL — ABNORMAL LOW (ref 8–23)
CO2: 23 mmol/L (ref 22–32)
Calcium: 10 mg/dL (ref 8.9–10.3)
Chloride: 108 mmol/L (ref 98–111)
Creatinine, Ser: 0.6 mg/dL (ref 0.44–1.00)
GFR, Estimated: >60 mL/min (ref >60)
Glucose, Bld: 92 mg/dL (ref 70–99)
Potassium: 4.6 mmol/L (ref 3.5–5.1)
Sodium: 139 mmol/L (ref 135–145)

## 2023-09-13 LAB — SURGICAL PCR SCREEN
MRSA, PCR: NEGATIVE
Staphylococcus aureus: NEGATIVE

## 2023-09-13 NOTE — Progress Notes (Signed)
For Anesthesia: PCP - Lucky Cowboy, MD  Cardiologist - Little Ishikawa, MD   Bowel Prep reminder:  Chest x-ray - 07/10/23 EKG - 07/06/23 Stress Test -  ECHO - 06/08/21 Cardiac Cath -  Pacemaker/ICD device last checked: Pacemaker orders received: Device Rep notified:  Spinal Cord Stimulator: N/A  Sleep Study - N/A CPAP -   Fasting Blood Sugar - N/A Checks Blood Sugar _____ times a day Date and result of last Hgb A1c-  Last dose of GLP1 agonist- N/A GLP1 instructions:   Last dose of SGLT-2 inhibitors- N/A SGLT-2 instructions:   Blood Thinner Instructions:  Aspirin Instructions: On Hold since 09/09/23 Last Dose:  Activity level: Can go up a flight of stairs and activities of daily living without stopping and without chest pain and/or shortness of breath   Able to exercise without chest pain and/or shortness of breath  Anesthesia review: Hx: HTN,Heart Murmur  Patient denies shortness of breath, fever, cough and chest pain at PAT appointment   Patient verbalized understanding of instructions that were given to them at the PAT appointment. Patient was also instructed that they will need to review over the PAT instructions again at home before surgery.

## 2023-09-13 NOTE — Progress Notes (Signed)
DISCUSSION: [name] is a 77 yo female who presents to PAT prior to L TKA on 09/14/23 with Dr. Luiz Blare. PMH of former smoking, aortic atherosclerosis (by CXR), HTN, HLD, heart murmur, PUD, GERD, hypothyroid, anemia, RA on methotrexate and plaquenil.  Patient recently was admitted for mechnical fall and L sided hip fx from 07/15/23-07/17/23. She underwent intramedullary nail on 9/7 by Dr. Hulda Humphrey. No anesthesia complications and post-op course was uncomplicated. She has followed up with PCP after this admission, last seen on 07/25/23. All issues stable.  Follows with Rheumatology for hx of RA. Maintained on methotrexate and plaquenil  VS: BP (!) 159/77   Pulse (!) 59   Temp 36.6 C (Oral)   Ht 5\' 1"  (1.549 m)   SpO2 100%   BMI 26.64 kg/m   PROVIDERS: Lucky Cowboy, MD   LABS: Labs reviewed: Acceptable for surgery. (all labs ordered are listed, but only abnormal results are displayed)  Labs Reviewed  SURGICAL PCR SCREEN  BASIC METABOLIC PANEL  CBC     IMAGES:   EKG:   CV:  Heart monitor 08/12/2021:  10 episodes of SVT, longest lasting 7 beats Episode of dizziness reported with heart rate 48 bpm, sinus bradycardia. Average heart rate 58 bpm.     Patch Wear Time:  14 days and 0 hours (2022-09-16T10:50:09-399 to 2022-09-30T10:50:15-0400)   Patient had a min HR of 40 bpm, max HR of 162 bpm, and avg HR of 58 bpm. Predominant underlying rhythm was Sinus Rhythm. 10 Supraventricular Tachycardia runs occurred, the run with the fastest interval lasting 5 beats with a max rate of 162 bpm, the  longest lasting 7 beats with an avg rate of 128 bpm. Idioventricular Rhythm was present. Isolated SVEs were rare (<1.0%), SVE Couplets were rare (<1.0%), and SVE Triplets were rare (<1.0%). Isolated VEs were rare (<1.0%, 291), VE Couplets were rare  (<1.0%, 8), and VE Triplets were rare (<1.0%, 2). Ventricular Trigeminy was present.  3 patient triggered events, corresponding to sinus rhythm   PACs   Echo 06/08/2021:  IMPRESSIONS     1. Left ventricular ejection fraction, by estimation, is 60 to 65%. The  left ventricle has normal function. The left ventricle has no regional  wall motion abnormalities. There is mild left ventricular hypertrophy.  Left ventricular diastolic parameters  are consistent with Grade II diastolic dysfunction (pseudonormalization).  Elevated left atrial pressure.   2. Right ventricular systolic function is normal. The right ventricular  size is normal. Tricuspid regurgitation signal is inadequate for assessing  PA pressure.   3. Left atrial size was severely dilated.   4. The mitral valve is normal in structure. No evidence of mitral valve  regurgitation. No evidence of mitral stenosis.   5. The aortic valve is tricuspid. Aortic valve regurgitation is not  visualized. No aortic stenosis is present.   6. The inferior vena cava is normal in size with greater than 50%  respiratory variability, suggesting right atrial pressure of 3 mmHg.   Conclusion(s)/Recommendation(s): No vegetation seen. If high clinical  suspicion for endocarditis, consider TEE.   NM Stress test 07/29/2015:  Nuclear stress EF: 64%. The study is normal. This is a low risk study. The left ventricular ejection fraction is normal (55-65%).   Nl Lexiscan MV   Past Medical History:  Diagnosis Date   Anemia    Hx of    Arthritis    Calculus of bile duct without mention of cholecystitis or obstruction    Cataract  GERD (gastroesophageal reflux disease)    H. pylori infection    Hx of    Heart murmur    Hyperlipidemia    Hypertension    Hypothyroid    PUD (peptic ulcer disease)    Rheumatoid arthritis(714.0)     Past Surgical History:  Procedure Laterality Date   AV FISTULA REPAIR     BLADDER SUSPENSION     BREAST CYST EXCISION Left    BUNIONECTOMY     CARPAL TUNNEL RELEASE     CHOLECYSTECTOMY  09/07/2011   CYSTECTOMY     left breast   ERCP  09/29/2011    Procedure: ENDOSCOPIC RETROGRADE CHOLANGIOPANCREATOGRAPHY (ERCP);  Surgeon: Louis Meckel, MD;  Location: [facility];  Service: Endoscopy;  Laterality: N/A;   INTRAMEDULLARY (IM) NAIL INTERTROCHANTERIC Left 07/15/2023   Procedure: INTRAMEDULLARY (IM) NAIL INTERTROCHANTERIC;  Surgeon: Luci Bank, MD;  Location: [facility];  Service: Orthopedics;  Laterality: Left;   PILONIDAL CYST EXCISION     TENOLYSIS  10/26/2011   Procedure: TENDON SHEATH RELEASE/TENOLYSIS;  Surgeon: Nicki Reaper, MD;  Location: [facility];  Service: Orthopedics;  Laterality: Right;  tenosynovectomy removal superficialis slip right index finger   TONSILLECTOMY     TOTAL SHOULDER REPLACEMENT Right 02/22/2014   Dr. Malka So at [facility] RELEASE     VAGINAL HYSTERECTOMY     ovaries not removed    MEDICATIONS:  Ascorbic Acid (VITAMIN C) 1000 MG tablet   aspirin 81 MG tablet   Cholecalciferol (VITAMIN D3) 125 MCG (5000 UT) CAPS   folic acid (FOLVITE) 1 MG tablet   gabapentin (NEURONTIN) 600 MG tablet   HYDROcodone-acetaminophen (NORCO/VICODIN) 5-325 MG tablet   hydroxychloroquine (PLAQUENIL) 200 MG tablet   ibuprofen (IBU) 800 MG tablet   levothyroxine (SYNTHROID) 88 MCG tablet   magnesium gluconate (MAGONATE) 500 MG tablet   methotrexate 50 MG/2ML injection   omeprazole (PRILOSEC) 40 MG capsule   traZODone (DESYREL) 150 MG tablet   zinc gluconate 50 MG tablet   No current facility-administered medications for this encounter.   [name] [facility] Short Stay/Anesthesiology [facility] Phone [phone] 09/13/2023 9:14 AM

## 2023-09-13 NOTE — Anesthesia Preprocedure Evaluation (Signed)
Anesthesia Evaluation  Patient identified by MRN, date of birth, ID band Patient awake    Reviewed: Allergy & Precautions, NPO status , Patient's Chart, lab work & pertinent test results  Airway Mallampati: II  TM Distance: >3 FB Neck ROM: Full    Dental no notable dental hx. (+) Edentulous Upper, Edentulous Lower   Pulmonary former smoker   Pulmonary exam normal breath sounds clear to auscultation       Cardiovascular hypertension, Pt. on medications Normal cardiovascular exam Rhythm:Regular Rate:Normal     Neuro/Psych  PSYCHIATRIC DISORDERS  Depression     Neuromuscular disease    GI/Hepatic Neg liver ROS, PUD,GERD  Medicated and Controlled,,  Endo/Other  Hypothyroidism    Renal/GU negative Renal ROS     Musculoskeletal  (+) Arthritis ,    Abdominal   Peds  Hematology negative hematology ROS (+)   Anesthesia Other Findings   Reproductive/Obstetrics                             Anesthesia Physical Anesthesia Plan  ASA: 3  Anesthesia Plan: Spinal and Regional   Post-op Pain Management: Regional block*   Induction: Intravenous  PONV Risk Score and Plan: 3 and Ondansetron and Treatment may vary due to age or medical condition  Airway Management Planned: Natural Airway  Additional Equipment:   Intra-op Plan:   Post-operative Plan: Extubation in OR  Informed Consent: I have reviewed the patients History and Physical, chart, labs and discussed the procedure including the risks, benefits and alternatives for the proposed anesthesia with the patient or authorized representative who has indicated his/her understanding and acceptance.     Dental advisory given  Plan Discussed with: CRNA  Anesthesia Plan Comments: (See PAT note from 11/5 by K Gekas PA-C [name] is a 77 yo female who presents to PAT prior to L TKA on 09/14/23 with Dr. Luiz Blare. PMH of former smoking, aortic  atherosclerosis (by CXR), HTN, HLD, heart murmur, PUD, GERD, hypothyroid, anemia, RA on methotrexate and plaquenil.   Patient recently was admitted for mechnical fall and L sided hip fx from 07/15/23-07/17/23. She underwent intramedullary nail on 9/7 by Dr. Hulda Humphrey. No anesthesia complications and post-op course was uncomplicated. She has followed up with PCP after this admission, last seen on 07/25/23. All issues stable. )        Anesthesia Quick Evaluation

## 2023-09-14 ENCOUNTER — Observation Stay (HOSPITAL_COMMUNITY)
Admission: RE | Admit: 2023-09-14 | Discharge: 2023-09-15 | Disposition: A | Discharge status: Home / Self Care | Payer: Medicare PPO | Attending: Orthopedic Surgery | Admitting: Orthopedic Surgery

## 2023-09-14 ENCOUNTER — Encounter (HOSPITAL_COMMUNITY)
Admission: RE | Disposition: A | Discharge status: Home / Self Care | Payer: Self-pay | Source: Home / Self Care | Attending: Orthopedic Surgery

## 2023-09-14 ENCOUNTER — Other Ambulatory Visit: Payer: Self-pay

## 2023-09-14 ENCOUNTER — Encounter (HOSPITAL_COMMUNITY): Payer: Self-pay | Admitting: Orthopedic Surgery

## 2023-09-14 ENCOUNTER — Other Ambulatory Visit (HOSPITAL_COMMUNITY): Payer: Self-pay

## 2023-09-14 ENCOUNTER — Ambulatory Visit (HOSPITAL_COMMUNITY): Payer: Self-pay | Admitting: Medical

## 2023-09-14 ENCOUNTER — Ambulatory Visit (HOSPITAL_COMMUNITY): Payer: Medicare PPO

## 2023-09-14 DIAGNOSIS — M179 Osteoarthritis of knee, unspecified: Secondary | ICD-10-CM | POA: Diagnosis present

## 2023-09-14 DIAGNOSIS — S72436A Nondisplaced fracture of medial condyle of unspecified femur, initial encounter for closed fracture: Secondary | ICD-10-CM | POA: Diagnosis not present

## 2023-09-14 DIAGNOSIS — E039 Hypothyroidism, unspecified: Secondary | ICD-10-CM

## 2023-09-14 DIAGNOSIS — X58XXXA Exposure to other specified factors, initial encounter: Secondary | ICD-10-CM | POA: Insufficient documentation

## 2023-09-14 DIAGNOSIS — M84352A Stress fracture, left femur, initial encounter for fracture: Secondary | ICD-10-CM

## 2023-09-14 DIAGNOSIS — M069 Rheumatoid arthritis, unspecified: Secondary | ICD-10-CM | POA: Diagnosis not present

## 2023-09-14 DIAGNOSIS — K219 Gastro-esophageal reflux disease without esophagitis: Secondary | ICD-10-CM | POA: Insufficient documentation

## 2023-09-14 DIAGNOSIS — M810 Age-related osteoporosis without current pathological fracture: Secondary | ICD-10-CM | POA: Diagnosis present

## 2023-09-14 DIAGNOSIS — J45909 Unspecified asthma, uncomplicated: Secondary | ICD-10-CM | POA: Diagnosis not present

## 2023-09-14 DIAGNOSIS — S72432A Displaced fracture of medial condyle of left femur, initial encounter for closed fracture: Secondary | ICD-10-CM | POA: Diagnosis not present

## 2023-09-14 DIAGNOSIS — M1712 Unilateral primary osteoarthritis, left knee: Secondary | ICD-10-CM | POA: Diagnosis not present

## 2023-09-14 DIAGNOSIS — G8918 Other acute postprocedural pain: Secondary | ICD-10-CM | POA: Diagnosis not present

## 2023-09-14 DIAGNOSIS — S82126A Nondisplaced fracture of lateral condyle of unspecified tibia, initial encounter for closed fracture: Secondary | ICD-10-CM | POA: Insufficient documentation

## 2023-09-14 DIAGNOSIS — S76192A Other specified injury of left quadriceps muscle, fascia and tendon, initial encounter: Secondary | ICD-10-CM | POA: Diagnosis not present

## 2023-09-14 DIAGNOSIS — S82122A Displaced fracture of lateral condyle of left tibia, initial encounter for closed fracture: Secondary | ICD-10-CM | POA: Diagnosis not present

## 2023-09-14 DIAGNOSIS — I10 Essential (primary) hypertension: Secondary | ICD-10-CM | POA: Diagnosis not present

## 2023-09-14 DIAGNOSIS — Z96652 Presence of left artificial knee joint: Secondary | ICD-10-CM | POA: Diagnosis not present

## 2023-09-14 HISTORY — PX: TOTAL KNEE ARTHROPLASTY: SHX125

## 2023-09-14 SURGERY — ARTHROPLASTY, KNEE, TOTAL
Anesthesia: Regional | Site: Knee | Laterality: Left

## 2023-09-14 MED ORDER — BUPIVACAINE LIPOSOME 1.3 % IJ SUSP
INTRAMUSCULAR | Status: AC
  Filled 2023-09-14: qty 20

## 2023-09-14 MED ORDER — DEXAMETHASONE SODIUM PHOSPHATE 10 MG/ML IJ SOLN
10.0000 mg | Freq: Two times a day (BID) | INTRAMUSCULAR | Status: AC
  Administered 2023-09-14 – 2023-09-15 (×2): 10 mg via INTRAVENOUS
  Filled 2023-09-14 (×2): qty 1

## 2023-09-14 MED ORDER — PHENYLEPHRINE 80 MCG/ML (10ML) SYRINGE FOR IV PUSH (FOR BLOOD PRESSURE SUPPORT)
PREFILLED_SYRINGE | INTRAVENOUS | Status: AC
  Filled 2023-09-14: qty 10

## 2023-09-14 MED ORDER — POLYETHYLENE GLYCOL 3350 17 G PO PACK: 17.0000 g | PACK | Freq: Every day | ORAL | Status: DC | PRN

## 2023-09-14 MED ORDER — BUPIVACAINE LIPOSOME 1.3 % IJ SUSP
INTRAMUSCULAR | Status: DC | PRN
  Administered 2023-09-14: 20 mL

## 2023-09-14 MED ORDER — ONDANSETRON HCL 4 MG/2ML IJ SOLN: 4.0000 mg | Freq: Four times a day (QID) | INTRAMUSCULAR | Status: DC | PRN

## 2023-09-14 MED ORDER — DROPERIDOL 2.5 MG/ML IJ SOLN: 0.6250 mg | Freq: Once | INTRAMUSCULAR | Status: DC | PRN

## 2023-09-14 MED ORDER — WATER FOR IRRIGATION, STERILE IR SOLN
Status: DC | PRN
  Administered 2023-09-14: 1000 mL

## 2023-09-14 MED ORDER — LACTATED RINGERS IV SOLN
INTRAVENOUS | Status: DC
Start: 2023-09-14 — End: 2023-09-14

## 2023-09-14 MED ORDER — DOCUSATE SODIUM 100 MG PO CAPS
100.0000 mg | ORAL_CAPSULE | Freq: Two times a day (BID) | ORAL | Status: DC
  Administered 2023-09-14 – 2023-09-15 (×2): 100 mg via ORAL
  Filled 2023-09-14 (×2): qty 1

## 2023-09-14 MED ORDER — TRANEXAMIC ACID-NACL 1000-0.7 MG/100ML-% IV SOLN
1000.0000 mg | Freq: Once | INTRAVENOUS | Status: AC
  Administered 2023-09-14: 1000 mg via INTRAVENOUS
  Filled 2023-09-14: qty 100

## 2023-09-14 MED ORDER — ROPIVACAINE HCL 5 MG/ML IJ SOLN
INTRAMUSCULAR | Status: DC | PRN
  Administered 2023-09-14: 20 mL via EPIDURAL

## 2023-09-14 MED ORDER — METHOCARBAMOL 500 MG PO TABS
500.0000 mg | ORAL_TABLET | Freq: Four times a day (QID) | ORAL | Status: DC | PRN
  Administered 2023-09-14: 500 mg via ORAL
  Filled 2023-09-14 (×2): qty 1

## 2023-09-14 MED ORDER — CELECOXIB 200 MG PO CAPS
200.0000 mg | ORAL_CAPSULE | Freq: Every day | ORAL | Status: DC
  Administered 2023-09-14 – 2023-09-15 (×2): 200 mg via ORAL
  Filled 2023-09-14 (×2): qty 1

## 2023-09-14 MED ORDER — LEVOTHYROXINE SODIUM 88 MCG PO TABS
88.0000 ug | ORAL_TABLET | Freq: Every day | ORAL | Status: DC
  Administered 2023-09-15: 88 ug via ORAL
  Filled 2023-09-14: qty 1

## 2023-09-14 MED ORDER — BUPIVACAINE-EPINEPHRINE 0.5% -1:200000 IJ SOLN
INTRAMUSCULAR | Status: DC | PRN
  Administered 2023-09-14: 30 mL

## 2023-09-14 MED ORDER — DEXTROSE-SODIUM CHLORIDE 5-0.45 % IV SOLN: INTRAVENOUS | Status: DC

## 2023-09-14 MED ORDER — BISACODYL 5 MG PO TBEC: 5.0000 mg | DELAYED_RELEASE_TABLET | Freq: Every day | ORAL | Status: DC | PRN

## 2023-09-14 MED ORDER — ONDANSETRON HCL 4 MG PO TABS: 4.0000 mg | ORAL_TABLET | Freq: Four times a day (QID) | ORAL | Status: DC | PRN

## 2023-09-14 MED ORDER — METHOCARBAMOL 1000 MG/10ML IJ SOLN: 500.0000 mg | Freq: Four times a day (QID) | INTRAMUSCULAR | Status: DC | PRN

## 2023-09-14 MED ORDER — BUPIVACAINE-EPINEPHRINE (PF) 0.5% -1:200000 IJ SOLN
INTRAMUSCULAR | Status: AC
  Filled 2023-09-14: qty 30

## 2023-09-14 MED ORDER — FENTANYL CITRATE PF 50 MCG/ML IJ SOSY: 50.0000 ug | PREFILLED_SYRINGE | Freq: Once | INTRAMUSCULAR | Status: AC

## 2023-09-14 MED ORDER — CHLORHEXIDINE GLUCONATE 0.12 % MT SOLN
15.0000 mL | Freq: Once | OROMUCOSAL | Status: AC
  Administered 2023-09-14: 15 mL via OROMUCOSAL

## 2023-09-14 MED ORDER — PROPOFOL 1000 MG/100ML IV EMUL
INTRAVENOUS | Status: AC
Start: 2023-09-14 — End: ?
  Filled 2023-09-14: qty 100

## 2023-09-14 MED ORDER — DEXMEDETOMIDINE HCL IN NACL 80 MCG/20ML IV SOLN
INTRAVENOUS | Status: DC | PRN
  Administered 2023-09-14: 8 ug via INTRAVENOUS

## 2023-09-14 MED ORDER — MIDAZOLAM HCL 2 MG/2ML IJ SOLN
INTRAMUSCULAR | Status: AC
  Filled 2023-09-14: qty 2

## 2023-09-14 MED ORDER — CELECOXIB 200 MG PO CAPS
200.0000 mg | ORAL_CAPSULE | Freq: Every day | ORAL | 0 refills | Status: DC
  Filled 2023-09-14: qty 30, 30d supply, fill #0

## 2023-09-14 MED ORDER — GABAPENTIN 300 MG PO CAPS
600.0000 mg | ORAL_CAPSULE | Freq: Two times a day (BID) | ORAL | Status: DC
Start: 2023-09-14 — End: 2023-09-15
  Administered 2023-09-14 – 2023-09-15 (×2): 600 mg via ORAL
  Filled 2023-09-14 (×2): qty 2

## 2023-09-14 MED ORDER — EPHEDRINE 5 MG/ML INJ
INTRAVENOUS | Status: AC
  Filled 2023-09-14: qty 5

## 2023-09-14 MED ORDER — FENTANYL CITRATE PF 50 MCG/ML IJ SOSY
PREFILLED_SYRINGE | INTRAMUSCULAR | Status: AC
  Administered 2023-09-14: 50 ug via INTRAVENOUS
  Filled 2023-09-14: qty 2

## 2023-09-14 MED ORDER — MORPHINE SULFATE (PF) 2 MG/ML IV SOLN
0.5000 mg | INTRAVENOUS | Status: DC | PRN
  Administered 2023-09-14 (×2): 1 mg via INTRAVENOUS
  Filled 2023-09-14 (×2): qty 1

## 2023-09-14 MED ORDER — PROPOFOL 10 MG/ML IV BOLUS
INTRAVENOUS | Status: DC | PRN
  Administered 2023-09-14 (×2): 30 mg via INTRAVENOUS

## 2023-09-14 MED ORDER — PROPOFOL 500 MG/50ML IV EMUL
INTRAVENOUS | Status: AC
Start: 2023-09-14 — End: ?
  Filled 2023-09-14: qty 50

## 2023-09-14 MED ORDER — ONDANSETRON HCL 4 MG/2ML IJ SOLN
INTRAMUSCULAR | Status: DC | PRN
  Administered 2023-09-14: 4 mg via INTRAVENOUS

## 2023-09-14 MED ORDER — DEXMEDETOMIDINE HCL IN NACL 80 MCG/20ML IV SOLN
INTRAVENOUS | Status: AC
  Filled 2023-09-14: qty 20

## 2023-09-14 MED ORDER — LIDOCAINE HCL (CARDIAC) PF 100 MG/5ML IV SOSY
PREFILLED_SYRINGE | INTRAVENOUS | Status: DC | PRN
  Administered 2023-09-14: 40 mg via INTRAVENOUS

## 2023-09-14 MED ORDER — HYDROXYCHLOROQUINE SULFATE 200 MG PO TABS
200.0000 mg | ORAL_TABLET | Freq: Two times a day (BID) | ORAL | Status: DC
  Administered 2023-09-14 – 2023-09-15 (×2): 200 mg via ORAL
  Filled 2023-09-14 (×2): qty 1

## 2023-09-14 MED ORDER — ASPIRIN 81 MG PO CHEW
81.0000 mg | CHEWABLE_TABLET | Freq: Two times a day (BID) | ORAL | Status: DC
  Administered 2023-09-14 – 2023-09-15 (×2): 81 mg via ORAL
  Filled 2023-09-14 (×2): qty 1

## 2023-09-14 MED ORDER — CEFAZOLIN SODIUM-DEXTROSE 2-4 GM/100ML-% IV SOLN
2.0000 g | INTRAVENOUS | Status: AC
  Administered 2023-09-14: 2 g via INTRAVENOUS
  Filled 2023-09-14: qty 100

## 2023-09-14 MED ORDER — BUPIVACAINE IN DEXTROSE 0.75-8.25 % IT SOLN
INTRATHECAL | Status: DC | PRN
  Administered 2023-09-14: 1.6 mL via INTRATHECAL

## 2023-09-14 MED ORDER — POVIDONE-IODINE 10 % EX SWAB: 2.0000 | Freq: Once | CUTANEOUS | Status: DC

## 2023-09-14 MED ORDER — OXYCODONE HCL 5 MG/5ML PO SOLN: 5.0000 mg | Freq: Once | ORAL | Status: DC | PRN

## 2023-09-14 MED ORDER — PROPOFOL 500 MG/50ML IV EMUL
INTRAVENOUS | Status: DC | PRN
  Administered 2023-09-14: 75 ug/kg/min via INTRAVENOUS

## 2023-09-14 MED ORDER — BUPIVACAINE LIPOSOME 1.3 % IJ SUSP: 20.0000 mL | Freq: Once | INTRAMUSCULAR | Status: DC

## 2023-09-14 MED ORDER — ACETAMINOPHEN 325 MG PO TABS
325.0000 mg | ORAL_TABLET | Freq: Four times a day (QID) | ORAL | Status: DC | PRN
  Administered 2023-09-15: 650 mg via ORAL
  Filled 2023-09-14: qty 2

## 2023-09-14 MED ORDER — DOCUSATE SODIUM 100 MG PO CAPS
100.0000 mg | ORAL_CAPSULE | Freq: Two times a day (BID) | ORAL | 0 refills | Status: DC
Start: 2023-09-14 — End: 2023-10-27
  Filled 2023-09-14: qty 30, 15d supply, fill #0

## 2023-09-14 MED ORDER — FENTANYL CITRATE PF 50 MCG/ML IJ SOSY: 25.0000 ug | PREFILLED_SYRINGE | INTRAMUSCULAR | Status: DC | PRN

## 2023-09-14 MED ORDER — 0.9 % SODIUM CHLORIDE (POUR BTL) OPTIME
TOPICAL | Status: DC | PRN
  Administered 2023-09-14: 1000 mL

## 2023-09-14 MED ORDER — ONDANSETRON HCL 4 MG/2ML IJ SOLN
INTRAMUSCULAR | Status: AC
Start: 2023-09-14 — End: ?
  Filled 2023-09-14: qty 2

## 2023-09-14 MED ORDER — SODIUM CHLORIDE (PF) 0.9 % IJ SOLN
INTRAMUSCULAR | Status: AC
  Filled 2023-09-14: qty 50

## 2023-09-14 MED ORDER — TIZANIDINE HCL 2 MG PO TABS
2.0000 mg | ORAL_TABLET | Freq: Three times a day (TID) | ORAL | 0 refills | Status: DC | PRN
Start: 2023-09-14 — End: 2023-10-27
  Filled 2023-09-14: qty 30, 10d supply, fill #0

## 2023-09-14 MED ORDER — LIDOCAINE HCL (PF) 2 % IJ SOLN
INTRAMUSCULAR | Status: AC
  Filled 2023-09-14: qty 5

## 2023-09-14 MED ORDER — ASPIRIN 81 MG PO TBEC
81.0000 mg | DELAYED_RELEASE_TABLET | Freq: Two times a day (BID) | ORAL | 0 refills | Status: DC
  Filled 2023-09-14: qty 60, 30d supply, fill #0

## 2023-09-14 MED ORDER — DEXAMETHASONE SODIUM PHOSPHATE 10 MG/ML IJ SOLN
INTRAMUSCULAR | Status: DC | PRN
  Administered 2023-09-14: 10 mg via INTRAVENOUS

## 2023-09-14 MED ORDER — EPHEDRINE SULFATE (PRESSORS) 50 MG/ML IJ SOLN
INTRAMUSCULAR | Status: DC | PRN
  Administered 2023-09-14: 5 mg via INTRAVENOUS
  Administered 2023-09-14: 7.5 mg via INTRAVENOUS
  Administered 2023-09-14: 5 mg via INTRAVENOUS

## 2023-09-14 MED ORDER — FENTANYL CITRATE PF 50 MCG/ML IJ SOSY
PREFILLED_SYRINGE | INTRAMUSCULAR | Status: AC
  Filled 2023-09-14: qty 2

## 2023-09-14 MED ORDER — ACETAMINOPHEN 10 MG/ML IV SOLN: 1000.0000 mg | Freq: Once | INTRAVENOUS | Status: DC | PRN

## 2023-09-14 MED ORDER — CEFAZOLIN SODIUM-DEXTROSE 2-4 GM/100ML-% IV SOLN
2.0000 g | Freq: Four times a day (QID) | INTRAVENOUS | Status: AC
  Administered 2023-09-14 (×2): 2 g via INTRAVENOUS
  Filled 2023-09-14 (×2): qty 100

## 2023-09-14 MED ORDER — MAGNESIUM CITRATE PO SOLN: 1.0000 | Freq: Once | ORAL | Status: DC | PRN

## 2023-09-14 MED ORDER — HYDROCODONE-ACETAMINOPHEN 5-325 MG PO TABS
1.0000 | ORAL_TABLET | Freq: Four times a day (QID) | ORAL | 0 refills | Status: DC | PRN
  Filled 2023-09-14: qty 30, 4d supply, fill #0

## 2023-09-14 MED ORDER — ORAL CARE MOUTH RINSE: 15.0000 mL | Freq: Once | OROMUCOSAL | Status: AC

## 2023-09-14 MED ORDER — HYDROCODONE-ACETAMINOPHEN 5-325 MG PO TABS
1.0000 | ORAL_TABLET | ORAL | Status: DC | PRN
  Administered 2023-09-14 – 2023-09-15 (×2): 2 via ORAL
  Filled 2023-09-14 (×3): qty 2

## 2023-09-14 MED ORDER — SODIUM CHLORIDE 0.9% FLUSH
INTRAVENOUS | Status: DC | PRN
  Administered 2023-09-14: 50 mL

## 2023-09-14 MED ORDER — ALUM & MAG HYDROXIDE-SIMETH 200-200-20 MG/5ML PO SUSP: 30.0000 mL | ORAL | Status: DC | PRN

## 2023-09-14 MED ORDER — DEXAMETHASONE SODIUM PHOSPHATE 10 MG/ML IJ SOLN
INTRAMUSCULAR | Status: AC
Start: 2023-09-14 — End: ?
  Filled 2023-09-14: qty 1

## 2023-09-14 MED ORDER — PANTOPRAZOLE SODIUM 40 MG PO TBEC
40.0000 mg | DELAYED_RELEASE_TABLET | Freq: Every day | ORAL | Status: DC
  Administered 2023-09-14 – 2023-09-15 (×2): 40 mg via ORAL
  Filled 2023-09-14 (×2): qty 1

## 2023-09-14 MED ORDER — OXYCODONE HCL 5 MG PO TABS: 5.0000 mg | ORAL_TABLET | Freq: Once | ORAL | Status: DC | PRN

## 2023-09-14 MED ORDER — TRANEXAMIC ACID-NACL 1000-0.7 MG/100ML-% IV SOLN
1000.0000 mg | INTRAVENOUS | Status: AC
Start: 2023-09-14 — End: 2023-09-14
  Administered 2023-09-14: 1000 mg via INTRAVENOUS
  Filled 2023-09-14: qty 100

## 2023-09-14 SURGICAL SUPPLY — 69 items
ANCH SUT 2.6 FBRSTK 1.7 (Anchor) ×1 IMPLANT
ANCHOR SUT FBRTK 2.6X1.7X2 (Anchor) IMPLANT
APL SKNCLS STERI-STRIP NONHPOA (GAUZE/BANDAGES/DRESSINGS) ×1
ATTUNE MED DOME PAT 38 KNEE (Knees) IMPLANT
BAG COUNTER SPONGE SURGICOUNT (BAG) IMPLANT
BAG SPEC THK2 15X12 ZIP CLS (MISCELLANEOUS)
BAG SPNG CNTER NS LX DISP (BAG)
BAG ZIPLOCK 12X15 (MISCELLANEOUS) ×1 IMPLANT
BASE TIB KNEE REV RP ATUNE SZ6 (Knees) IMPLANT
BENZOIN TINCTURE PRP APPL 2/3 (GAUZE/BANDAGES/DRESSINGS) ×1 IMPLANT
BLADE SAGITTAL 25.0X1.19X90 (BLADE) ×1 IMPLANT
BLADE SAW SGTL 13.0X1.19X90.0M (BLADE) ×1 IMPLANT
BLADE SURG SZ10 CARB STEEL (BLADE) ×2 IMPLANT
BNDG CMPR 6 X 5 YARDS HK CLSR (GAUZE/BANDAGES/DRESSINGS) ×1
BNDG ELASTIC 6INX 5YD STR LF (GAUZE/BANDAGES/DRESSINGS) ×1 IMPLANT
BOOTIES KNEE HIGH SLOAN (MISCELLANEOUS) ×1 IMPLANT
BOWL SMART MIX CTS (DISPOSABLE) ×1 IMPLANT
BSPLAT TIB 6 CMNT REV ROT PLAT (Knees) ×1 IMPLANT
CEMENT HV SMART SET (Cement) ×2 IMPLANT
CEMENT RESTRICTOR DEPUY SZ 6 (Cement) IMPLANT
COMP FEM ATTUNE CRS SZ5 LT (Femur) ×1 IMPLANT
COMPONENT FEM ATN CRS SZ5 LT (Femur) IMPLANT
COVER SURGICAL LIGHT HANDLE (MISCELLANEOUS) ×1 IMPLANT
CUFF TOURN SGL QUICK 34 (TOURNIQUET CUFF) ×1
CUFF TRNQT CYL 34X4.125X (TOURNIQUET CUFF) ×1 IMPLANT
DRAPE INCISE IOBAN 66X45 STRL (DRAPES) ×1 IMPLANT
DRAPE U-SHAPE 47X51 STRL (DRAPES) ×1 IMPLANT
DRSG AQUACEL AG ADV 3.5X10 (GAUZE/BANDAGES/DRESSINGS) ×1 IMPLANT
DURAPREP 26ML APPLICATOR (WOUND CARE) ×1 IMPLANT
ELECT REM PT RETURN 15FT ADLT (MISCELLANEOUS) ×1 IMPLANT
GLOVE BIOGEL PI IND STRL 8 (GLOVE) ×2 IMPLANT
GLOVE ECLIPSE 7.5 STRL STRAW (GLOVE) ×2 IMPLANT
GOWN STRL REUS W/ TWL XL LVL3 (GOWN DISPOSABLE) ×2 IMPLANT
GOWN STRL REUS W/TWL XL LVL3 (GOWN DISPOSABLE) ×4
HANDPIECE INTERPULSE COAX TIP (DISPOSABLE) ×1
HOLDER FOLEY CATH W/STRAP (MISCELLANEOUS) IMPLANT
HOOD PEEL AWAY T7 (MISCELLANEOUS) ×3 IMPLANT
IMMOBILIZER KNEE 20 (SOFTGOODS) ×1 IMPLANT
IMMOBILIZER KNEE 20 THIGH 36 (SOFTGOODS) IMPLANT
INSERT TIB CRS ATTUNE SZ5 8 (Insert) IMPLANT
KIT TURNOVER KIT A (KITS) IMPLANT
MANIFOLD NEPTUNE II (INSTRUMENTS) ×1 IMPLANT
NDL HYPO 22X1.5 SAFETY MO (MISCELLANEOUS) ×2 IMPLANT
NDL MA TROC 1/2 (NEEDLE) IMPLANT
NEEDLE HYPO 22X1.5 SAFETY MO (MISCELLANEOUS) ×2 IMPLANT
NEEDLE MA TROC 1/2 (NEEDLE) ×1 IMPLANT
NS IRRIG 1000ML POUR BTL (IV SOLUTION) ×1 IMPLANT
PACK TOTAL KNEE CUSTOM (KITS) ×1 IMPLANT
PADDING CAST COTTON 6X4 STRL (CAST SUPPLIES) ×1 IMPLANT
PIN STEINMAN FIXATION KNEE (PIN) IMPLANT
PROTECTOR NERVE ULNAR (MISCELLANEOUS) ×1 IMPLANT
RESTRICTOR CEMENT SZ 5 C-STEM (Cement) IMPLANT
REV ATTUNE STEM 14X30 KNEE (Orthopedic Implant) ×1 IMPLANT
SET HNDPC FAN SPRY TIP SCT (DISPOSABLE) ×1 IMPLANT
SPIKE FLUID TRANSFER (MISCELLANEOUS) ×2 IMPLANT
STEM CEMT ATTUNE 14X80 (Knees) IMPLANT
STEM REV ATTUNE 14X30 KNEE (Orthopedic Implant) IMPLANT
STRIP CLOSURE SKIN 1/2X4 (GAUZE/BANDAGES/DRESSINGS) IMPLANT
SUT MNCRL AB 3-0 PS2 18 (SUTURE) ×1 IMPLANT
SUT VIC AB 0 CT1 36 (SUTURE) ×1 IMPLANT
SUT VIC AB 1 CT1 36 (SUTURE) ×2 IMPLANT
SUT VIC AB 2-0 CT1 27 (SUTURE) ×1
SUT VIC AB 2-0 CT1 TAPERPNT 27 (SUTURE) ×1 IMPLANT
SYR CONTROL 10ML LL (SYRINGE) ×2 IMPLANT
TOWER CARTRIDGE SMART MIX (DISPOSABLE) IMPLANT
TRAY CATH INTERMITTENT SS 16FR (CATHETERS) IMPLANT
TUBE SUCTION HIGH CAP CLEAR NV (SUCTIONS) ×1 IMPLANT
WATER STERILE IRR 1000ML POUR (IV SOLUTION) ×2 IMPLANT
WRAP KNEE MAXI GEL POST OP (GAUZE/BANDAGES/DRESSINGS) ×1 IMPLANT

## 2023-09-14 NOTE — H&P (Signed)
TOTAL KNEE ADMISSION H&P  Patient is being admitted for left total knee arthroplasty.  Subjective:  Chief Complaint:left knee pain.  HPI: [name], 77 y.o. female, has a history of pain and functional disability in the left knee due to arthritis and has failed non-surgical conservative treatments for greater than 12 weeks to includeNSAID's and/or analgesics, corticosteriod injections, viscosupplementation injections, flexibility and strengthening excercises, and activity modification.  Onset of symptoms was gradual, starting 4 years ago with gradually worsening course since that time. The patient noted no past surgery on the left knee(s).  Patient currently rates pain in the left knee(s) at 9 out of 10 with activity. Patient has night pain, worsening of pain with activity and weight bearing, pain that interferes with activities of daily living, pain with passive range of motion, and joint swelling.  Patient has evidence of subchondral sclerosis, periarticular osteophytes, and joint space narrowing by imaging studies. This patient has had  failure of all reasonable conservative care . There is no active infection.  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   Aortic atherosclerosis (HCC) by CXR in 2021 11/22/2019   Neuropathy 08/15/2018   Insomnia 02/06/2018   Lumbar polyradiculopathy 01/18/2018   Overweight (BMI 25.0-29.9) 09/17/2015   Hyperparathyroidism (HCC) 07/25/2015   S/P shoulder replacement, right 03/07/2014    Hypothyroidism 02/11/2014   Osteoporosis 02/11/2014   Vitamin D deficiency 01/22/2014   Abnormal glucose 01/22/2014   Medication management 01/22/2014   History of colonic polyps 04/18/2013   Gastroesophageal reflux disease without esophagitis 02/28/2013   Hyperlipidemia, mixed 10/07/2011   Essential hypertension 10/21/2008   Rheumatoid arthritis (HCC) 10/21/2008   Past Medical History:  Diagnosis Date   Anemia    Hx of    Arthritis    Calculus of bile duct without mention of cholecystitis or obstruction    Cataract    GERD (gastroesophageal reflux disease)    H. pylori infection    Hx of    Heart murmur    Hyperlipidemia    Hypertension    Hypothyroid    PUD (peptic ulcer disease)    Rheumatoid arthritis(714.0)     Past Surgical History:  Procedure Laterality Date   AV FISTULA REPAIR     BLADDER SUSPENSION     BREAST CYST EXCISION Left    BUNIONECTOMY     CARPAL TUNNEL RELEASE     CHOLECYSTECTOMY  09/07/2011   CYSTECTOMY     left breast   ERCP  09/29/2011   Procedure: ENDOSCOPIC RETROGRADE CHOLANGIOPANCREATOGRAPHY (ERCP);  Surgeon: Louis Meckel, MD;  Location: [facility];  Service: Endoscopy;  Laterality: N/A;   INTRAMEDULLARY (IM) NAIL INTERTROCHANTERIC Left 07/15/2023   Procedure: INTRAMEDULLARY (IM) NAIL INTERTROCHANTERIC;  Surgeon: Luci Bank, MD;  Location: [facility];  Service: Orthopedics;  Laterality: Left;   PILONIDAL CYST EXCISION     TENOLYSIS  10/26/2011   Procedure: TENDON SHEATH RELEASE/TENOLYSIS;  Surgeon: Nicki Reaper, MD;  Location: [facility];  Service: Orthopedics;  Laterality: Right;  tenosynovectomy removal superficialis slip right index finger   TONSILLECTOMY  TOTAL SHOULDER REPLACEMENT Right 02/22/2014   Dr. Malka So at [facility] RELEASE     VAGINAL HYSTERECTOMY     ovaries not removed    No current facility-administered medications for this encounter.   Current Outpatient Medications  Medication Sig  Dispense Refill Last Dose   aspirin 81 MG tablet Take 1 tablet (81 mg total) by mouth 2 (two) times daily after a meal. For 2 weeks then once a day for 2 weeks for dvt prevention. (Patient taking differently: Take 81 mg by mouth daily.) 45 tablet 0    folic acid (FOLVITE) 1 MG tablet Take 1-3 mg by mouth daily.      gabapentin (NEURONTIN) 600 MG tablet TAKE 1/2 TO 1 TABLET 2 TO 3 TIMES DAILY AS NEEDED FOR PAIN 270 tablet 3    hydroxychloroquine (PLAQUENIL) 200 MG tablet Take 200 mg by mouth 2 (two) times daily.      ibuprofen (IBU) 800 MG tablet TAKE 1 TABLET 2-3 TIMES DAILY WITH FOOD FOR PAIN AND INFLAMMATION (Patient taking differently: Take 800 mg by mouth at bedtime.) 270 tablet 3    levothyroxine (SYNTHROID) 88 MCG tablet Take 88 mcg by mouth daily before breakfast.      methotrexate 50 MG/2ML injection Inject 25 mg into the skin every Thursday.      omeprazole (PRILOSEC) 40 MG capsule TAKE 1 CAPSULE TWICE DAILY TO PREVENT HEARTBURN AND INDIGESTION (Patient taking differently: Take 40 mg by mouth 2 (two) times daily as needed (acid reflux).) 180 capsule 3    traZODone (DESYREL) 150 MG tablet TAKE 1 TABLET 1 HOUR BEFORE BEDTIME AS NEEDED FOR SLEEP 90 tablet 3    Ascorbic Acid (VITAMIN C) 1000 MG tablet Take 1,000 mg by mouth at bedtime.      Cholecalciferol (VITAMIN D3) 125 MCG (5000 UT) CAPS Take 5,000 Units by mouth 3 (three) times a week.      HYDROcodone-acetaminophen (NORCO/VICODIN) 5-325 MG tablet Take 1-2 tablets by mouth every 6 (six) hours as needed for severe pain (post op pain). (Patient not taking: Reported on 09/09/2023) 40 tablet 0 Not Taking   magnesium gluconate (MAGONATE) 500 MG tablet Take 500 mg by mouth See admin instructions. Take 500 mg by mouth at bedtime on Mon/Wed/Fri      zinc gluconate 50 MG tablet Take 50 mg by mouth daily. 3 times a week      Allergies  Allergen Reactions   Tape Other (See Comments)    Paper tape ONLY, please!! Not allergic, sensitive   Ace  Inhibitors Cough   Ambien [Zolpidem] Other (See Comments)    "Odd feeling"   Crestor [Rosuvastatin] Other (See Comments)    Sore muscles   Leflunomide Other (See Comments)    Elevated LFTs   Pravastatin Other (See Comments)    Sore muscles   Prozac [Fluoxetine Hcl] Other (See Comments)    Decreased libido   Zoloft [Sertraline Hcl] Other (See Comments)    Reaction not recalled    Social History   Tobacco Use   Smoking status: Former    Current packs/day: 0.00    Types: Cigarettes    Quit date: 10/25/1995    Years since quitting: 27.9   Smokeless tobacco: Never   Tobacco comments:    Johnnye Lana t1985  Substance Use Topics   Alcohol use: Not Currently    Comment: occasional    Family History  Problem Relation Age of Onset   Lung cancer Mother    Stomach  cancer Father    Heart attack Father        MI at age 69   Colon cancer Maternal Grandfather    Heart attack Brother        MI at age 40   Prostate cancer Paternal Grandfather    Breast cancer Paternal Aunt      Review of Systems ROS: I have reviewed the patient's review of systems thoroughly and there are no positive responses as relates to the HPI.  Objective:  Physical Exam  Vital signs in last 24 hours:   Well-developed well-nourished patient in no acute distress. Alert and oriented x3 HEENT:within normal limits Cardiac: Regular rate and rhythm Pulmonary: Lungs clear to auscultation Abdomen: Soft and nontender.  Normal active bowel sounds  Musculoskeletal: (l knee: painful rom limited rom no instability)  Labs: Recent Results (from the past 2160 hour(s))  CBC with Differential/Platelet     Status: Abnormal   Collection Time: 07/06/23 11:34 AM  Result Value Ref Range   WBC 5.9 3.8 - 10.8 Thousand/uL   RBC 3.96 3.80 - 5.10 Million/uL   Hemoglobin 12.1 11.7 - 15.5 g/dL   HCT 84.1 32.4 - 40.1 %   MCV 93.9 80.0 - 100.0 fL   MCH 30.6 27.0 - 33.0 pg   MCHC 32.5 32.0 - 36.0 g/dL   RDW 02.7 25.3 - 66.4 %    Platelets 155 140 - 400 Thousand/uL   MPV 13.0 (H) 7.5 - 12.5 fL   Neutro Abs 3,552 1,500 - 7,800 cells/uL   Lymphs Abs 1,269 850 - 3,900 cells/uL   Absolute Monocytes 649 200 - 950 cells/uL   Eosinophils Absolute 360 15 - 500 cells/uL   Basophils Absolute 71 0 - 200 cells/uL   Neutrophils Relative % 60.2 %   Total Lymphocyte 21.5 %   Monocytes Relative 11.0 %   Eosinophils Relative 6.1 %   Basophils Relative 1.2 %  COMPLETE METABOLIC PANEL WITH GFR     Status: Abnormal   Collection Time: 07/06/23 11:34 AM  Result Value Ref Range   Glucose, Bld 79 65 - 99 mg/dL    Comment: .            Fasting reference interval .    BUN 4 (L) 7 - 25 mg/dL   Creat 4.03 4.74 - 2.59 mg/dL   eGFR 87 > OR = 60 DG/LOV/5.64P3   BUN/Creatinine Ratio 6 6 - 22 (calc)   Sodium 142 135 - 146 mmol/L   Potassium 4.2 3.5 - 5.3 mmol/L   Chloride 105 98 - 110 mmol/L   CO2 30 20 - 32 mmol/L   Calcium 10.4 8.6 - 10.4 mg/dL   Total Protein 5.5 (L) 6.1 - 8.1 g/dL   Albumin 3.8 3.6 - 5.1 g/dL   Globulin 1.7 (L) 1.9 - 3.7 g/dL (calc)   AG Ratio 2.2 1.0 - 2.5 (calc)   Total Bilirubin 0.5 0.2 - 1.2 mg/dL   Alkaline phosphatase (APISO) 65 37 - 153 U/L   AST 24 10 - 35 U/L   ALT 13 6 - 29 U/L  Lipid panel     Status: None   Collection Time: 07/06/23 11:34 AM  Result Value Ref Range   Cholesterol 169 <200 mg/dL   HDL 76 > OR = 50 mg/dL   Triglycerides 55 <295 mg/dL   LDL Cholesterol (Calc) 79 mg/dL (calc)    Comment: Reference range: <100 . Desirable range <100 mg/dL for primary prevention;   <70 mg/dL  for patients with CHD or diabetic patients  with > or = 2 CHD risk factors. Marland Kitchen LDL-C is now calculated using the Martin-Hopkins  calculation, which is a validated novel method providing  better accuracy than the Friedewald equation in the  estimation of LDL-C.  Horald Pollen et al. Lenox Ahr. 1610;960(45): 2061-2068  (http://education.[facility].com/faq/FAQ164)    Total CHOL/HDL Ratio 2.2 <5.0 (calc)    Non-HDL Cholesterol (Calc) 93 <409 mg/dL (calc)    Comment: For patients with diabetes plus 1 major ASCVD risk  factor, treating to a non-HDL-C goal of <100 mg/dL  (LDL-C of <81 mg/dL) is considered a therapeutic  option.   TSH     Status: None   Collection Time: 07/06/23 11:34 AM  Result Value Ref Range   TSH 1.54 0.40 - 4.50 mIU/L  Urinalysis, Routine w reflex microscopic     Status: None   Collection Time: 07/06/23 11:34 AM  Result Value Ref Range   Color, Urine YELLOW YELLOW   APPearance CLEAR CLEAR   Specific Gravity, Urine 1.006 1.001 - 1.035   pH 6.0 5.0 - 8.0   Glucose, UA NEGATIVE NEGATIVE   Bilirubin Urine NEGATIVE NEGATIVE   Ketones, ur NEGATIVE NEGATIVE   Hgb urine dipstick NEGATIVE NEGATIVE   Protein, ur NEGATIVE NEGATIVE   Nitrite NEGATIVE NEGATIVE   Leukocytes,Ua NEGATIVE NEGATIVE  Protime-INR     Status: None   Collection Time: 07/06/23 11:34 AM  Result Value Ref Range   INR 1.0     Comment: Reference Range                     0.9-1.1 Moderate-intensity Warfarin Therapy 2.0-3.0 Higher-intensity Warfarin Therapy   3.0-4.0  .    Prothrombin Time 11.0 9.0 - 11.5 sec    Comment: For additional information, please refer to http://education.[facility].com/faq/FAQ104 (This link is being provided for informational/ educational purposes only.)   Basic metabolic panel     Status: Abnormal   Collection Time: 07/15/23 12:55 PM  Result Value Ref Range   Sodium 140 135 - 145 mmol/L   Potassium 3.6 3.5 - 5.1 mmol/L   Chloride 105 98 - 111 mmol/L   CO2 27 22 - 32 mmol/L   Glucose, Bld 69 (L) 70 - 99 mg/dL    Comment: Glucose reference range applies only to samples taken after fasting for at least 8 hours.   BUN 7 (L) 8 - 23 mg/dL   Creatinine, Ser 1.91 0.44 - 1.00 mg/dL   Calcium 9.6 8.9 - 47.8 mg/dL   GFR, Estimated >29 >56 mL/min    Comment: (NOTE) Calculated using the CKD-EPI Creatinine Equation (2021)    Anion gap 8 5 - 15    Comment: Performed at  [facility], [address] [place], [place] [address]  CBC with Differential     Status: Abnormal   Collection Time: 07/15/23 12:55 PM  Result Value Ref Range   WBC 10.4 4.0 - 10.5 K/uL   RBC 3.93 3.87 - 5.11 MIL/uL   Hemoglobin 12.3 12.0 - 15.0 g/dL   HCT 65.7 84.6 - 96.2 %   MCV 95.9 80.0 - 100.0 fL   MCH 31.3 26.0 - 34.0 pg   MCHC 32.6 30.0 - 36.0 g/dL   RDW 95.2 84.1 - 32.4 %   Platelets 111 (L) 150 - 400 K/uL   nRBC 0.0 0.0 - 0.2 %   Neutrophils Relative % 79 %   Neutro Abs 8.3 (H) 1.7 - 7.7  K/uL   Lymphocytes Relative 7 %   Lymphs Abs 0.7 0.7 - 4.0 K/uL   Monocytes Relative 6 %   Monocytes Absolute 0.6 0.1 - 1.0 K/uL   Eosinophils Relative 7 %   Eosinophils Absolute 0.8 (H) 0.0 - 0.5 K/uL   Basophils Relative 1 %   Basophils Absolute 0.1 0.0 - 0.1 K/uL   Immature Granulocytes 0 %   Abs Immature Granulocytes 0.04 0.00 - 0.07 K/uL    Comment: Performed at [facility], [address], [place], [place] [address]  Protime-INR     Status: None   Collection Time: 07/15/23 12:55 PM  Result Value Ref Range   Prothrombin Time 14.9 11.4 - 15.2 seconds   INR 1.1 0.8 - 1.2    Comment: (NOTE) INR goal varies based on device and disease states. Performed at [facility], [address] [place], [place] [address]   Type and screen [facility]     Status: None   Collection Time: 07/15/23 12:55 PM  Result Value Ref Range   ABO/RH(D) AB POS    Antibody Screen NEG    Sample Expiration      07/18/2023,2359 Performed at [facility], [address], [place], [place] [address]   CBG monitoring, ED     Status: Abnormal   Collection Time: 07/15/23  3:41 PM  Result Value Ref Range   Glucose-Capillary 133 (H) 70 - 99 mg/dL    Comment: Glucose reference range applies only to samples taken after fasting for at least 8 hours.  ABO/Rh     Status: None   Collection Time: 07/15/23  5:00 PM   Result Value Ref Range   ABO/RH(D)      AB POS Performed at [facility], [address], [place], [place] [address]   Surgical PCR screen     Status: Abnormal   Collection Time: 07/15/23  5:22 PM   Specimen: Nasal Mucosa; Nasal Swab  Result Value Ref Range   MRSA, PCR NEGATIVE NEGATIVE   Staphylococcus aureus POSITIVE (A) NEGATIVE    Comment: (NOTE) The Xpert SA Assay (FDA approved for NASAL specimens in patients 67 years of age and older), is one component of a comprehensive surveillance program. It is not intended to diagnose infection nor to guide or monitor treatment. Performed at [facility], [address] [place], [place] [address]   Glucose, capillary     Status: None   Collection Time: 07/15/23  5:23 PM  Result Value Ref Range   Glucose-Capillary 73 70 - 99 mg/dL    Comment: Glucose reference range applies only to samples taken after fasting for at least 8 hours.   Comment 1 Call MD NNP PA CNM   Glucose, capillary     Status: Abnormal   Collection Time: 07/15/23  6:39 PM  Result Value Ref Range   Glucose-Capillary 116 (H) 70 - 99 mg/dL    Comment: Glucose reference range applies only to samples taken after fasting for at least 8 hours.  Glucose, capillary     Status: Abnormal   Collection Time: 07/15/23  7:42 PM  Result Value Ref Range   Glucose-Capillary 104 (H) 70 - 99 mg/dL    Comment: Glucose reference range applies only to samples taken after fasting for at least 8 hours.  Glucose, capillary     Status: Abnormal   Collection Time: 07/15/23 10:07 PM  Result Value Ref Range  Glucose-Capillary 131 (H) 70 - 99 mg/dL    Comment: Glucose reference range applies only to samples taken after fasting for at least 8 hours.  Glucose, capillary     Status: Abnormal   Collection Time: 07/16/23 12:16 AM  Result Value Ref Range   Glucose-Capillary 168 (H) 70 - 99 mg/dL    Comment: Glucose reference range applies only to samples  taken after fasting for at least 8 hours.  CBC     Status: Abnormal   Collection Time: 07/16/23  3:55 AM  Result Value Ref Range   WBC 8.4 4.0 - 10.5 K/uL   RBC 3.20 (L) 3.87 - 5.11 MIL/uL   Hemoglobin 9.8 (L) 12.0 - 15.0 g/dL   HCT 16.1 (L) 09.6 - 04.5 %   MCV 96.6 80.0 - 100.0 fL   MCH 30.6 26.0 - 34.0 pg   MCHC 31.7 30.0 - 36.0 g/dL   RDW 40.9 81.1 - 91.4 %   Platelets 88 (L) 150 - 400 K/uL    Comment: SPECIMEN CHECKED FOR CLOTS Immature Platelet Fraction may be clinically indicated, consider ordering this additional test [id number] CONSISTENT WITH PREVIOUS RESULT REPEATED TO VERIFY    nRBC 0.0 0.0 - 0.2 %    Comment: Performed at [facility], [address] [place], [place] [address]  Comprehensive metabolic panel     Status: Abnormal   Collection Time: 07/16/23  3:55 AM  Result Value Ref Range   Sodium 133 (L) 135 - 145 mmol/L    Comment: DELTA CHECK NOTED   Potassium 4.1 3.5 - 5.1 mmol/L   Chloride 101 98 - 111 mmol/L   CO2 25 22 - 32 mmol/L   Glucose, Bld 204 (H) 70 - 99 mg/dL    Comment: Glucose reference range applies only to samples taken after fasting for at least 8 hours.   BUN 8 8 - 23 mg/dL   Creatinine, Ser 7.84 0.44 - 1.00 mg/dL   Calcium 8.9 8.9 - 69.6 mg/dL   Total Protein 4.9 (L) 6.5 - 8.1 g/dL   Albumin 2.8 (L) 3.5 - 5.0 g/dL   AST 22 15 - 41 U/L   ALT 17 0 - 44 U/L   Alkaline Phosphatase 53 38 - 126 U/L   Total Bilirubin 0.9 0.3 - 1.2 mg/dL   GFR, Estimated >29 >52 mL/min    Comment: (NOTE) Calculated using the CKD-EPI Creatinine Equation (2021)    Anion gap 7 5 - 15    Comment: Performed at [facility], [address], [place], [place] [address]  Glucose, capillary     Status: Abnormal   Collection Time: 07/16/23  4:16 AM  Result Value Ref Range   Glucose-Capillary 185 (H) 70 - 99 mg/dL    Comment: Glucose reference range applies only to samples taken after fasting for at least 8 hours.  Glucose,  capillary     Status: Abnormal   Collection Time: 07/16/23  8:57 AM  Result Value Ref Range   Glucose-Capillary 172 (H) 70 - 99 mg/dL    Comment: Glucose reference range applies only to samples taken after fasting for at least 8 hours.  Glucose, capillary     Status: Abnormal   Collection Time: 07/16/23 11:37 AM  Result Value Ref Range   Glucose-Capillary 191 (H) 70 - 99 mg/dL    Comment: Glucose reference range applies only to samples taken after fasting for at least 8 hours.  Glucose, capillary     Status:  Abnormal   Collection Time: 07/16/23  5:10 PM  Result Value Ref Range   Glucose-Capillary 136 (H) 70 - 99 mg/dL    Comment: Glucose reference range applies only to samples taken after fasting for at least 8 hours.  Glucose, capillary     Status: Abnormal   Collection Time: 07/16/23  8:56 PM  Result Value Ref Range   Glucose-Capillary 128 (H) 70 - 99 mg/dL    Comment: Glucose reference range applies only to samples taken after fasting for at least 8 hours.  Glucose, capillary     Status: Abnormal   Collection Time: 07/17/23  7:37 AM  Result Value Ref Range   Glucose-Capillary 104 (H) 70 - 99 mg/dL    Comment: Glucose reference range applies only to samples taken after fasting for at least 8 hours.  CBC with Differential/Platelet     Status: Abnormal   Collection Time: 07/17/23 11:43 AM  Result Value Ref Range   WBC 16.3 (H) 4.0 - 10.5 K/uL   RBC 3.54 (L) 3.87 - 5.11 MIL/uL   Hemoglobin 10.9 (L) 12.0 - 15.0 g/dL   HCT 81.1 (L) 91.4 - 78.2 %   MCV 94.1 80.0 - 100.0 fL   MCH 30.8 26.0 - 34.0 pg   MCHC 32.7 30.0 - 36.0 g/dL   RDW 95.6 21.3 - 08.6 %   Platelets 165 150 - 400 K/uL   nRBC 0.0 0.0 - 0.2 %   Neutrophils Relative % 82 %   Neutro Abs 13.2 (H) 1.7 - 7.7 K/uL   Lymphocytes Relative 7 %   Lymphs Abs 1.2 0.7 - 4.0 K/uL   Monocytes Relative 8 %   Monocytes Absolute 1.3 (H) 0.1 - 1.0 K/uL   Eosinophils Relative 3 %   Eosinophils Absolute 0.5 0.0 - 0.5 K/uL   Basophils  Relative 0 %   Basophils Absolute 0.0 0.0 - 0.1 K/uL   Immature Granulocytes 0 %   Abs Immature Granulocytes 0.06 0.00 - 0.07 K/uL    Comment: Performed at [facility], [address] [place], [place] [address]  Comprehensive metabolic panel     Status: Abnormal   Collection Time: 07/17/23 11:43 AM  Result Value Ref Range   Sodium 140 135 - 145 mmol/L    Comment: DELTA CHECK NOTED   Potassium 4.0 3.5 - 5.1 mmol/L   Chloride 103 98 - 111 mmol/L   CO2 26 22 - 32 mmol/L   Glucose, Bld 98 70 - 99 mg/dL    Comment: Glucose reference range applies only to samples taken after fasting for at least 8 hours.   BUN 9 8 - 23 mg/dL   Creatinine, Ser 9.62 0.44 - 1.00 mg/dL   Calcium 95.2 8.9 - 84.1 mg/dL   Total Protein 6.1 (L) 6.5 - 8.1 g/dL   Albumin 3.7 3.5 - 5.0 g/dL   AST 43 (H) 15 - 41 U/L   ALT 20 0 - 44 U/L   Alkaline Phosphatase 63 38 - 126 U/L   Total Bilirubin 1.0 0.3 - 1.2 mg/dL   GFR, Estimated >32 >44 mL/min    Comment: (NOTE) Calculated using the CKD-EPI Creatinine Equation (2021)    Anion gap 11 5 - 15    Comment: Performed at [facility], [address] [place], [place] [address]  Phosphorus     Status: Abnormal   Collection Time: 07/17/23 11:43 AM  Result Value Ref Range   Phosphorus 2.0 (L) 2.5 - 4.6  mg/dL    Comment: Performed at [facility], [address], [place], [place] [address]  Magnesium     Status: None   Collection Time: 07/17/23 11:43 AM  Result Value Ref Range   Magnesium 2.1 1.7 - 2.4 mg/dL    Comment: Performed at [facility], [address], [place], [place] [address]  Glucose, capillary     Status: None   Collection Time: 07/17/23 12:41 PM  Result Value Ref Range   Glucose-Capillary 98 70 - 99 mg/dL    Comment: Glucose reference range applies only to samples taken after fasting for at least 8 hours.  Urinalysis, Routine w reflex microscopic     Status: Abnormal    Collection Time: 07/25/23  9:59 AM  Result Value Ref Range   Color, Urine YELLOW YELLOW   APPearance CLEAR CLEAR   Specific Gravity, Urine 1.008 1.001 - 1.035   pH 7.0 5.0 - 8.0   Glucose, UA NEGATIVE NEGATIVE   Bilirubin Urine NEGATIVE NEGATIVE   Ketones, ur NEGATIVE NEGATIVE   Hgb urine dipstick NEGATIVE NEGATIVE   Protein, ur NEGATIVE NEGATIVE   Nitrite NEGATIVE NEGATIVE   Leukocytes,Ua 2+ (A) NEGATIVE   WBC, UA 0-5 0 - 5 /HPF   RBC / HPF 0-2 0 - 2 /HPF   Squamous Epithelial / HPF 0-5 < OR = 5 /HPF   Bacteria, UA NONE SEEN NONE SEEN /HPF   Hyaline Cast NONE SEEN NONE SEEN /LPF  Microalbumin / creatinine urine ratio     Status: None   Collection Time: 07/25/23  9:59 AM  Result Value Ref Range   Creatinine, Urine 38 20 - 275 mg/dL   Microalb, Ur <0.9 mg/dL    Comment: Reference Range Not established    Microalb Creat Ratio NOTE <30 mg/g creat    Comment: NOTE: The urine albumin value is less than  0.2 mg/dL therefore we are unable to calculate  excretion and/or creatinine ratio. . The ADA defines abnormalities in albumin excretion as follows: Marland Kitchen Albuminuria Category        Result (mg/g creatinine) . Normal to Mildly increased   <30 Moderately increased         30-299  Severely increased           > OR = 300 . The ADA recommends that at least two of three specimens collected within a 3-6 month period be abnormal before considering a patient to be within a diagnostic category.   CBC with Differential/Platelet     Status: Abnormal   Collection Time: 07/25/23  9:59 AM  Result Value Ref Range   WBC 5.6 3.8 - 10.8 Thousand/uL   RBC 3.31 (L) 3.80 - 5.10 Million/uL   Hemoglobin 10.2 (L) 11.7 - 15.5 g/dL   HCT 81.1 (L) 91.4 - 78.2 %   MCV 93.4 80.0 - 100.0 fL   MCH 30.8 27.0 - 33.0 pg   MCHC 33.0 32.0 - 36.0 g/dL   RDW 95.6 21.3 - 08.6 %   Platelets 229 140 - 400 Thousand/uL   MPV 12.3 7.5 - 12.5 fL   Neutro Abs 3,718 1,500 - 7,800 cells/uL   Lymphs Abs 924 850 -  3,900 cells/uL   Absolute Monocytes 599 200 - 950 cells/uL   Eosinophils Absolute 308 15 - 500 cells/uL   Basophils Absolute 50 0 - 200 cells/uL   Neutrophils Relative % 66.4 %   Total Lymphocyte 16.5 %   Monocytes Relative 10.7 %  Eosinophils Relative 5.5 %   Basophils Relative 0.9 %  COMPLETE METABOLIC PANEL WITH GFR     Status: Abnormal   Collection Time: 07/25/23  9:59 AM  Result Value Ref Range   Glucose, Bld 95 65 - 99 mg/dL    Comment: .            Fasting reference interval .    BUN 5 (L) 7 - 25 mg/dL   Creat 1.61 0.96 - 0.45 mg/dL   eGFR 75 > OR = 60 WU/JWJ/1.91Y7   BUN/Creatinine Ratio 6 6 - 22 (calc)   Sodium 139 135 - 146 mmol/L   Potassium 4.3 3.5 - 5.3 mmol/L   Chloride 104 98 - 110 mmol/L   CO2 29 20 - 32 mmol/L   Calcium 10.1 8.6 - 10.4 mg/dL   Total Protein 5.3 (L) 6.1 - 8.1 g/dL   Albumin 3.7 3.6 - 5.1 g/dL   Globulin 1.6 (L) 1.9 - 3.7 g/dL (calc)   AG Ratio 2.3 1.0 - 2.5 (calc)   Total Bilirubin 0.8 0.2 - 1.2 mg/dL   Alkaline phosphatase (APISO) 104 37 - 153 U/L   AST 21 10 - 35 U/L   ALT 12 6 - 29 U/L  Magnesium     Status: None   Collection Time: 07/25/23  9:59 AM  Result Value Ref Range   Magnesium 1.8 1.5 - 2.5 mg/dL  Lipid panel     Status: None   Collection Time: 07/25/23  9:59 AM  Result Value Ref Range   Cholesterol 169 <200 mg/dL   HDL 63 > OR = 50 mg/dL   Triglycerides 81 <829 mg/dL   LDL Cholesterol (Calc) 89 mg/dL (calc)    Comment: Reference range: <100 . Desirable range <100 mg/dL for primary prevention;   <70 mg/dL for patients with CHD or diabetic patients  with > or = 2 CHD risk factors. Marland Kitchen LDL-C is now calculated using the Martin-Hopkins  calculation, which is a validated novel method providing  better accuracy than the Friedewald equation in the  estimation of LDL-C.  Horald Pollen et al. Lenox Ahr. 5621;308(65): 2061-2068  (http://education.[facility].com/faq/FAQ164)    Total CHOL/HDL Ratio 2.7 <5.0 (calc)   Non-HDL  Cholesterol (Calc) 106 <130 mg/dL (calc)    Comment: For patients with diabetes plus 1 major ASCVD risk  factor, treating to a non-HDL-C goal of <100 mg/dL  (LDL-C of <78 mg/dL) is considered a therapeutic  option.   TSH     Status: None   Collection Time: 07/25/23  9:59 AM  Result Value Ref Range   TSH 2.99 0.40 - 4.50 mIU/L  Hemoglobin A1c     Status: None   Collection Time: 07/25/23  9:59 AM  Result Value Ref Range   Hgb A1c MFr Bld 5.0 <5.7 % of total Hgb    Comment: For the purpose of screening for the presence of diabetes: . <5.7%       Consistent with the absence of diabetes 5.7-6.4%    Consistent with increased risk for diabetes             (prediabetes) > or =6.5%  Consistent with diabetes . This assay result is consistent with a decreased risk of diabetes. . Currently, no consensus exists regarding use of hemoglobin A1c for diagnosis of diabetes in children. . According to American Diabetes Association (ADA) guidelines, hemoglobin A1c <7.0% represents optimal control in non-pregnant diabetic patients. Different metrics may apply to specific patient populations.  Standards of Medical  Care in Diabetes(ADA). .    Mean Plasma Glucose 97 mg/dL   eAG (mmol/L) 5.4 mmol/L    Comment: . This test was performed on the Roche cobas c503 platform. Effective 08/16/22, a change in test platforms from the Abbott Architect to the Roche cobas c503 may have shifted HbA1c results compared to historical results. Based on laboratory validation testing conducted at [facility], the Roche platform relative to the Abbott platform had an average increase in HbA1c value of < or = 0.3%. This difference is within accepted  variability established by the [facility]. Note that not all individuals will have had a shift in their results and direct comparisons between historical and current results for testing conducted on different platforms is  not recommended.   Insulin, random     Status: None   Collection Time: 07/25/23  9:59 AM  Result Value Ref Range   Insulin 8.0 uIU/mL    Comment:       Reference Range  < or = 18.4 .       Risk:       Optimal          < or = 18.4       Moderate         NA       High             >18.4 .       Adult cardiovascular event risk category       cut points (optimal, moderate, high)       are based on Insulin Reference Interval       studies performed at [facility]       in 2022. Marland Kitchen   VITAMIN D 25 Hydroxy (Vit-D Deficiency, Fractures)     Status: None   Collection Time: 07/25/23  9:59 AM  Result Value Ref Range   Vit D, 25-Hydroxy 78 30 - 100 ng/mL    Comment: Vitamin D Status         25-OH Vitamin D: . Deficiency:                    <20 ng/mL Insufficiency:             20 - 29 ng/mL Optimal:                 > or = 30 ng/mL . For 25-OH Vitamin D testing on patients on  D2-supplementation and patients for whom quantitation  of D2 and D3 fractions is required, the QuestAssureD(TM) 25-OH VIT D, (D2,D3), LC/MS/MS is recommended: order  code 57846 (patients >61yrs). . See Note 1 . Note 1 . For additional information, please refer to  http://education.[facility].com/faq/FAQ199  (This link is being provided for informational/ educational purposes only.)   MICROSCOPIC MESSAGE     Status: None   Collection Time: 07/25/23  9:59 AM  Result Value Ref Range   Note      Comment: This urine was analyzed for the presence of WBC,  RBC, bacteria, casts, and other formed elements.  Only those elements seen were reported. . .   Surgical pcr screen     Status: None   Collection Time: 09/13/23  7:52 AM   Specimen: Nasal Mucosa; Nasal Swab  Result Value Ref Range   MRSA, PCR NEGATIVE NEGATIVE   Staphylococcus aureus NEGATIVE NEGATIVE    Comment: (NOTE) The Xpert SA Assay (FDA approved for NASAL specimens in patients 71 years of age  and older), is one component of a  comprehensive surveillance program. It is not intended to diagnose infection nor to guide or monitor treatment. Performed at [facility], [address], [place], [place] [address]   Basic metabolic panel per protocol     Status: Abnormal   Collection Time: 09/13/23  8:30 AM  Result Value Ref Range   Sodium 139 135 - 145 mmol/L   Potassium 4.6 3.5 - 5.1 mmol/L    Comment: MODERATE HEMOLYSIS   Chloride 108 98 - 111 mmol/L   CO2 23 22 - 32 mmol/L   Glucose, Bld 92 70 - 99 mg/dL    Comment: Glucose reference range applies only to samples taken after fasting for at least 8 hours.   BUN 6 (L) 8 - 23 mg/dL   Creatinine, Ser 9.14 0.44 - 1.00 mg/dL   Calcium 78.2 8.9 - 95.6 mg/dL   GFR, Estimated >21 >30 mL/min    Comment: (NOTE) Calculated using the CKD-EPI Creatinine Equation (2021)    Anion gap 8 5 - 15    Comment: Performed at [facility], [address], [place], [place] [address]  CBC per protocol     Status: Abnormal   Collection Time: 09/13/23  8:30 AM  Result Value Ref Range   WBC 6.0 4.0 - 10.5 K/uL   RBC 4.05 3.87 - 5.11 MIL/uL   Hemoglobin 12.1 12.0 - 15.0 g/dL   HCT 46.9 62.9 - 52.8 %   MCV 94.3 80.0 - 100.0 fL   MCH 29.9 26.0 - 34.0 pg   MCHC 31.7 30.0 - 36.0 g/dL   RDW 41.3 24.4 - 01.0 %   Platelets 147 (L) 150 - 400 K/uL    Comment: SPECIMEN CHECKED FOR CLOTS REPEATED TO VERIFY    nRBC 0.0 0.0 - 0.2 %    Comment: Performed at [facility], [address], [place], [place] [address]     Estimated body mass index is 25.32 kg/m as calculated from the following:   Height as of 09/13/23: 5\' 1"  (1.549 m).   Weight as of 09/13/23: 60.8 kg.   Imaging Review Plain radiographs demonstrate severe degenerative joint disease of the left knee(s). The overall alignment ismild varus. The bone quality appears to be fair for age and reported activity level.      Assessment/Plan:  End stage arthritis, left knee    The patient history, physical examination, clinical judgment of the provider and imaging studies are consistent with end stage degenerative joint disease of the left knee(s) and total knee arthroplasty is deemed medically necessary. The treatment options including medical management, injection therapy arthroscopy and arthroplasty were discussed at length. The risks and benefits of total knee arthroplasty were presented and reviewed. The risks due to aseptic loosening, infection, stiffness, patella tracking problems, thromboembolic complications and other imponderables were discussed. The patient acknowledged the explanation, agreed to proceed with the plan and consent was signed. Patient is being admitted for inpatient treatment for surgery, pain control, PT, OT, prophylactic antibiotics, VTE prophylaxis, progressive ambulation and ADL's and discharge planning. The patient is planning to be discharged home with home health services     Patient's anticipated LOS is less than 2 midnights, meeting these requirements: - Younger than 58 - Lives within 1 hour of care - Has a competent adult at home to recover with post-op recover - NO history of  - Chronic pain requiring opiods  - Diabetes  - Coronary Artery Disease  -  Heart failure  - Heart attack  - Stroke  - DVT/VTE  - Cardiac arrhythmia  - Respiratory Failure/COPD  - Renal failure  - Anemia  - Advanced Liver disease

## 2023-09-14 NOTE — Anesthesia Procedure Notes (Signed)
Anesthesia Regional Block: Adductor canal block   Pre-Anesthetic Checklist: , timeout performed,  Correct Patient, Correct Site, Correct Laterality,  Correct Procedure, Correct Position, site marked,  Risks and benefits discussed,  Surgical consent,  Pre-op evaluation,  At surgeon's request and post-op pain management  Laterality: Left  Prep: chloraprep       Needles:  Injection technique: Single-shot  Needle Type: Echogenic Stimulator Needle     Needle Length: 9cm  Needle Gauge: 21     Additional Needles:   Procedures:,,,, ultrasound used (permanent image in chart),,    Narrative:  Start time: 09/14/2023 10:40 AM End time: 09/14/2023 10:43 AM Injection made incrementally with aspirations every 5 mL.  Performed by: Personally  Anesthesiologist: Patillas Nation, MD  Additional Notes: Discussed risks and benefits of the nerve block in detail, including but not limited vascular injury, permanent nerve damage and infection.   Patient tolerated the procedure well. Local anesthetic introduced in an incremental fashion under minimal resistance after negative aspirations. No paresthesias were elicited. After completion of the procedure, no acute issues were identified and patient continued to be monitored by RN.

## 2023-09-14 NOTE — Discharge Instructions (Signed)

## 2023-09-14 NOTE — Brief Op Note (Signed)
09/14/2023  3:20 PM  PATIENT:  [name]  77 y.o. female  PRE-OPERATIVE DIAGNOSIS:  LEFT KNEE FEMORAL STRESS FRACTURE  POST-OPERATIVE DIAGNOSIS:  LEFT KNEE FEMORAL STRESS FRACTURE  PROCEDURE:  Procedure(s): TOTAL KNEE ARTHROPLASTY (Left)  SURGEON:  Surgeons and Role:    [name], MD - Primary  PHYSICIAN ASSISTANT:   ASSISTANTS: Gus Puma PAc   ANESTHESIA:   spinal  EBL:  30 mL   BLOOD ADMINISTERED:none  DRAINS: none   LOCAL MEDICATIONS USED:  MARCAINE    and OTHER experel  SPECIMEN:  No Specimen  DISPOSITION OF SPECIMEN:  N/A  COUNTS:  YES  TOURNIQUET:   Total Tourniquet Time Documented: Thigh (Left) - 102 minutes Total: Thigh (Left) - 102 minutes   DICTATION: .Other Dictation: Dictation Number [id number]  PLAN OF CARE: Admit for overnight observation  PATIENT DISPOSITION:  PACU - hemodynamically stable.   Delay start of Pharmacological VTE agent (>24hrs) due to surgical blood loss or risk of bleeding: no

## 2023-09-14 NOTE — Op Note (Signed)
NAME: [name], [name] MEDICAL RECORD NO: [id number] ACCOUNT NO: [id number] DATE OF BIRTH: 01/05/1946 FACILITY: [facility]ATION: [id number] PHYSICIAN: Harvie Junior, MD  Operative Report   DATE OF PROCEDURE: 09/14/2023  IDENTIFICATION:  The patient is a 77 year old female, in Orthopedic Surgery Service.  PREOPERATIVE DIAGNOSIS: End-stage degenerative joint disease, left knee.  POSTOPERATIVE DIAGNOSES: 1.  End-stage degenerative joint disease, left knee. 2.  Fracture of medial femoral condyle. 3.  Fracture of lateral tibial plateau. 4.  Patellar tendon disruption.  PROCEDURES PERFORMED: 1.  Left total knee replacement with revision stemmed instruments, a size 5 femur with a 14 x 80 mm stem, a size 6 revision tibial tray with a 14 x 30 mm cemented stem, a size 38 patella, and an 8 mm revision polyethylene liner. 2.  Open treatment of medial femoral condyle fracture. 3.  Open treatment of lateral tibial plateau fracture. 4.  Patellar tendon repair with suture anchor and labral tape and FiberTape.  SURGEON: Harvie Junior, MD  ASSISTANT:  Gus Puma, PA-C, who was present for the entire case and assisted by retraction of tissues, manipulation of the leg, and closing to minimize OR time.  BRIEF HISTORY: The patient is a 77 year old female with a long history of significant complaints of left knee pain.  We treated her interestingly preoperatively for a severe proximal tibial tibial stress fracture, which was severe in nature.  She got through that and  was having worsening pain on the left side and because of end-stage arthritis and worsening pain, she was taken to the operating room for left total knee replacement.  DESCRIPTION OF PROCEDURE:  The patient was taken to the operating room after anesthesia obtained with a spinal anesthetic.  The patient was placed supine on the operating table.  The left leg was then prepped and draped in the usual sterile fashion.   Following this, the  left leg was exsanguinated and a blood pressure tourniquet inflated to 250 mmHg.  Following this, an incision was made.  Subcutaneous dissection down to the level of the extensor mechanism and a medial parapatellar arthrotomy was  undertaken.  The medial and lateral meniscus were removed followed by retropatellar fat pad, followed by anterior and posterior cruciates and the synovial reflection up top was opened to see the anterior aspect of the femur.  Following this, a reamer was  passed down the central portion of the femur.  At that point, we noted that she had really osteoporotic bone and that the drill just easily fell into the femur.  Once that was done, we sized the femur anterior to posterior, sized to a 5.  After the  guide was put in place, we made the distal femoral cut and at that time, you could really tell with even putting in the pins that the bone was very osteoporotic.  We made the distal femoral cut and the medial femoral condyle had really soft bone and at  that point, we were thinking that we may need some kind of revision stem work.  I put the 5 block on, made the anterior and posterior cuts, and really became convinced at that point that her bone quality was so bad that we are going to need some sort of  additional stress transmission ability on the femoral side.  At that point, we did the anterior and posterior cuts.  We did the chamfer cuts and the box cut and went to the tibia.  Again, made a perpendicular cut to the long  axis of the tibia and there  was extremely osteoporotic bone at that point.  Just from flexing up, she had pulled off about 7 mm of the lateral tibial plateau, probably from the pull of the IT band.  We also noticed during this time that the patellar tendon had peeled off of the  patellar tibial tubercle.  At that point, we just felt like her tissue quality was bad, osteoporotic bone, and I felt like we really needed to do some additional fixation on both sides and  that was our plan at that point.  We then went to opening and we  put the FiberVision femur on, made a small additional box cut and then basically built a trial.  I was able to just take the trial and push it up into the femur.  No additional cuts were necessary at that point and we sort of removed that and then went  to the tibia.  We put the revision 6 tray on which was the right size for the tibia and then basically, we were able to hand impact the reamer at that point.  At this point, the trials were put in place, returned to the patella, cut it down to a level of  14 mm and put a 38 patella on and trialed a 6 trial.  It was a little loose in both flexion and extension and so at that point, I felt like we would probably get to an 8 with her, but at any rate, we removed all the parts, opened all the trials, and  built them on the back table.  Once that was done, cement was mixed.  The wound was copiously and thoroughly irrigated.  While we were irrigating the femur, about 50% of the medial femoral condyle distally came off as a single piece and at that point, we  felt that we were going to need to replace that.  We thought briefly about putting augmented portions onto the femur, but I felt like if half the condyle was there, we could impaction graft that and use that portion of the condyle back, so we were able  to kind of maintain that piece and kind of hold it in place.  Turned out attention to the tibia. After irrigating and suctioning it dry, we put cement down the tibia, pressurized it, put a cement restrictor in at the appropriate length on both the  femoral side and the tibial side, pressurized the cement, and then put the tibial component in place, rotationally aligned to where we had the previous stem and took care not to get any cement into the fracture site of the tibial plateau fracture.  Once  we got that in, we turned to the femur, irrigated it out, suctioned to dry.  We put a cement  restrictor in and we filled up the femur with cement and then used the 5 femur with an 80-mm stem to get better purchase on that side.  Once that was done, the  trial 6 was put in place.  It felt a little loose in flexion and extension, but we left that and then went to the patella.  The patella was cemented into place.  We waited for the cement to completely harden as we did not really want to move the knee  much in that whole situation.  Once the cement was completely hardened, we let the tourniquet down and at that point, trialed her with an 8 poly.  I felt like  that was a really good fit in both flexion and extension.  So we went and went ahead and opened  that and put that in place.  A full synovectomy was performed superiorly at this point and then attention was turned towards the patellar tendon pull-off.  I took a small drill pin from the set and opened up the cortex over the tibial tubercle.  We then  put an All Suture anchor into that small hole, set it by pulling them all back, and then used the FiberTape and labral tape to repair the patellar tendon.  I then took the medial patellofemoral arthrotomy and closed it with 1 Vicryl running up and down,  which got [place] to a really nice repair of that.  I put it through a range of motion.  I really could put it through a full range of motion without tremendous stress on the patellar tendon, but I still felt that with the tibial plateau fracture and the  patellar tendon, that we needed to hold her still postoperatively.  At this point, once the cement was all hardened and we had done the patellar tendon repair, we did the medial parapatellar arthrotomy closure with 1 Vicryl running up and down and then closed that.  We then closed the skin with 2-0 Vicryl and 3-0  Monocryl subcuticular.  Benzoin and Steri-Strips applied.  Sterile pressure dressing was applied and the patient was taken to the recovery room and noted to be in satisfactory condition.   Estimated blood loss for the procedure was minimal.   MUK D: 09/14/2023 3:31:53 pm T: 09/14/2023 9:21:00 pm  JOB: [id number]/ [id number]

## 2023-09-14 NOTE — Anesthesia Procedure Notes (Signed)
Spinal  Patient location during procedure: OR Start time: 09/14/2023 12:32 PM End time: 09/14/2023 12:35 PM Reason for block: surgical anesthesia Staffing Performed: anesthesiologist  Anesthesiologist: Ludlow Nation, MD Performed by: Stephenson Nation, MD Authorized by: Redgranite Nation, MD   Preanesthetic Checklist Completed: patient identified, IV checked, site marked, risks and benefits discussed, surgical consent, monitors and equipment checked, pre-op evaluation and timeout performed Spinal Block Patient position: sitting Prep: DuraPrep Patient monitoring: heart rate, cardiac monitor, continuous pulse ox and blood pressure Approach: midline Location: L4-5 Needle Needle type: Pencan  Needle gauge: 24 G Assessment Sensory level: T6 Additional Notes Functioning IV was confirmed and monitors were applied. Sterile prep and drape, including hand hygiene and sterile gloves were used. The patient was positioned and the spine was prepped. The skin was anesthetized with lidocaine.  Free flow of clear CSF was obtained prior to injecting local anesthetic into the CSF.  The spinal needle aspirated freely following injection.  The needle was carefully withdrawn.  The patient tolerated the procedure well.

## 2023-09-14 NOTE — Transfer of Care (Addendum)
Immediate Anesthesia Transfer of Care Note  Patient: [name]  Procedure(s) Performed: TOTAL KNEE ARTHROPLASTY (Left: Knee)  Patient Location: PACU  Anesthesia Type:MAC combined with regional for post-op pain; spinal for procedure  Level of Consciousness: awake, patient cooperative, and confused  Airway & Oxygen Therapy: Patient Spontanous Breathing  Post-op Assessment: Report given to RN and Post -op Vital signs reviewed and stable  Post vital signs: Reviewed  Last Vitals:  Vitals Value Taken Time  BP 131/97 09/14/23 1555  Temp    Pulse 70 09/14/23 1555  Resp 22 09/14/23 1557  SpO2 98   Vitals shown include unfiled device data.  Last Pain:  Vitals:   09/14/23 1115  TempSrc:   PainSc: 0-No pain         Complications: Pt with some confusion and agitation upon emerging from sedation en route to PACU, pt A&O x2 but not able to determine why she is here and why her legs are not moving; spinal explained to patient and precedex admin to pt to help with agitation.

## 2023-09-15 ENCOUNTER — Encounter (HOSPITAL_COMMUNITY): Payer: Self-pay | Admitting: Orthopedic Surgery

## 2023-09-15 ENCOUNTER — Other Ambulatory Visit (HOSPITAL_COMMUNITY): Payer: Self-pay

## 2023-09-15 DIAGNOSIS — K219 Gastro-esophageal reflux disease without esophagitis: Secondary | ICD-10-CM | POA: Diagnosis not present

## 2023-09-15 DIAGNOSIS — S82126A Nondisplaced fracture of lateral condyle of unspecified tibia, initial encounter for closed fracture: Secondary | ICD-10-CM | POA: Diagnosis not present

## 2023-09-15 DIAGNOSIS — S72436A Nondisplaced fracture of medial condyle of unspecified femur, initial encounter for closed fracture: Secondary | ICD-10-CM | POA: Diagnosis not present

## 2023-09-15 DIAGNOSIS — E039 Hypothyroidism, unspecified: Secondary | ICD-10-CM | POA: Diagnosis not present

## 2023-09-15 DIAGNOSIS — I10 Essential (primary) hypertension: Secondary | ICD-10-CM | POA: Diagnosis not present

## 2023-09-15 DIAGNOSIS — M1712 Unilateral primary osteoarthritis, left knee: Secondary | ICD-10-CM | POA: Diagnosis not present

## 2023-09-15 DIAGNOSIS — M069 Rheumatoid arthritis, unspecified: Secondary | ICD-10-CM | POA: Diagnosis not present

## 2023-09-15 DIAGNOSIS — M810 Age-related osteoporosis without current pathological fracture: Secondary | ICD-10-CM | POA: Diagnosis not present

## 2023-09-15 DIAGNOSIS — J45909 Unspecified asthma, uncomplicated: Secondary | ICD-10-CM | POA: Diagnosis not present

## 2023-09-15 NOTE — Anesthesia Postprocedure Evaluation (Signed)
Anesthesia Post Note  Patient: [name]  Procedure(s) Performed: TOTAL KNEE ARTHROPLASTY (Left: Knee)     Patient location during evaluation: PACU Anesthesia Type: Regional, MAC and Spinal Level of consciousness: awake and alert Pain management: pain level controlled Vital Signs Assessment: post-procedure vital signs reviewed and stable Respiratory status: spontaneous breathing, nonlabored ventilation, respiratory function stable and patient connected to nasal cannula oxygen Cardiovascular status: stable and blood pressure returned to baseline Postop Assessment: no apparent nausea or vomiting Anesthetic complications: no   No notable events documented.  Last Vitals:  Vitals:   09/15/23 0526 09/15/23 0940  BP: (!) 150/74 134/80  Pulse: 69 (!) 59  Resp: 16 16  Temp: (!) 36.4 C 36.4 C  SpO2: 95% 97%    Last Pain:  Vitals:   09/15/23 1243  TempSrc:   PainSc: 9                  Reynold Mantell P Harrietta Incorvaia

## 2023-09-15 NOTE — Care Management Obs Status (Signed)
MEDICARE OBSERVATION STATUS NOTIFICATION   Patient Details  Name: [name] MRN: [id number] Date of Birth: 1946-04-27   Medicare Observation Status Notification Given:  Hart Robinsons, LCSW 09/15/2023, 1:17 PM

## 2023-09-15 NOTE — Progress Notes (Signed)
Physical Therapy Treatment Patient Details Name: [name] MRN: [id number] DOB: 1946/07/23 Today's Date: 09/15/2023   History of Present Illness 77 yo female s/p Left total knee arthroplasty with patellar tendon repair due to arthritis and stress fractures. PMHx: recent L femur fx after falling at home s/p IM nail 9/6, anemia, OA, RA, R TSA 2015    PT Comments  Pt and spouse educated on safely donning KI.  Pt ambulated short distance and practiced steps.  Pt reporting 8/10 left knee pain and spouse notified RN of need for pain meds.  Pt and spouse eager to d/c home today.  Pt and spouse had no further questions.     If plan is discharge home, recommend the following: Help with stairs or ramp for entrance;Assistance with cooking/housework;Assist for transportation   Can travel by private vehicle        Equipment Recommendations  None recommended by PT    Recommendations for Other Services       Precautions / Restrictions Precautions Precautions: Fall;Knee Precaution Comments: no knee flexion Required Braces or Orthoses: Knee Immobilizer - Left Knee Immobilizer - Left: On at all times Restrictions Other Position/Activity Restrictions: WBAT     Mobility  Bed Mobility Overal bed mobility: Needs Assistance Bed Mobility: Supine to Sit     Supine to sit: Contact guard, HOB elevated     General bed mobility comments: pt in recliner and standing with spouse finishing getting dressed (without KI); pt assisted with donning KI and reviewed precautions, spouse educated on KI application since he will assist her at home    Transfers Overall transfer level: Needs assistance Equipment used: Rolling walker (2 wheels) Transfers: Sit to/from Stand Sit to Stand: Supervision           General transfer comment: cues for UE and LE positioning    Ambulation/Gait Ambulation/Gait assistance: Contact guard assist, Supervision Gait Distance (Feet): 30 Feet Assistive device:  Rolling walker (2 wheels) Gait Pattern/deviations: Step-to pattern, Decreased stance time - left, Antalgic Gait velocity: decr     General Gait Details: verbal cues for RW positioning, posture, sequence, pain limiting distance; pt also wishing to d/c home after session   Stairs Stairs: Yes Stairs assistance: Contact guard assist Stair Management: Step to pattern, Forwards, Two rails Number of Stairs: 2 General stair comments: verbal cues for safety and sequence; pt has 2 posts at home so simulated posts (declined practicing with RW backwards); pt performed twice with spouse holding gait belt second performance; both pt and spouse report understanding   Wheelchair Mobility     Tilt Bed    Modified Rankin (Stroke Patients Only)       Balance                                            Cognition Arousal: Alert Behavior During Therapy: WFL for tasks assessed/performed Overall Cognitive Status: Within Functional Limits for tasks assessed                                          Exercises      General Comments        Pertinent Vitals/Pain Pain Assessment Pain Assessment: 0-10 Pain Score: 8  Pain Location: left knee Pain Descriptors / Indicators: Sore, Aching Pain  Intervention(s): Monitored during session, Repositioned, Patient requesting pain meds-RN notified    Home Living                          Prior Function            PT Goals (current goals can now be found in the care plan section) Progress towards PT goals: Progressing toward goals    Frequency    7X/week      PT Plan      Co-evaluation              AM-PAC PT "6 Clicks" Mobility   Outcome Measure  Help needed turning from your back to your side while in a flat bed without using bedrails?: A Little Help needed moving from lying on your back to sitting on the side of a flat bed without using bedrails?: A Little Help needed moving to and  from a bed to a chair (including a wheelchair)?: A Little Help needed standing up from a chair using your arms (e.g., wheelchair or bedside chair)?: A Little Help needed to walk in hospital room?: A Little Help needed climbing 3-5 steps with a railing? : A Little 6 Click Score: 18    End of Session Equipment Utilized During Treatment: Gait belt Activity Tolerance: Patient tolerated treatment well Patient left: in chair;with call bell/phone within reach;with family/visitor present Nurse Communication: Mobility status PT Visit Diagnosis: Difficulty in walking, not elsewhere classified (R26.2)     Time: 1610-9604 PT Time Calculation (min) (ACUTE ONLY): 12 min  Charges:    $Gait Training: 8-22 mins PT General Charges $$ ACUTE PT VISIT: 1 Visit                     Paulino Door, DPT Physical Therapist Acute Rehabilitation Services Office: [phone]    [name] 09/15/2023, 4:37 PM

## 2023-09-15 NOTE — Evaluation (Signed)
Physical Therapy Evaluation Patient Details Name: [name] MRN: [id number] DOB: [birth year] Today's Date: 09/15/2023  History of Present Illness  77 yo female s/p Left total knee arthroplasty with patellar tendon repair due to arthritis and stress fractures. PMHx: recent L femur fx after falling at home s/p IM nail 9/6, anemia, OA, RA, R TSA 2015  Clinical Impression  Pt is s/p TKA resulting in the deficits listed below (see PT Problem List).  Pt will benefit from acute skilled PT to increase their independence and safety with mobility to allow discharge.  Pt up to [facility] prior to arrival.  Pt educated on maintaining KI with mobility (snug fit, no ice) to ensure knee extension.  Pt assisted with ambulating however only tolerated short distance due to fatigue.  Pt reports pain better controlled today when compared to last night.  Pt hopeful to d/c home later today.          If plan is discharge home, recommend the following: Help with stairs or ramp for entrance;Assistance with cooking/housework;Assist for transportation   Can travel by private vehicle        Equipment Recommendations None recommended by PT  Recommendations for Other Services       Functional Status Assessment Patient has had a recent decline in their functional status and demonstrates the ability to make significant improvements in function in a reasonable and predictable amount of time.     Precautions / Restrictions Precautions Precautions: Fall;Knee Precaution Comments: no knee flexion Required Braces or Orthoses: Knee Immobilizer - Left Knee Immobilizer - Left: On at all times Restrictions Weight Bearing Restrictions: No Other Position/Activity Restrictions: WBAT      Mobility  Bed Mobility Overal bed mobility: Needs Assistance Bed Mobility: Supine to Sit     Supine to sit: Contact guard, HOB elevated          Transfers Overall transfer level: Needs assistance Equipment used: Rolling  walker (2 wheels) Transfers: Sit to/from Stand Sit to Stand: Contact guard assist           General transfer comment: cues for UE and LE positioning    Ambulation/Gait Ambulation/Gait assistance: Contact guard assist Gait Distance (Feet): 40 Feet Assistive device: Rolling walker (2 wheels) Gait Pattern/deviations: Step-to pattern, Decreased stance time - left, Antalgic Gait velocity: decr     General Gait Details: verbal cues for RW positioning, posture, sequence, distance to tolerance  Stairs            Wheelchair Mobility     Tilt Bed    Modified Rankin (Stroke Patients Only)       Balance                                             Pertinent Vitals/Pain Pain Assessment Pain Assessment: 0-10 Pain Score: 2  Pain Location: left knee Pain Descriptors / Indicators: Sore, Aching Pain Intervention(s): Monitored during session, Repositioned    Home Living Family/patient expects to be discharged to:: Private residence Living Arrangements: Spouse/significant other   Type of Home: House Home Access: Stairs to enter Entrance Stairs-Rails: None Entrance Stairs-Number of Steps: 2   Home Layout: One level Home Equipment: Rollator (4 wheels);Rolling Walker (2 wheels)      Prior Function Prior Level of Function : Independent/Modified Independent  Extremity/Trunk Assessment        Lower Extremity Assessment Lower Extremity Assessment: LLE deficits/detail LLE Deficits / Details: maintained knee extension with KI in place, no knee flexion - patellar tendon repair    Cervical / Trunk Assessment Cervical / Trunk Assessment: Kyphotic;Other exceptions Cervical / Trunk Exceptions: forward head posture  Communication   Communication Communication: No apparent difficulties  Cognition Arousal: Alert Behavior During Therapy: WFL for tasks assessed/performed Overall Cognitive Status: Within Functional Limits for  tasks assessed                                          General Comments      Exercises     Assessment/Plan    PT Assessment Patient needs continued PT services  PT Problem List Decreased strength;Decreased balance;Decreased mobility;Pain;Decreased knowledge of use of DME;Decreased knowledge of precautions       PT Treatment Interventions Functional mobility training;Stair training;Gait training;DME instruction;Balance training;Therapeutic activities;Therapeutic exercise;Patient/family education    PT Goals (Current goals can be found in the Care Plan section)  Acute Rehab PT Goals PT Goal Formulation: With patient/family Time For Goal Achievement: 09/22/23 Potential to Achieve Goals: Good    Frequency 7X/week     Co-evaluation               AM-PAC PT "6 Clicks" Mobility  Outcome Measure Help needed turning from your back to your side while in a flat bed without using bedrails?: A Little Help needed moving from lying on your back to sitting on the side of a flat bed without using bedrails?: A Little Help needed moving to and from a bed to a chair (including a wheelchair)?: A Little Help needed standing up from a chair using your arms (e.g., wheelchair or bedside chair)?: A Little Help needed to walk in hospital room?: A Little Help needed climbing 3-5 steps with a railing? : A Little 6 Click Score: 18    End of Session Equipment Utilized During Treatment: Gait belt Activity Tolerance: Patient tolerated treatment well Patient left: in chair;with call bell/phone within reach;with family/visitor present   PT Visit Diagnosis: Difficulty in walking, not elsewhere classified (R26.2)    Time: 1034-1050 PT Time Calculation (min) (ACUTE ONLY): 16 min   Charges:   PT Evaluation $PT Eval Low Complexity: 1 Low   PT General Charges $$ ACUTE PT VISIT: 1 Visit        Thomasene Mohair PT, DPT Physical Therapist Acute Rehabilitation Services Office:  [phone]   Janan Halter Payson 09/15/2023, 12:27 PM

## 2023-09-15 NOTE — Progress Notes (Signed)
Subjective: 1 Day Post-Op Procedure(s) (LRB): TOTAL KNEE ARTHROPLASTY (Left) Patient reports pain as mild.  Taking by mouth and voiding okay.  She did great with physical therapy and ambulated in the hall with a walker.  No chest pain or shortness of breath.  Patient is sitting in her chair in her room.  Husband is present as well.  Objective: Vital signs in last 24 hours: Temp:  [97.5 F (36.4 C)-98.2 F (36.8 C)] 97.6 F (36.4 C) (11/07 1304) Pulse Rate:  [58-87] 60 (11/07 1304) Resp:  [14-18] 16 (11/07 1304) BP: (121-159)/(61-97) 141/65 (11/07 1304) SpO2:  [93 %-98 %] 98 % (11/07 1304)  Intake/Output from previous day: 11/06 0701 - 11/07 0700 In: 2103.4 [P.O.:240; I.V.:1663.3; IV Piggyback:200.1] Out: 2180 [Urine:2150; Blood:30] Intake/Output this shift: Total I/O In: 100 [P.O.:100] Out: 350 [Urine:350]  Recent Labs    09/13/23 0830  HGB 12.1   Recent Labs    09/13/23 0830  WBC 6.0  RBC 4.05  HCT 38.2  PLT 147*   Recent Labs    09/13/23 0830  NA 139  K 4.6  CL 108  CO2 23  BUN 6*  CREATININE 0.60  GLUCOSE 92  CALCIUM 10.0   No results for input(s): "LABPT", "INR" in the last 72 hours. Left knee exam: Neurovascular intact Sensation intact distally Intact pulses distally Dorsiflexion/Plantar flexion intact No cellulitis present Compartment soft   Assessment/Plan: 1 Day Post-Op Procedure(s) (LRB): TOTAL KNEE ARTHROPLASTY (Left) Severe osteoporosis. Rheumatoid arthritis  Plan: Discharge home.  Patient will basically just do ambulation weightbearing as tolerated on the left with the knee immobilizer.  I have given the patient and her husband instructions. She is in improved condition. She is on a regular diet. Aspirin 81 mg twice daily x 1 month postop for DVT prophylaxis. We suggested she talk with her PCP about doing possibly an injectable medication for her severe osteoporosis. Follow-up with Dr. Luiz Blare in 2 weeks.    Patient's anticipated  LOS is less than 2 midnights, meeting these requirements:  - Lives within 1 hour of care - Has a competent adult at home to recover with post-op recover - NO history of  - Chronic pain requiring opiods  - Diabetes  - Coronary Artery Disease  - Heart failure  - Heart attack  - Stroke  - DVT/VTE  - Cardiac arrhythmia  - Respiratory Failure/COPD  - Renal failure  - Anemia  - Advanced Liver disease     Matthew Folks 09/15/2023, 1:36 PM

## 2023-09-15 NOTE — Plan of Care (Signed)
  Problem: Education: Goal: Knowledge of General Education information will improve Description: Including pain rating scale, medication(s)/side effects and non-pharmacologic comfort measures Outcome: Progressing   Problem: Health Behavior/Discharge Planning: Goal: Ability to manage health-related needs will improve Outcome: Progressing   Problem: Clinical Measurements: Goal: Ability to maintain clinical measurements within normal limits will improve Outcome: Progressing Goal: Will remain free from infection Outcome: Progressing Goal: Diagnostic test results will improve Outcome: Progressing Goal: Respiratory complications will improve Outcome: Progressing Goal: Cardiovascular complication will be avoided Outcome: Progressing   Problem: Activity: Goal: Risk for activity intolerance will decrease Outcome: Progressing   Problem: Nutrition: Goal: Adequate nutrition will be maintained Outcome: Completed/Met   Problem: Coping: Goal: Level of anxiety will decrease Outcome: Progressing   Problem: Elimination: Goal: Will not experience complications related to bowel motility Outcome: Progressing Goal: Will not experience complications related to urinary retention Outcome: Progressing   Problem: Pain Management: Goal: General experience of comfort will improve Outcome: Progressing   Problem: Safety: Goal: Ability to remain free from injury will improve Outcome: Progressing   Problem: Skin Integrity: Goal: Risk for impaired skin integrity will decrease Outcome: Progressing   Problem: Education: Goal: Knowledge of the prescribed therapeutic regimen will improve Outcome: Progressing Goal: Individualized Educational Video(s) Outcome: Completed/Met   Problem: Activity: Goal: Ability to avoid complications of mobility impairment will improve Outcome: Progressing Goal: Range of joint motion will improve Outcome: Progressing   Problem: Clinical Measurements: Goal:  Postoperative complications will be avoided or minimized Outcome: Progressing   Problem: Pain Management: Goal: Pain level will decrease with appropriate interventions Outcome: Progressing   Problem: Skin Integrity: Goal: Will show signs of wound healing Outcome: Progressing

## 2023-09-15 NOTE — TOC Transition Note (Signed)
Transition of Care [facility]) - CM/SW Discharge Note   Patient Details  Name: [name] MRN: [id number] Date of Birth: January 11, 1946  [facility] CM/SW Contact:  Amada Jupiter, LCSW Phone Number: 09/15/2023, 11:14 AM   Clinical Narrative:    Met with pt and spouse who confirm pt has needed DME in the home.  OPPT already arranged with [facility].  No further TOC needs.     Barriers to Discharge: No Barriers Identified   Patient Goals and CMS Choice      Discharge Placement                         Discharge Plan and Services Additional resources added to the After Visit Summary for                  DME Arranged: N/A DME Agency: NA                  Social Determinants of Health (SDOH) Interventions SDOH Screenings   Food Insecurity: No Food Insecurity (09/14/2023)  Housing: Patient Declined (09/14/2023)  Transportation Needs: No Transportation Needs (09/14/2023)  Utilities: Not At Risk (09/14/2023)  Depression (PHQ2-9): Low Risk  (07/21/2022)  Tobacco Use: Medium Risk (09/14/2023)     Readmission Risk Interventions     No data to display

## 2023-09-15 NOTE — Discharge Summary (Signed)
Patient ID: [name] MRN: [id number] DOB/AGE: [birth year] 77 y.o.  Admit date: 09/14/2023 Discharge date: 09/15/2023  Admission Diagnoses:  Principal Problem:   Primary osteoarthritis of left knee Active Problems:   Rheumatoid arthritis (HCC)   Osteoporosis   Discharge Diagnoses:  Same  Past Medical History:  Diagnosis Date   Anemia    Hx of    Arthritis    Calculus of bile duct without mention of cholecystitis or obstruction    Cataract    GERD (gastroesophageal reflux disease)    H. pylori infection    Hx of    Heart murmur    Hyperlipidemia    Hypertension    Hypothyroid    PUD (peptic ulcer disease)    Rheumatoid arthritis(714.0)     Surgeries: Procedure(s): Left TOTAL KNEE ARTHROPLASTY on 09/14/2023   Discharged Condition: Improved  Hospital Course: [name] is an 77 y.o. female who was admitted 09/14/2023 for operative treatment ofPrimary osteoarthritis of left knee. Patient has severe unremitting pain that affects sleep, daily activities, and work/hobbies. After pre-op clearance the patient was taken to the operating room on 09/14/2023 and underwent  Procedure(s): Left TOTAL KNEE ARTHROPLASTY.    Patient was given perioperative antibiotics:  Anti-infectives (From admission, onward)    Start     Dose/Rate Route Frequency Ordered Stop   09/14/23 2200  hydroxychloroquine (PLAQUENIL) tablet 200 mg        200 mg Oral 2 times daily 09/14/23 1729     09/14/23 1830  ceFAZolin (ANCEF) IVPB 2g/100 mL premix        2 g 200 mL/hr over 30 Minutes Intravenous Every 6 hours 09/14/23 1729 09/15/23 0030   09/14/23 0945  ceFAZolin (ANCEF) IVPB 2g/100 mL premix        2 g 200 mL/hr over 30 Minutes Intravenous On call to O.R. 09/14/23 0934 09/14/23 1243        Patient was given sequential compression devices, early ambulation, and chemoprophylaxis to prevent DVT.  Patient benefited maximally from hospital stay and there were no complications.    Recent  vital signs: Patient Vitals for the past 24 hrs:  BP Temp Temp src Pulse Resp SpO2  09/15/23 1304 (!) 141/65 97.6 F (36.4 C) -- 60 16 98 %  09/15/23 0940 134/80 97.6 F (36.4 C) -- (!) 59 16 97 %  09/15/23 0526 (!) 150/74 (!) 97.5 F (36.4 C) -- 69 16 95 %  09/15/23 0200 (!) 155/81 98 F (36.7 C) Oral 69 16 93 %  09/14/23 2132 (!) 159/95 98.2 F (36.8 C) Oral 87 16 98 %  09/14/23 1630 -- -- -- -- 14 94 %  09/14/23 1615 129/61 -- -- (!) 58 15 97 %  09/14/23 1600 121/61 -- -- 66 17 95 %  09/14/23 1555 (!) 131/97 97.8 F (36.6 C) -- 70 18 95 %     Recent laboratory studies:  Recent Labs    09/13/23 0830  WBC 6.0  HGB 12.1  HCT 38.2  PLT 147*  NA 139  K 4.6  CL 108  CO2 23  BUN 6*  CREATININE 0.60  GLUCOSE 92  CALCIUM 10.0     Discharge Medications:   Allergies as of 09/15/2023       Reactions   Tape Other (See Comments)   Paper tape ONLY, please!! Not allergic, sensitive   Ace Inhibitors Cough   Ambien [zolpidem] Other (See Comments)   "Odd feeling"   Crestor [rosuvastatin] Other (See  Comments)   Sore muscles   Leflunomide Other (See Comments)   Elevated LFTs   Pravastatin Other (See Comments)   Sore muscles   Prozac [fluoxetine Hcl] Other (See Comments)   Decreased libido   Zoloft [sertraline Hcl] Other (See Comments)   Reaction not recalled        Medication List     STOP taking these medications    aspirin 81 MG tablet Replaced by: aspirin EC 81 MG tablet   ibuprofen 800 MG tablet Commonly known as: IBU       TAKE these medications    aspirin EC 81 MG tablet Take 1 tablet (81 mg total) by mouth 2 (two) times daily with a meal. Take x 1 month post op to decrease risk of blood clots. Replaces: aspirin 81 MG tablet   celecoxib 200 MG capsule Commonly known as: CeleBREX Take 1 capsule (200 mg total) by mouth daily.   docusate sodium 100 MG capsule Commonly known as: Colace Take 1 capsule (100 mg total) by mouth 2 (two) times daily.    folic acid 1 MG tablet Commonly known as: FOLVITE Take 1-3 mg by mouth daily.   gabapentin 600 MG tablet Commonly known as: NEURONTIN TAKE 1/2 TO 1 TABLET 2 TO 3 TIMES DAILY AS NEEDED FOR PAIN   HYDROcodone-acetaminophen 5-325 MG tablet Commonly known as: Norco Take 1-2 tablets by mouth every 6 (six) hours as needed for moderate pain (pain score 4-6). What changed: reasons to take this   hydroxychloroquine 200 MG tablet Commonly known as: PLAQUENIL Take 200 mg by mouth 2 (two) times daily.   levothyroxine 88 MCG tablet Commonly known as: SYNTHROID Take 88 mcg by mouth daily before breakfast.   magnesium gluconate 500 MG tablet Commonly known as: MAGONATE Take 500 mg by mouth See admin instructions. Take 500 mg by mouth at bedtime on Mon/Wed/Fri   methotrexate 50 MG/2ML injection Inject 25 mg into the skin every Thursday.   omeprazole 40 MG capsule Commonly known as: PRILOSEC TAKE 1 CAPSULE TWICE DAILY TO PREVENT HEARTBURN AND INDIGESTION What changed:  how much to take how to take this when to take this reasons to take this additional instructions   tiZANidine 2 MG tablet Commonly known as: ZANAFLEX Take 1 tablet (2 mg total) by mouth every 8 (eight) hours as needed for muscle spasms.   traZODone 150 MG tablet Commonly known as: DESYREL TAKE 1 TABLET 1 HOUR BEFORE BEDTIME AS NEEDED FOR SLEEP   vitamin C 1000 MG tablet Take 1,000 mg by mouth at bedtime.   Vitamin D3 125 MCG (5000 UT) Caps Take 5,000 Units by mouth 3 (three) times a week.   zinc gluconate 50 MG tablet Take 50 mg by mouth daily. 3 times a week               Discharge Care Instructions  (From admission, onward)           Start     Ordered   09/15/23 0000  Weight bearing as tolerated       Comments: Wearing knee immobilizer and using a walker.  At this point we will do range of motion of the left knee 0 degrees to 30 degrees only x 1 month postop.  Question Answer Comment   Laterality left   Extremity Lower      09/15/23 1340            Diagnostic Studies: No results found.  Disposition: Discharge disposition: 01-Home or  Self Care       Discharge Instructions     Call MD / Call 911   Complete by: As directed    If you experience chest pain or shortness of breath, CALL 911 and be transported to the hospital emergency room.  If you develope a fever above 101 F, pus (white drainage) or increased drainage or redness at the wound, or calf pain, call your surgeon's office.   Constipation Prevention   Complete by: As directed    Drink plenty of fluids.  Prune juice may be helpful.  You may use a stool softener, such as Colace (over the counter) 100 mg twice a day.  Use MiraLax (over the counter) for constipation as needed.   Diet general   Complete by: As directed    Increase activity slowly as tolerated   Complete by: As directed    Post-operative opioid taper instructions:   Complete by: As directed    POST-OPERATIVE OPIOID TAPER INSTRUCTIONS: It is important to wean off of your opioid medication as soon as possible. If you do not need pain medication after your surgery it is ok to stop day one. Opioids include: Codeine, Hydrocodone(Norco, Vicodin), Oxycodone(Percocet, oxycontin) and hydromorphone amongst others.  Long term and even short term use of opiods can cause: Increased pain response Dependence Constipation Depression Respiratory depression And more.  Withdrawal symptoms can include Flu like symptoms Nausea, vomiting And more Techniques to manage these symptoms Hydrate well Eat regular healthy meals Stay active Use relaxation techniques(deep breathing, meditating, yoga) Do Not substitute Alcohol to help with tapering If you have been on opioids for less than two weeks and do not have pain than it is ok to stop all together.  Plan to wean off of opioids This plan should start within one week post op of your joint  replacement. Maintain the same interval or time between taking each dose and first decrease the dose.  Cut the total daily intake of opioids by one tablet each day Next start to increase the time between doses. The last dose that should be eliminated is the evening dose.      Weight bearing as tolerated   Complete by: As directed    Wearing knee immobilizer and using a walker.  At this point we will do range of motion of the left knee 0 degrees to 30 degrees only x 1 month postop.   Laterality: left   Extremity: Lower     The patient had severe osteoporosis noted at the time of her surgery.  Her bones were very soft and fragile.  We would like her to see Dr. Anne Fu for medical management of this.   Follow-up Information     [name], MD. Go on 09/27/2023.   Specialty: Orthopedic Surgery Why: Your appointment is scheduled for 10:45 Contact information: [address] [place] [address] [phone]         [facility] Follow up.   Why: HHPT will provide 6 HHPT visits prior to starting outpatient physical therapy        [facility] Go on 09/27/2023.   Why: Your outpatient physical therapy has been scheduled for 1:20. Please arrive at 1:00 to complete paperwork Contact information: Physical Therapy [address] [place] [address] [phone]         Lucky Cowboy, MD. Schedule an appointment as soon as possible for a visit in 2 week(s).   Specialty: Internal Medicine Why: To discuss further treatment  of her severe osteoporosis. Contact information: [address] [place] [address] [phone]                  Signed: Matthew Folks 09/15/2023, 1:44 PM

## 2023-09-21 ENCOUNTER — Ambulatory Visit (INDEPENDENT_AMBULATORY_CARE_PROVIDER_SITE_OTHER): Payer: Medicare PPO | Admitting: Internal Medicine

## 2023-09-21 ENCOUNTER — Encounter: Payer: Self-pay | Admitting: Internal Medicine

## 2023-09-21 VITALS — BP 138/80 | HR 73 | Temp 98.0°F | Resp 16 | Ht 61.0 in | Wt 141.0 lb

## 2023-09-21 DIAGNOSIS — M810 Age-related osteoporosis without current pathological fracture: Secondary | ICD-10-CM

## 2023-09-21 DIAGNOSIS — M818 Other osteoporosis without current pathological fracture: Secondary | ICD-10-CM | POA: Diagnosis not present

## 2023-09-21 DIAGNOSIS — M069 Rheumatoid arthritis, unspecified: Secondary | ICD-10-CM

## 2023-09-21 DIAGNOSIS — M1712 Unilateral primary osteoarthritis, left knee: Secondary | ICD-10-CM

## 2023-09-21 MED ORDER — ALENDRONATE SODIUM 70 MG PO TABS: ORAL_TABLET | ORAL | 3 refills | Status: DC

## 2023-09-21 NOTE — Patient Instructions (Addendum)

## 2023-09-21 NOTE — Progress Notes (Signed)
Future Appointments  Date Time Provider Department  10/27/2023 11:00 AM Raynelle Dick, NP [facility]  01/30/2024 10:30 AM Lucky Cowboy, MD [facility]  05/03/2024 10:30 AM Raynelle Dick, NP [facility]  08/07/2024 10:00 AM Lucky Cowboy, MD [facility] Follow-Up      This very nice 77 y.o. MWF was admitted to the hospital on  09/14/2023  and patient was discharged from the hospital  7 days ago on   09/15/2023 . Patient was  admitted electively for severe unremitting pain of the Left knee for an elective Left Total Knee Arthroplasty  by Dr Luiz Blare.  Surgery was successful & uncomplicated & she was discharged the next day post op wearing a knee immobilizer and using a walker.  Patient 's bones were noted very osteoporotic at surgery. Last BMD in 2020 showed Osteoporosis (T  - 4.3) & patient had been on Fosamax, but apparently for nebulous reasons , she has apparently stopped taking her Fosamax sometime during the last 6 months . The patient now presents for follow up for transition from recent hospitalization .  The day after discharge  our clinical staff contacted the patient to assure stability and schedule a follow up appointment. The discharge summary, medications and diagnostic test results were reviewed before meeting with the patient. The patient was admitted for:    1)  Primary osteoarthritis of knee, left    2) Rheumatoid arthritis involving multiple sites    3) Osteoporosis            Hospitalization discharge instructions and medications are reconciled with the patient.      Patient is also followed with Hypertension, HLD, Rheumatoid Arthritis, Prediabetes , Hypothyroidism  and Vitamin D Deficiency. Patient  is followed by Dr Orlin Hilding for Rheumatoid Arthritis (1985) and is on MTX /Plaquenil . CXR in 2021 showed Aortic Atherosclerosis. Her GERD is controlled on her meds. Hyperlipidemia, Pre-Diabetes and Vitamin D Deficiency.        Patient is  treated for HTN  (2007) & BP has been controlled at home. Today's BP is at goal - 138/80. Patient has had no complaints of any cardiac type chest pain, palpitations, dyspnea/ orthopnea/ PND, dizziness, claudication or dependent edema.       Hyperlipidemia is controlled with diet & meds. Patient denies myalgias or other med SE's. Last Lipids were at goal :  Lab Results  Component Value Date   CHOL 169 07/25/2023   HDL 63 07/25/2023   LDLCALC 89 07/25/2023   TRIG 81 07/25/2023   CHOLHDL 2.7 07/25/2023        Also, the patient has history of PreDiabetes (A1c 5.8% /2011) and has had no symptoms of reactive hypoglycemia, diabetic polys, paresthesias or visual blurring.  Last A1c was at goal :  Lab Results  Component Value Date   HGBA1C 5.0 07/25/2023        Patient was diagnosed Hypothyroid  and has been on thyroid replacement since the 1980's. She is followed by Dr Debara Pickett for thyroid.       Further, the patient also has history of Vitamin D Deficiency ("38" /2008) and supplements vitamin D without any suspected side-effects. Last vitamin D was  at goal : Lab Results  Component Value Date   VD25OH 78 07/25/2023     Current Outpatient Medications on File Prior to Visit  Medication Sig   VITAMIN C 1000 MG tablet Take 1,000 mg by mouth at bedtime.  aspirin EC 81 MG tablet Take 1 tablet ( 2 (two) times daily with a meal. Take x 1 month post op to decrease risk of blood clots.   CELEBREX  200 MG capsule Take 1 capsule  daily.   VITAMIN D  5000 u Take 5,000 Units 3 x /week.   COLACE 100 MG capsule Take 1 capsule  2 (two) times daily.   folic acid 1 MG tablet Take 1-3 mg  daily.   gabapentin 300 MG tablet TAKE 1 capsule 2 TO 3 TIMES DAILY AS NEEDED FOR PAIN   HYDROcodone-apap  5-325 MG tablet Take 1-2 tablets  every 6 hours as needed for moderate pain (pain score 4-6).   hydroxychloroquine (PLAQUENIL) 200 MG tablet Take 200 mg  2 (two) times daily.   levothyroxine  88 MCG tablet  Take daily before breakfast.   magnesium 500 MG tablet Take 500 mg at bedtime on MWFri   methotrexate 50 MG/2ML injection Inject 25 mg into the skin every Thursday.   omeprazole 40 MG capsule TAKE 1 CAP TWICE DAILY TION    tiZANidine (ZANAFLEX) 2 MG tablet Take 1 tablet  every 8 hours as needed   traZODone 150 MG tablet TAKE 1 TABLET 1 HOUR BEFORE BEDTIME AS NEEDED FOR SLEEP   zinc 50 MG tablet Take daily 3 times a week     PMHx:   Past Medical History:  Diagnosis Date   Anemia    Hx of    Arthritis    Calculus of bile duct without mention of cholecystitis or obstruction    Cataract    GERD (gastroesophageal reflux disease)    H. pylori infection    Hx of    Heart murmur    Hyperlipidemia    Hypertension    Hypothyroid    PUD (peptic ulcer disease)    Rheumatoid arthritis(714.0)    Immunization History  Administered Date(s) Administered   Influenza, High Dose  07/16/2014, 09/17/2015, 07/01/2016, 08/04/2017, 08/25/2018   Pneumococcal -13 10/16/2015   Pneumococcal -23 11/08/2012   Td 11/08/2012   Zoster, Live 09/17/2015   Past Surgical History:  Procedure Laterality Date   AV FISTULA REPAIR     BLADDER SUSPENSION     BREAST CYST EXCISION Left    BUNIONECTOMY     CARPAL TUNNEL RELEASE     CHOLECYSTECTOMY  09/07/2011   CYSTECTOMY     left breast   ERCP  09/29/2011   Procedure: ENDOSCOPIC RETROGRADE CHOLANGIOPANCREATOGRAPHY (ERCP);  Surgeon: Louis Meckel, MD;  Location: [facility];  Service: Endoscopy;  Laterality: N/A;   INTRAMEDULLARY (IM) NAIL INTERTROCHANTERIC Left 07/15/2023   Procedure: INTRAMEDULLARY (IM) NAIL INTERTROCHANTERIC;  Surgeon: Luci Bank, MD;  Location: [facility];  Service: Orthopedics;  Laterality: Left;   PILONIDAL CYST EXCISION     TENOLYSIS  10/26/2011   Procedure: TENDON SHEATH RELEASE/TENOLYSIS;  Surgeon: Nicki Reaper, MD;  Location: [facility];  Service: Orthopedics;  Laterality: Right;  tenosynovectomy removal superficialis  slip right index finger   TONSILLECTOMY     TOTAL KNEE ARTHROPLASTY Left 09/14/2023   Procedure: TOTAL KNEE ARTHROPLASTY;  Surgeon: [name], MD;  Location: [facility];  Service: Orthopedics;  Laterality: Left;   TOTAL SHOULDER REPLACEMENT Right 02/22/2014   Dr. Malka So at [facility] RELEASE     VAGINAL HYSTERECTOMY     ovaries not removed   FHx:    Reviewed / unchanged  SHx:    Reviewed / unchanged  Systems Review:  Constitutional: Denies fever, chills, wt changes, headaches, insomnia, fatigue, night sweats, change in appetite. Eyes: Denies redness, blurred vision, diplopia, discharge, itchy, watery eyes.  ENT: Denies discharge, congestion, post nasal drip, epistaxis, sore throat, earache, hearing loss, dental pain, tinnitus, vertigo, sinus pain, snoring.  CV: Denies chest pain, palpitations, irregular heartbeat, syncope, dyspnea, diaphoresis, orthopnea, PND, claudication or edema. Respiratory: denies cough, dyspnea, DOE, pleurisy, hoarseness, laryngitis, wheezing.  Gastrointestinal: Denies dysphagia, odynophagia, heartburn, reflux, water brash, abdominal pain or cramps, nausea, vomiting, bloating, diarrhea, constipation, hematemesis, melena, hematochezia  or hemorrhoids. Genitourinary: Denies dysuria, frequency, urgency, nocturia, hesitancy, discharge, hematuria or flank pain. Musculoskeletal:  Apparently walking w/ limited weight bearing of the LLE at home.   Skin: Denies pruritus, rash, hives, warts, acne, eczema or change in skin lesion(s). Neuro: No weakness, tremor, incoordination, spasms, paresthesia or pain. Psychiatric: Denies confusion, memory loss or sensory loss. Endo: Denies change in weight, skin or hair change.  Heme/Lymph: No excessive bleeding, bruising or enlarged lymph nodes.  Physical Exam  BP 138/80   Pulse 73   Temp 98 F (36.7 C)   Resp 16   Ht 5\' 1"  (1.549 m)   Wt 141 lb (64 kg)   SpO2 99%   BMI 26.64 kg/m   Appears well nourished and  in no distress.  Eyes: PERRLA, EOMs, conjunctiva no swelling or erythema. Sinuses: No frontal/maxillary tenderness ENT/Mouth: EAC's clear, TM's nl w/o erythema, bulging. Nares clear w/o erythema, swelling, exudates. Oropharynx clear without erythema or exudates. Oral hygiene is good. Tongue normal, non obstructing. Hearing intact.  Neck: Supple. Thyroid nl. Car 2+/2+ without bruits, nodes or JVD. Chest: Respirations nl with BS clear & equal w/o rales, rhonchi, wheezing or stridor.  Cor: Heart sounds normal w/ regular rate and rhythm without sig. murmurs, gallops, clicks or rubs. Peripheral pulses 1+ and equal  without edema.  Abdomen: Soft & bowel sounds normal. Non-tender w/o guarding, rebound, hernias, masses or organomegaly.  Lymphatics: Unremarkable.  Musculoskeletal:  Generalized decrease in muscle power , tone & bulk. Patient   in a wheelchair & guarding any movement of her extended Lt knee.  Skin: Warm, dry without exposed rashes, lesions or ecchymosis apparent.  Neuro: Cranial nerves intact, reflexes equal bilaterally. Sensory-motor testing grossly intact. Tendon reflexes grossly intact.  Pysch: Alert & oriented x 3.  Insight and judgement nl & appropriate. No ideations.        Discussed with patient & husband referral to Dr Sharl Ma for opinion whether Osteoporosis treatment other than Fosamax as Prolia or other may be more beneficial. For now, will re-send Rx for  Fosamax and have ordered a BMD scan at the [facility] when she feels that she will be able to position for the test.        Discussed  regular exercise when able , BP monitoring, weight control to achieve/maintain BMI less than 25 and discussed meds and SE's. Recommended labs to assess and monitor clinical status with further disposition pending results of labs. Over 30 minutes of exam, counseling, chart review was performed.   Marinus Maw, MD

## 2023-09-27 DIAGNOSIS — M25562 Pain in left knee: Secondary | ICD-10-CM | POA: Diagnosis not present

## 2023-10-04 DIAGNOSIS — Z8781 Personal history of (healed) traumatic fracture: Secondary | ICD-10-CM | POA: Diagnosis not present

## 2023-10-04 DIAGNOSIS — M810 Age-related osteoporosis without current pathological fracture: Secondary | ICD-10-CM | POA: Diagnosis not present

## 2023-10-04 DIAGNOSIS — E039 Hypothyroidism, unspecified: Secondary | ICD-10-CM | POA: Diagnosis not present

## 2023-10-18 DIAGNOSIS — M25562 Pain in left knee: Secondary | ICD-10-CM | POA: Diagnosis not present

## 2023-10-24 DIAGNOSIS — M25662 Stiffness of left knee, not elsewhere classified: Secondary | ICD-10-CM | POA: Diagnosis not present

## 2023-10-24 DIAGNOSIS — M6281 Muscle weakness (generalized): Secondary | ICD-10-CM | POA: Diagnosis not present

## 2023-10-24 DIAGNOSIS — Z96652 Presence of left artificial knee joint: Secondary | ICD-10-CM | POA: Diagnosis not present

## 2023-10-26 DIAGNOSIS — M25662 Stiffness of left knee, not elsewhere classified: Secondary | ICD-10-CM | POA: Diagnosis not present

## 2023-10-26 DIAGNOSIS — Z96652 Presence of left artificial knee joint: Secondary | ICD-10-CM | POA: Diagnosis not present

## 2023-10-26 DIAGNOSIS — M6281 Muscle weakness (generalized): Secondary | ICD-10-CM | POA: Diagnosis not present

## 2023-10-26 NOTE — Progress Notes (Unsigned)
MEDICARE ANNUAL WELLNESS AND FOLLOW UP 3 MONTH  Assessment & Plan:   Encounter for medicare annual wellness visit Due yearly Mammogram 09/21/22 DEXA scheduled for 11/2023  Essential hypertension Currently on no medications Monitor blood pressure at home; call if consistently over 130/80 Continue DASH diet.   Reminder to go to the ER if any CP, SOB, nausea, dizziness, severe HA, changes vision/speech, left arm numbness and tingling and jaw pain. -     CBC with Differential/Platelet -     COMPLETE METABOLIC PANEL WITH GFR -     TSH - continue medications, DASH diet, exercise and monitor at home. Call if greater than 130/80.   Hyperlipidemia, mixed -     Lipid panel check lipids decrease fatty foods increase activity.   Hypothyroidism, unspecified type Taking levothyroxine 88 mcg 1tab daily  Reminder to take on an empty stomach 30-18mins before first meal of the day. No antacid medications for 4 hours. -     TSH  Abnormal glucose Discussed disease progression and risks Discussed diet/exercise, weight management and risk modification  Vitamin D deficiency Continue Vit D supplementation to maintain value in therapeutic level of 60-100   Hyperparathyroidism (HCC) - PTH  Aortic atherosclerosis (HCC) Control blood pressure, cholesterol, glucose, increase exercise.   Rheumatoid arthritis involving multiple sites, unspecified whether rheumatoid factor present (HCC) Continue follow up Currently on Plaquenil every day , Methotrexate 25 mg once a week   Depression, major, single episode, in partial remission (HCC) Doing well at this time, continue to monitor No medications:  Discussed stress management techniques  Discussed, increase water,intake & good sleep hygiene  Discussed increasing exercise & vegetables in diet  GERD Doing well at this time Continue: omeprazole 40mg  BID PRN Diet discussed Monitor for triggers Avoid food with high acid content Avoid excessive  cafeine Increase water intake  Medication management Continued  Mid back pain on right side Ibuprofen 800 mg every 8 hours as needed Baclofen 10 mg BID as needed Rest and heat Monitor and if not improving notify the office  Osteoporosis Encouraged weight bearing exercises Stop Fosamax for 1 year Continue Vit D supplementation Has upcoming DEXA in January  S/P Left TKA Continue medications Follow with orthopedics Continue PT   Future Appointments  Date Time Provider Department Center  01/30/2024 10:30 AM Lucky Cowboy, MD [facility] None  05/03/2024 10:30 AM Raynelle Dick, NP [facility] None  08/07/2024 10:00 AM Lucky Cowboy, MD [facility] None  10/30/2024 11:00 AM Raynelle Dick, NP [facility] None     Plan:   During the course of the visit the patient was educated and counseled about appropriate screening and preventive services including:   Pneumococcal vaccine  Prevnar 13 Influenza vaccine Td vaccine Screening electrocardiogram Bone densitometry screening Colorectal cancer screening Diabetes screening Glaucoma screening Nutrition counseling  Advanced directives: requested    Subjective:    Patient ID: [name], female    DOB: 04-01-1946, 77 y.o.   MRN: [id number]  77 y.o. former smoking (Q96) WF with history of HTN, HLD,history of pre-diabetes (abnormal glucose), overweight, vitamin D def, RA on MTX and plaquenil and vitamin D deficiency presents for follow up.  Left TKA 09/14/23- she has just started physical therapy- she takes Hydrocodone as needed and tylenol for pain. Surgery was done by Dr. Luiz Blare.   Of note she had a work up for syncopal episodes/dizziness in 2014 with negative holter that showed PVC/PAC with Dr. Patty Sermons, normal stress test 2016 and normal MRI in 2012.  She was taken off her HCTZ at that time. She was recently sent to see neurology, had a normal MRI brain, normal EEG and EMG showed neuropathy.  Her last fall was  Jan 2021.  Stress test 2016 Dr. Jens Som Nuclear stress EF: 64%. The study is normal. This is a low risk study. The left ventricular ejection fraction is normal (55-65%). Never had CXR Colonoscopy and EGD in 2014  She is on gabapentin 600mg  1/2-1 tab once a day usually and sometimes does not.  She sees Dr. Nickola Major regularly and states that her RA is well controlled on plaquenil and Methotrexate  She is on fosamax for osteoporosis, stopped and restarted fall/2018. She is scheduling bone density believes it is 11/16/23  she has a diagnosis of insomnia and is currently on melatonin 20mg , advil pm and trazodone 150 mg  reports symptoms are well controlled on current regimen.  she has a diagnosis of GERD with esophagitis and hx of PUD/gastric ulcer for which she continues management by prilosec 40 mg as needed  BMI is Body mass index is 26.26 kg/m., she is working on diet and exercise.Her appetite has been good Wt Readings from Last 3 Encounters:  10/27/23 139 lb (63 kg)  09/21/23 141 lb (64 kg)  09/14/23 134 lb (60.8 kg)   Her blood pressure is NOT Checked at home.  BP Readings from Last 3 Encounters:  10/27/23 118/68  09/21/23 138/80  09/15/23 (!) 141/65  She does not workout.  She denies chest pain, shortness of breath, dizziness.   She is not on cholesterol medication . Her cholesterol is at goal. The cholesterol last visit was:   Lab Results  Component Value Date   CHOL 169 07/25/2023   HDL 63 07/25/2023   LDLCALC 89 07/25/2023   TRIG 81 07/25/2023   CHOLHDL 2.7 07/25/2023   Lab Results  Component Value Date   HGBA1C 5.0 07/25/2023   Patient is on Vitamin D supplement. Lab Results  Component Value Date   VD25OH 78 07/25/2023   She is on thyroid medication. Her medication was NOT changed last visit, 88 mcg 1 pill daily.  Patient denies nervousness, palpitations and weight changes. She is on biotin.  Lab Results  Component Value Date   TSH 2.99 07/25/2023    Blood  pressure 118/68, pulse 65, temperature 97.7 F (36.5 C), height 5\' 1"  (1.549 m), weight 139 lb (63 kg), SpO2 99%.  Medications  Current Outpatient Medications (Endocrine & Metabolic):    alendronate (FOSAMAX) 70 MG tablet, Take 1 tablet once weekly with a full glass of water on an empty stomach for 30 minutes for Osteoporosis.   levothyroxine (SYNTHROID) 88 MCG tablet, Take 88 mcg by mouth daily before breakfast.    Current Outpatient Medications (Analgesics):    aspirin EC 81 MG tablet, Take 1 tablet (81 mg total) by mouth 2 (two) times daily with a meal. Take x 1 month post op to decrease risk of blood clots.   HYDROcodone-acetaminophen (NORCO) 5-325 MG tablet, Take 1-2 tablets by mouth every 6 (six) hours as needed for moderate pain (pain score 4-6).   celecoxib (CELEBREX) 200 MG capsule, Take 1 capsule (200 mg total) by mouth daily. (Patient not taking: Reported on 10/27/2023)  Current Outpatient Medications (Hematological):    folic acid (FOLVITE) 1 MG tablet, Take 1-3 mg by mouth daily.  Current Outpatient Medications (Other):    Ascorbic Acid (VITAMIN C) 1000 MG tablet, Take 1,000 mg by mouth at bedtime.  Cholecalciferol (VITAMIN D3) 125 MCG (5000 UT) CAPS, Take 5,000 Units by mouth 3 (three) times a week.   gabapentin (NEURONTIN) 600 MG tablet, TAKE 1/2 TO 1 TABLET 2 TO 3 TIMES DAILY AS NEEDED FOR PAIN   hydroxychloroquine (PLAQUENIL) 200 MG tablet, Take 200 mg by mouth 2 (two) times daily.   magnesium gluconate (MAGONATE) 500 MG tablet, Take 500 mg by mouth See admin instructions. Take 500 mg by mouth at bedtime on Mon/Wed/Fri   methotrexate 50 MG/2ML injection, Inject 25 mg into the skin every Thursday.   omeprazole (PRILOSEC) 40 MG capsule, TAKE 1 CAPSULE TWICE DAILY TO PREVENT HEARTBURN AND INDIGESTION (Patient taking differently: Take 40 mg by mouth 2 (two) times daily as needed (acid reflux).)   traZODone (DESYREL) 150 MG tablet, TAKE 1 TABLET 1 HOUR BEFORE BEDTIME AS NEEDED  FOR SLEEP   zinc gluconate 50 MG tablet, Take 50 mg by mouth daily. 3 times a week   docusate sodium (COLACE) 100 MG capsule, Take 1 capsule (100 mg total) by mouth 2 (two) times daily. (Patient not taking: Reported on 10/27/2023)   tiZANidine (ZANAFLEX) 2 MG tablet, Take 1 tablet (2 mg total) by mouth every 8 (eight) hours as needed for muscle spasms. (Patient not taking: Reported on 10/27/2023)  Problem list She has Essential hypertension; Rheumatoid arthritis (HCC); Hyperlipidemia, mixed; Gastroesophageal reflux disease without esophagitis; History of colonic polyps; Vitamin D deficiency; Abnormal glucose; Medication management; Hypothyroidism; Osteoporosis; S/P shoulder replacement, right; Hyperparathyroidism (HCC); Overweight (BMI 25.0-29.9); Insomnia; Neuropathy; Aortic atherosclerosis (HCC) by CXR in 2021; Depression, major, single episode, in partial remission (HCC); COVID-19; Hyponatremia; COVID-19 virus infection; Statin myopathy; Closed left hip fracture (HCC); Lumbar polyradiculopathy; Sensorineural hearing loss (SNHL) of both ears; Degenerative lumbar spinal stenosis; Normocytic anemia; Thrombocytopenia (HCC); Acute metabolic encephalopathy; and Primary osteoarthritis of left knee on their problem list.  Allergies Allergies  Allergen Reactions   Tape Other (See Comments)    Paper tape ONLY, please!! Not allergic, sensitive   Ace Inhibitors Cough   Ambien [Zolpidem] Other (See Comments)    "Odd feeling"   Crestor [Rosuvastatin] Other (See Comments)    Sore muscles   Leflunomide Other (See Comments)    Elevated LFTs   Pravastatin Other (See Comments)    Sore muscles   Prozac [Fluoxetine Hcl] Other (See Comments)    Decreased libido   Zoloft [Sertraline Hcl] Other (See Comments)    Reaction not recalled    SURGICAL HISTORY She  has a past surgical history that includes Vaginal hysterectomy; Carpal tunnel release; Cystectomy; AV fistula repair; Bladder suspension; Pilonidal cyst  excision; Trigger finger release; Bunionectomy; ERCP (09/29/2011); Tonsillectomy; Cholecystectomy (09/07/2011); Tenolysis (10/26/2011); Total shoulder replacement (Right, 02/22/2014); Breast cyst excision (Left); Intramedullary (im) nail intertrochanteric (Left, 07/15/2023); and Total knee arthroplasty (Left, 09/14/2023). FAMILY HISTORY Her family history includes Breast cancer in her paternal aunt; Colon cancer in her maternal grandfather; Heart attack in her brother and father; Lung cancer in her mother; Prostate cancer in her paternal grandfather; Stomach cancer in her father. SOCIAL HISTORY She  reports that she quit smoking about 28 years ago. Her smoking use included cigarettes. She has never used smokeless tobacco. She reports that she does not currently use alcohol. She reports that she does not use drugs.  MEDICARE WELLNESS OBJECTIVES: Physical activity:  PT presently for left TKA Cardiac risk factors: Cardiac Risk Factors include: advanced age (>74men, >57 women);sedentary lifestyle;smoking/ tobacco exposure;family history of premature cardiovascular disease Depression/mood screen:      10/27/2023  11:23 AM  Depression screen PHQ 2/9  Decreased Interest 0  Down, Depressed, Hopeless 0  PHQ - 2 Score 0    ADLs:     10/27/2023   11:20 AM 09/21/2023   10:54 PM  In your present state of health, do you have any difficulty performing the following activities:  Hearing? 0 0  Vision? 0 0  Difficulty concentrating or making decisions? 0   Walking or climbing stairs? 1 1  Dressing or bathing? 0 1  Doing errands, shopping? 1 1  Preparing Food and eating ? N   Using the Toilet? N   In the past six months, have you accidently leaked urine? N   Do you have problems with loss of bowel control? N   Managing your Medications? N   Managing your Finances? N   Housekeeping or managing your Housekeeping? N      Cognitive Testing  Alert? Yes  Normal Appearance?Yes  Oriented to person? Yes   Place? Yes   Time? Yes  Recall of three objects?  Yes  Can perform simple calculations? Yes  Displays appropriate judgment?Yes  Can read the correct time from a watch face?Yes  EOL planning: Does Patient Have a Medical Advance Directive?: Yes Type of Advance Directive: Healthcare Power of Attorney, Living will Does patient want to make changes to medical advance directive?: No - Patient declined Copy of Healthcare Power of Attorney in Chart?: No - copy requested     Review of Systems  Constitutional:  Negative for chills, fever and weight loss.  HENT:  Negative for congestion and hearing loss.   Eyes:  Negative for blurred vision and double vision.  Respiratory:  Negative for cough and shortness of breath.   Cardiovascular:  Negative for chest pain, palpitations, orthopnea and leg swelling.  Gastrointestinal:  Negative for abdominal pain, constipation, diarrhea, heartburn, nausea and vomiting.  Musculoskeletal:  Positive for joint pain (left knee). Negative for falls and myalgias.  Skin:  Negative for rash.  Neurological:  Negative for dizziness, tingling, tremors, loss of consciousness and headaches.  Psychiatric/Behavioral:  Negative for depression, memory loss and suicidal ideas.         Objective:   Physical Exam General appearance: alert, no distress, WD/WN,  female HEENT: normocephalic, sclerae anicteric, TMs pearly, nares patent, no discharge or erythema, pharynx normal Oral cavity: MMM, no lesions Neck: supple, no lymphadenopathy, no thyromegaly, no masses Heart: RRR, normal S1, S2, 1/6 systolic murmur RSB Lungs: CTA bilaterally, no wheezes, rhonchi, or rales Abdomen: +bs, soft, nontender, non distended, no masses, no hepatomegaly, no splenomegaly Musculoskeletal:  no swelling, no obvious deformity. Swelling, ain and decreased ROM left knee- currently in brace s/p TKA Extremities: no edema, no cyanosis, no clubbing Pulses: 2+ symmetric, upper and lower extremities,  normal cap refill Neurological: alert, oriented x 3, CN2-12 intact, tremor right thumb at rest, strength normal upper extremities and lower extremities, good finger to nose, DTRs 2+ throughout, decreased sensation bilateral feet and hands, + romberg, gait normal Psychiatric: normal affect, behavior normal, pleasant  Breast: defer Gyn: defer Rectal: defer    Medicare Attestation I have personally reviewed: The patient's medical and social history Their use of alcohol, tobacco or illicit drugs Their current medications and supplements The patient's functional ability including ADLs,fall risks, home safety risks, cognitive, and hearing and visual impairment Diet and physical activities Evidence for depression or mood disorders  The patient's weight, height, BMI, and visual acuity have been recorded in the chart.  I  have made referrals, counseling, and provided education to the patient based on review of the above and I have provided the patient with a written personalized care plan for preventive services.      Raynelle Dick, NP 10/27/23

## 2023-10-27 ENCOUNTER — Encounter: Payer: Self-pay | Admitting: Nurse Practitioner

## 2023-10-27 ENCOUNTER — Ambulatory Visit (INDEPENDENT_AMBULATORY_CARE_PROVIDER_SITE_OTHER): Payer: Medicare PPO | Admitting: Nurse Practitioner

## 2023-10-27 VITALS — BP 118/68 | HR 65 | Temp 97.7°F | Ht 61.0 in | Wt 139.0 lb

## 2023-10-27 DIAGNOSIS — M810 Age-related osteoporosis without current pathological fracture: Secondary | ICD-10-CM

## 2023-10-27 DIAGNOSIS — E782 Mixed hyperlipidemia: Secondary | ICD-10-CM | POA: Diagnosis not present

## 2023-10-27 DIAGNOSIS — I10 Essential (primary) hypertension: Secondary | ICD-10-CM | POA: Diagnosis not present

## 2023-10-27 DIAGNOSIS — E559 Vitamin D deficiency, unspecified: Secondary | ICD-10-CM

## 2023-10-27 DIAGNOSIS — Z0001 Encounter for general adult medical examination with abnormal findings: Secondary | ICD-10-CM

## 2023-10-27 DIAGNOSIS — E213 Hyperparathyroidism, unspecified: Secondary | ICD-10-CM | POA: Diagnosis not present

## 2023-10-27 DIAGNOSIS — E039 Hypothyroidism, unspecified: Secondary | ICD-10-CM | POA: Diagnosis not present

## 2023-10-27 DIAGNOSIS — G720 Drug-induced myopathy: Secondary | ICD-10-CM

## 2023-10-27 DIAGNOSIS — R6889 Other general symptoms and signs: Secondary | ICD-10-CM

## 2023-10-27 DIAGNOSIS — M069 Rheumatoid arthritis, unspecified: Secondary | ICD-10-CM

## 2023-10-27 DIAGNOSIS — I700 Atherosclerosis of aorta: Secondary | ICD-10-CM

## 2023-10-27 DIAGNOSIS — Z87891 Personal history of nicotine dependence: Secondary | ICD-10-CM

## 2023-10-27 DIAGNOSIS — Z79899 Other long term (current) drug therapy: Secondary | ICD-10-CM | POA: Diagnosis not present

## 2023-10-27 DIAGNOSIS — Z96652 Presence of left artificial knee joint: Secondary | ICD-10-CM

## 2023-10-27 DIAGNOSIS — F324 Major depressive disorder, single episode, in partial remission: Secondary | ICD-10-CM

## 2023-10-27 DIAGNOSIS — Z0000 Encounter for general adult medical examination without abnormal findings: Secondary | ICD-10-CM

## 2023-10-27 DIAGNOSIS — K219 Gastro-esophageal reflux disease without esophagitis: Secondary | ICD-10-CM

## 2023-10-27 DIAGNOSIS — R7309 Other abnormal glucose: Secondary | ICD-10-CM

## 2023-10-27 NOTE — Patient Instructions (Signed)

## 2023-10-28 ENCOUNTER — Other Ambulatory Visit: Payer: Self-pay | Admitting: Nurse Practitioner

## 2023-10-28 DIAGNOSIS — E039 Hypothyroidism, unspecified: Secondary | ICD-10-CM

## 2023-10-28 LAB — CBC WITH DIFFERENTIAL/PLATELET
Absolute Lymphocytes: 896 {cells}/uL (ref 850–3900)
Absolute Monocytes: 515 {cells}/uL (ref 200–950)
Basophils Absolute: 39 {cells}/uL (ref 0–200)
Basophils Relative: 0.7 %
Eosinophils Absolute: 179 {cells}/uL (ref 15–500)
Eosinophils Relative: 3.2 %
HCT: 36.4 % (ref 35.0–45.0)
Hemoglobin: 11.4 g/dL — ABNORMAL LOW (ref 11.7–15.5)
MCH: 30 pg (ref 27.0–33.0)
MCHC: 31.3 g/dL — ABNORMAL LOW (ref 32.0–36.0)
MCV: 95.8 fL (ref 80.0–100.0)
MPV: 12.6 fL — ABNORMAL HIGH (ref 7.5–12.5)
Monocytes Relative: 9.2 %
Neutro Abs: 3970 {cells}/uL (ref 1500–7800)
Neutrophils Relative %: 70.9 %
Platelets: 161 10*3/uL (ref 140–400)
RBC: 3.8 10*6/uL (ref 3.80–5.10)
RDW: 14.7 % (ref 11.0–15.0)
Total Lymphocyte: 16 %
WBC: 5.6 10*3/uL (ref 3.8–10.8)

## 2023-10-28 LAB — COMPLETE METABOLIC PANEL WITH GFR
AG Ratio: 2.1 (calc) (ref 1.0–2.5)
ALT: 9 U/L (ref 6–29)
AST: 20 U/L (ref 10–35)
Albumin: 3.7 g/dL (ref 3.6–5.1)
Alkaline phosphatase (APISO): 92 U/L (ref 37–153)
BUN/Creatinine Ratio: 6 (calc) (ref 6–22)
BUN: 5 mg/dL — ABNORMAL LOW (ref 7–25)
CO2: 26 mmol/L (ref 20–32)
Calcium: 10.4 mg/dL (ref 8.6–10.4)
Chloride: 106 mmol/L (ref 98–110)
Creat: 0.79 mg/dL (ref 0.60–1.00)
Globulin: 1.8 g/dL — ABNORMAL LOW (ref 1.9–3.7)
Glucose, Bld: 82 mg/dL (ref 65–99)
Potassium: 4.2 mmol/L (ref 3.5–5.3)
Sodium: 142 mmol/L (ref 135–146)
Total Bilirubin: 0.5 mg/dL (ref 0.2–1.2)
Total Protein: 5.5 g/dL — ABNORMAL LOW (ref 6.1–8.1)
eGFR: 77 mL/min/{1.73_m2} (ref >=60)

## 2023-10-28 LAB — PTH, INTACT AND CALCIUM
Calcium: 10.4 mg/dL (ref 8.6–10.4)
PTH: 64 pg/mL (ref 16–77)

## 2023-10-28 LAB — LIPID PANEL
Cholesterol: 180 mg/dL (ref <200)
HDL: 73 mg/dL (ref >=50)
LDL Cholesterol (Calc): 88 mg/dL
Non-HDL Cholesterol (Calc): 107 mg/dL (ref <130)
Total CHOL/HDL Ratio: 2.5 (calc) (ref <5.0)
Triglycerides: 92 mg/dL (ref <150)

## 2023-10-28 LAB — TSH: TSH: 10.93 m[IU]/L — ABNORMAL HIGH (ref 0.40–4.50)

## 2023-10-28 MED ORDER — LEVOTHYROXINE SODIUM 100 MCG PO TABS: 100.0000 ug | ORAL_TABLET | Freq: Every day | ORAL | 11 refills | Status: DC

## 2023-10-31 DIAGNOSIS — Z96652 Presence of left artificial knee joint: Secondary | ICD-10-CM | POA: Diagnosis not present

## 2023-10-31 DIAGNOSIS — M25662 Stiffness of left knee, not elsewhere classified: Secondary | ICD-10-CM | POA: Diagnosis not present

## 2023-10-31 DIAGNOSIS — M6281 Muscle weakness (generalized): Secondary | ICD-10-CM | POA: Diagnosis not present

## 2023-11-03 DIAGNOSIS — M6281 Muscle weakness (generalized): Secondary | ICD-10-CM | POA: Diagnosis not present

## 2023-11-03 DIAGNOSIS — Z96652 Presence of left artificial knee joint: Secondary | ICD-10-CM | POA: Diagnosis not present

## 2023-11-03 DIAGNOSIS — M25662 Stiffness of left knee, not elsewhere classified: Secondary | ICD-10-CM | POA: Diagnosis not present

## 2023-11-07 DIAGNOSIS — M25552 Pain in left hip: Secondary | ICD-10-CM | POA: Diagnosis not present

## 2023-11-07 DIAGNOSIS — M25662 Stiffness of left knee, not elsewhere classified: Secondary | ICD-10-CM | POA: Diagnosis not present

## 2023-11-07 DIAGNOSIS — M6281 Muscle weakness (generalized): Secondary | ICD-10-CM | POA: Diagnosis not present

## 2023-11-07 DIAGNOSIS — Z96652 Presence of left artificial knee joint: Secondary | ICD-10-CM | POA: Diagnosis not present

## 2023-11-10 DIAGNOSIS — M25662 Stiffness of left knee, not elsewhere classified: Secondary | ICD-10-CM | POA: Diagnosis not present

## 2023-11-10 DIAGNOSIS — M6281 Muscle weakness (generalized): Secondary | ICD-10-CM | POA: Diagnosis not present

## 2023-11-10 DIAGNOSIS — Z96652 Presence of left artificial knee joint: Secondary | ICD-10-CM | POA: Diagnosis not present

## 2023-11-14 DIAGNOSIS — Z96652 Presence of left artificial knee joint: Secondary | ICD-10-CM | POA: Diagnosis not present

## 2023-11-14 DIAGNOSIS — M25662 Stiffness of left knee, not elsewhere classified: Secondary | ICD-10-CM | POA: Diagnosis not present

## 2023-11-14 DIAGNOSIS — M6281 Muscle weakness (generalized): Secondary | ICD-10-CM | POA: Diagnosis not present

## 2023-11-16 DIAGNOSIS — E2839 Other primary ovarian failure: Secondary | ICD-10-CM | POA: Diagnosis not present

## 2023-11-16 DIAGNOSIS — N958 Other specified menopausal and perimenopausal disorders: Secondary | ICD-10-CM | POA: Diagnosis not present

## 2023-11-16 DIAGNOSIS — M25662 Stiffness of left knee, not elsewhere classified: Secondary | ICD-10-CM | POA: Diagnosis not present

## 2023-11-16 DIAGNOSIS — Z96652 Presence of left artificial knee joint: Secondary | ICD-10-CM | POA: Diagnosis not present

## 2023-11-16 DIAGNOSIS — M6281 Muscle weakness (generalized): Secondary | ICD-10-CM | POA: Diagnosis not present

## 2023-11-16 DIAGNOSIS — M8588 Other specified disorders of bone density and structure, other site: Secondary | ICD-10-CM | POA: Diagnosis not present

## 2023-11-17 DIAGNOSIS — M25562 Pain in left knee: Secondary | ICD-10-CM | POA: Diagnosis not present

## 2023-11-21 DIAGNOSIS — M6281 Muscle weakness (generalized): Secondary | ICD-10-CM | POA: Diagnosis not present

## 2023-11-21 DIAGNOSIS — M25662 Stiffness of left knee, not elsewhere classified: Secondary | ICD-10-CM | POA: Diagnosis not present

## 2023-11-21 DIAGNOSIS — Z96652 Presence of left artificial knee joint: Secondary | ICD-10-CM | POA: Diagnosis not present

## 2023-11-24 DIAGNOSIS — M25662 Stiffness of left knee, not elsewhere classified: Secondary | ICD-10-CM | POA: Diagnosis not present

## 2023-11-24 DIAGNOSIS — Z96652 Presence of left artificial knee joint: Secondary | ICD-10-CM | POA: Diagnosis not present

## 2023-11-24 DIAGNOSIS — M6281 Muscle weakness (generalized): Secondary | ICD-10-CM | POA: Diagnosis not present

## 2023-11-29 DIAGNOSIS — M25662 Stiffness of left knee, not elsewhere classified: Secondary | ICD-10-CM | POA: Diagnosis not present

## 2023-11-29 DIAGNOSIS — M6281 Muscle weakness (generalized): Secondary | ICD-10-CM | POA: Diagnosis not present

## 2023-11-29 DIAGNOSIS — Z96652 Presence of left artificial knee joint: Secondary | ICD-10-CM | POA: Diagnosis not present

## 2023-12-01 DIAGNOSIS — M25662 Stiffness of left knee, not elsewhere classified: Secondary | ICD-10-CM | POA: Diagnosis not present

## 2023-12-01 DIAGNOSIS — M6281 Muscle weakness (generalized): Secondary | ICD-10-CM | POA: Diagnosis not present

## 2023-12-01 DIAGNOSIS — Z96652 Presence of left artificial knee joint: Secondary | ICD-10-CM | POA: Diagnosis not present

## 2023-12-06 DIAGNOSIS — M25652 Stiffness of left hip, not elsewhere classified: Secondary | ICD-10-CM | POA: Diagnosis not present

## 2023-12-06 DIAGNOSIS — M6281 Muscle weakness (generalized): Secondary | ICD-10-CM | POA: Diagnosis not present

## 2023-12-07 DIAGNOSIS — Z6823 Body mass index (BMI) 23.0-23.9, adult: Secondary | ICD-10-CM | POA: Diagnosis not present

## 2023-12-07 DIAGNOSIS — M48061 Spinal stenosis, lumbar region without neurogenic claudication: Secondary | ICD-10-CM | POA: Diagnosis not present

## 2023-12-07 DIAGNOSIS — Z79899 Other long term (current) drug therapy: Secondary | ICD-10-CM | POA: Diagnosis not present

## 2023-12-07 DIAGNOSIS — M0609 Rheumatoid arthritis without rheumatoid factor, multiple sites: Secondary | ICD-10-CM | POA: Diagnosis not present

## 2023-12-07 DIAGNOSIS — M1991 Primary osteoarthritis, unspecified site: Secondary | ICD-10-CM | POA: Diagnosis not present

## 2023-12-07 DIAGNOSIS — M810 Age-related osteoporosis without current pathological fracture: Secondary | ICD-10-CM | POA: Diagnosis not present

## 2023-12-08 DIAGNOSIS — Z96652 Presence of left artificial knee joint: Secondary | ICD-10-CM | POA: Diagnosis not present

## 2023-12-08 DIAGNOSIS — M25662 Stiffness of left knee, not elsewhere classified: Secondary | ICD-10-CM | POA: Diagnosis not present

## 2023-12-08 DIAGNOSIS — M6281 Muscle weakness (generalized): Secondary | ICD-10-CM | POA: Diagnosis not present

## 2023-12-12 ENCOUNTER — Telehealth: Payer: Self-pay | Admitting: Nurse Practitioner

## 2023-12-12 DIAGNOSIS — M818 Other osteoporosis without current pathological fracture: Secondary | ICD-10-CM

## 2023-12-12 MED ORDER — ALENDRONATE SODIUM 70 MG PO TABS: ORAL_TABLET | ORAL | 3 refills | Status: DC

## 2023-12-12 NOTE — Telephone Encounter (Signed)
Patient is requesting a refill on Alendronate to [facility] on [facility].

## 2023-12-12 NOTE — Addendum Note (Signed)
Addended by: Dionicio Stall on: 12/12/2023 02:34 PM   Modules accepted: Orders

## 2024-01-16 ENCOUNTER — Ambulatory Visit: Payer: Medicare PPO | Admitting: Student in an Organized Health Care Education/Training Program

## 2024-01-18 ENCOUNTER — Encounter (HOSPITAL_COMMUNITY): Payer: Self-pay

## 2024-01-18 ENCOUNTER — Emergency Department (HOSPITAL_COMMUNITY)

## 2024-01-18 ENCOUNTER — Other Ambulatory Visit: Payer: Self-pay

## 2024-01-18 ENCOUNTER — Inpatient Hospital Stay (HOSPITAL_COMMUNITY)
Admission: EM | Admit: 2024-01-18 | Discharge: 2024-02-07 | DRG: 871 | Disposition: E | Attending: Internal Medicine | Admitting: Internal Medicine

## 2024-01-18 DIAGNOSIS — J189 Pneumonia, unspecified organism: Secondary | ICD-10-CM | POA: Diagnosis present

## 2024-01-18 DIAGNOSIS — I4949 Other premature depolarization: Secondary | ICD-10-CM | POA: Diagnosis present

## 2024-01-18 DIAGNOSIS — Z8249 Family history of ischemic heart disease and other diseases of the circulatory system: Secondary | ICD-10-CM

## 2024-01-18 DIAGNOSIS — R011 Cardiac murmur, unspecified: Secondary | ICD-10-CM | POA: Diagnosis present

## 2024-01-18 DIAGNOSIS — A411 Sepsis due to other specified staphylococcus: Principal | ICD-10-CM | POA: Diagnosis present

## 2024-01-18 DIAGNOSIS — Z66 Do not resuscitate: Secondary | ICD-10-CM | POA: Diagnosis not present

## 2024-01-18 DIAGNOSIS — A419 Sepsis, unspecified organism: Principal | ICD-10-CM

## 2024-01-18 DIAGNOSIS — D65 Disseminated intravascular coagulation [defibrination syndrome]: Secondary | ICD-10-CM | POA: Diagnosis present

## 2024-01-18 DIAGNOSIS — Z79899 Other long term (current) drug therapy: Secondary | ICD-10-CM

## 2024-01-18 DIAGNOSIS — I82451 Acute embolism and thrombosis of right peroneal vein: Secondary | ICD-10-CM | POA: Diagnosis present

## 2024-01-18 DIAGNOSIS — Z7982 Long term (current) use of aspirin: Secondary | ICD-10-CM

## 2024-01-18 DIAGNOSIS — H269 Unspecified cataract: Secondary | ICD-10-CM | POA: Diagnosis present

## 2024-01-18 DIAGNOSIS — Z888 Allergy status to other drugs, medicaments and biological substances status: Secondary | ICD-10-CM

## 2024-01-18 DIAGNOSIS — Z800 Family history of malignant neoplasm of digestive organs: Secondary | ICD-10-CM

## 2024-01-18 DIAGNOSIS — I2699 Other pulmonary embolism without acute cor pulmonale: Secondary | ICD-10-CM

## 2024-01-18 DIAGNOSIS — Z8042 Family history of malignant neoplasm of prostate: Secondary | ICD-10-CM

## 2024-01-18 DIAGNOSIS — B957 Other staphylococcus as the cause of diseases classified elsewhere: Secondary | ICD-10-CM | POA: Diagnosis present

## 2024-01-18 DIAGNOSIS — M179 Osteoarthritis of knee, unspecified: Secondary | ICD-10-CM | POA: Diagnosis present

## 2024-01-18 DIAGNOSIS — D709 Neutropenia, unspecified: Secondary | ICD-10-CM | POA: Diagnosis present

## 2024-01-18 DIAGNOSIS — I82463 Acute embolism and thrombosis of calf muscular vein, bilateral: Secondary | ICD-10-CM | POA: Diagnosis present

## 2024-01-18 DIAGNOSIS — R630 Anorexia: Secondary | ICD-10-CM | POA: Diagnosis present

## 2024-01-18 DIAGNOSIS — Z9889 Other specified postprocedural states: Secondary | ICD-10-CM

## 2024-01-18 DIAGNOSIS — Z6824 Body mass index (BMI) 24.0-24.9, adult: Secondary | ICD-10-CM

## 2024-01-18 DIAGNOSIS — Z8619 Personal history of other infectious and parasitic diseases: Secondary | ICD-10-CM

## 2024-01-18 DIAGNOSIS — Z9089 Acquired absence of other organs: Secondary | ICD-10-CM

## 2024-01-18 DIAGNOSIS — M109 Gout, unspecified: Secondary | ICD-10-CM | POA: Diagnosis present

## 2024-01-18 DIAGNOSIS — D638 Anemia in other chronic diseases classified elsewhere: Secondary | ICD-10-CM | POA: Diagnosis present

## 2024-01-18 DIAGNOSIS — D61818 Other pancytopenia: Secondary | ICD-10-CM

## 2024-01-18 DIAGNOSIS — E039 Hypothyroidism, unspecified: Secondary | ICD-10-CM | POA: Diagnosis present

## 2024-01-18 DIAGNOSIS — D696 Thrombocytopenia, unspecified: Secondary | ICD-10-CM | POA: Diagnosis not present

## 2024-01-18 DIAGNOSIS — I10 Essential (primary) hypertension: Secondary | ICD-10-CM | POA: Diagnosis present

## 2024-01-18 DIAGNOSIS — Z87891 Personal history of nicotine dependence: Secondary | ICD-10-CM

## 2024-01-18 DIAGNOSIS — M170 Bilateral primary osteoarthritis of knee: Secondary | ICD-10-CM | POA: Diagnosis present

## 2024-01-18 DIAGNOSIS — E861 Hypovolemia: Secondary | ICD-10-CM | POA: Diagnosis present

## 2024-01-18 DIAGNOSIS — I5032 Chronic diastolic (congestive) heart failure: Secondary | ICD-10-CM | POA: Diagnosis present

## 2024-01-18 DIAGNOSIS — R17 Unspecified jaundice: Secondary | ICD-10-CM | POA: Diagnosis present

## 2024-01-18 DIAGNOSIS — Z7983 Long term (current) use of bisphosphonates: Secondary | ICD-10-CM

## 2024-01-18 DIAGNOSIS — J1529 Pneumonia due to other staphylococcus: Secondary | ICD-10-CM | POA: Diagnosis present

## 2024-01-18 DIAGNOSIS — Z96652 Presence of left artificial knee joint: Secondary | ICD-10-CM | POA: Diagnosis present

## 2024-01-18 DIAGNOSIS — Z96611 Presence of right artificial shoulder joint: Secondary | ICD-10-CM | POA: Diagnosis present

## 2024-01-18 DIAGNOSIS — I110 Hypertensive heart disease with heart failure: Secondary | ICD-10-CM | POA: Diagnosis present

## 2024-01-18 DIAGNOSIS — Z993 Dependence on wheelchair: Secondary | ICD-10-CM

## 2024-01-18 DIAGNOSIS — K5730 Diverticulosis of large intestine without perforation or abscess without bleeding: Secondary | ICD-10-CM | POA: Diagnosis present

## 2024-01-18 DIAGNOSIS — E876 Hypokalemia: Secondary | ICD-10-CM | POA: Insufficient documentation

## 2024-01-18 DIAGNOSIS — Z91048 Other nonmedicinal substance allergy status: Secondary | ICD-10-CM

## 2024-01-18 DIAGNOSIS — I82433 Acute embolism and thrombosis of popliteal vein, bilateral: Secondary | ICD-10-CM | POA: Diagnosis present

## 2024-01-18 DIAGNOSIS — Z801 Family history of malignant neoplasm of trachea, bronchus and lung: Secondary | ICD-10-CM

## 2024-01-18 DIAGNOSIS — Z8711 Personal history of peptic ulcer disease: Secondary | ICD-10-CM

## 2024-01-18 DIAGNOSIS — R5381 Other malaise: Secondary | ICD-10-CM | POA: Diagnosis present

## 2024-01-18 DIAGNOSIS — Q433 Congenital malformations of intestinal fixation: Secondary | ICD-10-CM

## 2024-01-18 DIAGNOSIS — I493 Ventricular premature depolarization: Secondary | ICD-10-CM | POA: Diagnosis present

## 2024-01-18 DIAGNOSIS — E871 Hypo-osmolality and hyponatremia: Secondary | ICD-10-CM | POA: Diagnosis present

## 2024-01-18 DIAGNOSIS — M810 Age-related osteoporosis without current pathological fracture: Secondary | ICD-10-CM | POA: Diagnosis present

## 2024-01-18 DIAGNOSIS — Z803 Family history of malignant neoplasm of breast: Secondary | ICD-10-CM

## 2024-01-18 DIAGNOSIS — D649 Anemia, unspecified: Secondary | ICD-10-CM | POA: Diagnosis present

## 2024-01-18 DIAGNOSIS — R54 Age-related physical debility: Secondary | ICD-10-CM | POA: Diagnosis present

## 2024-01-18 DIAGNOSIS — R188 Other ascites: Secondary | ICD-10-CM | POA: Diagnosis present

## 2024-01-18 DIAGNOSIS — E785 Hyperlipidemia, unspecified: Secondary | ICD-10-CM | POA: Diagnosis present

## 2024-01-18 DIAGNOSIS — I21A1 Myocardial infarction type 2: Secondary | ICD-10-CM | POA: Diagnosis present

## 2024-01-18 DIAGNOSIS — Z90710 Acquired absence of both cervix and uterus: Secondary | ICD-10-CM

## 2024-01-18 DIAGNOSIS — R64 Cachexia: Secondary | ICD-10-CM | POA: Diagnosis present

## 2024-01-18 DIAGNOSIS — J9601 Acute respiratory failure with hypoxia: Secondary | ICD-10-CM | POA: Diagnosis not present

## 2024-01-18 DIAGNOSIS — Z79631 Long term (current) use of antimetabolite agent: Secondary | ICD-10-CM

## 2024-01-18 DIAGNOSIS — Z79890 Hormone replacement therapy (postmenopausal): Secondary | ICD-10-CM

## 2024-01-18 DIAGNOSIS — R7989 Other specified abnormal findings of blood chemistry: Secondary | ICD-10-CM

## 2024-01-18 DIAGNOSIS — Z96642 Presence of left artificial hip joint: Secondary | ICD-10-CM | POA: Diagnosis present

## 2024-01-18 DIAGNOSIS — M1990 Unspecified osteoarthritis, unspecified site: Secondary | ICD-10-CM | POA: Diagnosis present

## 2024-01-18 DIAGNOSIS — Z1624 Resistance to multiple antibiotics: Secondary | ICD-10-CM | POA: Diagnosis present

## 2024-01-18 DIAGNOSIS — M069 Rheumatoid arthritis, unspecified: Secondary | ICD-10-CM | POA: Diagnosis present

## 2024-01-18 DIAGNOSIS — Z7401 Bed confinement status: Secondary | ICD-10-CM

## 2024-01-18 DIAGNOSIS — Z1152 Encounter for screening for COVID-19: Secondary | ICD-10-CM

## 2024-01-18 DIAGNOSIS — Z515 Encounter for palliative care: Secondary | ICD-10-CM

## 2024-01-18 DIAGNOSIS — Z9049 Acquired absence of other specified parts of digestive tract: Secondary | ICD-10-CM

## 2024-01-18 DIAGNOSIS — I2510 Atherosclerotic heart disease of native coronary artery without angina pectoris: Secondary | ICD-10-CM | POA: Diagnosis present

## 2024-01-18 DIAGNOSIS — K219 Gastro-esophageal reflux disease without esophagitis: Secondary | ICD-10-CM | POA: Diagnosis present

## 2024-01-18 DIAGNOSIS — I2694 Multiple subsegmental pulmonary emboli without acute cor pulmonale: Secondary | ICD-10-CM | POA: Diagnosis present

## 2024-01-18 DIAGNOSIS — R7303 Prediabetes: Secondary | ICD-10-CM | POA: Diagnosis present

## 2024-01-18 DIAGNOSIS — D689 Coagulation defect, unspecified: Secondary | ICD-10-CM

## 2024-01-18 LAB — CBC WITH DIFFERENTIAL/PLATELET
Abs Immature Granulocytes: 0 10*3/uL (ref 0.00–0.07)
Basophils Absolute: 0 10*3/uL (ref 0.0–0.1)
Basophils Relative: 1 %
Eosinophils Absolute: 0.4 10*3/uL (ref 0.0–0.5)
Eosinophils Relative: 41 %
HCT: 25.4 % — ABNORMAL LOW (ref 36.0–46.0)
Hemoglobin: 8.1 g/dL — ABNORMAL LOW (ref 12.0–15.0)
Immature Granulocytes: 0 %
Lymphocytes Relative: 43 %
Lymphs Abs: 0.5 10*3/uL — ABNORMAL LOW (ref 0.7–4.0)
MCH: 29.3 pg (ref 26.0–34.0)
MCHC: 31.9 g/dL (ref 30.0–36.0)
MCV: 92 fL (ref 80.0–100.0)
Monocytes Absolute: 0.1 10*3/uL (ref 0.1–1.0)
Monocytes Relative: 6 %
Neutro Abs: 0.1 10*3/uL — CL (ref 1.7–7.7)
Neutrophils Relative %: 9 %
Platelets: 15 10*3/uL — CL (ref 150–400)
RBC: 2.76 MIL/uL — ABNORMAL LOW (ref 3.87–5.11)
RDW: 15.7 % — ABNORMAL HIGH (ref 11.5–15.5)
WBC: 1.1 10*3/uL — CL (ref 4.0–10.5)
nRBC: 10.5 % — ABNORMAL HIGH (ref 0.0–0.2)

## 2024-01-18 LAB — COMPREHENSIVE METABOLIC PANEL
ALT: 32 U/L (ref 0–44)
AST: 43 U/L — ABNORMAL HIGH (ref 15–41)
Albumin: 2.3 g/dL — ABNORMAL LOW (ref 3.5–5.0)
Alkaline Phosphatase: 119 U/L (ref 38–126)
Anion gap: 12 (ref 5–15)
BUN: 8 mg/dL (ref 8–23)
CO2: 24 mmol/L (ref 22–32)
Calcium: 8.8 mg/dL — ABNORMAL LOW (ref 8.9–10.3)
Chloride: 96 mmol/L — ABNORMAL LOW (ref 98–111)
Creatinine, Ser: 0.79 mg/dL (ref 0.44–1.00)
GFR, Estimated: >60 mL/min (ref >60)
Glucose, Bld: 119 mg/dL — ABNORMAL HIGH (ref 70–99)
Potassium: 3 mmol/L — ABNORMAL LOW (ref 3.5–5.1)
Sodium: 132 mmol/L — ABNORMAL LOW (ref 135–145)
Total Bilirubin: 1.8 mg/dL — ABNORMAL HIGH (ref 0.0–1.2)
Total Protein: 5.1 g/dL — ABNORMAL LOW (ref 6.5–8.1)

## 2024-01-18 LAB — RESP PANEL BY RT-PCR (RSV, FLU A&B, COVID)  RVPGX2
Influenza A by PCR: NEGATIVE
Influenza B by PCR: NEGATIVE
Resp Syncytial Virus by PCR: NEGATIVE
SARS Coronavirus 2 by RT PCR: NEGATIVE

## 2024-01-18 LAB — D-DIMER, QUANTITATIVE: D-Dimer, Quant: 2.19 ug{FEU}/mL — ABNORMAL HIGH (ref 0.00–0.50)

## 2024-01-18 LAB — TROPONIN I (HIGH SENSITIVITY): Troponin I (High Sensitivity): 294 ng/L (ref <18)

## 2024-01-18 MED ORDER — VANCOMYCIN HCL IN DEXTROSE 1-5 GM/200ML-% IV SOLN
1000.0000 mg | Freq: Once | INTRAVENOUS | Status: AC
  Administered 2024-01-19: 1000 mg via INTRAVENOUS
  Filled 2024-01-18: qty 200

## 2024-01-18 MED ORDER — SODIUM CHLORIDE 0.9 % IV SOLN
2.0000 g | Freq: Once | INTRAVENOUS | Status: AC
  Administered 2024-01-19: 2 g via INTRAVENOUS
  Filled 2024-01-18: qty 12.5

## 2024-01-18 NOTE — ED Provider Notes (Signed)
 [place] EMERGENCY DEPARTMENT AT [facility] Provider Note   CSN: [id number] Arrival date & time: 01/18/24  2058     History  Chief Complaint  Patient presents with   Weakness    [name] is a 78 y.o. female.  Patient is a 78 year old female with a history of hypothyroidism, anemia, PUD, RA on methotrexate and Plaquenil, hypertension and hip and knee surgery within the last 6 months who is presenting today from home for multiple issues.  Patient and her husband give the story.  Her husband reports for the last 3 to 4 months patient has had gradually worsening diffuse weakness to where now she is not even able to stand to hold her weight and he is having to have pretty much pick her up to do anything.  However over the last week patient has developed a cough, shortness of breath, occasional vomiting, decreased oral intake.  Her husband has noted swelling in her right leg which she reports has been present since surgery but he feels that this is more recent.  She is also this week been complaining of pain in the right side of her chest which is worse when she coughs or take a deep breath.  She has not had a productive cough or a fever.  The fire department noted that her sats were 88% on room air however EMS reports she has been greater than 90% on room air with them.  She does not use inhalers or oxygen at home.  Her husband did report that her stool has been very dark brown but denies any charcoal black stools.  She has not take any anticoagulation.  Her husband denies any confusion.  She was able to answer orientation questions except reported the wrong year.  The history is provided by the patient and the spouse.  Weakness      Home Medications Prior to Admission medications   Medication Sig Start Date End Date Taking? Authorizing Provider  alendronate (FOSAMAX) 70 MG tablet Take 1 tablet once weekly with a full glass of water on an empty stomach for 30 minutes  for Osteoporosis. 12/12/23 12/11/24  Adela Glimpse, NP  Ascorbic Acid (VITAMIN C) 1000 MG tablet Take 1,000 mg by mouth at bedtime.    [provider]  aspirin EC 81 MG tablet Take 1 tablet (81 mg total) by mouth 2 (two) times daily with a meal. Take x 1 month post op to decrease risk of blood clots. 09/14/23 09/13/24  Marshia Ly, PA-C  Cholecalciferol (VITAMIN D3) 125 MCG (5000 UT) CAPS Take 5,000 Units by mouth 3 (three) times a week.    [provider]  folic acid (FOLVITE) 1 MG tablet Take 1-3 mg by mouth daily.    [provider]  gabapentin (NEURONTIN) 600 MG tablet TAKE 1/2 TO 1 TABLET 2 TO 3 TIMES DAILY AS NEEDED FOR PAIN 07/25/23   Lucky Cowboy, MD  HYDROcodone-acetaminophen (NORCO) 5-325 MG tablet Take 1-2 tablets by mouth every 6 (six) hours as needed for moderate pain (pain score 4-6). 09/14/23   Marshia Ly, PA-C  hydroxychloroquine (PLAQUENIL) 200 MG tablet Take 200 mg by mouth 2 (two) times daily.    [provider]  levothyroxine (SYNTHROID) 100 MCG tablet Take 1 tablet (100 mcg total) by mouth daily. 10/28/23 10/27/24  Raynelle Dick, NP  magnesium gluconate (MAGONATE) 500 MG tablet Take 500 mg by mouth See admin instructions. Take 500 mg by mouth at bedtime on Mon/Wed/Fri  [provider]  methotrexate 50 MG/2ML injection Inject 25 mg into the skin every Thursday. 04/16/21   [provider]  omeprazole (PRILOSEC) 40 MG capsule TAKE 1 CAPSULE TWICE DAILY TO PREVENT HEARTBURN AND INDIGESTION Patient taking differently: Take 40 mg by mouth 2 (two) times daily as needed (acid reflux). 01/13/23   Raynelle Dick, NP  traZODone (DESYREL) 150 MG tablet TAKE 1 TABLET 1 HOUR BEFORE BEDTIME AS NEEDED FOR SLEEP 03/03/22   Raynelle Dick, NP  zinc gluconate 50 MG tablet Take 50 mg by mouth daily. 3 times a week    [provider]      Allergies    Tape, Ace inhibitors, Ambien [zolpidem], Crestor [rosuvastatin],  Leflunomide, Pravastatin, Prozac [fluoxetine hcl], and Zoloft [sertraline hcl]    Review of Systems   Review of Systems  Neurological:  Positive for weakness.    Physical Exam Updated Vital Signs BP (!) 146/84   Pulse (!) 106   Temp 98.6 F (37 C) (Oral)   Resp (!) 28   SpO2 93%  Physical Exam Vitals and nursing note reviewed.  Constitutional:      General: She is not in acute distress.    Appearance: She is well-developed. She is ill-appearing.  HENT:     Head: Normocephalic and atraumatic.  Eyes:     Pupils: Pupils are equal, round, and reactive to light.     Comments: Pale conjunctive a  Cardiovascular:     Rate and Rhythm: Regular rhythm. Tachycardia present. Frequent Extrasystoles are present.    Pulses: Normal pulses.     Heart sounds: Normal heart sounds. No murmur heard.    No friction rub.  Pulmonary:     Effort: Pulmonary effort is normal. Tachypnea present.     Breath sounds: Normal breath sounds. No wheezing or rales.  Abdominal:     General: Bowel sounds are normal. There is no distension.     Palpations: Abdomen is soft.     Tenderness: There is no abdominal tenderness. There is no guarding or rebound.  Musculoskeletal:        General: No tenderness. Normal range of motion.     Right lower leg: Edema present.     Comments: Pitting edema noted to the right lower extremity to the mid shin 1+  Skin:    General: Skin is warm and dry.     Coloration: Skin is pale.     Findings: No rash.  Neurological:     Mental Status: She is alert and oriented to person, place, and time. Mental status is at baseline.     Cranial Nerves: No cranial nerve deficit.     Sensory: No sensory deficit.     Comments: Patient is able to lift the upper and lower extremities to gravity but general weakness  Psychiatric:        Behavior: Behavior normal.     ED Results / Procedures / Treatments   Labs (all labs ordered are listed, but only abnormal results are displayed) Labs  Reviewed  CBC WITH DIFFERENTIAL/PLATELET - Abnormal; Notable for the following components:      Result Value   WBC 1.1 (*)    RBC 2.76 (*)    Hemoglobin 8.1 (*)    HCT 25.4 (*)    RDW 15.7 (*)    Platelets 15 (*)    nRBC 10.5 (*)    Neutro Abs 0.1 (*)    Lymphs Abs 0.5 (*)    All  other components within normal limits  COMPREHENSIVE METABOLIC PANEL - Abnormal; Notable for the following components:   Sodium 132 (*)    Potassium 3.0 (*)    Chloride 96 (*)    Glucose, Bld 119 (*)    Calcium 8.8 (*)    Total Protein 5.1 (*)    Albumin 2.3 (*)    AST 43 (*)    Total Bilirubin 1.8 (*)    All other components within normal limits  D-DIMER, QUANTITATIVE (NOT AT [facility]) - Abnormal; Notable for the following components:   D-Dimer, Quant 2.19 (*)    All other components within normal limits  TROPONIN I (HIGH SENSITIVITY) - Abnormal; Notable for the following components:   Troponin I (High Sensitivity) 294 (*)    All other components within normal limits  RESP PANEL BY RT-PCR (RSV, FLU A&B, COVID)  RVPGX2  CULTURE, BLOOD (ROUTINE X 2)  CULTURE, BLOOD (ROUTINE X 2)  URINALYSIS, W/ REFLEX TO CULTURE (INFECTION SUSPECTED)  DIC (DISSEMINATED INTRAVASCULAR COAGULATION)PANEL  I-STAT CG4 LACTIC ACID, ED  TROPONIN I (HIGH SENSITIVITY)    EKG EKG Interpretation Date/Time:  Wednesday January 18 2024 21:13:03 EDT Ventricular Rate:  102 PR Interval:  148 QRS Duration:  95 QT Interval:  303 QTC Calculation: 395 R Axis:   91  Text Interpretation: Sinus tachycardia Ventricular premature complex Probable left atrial enlargement Consider right ventricular hypertrophy No significant change since last tracing Confirmed by Gwyneth Sprout ([id number]) on 01/18/2024 10:08:05 PM  Radiology DG Chest Port 1 View Result Date: 01/18/2024 CLINICAL DATA:  Shortness of breath. EXAM: PORTABLE CHEST 1 VIEW COMPARISON:  07/06/2023 FINDINGS: The heart is enlarged. Diffuse interstitial opacities throughout both lungs  with more patchy opacity at the lung bases. No pneumothorax. There may be a right pleural effusion. IMPRESSION: Cardiomegaly with diffuse interstitial opacities and more patchy opacity at the lung bases, may represent pulmonary edema, multifocal pneumonia, or combination there. Possible right pleural effusion. Electronically Signed   By: Narda Rutherford M.D.   On: 01/18/2024 23:08    Procedures Procedures    Medications Ordered in ED Medications  vancomycin (VANCOCIN) IVPB 1000 mg/200 mL premix (has no administration in time range)  ceFEPIme (MAXIPIME) 2 g in sodium chloride 0.9 % 100 mL IVPB (has no administration in time range)    ED Course/ Medical Decision Making/ A&P                                 Medical Decision Making Amount and/or Complexity of Data Reviewed Labs: ordered. Radiology: ordered.   Pt with multiple medical problems and comorbidities and presenting today with a complaint that caries a high risk for morbidity and mortality.  Here today with multiple complaints but concern for anemia, PE, ACS, AKI and electrolyte abnormalities, infectious etiology.  Husband is concern for possible Guillain-Barr given her gradual weakness over the last few months and he has a brother who had similar symptoms and had Guillain-Barr.  Suspect deconditioning from multiple surgeries and higher concern for above etiologies. I independently interpreted patient's labs and EKG.  EKG with sinus tachycardia but no other acute findings.  Viral panel is negative, troponin is elevated today at 294, CMP with hypokalemia of 3.0 with normal creatinine and anion gap.  AST is slightly elevated at 43 and total bilirubin is mildly elevated at 1.8, CBC today with pancytopenia with a white blood cell count of 1.1 with less than 100  absolute neutrophils, platelet count of 15 and a hemoglobin of 8.1 from prior hemoglobin of 11.  I have independently visualized and interpreted pt's images today.  Chest x-ray  showing signs of possible pneumonia however radiology reports also possibility of pulmonary edema versus multifocal pneumonia.  Given patient's CBC results she is not on any type of chemotherapy at this time but does take methotrexate and Plaquenil.  Will do a CTA to rule out PE.  DIC panel was sent.  Pt also could have possible TTP.         Final Clinical Impression(s) / ED Diagnoses Final diagnoses:  None    Rx / DC Orders ED Discharge Orders     None         Gwyneth Sprout, MD 01/18/24 2354

## 2024-01-18 NOTE — ED Notes (Signed)
 Provided and placed warm blankets patient at this time. Family at bedside.

## 2024-01-18 NOTE — ED Notes (Signed)
 Date and time results received: 01/18/24 11:17 PM  (use smartphrase ".now" to insert current time)  Test: Troponin Critical Value: 294  Name of Provider Notified: Anitra Lauth, W  Orders Received? Or Actions Taken?: Orders Received - See Orders for details

## 2024-01-18 NOTE — ED Notes (Signed)
 IV attempt x2.

## 2024-01-18 NOTE — ED Triage Notes (Signed)
 Pt BIBA from home, c/o weakness, SOB, cough, general malaise, dark stools. Started 5-7 days ago.  Pts surgeon prescribed prednisone. Has a follow up PCP tomorrow. A/Ox4

## 2024-01-19 ENCOUNTER — Inpatient Hospital Stay (HOSPITAL_COMMUNITY)

## 2024-01-19 ENCOUNTER — Emergency Department (HOSPITAL_COMMUNITY)

## 2024-01-19 ENCOUNTER — Ambulatory Visit: Admitting: Student in an Organized Health Care Education/Training Program

## 2024-01-19 ENCOUNTER — Other Ambulatory Visit: Payer: Self-pay | Admitting: Hematology and Oncology

## 2024-01-19 ENCOUNTER — Encounter (HOSPITAL_COMMUNITY): Payer: Self-pay

## 2024-01-19 DIAGNOSIS — D696 Thrombocytopenia, unspecified: Secondary | ICD-10-CM | POA: Diagnosis present

## 2024-01-19 DIAGNOSIS — D61818 Other pancytopenia: Secondary | ICD-10-CM

## 2024-01-19 DIAGNOSIS — I110 Hypertensive heart disease with heart failure: Secondary | ICD-10-CM | POA: Diagnosis present

## 2024-01-19 DIAGNOSIS — E876 Hypokalemia: Secondary | ICD-10-CM | POA: Insufficient documentation

## 2024-01-19 DIAGNOSIS — A419 Sepsis, unspecified organism: Secondary | ICD-10-CM

## 2024-01-19 DIAGNOSIS — I2694 Multiple subsegmental pulmonary emboli without acute cor pulmonale: Secondary | ICD-10-CM | POA: Diagnosis present

## 2024-01-19 DIAGNOSIS — Z1152 Encounter for screening for COVID-19: Secondary | ICD-10-CM | POA: Diagnosis not present

## 2024-01-19 DIAGNOSIS — Z8739 Personal history of other diseases of the musculoskeletal system and connective tissue: Secondary | ICD-10-CM

## 2024-01-19 DIAGNOSIS — M069 Rheumatoid arthritis, unspecified: Secondary | ICD-10-CM | POA: Diagnosis present

## 2024-01-19 DIAGNOSIS — R188 Other ascites: Secondary | ICD-10-CM | POA: Diagnosis present

## 2024-01-19 DIAGNOSIS — I5032 Chronic diastolic (congestive) heart failure: Secondary | ICD-10-CM | POA: Diagnosis present

## 2024-01-19 DIAGNOSIS — I2699 Other pulmonary embolism without acute cor pulmonale: Secondary | ICD-10-CM

## 2024-01-19 DIAGNOSIS — I82451 Acute embolism and thrombosis of right peroneal vein: Secondary | ICD-10-CM | POA: Diagnosis present

## 2024-01-19 DIAGNOSIS — D689 Coagulation defect, unspecified: Secondary | ICD-10-CM

## 2024-01-19 DIAGNOSIS — R64 Cachexia: Secondary | ICD-10-CM | POA: Diagnosis present

## 2024-01-19 DIAGNOSIS — I2690 Septic pulmonary embolism without acute cor pulmonale: Secondary | ICD-10-CM

## 2024-01-19 DIAGNOSIS — I10 Essential (primary) hypertension: Secondary | ICD-10-CM

## 2024-01-19 DIAGNOSIS — E871 Hypo-osmolality and hyponatremia: Secondary | ICD-10-CM | POA: Diagnosis present

## 2024-01-19 DIAGNOSIS — I82433 Acute embolism and thrombosis of popliteal vein, bilateral: Secondary | ICD-10-CM | POA: Diagnosis present

## 2024-01-19 DIAGNOSIS — R7989 Other specified abnormal findings of blood chemistry: Secondary | ICD-10-CM

## 2024-01-19 DIAGNOSIS — Z66 Do not resuscitate: Secondary | ICD-10-CM | POA: Diagnosis not present

## 2024-01-19 DIAGNOSIS — E039 Hypothyroidism, unspecified: Secondary | ICD-10-CM | POA: Diagnosis present

## 2024-01-19 DIAGNOSIS — J189 Pneumonia, unspecified organism: Secondary | ICD-10-CM | POA: Diagnosis present

## 2024-01-19 DIAGNOSIS — Q433 Congenital malformations of intestinal fixation: Secondary | ICD-10-CM | POA: Diagnosis not present

## 2024-01-19 DIAGNOSIS — D638 Anemia in other chronic diseases classified elsewhere: Secondary | ICD-10-CM | POA: Diagnosis present

## 2024-01-19 DIAGNOSIS — J9601 Acute respiratory failure with hypoxia: Secondary | ICD-10-CM | POA: Diagnosis not present

## 2024-01-19 DIAGNOSIS — D709 Neutropenia, unspecified: Secondary | ICD-10-CM | POA: Diagnosis present

## 2024-01-19 DIAGNOSIS — M7989 Other specified soft tissue disorders: Secondary | ICD-10-CM

## 2024-01-19 DIAGNOSIS — D65 Disseminated intravascular coagulation [defibrination syndrome]: Secondary | ICD-10-CM | POA: Diagnosis present

## 2024-01-19 DIAGNOSIS — E038 Other specified hypothyroidism: Secondary | ICD-10-CM

## 2024-01-19 DIAGNOSIS — D649 Anemia, unspecified: Secondary | ICD-10-CM

## 2024-01-19 DIAGNOSIS — Z515 Encounter for palliative care: Secondary | ICD-10-CM | POA: Diagnosis not present

## 2024-01-19 DIAGNOSIS — J1529 Pneumonia due to other staphylococcus: Secondary | ICD-10-CM | POA: Diagnosis present

## 2024-01-19 DIAGNOSIS — I21A1 Myocardial infarction type 2: Secondary | ICD-10-CM | POA: Diagnosis present

## 2024-01-19 DIAGNOSIS — A411 Sepsis due to other specified staphylococcus: Secondary | ICD-10-CM | POA: Diagnosis present

## 2024-01-19 LAB — BLOOD CULTURE ID PANEL (REFLEXED) - BCID2

## 2024-01-19 LAB — CBC
HCT: 19.6 % — ABNORMAL LOW (ref 36.0–46.0)
Hemoglobin: 6.4 g/dL — CL (ref 12.0–15.0)
MCH: 29.8 pg (ref 26.0–34.0)
MCHC: 32.7 g/dL (ref 30.0–36.0)
MCV: 91.2 fL (ref 80.0–100.0)
Platelets: 41 10*3/uL — ABNORMAL LOW (ref 150–400)
RBC: 2.15 MIL/uL — ABNORMAL LOW (ref 3.87–5.11)
RDW: 16.7 % — ABNORMAL HIGH (ref 11.5–15.5)
WBC: 0.7 10*3/uL — CL (ref 4.0–10.5)
nRBC: 16.4 % — ABNORMAL HIGH (ref 0.0–0.2)

## 2024-01-19 LAB — RESPIRATORY PANEL BY PCR

## 2024-01-19 LAB — DIC (DISSEMINATED INTRAVASCULAR COAGULATION)PANEL
D-Dimer, Quant: 2.32 ug{FEU}/mL — ABNORMAL HIGH (ref 0.00–0.50)
D-Dimer, Quant: 6.3 ug{FEU}/mL — ABNORMAL HIGH (ref 0.00–0.50)
Fibrinogen: 493 mg/dL — ABNORMAL HIGH (ref 210–475)
Fibrinogen: 503 mg/dL — ABNORMAL HIGH (ref 210–475)
INR: 1.1 (ref 0.8–1.2)
INR: 1.1 (ref 0.8–1.2)
Platelets: 24 10*3/uL — CL (ref 150–400)
Platelets: 30 10*3/uL — ABNORMAL LOW (ref 150–400)
Prothrombin Time: 14.5 s (ref 11.4–15.2)
Prothrombin Time: 14.6 s (ref 11.4–15.2)
Smear Review: NONE SEEN
Smear Review: NONE SEEN
aPTT: 34 s (ref 24–36)
aPTT: 34 s (ref 24–36)

## 2024-01-19 LAB — COMPREHENSIVE METABOLIC PANEL
ALT: 26 U/L (ref 0–44)
AST: 28 U/L (ref 15–41)
Albumin: 1.9 g/dL — ABNORMAL LOW (ref 3.5–5.0)
Alkaline Phosphatase: 88 U/L (ref 38–126)
Anion gap: 7 (ref 5–15)
BUN: 8 mg/dL (ref 8–23)
CO2: 25 mmol/L (ref 22–32)
Calcium: 8.3 mg/dL — ABNORMAL LOW (ref 8.9–10.3)
Chloride: 97 mmol/L — ABNORMAL LOW (ref 98–111)
Creatinine, Ser: 0.76 mg/dL (ref 0.44–1.00)
GFR, Estimated: >60 mL/min (ref >60)
Glucose, Bld: 80 mg/dL (ref 70–99)
Potassium: 4 mmol/L (ref 3.5–5.1)
Sodium: 129 mmol/L — ABNORMAL LOW (ref 135–145)
Total Bilirubin: 1.9 mg/dL — ABNORMAL HIGH (ref 0.0–1.2)
Total Protein: 4.4 g/dL — ABNORMAL LOW (ref 6.5–8.1)

## 2024-01-19 LAB — RETICULOCYTES
Immature Retic Fract: 43.2 % — ABNORMAL HIGH (ref 2.3–15.9)
RBC.: 2.16 MIL/uL — ABNORMAL LOW (ref 3.87–5.11)
Retic Count, Absolute: 102 10*3/uL (ref 19.0–186.0)
Retic Ct Pct: 4.7 % — ABNORMAL HIGH (ref 0.4–3.1)

## 2024-01-19 LAB — TROPONIN I (HIGH SENSITIVITY)
Troponin I (High Sensitivity): 357 ng/L (ref <18)
Troponin I (High Sensitivity): 364 ng/L (ref <18)
Troponin I (High Sensitivity): 687 ng/L (ref <18)

## 2024-01-19 LAB — PREPARE RBC (CROSSMATCH)

## 2024-01-19 LAB — MRSA NEXT GEN BY PCR, NASAL: MRSA by PCR Next Gen: NOT DETECTED

## 2024-01-19 LAB — I-STAT CG4 LACTIC ACID, ED: Lactic Acid, Venous: 1.7 mmol/L (ref 0.5–1.9)

## 2024-01-19 LAB — APTT: aPTT: 37 s — ABNORMAL HIGH (ref 24–36)

## 2024-01-19 LAB — PROTIME-INR
INR: 1.2 (ref 0.8–1.2)
Prothrombin Time: 15.2 s (ref 11.4–15.2)

## 2024-01-19 MED ORDER — LACTATED RINGERS IV SOLN: INTRAVENOUS | Status: DC

## 2024-01-19 MED ORDER — POTASSIUM CHLORIDE 10 MEQ/100ML IV SOLN
10.0000 meq | INTRAVENOUS | Status: DC
Start: 2024-01-19 — End: 2024-01-19
  Administered 2024-01-19: 10 meq via INTRAVENOUS
  Filled 2024-01-19: qty 100

## 2024-01-19 MED ORDER — SODIUM CHLORIDE 0.9% IV SOLUTION: Freq: Once | INTRAVENOUS | Status: DC

## 2024-01-19 MED ORDER — IOHEXOL 350 MG/ML SOLN
100.0000 mL | Freq: Once | INTRAVENOUS | Status: AC | PRN
  Administered 2024-01-19: 100 mL via INTRAVENOUS

## 2024-01-19 MED ORDER — GUAIFENESIN ER 600 MG PO TB12
600.0000 mg | ORAL_TABLET | Freq: Two times a day (BID) | ORAL | Status: DC
  Administered 2024-01-19 (×3): 600 mg via ORAL
  Filled 2024-01-19 (×3): qty 1

## 2024-01-19 MED ORDER — PANTOPRAZOLE SODIUM 40 MG IV SOLR
40.0000 mg | Freq: Two times a day (BID) | INTRAVENOUS | Status: DC
  Administered 2024-01-19 – 2024-01-20 (×4): 40 mg via INTRAVENOUS
  Filled 2024-01-19 (×4): qty 10

## 2024-01-19 MED ORDER — ACETAMINOPHEN 650 MG RE SUPP: 650.0000 mg | Freq: Four times a day (QID) | RECTAL | Status: DC | PRN

## 2024-01-19 MED ORDER — ONDANSETRON HCL 4 MG PO TABS: 4.0000 mg | ORAL_TABLET | Freq: Four times a day (QID) | ORAL | Status: DC | PRN

## 2024-01-19 MED ORDER — SODIUM CHLORIDE 0.9 % IV SOLN
500.0000 mg | Freq: Every day | INTRAVENOUS | Status: DC
  Administered 2024-01-20: 500 mg via INTRAVENOUS
  Filled 2024-01-19: qty 5

## 2024-01-19 MED ORDER — VANCOMYCIN HCL 750 MG/150ML IV SOLN: 750.0000 mg | INTRAVENOUS | Status: DC

## 2024-01-19 MED ORDER — POTASSIUM CHLORIDE 10 MEQ/100ML IV SOLN
10.0000 meq | INTRAVENOUS | Status: AC
  Administered 2024-01-19 (×2): 10 meq via INTRAVENOUS
  Filled 2024-01-19 (×2): qty 100

## 2024-01-19 MED ORDER — FOLIC ACID 1 MG PO TABS: 1.0000 mg | ORAL_TABLET | Freq: Every day | ORAL | Status: DC

## 2024-01-19 MED ORDER — SODIUM CHLORIDE 0.9 % IV SOLN: 250.0000 mL | INTRAVENOUS | Status: AC | PRN

## 2024-01-19 MED ORDER — SODIUM CHLORIDE 0.9% FLUSH
3.0000 mL | Freq: Two times a day (BID) | INTRAVENOUS | Status: DC
  Administered 2024-01-19 (×3): 3 mL via INTRAVENOUS

## 2024-01-19 MED ORDER — SODIUM CHLORIDE 0.9 % IV SOLN: 2.0000 g | Freq: Two times a day (BID) | INTRAVENOUS | Status: DC

## 2024-01-19 MED ORDER — SODIUM CHLORIDE 0.9 % IV SOLN: INTRAVENOUS | Status: DC

## 2024-01-19 MED ORDER — LEVOTHYROXINE SODIUM 100 MCG PO TABS
100.0000 ug | ORAL_TABLET | Freq: Every day | ORAL | Status: DC
  Filled 2024-01-19: qty 1

## 2024-01-19 MED ORDER — POTASSIUM CHLORIDE CRYS ER 20 MEQ PO TBCR
40.0000 meq | EXTENDED_RELEASE_TABLET | ORAL | Status: AC
  Administered 2024-01-19: 40 meq via ORAL
  Filled 2024-01-19: qty 4

## 2024-01-19 MED ORDER — CHLORHEXIDINE GLUCONATE CLOTH 2 % EX PADS
6.0000 | MEDICATED_PAD | Freq: Every day | CUTANEOUS | Status: DC
  Administered 2024-01-19: 6 via TOPICAL

## 2024-01-19 MED ORDER — PANTOPRAZOLE SODIUM 40 MG PO TBEC: 40.0000 mg | DELAYED_RELEASE_TABLET | Freq: Every day | ORAL | Status: DC

## 2024-01-19 MED ORDER — HYDROXYCHLOROQUINE SULFATE 200 MG PO TABS: 200.0000 mg | ORAL_TABLET | Freq: Two times a day (BID) | ORAL | Status: DC

## 2024-01-19 MED ORDER — SODIUM CHLORIDE 0.9 % IV SOLN: 2.0000 g | INTRAVENOUS | Status: DC

## 2024-01-19 MED ORDER — SODIUM CHLORIDE 0.9% FLUSH: 3.0000 mL | INTRAVENOUS | Status: DC | PRN

## 2024-01-19 MED ORDER — ACETAMINOPHEN 325 MG PO TABS: 650.0000 mg | ORAL_TABLET | Freq: Four times a day (QID) | ORAL | Status: DC | PRN

## 2024-01-19 MED ORDER — SODIUM CHLORIDE 0.9% IV SOLUTION: Freq: Once | INTRAVENOUS | Status: AC

## 2024-01-19 MED ORDER — ONDANSETRON HCL 4 MG/2ML IJ SOLN: 4.0000 mg | Freq: Four times a day (QID) | INTRAMUSCULAR | Status: DC | PRN

## 2024-01-19 MED ORDER — SODIUM CHLORIDE (PF) 0.9 % IJ SOLN
INTRAMUSCULAR | Status: AC
  Filled 2024-01-19: qty 50

## 2024-01-19 MED ORDER — MEDIHONEY WOUND/BURN DRESSING EX PSTE
1.0000 | PASTE | Freq: Every day | CUTANEOUS | Status: DC
  Administered 2024-01-19 – 2024-01-20 (×2): 1 via TOPICAL
  Filled 2024-01-19: qty 44

## 2024-01-19 MED ORDER — HEPARIN (PORCINE) 25000 UT/250ML-% IV SOLN
1000.0000 [IU]/h | INTRAVENOUS | Status: DC
  Administered 2024-01-19: 1000 [IU]/h via INTRAVENOUS
  Filled 2024-01-19: qty 250

## 2024-01-19 MED ORDER — FOLIC ACID 1 MG PO TABS
1.0000 mg | ORAL_TABLET | Freq: Three times a day (TID) | ORAL | Status: DC
  Administered 2024-01-19: 1 mg via ORAL
  Filled 2024-01-19: qty 1

## 2024-01-19 MED ORDER — LEVALBUTEROL HCL 0.63 MG/3ML IN NEBU
0.6300 mg | INHALATION_SOLUTION | Freq: Four times a day (QID) | RESPIRATORY_TRACT | Status: DC | PRN
  Administered 2024-01-20: 0.63 mg via RESPIRATORY_TRACT
  Filled 2024-01-19: qty 3

## 2024-01-19 NOTE — Progress Notes (Signed)
   NAME:  [name], MRN:  [id number], DOB:  [birth year], LOS: 0 ADMISSION DATE:  01/18/2024, CONSULTATION DATE: 01/19/2024 REFERRING MD: Dr. [place], CHIEF COMPLAINT: Pulmonary embolism  History of Present Illness:  Was brought into the hospital with progressive weakness, shortness of breath generalized malaise Gradually worsening overall health status over the last 3 to 4 months Cough, shortness of breath the last few days Lower extremity swelling,  Evaluation did reveal pulmonary embolism, no evidence of right heart strain, moderate embolic burden Consolidation of the bases of the lungs worse on the right  Pertinent  Medical History   Past Medical History:  Diagnosis Date   Anemia    Hx of    Arthritis    Calculus of bile duct without mention of cholecystitis or obstruction    Cataract    GERD (gastroesophageal reflux disease)    H. pylori infection    Hx of    Heart murmur    Hyperlipidemia    Hypertension    Hypothyroid    PUD (peptic ulcer disease)    Rheumatoid arthritis(714.0)    Significant Hospital Events: Including procedures, antibiotic start and stop dates in addition to other pertinent events   3/13 CT chest-PE with moderate clot burden, CT reviewed  Interim History / Subjective:  Elderly, frail Denies pain or discomfort  Objective   Blood pressure (!) 140/105, pulse (!) 119, temperature 98.9 F (37.2 C), temperature source Oral, resp. rate (!) 24, SpO2 93%.        Intake/Output Summary (Last 24 hours) at 01/19/2024 1218 Last data filed at 01/19/2024 4098 Gross per 24 hour  Intake 294.12 ml  Output --  Net 294.12 ml   There were no vitals filed for this visit.  Examination: General: Appears pale, chronically ill-appearing elderly HENT: Dry oral mucosa Lungs: Decreased air entry bilaterally with rales at the bases Cardiovascular: S1-S2 appreciated Abdomen: Soft, bowel sounds appreciated Extremities: No clubbing, no edema Neuro: Easily  arousable, Follows commands GU:   Resolved Hospital Problem list     Assessment & Plan:  Community-acquired pneumonia -Azithromycin, ceftriaxone -Follow cultures -MRSA PCR negative -Respiratory viral panel pending  Pancytopenia -Concerned with pancytopenia being related to methotrexate and Plaquenil as outpatient -Transfusion pending -Consult hematology  Acute pulmonary embolism with pulmonary infarction Bilateral lower extremity DVT -Continue anticoagulation-heparin anticoagulation per protocol -Will transfuse -Transfuse platelets -Continue supplemental oxygen for hypoxemic respiratory failure  Demand ischemia -Continue to trend -Monitor telemetry  Electrolyte derangement -Replete  Hypertension -Not on any medications recently  Hypothyroidism -Continue home Synthroid  History of rheumatoid arthritis  Recent hip arthroplasty  Virl Diamond, MD [place] [facility] Pager: See Loretha Stapler

## 2024-01-19 NOTE — Progress Notes (Signed)
 Bilateral lower extremity venous duplex has been completed.  Results can be found in chart review under CV Proc.  01/19/2024 10:08 AM  Chelcea Zahn Durenda Age, RVT.

## 2024-01-19 NOTE — H&P (Addendum)
 History and Physical    [name] RUE:[id number] DOB: 1946-06-16 DOA: 01/18/2024  PCP: Lucky Cowboy, MD   Patient coming from: Home   Chief Complaint:  Chief Complaint  Patient presents with   Weakness   ED TRIAGE note:  Pt BIBA from home, c/o weakness, SOB, cough, general malaise, dark stools. Started 5-7 days ago.  Pts surgeon prescribed prednisone. Has a follow up PCP tomorrow. A/Ox4       HPI:  [name] is a 78 y.o. female with medical history significant of bilateral osteoarthritis of the knee status post knee replacement surgery 2024, arthritis, osteoporosis, anemia of chronic disease, gout, hyperlipidemia,  hypothyroidism, essential hypertension, anemia of chronic disease and thrombocytopenia presented to emergency department with complaining of multiple complaints/issues.   Patient and her husband give the story.  Her husband reports for the last 3 to 4 months patient has had gradually worsening diffuse weakness to where now she is not even able to stand to hold her weight and he is having to have pretty much pick her up to do anything.  However over the last week patient has developed a cough, shortness of breath, occasional vomiting, decreased oral intake.  Her husband has noted swelling in her right leg which she reports has been present since surgery but he feels that this is more recent.  She is also this week been complaining of pain in the right side of her chest which is worse when she coughs or take a deep breath.  She has not had a productive cough or a fever.  The fire department noted that her sats were 88% on room air however EMS reports she has been greater than 90% on room air with them.  She does not use inhalers or oxygen at home.  Patient's husband at bedside reported that dark-colored stool but denies any noticing fresh blood per rectum.  She has not take any anticoagulation.  Her husband denies any confusion.  She was able to answer orientation  questions except reported the wrong year.  Patient is complaining about productive cough and right-sided chest pressure.  Denies any fever, chill, nausea, vomiting, abdominal pain, hematemesis and melena.    ED Course:  Presentation to ED patient found tachycardic heart rate 102, tachypneic 28, elevated blood pressure 150/89 and O2 sat 100% room air. Normal lactic acid level 1.7. Blood cultures are in process. DIC panel showing increased fibrinogen 493, elevated D-dimer 2.32, low platelet 24. Elevated troponin 293, CMP showing low sodium 132, low potassium 3.2, low chloride 96, normal creatinine 0.79, low albumin 2.3, elevated AST 43 and elevated total bilirubin 1.8. CBC showing low WBC count 1.1 low hemoglobin 8.1 (baseline hemoglobin around 11), low platelet 15, decreased absolute neutrophil count less than 0.1 Respiratory panel negative for COVID, flu and RSV.  EKG showing premature ventricular complex and sinus tachycardia heart rate 102.  Chest x-ray showing cardiomegaly with diffuse interstitial opacity multifocal pneumonia and right pleural effusion.  Pending CT head, CT angiogram of the chest, and CT abdomen pelvis.  In the patient has been given IV vancomycin and cefepime.  Hospitalist has been consulted for further evaluation management of sepsis secondary to multifocal pneumonia, consumptive coagulopathy leukopenia, thrombocytopenia acute on chronic anemia, hyponatremia, hypokalemia, demand ischemia and possible GI bleed.  Significant labs in the ED: Lab Orders         Resp panel by RT-PCR (RSV, Flu A&B, Covid) Anterior Nasal Swab  Blood culture (routine x 2)         Expectorated Sputum Assessment w Gram Stain, Rflx to Resp Cult         Respiratory (~20 pathogens) panel by PCR         MRSA Next Gen by PCR, Nasal         CBC with Differential/Platelet         Comprehensive metabolic panel         Urinalysis, w/ Reflex to Culture (Infection Suspected) -Urine, Clean  Catch         D-dimer, quantitative         DIC Panel ONCE - STAT         Comprehensive metabolic panel         CBC         Protime-INR         APTT         DIC Panel Tomorrow AM 0500         Legionella Pneumophila Serogp 1 Ur Ag         Strep pneumoniae urinary antigen         CBC         Occult blood card to lab, stool       Review of Systems:  Review of Systems  Constitutional:  Positive for malaise/fatigue. Negative for chills, fever and weight loss.  Respiratory:  Positive for cough and sputum production. Negative for shortness of breath and wheezing.   Cardiovascular:  Positive for chest pain and leg swelling.  Gastrointestinal:  Negative for abdominal pain, blood in stool, constipation, diarrhea, heartburn, melena, nausea and vomiting.  Musculoskeletal:  Negative for back pain, myalgias and neck pain.  Neurological:  Negative for dizziness and headaches.  Psychiatric/Behavioral:  The patient is not nervous/anxious.     Past Medical History:  Diagnosis Date   Anemia    Hx of    Arthritis    Calculus of bile duct without mention of cholecystitis or obstruction    Cataract    GERD (gastroesophageal reflux disease)    H. pylori infection    Hx of    Heart murmur    Hyperlipidemia    Hypertension    Hypothyroid    PUD (peptic ulcer disease)    Rheumatoid arthritis(714.0)     Past Surgical History:  Procedure Laterality Date   AV FISTULA REPAIR     BLADDER SUSPENSION     BREAST CYST EXCISION Left    BUNIONECTOMY     CARPAL TUNNEL RELEASE     CHOLECYSTECTOMY  09/07/2011   CYSTECTOMY     left breast   ERCP  09/29/2011   Procedure: ENDOSCOPIC RETROGRADE CHOLANGIOPANCREATOGRAPHY (ERCP);  Surgeon: Louis Meckel, MD;  Location: [facility];  Service: Endoscopy;  Laterality: N/A;   INTRAMEDULLARY (IM) NAIL INTERTROCHANTERIC Left 07/15/2023   Procedure: INTRAMEDULLARY (IM) NAIL INTERTROCHANTERIC;  Surgeon: Luci Bank, MD;  Location: [facility];  Service:  Orthopedics;  Laterality: Left;   PILONIDAL CYST EXCISION     TENOLYSIS  10/26/2011   Procedure: TENDON SHEATH RELEASE/TENOLYSIS;  Surgeon: Nicki Reaper, MD;  Location:  [facility];  Service: Orthopedics;  Laterality: Right;  tenosynovectomy removal superficialis slip right index finger   TONSILLECTOMY     TOTAL KNEE ARTHROPLASTY Left 09/14/2023   Procedure: TOTAL KNEE ARTHROPLASTY;  Surgeon: [name], MD;  Location: [facility];  Service: Orthopedics;  Laterality: Left;   TOTAL SHOULDER REPLACEMENT Right 02/22/2014  Dr. Malka So at [facility] RELEASE     VAGINAL HYSTERECTOMY     ovaries not removed     reports that she quit smoking about 28 years ago. Her smoking use included cigarettes. She has never used smokeless tobacco. She reports that she does not currently use alcohol. She reports that she does not use drugs.  Allergies  Allergen Reactions   Tape Other (See Comments)    Paper tape ONLY, please!! Not allergic, sensitive   Ace Inhibitors Cough   Ambien [Zolpidem] Other (See Comments)    "Odd feeling"   Crestor [Rosuvastatin] Other (See Comments)    Sore muscles   Leflunomide Other (See Comments)    Elevated LFTs   Pravastatin Other (See Comments)    Sore muscles   Prozac [Fluoxetine Hcl] Other (See Comments)    Decreased libido   Zoloft [Sertraline Hcl] Other (See Comments)    Reaction not recalled    Family History  Problem Relation Age of Onset   Lung cancer Mother    Stomach cancer Father    Heart attack Father        MI at age 52   Colon cancer Maternal Grandfather    Heart attack Brother        MI at age 30   Prostate cancer Paternal Grandfather    Breast cancer Paternal Aunt     Prior to Admission medications   Medication Sig Start Date End Date Taking? Authorizing Provider  alendronate (FOSAMAX) 70 MG tablet Take 1 tablet once weekly with a full glass of water on an empty stomach for 30 minutes for Osteoporosis. 12/12/23 12/11/24   Adela Glimpse, NP  Ascorbic Acid (VITAMIN C) 1000 MG tablet Take 1,000 mg by mouth at bedtime.    [provider]  aspirin EC 81 MG tablet Take 1 tablet (81 mg total) by mouth 2 (two) times daily with a meal. Take x 1 month post op to decrease risk of blood clots. 09/14/23 09/13/24  Marshia Ly, PA-C  Cholecalciferol (VITAMIN D3) 125 MCG (5000 UT) CAPS Take 5,000 Units by mouth 3 (three) times a week.    [provider]  folic acid (FOLVITE) 1 MG tablet Take 1-3 mg by mouth daily.    [provider]  gabapentin (NEURONTIN) 600 MG tablet TAKE 1/2 TO 1 TABLET 2 TO 3 TIMES DAILY AS NEEDED FOR PAIN 07/25/23   Lucky Cowboy, MD  HYDROcodone-acetaminophen (NORCO) 5-325 MG tablet Take 1-2 tablets by mouth every 6 (six) hours as needed for moderate pain (pain score 4-6). 09/14/23   Marshia Ly, PA-C  hydroxychloroquine (PLAQUENIL) 200 MG tablet Take 200 mg by mouth 2 (two) times daily.    [provider]  levothyroxine (SYNTHROID) 100 MCG tablet Take 1 tablet (100 mcg total) by mouth daily. 10/28/23 10/27/24  Raynelle Dick, NP  magnesium gluconate (MAGONATE) 500 MG tablet Take 500 mg by mouth See admin instructions. Take 500 mg by mouth at bedtime on Mon/Wed/Fri    [provider]  methotrexate 50 MG/2ML injection Inject 25 mg into the skin every Thursday. 04/16/21   [provider]  omeprazole (PRILOSEC) 40 MG capsule TAKE 1 CAPSULE TWICE DAILY TO PREVENT HEARTBURN AND INDIGESTION Patient taking differently: Take 40 mg by mouth 2 (two) times daily as needed (acid reflux). 01/13/23   Raynelle Dick, NP  traZODone (DESYREL) 150 MG tablet TAKE 1 TABLET 1 HOUR BEFORE BEDTIME AS NEEDED FOR SLEEP 03/03/22   Aundria Rud,  Rosalva Ferron, NP  zinc gluconate 50 MG tablet Take 50 mg by mouth daily. 3 times a week    [provider]     Physical Exam: Vitals:   01/18/24 2109 01/18/24 2330 01/19/24 0000 01/19/24 0152  BP: 131/84 (!) 146/84 (!) 154/89    Pulse: (!) 102 (!) 106 (!) 107   Resp: 12 (!) 28 18   Temp: 98.6 F (37 C)   99.3 F (37.4 C)  TempSrc: Oral     SpO2: 93% 93% 100%     Physical Exam Constitutional:      Appearance: She is ill-appearing.  HENT:     Mouth/Throat:     Mouth: Mucous membranes are moist.  Eyes:     Pupils: Pupils are equal, round, and reactive to light.  Cardiovascular:     Rate and Rhythm: Normal rate and regular rhythm.     Pulses: Normal pulses.     Heart sounds: Normal heart sounds.  Pulmonary:     Effort: Pulmonary effort is normal.     Breath sounds: Normal breath sounds.  Abdominal:     General: Bowel sounds are normal. There is no distension.     Tenderness: There is no abdominal tenderness. There is no guarding.  Musculoskeletal:        General: No swelling.     Cervical back: Neck supple.     Right lower leg: No edema.     Left lower leg: No edema.  Skin:    General: Skin is dry.     Capillary Refill: Capillary refill takes less than 2 seconds.  Neurological:     Mental Status: She is alert and oriented to person, place, and time.  Psychiatric:        Mood and Affect: Mood normal.        Behavior: Behavior normal.        Thought Content: Thought content normal.      Labs on Admission: I have personally reviewed following labs and imaging studies  CBC: Recent Labs  Lab 01/18/24 2228 01/19/24 0025  WBC 1.1*  --   NEUTROABS 0.1*  --   HGB 8.1*  --   HCT 25.4*  --   MCV 92.0  --   PLT 15* 24*   Basic Metabolic Panel: Recent Labs  Lab 01/18/24 2228  NA 132*  K 3.0*  CL 96*  CO2 24  GLUCOSE 119*  BUN 8  CREATININE 0.79  CALCIUM 8.8*   GFR: CrCl cannot be calculated (Unknown ideal weight.). Liver Function Tests: Recent Labs  Lab 01/18/24 2228  AST 43*  ALT 32  ALKPHOS 119  BILITOT 1.8*  PROT 5.1*  ALBUMIN 2.3*   No results for input(s): "LIPASE", "AMYLASE" in the last 168 hours. No results for input(s): "AMMONIA" in the last 168  hours. Coagulation Profile: Recent Labs  Lab 01/19/24 0025  INR 1.1   Cardiac Enzymes: Recent Labs  Lab 01/18/24 2228  TROPONINIHS 294*   BNP (last 3 results) No results for input(s): "BNP" in the last 8760 hours. HbA1C: No results for input(s): "HGBA1C" in the last 72 hours. CBG: No results for input(s): "GLUCAP" in the last 168 hours. Lipid Profile: No results for input(s): "CHOL", "HDL", "LDLCALC", "TRIG", "CHOLHDL", "LDLDIRECT" in the last 72 hours. Thyroid Function Tests: No results for input(s): "TSH", "T4TOTAL", "FREET4", "T3FREE", "THYROIDAB" in the last 72 hours. Anemia Panel: No results for input(s): "VITAMINB12", "FOLATE", "FERRITIN", "TIBC", "IRON", "RETICCTPCT" in the last 72 hours.  Urine analysis:    Component Value Date/Time   COLORURINE YELLOW 07/25/2023 0959   APPEARANCEUR CLEAR 07/25/2023 0959   LABSPEC 1.008 07/25/2023 0959   PHURINE 7.0 07/25/2023 0959   GLUCOSEU NEGATIVE 07/25/2023 0959   HGBUR NEGATIVE 07/25/2023 0959   BILIRUBINUR NEGATIVE 04/13/2017 1101   KETONESUR NEGATIVE 07/25/2023 0959   PROTEINUR NEGATIVE 07/25/2023 0959   UROBILINOGEN 0.2 06/28/2011 1225   NITRITE NEGATIVE 07/25/2023 0959   LEUKOCYTESUR 2+ (A) 07/25/2023 0959    Radiological Exams on Admission: I have personally reviewed images CT Angio Chest PE W and/or Wo Contrast Result Date: 01/19/2024 CLINICAL DATA:  Pulmonary embolism (PE) suspected, high prob; Abdominal pain, acute, nonlocalized. Neutropenia, dyspnea, cough, melena, elevated D-dimer EXAM: CT ANGIOGRAPHY CHEST CT ABDOMEN AND PELVIS WITH CONTRAST TECHNIQUE: Multidetector CT imaging of the chest was performed using the standard protocol during bolus administration of intravenous contrast. Multiplanar CT image reconstructions and MIPs were obtained to evaluate the vascular anatomy. Multidetector CT imaging of the abdomen and pelvis was performed using the standard protocol during bolus administration of intravenous  contrast. RADIATION DOSE REDUCTION: This exam was performed according to the departmental dose-optimization program which includes automated exposure control, adjustment of the mA and/or kV according to patient size and/or use of iterative reconstruction technique. CONTRAST:  OMNIPAQUE IOHEXOL 350 MG/ML SOLN COMPARISON:  None Available. FINDINGS: CTA CHEST FINDINGS Cardiovascular: Mood there is adequate opacification of the pulmonary arterial tree. There are multiple intraluminal filling defects identified within multiple segmental pulmonary arteries of the right upper lobe, right lower lobe left lobe keeping with changes of acute pulmonary embolus. The embolic burden is moderate. The central pulmonary arteries are enlarged in keeping with changes of pulmonary arterial hypertension. There is, however, no CT evidence of right heart strain. Moderate left anterior descending coronary artery calcification. Global cardiac size is mildly enlarged. No pericardial effusion. Mild atherosclerotic calcification within the thoracic aorta. No aortic aneurysm. Mediastinum/Nodes: Shotty mediastinal adenopathy may be reactive in nature. No frankly pathologic thoracic adenopathy. 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 Esophagus unremarkable. Lungs/Pleura: There is extensive reticulonodular infiltrate throughout the lungs bilaterally with more focal consolidation within right lower lobe, possibly related to extensive pneumonic consolidation with superimposed infiltrate related to pulmonary infarct. Small right and trace left pleural effusions are present. No pneumothorax. Musculoskeletal: Osseous structures are age-appropriate. No acute bone abnormality. Review of the MIP images confirms the above findings. CT ABDOMEN and PELVIS FINDINGS Hepatobiliary: Multiple cysts are seen scattered throughout the liver. Status post cholecystectomy. No intra or extrahepatic biliary ductal dilation. Pancreas: Unremarkable Spleen: Unremarkable  Adrenals/Urinary Tract: Adrenal glands are unremarkable. Kidneys are normal, without renal calculi, focal lesion, or hydronephrosis. Bladder is unremarkable. 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 Moderate sigmoid diverticulosis. Stomach, small bowel, and large bowel are otherwise unremarkable. Mild ascites. No free intraperitoneal gas. Vascular/Lymphatic: Aortic atherosclerosis. No enlarged abdominal or pelvic lymph nodes. Reproductive: Status post hysterectomy. No adnexal masses. Other: No abdominal wall hernia Musculoskeletal: Status post left hip ORIF. Degenerative changes are seen within the lumbar spine. No acute bone abnormality. No lytic or blastic bone lesion. Review of the MIP images confirms the above findings. IMPRESSION: 1. Acute pulmonary embolus with moderate embolic burden. No CT evidence of right heart strain. 2. Extensive reticulonodular infiltrate throughout the lungs bilaterally with more focal consolidation within the right lower lobe, possibly related to extensive pneumonic consolidation with superimposed infiltrate related to pulmonary infarct. 3. Small right and trace left pleural effusions. 4. Moderate left anterior descending  coronary artery calcification. Mild cardiomegaly. 5. Mild ascites. 6. Moderate sigmoid diverticulosis. 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 Aortic Atherosclerosis (ICD10-I70.0). Electronically Signed   By: Helyn Numbers M.D.   On: 01/19/2024 02:32   CT ABDOMEN PELVIS W CONTRAST Result Date: 01/19/2024 CLINICAL DATA:  Pulmonary embolism (PE) suspected, high prob; Abdominal pain, acute, nonlocalized. Neutropenia, dyspnea, cough, melena, elevated D-dimer EXAM: CT ANGIOGRAPHY CHEST CT ABDOMEN AND PELVIS WITH CONTRAST TECHNIQUE: Multidetector CT imaging of the chest was performed using the standard protocol during bolus administration of intravenous contrast. Multiplanar CT image reconstructions and MIPs were obtained to  evaluate the vascular anatomy. Multidetector CT imaging of the abdomen and pelvis was performed using the standard protocol during bolus administration of intravenous contrast. RADIATION DOSE REDUCTION: This exam was performed according to the departmental dose-optimization program which includes automated exposure control, adjustment of the mA and/or kV according to patient size and/or use of iterative reconstruction technique. CONTRAST:  OMNIPAQUE IOHEXOL 350 MG/ML SOLN COMPARISON:  None Available. FINDINGS: CTA CHEST FINDINGS Cardiovascular: Mood there is adequate opacification of the pulmonary arterial tree. There are multiple intraluminal filling defects identified within multiple segmental pulmonary arteries of the right upper lobe, right lower lobe left lobe keeping with changes of acute pulmonary embolus. The embolic burden is moderate. The central pulmonary arteries are enlarged in keeping with changes of pulmonary arterial hypertension. There is, however, no CT evidence of right heart strain. Moderate left anterior descending coronary artery calcification. Global cardiac size is mildly enlarged. No pericardial effusion. Mild atherosclerotic calcification within the thoracic aorta. No aortic aneurysm. Mediastinum/Nodes: Shotty mediastinal adenopathy may be reactive in nature. No frankly pathologic thoracic adenopathy. 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 Esophagus unremarkable. Lungs/Pleura: There is extensive reticulonodular infiltrate throughout the lungs bilaterally with more focal consolidation within right lower lobe, possibly related to extensive pneumonic consolidation with superimposed infiltrate related to pulmonary infarct. Small right and trace left pleural effusions are present. No pneumothorax. Musculoskeletal: Osseous structures are age-appropriate. No acute bone abnormality. Review of the MIP images confirms the above findings. CT ABDOMEN and PELVIS FINDINGS Hepatobiliary: Multiple  cysts are seen scattered throughout the liver. Status post cholecystectomy. No intra or extrahepatic biliary ductal dilation. Pancreas: Unremarkable Spleen: Unremarkable Adrenals/Urinary Tract: Adrenal glands are unremarkable. Kidneys are normal, without renal calculi, focal lesion, or hydronephrosis. Bladder is unremarkable. 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 Moderate sigmoid diverticulosis. Stomach, small bowel, and large bowel are otherwise unremarkable. Mild ascites. No free intraperitoneal gas. Vascular/Lymphatic: Aortic atherosclerosis. No enlarged abdominal or pelvic lymph nodes. Reproductive: Status post hysterectomy. No adnexal masses. Other: No abdominal wall hernia Musculoskeletal: Status post left hip ORIF. Degenerative changes are seen within the lumbar spine. No acute bone abnormality. No lytic or blastic bone lesion. Review of the MIP images confirms the above findings. IMPRESSION: 1. Acute pulmonary embolus with moderate embolic burden. No CT evidence of right heart strain. 2. Extensive reticulonodular infiltrate throughout the lungs bilaterally with more focal consolidation within the right lower lobe, possibly related to extensive pneumonic consolidation with superimposed infiltrate related to pulmonary infarct. 3. Small right and trace left pleural effusions. 4. Moderate left anterior descending coronary artery calcification. Mild cardiomegaly. 5. Mild ascites. 6. Moderate sigmoid diverticulosis. 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 Aortic Atherosclerosis (ICD10-I70.0). Electronically Signed   By: Helyn Numbers M.D.   On: 01/19/2024 02:32   CT Head Wo Contrast Result Date: 01/19/2024 CLINICAL DATA:  Altered mental status EXAM: CT HEAD WITHOUT CONTRAST TECHNIQUE: Contiguous  axial images were obtained from the base of the skull through the vertex without intravenous contrast. RADIATION DOSE REDUCTION: This exam was performed according to the  departmental dose-optimization program which includes automated exposure control, adjustment of the mA and/or kV according to patient size and/or use of iterative reconstruction technique. COMPARISON:  09/20/2005 FINDINGS: Brain: There is no mass, hemorrhage or extra-axial collection. There is generalized atrophy without lobar predilection. Hypodensity of the white matter is most commonly associated with chronic microvascular disease. Vascular: Atherosclerotic calcification of the internal carotid arteries at the skull base. No abnormal hyperdensity of the major intracranial arteries or dural venous sinuses. Skull: The visualized skull base, calvarium and extracranial soft tissues are normal. Sinuses/Orbits: No fluid levels or advanced mucosal thickening of the visualized paranasal sinuses. No mastoid or middle ear effusion. Normal orbits. Other: None. IMPRESSION: 1. No acute intracranial abnormality. 2. Generalized atrophy and findings of chronic microvascular disease. Electronically Signed   By: Deatra Robinson M.D.   On: 01/19/2024 02:01   DG Chest Port 1 View Result Date: 01/18/2024 CLINICAL DATA:  Shortness of breath. EXAM: PORTABLE CHEST 1 VIEW COMPARISON:  07/06/2023 FINDINGS: The heart is enlarged. Diffuse interstitial opacities throughout both lungs with more patchy opacity at the lung bases. No pneumothorax. There may be a right pleural effusion. IMPRESSION: Cardiomegaly with diffuse interstitial opacities and more patchy opacity at the lung bases, may represent pulmonary edema, multifocal pneumonia, or combination there. Possible right pleural effusion. Electronically Signed   By: Narda Rutherford M.D.   On: 01/18/2024 23:08     EKG: My personal interpretation of EKG shows: Sinus tachycardia heart rate 102 and premature ventricular complex.  There is no ST anterior abnormality.    Assessment/Plan: Principal Problem:   Sepsis due to pneumonia [facility]) Active Problems:   Acute on chronic anemia    Pancytopenia (HCC)   Elevated troponin   Consumptive coagulopathy (HCC)   Acute pulmonary embolism (HCC)   Essential hypertension   Rheumatoid arthritis (HCC)   Hyperlipidemia   Hypothyroidism   Hyponatremia   Osteoarthritis, knee   Hypokalemia    Assessment and Plan: Sepsis secondary to multifocal pneumonia -Presented to emergency department generalized abdominal weakness and currently mostly bedbound per family. -At presentation to ED patient is tachycardic, tachypneic O2 sat 100% room air. - CBC showing significant pancytopenia and low WBC count 1.1, low RBC 2.76 and low platelet count 15.  Decreased absolute neutrophil count 100. - Simbiso low sodium 132, low potassium 3, low chloride 96 low albumin 2.3 elevated bilirubin 1.8. -Elevated D-dimer 2.19 - DIC panel showing increase fibrinogen 493, elevated D-dimer 2.32, low platelet 24, normal pro time INR. -Lactic acid within normal range 1.7. - Respiratory panel negative for COVID, RSV and flu - Chest x-ray showing cardiomegaly with evidence of multifocal pneumonia.  Possible right-sided pleural effusion. -CT head without contrast no acute endocrine abnormality. -CTA chest showed acute pulmonary embolism with moderate embolic burden.  No CT evidence of right heart strain.  Extensive reticular nodular infiltrate throughout the lungs bilaterally with more focal consolidation within the right lower lobe related to extensive pulmonary consolidation with superimposed infiltrate related to pulmonary infarction. - CT abdomen pelvis no acute abdominal abnormality.  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 - In the ED patient has been treated with IV vancomycin and cefepime with concern for pneumonia. - Pending blood cultures results.  Obtaining sputum culture, urine Legionella and strep urine strep antigen test. -Continue broad-spectrum antibiotic coverage with IV vancomycin and cefepime -  Will follow-up with culture results for  appropriate antibiotic guidance.   Acute pulmonary embolism -CTA chest showing acute pulmonary embolism with moderate embolic burden.  No CT evidence of right heart strain. - Discussed patient's case withICU physician Dr. Sherryll Burger agrees with transfusing platelet and goal to improve platelet 50K afterward can start IV heparin drip without any bolus and APTT goal is around 45.  -Holding IV heparin drip transfusion at this time setting of significant thrombocytopenia.  Once platelet count will improve to 50K we will start IV heparin drip without any bolus. -Consulted pharmacy for heparin drip. -Obtaining echocardiogram - Obtaining bilateral lower extremity ultrasound to rule out DVT.   Pancytopenia Thrombocytopenia -CBC showing low WBC count 1.1, blood platelet count 15 and low RBC 2.76.  Used to have normal WBC and RBC count however evidence of chronic pancytopenia however platelet count around 161 2 months ago. -Concern for pancytopenia in the setting of sepsis and possible underlying bone marrow suppression. -Transfusing 2 unit of platelet and 1 unit of PRBC tonight. -Consulted hematology Dr. Arbutus Ped for evaluation.   Acute on chronic anemia Possible lower GI bleed -Patient has been reported that they have noticed black color to stool.  Denies any active GI bleed. - Baseline hemoglobin around 11 2 months ago and today hemoglobin is 8.1 - Checking FOBT and transfusing 1 unit of PRBC. -Starting IV Protonix 40 mg daily. -If FOBT positive in that case need to consult GI.   Elevated troponin -Elevated troponin secondary to demand ischemia in the setting of sepsis and pulmonary embolism. - Patient is complaining about right-sided lower chest wall pain.   -EKG no evidence of ST-T wave abnormality.  Acute coronary syndrome ruled out. -Continue to trend troponin.  Continue cardiac monitoring. - Obtain echocardiogram to rule out any wall motions abnormality.  Consumptive coagulopathy-in the  setting of sepsis -Elevated D-dimer, fibrinogen.  Normal PT and INR. -Conceptive coagulopathy in the setting of sepsis and bone marrow suppression rather than platelet consumption in the setting of pulmonary embolism. - Continue to monitor APTT, INR and PTT and DIC panel.  Essential hypertension -Blood pressure within good range.  Holding any oral blood pressure regimen in the setting of sepsis.   Hypokalemia -Replating with oral and IV KCl.  Hyponatremia -Low potassium 132.   Continue monitor serum sodium level.  Hypothyroidism -Continue oral levothyroxine.  History of rheumatoid arthritis -Continue oral Plaquenil.    DVT prophylaxis:  SCDs Code Status:  Full Code Diet: Heart healthy diet Family Communication: Patient's husband present at bedside, at the time of interview. Opportunity was given to ask question and all questions were answered satisfactorily.  Disposition Plan: Continue monitor improvement of platelet count, and once it is improved to 50K can start IV heparin drip Consults: Hematology and ICU Admission status:   Inpatient, Step Down Unit  Severity of Illness: The appropriate patient status for this patient is INPATIENT. Inpatient status is judged to be reasonable and necessary in order to provide the required intensity of service to ensure the patient's safety. The patient's presenting symptoms, physical exam findings, and initial radiographic and laboratory data in the context of their chronic comorbidities is felt to place them at high risk for further clinical deterioration. Furthermore, it is not anticipated that the patient will be medically stable for discharge from the hospital within 2 midnights of admission.   * I certify that at the point of admission it is my clinical judgment that the patient will require inpatient hospital care  spanning beyond 2 midnights from the point of admission due to high intensity of service, high risk for further deterioration  and high frequency of surveillance required.Marland Kitchen    Tereasa Coop, MD [facility]  How to contact the [facility] Attending or Consulting provider 7A - 7P or covering provider during after hours 7P -7A, for this patient.  Check the care team in [facility] and look for a) attending/consulting [facility] provider listed and b) the [facility] team listed Log into [email or web address] and use [place]'s universal password to access. If you do not have the password, please contact the hospital operator. Locate the [facility] provider you are looking for under [facility] and page to a number that you can be directly reached. If you still have difficulty reaching the provider, please page the [facility] (Director on Call) for the Hospitalists listed on amion for assistance.  01/19/2024, 3:25 AM

## 2024-01-19 NOTE — ED Notes (Addendum)
 Patient placed on high flow humidified [place] at this time after speaking with respiratory. Patient was on NR but still sating mid 80's. Patient sats 92% at this time on 9L high flow.

## 2024-01-19 NOTE — ED Notes (Addendum)
 Patient placed on NR at this time due to her 02 sats being 87. Respiratory contacted and unavailable to come at this time per Tabitha.

## 2024-01-19 NOTE — Progress Notes (Signed)
 eLink Physician-Brief Progress Note Patient Name: [name] DOB: [birth year] MRN: [id number]   Date of Service  01/19/2024  HPI/Events of Note  Patient admitted with pneumonia and pulmonary embolism.  eICU Interventions  New Patient Evaluation.        [name] 01/19/2024, 10:07 PM

## 2024-01-19 NOTE — Progress Notes (Addendum)
 PHARMACY - ANTICOAGULATION CONSULT NOTE  Pharmacy Consult for Heparin  Indication: pulmonary embolus  Allergies  Allergen Reactions   Tape Other (See Comments)    Paper tape ONLY, please!! Not allergic, sensitive   Ace Inhibitors Cough   Ambien [Zolpidem] Other (See Comments)    "Odd feeling"   Crestor [Rosuvastatin] Other (See Comments)    Sore muscles   Leflunomide Other (See Comments)    Elevated LFTs   Pravastatin Other (See Comments)    Sore muscles   Prozac [Fluoxetine Hcl] Other (See Comments)    Decreased libido   Zoloft [Sertraline Hcl] Other (See Comments)    Reaction not recalled    Patient Measurements:   Heparin Dosing Weight: [facility] ED reports height 5'2 and weight 135 (61 kg)  Vital Signs: Temp: 98.9 F (37.2 C) (03/13 0746) Temp Source: Oral (03/13 0746) BP: 140/73 (03/13 0746) Pulse Rate: 116 (03/13 0746)  Labs: Recent Labs    01/18/24 2228 01/19/24 0025 01/19/24 0651 01/19/24 0835  HGB 8.1*  --   --  6.4*  HCT 25.4*  --   --  19.6*  PLT 15* 24*  --  41*  APTT  --  34 37*  --   LABPROT  --  14.5 15.2  --   INR  --  1.1 1.2  --   CREATININE 0.79  --  0.76  --   TROPONINIHS 294*  --  357* 364*    CrCl cannot be calculated (Unknown ideal weight.).   Medical History: Past Medical History:  Diagnosis Date   Anemia    Hx of    Arthritis    Calculus of bile duct without mention of cholecystitis or obstruction    Cataract    GERD (gastroesophageal reflux disease)    H. pylori infection    Hx of    Heart murmur    Hyperlipidemia    Hypertension    Hypothyroid    PUD (peptic ulcer disease)    Rheumatoid arthritis(714.0)     Medications:  Scheduled:   sodium chloride   Intravenous Once   sodium chloride   Intravenous Once   folic acid  1-3 mg Oral Daily   guaiFENesin  600 mg Oral BID   levothyroxine  100 mcg Oral Daily   pantoprazole (PROTONIX) IV  40 mg Intravenous Q12H   sodium chloride flush  3 mL Intravenous Q12H   sodium  chloride flush  3 mL Intravenous Q12H   Infusions:   sodium chloride     ceFEPime (MAXIPIME) IV     lactated ringers Stopped (01/19/24 0746)   [START ON 01/28/2024] vancomycin      Assessment: 77 yoF presented on 3/12 with weakness, cough, chest pain, hypoxia.  CTa with PE, but also noted to have significant pancytopenia.  PMH is significant for hypothyroidism, anemia, PUD, RA on methotrexate and Plaquenil, HTN, hip fracture s/p IM nailing (07/15/23), and recent TKA (09/2023, ASA 81 mg BID x1 month).  Pharmacy is consulted to dose heparin, but held initially for thrombocytopenia.  Per discussion with Dr. Wynona Neat on 3/13, start heparin once transfusion of Plt and PRBC are completed.    Baseline INR 1.2, no prior to admission anticoagulation.   Hgb decreased to 6.4, no s/s bleeding reported.  2 units PRBC ordered.   Platelet counts: 3/12 22:28:  Plt 15 3/13 00:25 Plt 24.  2 units Plt ordered, but not transfused yet.   3/13 08:35 Plt 41  Goal of Therapy:  Heparin  level 0.3-0.7 units/ml Monitor platelets by anticoagulation protocol: Yes   Plan:  Continue to hold off starting Heparin IV until transfusions completed.  CBC ordered TID x5 days. No heparin bolus when starting.    Lynann Beaver PharmD, BCPS [facility] main pharmacy [phone] 01/19/2024 9:48 AM   Addendum:  01/19/2024, 5:35 PM  So far, first RBC and platelets are completed per Julaine Hua, paramedic Repeat CBC pending   Plan:   When able to start heparin after transfusion are completed:  No heparin bolus.  Start heparin IV infusion at 1000 units/hr (16.4 units/kg) Heparin level 8 hours after starting Daily heparin level and CBC once stable Continue to monitor H&H and platelets, s/s bleeding.  Lynann Beaver PharmD, BCPS [facility] main pharmacy [phone] 01/19/2024 5:42 PM

## 2024-01-19 NOTE — ED Provider Notes (Signed)
 Care assumed from Dr. Anitra Lauth.  Patient here with generalized weakness for the past several months as well as cough, shortness of breath and right-sided chest pain.  She has multiple lab abnormalities including pancytopenia, leukopenia, thrombocytopenia, elevated troponin, elevated D-dimer.  Her vital signs are stable but patient appears quite ill.  She complains of right-sided chest pain and shortness of breath.  Does have mild tachycardia to the 1 teens.  X-ray concerning for multifocal airspace disease.  She was given broad-spectrum antibiotics for suspected pneumonia.  CT scan is pending to evaluate for pulmonary embolism.  DIC panel shows no schistocytes.  Does have elevated fibrinogen, INR and D-dimer.  IMPRESSION: 1. Acute pulmonary embolus with moderate embolic burden. No CT evidence of right heart strain. 2. Extensive reticulonodular infiltrate throughout the lungs bilaterally with more focal consolidation within the right lower lobe, possibly related to extensive pneumonic consolidation with superimposed infiltrate related to pulmonary infarct. 3. Small right and trace left pleural effusions. 4. Moderate left anterior descending coronary artery calcification. Mild cardiomegaly. 5. Mild ascites. 6. Moderate sigmoid diverticulosis. 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    Patient remains borderline tachycardic.  Denies chest pain.  Denies difficulty breathing.  O2 100% on 2 L.  CT scan as above.  Does have moderate pulmonary emboli clot burden without right heart strain.  Does have extensive reticular infiltrates throughout the lungs bilaterally.  Admission discussed with Dr. Janalyn Shy.  She is spoken with Dr. Sherryll Burger critical care.  He would recommend transfusing 2 units of platelets to increase her platelet count to 50,000 before starting heparin.  .Critical Care  Performed by: Glynn Octave, MD Authorized by: Glynn Octave, MD   Critical care provider  statement:    Critical care time (minutes):  60   Critical care time was exclusive of:  Separately billable procedures and treating other patients   Critical care was necessary to treat or prevent imminent or life-threatening deterioration of the following conditions:  Sepsis and circulatory failure   Critical care was time spent personally by me on the following activities:  Development of treatment plan with patient or surrogate, discussions with consultants, evaluation of patient's response to treatment, examination of patient, ordering and review of laboratory studies, ordering and review of radiographic studies, ordering and performing treatments and interventions, pulse oximetry, re-evaluation of patient's condition, review of old charts, blood draw for specimens and obtaining history from patient or surrogate   I assumed direction of critical care for this patient from another provider in my specialty: no     Care discussed with: admitting provider         Glynn Octave, MD 01/19/24 [phone]

## 2024-01-19 NOTE — Consult Note (Signed)
 WOC Nurse Consult Note: Reason for Consult: R leg wound  Wound type: full thickness of unknown etiology ? Trauma  Pressure Injury POA: NA  Measurement: see nursing flowsheet  Wound bed: 60% dry brown 40%red  Drainage (amount, consistency, odor)  per nursing flowsheet  Periwound: intact  Dressing procedure/placement/frequency: Cleanse R leg wound with NS, apply Medihoney to wound bed daily, cover with dry gauze and silicone foam or Kerlix whichever is preferred.   POC discussed with bedside nurse. WOC team will not follow. Re-consult if further needs arise.   Thank you,    Priscella Mann MSN, RN-BC, [employer] [phone]

## 2024-01-19 NOTE — Progress Notes (Addendum)
 PROGRESS NOTE    [name]  ZOX:[id number] DOB: [birth year] DOA: 01/18/2024 PCP: Lucky Cowboy, MD    Brief Narrative:   [name] is a 78 y.o. female with past medical history significant for HTN, HLD, prediabetes, hypothyroidism, vitamin D deficiency, osteoarthritis left knee with recent total knee arthroplasty 09/14/2023,  rheumatoid arthritis on methotrexate/Plaquenil outpatient, bedbound/wheelchair dependent for several months who presented to [facility] ED on 01/18/2024 from home via ambulance with progressive weakness, shortness of breath, generalized malaise over the last week.  Husband present who contributes to HPI.  Over the last 3-4 months patient has had gradually worsening diffuse weakness now not able to stand or hold her weight.  Additionally patient has developed a cough, shortness of breath, decreased oral intake and occasional vomiting during the last week.  Husband has also noted swelling in her right leg, noted to be present following surgery but now progressed.  On arrival of EMS, oxygen saturation noted to be 88% on room air.  Does not utilize inhalers or oxygen at home.  Does not utilize anticoagulation or antiplatelets outpatient.  Patient denies fever, chills, no current nausea/vomiting, no hematemesis or melena.  In the ED, temperature 98.6 F, HR 107, RR 28, BP 131/84, SpO2 93% on 5 L nasal cannula.  WBC 1.1, hemoglobin 8.1, platelet count 15.  Sodium 132, potassium 3.0, chloride 96, CO2 24, glucose 119, BUN 8, creatinine 0.79.  AST 43, ALT 32, total bilirubin 1.8.  High sensitive troponin 294.  D-dimer 2.19.  COVID/influenza/RSV PCR negative.  CT head without contrast with no acute intracranial abnormality, generalized atrophy and findings of chronic microvascular disease.  CT angiogram chest with acute pulmonary embolism with moderate embolic burden no evidence of right heart strain, extensive reticular nodular infiltrate throughout the lungs  bilaterally with more focal consolidation with the right lower lobe possible related to extensive pneumonic consolidation with superimposed infiltrate related to pulmonary infarct, small right/trace left pleural effusions, moderate left anterior descending coronary artery calcification, mild cardiomegaly. 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  EKG with sinus tachycardia, rate 102.  Patient was started on IV vancomycin and cefepime.  TRH consulted for admission for further evaluation and management of pneumonia, leukopenia, thrombocytopenia, anemia, hyponatremia, hypokalemia, pulmonary embolism.  Assessment & Plan:   Sepsis ruled out Community-acquired pneumonia Patient presenting with cough, shortness of breath over the last week.  Imaging notable for consolidation right lower lobe. -- MRSA PCR: Negative -- Respiratory viral panel: Pending -- Blood cultures x 2: Pending -- Azithromycin 500 mg IV q24h x 5 days -- Ceftriaxone 2 g IV q24h -- Continue supplemental oxygen, maintain SpO2 > 92%; currently on 5 L [employer] with SpO2 93% at rest -- Mucinex 600 mg p.o. twice daily -- Xopenex neb every 6 hours as needed wheezing/shortness of breath -- CBC in a.m., check procalcitonin  Acute pulmonary embolism with pulmonary infarction Bilateral lower extremity DVT D-dimer elevated 2.19 on admission.  CT angiogram chest with acute pulmonary embolism with moderate embolic burden with no evidence of right heart strain.  Vascular duplex ultrasound bilateral lower extremities with acute DVT right popliteal vein, right peroneal vein, acute intramuscular thrombosis right gastrocnemius vein, left popliteal vein, left gastrocnemius vein.  Etiology likely secondary to poor mobility status as has been relatively bedbound over the last 3-4 months; also with recent surgical intervention with left total knee arthroplasty November 2024 as  possible contributing factor.   -- PCCM following,  appreciate assistance -- TTE: Pending -- Heparin drip ordered to start without bolus following transfusion of PRBCs and platelets -- Continue supplemental oxygen, maintain SpO2 >95% -- Monitor on telemetry  Pancytopenia Thrombocytopenia, severe Suspect etiology likely secondary to bone marrow suppression in the setting of methotrexate and Plaquenil use outpatient. --Following, appreciate assistance -- WBC 1.1>0.7 -- Hgb 8.1>6.4 -- Plt 15>24>41 -- s/p 1u Plt 3/13; 1 until pending -- 3u pRBC ordered -- Repeat CBC 2 hours posttransfusion  Elevated troponin likely secondary to type II demand ischemia Patient with elevated troponin, no evidence of ST/T wave abnormality on EKG.  Etiology likely secondary to type II demand ischemia in the setting of anemia, pulmonary embolism. -- hs Troponin [phone] -- Continue to trend troponin until peaks -- Continue to monitor on telemetry  Hyponatremia Sodium 132 on admission, likely secondary to hypovolemic hyponatremia in the setting of poor oral intake in the days preceding hospitalization. -- Na 132>129 -- NS at 75 mL/h -- BMP in am  Hypokalemia Potassium 3.0 on admission, repleted.  Repeat potassium 4.0 this morning. -- Continue to monitor electrolytes closely  Essential hypertension Currently not on antihypertensives outpatient.  Hypothyroidism Most recent TSH 10.93 on 10/27/2023. -- Check TSH, free T4 -- Levothyroxine 100 mcg p.o. daily  History of rheumatoid arthritis Follows with rheumatology outpatient, Dr. Lendon Colonel.  Currently on methotrexate and Plaquenil.  Apparently Plaquenil has been on hold since recent total knee arthroplasty in November 2024.  Last dose of methotrexate on 01/11/2024. --Continue to hold home methotrexate and Plaquenil with suspicion of marrow suppression causing severe anemia and, thrombocytopenia   DVT prophylaxis: SCDs Start: 01/19/24 0207 Place TED hose  Start: 01/19/24 0207    Code Status: Full Code Family Communication:   Disposition Plan:  Level of care: Stepdown Status is: Inpatient Remains inpatient appropriate because: Severe pancytopenia with thrombocytopenia, acute PE, DVT, blood and platelet transfusion, needs to start heparin drip    Consultants:  PCCM Hematology  Procedures:  Vascular duplex ultrasound bilateral lower extremities  Antimicrobials:  Vancomycin 3/12 - 3/12 Cefepime 3/12 -3/12 Azithromycin 3/13>> Ceftriaxone 3/13>>   Subjective: Patient seen examined bedside, lying in bed.  Husband and daughter present at bedside.  Discussed findings of labs to include severe anemia, pancytopenia, thrombocytopenia with concerns of acute pulmonary embolism and DVT needing anticoagulation.  Very difficult situation given her high bleeding risk given her thrombocytopenia and need for transfusion.  PCCM following, transfusing platelets and PRBCs today with plan to initiate heparin drip thereafter.  Patient endorses right-sided chest discomfort without radiation, worse with deep inspiration and shortness of breath, generalized weakness, fatigue.  Patient denies headache, no visual changes, no abdominal pain, no fever/chills/night sweats, no nausea/vomiting/diarrhea, no focal weakness, no paresthesias.  Objective: Vitals:   01/19/24 0746 01/19/24 1100 01/19/24 1224 01/19/24 1235  BP: (!) 140/73 (!) 140/105 (!) 139/55 136/82  Pulse: (!) 116 (!) 119 (!) 111 (!) 113  Resp: 20 (!) 24 15 17   Temp: 98.9 F (37.2 C)  98 F (36.7 C) 98 F (36.7 C)  TempSrc: Oral  Oral Oral  SpO2: 97% 93%  93%    Intake/Output Summary (Last 24 hours) at 01/19/2024 1306 Last data filed at 01/19/2024 6962 Gross per 24 hour  Intake 294.12 ml  Output --  Net 294.12 ml   There were no vitals filed for this visit.  Examination:  Physical Exam: GEN: NAD, alert and oriented x 3, chronically ill, cachectic, pale in appearance HEENT: NCAT, PERRL,  EOMI, sclera clear,  pale conjunctiva, MMM PULM: Breath sounds diminished bilateral bases, no wheezes/crackles, normal respiratory effort without accessory muscle use, on 5 L nasal cannula with SpO2 93% at rest CV: Tachycardic, regular rhythm w/o M/G/R GI: abd soft, NTND, + BS MSK: + LE edema, right greater than left, moves all extremities independently NEURO: No focal neurological deficit PSYCH: Depressed mood, flat affect Integumentary: Right leg wound as depicted below, otherwise no other concerning rashes/lesions/wounds noted on exposed skin surfaces.      Data Reviewed: I have personally reviewed following labs and imaging studies  CBC: Recent Labs  Lab 01/18/24 2228 01/19/24 0025 01/19/24 0835  WBC 1.1*  --  0.7*  NEUTROABS 0.1*  --   --   HGB 8.1*  --  6.4*  HCT 25.4*  --  19.6*  MCV 92.0  --  91.2  PLT 15* 24* 41*   Basic Metabolic Panel: Recent Labs  Lab 01/18/24 2228 01/19/24 0651  NA 132* 129*  K 3.0* 4.0  CL 96* 97*  CO2 24 25  GLUCOSE 119* 80  BUN 8 8  CREATININE 0.79 0.76  CALCIUM 8.8* 8.3*   GFR: CrCl cannot be calculated (Unknown ideal weight.). Liver Function Tests: Recent Labs  Lab 01/18/24 2228 01/19/24 0651  AST 43* 28  ALT 32 26  ALKPHOS 119 88  BILITOT 1.8* 1.9*  PROT 5.1* 4.4*  ALBUMIN 2.3* 1.9*   No results for input(s): "LIPASE", "AMYLASE" in the last 168 hours. No results for input(s): "AMMONIA" in the last 168 hours. Coagulation Profile: Recent Labs  Lab 01/19/24 0025 01/19/24 0651  INR 1.1 1.2   Cardiac Enzymes: No results for input(s): "CKTOTAL", "CKMB", "CKMBINDEX", "TROPONINI" in the last 168 hours. BNP (last 3 results) No results for input(s): "PROBNP" in the last 8760 hours. HbA1C: No results for input(s): "HGBA1C" in the last 72 hours. CBG: No results for input(s): "GLUCAP" in the last 168 hours. Lipid Profile: No results for input(s): "CHOL", "HDL", "LDLCALC", "TRIG", "CHOLHDL", "LDLDIRECT" in the last 72  hours. Thyroid Function Tests: No results for input(s): "TSH", "T4TOTAL", "FREET4", "T3FREE", "THYROIDAB" in the last 72 hours. Anemia Panel: No results for input(s): "VITAMINB12", "FOLATE", "FERRITIN", "TIBC", "IRON", "RETICCTPCT" in the last 72 hours. Sepsis Labs: Recent Labs  Lab 01/19/24 0034  LATICACIDVEN 1.7    Recent Results (from the past 240 hours)  Resp panel by RT-PCR (RSV, Flu A&B, Covid) Anterior Nasal Swab     Status: None   Collection Time: 01/18/24  9:29 PM   Specimen: Anterior Nasal Swab  Result Value Ref Range Status   SARS Coronavirus 2 by RT PCR NEGATIVE NEGATIVE Final    Comment: (NOTE) SARS-CoV-2 target nucleic acids are NOT DETECTED.  The SARS-CoV-2 RNA is generally detectable in upper respiratory specimens during the acute phase of infection. The lowest concentration of SARS-CoV-2 viral copies this assay can detect is 138 copies/mL. A negative result does not preclude SARS-Cov-2 infection and should not be used as the sole basis for treatment or other patient management decisions. A negative result may occur with  improper specimen collection/handling, submission of specimen other than nasopharyngeal swab, presence of viral mutation(s) within the areas targeted by this assay, and inadequate number of viral copies(<138 copies/mL). A negative result must be combined with clinical observations, patient history, and epidemiological information. The expected result is Negative.  Fact Sheet for Patients:  BloggerCourse.com  Fact Sheet for Healthcare Providers:  SeriousBroker.it  This test is no t yet approved or cleared by the [place]  States FDA and  has been authorized for detection and/or diagnosis of SARS-CoV-2 by FDA under an Emergency Use Authorization (EUA). This EUA will remain  in effect (meaning this test can be used) for the duration of the COVID-19 declaration under Section 564(b)(1) of the Act,  21 U.S.C.section 360bbb-3(b)(1), unless the authorization is terminated  or revoked sooner.       Influenza A by PCR NEGATIVE NEGATIVE Final   Influenza B by PCR NEGATIVE NEGATIVE Final    Comment: (NOTE) The Xpert Xpress SARS-CoV-2/FLU/RSV plus assay is intended as an aid in the diagnosis of influenza from Nasopharyngeal swab specimens and should not be used as a sole basis for treatment. Nasal washings and aspirates are unacceptable for Xpert Xpress SARS-CoV-2/FLU/RSV testing.  Fact Sheet for Patients: BloggerCourse.com  Fact Sheet for Healthcare Providers: SeriousBroker.it  This test is not yet approved or cleared by the [place] FDA and has been authorized for detection and/or diagnosis of SARS-CoV-2 by FDA under an Emergency Use Authorization (EUA). This EUA will remain in effect (meaning this test can be used) for the duration of the COVID-19 declaration under Section 564(b)(1) of the Act, 21 U.S.C. section 360bbb-3(b)(1), unless the authorization is terminated or revoked.     Resp Syncytial Virus by PCR NEGATIVE NEGATIVE Final    Comment: (NOTE) Fact Sheet for Patients: BloggerCourse.com  Fact Sheet for Healthcare Providers: SeriousBroker.it  This test is not yet approved or cleared by the [place] FDA and has been authorized for detection and/or diagnosis of SARS-CoV-2 by FDA under an Emergency Use Authorization (EUA). This EUA will remain in effect (meaning this test can be used) for the duration of the COVID-19 declaration under Section 564(b)(1) of the Act, 21 U.S.C. section 360bbb-3(b)(1), unless the authorization is terminated or revoked.  Performed at [facility], [address] [place], [place] [address]   MRSA Next Gen by PCR, Nasal     Status: None   Collection Time: 01/19/24  4:07 AM   Specimen: Nasal Mucosa; Nasal Swab   Result Value Ref Range Status   MRSA by PCR Next Gen NOT DETECTED NOT DETECTED Final    Comment: (NOTE) The GeneXpert MRSA Assay (FDA approved for NASAL specimens only), is one component of a comprehensive MRSA colonization surveillance program. It is not intended to diagnose MRSA infection nor to guide or monitor treatment for MRSA infections. Test performance is not FDA approved in patients less than 31 years old. Performed at [facility], [address] [place], [place] [address]          Radiology Studies: VAS [place] LOWER EXTREMITY VENOUS (DVT) Result Date: 01/19/2024  Lower Venous DVT Study Patient Name:  KITARA HEBB  Date of Exam:   01/19/2024 Medical Rec #: [id number]           Accession #:    [id number] Date of Birth: [birth year]            Patient Gender: F Patient Age:   16 years Exam Location:  [facility] Procedure:      VAS [place] LOWER EXTREMITY VENOUS (DVT) Referring Phys: Tereasa Coop --------------------------------------------------------------------------------  Indications: Swelling, Edema, SOB, pulmonary embolism, and Weakness, right sided chest pain, knee replacement surgery in 2024.  Risk Factors: Confirmed PE. Limitations: Involuntary movements. Comparison Study: No prior exam. Performing Technologist: Fernande Bras  Examination Guidelines: A complete evaluation includes B-mode imaging, spectral Doppler, color Doppler, and power Doppler as needed of all accessible portions  of each vessel. Bilateral testing is considered an integral part of a complete examination. Limited examinations for reoccurring indications may be performed as noted. The reflux portion of the exam is performed with the patient in reverse Trendelenburg.  +---------+---------------+---------+-----------+----------+--------------+ RIGHT    CompressibilityPhasicitySpontaneityPropertiesThrombus Aging  +---------+---------------+---------+-----------+----------+--------------+ CFV      Full           No       Yes                                 +---------+---------------+---------+-----------+----------+--------------+ SFJ      Full           Yes      Yes                                 +---------+---------------+---------+-----------+----------+--------------+ FV Prox  Full                                                        +---------+---------------+---------+-----------+----------+--------------+ FV Mid   Full                                                        +---------+---------------+---------+-----------+----------+--------------+ FV DistalFull                                                        +---------+---------------+---------+-----------+----------+--------------+ PFV      Full                                                        +---------+---------------+---------+-----------+----------+--------------+ POP      None           No       No                                  +---------+---------------+---------+-----------+----------+--------------+ PTV      Full                                                        +---------+---------------+---------+-----------+----------+--------------+ PERO     None           No       No                                  +---------+---------------+---------+-----------+----------+--------------+ Gastroc  None           No  No                                  +---------+---------------+---------+-----------+----------+--------------+   +---------+---------------+---------+-----------+----------+-------------------+ LEFT     CompressibilityPhasicitySpontaneityPropertiesThrombus Aging      +---------+---------------+---------+-----------+----------+-------------------+ CFV      Full           Yes      Yes                                       +---------+---------------+---------+-----------+----------+-------------------+ SFJ      Full           Yes      Yes                                      +---------+---------------+---------+-----------+----------+-------------------+ FV Prox  Full                                                             +---------+---------------+---------+-----------+----------+-------------------+ FV Mid   Full                                                             +---------+---------------+---------+-----------+----------+-------------------+ FV DistalFull                                                             +---------+---------------+---------+-----------+----------+-------------------+ PFV      Full                                                             +---------+---------------+---------+-----------+----------+-------------------+ POP      None           No       No                                       +---------+---------------+---------+-----------+----------+-------------------+ PTV                                                   Not well seen,  patent by color.    +---------+---------------+---------+-----------+----------+-------------------+ PERO                                                  Not well seen,                                                            patent by color.    +---------+---------------+---------+-----------+----------+-------------------+ Gastroc  None           No       No                                       +---------+---------------+---------+-----------+----------+-------------------+     Summary: RIGHT: - Findings consistent with acute deep vein thrombosis involving the right popliteal vein, and right peroneal veins. Findings consistent with acute intramuscular thrombosis involving the right gastrocnemius veins. - No cystic  structure found in the popliteal fossa.  LEFT: - Findings consistent with acute deep vein thrombosis involving the left popliteal vein. Findings consistent with acute intramuscular thrombosis involving the left gastrocnemius veins. - No cystic structure found in the popliteal fossa.  *See table(s) above for measurements and observations. Electronically signed by Gerarda Fraction on 01/19/2024 at 11:23:25 AM.    Final    CT Angio Chest PE W and/or Wo Contrast Result Date: 01/19/2024 CLINICAL DATA:  Pulmonary embolism (PE) suspected, high prob; Abdominal pain, acute, nonlocalized. Neutropenia, dyspnea, cough, melena, elevated D-dimer EXAM: CT ANGIOGRAPHY CHEST CT ABDOMEN AND PELVIS WITH CONTRAST TECHNIQUE: Multidetector CT imaging of the chest was performed using the standard protocol during bolus administration of intravenous contrast. Multiplanar CT image reconstructions and MIPs were obtained to evaluate the vascular anatomy. Multidetector CT imaging of the abdomen and pelvis was performed using the standard protocol during bolus administration of intravenous contrast. RADIATION DOSE REDUCTION: This exam was performed according to the departmental dose-optimization program which includes automated exposure control, adjustment of the mA and/or kV according to patient size and/or use of iterative reconstruction technique. CONTRAST:  OMNIPAQUE IOHEXOL 350 MG/ML SOLN COMPARISON:  None Available. FINDINGS: CTA CHEST FINDINGS Cardiovascular: Mood there is adequate opacification of the pulmonary arterial tree. There are multiple intraluminal filling defects identified within multiple segmental pulmonary arteries of the right upper lobe, right lower lobe left lobe keeping with changes of acute pulmonary embolus. The embolic burden is moderate. The central pulmonary arteries are enlarged in keeping with changes of pulmonary arterial hypertension. There is, however, no CT evidence of right heart strain. Moderate left  anterior descending coronary artery calcification. Global cardiac size is mildly enlarged. No pericardial effusion. Mild atherosclerotic calcification within the thoracic aorta. No aortic aneurysm. Mediastinum/Nodes: Shotty mediastinal adenopathy may be reactive in nature. No frankly pathologic thoracic adenopathy. 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 Esophagus unremarkable. Lungs/Pleura: There is extensive reticulonodular infiltrate throughout the lungs bilaterally with more focal consolidation within right lower lobe, possibly related to extensive pneumonic consolidation with superimposed infiltrate related to pulmonary infarct. Small right and trace left pleural effusions are present. No pneumothorax. Musculoskeletal: Osseous structures are age-appropriate. No acute bone  abnormality. Review of the MIP images confirms the above findings. CT ABDOMEN and PELVIS FINDINGS Hepatobiliary: Multiple cysts are seen scattered throughout the liver. Status post cholecystectomy. No intra or extrahepatic biliary ductal dilation. Pancreas: Unremarkable Spleen: Unremarkable Adrenals/Urinary Tract: Adrenal glands are unremarkable. Kidneys are normal, without renal calculi, focal lesion, or hydronephrosis. Bladder is unremarkable. 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 Moderate sigmoid diverticulosis. Stomach, small bowel, and large bowel are otherwise unremarkable. Mild ascites. No free intraperitoneal gas. Vascular/Lymphatic: Aortic atherosclerosis. No enlarged abdominal or pelvic lymph nodes. Reproductive: Status post hysterectomy. No adnexal masses. Other: No abdominal wall hernia Musculoskeletal: Status post left hip ORIF. Degenerative changes are seen within the lumbar spine. No acute bone abnormality. No lytic or blastic bone lesion. Review of the MIP images confirms the above findings. IMPRESSION: 1. Acute pulmonary embolus with moderate embolic burden. No CT evidence of right heart strain. 2.  Extensive reticulonodular infiltrate throughout the lungs bilaterally with more focal consolidation within the right lower lobe, possibly related to extensive pneumonic consolidation with superimposed infiltrate related to pulmonary infarct. 3. Small right and trace left pleural effusions. 4. Moderate left anterior descending coronary artery calcification. Mild cardiomegaly. 5. Mild ascites. 6. Moderate sigmoid diverticulosis. 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 Aortic Atherosclerosis (ICD10-I70.0). Electronically Signed   By: Helyn Numbers M.D.   On: 01/19/2024 02:32   CT ABDOMEN PELVIS W CONTRAST Result Date: 01/19/2024 CLINICAL DATA:  Pulmonary embolism (PE) suspected, high prob; Abdominal pain, acute, nonlocalized. Neutropenia, dyspnea, cough, melena, elevated D-dimer EXAM: CT ANGIOGRAPHY CHEST CT ABDOMEN AND PELVIS WITH CONTRAST TECHNIQUE: Multidetector CT imaging of the chest was performed using the standard protocol during bolus administration of intravenous contrast. Multiplanar CT image reconstructions and MIPs were obtained to evaluate the vascular anatomy. Multidetector CT imaging of the abdomen and pelvis was performed using the standard protocol during bolus administration of intravenous contrast. RADIATION DOSE REDUCTION: This exam was performed according to the departmental dose-optimization program which includes automated exposure control, adjustment of the mA and/or kV according to patient size and/or use of iterative reconstruction technique. CONTRAST:  OMNIPAQUE IOHEXOL 350 MG/ML SOLN COMPARISON:  None Available. FINDINGS: CTA CHEST FINDINGS Cardiovascular: Mood there is adequate opacification of the pulmonary arterial tree. There are multiple intraluminal filling defects identified within multiple segmental pulmonary arteries of the right upper lobe, right lower lobe left lobe keeping with changes of acute pulmonary embolus. The embolic burden is moderate. The  central pulmonary arteries are enlarged in keeping with changes of pulmonary arterial hypertension. There is, however, no CT evidence of right heart strain. Moderate left anterior descending coronary artery calcification. Global cardiac size is mildly enlarged. No pericardial effusion. Mild atherosclerotic calcification within the thoracic aorta. No aortic aneurysm. Mediastinum/Nodes: Shotty mediastinal adenopathy may be reactive in nature. No frankly pathologic thoracic adenopathy. 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 Esophagus unremarkable. Lungs/Pleura: There is extensive reticulonodular infiltrate throughout the lungs bilaterally with more focal consolidation within right lower lobe, possibly related to extensive pneumonic consolidation with superimposed infiltrate related to pulmonary infarct. Small right and trace left pleural effusions are present. No pneumothorax. Musculoskeletal: Osseous structures are age-appropriate. No acute bone abnormality. Review of the MIP images confirms the above findings. CT ABDOMEN and PELVIS FINDINGS Hepatobiliary: Multiple cysts are seen scattered throughout the liver. Status post cholecystectomy. No intra or extrahepatic biliary ductal dilation. Pancreas: Unremarkable Spleen: Unremarkable Adrenals/Urinary Tract: Adrenal glands are unremarkable. Kidneys are normal, without renal calculi, focal lesion, or hydronephrosis. Bladder is unremarkable. 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 Moderate sigmoid diverticulosis. Stomach, small bowel, and large bowel are otherwise unremarkable. Mild ascites. No free intraperitoneal gas. Vascular/Lymphatic: Aortic atherosclerosis. No enlarged abdominal or pelvic lymph nodes. Reproductive: Status post hysterectomy. No adnexal masses. Other: No abdominal wall hernia Musculoskeletal: Status post left hip ORIF. Degenerative changes are seen within the lumbar spine. No acute bone abnormality. No lytic or blastic bone  lesion. Review of the MIP images confirms the above findings. IMPRESSION: 1. Acute pulmonary embolus with moderate embolic burden. No CT evidence of right heart strain. 2. Extensive reticulonodular infiltrate throughout the lungs bilaterally with more focal consolidation within the right lower lobe, possibly related to extensive pneumonic consolidation with superimposed infiltrate related to pulmonary infarct. 3. Small right and trace left pleural effusions. 4. Moderate left anterior descending coronary artery calcification. Mild cardiomegaly. 5. Mild ascites. 6. Moderate sigmoid diverticulosis. 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 Aortic Atherosclerosis (ICD10-I70.0). Electronically Signed   By: Helyn Numbers M.D.   On: 01/19/2024 02:32   CT Head Wo Contrast Result Date: 01/19/2024 CLINICAL DATA:  Altered mental status EXAM: CT HEAD WITHOUT CONTRAST TECHNIQUE: Contiguous axial images were obtained from the base of the skull through the vertex without intravenous contrast. RADIATION DOSE REDUCTION: This exam was performed according to the departmental dose-optimization program which includes automated exposure control, adjustment of the mA and/or kV according to patient size and/or use of iterative reconstruction technique. COMPARISON:  09/20/2005 FINDINGS: Brain: There is no mass, hemorrhage or extra-axial collection. There is generalized atrophy without lobar predilection. Hypodensity of the white matter is most commonly associated with chronic microvascular disease. Vascular: Atherosclerotic calcification of the internal carotid arteries at the skull base. No abnormal hyperdensity of the major intracranial arteries or dural venous sinuses. Skull: The visualized skull base, calvarium and extracranial soft tissues are normal. Sinuses/Orbits: No fluid levels or advanced mucosal thickening of the visualized paranasal sinuses. No mastoid or middle ear effusion. Normal orbits. Other: None.  IMPRESSION: 1. No acute intracranial abnormality. 2. Generalized atrophy and findings of chronic microvascular disease. Electronically Signed   By: Deatra Robinson M.D.   On: 01/19/2024 02:01   DG Chest Port 1 View Result Date: 01/18/2024 CLINICAL DATA:  Shortness of breath. EXAM: PORTABLE CHEST 1 VIEW COMPARISON:  07/06/2023 FINDINGS: The heart is enlarged. Diffuse interstitial opacities throughout both lungs with more patchy opacity at the lung bases. No pneumothorax. There may be a right pleural effusion. IMPRESSION: Cardiomegaly with diffuse interstitial opacities and more patchy opacity at the lung bases, may represent pulmonary edema, multifocal pneumonia, or combination there. Possible right pleural effusion. Electronically Signed   By: Narda Rutherford M.D.   On: 01/18/2024 23:08        Scheduled Meds:  sodium chloride   Intravenous Once   folic acid  1-3 mg Oral Daily   guaiFENesin  600 mg Oral BID   leptospermum manuka honey  1 Application Topical Daily   levothyroxine  100 mcg Oral Daily   pantoprazole (PROTONIX) IV  40 mg Intravenous Q12H   sodium chloride flush  3 mL Intravenous Q12H   sodium chloride flush  3 mL Intravenous Q12H   Continuous Infusions:  sodium chloride     azithromycin     cefTRIAXone (ROCEPHIN)  IV     lactated ringers Stopped (01/19/24 0746)     LOS: 0 days    Critical Care Time Upon my evaluation, this patient had a high probability of imminent or life-threatening deterioration due  to pneumonia, acute PE, acute DVT, pancytopenia with severe anemia/thrombocytopenia, which required my direct attention, intervention, and personal management.  I have personally provided 42 minutes of critical care time exclusive of my time spent on separately billable procedures.  Time includes review of laboratory data, radiology results, discussion with consultants, and monitoring for potential decompensation.       Alvira Philips [place], DO [facility] Available  via Epic secure chat 7am-7pm After these hours, please refer to coverage provider listed on [email or web address] 01/19/2024, 1:06 PM

## 2024-01-19 NOTE — ED Notes (Signed)
 Patient placed on 11L high flow at this time by Gypsy Balsam, Charity fundraiser . Patient sating 95% at this time

## 2024-01-19 NOTE — ED Notes (Signed)
 IV access attempted x2, w/o success. IV team order placed for second IV during blood transfusion.

## 2024-01-19 NOTE — ED Notes (Signed)
 ED TO INPATIENT HANDOFF REPORT  Name/Age/Gender [name] 78 y.o. female  Code Status    Code Status Orders  (From admission, onward)           Start     Ordered   01/19/24 0205  Full code  Continuous       Question:  By:  Answer:  Consent: discussion documented in EHR   01/19/24 0206           Code Status History     Date Active Date Inactive Code Status Order ID Comments User Context   09/14/2023 1729 09/15/2023 1920 Full Code [id number]  Melina Fiddler Inpatient   07/15/2023 1602 07/17/2023 1952 Full Code [id number]  Bobette Mo, MD ED   07/15/2023 1602 07/15/2023 1602 Full Code [id number]  Bobette Mo, MD ED   06/05/2021 1623 06/10/2021 1553 Full Code [id number]  Eduard Clos, MD ED       Home/SNF/Other Home  Chief Complaint Sepsis due to pneumonia (HCC) [J18.9, A41.9]  Level of Care/Admitting Diagnosis ED Disposition     ED Disposition  Admit   Condition  --   Comment  Hospital Area: [facility] [[id number]]  Level of Care: Stepdown [14]  Admit to SDU based on following criteria: Hemodynamic compromise or significant risk of instability:  Patient requiring short term acute titration and management of vasoactive drips, and invasive monitoring (i.e., CVP and Arterial line).  May admit patient to [facility] or [facility] if equivalent level of care is available:: No  Covid Evaluation: Asymptomatic - no recent exposure (last 10 days) testing not required  Diagnosis: Sepsis due to pneumonia [facility]) [[id number]]  Admitting Physician: Tereasa Coop [[id number]]  Attending Physician: Tereasa Coop [[id number]]  Certification:: I certify this patient will need inpatient services for at least 2 midnights  Expected Medical Readiness: 01/24/2024          Medical History Past Medical History:  Diagnosis Date   Anemia    Hx of    Arthritis    Calculus of bile duct without mention of cholecystitis or obstruction     Cataract    GERD (gastroesophageal reflux disease)    H. pylori infection    Hx of    Heart murmur    Hyperlipidemia    Hypertension    Hypothyroid    PUD (peptic ulcer disease)    Rheumatoid arthritis(714.0)     Allergies Allergies  Allergen Reactions   Tape Other (See Comments)    Paper tape ONLY, please!! Not allergic, sensitive   Ace Inhibitors Cough   Ambien [Zolpidem] Other (See Comments)    "Odd feeling"   Crestor [Rosuvastatin] Other (See Comments)    Sore muscles   Leflunomide Other (See Comments)    Elevated LFTs   Pravastatin Other (See Comments)    Sore muscles   Prozac [Fluoxetine Hcl] Other (See Comments)    Decreased libido   Zoloft [Sertraline Hcl] Other (See Comments)    Reaction not recalled    IV Location/Drains/Wounds Patient Lines/Drains/Airways Status     Active Line/Drains/Airways     Name Placement date Placement time Site Days   Peripheral IV 01/19/24 20 G 2.5" Right;Upper Arm 01/19/24  0021  Arm  less than 1            Labs/Imaging Results for orders placed or performed during the hospital encounter of 01/18/24 (from the past 48 hours)  Resp panel by RT-PCR (RSV, Flu A&B, Covid)  Anterior Nasal Swab     Status: None   Collection Time: 01/18/24  9:29 PM   Specimen: Anterior Nasal Swab  Result Value Ref Range   SARS Coronavirus 2 by RT PCR NEGATIVE NEGATIVE    Comment: (NOTE) SARS-CoV-2 target nucleic acids are NOT DETECTED.  The SARS-CoV-2 RNA is generally detectable in upper respiratory specimens during the acute phase of infection. The lowest concentration of SARS-CoV-2 viral copies this assay can detect is 138 copies/mL. A negative result does not preclude SARS-Cov-2 infection and should not be used as the sole basis for treatment or other patient management decisions. A negative result may occur with  improper specimen collection/handling, submission of specimen other than nasopharyngeal swab, presence of viral mutation(s)  within the areas targeted by this assay, and inadequate number of viral copies(<138 copies/mL). A negative result must be combined with clinical observations, patient history, and epidemiological information. The expected result is Negative.  Fact Sheet for Patients:  BloggerCourse.com  Fact Sheet for Healthcare Providers:  SeriousBroker.it  This test is no t yet approved or cleared by the [place] FDA and  has been authorized for detection and/or diagnosis of SARS-CoV-2 by FDA under an Emergency Use Authorization (EUA). This EUA will remain  in effect (meaning this test can be used) for the duration of the COVID-19 declaration under Section 564(b)(1) of the Act, 21 U.S.C.section 360bbb-3(b)(1), unless the authorization is terminated  or revoked sooner.       Influenza A by PCR NEGATIVE NEGATIVE   Influenza B by PCR NEGATIVE NEGATIVE    Comment: (NOTE) The Xpert Xpress SARS-CoV-2/FLU/RSV plus assay is intended as an aid in the diagnosis of influenza from Nasopharyngeal swab specimens and should not be used as a sole basis for treatment. Nasal washings and aspirates are unacceptable for Xpert Xpress SARS-CoV-2/FLU/RSV testing.  Fact Sheet for Patients: BloggerCourse.com  Fact Sheet for Healthcare Providers: SeriousBroker.it  This test is not yet approved or cleared by the [place] FDA and has been authorized for detection and/or diagnosis of SARS-CoV-2 by FDA under an Emergency Use Authorization (EUA). This EUA will remain in effect (meaning this test can be used) for the duration of the COVID-19 declaration under Section 564(b)(1) of the Act, 21 U.S.C. section 360bbb-3(b)(1), unless the authorization is terminated or revoked.     Resp Syncytial Virus by PCR NEGATIVE NEGATIVE    Comment: (NOTE) Fact Sheet for  Patients: BloggerCourse.com  Fact Sheet for Healthcare Providers: SeriousBroker.it  This test is not yet approved or cleared by the [place] FDA and has been authorized for detection and/or diagnosis of SARS-CoV-2 by FDA under an Emergency Use Authorization (EUA). This EUA will remain in effect (meaning this test can be used) for the duration of the COVID-19 declaration under Section 564(b)(1) of the Act, 21 U.S.C. section 360bbb-3(b)(1), unless the authorization is terminated or revoked.  Performed at [facility], [address], [place], [place] [address]   Respiratory (~20 pathogens) panel by PCR     Status: None   Collection Time: 01/18/24  9:29 PM   Specimen: Nasopharyngeal Swab; Respiratory  Result Value Ref Range   Adenovirus NOT DETECTED NOT DETECTED   Coronavirus 229E NOT DETECTED NOT DETECTED    Comment: (NOTE) The Coronavirus on the Respiratory Panel, DOES NOT test for the novel  Coronavirus (2019 nCoV)    Coronavirus HKU1 NOT DETECTED NOT DETECTED   Coronavirus NL63 NOT DETECTED NOT DETECTED   Coronavirus OC43 NOT DETECTED NOT DETECTED  Metapneumovirus NOT DETECTED NOT DETECTED   Rhinovirus / Enterovirus NOT DETECTED NOT DETECTED   Influenza A NOT DETECTED NOT DETECTED   Influenza B NOT DETECTED NOT DETECTED   Parainfluenza Virus 1 NOT DETECTED NOT DETECTED   Parainfluenza Virus 2 NOT DETECTED NOT DETECTED   Parainfluenza Virus 3 NOT DETECTED NOT DETECTED   Parainfluenza Virus 4 NOT DETECTED NOT DETECTED   Respiratory Syncytial Virus NOT DETECTED NOT DETECTED   Bordetella pertussis NOT DETECTED NOT DETECTED   Bordetella Parapertussis NOT DETECTED NOT DETECTED   Chlamydophila pneumoniae NOT DETECTED NOT DETECTED   Mycoplasma pneumoniae NOT DETECTED NOT DETECTED    Comment: Performed at [facility], [address] [place], [place] [address]  CBC with Differential/Platelet      Status: Abnormal   Collection Time: 01/18/24 10:28 PM  Result Value Ref Range   WBC 1.1 (LL) 4.0 - 10.5 K/uL    Comment: REPEATED TO VERIFY THIS CRITICAL RESULT HAS VERIFIED AND BEEN CALLED TO D. MONROE, RN BY JENNIFER COLE ON 03 12 2025 AT 2336, AND HAS BEEN READ BACK.     RBC 2.76 (L) 3.87 - 5.11 MIL/uL   Hemoglobin 8.1 (L) 12.0 - 15.0 g/dL   HCT 91.4 (L) 78.2 - 95.6 %   MCV 92.0 80.0 - 100.0 fL   MCH 29.3 26.0 - 34.0 pg   MCHC 31.9 30.0 - 36.0 g/dL   RDW 21.3 (H) 08.6 - 57.8 %   Platelets 15 (LL) 150 - 400 K/uL    Comment: Immature Platelet Fraction may be clinically indicated, consider ordering this additional test [id number] REPEATED TO VERIFY PLATELET COUNT CONFIRMED BY SMEAR THIS CRITICAL RESULT HAS VERIFIED AND BEEN CALLED TO D. MONROE, RN BY JENNIFER COLE ON 03 12 2025 AT 2336, AND HAS BEEN READ BACK.     nRBC 10.5 (H) 0.0 - 0.2 %   Neutrophils Relative % 9 %   Neutro Abs 0.1 (LL) 1.7 - 7.7 K/uL    Comment: This critical result has verified and been called to D. MONROE, RN by Lucendia Herrlich on 03 12 2025 at 2336, and has been read back.    Lymphocytes Relative 43 %   Lymphs Abs 0.5 (L) 0.7 - 4.0 K/uL   Monocytes Relative 6 %   Monocytes Absolute 0.1 0.1 - 1.0 K/uL   Eosinophils Relative 41 %   Eosinophils Absolute 0.4 0.0 - 0.5 K/uL   Basophils Relative 1 %   Basophils Absolute 0.0 0.0 - 0.1 K/uL   WBC Morphology MORPHOLOGY UNREMARKABLE    Smear Review MORPHOLOGY UNREMARKABLE     Comment: PLATELET COUNT CONFIRMED BY SMEAR   Immature Granulocytes 0 %   Abs Immature Granulocytes 0.00 0.00 - 0.07 K/uL   Polychromasia PRESENT     Comment: Performed at [facility], [address] [place], [place] [address]  Comprehensive metabolic panel     Status: Abnormal   Collection Time: 01/18/24 10:28 PM  Result Value Ref Range   Sodium 132 (L) 135 - 145 mmol/L   Potassium 3.0 (L) 3.5 - 5.1 mmol/L   Chloride 96 (L) 98 - 111 mmol/L   CO2 24 22 - 32 mmol/L    Glucose, Bld 119 (H) 70 - 99 mg/dL    Comment: Glucose reference range applies only to samples taken after fasting for at least 8 hours.   BUN 8 8 - 23 mg/dL   Creatinine, Ser 4.40 0.44 - 1.00 mg/dL   Calcium 8.8 (  L) 8.9 - 10.3 mg/dL   Total Protein 5.1 (L) 6.5 - 8.1 g/dL   Albumin 2.3 (L) 3.5 - 5.0 g/dL   AST 43 (H) 15 - 41 U/L   ALT 32 0 - 44 U/L   Alkaline Phosphatase 119 38 - 126 U/L   Total Bilirubin 1.8 (H) 0.0 - 1.2 mg/dL   GFR, Estimated >16 >10 mL/min    Comment: (NOTE) Calculated using the CKD-EPI Creatinine Equation (2021)    Anion gap 12 5 - 15    Comment: Performed at [facility], [address] [place], [place] [address]  Troponin I (High Sensitivity)     Status: Abnormal   Collection Time: 01/18/24 10:28 PM  Result Value Ref Range   Troponin I (High Sensitivity) 294 (HH) <18 ng/L    Comment: CRITICAL RESULT CALLED TO, READ BACK BY AND VERIFIED WITH LAEVITA, H RN @ 2318 ON 01/18/2024 BY MTA (NOTE) Elevated high sensitivity troponin I (hsTnI) values and significant  changes across serial measurements may suggest ACS but many other  chronic and acute conditions are known to elevate hsTnI results.  Refer to the "Links" section for chest pain algorithms and additional  guidance. Performed at [facility], [address] [place], [place] [address]   D-dimer, quantitative     Status: Abnormal   Collection Time: 01/18/24 11:18 PM  Result Value Ref Range   D-Dimer, Quant 2.19 (H) 0.00 - 0.50 ug/mL-FEU    Comment: (NOTE) At the manufacturer cut-off value of 0.5 g/mL FEU, this assay has a negative predictive value of 95-100%.This assay is intended for use in conjunction with a clinical pretest probability (PTP) assessment model to exclude pulmonary embolism (PE) and deep venous thrombosis (DVT) in outpatients suspected of PE or DVT. Results should be correlated with clinical presentation. Performed at [facility], [address], [place], [place] [address]   Blood culture (routine x 2)     Status: None (Preliminary result)   Collection Time: 01/19/24 12:05 AM   Specimen: BLOOD  Result Value Ref Range   Specimen Description      BLOOD RIGHT ANTECUBITAL Performed at [facility], [address] [place], [place] [address]    Special Requests      BOTTLES DRAWN AEROBIC AND ANAEROBIC Blood Culture results may not be optimal due to an inadequate volume of blood received in culture bottles Performed at [facility], [address], [place], [place] [address]    Culture  Setup Time      GRAM POSITIVE COCCI IN BOTH AEROBIC AND ANAEROBIC BOTTLES CRITICAL RESULT CALLED TO, READ BACK BY AND VERIFIED WITH: [facility] ED PARMEDIC Candyce Churn [id number] AT 1945, ADC Performed at [facility], [address], [place], [place] [address]    Culture GRAM POSITIVE COCCI    Report Status PENDING   Blood Culture ID Panel (Reflexed)     Status: Abnormal   Collection Time: 01/19/24 12:05 AM  Result Value Ref Range   Enterococcus faecalis NOT DETECTED NOT DETECTED   Enterococcus Faecium NOT DETECTED NOT DETECTED   Listeria monocytogenes NOT DETECTED NOT DETECTED   Staphylococcus species DETECTED (A) NOT DETECTED    Comment: CRITICAL RESULT CALLED TO, READ BACK BY AND VERIFIED WITH: [facility] ED PARMEDIC KATIE L. [id number] AT 1945, ADC    Staphylococcus aureus (BCID) NOT DETECTED NOT DETECTED   Staphylococcus epidermidis NOT DETECTED NOT DETECTED   Staphylococcus lugdunensis NOT DETECTED  NOT DETECTED   Streptococcus species NOT DETECTED NOT DETECTED   Streptococcus agalactiae NOT DETECTED NOT DETECTED   Streptococcus pneumoniae NOT DETECTED NOT DETECTED   Streptococcus pyogenes NOT DETECTED NOT DETECTED   A.calcoaceticus-baumannii NOT DETECTED NOT DETECTED   Bacteroides fragilis NOT DETECTED NOT DETECTED   Enterobacterales NOT DETECTED NOT DETECTED   Enterobacter cloacae complex  NOT DETECTED NOT DETECTED   Escherichia coli NOT DETECTED NOT DETECTED   Klebsiella aerogenes NOT DETECTED NOT DETECTED   Klebsiella oxytoca NOT DETECTED NOT DETECTED   Klebsiella pneumoniae NOT DETECTED NOT DETECTED   Proteus species NOT DETECTED NOT DETECTED   Salmonella species NOT DETECTED NOT DETECTED   Serratia marcescens NOT DETECTED NOT DETECTED   Haemophilus influenzae NOT DETECTED NOT DETECTED   Neisseria meningitidis NOT DETECTED NOT DETECTED   Pseudomonas aeruginosa NOT DETECTED NOT DETECTED   Stenotrophomonas maltophilia NOT DETECTED NOT DETECTED   Candida albicans NOT DETECTED NOT DETECTED   Candida auris NOT DETECTED NOT DETECTED   Candida glabrata NOT DETECTED NOT DETECTED   Candida krusei NOT DETECTED NOT DETECTED   Candida parapsilosis NOT DETECTED NOT DETECTED   Candida tropicalis NOT DETECTED NOT DETECTED   Cryptococcus neoformans/gattii NOT DETECTED NOT DETECTED    Comment: Performed at [facility], [address], [place], [place] [address]  DIC Panel ONCE - STAT     Status: Abnormal   Collection Time: 01/19/24 12:25 AM  Result Value Ref Range   Prothrombin Time 14.5 11.4 - 15.2 seconds   INR 1.1 0.8 - 1.2    Comment: (NOTE) INR goal varies based on device and disease states.    aPTT 34 24 - 36 seconds   Fibrinogen 493 (H) 210 - 475 mg/dL    Comment: (NOTE) Fibrinogen results may be underestimated in patients receiving thrombolytic therapy.    D-Dimer, Quant 2.32 (H) 0.00 - 0.50 ug/mL-FEU    Comment: (NOTE) At the manufacturer cut-off value of 0.5 g/mL FEU, this assay has a negative predictive value of 95-100%.This assay is intended for use in conjunction with a clinical pretest probability (PTP) assessment model to exclude pulmonary embolism (PE) and deep venous thrombosis (DVT) in outpatients suspected of PE or DVT. Results should be correlated with clinical presentation.    Platelets 24 (LL) 150 - 400 K/uL    Comment: Immature Platelet  Fraction may be clinically indicated, consider ordering this additional test [id number] CONSISTENT WITH PREVIOUS RESULT CRITICAL VALUE NOTED.  VALUE IS CONSISTENT WITH PREVIOUSLY REPORTED AND CALLED VALUE. REPEATED TO VERIFY    Smear Review NO SCHISTOCYTES SEEN     Comment: Performed at [facility], [address], [place], [place] [address]  I-Stat CG4 Lactic Acid     Status: None   Collection Time: 01/19/24 12:34 AM  Result Value Ref Range   Lactic Acid, Venous 1.7 0.5 - 1.9 mmol/L  Type and screen [facility]     Status: None (Preliminary result)   Collection Time: 01/19/24  2:27 AM  Result Value Ref Range   ABO/RH(D) AB POS    Antibody Screen NEG    Sample Expiration 01/22/2024,2359    Unit Number [id number]    Blood Component Type RED CELLS,LR    Unit division 00    Status of Unit ALLOCATED    Transfusion Status OK TO TRANSFUSE    Crossmatch Result Compatible    Unit Number [id number]    Blood Component Type RED CELLS,LR    Unit division  00    Status of Unit ISSUED    Transfusion Status OK TO TRANSFUSE    Crossmatch Result      Compatible Performed at [facility], [address], [place], [place] [address]   Prepare RBC (crossmatch)     Status: None   Collection Time: 01/19/24  2:27 AM  Result Value Ref Range   Order Confirmation      ORDER PROCESSED BY BLOOD BANK Performed at [facility], [address] [place], [place] [address]   Prepare platelet pheresis     Status: None (Preliminary result)   Collection Time: 01/19/24  2:30 AM  Result Value Ref Range   Unit Number [id number]    Blood Component Type PLTP2 PSORALEN TREATED    Unit division 00    Status of Unit ISSUED    Transfusion Status      OK TO TRANSFUSE Performed at [facility], [address] [place], [place] [address]    Unit Number [id number]    Blood Component Type PLTP1 PSORALEN  TREATED    Unit division 00    Status of Unit ALLOCATED    Transfusion Status OK TO TRANSFUSE   MRSA Next Gen by PCR, Nasal     Status: None   Collection Time: 01/19/24  4:07 AM   Specimen: Nasal Mucosa; Nasal Swab  Result Value Ref Range   MRSA by PCR Next Gen NOT DETECTED NOT DETECTED    Comment: (NOTE) The GeneXpert MRSA Assay (FDA approved for NASAL specimens only), is one component of a comprehensive MRSA colonization surveillance program. It is not intended to diagnose MRSA infection nor to guide or monitor treatment for MRSA infections. Test performance is not FDA approved in patients less than 80 years old. Performed at [facility], [address], [place], [place] [address]   Protime-INR     Status: None   Collection Time: 01/19/24  6:51 AM  Result Value Ref Range   Prothrombin Time 15.2 11.4 - 15.2 seconds   INR 1.2 0.8 - 1.2    Comment: (NOTE) INR goal varies based on device and disease states. Performed at [facility], [address], [place], [place] [address]   APTT     Status: Abnormal   Collection Time: 01/19/24  6:51 AM  Result Value Ref Range   aPTT 37 (H) 24 - 36 seconds    Comment:        IF BASELINE aPTT IS ELEVATED, SUGGEST PATIENT RISK ASSESSMENT BE USED TO DETERMINE APPROPRIATE ANTICOAGULANT THERAPY. Performed at [facility], [address], [place], [place] [address]   Comprehensive metabolic panel     Status: Abnormal   Collection Time: 01/19/24  6:51 AM  Result Value Ref Range   Sodium 129 (L) 135 - 145 mmol/L   Potassium 4.0 3.5 - 5.1 mmol/L   Chloride 97 (L) 98 - 111 mmol/L   CO2 25 22 - 32 mmol/L   Glucose, Bld 80 70 - 99 mg/dL    Comment: Glucose reference range applies only to samples taken after fasting for at least 8 hours.   BUN 8 8 - 23 mg/dL   Creatinine, Ser 3.55 0.44 - 1.00 mg/dL   Calcium 8.3 (L) 8.9 - 10.3 mg/dL   Total Protein 4.4 (L) 6.5 - 8.1 g/dL   Albumin 1.9  (L) 3.5 - 5.0 g/dL   AST 28 15 - 41 U/L   ALT 26  0 - 44 U/L   Alkaline Phosphatase 88 38 - 126 U/L   Total Bilirubin 1.9 (H) 0.0 - 1.2 mg/dL   GFR, Estimated >78 >29 mL/min    Comment: (NOTE) Calculated using the CKD-EPI Creatinine Equation (2021)    Anion gap 7 5 - 15    Comment: Performed at [facility], [address] [place], [place] [address]  Troponin I (High Sensitivity)     Status: Abnormal   Collection Time: 01/19/24  6:51 AM  Result Value Ref Range   Troponin I (High Sensitivity) 357 (HH) <18 ng/L    Comment: DELTA CHECK NOTED CRITICAL VALUE NOTED. VALUE IS CONSISTENT WITH PREVIOUSLY REPORTED/CALLED VALUE (NOTE) Elevated high sensitivity troponin I (hsTnI) values and significant  changes across serial measurements may suggest ACS but many other  chronic and acute conditions are known to elevate hsTnI results.  Refer to the "Links" section for chest pain algorithms and additional  guidance. Performed at [facility], [address], [place], [place] [address]   CBC     Status: Abnormal   Collection Time: 01/19/24  8:35 AM  Result Value Ref Range   WBC 0.7 (LL) 4.0 - 10.5 K/uL    Comment: CRITICAL VALUE NOTED.  VALUE IS CONSISTENT WITH PREVIOUSLY REPORTED AND CALLED VALUE. REPEATED TO VERIFY THIS CRITICAL RESULT HAS VERIFIED AND BEEN CALLED TO KISER,C RN BY GOLSON,M ON 03 13 2025 AT 0901, AND HAS BEEN READ BACK. CRITICAL RESULT VERIFIED    RBC 2.15 (L) 3.87 - 5.11 MIL/uL   Hemoglobin 6.4 (LL) 12.0 - 15.0 g/dL    Comment: REPEATED TO VERIFY THIS CRITICAL RESULT HAS VERIFIED AND BEEN CALLED TO KISER,C RN BY GOLSON,M ON 03 13 2025 AT 0901, AND HAS BEEN READ BACK. CRITICAL RESULT VERIFIED    HCT 19.6 (L) 36.0 - 46.0 %   MCV 91.2 80.0 - 100.0 fL   MCH 29.8 26.0 - 34.0 pg   MCHC 32.7 30.0 - 36.0 g/dL   RDW 84.6 (H) 96.2 - 95.2 %   Platelets 41 (L) 150 - 400 K/uL    Comment: SPECIMEN CHECKED FOR CLOTS Immature Platelet Fraction  may be clinically indicated, consider ordering this additional test [id number] REPEATED TO VERIFY PLATELET COUNT CONFIRMED BY SMEAR    nRBC 16.4 (H) 0.0 - 0.2 %    Comment: Performed at [facility], [address], [place], [place] [address]  Troponin I (High Sensitivity)     Status: Abnormal   Collection Time: 01/19/24  8:35 AM  Result Value Ref Range   Troponin I (High Sensitivity) 364 (HH) <18 ng/L    Comment: DELTA CHECK NOTED CRITICAL VALUE NOTED. VALUE IS CONSISTENT WITH PREVIOUSLY REPORTED/CALLED VALUE (NOTE) Elevated high sensitivity troponin I (hsTnI) values and significant  changes across serial measurements may suggest ACS but many other  chronic and acute conditions are known to elevate hsTnI results.  Refer to the "Links" section for chest pain algorithms and additional  guidance. Performed at [facility], [address] [place], [place] [address]   Reticulocytes     Status: Abnormal   Collection Time: 01/19/24  8:35 AM  Result Value Ref Range   Retic Ct Pct 4.7 (H) 0.4 - 3.1 %   RBC. 2.16 (L) 3.87 - 5.11 MIL/uL   Retic Count, Absolute 102.0 19.0 - 186.0 K/uL   Immature Retic Fract 43.2 (H) 2.3 - 15.9 %    Comment: Performed at [facility], [address], [place], [place] [address]  Prepare RBC (crossmatch)     Status: None   Collection Time: 01/19/24  9:19 AM  Result Value Ref Range   Order Confirmation      BB SAMPLE OR UNITS ALREADY AVAILABLE Performed at [facility], [address] [place], [place] [address]    VAS [place] LOWER EXTREMITY VENOUS (DVT) Result Date: 01/19/2024  Lower Venous DVT Study Patient Name:  [name]  Date of Exam:   01/19/2024 Medical Rec #: [id number]           Accession #:    [id number] Date of Birth: 1946/02/21            Patient Gender: F Patient Age:   5 years Exam Location:  [facility] Procedure:      VAS [place] LOWER EXTREMITY VENOUS (DVT)  Referring Phys: Tereasa Coop --------------------------------------------------------------------------------  Indications: Swelling, Edema, SOB, pulmonary embolism, and Weakness, right sided chest pain, knee replacement surgery in 2024.  Risk Factors: Confirmed PE. Limitations: Involuntary movements. Comparison Study: No prior exam. Performing Technologist: Fernande Bras  Examination Guidelines: A complete evaluation includes B-mode imaging, spectral Doppler, color Doppler, and power Doppler as needed of all accessible portions of each vessel. Bilateral testing is considered an integral part of a complete examination. Limited examinations for reoccurring indications may be performed as noted. The reflux portion of the exam is performed with the patient in reverse Trendelenburg.  +---------+---------------+---------+-----------+----------+--------------+ RIGHT    CompressibilityPhasicitySpontaneityPropertiesThrombus Aging +---------+---------------+---------+-----------+----------+--------------+ CFV      Full           No       Yes                                 +---------+---------------+---------+-----------+----------+--------------+ SFJ      Full           Yes      Yes                                 +---------+---------------+---------+-----------+----------+--------------+ FV Prox  Full                                                        +---------+---------------+---------+-----------+----------+--------------+ FV Mid   Full                                                        +---------+---------------+---------+-----------+----------+--------------+ FV DistalFull                                                        +---------+---------------+---------+-----------+----------+--------------+ PFV      Full                                                        +---------+---------------+---------+-----------+----------+--------------+  POP       None           No       No                                  +---------+---------------+---------+-----------+----------+--------------+ PTV      Full                                                        +---------+---------------+---------+-----------+----------+--------------+ PERO     None           No       No                                  +---------+---------------+---------+-----------+----------+--------------+ Gastroc  None           No       No                                  +---------+---------------+---------+-----------+----------+--------------+   +---------+---------------+---------+-----------+----------+-------------------+ LEFT     CompressibilityPhasicitySpontaneityPropertiesThrombus Aging      +---------+---------------+---------+-----------+----------+-------------------+ CFV      Full           Yes      Yes                                      +---------+---------------+---------+-----------+----------+-------------------+ SFJ      Full           Yes      Yes                                      +---------+---------------+---------+-----------+----------+-------------------+ FV Prox  Full                                                             +---------+---------------+---------+-----------+----------+-------------------+ FV Mid   Full                                                             +---------+---------------+---------+-----------+----------+-------------------+ FV DistalFull                                                             +---------+---------------+---------+-----------+----------+-------------------+ PFV      Full                                                             +---------+---------------+---------+-----------+----------+-------------------+  POP      None           No       No                                        +---------+---------------+---------+-----------+----------+-------------------+ PTV                                                   Not well seen,                                                            patent by color.    +---------+---------------+---------+-----------+----------+-------------------+ PERO                                                  Not well seen,                                                            patent by color.    +---------+---------------+---------+-----------+----------+-------------------+ Gastroc  None           No       No                                       +---------+---------------+---------+-----------+----------+-------------------+     Summary: RIGHT: - Findings consistent with acute deep vein thrombosis involving the right popliteal vein, and right peroneal veins. Findings consistent with acute intramuscular thrombosis involving the right gastrocnemius veins. - No cystic structure found in the popliteal fossa.  LEFT: - Findings consistent with acute deep vein thrombosis involving the left popliteal vein. Findings consistent with acute intramuscular thrombosis involving the left gastrocnemius veins. - No cystic structure found in the popliteal fossa.  *See table(s) above for measurements and observations. Electronically signed by Gerarda Fraction on 01/19/2024 at 11:23:25 AM.    Final    CT Angio Chest PE W and/or Wo Contrast Result Date: 01/19/2024 CLINICAL DATA:  Pulmonary embolism (PE) suspected, high prob; Abdominal pain, acute, nonlocalized. Neutropenia, dyspnea, cough, melena, elevated D-dimer EXAM: CT ANGIOGRAPHY CHEST CT ABDOMEN AND PELVIS WITH CONTRAST TECHNIQUE: Multidetector CT imaging of the chest was performed using the standard protocol during bolus administration of intravenous contrast. Multiplanar CT image reconstructions and MIPs were obtained to evaluate the vascular anatomy. Multidetector CT imaging of the  abdomen and pelvis was performed using the standard protocol during bolus administration of intravenous contrast. RADIATION DOSE REDUCTION: This exam was performed according to the departmental dose-optimization program which includes automated exposure control, adjustment of the mA and/or kV according to patient size and/or use of iterative reconstruction technique. CONTRAST:  OMNIPAQUE IOHEXOL 350  MG/ML SOLN COMPARISON:  None Available. FINDINGS: CTA CHEST FINDINGS Cardiovascular: Mood there is adequate opacification of the pulmonary arterial tree. There are multiple intraluminal filling defects identified within multiple segmental pulmonary arteries of the right upper lobe, right lower lobe left lobe keeping with changes of acute pulmonary embolus. The embolic burden is moderate. The central pulmonary arteries are enlarged in keeping with changes of pulmonary arterial hypertension. There is, however, no CT evidence of right heart strain. Moderate left anterior descending coronary artery calcification. Global cardiac size is mildly enlarged. No pericardial effusion. Mild atherosclerotic calcification within the thoracic aorta. No aortic aneurysm. Mediastinum/Nodes: Shotty mediastinal adenopathy may be reactive in nature. No frankly pathologic thoracic adenopathy. 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 Esophagus unremarkable. Lungs/Pleura: There is extensive reticulonodular infiltrate throughout the lungs bilaterally with more focal consolidation within right lower lobe, possibly related to extensive pneumonic consolidation with superimposed infiltrate related to pulmonary infarct. Small right and trace left pleural effusions are present. No pneumothorax. Musculoskeletal: Osseous structures are age-appropriate. No acute bone abnormality. Review of the MIP images confirms the above findings. CT ABDOMEN and PELVIS FINDINGS Hepatobiliary: Multiple cysts are seen scattered throughout the liver. Status post  cholecystectomy. No intra or extrahepatic biliary ductal dilation. Pancreas: Unremarkable Spleen: Unremarkable Adrenals/Urinary Tract: Adrenal glands are unremarkable. Kidneys are normal, without renal calculi, focal lesion, or hydronephrosis. Bladder is unremarkable. 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 Moderate sigmoid diverticulosis. Stomach, small bowel, and large bowel are otherwise unremarkable. Mild ascites. No free intraperitoneal gas. Vascular/Lymphatic: Aortic atherosclerosis. No enlarged abdominal or pelvic lymph nodes. Reproductive: Status post hysterectomy. No adnexal masses. Other: No abdominal wall hernia Musculoskeletal: Status post left hip ORIF. Degenerative changes are seen within the lumbar spine. No acute bone abnormality. No lytic or blastic bone lesion. Review of the MIP images confirms the above findings. IMPRESSION: 1. Acute pulmonary embolus with moderate embolic burden. No CT evidence of right heart strain. 2. Extensive reticulonodular infiltrate throughout the lungs bilaterally with more focal consolidation within the right lower lobe, possibly related to extensive pneumonic consolidation with superimposed infiltrate related to pulmonary infarct. 3. Small right and trace left pleural effusions. 4. Moderate left anterior descending coronary artery calcification. Mild cardiomegaly. 5. Mild ascites. 6. Moderate sigmoid diverticulosis. 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 Aortic Atherosclerosis (ICD10-I70.0). Electronically Signed   By: Helyn Numbers M.D.   On: 01/19/2024 02:32   CT ABDOMEN PELVIS W CONTRAST Result Date: 01/19/2024 CLINICAL DATA:  Pulmonary embolism (PE) suspected, high prob; Abdominal pain, acute, nonlocalized. Neutropenia, dyspnea, cough, melena, elevated D-dimer EXAM: CT ANGIOGRAPHY CHEST CT ABDOMEN AND PELVIS WITH CONTRAST TECHNIQUE: Multidetector CT imaging of the chest was performed using the standard protocol  during bolus administration of intravenous contrast. Multiplanar CT image reconstructions and MIPs were obtained to evaluate the vascular anatomy. Multidetector CT imaging of the abdomen and pelvis was performed using the standard protocol during bolus administration of intravenous contrast. RADIATION DOSE REDUCTION: This exam was performed according to the departmental dose-optimization program which includes automated exposure control, adjustment of the mA and/or kV according to patient size and/or use of iterative reconstruction technique. CONTRAST:  OMNIPAQUE IOHEXOL 350 MG/ML SOLN COMPARISON:  None Available. FINDINGS: CTA CHEST FINDINGS Cardiovascular: Mood there is adequate opacification of the pulmonary arterial tree. There are multiple intraluminal filling defects identified within multiple segmental pulmonary arteries of the right upper lobe, right lower lobe left lobe keeping with changes of acute pulmonary embolus. The embolic burden is moderate. The central pulmonary  arteries are enlarged in keeping with changes of pulmonary arterial hypertension. There is, however, no CT evidence of right heart strain. Moderate left anterior descending coronary artery calcification. Global cardiac size is mildly enlarged. No pericardial effusion. Mild atherosclerotic calcification within the thoracic aorta. No aortic aneurysm. Mediastinum/Nodes: Shotty mediastinal adenopathy may be reactive in nature. No frankly pathologic thoracic adenopathy. 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 Esophagus unremarkable. Lungs/Pleura: There is extensive reticulonodular infiltrate throughout the lungs bilaterally with more focal consolidation within right lower lobe, possibly related to extensive pneumonic consolidation with superimposed infiltrate related to pulmonary infarct. Small right and trace left pleural effusions are present. No pneumothorax. Musculoskeletal: Osseous structures are age-appropriate. No acute bone  abnormality. Review of the MIP images confirms the above findings. CT ABDOMEN and PELVIS FINDINGS Hepatobiliary: Multiple cysts are seen scattered throughout the liver. Status post cholecystectomy. No intra or extrahepatic biliary ductal dilation. Pancreas: Unremarkable Spleen: Unremarkable Adrenals/Urinary Tract: Adrenal glands are unremarkable. Kidneys are normal, without renal calculi, focal lesion, or hydronephrosis. Bladder is unremarkable. 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 Moderate sigmoid diverticulosis. Stomach, small bowel, and large bowel are otherwise unremarkable. Mild ascites. No free intraperitoneal gas. Vascular/Lymphatic: Aortic atherosclerosis. No enlarged abdominal or pelvic lymph nodes. Reproductive: Status post hysterectomy. No adnexal masses. Other: No abdominal wall hernia Musculoskeletal: Status post left hip ORIF. Degenerative changes are seen within the lumbar spine. No acute bone abnormality. No lytic or blastic bone lesion. Review of the MIP images confirms the above findings. IMPRESSION: 1. Acute pulmonary embolus with moderate embolic burden. No CT evidence of right heart strain. 2. Extensive reticulonodular infiltrate throughout the lungs bilaterally with more focal consolidation within the right lower lobe, possibly related to extensive pneumonic consolidation with superimposed infiltrate related to pulmonary infarct. 3. Small right and trace left pleural effusions. 4. Moderate left anterior descending coronary artery calcification. Mild cardiomegaly. 5. Mild ascites. 6. Moderate sigmoid diverticulosis. 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 Aortic Atherosclerosis (ICD10-I70.0). Electronically Signed   By: Helyn Numbers M.D.   On: 01/19/2024 02:32   CT Head Wo Contrast Result Date: 01/19/2024 CLINICAL DATA:  Altered mental status EXAM: CT HEAD WITHOUT CONTRAST TECHNIQUE: Contiguous axial images were obtained from the base of the  skull through the vertex without intravenous contrast. RADIATION DOSE REDUCTION: This exam was performed according to the departmental dose-optimization program which includes automated exposure control, adjustment of the mA and/or kV according to patient size and/or use of iterative reconstruction technique. COMPARISON:  09/20/2005 FINDINGS: Brain: There is no mass, hemorrhage or extra-axial collection. There is generalized atrophy without lobar predilection. Hypodensity of the white matter is most commonly associated with chronic microvascular disease. Vascular: Atherosclerotic calcification of the internal carotid arteries at the skull base. No abnormal hyperdensity of the major intracranial arteries or dural venous sinuses. Skull: The visualized skull base, calvarium and extracranial soft tissues are normal. Sinuses/Orbits: No fluid levels or advanced mucosal thickening of the visualized paranasal sinuses. No mastoid or middle ear effusion. Normal orbits. Other: None. IMPRESSION: 1. No acute intracranial abnormality. 2. Generalized atrophy and findings of chronic microvascular disease. Electronically Signed   By: Deatra Robinson M.D.   On: 01/19/2024 02:01   DG Chest Port 1 View Result Date: 01/18/2024 CLINICAL DATA:  Shortness of breath. EXAM: PORTABLE CHEST 1 VIEW COMPARISON:  07/06/2023 FINDINGS: The heart is enlarged. Diffuse interstitial opacities throughout both lungs with more patchy opacity at the lung bases. No pneumothorax. There may be a right pleural effusion. IMPRESSION: Cardiomegaly  with diffuse interstitial opacities and more patchy opacity at the lung bases, may represent pulmonary edema, multifocal pneumonia, or combination there. Possible right pleural effusion. Electronically Signed   By: Narda Rutherford M.D.   On: 01/18/2024 23:08    Pending Labs Unresulted Labs (From admission, onward)     Start     Ordered   01/25/2024 0500  Magnesium  Tomorrow morning,   R        01/19/24 1348    01/23/2024 0500  Phosphorus  Tomorrow morning,   R        01/19/24 1348   01/07/2024 0500  TSH  Tomorrow morning,   R        01/19/24 1350   01/16/2024 0500  T4, free  Tomorrow morning,   R        01/19/24 1350   01/12/2024 0500  Procalcitonin  Tomorrow morning,   R       References:    Procalcitonin Lower Respiratory Tract Infection AND Sepsis Procalcitonin Algorithm   01/19/24 1350   01/19/24 1800  DIC Panel  Once,   R        01/19/24 1800   01/19/24 1800  Vitamin B12  Once,   AD        01/19/24 1800   01/19/24 1800  Iron and TIBC  Once,   AD        01/19/24 1800   01/19/24 1800  Ferritin  Once,   AD        01/19/24 1800   01/19/24 1800  Folate  Once,   AD        01/19/24 1800   01/19/24 1636  Methylmalonic acid, serum  Once,   R        01/19/24 1635   01/19/24 1636  Multiple Myeloma Panel (SPEP&IFE w/QIG)  Once,   R        01/19/24 1635   01/19/24 1636  Kappa/lambda light chains  Once,   R        01/19/24 1635   01/19/24 0800  CBC  3 times daily,   R (with TIMED occurrences)      01/19/24 0210   01/19/24 0500  Comprehensive metabolic panel  Daily,   R      01/19/24 0331   01/19/24 0207  Expectorated Sputum Assessment w Gram Stain, Rflx to Resp Cult  (COPD / Pneumonia / Cellulitis / Lower Extremity Wound (Diabetic Foot Infection))  Once,   R        01/19/24 0206   01/19/24 0207  Legionella Pneumophila Serogp 1 Ur Ag  (COPD / Pneumonia / Cellulitis / Lower Extremity Wound (Diabetic Foot Infection))  Once,   R        01/19/24 0206   01/19/24 0207  Strep pneumoniae urinary antigen  (COPD / Pneumonia / Cellulitis / Lower Extremity Wound (Diabetic Foot Infection))  Once,   R        01/19/24 0206   01/18/24 2346  Blood culture (routine x 2)  BLOOD CULTURE X 2,   R (with STAT occurrences)      01/18/24 2345   01/18/24 2122  Urinalysis, w/ Reflex to Culture (Infection Suspected) -Urine, Clean Catch  Once,   URGENT       Question:  Specimen Source  Answer:  Urine, Clean Catch   01/18/24 2122    Unscheduled  Occult blood card to lab, stool  As needed,   R  01/19/24 0251            Vitals/Pain Today's Vitals   01/19/24 1608 01/19/24 1615 01/19/24 1634 01/19/24 1926  BP:  (!) 144/82 (!) 144/82 (!) 138/111  Pulse:  (!) 109 (!) 116 (!) 117  Resp:  (!) 30 (!) 24 (!) 27  Temp:  98.5 F (36.9 C) 98.5 F (36.9 C)   TempSrc:   Oral   SpO2:  95%  92%  Weight: 61.2 kg     Height: 5\' 2"  (1.575 m)       Isolation Precautions No active isolations  Medications Medications  0.9 %  sodium chloride infusion (Manually program via Guardrails IV Fluids) (has no administration in time range)  levothyroxine (SYNTHROID) tablet 100 mcg (has no administration in time range)  sodium chloride flush (NS) 0.9 % injection 3 mL (3 mLs Intravenous Given 01/19/24 0922)  sodium chloride flush (NS) 0.9 % injection 3 mL (3 mLs Intravenous Given 01/19/24 0922)  sodium chloride flush (NS) 0.9 % injection 3 mL (has no administration in time range)  0.9 %  sodium chloride infusion (has no administration in time range)  acetaminophen (TYLENOL) tablet 650 mg (has no administration in time range)    Or  acetaminophen (TYLENOL) suppository 650 mg (has no administration in time range)  ondansetron (ZOFRAN) tablet 4 mg (has no administration in time range)    Or  ondansetron (ZOFRAN) injection 4 mg (has no administration in time range)  levalbuterol (XOPENEX) nebulizer solution 0.63 mg (has no administration in time range)  pantoprazole (PROTONIX) injection 40 mg (40 mg Intravenous Given 01/19/24 1030)  guaiFENesin (MUCINEX) 12 hr tablet 600 mg (600 mg Oral Given 01/19/24 1030)  azithromycin (ZITHROMAX) 500 mg in sodium chloride 0.9 % 250 mL IVPB (has no administration in time range)  cefTRIAXone (ROCEPHIN) 2 g in sodium chloride 0.9 % 100 mL IVPB (has no administration in time range)  leptospermum manuka honey (MEDIHONEY) paste 1 Application (has no administration in time range)  0.9 %  sodium chloride  infusion (has no administration in time range)  folic acid (FOLVITE) tablet 1 mg (has no administration in time range)  vancomycin (VANCOCIN) IVPB 1000 mg/200 mL premix (0 mg Intravenous Stopped 01/19/24 0243)  ceFEPIme (MAXIPIME) 2 g in sodium chloride 0.9 % 100 mL IVPB (0 g Intravenous Stopped 01/19/24 0054)  iohexol (OMNIPAQUE) 350 MG/ML injection 100 mL (100 mLs Intravenous Contrast Given 01/19/24 0100)  potassium chloride SA (KLOR-CON M) CR tablet 40 mEq (40 mEq Oral Given 01/19/24 0409)  potassium chloride 10 mEq in 100 mL IVPB (0 mEq Intravenous Stopped 01/19/24 0607)  0.9 %  sodium chloride infusion (Manually program via Guardrails IV Fluids) ( Intravenous New Bag/Given 01/19/24 1234)    Mobility non-ambulatory

## 2024-01-19 NOTE — ED Notes (Addendum)
 Went into pt room to obtain nasal swab, purewick canister is empty not able to obtain urine sample at this time will continue to periodically check.

## 2024-01-19 NOTE — Progress Notes (Signed)
 Pharmacy Antibiotic Note  [name] is a 78 y.o. female admitted on 01/18/2024 with pneumonia.  Pharmacy has been consulted for Cefepime + Vancomycin dosing.  Used reported wt of 130# (59kg) to calculate CrCl ~37ml/hr  Plan: Cefepime 2gm IV q12h Vancomycin 750mg  IV q24h to target AUC 400-550.  Estimated AUC 422. Check MRSA PCR     Temp (24hrs), Avg:99 F (37.2 C), Min:98.6 F (37 C), Max:99.3 F (37.4 C)  Recent Labs  Lab 01/18/24 2228 01/19/24 0034  WBC 1.1*  --   CREATININE 0.79  --   LATICACIDVEN  --  1.7    CrCl cannot be calculated (Unknown ideal weight.).    Allergies  Allergen Reactions   Tape Other (See Comments)    Paper tape ONLY, please!! Not allergic, sensitive   Ace Inhibitors Cough   Ambien [Zolpidem] Other (See Comments)    "Odd feeling"   Crestor [Rosuvastatin] Other (See Comments)    Sore muscles   Leflunomide Other (See Comments)    Elevated LFTs   Pravastatin Other (See Comments)    Sore muscles   Prozac [Fluoxetine Hcl] Other (See Comments)    Decreased libido   Zoloft [Sertraline Hcl] Other (See Comments)    Reaction not recalled    Antimicrobials this admission: 3/13 Cefepime >>  3/13 Vancomycin >>   Dose adjustments this admission:  Microbiology results: 3/13 BCx:  3/12 Resp PCR: negative  3/13 MRSA PCR:   Thank you for allowing pharmacy to be a part of this patient's care.  Junita Push PharmD 01/19/2024 2:31 AM

## 2024-01-19 NOTE — Consult Note (Addendum)
 [facility]  [phone] [phone]    HEMATOLOGY CONSULTATION  PURPOSE OF CONSULTATION/CHIEF COMPLAINT:  Referring MD:  Dr. Janalyn Shy   HPI: [name] is a 78 year old female patient who was brought into the ED due to generalized weakness of approximately 3 weeks.  Blood work done in the ED showed pancytopenia and therefore hematology consult has been requested. Patient is examined and assessed today.  She is ill-appearing and jaundiced and answers minimally.  Multiple family members at bedside who answered all the questions.  Family states she usually uses a rolling walker at home however has been increasingly weak over a 3-week timeframe  to the point they could not get her up into the bathroom.  Denies GI symptoms, denies bloody stool or melena.  Admits to very poor appetite and weight loss.  Family did notice that her skin was becoming increasingly yellow. Medical history significant for rheumatoid arthritis for which she sees Dr. Lendon Colonel.  Has been on Plaquenil which was held in November 2024 status post her knee replacement.  Has been on weekly methotrexate injections for which she took last dose last week Wednesday. Surgical history significant for hip repair in September 2024 with a knee replacement in November 2024. Family history significant for both parents with lung and stomach cancer. Social history includes tobacco use from her 38s to quitting in the late 68s early 66s.  Denies alcohol use.  Denies illicit drug use.  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   ASSESSMENT AND PLAN:  Pancytopenia: Anemia - Hemoglobin is low 6.4 today.  Recommend PRBC transfusion for hemoglobin <8.0. - IV iron studies and nutritional panels are pending at this time.  Replete as necessary. --continue folic acid 1 mg TID PO while in house.  - Monitor CBC with differential closely, at least q 12 hours.   Thrombocytopenia -  Platelets low 41K today.  Transfuse platelets for counts <50 K to bolster Plt high enough to start Heparin  Leukopenia - WBC low 0.7. - likely due to weekly methotrexate for RA.  Last dose given 01/11/2024. - Monitor closely -- no indication for GSCF, would not be as successful in setting of marrow suppression from methotrexate  -Hematology/Dr. Leonides Schanz will be following closely  Acute Pulmonary Embolism/ Bilateral LE DVTs  - CT angio chest done today shows acute PE with moderate embolic burden.  No CT evidence of right heart strain. - Anticoagulation with IV heparin recommended -- can start anticoagulation once Plt are >50. Recommend targeting lower end of PTT/heparin therapeutic level  (PTT approx. 60, Heparin level 0.3)   RA - Patient on weekly methotrexate.  Last dose given last week Wednesday, 01/11/2024 - Sees rheumatology/Dr. Lendon Colonel.  Has been on Plaquenil as well which was held since November 2024.  Sepsis  -May be secondary to PNA - Continue IV antibiotics as ordered - Monitor fever curve  Generalized Weakness - Patient is deconditioned.  Continue IV fluids as ordered.  Continue supportive care.   Past Medical History:  Diagnosis Date   Anemia    Hx of    Arthritis    Calculus of bile duct without mention of cholecystitis or obstruction    Cataract    GERD (gastroesophageal reflux disease)    H. pylori infection    Hx of    Heart murmur    Hyperlipidemia    Hypertension    Hypothyroid    PUD (peptic ulcer disease)  Rheumatoid arthritis(714.0)   :  Past Surgical History:  Procedure Laterality Date   AV FISTULA REPAIR     BLADDER SUSPENSION     BREAST CYST EXCISION Left    BUNIONECTOMY     CARPAL TUNNEL RELEASE     CHOLECYSTECTOMY  09/07/2011   CYSTECTOMY     left breast   ERCP  09/29/2011   Procedure: ENDOSCOPIC RETROGRADE CHOLANGIOPANCREATOGRAPHY (ERCP);  Surgeon: Louis Meckel, MD;  Location: [facility];  Service: Endoscopy;  Laterality: N/A;    INTRAMEDULLARY (IM) NAIL INTERTROCHANTERIC Left 07/15/2023   Procedure: INTRAMEDULLARY (IM) NAIL INTERTROCHANTERIC;  Surgeon: Luci Bank, MD;  Location: [facility];  Service: Orthopedics;  Laterality: Left;   PILONIDAL CYST EXCISION     TENOLYSIS  10/26/2011   Procedure: TENDON SHEATH RELEASE/TENOLYSIS;  Surgeon: Nicki Reaper, MD;  Location: [facility];  Service: Orthopedics;  Laterality: Right;  tenosynovectomy removal superficialis slip right index finger   TONSILLECTOMY     TOTAL KNEE ARTHROPLASTY Left 09/14/2023   Procedure: TOTAL KNEE ARTHROPLASTY;  Surgeon: [name], MD;  Location: [facility];  Service: Orthopedics;  Laterality: Left;   TOTAL SHOULDER REPLACEMENT Right 02/22/2014   Dr. Malka So at [facility] RELEASE     VAGINAL HYSTERECTOMY     ovaries not removed  :  Allergies  Allergen Reactions   Tape Other (See Comments)    Paper tape ONLY, please!! Not allergic, sensitive   Ace Inhibitors Cough   Ambien [Zolpidem] Other (See Comments)    "Odd feeling"   Crestor [Rosuvastatin] Other (See Comments)    Sore muscles   Leflunomide Other (See Comments)    Elevated LFTs   Pravastatin Other (See Comments)    Sore muscles   Prozac [Fluoxetine Hcl] Other (See Comments)    Decreased libido   Zoloft [Sertraline Hcl] Other (See Comments)    Reaction not recalled  :   Family History  Problem Relation Age of Onset   Lung cancer Mother    Stomach cancer Father    Heart attack Father        MI at age 7   Colon cancer Maternal Grandfather    Heart attack Brother        MI at age 18   Prostate cancer Paternal Grandfather    Breast cancer Paternal Aunt   :   Social History   Socioeconomic History   Marital status: Married    Spouse name: Not on file   Number of children: 2   Years of education: Not on file   Highest education level: Not on file  Occupational History   Occupation: retired    Associate Professor: RETIRED    Comment: Retired  Tobacco  Use   Smoking status: Former    Current packs/day: 0.00    Types: Cigarettes    Quit date: 10/25/1995    Years since quitting: 28.2   Smokeless tobacco: Never   Tobacco comments:    Johnnye Lana t1985  Vaping Use   Vaping status: Never Used  Substance and Sexual Activity   Alcohol use: Not Currently    Comment: occasional   Drug use: No   Sexual activity: Yes    Birth control/protection: Post-menopausal  Other Topics Concern   Not on file  Social History Narrative   Are you right handed or left handed? right   Are you currently employed ? no   What is your current occupation?retired   Do you live at home  alone?no   Who lives with you? husband   What type of home do you live in: 1 story or 2 story? 1 story-       Social Drivers of Corporate investment banker Strain: Not on file  Food Insecurity: No Food Insecurity (09/14/2023)   Hunger Vital Sign    Worried About Running Out of Food in the Last Year: Never true    Ran Out of Food in the Last Year: Never true  Transportation Needs: No Transportation Needs (09/14/2023)   PRAPARE - Administrator, Civil Service (Medical): No    Lack of Transportation (Non-Medical): No  Physical Activity: Not on file  Stress: Not on file  Social Connections: Not on file  Intimate Partner Violence: Not At Risk (09/14/2023)   Humiliation, Afraid, Rape, and Kick questionnaire    Fear of Current or Ex-Partner: No    Emotionally Abused: No    Physically Abused: No    Sexually Abused: No  :   CURRENT MEDS: Current Facility-Administered Medications  Medication Dose Route Frequency Provider Last Rate Last Admin   0.9 %  sodium chloride infusion (Manually program via Guardrails IV Fluids)   Intravenous Once Sundil, Subrina, MD       0.9 %  sodium chloride infusion  250 mL Intravenous PRN Sundil, Subrina, MD       0.9 %  sodium chloride infusion   Intravenous Continuous [place], Eric J, DO       acetaminophen (TYLENOL) tablet 650 mg  650 mg  Oral Q6H PRN Janalyn Shy, Subrina, MD       Or   acetaminophen (TYLENOL) suppository 650 mg  650 mg Rectal Q6H PRN Janalyn Shy, Subrina, MD       azithromycin (ZITHROMAX) 500 mg in sodium chloride 0.9 % 250 mL IVPB  500 mg Intravenous Daily [place], Eric J, DO       cefTRIAXone (ROCEPHIN) 2 g in sodium chloride 0.9 % 100 mL IVPB  2 g Intravenous Q24H [place], Eric J, DO       folic acid (FOLVITE) tablet 1 mg  1 mg Oral Daily Sundil, Subrina, MD       guaiFENesin (MUCINEX) 12 hr tablet 600 mg  600 mg Oral BID Sundil, Subrina, MD   600 mg at 01/19/24 1030   leptospermum manuka honey (MEDIHONEY) paste 1 Application  1 Application Topical Daily [place], Eric J, DO       levalbuterol Pauline Aus) nebulizer solution 0.63 mg  0.63 mg Nebulization Q6H PRN Sundil, Subrina, MD       levothyroxine (SYNTHROID) tablet 100 mcg  100 mcg Oral Daily Sundil, Subrina, MD       ondansetron [facility]) tablet 4 mg  4 mg Oral Q6H PRN Janalyn Shy, Subrina, MD       Or   ondansetron (ZOFRAN) injection 4 mg  4 mg Intravenous Q6H PRN Janalyn Shy, Subrina, MD       pantoprazole (PROTONIX) injection 40 mg  40 mg Intravenous Q12H Sundil, Subrina, MD   40 mg at 01/19/24 1030   sodium chloride flush (NS) 0.9 % injection 3 mL  3 mL Intravenous Q12H Sundil, Subrina, MD   3 mL at 01/19/24 1610   sodium chloride flush (NS) 0.9 % injection 3 mL  3 mL Intravenous Q12H Sundil, Subrina, MD   3 mL at 01/19/24 9604   sodium chloride flush (NS) 0.9 % injection 3 mL  3 mL Intravenous PRN Tereasa Coop, MD  Current Outpatient Medications  Medication Sig Dispense Refill   alendronate (FOSAMAX) 70 MG tablet Take 1 tablet once weekly with a full glass of water on an empty stomach for 30 minutes for Osteoporosis. 13 tablet 3   Cholecalciferol (VITAMIN D3) 125 MCG (5000 UT) CAPS Take 5,000 Units by mouth 3 (three) times a week.     folic acid (FOLVITE) 1 MG tablet Take 1-3 mg by mouth daily.     gabapentin (NEURONTIN) 600 MG tablet TAKE 1/2 TO 1 TABLET 2 TO 3  TIMES DAILY AS NEEDED FOR PAIN 270 tablet 3   levothyroxine (SYNTHROID) 88 MCG tablet Take 88 mcg by mouth daily.     omeprazole (PRILOSEC) 40 MG capsule TAKE 1 CAPSULE TWICE DAILY TO PREVENT HEARTBURN AND INDIGESTION (Patient taking differently: Take 40 mg by mouth 2 (two) times daily as needed (acid reflux).) 180 capsule 3   hydroxychloroquine (PLAQUENIL) 200 MG tablet Take 200 mg by mouth 2 (two) times daily. (Patient not taking: Reported on 01/19/2024)     magnesium gluconate (MAGONATE) 500 MG tablet Take 500 mg by mouth See admin instructions. Take 500 mg by mouth at bedtime on Mon/Wed/Fri (Patient not taking: Reported on 01/19/2024)     methotrexate 50 MG/2ML injection Inject 25 mg into the skin every Thursday. (Patient not taking: Reported on 01/19/2024)      REVIEW OF SYSTEMS: Unable to fully obtain Constitutional: + Generalized weakness Eyes: Denies blurriness of vision, double vision or watery eyes Ears, nose, mouth, throat, and face: Denies mucositis or sore throat Respiratory: Denies cough, dyspnea or wheezes Cardiovascular: Denies palpitation, chest discomfort or lower extremity swelling Gastrointestinal: Denies nausea, heartburn or change in bowel habits Skin: + Yellow Lymphatics: Denies new lymphadenopathy or easy bruising Neurological: Denies numbness, tingling or new weaknesses Behavioral/Psych: Mood is stable, no new changes  All other systems were reviewed with the patient and are negative.  PHYSICAL EXAMINATION: ECOG PERFORMANCE STATUS: 4 - Bedbound  Vitals:   01/19/24 1615 01/19/24 1634  BP: (!) 144/82 (!) 144/82  Pulse: (!) 109 (!) 116  Resp: (!) 30 (!) 24  Temp: 98.5 F (36.9 C) 98.5 F (36.9 C)  SpO2: 95%    Filed Weights   01/19/24 1608  Weight: 135 lb (61.2 kg)    GENERAL: + Chronically ill-appearing + frail SKIN: skin color, texture, turgor are normal, no rashes or significant lesions EYES: normal, conjunctiva are pink and non-injected, sclera  clear OROPHARYNX: no exudate, no erythema and lips, buccal mucosa, and tongue normal  NECK: supple, thyroid normal size, non-tender, without nodularity LYMPH: no palpable lymphadenopathy in the cervical, axillary or inguinal LUNGS: clear to auscultation and percussion with normal breathing effort HEART: regular rate & rhythm and no murmurs and no lower extremity edema ABDOMEN: abdomen soft, non-tender and normal bowel sounds MUSCULOSKELETAL: no cyanosis of digits and no clubbing  PSYCH: alert & oriented x 3 with fluent speech NEURO: no focal motor/sensory deficits   LABS: Lab Results  Component Value Date   WBC 0.7 (LL) 01/19/2024   HGB 6.4 (LL) 01/19/2024   HCT 19.6 (L) 01/19/2024   MCV 91.2 01/19/2024   PLT 41 (L) 01/19/2024    Lab Results  Component Value Date   WBC 0.7 (LL) 01/19/2024   HGB 6.4 (LL) 01/19/2024   HCT 19.6 (L) 01/19/2024   PLT 41 (L) 01/19/2024   GLUCOSE 80 01/19/2024   CHOL 180 10/27/2023   TRIG 92 10/27/2023   HDL 73 10/27/2023   LDLCALC 88  10/27/2023   ALT 26 01/19/2024   AST 28 01/19/2024   NA 129 (L) 01/19/2024   K 4.0 01/19/2024   CL 97 (L) 01/19/2024   CREATININE 0.76 01/19/2024   BUN 8 01/19/2024   CO2 25 01/19/2024   INR 1.2 01/19/2024   HGBA1C 5.0 07/25/2023   MICROALBUR <0.2 07/25/2023    VAS [place] LOWER EXTREMITY VENOUS (DVT) Result Date: 01/19/2024  Lower Venous DVT Study Patient Name:  [name]  Date of Exam:   01/19/2024 Medical Rec #: [id number]           Accession #:    [id number] Date of Birth: [birth year]            Patient Gender: F Patient Age:   77 years Exam Location:  [facility] Procedure:      VAS [place] LOWER EXTREMITY VENOUS (DVT) Referring Phys: Tereasa Coop --------------------------------------------------------------------------------  Indications: Swelling, Edema, SOB, pulmonary embolism, and Weakness, right sided chest pain, knee replacement surgery in 2024.  Risk Factors: Confirmed PE. Limitations:  Involuntary movements. Comparison Study: No prior exam. Performing Technologist: Fernande Bras  Examination Guidelines: A complete evaluation includes B-mode imaging, spectral Doppler, color Doppler, and power Doppler as needed of all accessible portions of each vessel. Bilateral testing is considered an integral part of a complete examination. Limited examinations for reoccurring indications may be performed as noted. The reflux portion of the exam is performed with the patient in reverse Trendelenburg.  +---------+---------------+---------+-----------+----------+--------------+ RIGHT    CompressibilityPhasicitySpontaneityPropertiesThrombus Aging +---------+---------------+---------+-----------+----------+--------------+ CFV      Full           No       Yes                                 +---------+---------------+---------+-----------+----------+--------------+ SFJ      Full           Yes      Yes                                 +---------+---------------+---------+-----------+----------+--------------+ FV Prox  Full                                                        +---------+---------------+---------+-----------+----------+--------------+ FV Mid   Full                                                        +---------+---------------+---------+-----------+----------+--------------+ FV DistalFull                                                        +---------+---------------+---------+-----------+----------+--------------+ PFV      Full                                                        +---------+---------------+---------+-----------+----------+--------------+  POP      None           No       No                                  +---------+---------------+---------+-----------+----------+--------------+ PTV      Full                                                         +---------+---------------+---------+-----------+----------+--------------+ PERO     None           No       No                                  +---------+---------------+---------+-----------+----------+--------------+ Gastroc  None           No       No                                  +---------+---------------+---------+-----------+----------+--------------+   +---------+---------------+---------+-----------+----------+-------------------+ LEFT     CompressibilityPhasicitySpontaneityPropertiesThrombus Aging      +---------+---------------+---------+-----------+----------+-------------------+ CFV      Full           Yes      Yes                                      +---------+---------------+---------+-----------+----------+-------------------+ SFJ      Full           Yes      Yes                                      +---------+---------------+---------+-----------+----------+-------------------+ FV Prox  Full                                                             +---------+---------------+---------+-----------+----------+-------------------+ FV Mid   Full                                                             +---------+---------------+---------+-----------+----------+-------------------+ FV DistalFull                                                             +---------+---------------+---------+-----------+----------+-------------------+ PFV      Full                                                             +---------+---------------+---------+-----------+----------+-------------------+  POP      None           No       No                                       +---------+---------------+---------+-----------+----------+-------------------+ PTV                                                   Not well seen,                                                            patent by color.     +---------+---------------+---------+-----------+----------+-------------------+ PERO                                                  Not well seen,                                                            patent by color.    +---------+---------------+---------+-----------+----------+-------------------+ Gastroc  None           No       No                                       +---------+---------------+---------+-----------+----------+-------------------+     Summary: RIGHT: - Findings consistent with acute deep vein thrombosis involving the right popliteal vein, and right peroneal veins. Findings consistent with acute intramuscular thrombosis involving the right gastrocnemius veins. - No cystic structure found in the popliteal fossa.  LEFT: - Findings consistent with acute deep vein thrombosis involving the left popliteal vein. Findings consistent with acute intramuscular thrombosis involving the left gastrocnemius veins. - No cystic structure found in the popliteal fossa.  *See table(s) above for measurements and observations. Electronically signed by Gerarda Fraction on 01/19/2024 at 11:23:25 AM.    Final    CT Angio Chest PE W and/or Wo Contrast Result Date: 01/19/2024 CLINICAL DATA:  Pulmonary embolism (PE) suspected, high prob; Abdominal pain, acute, nonlocalized. Neutropenia, dyspnea, cough, melena, elevated D-dimer EXAM: CT ANGIOGRAPHY CHEST CT ABDOMEN AND PELVIS WITH CONTRAST TECHNIQUE: Multidetector CT imaging of the chest was performed using the standard protocol during bolus administration of intravenous contrast. Multiplanar CT image reconstructions and MIPs were obtained to evaluate the vascular anatomy. Multidetector CT imaging of the abdomen and pelvis was performed using the standard protocol during bolus administration of intravenous contrast. RADIATION DOSE REDUCTION: This exam was performed according to the departmental dose-optimization program which includes automated  exposure control, adjustment of the mA and/or kV according to patient size and/or use of iterative reconstruction technique. CONTRAST:  OMNIPAQUE IOHEXOL 350  MG/ML SOLN COMPARISON:  None Available. FINDINGS: CTA CHEST FINDINGS Cardiovascular: Mood there is adequate opacification of the pulmonary arterial tree. There are multiple intraluminal filling defects identified within multiple segmental pulmonary arteries of the right upper lobe, right lower lobe left lobe keeping with changes of acute pulmonary embolus. The embolic burden is moderate. The central pulmonary arteries are enlarged in keeping with changes of pulmonary arterial hypertension. There is, however, no CT evidence of right heart strain. Moderate left anterior descending coronary artery calcification. Global cardiac size is mildly enlarged. No pericardial effusion. Mild atherosclerotic calcification within the thoracic aorta. No aortic aneurysm. Mediastinum/Nodes: Shotty mediastinal adenopathy may be reactive in nature. No frankly pathologic thoracic adenopathy. 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 Esophagus unremarkable. Lungs/Pleura: There is extensive reticulonodular infiltrate throughout the lungs bilaterally with more focal consolidation within right lower lobe, possibly related to extensive pneumonic consolidation with superimposed infiltrate related to pulmonary infarct. Small right and trace left pleural effusions are present. No pneumothorax. Musculoskeletal: Osseous structures are age-appropriate. No acute bone abnormality. Review of the MIP images confirms the above findings. CT ABDOMEN and PELVIS FINDINGS Hepatobiliary: Multiple cysts are seen scattered throughout the liver. Status post cholecystectomy. No intra or extrahepatic biliary ductal dilation. Pancreas: Unremarkable Spleen: Unremarkable Adrenals/Urinary Tract: Adrenal glands are unremarkable. Kidneys are normal, without renal calculi, focal lesion, or hydronephrosis. Bladder  is unremarkable. 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 Moderate sigmoid diverticulosis. Stomach, small bowel, and large bowel are otherwise unremarkable. Mild ascites. No free intraperitoneal gas. Vascular/Lymphatic: Aortic atherosclerosis. No enlarged abdominal or pelvic lymph nodes. Reproductive: Status post hysterectomy. No adnexal masses. Other: No abdominal wall hernia Musculoskeletal: Status post left hip ORIF. Degenerative changes are seen within the lumbar spine. No acute bone abnormality. No lytic or blastic bone lesion. Review of the MIP images confirms the above findings. IMPRESSION: 1. Acute pulmonary embolus with moderate embolic burden. No CT evidence of right heart strain. 2. Extensive reticulonodular infiltrate throughout the lungs bilaterally with more focal consolidation within the right lower lobe, possibly related to extensive pneumonic consolidation with superimposed infiltrate related to pulmonary infarct. 3. Small right and trace left pleural effusions. 4. Moderate left anterior descending coronary artery calcification. Mild cardiomegaly. 5. Mild ascites. 6. Moderate sigmoid diverticulosis. 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 Aortic Atherosclerosis (ICD10-I70.0). Electronically Signed   By: Helyn Numbers M.D.   On: 01/19/2024 02:32   CT ABDOMEN PELVIS W CONTRAST Result Date: 01/19/2024 CLINICAL DATA:  Pulmonary embolism (PE) suspected, high prob; Abdominal pain, acute, nonlocalized. Neutropenia, dyspnea, cough, melena, elevated D-dimer EXAM: CT ANGIOGRAPHY CHEST CT ABDOMEN AND PELVIS WITH CONTRAST TECHNIQUE: Multidetector CT imaging of the chest was performed using the standard protocol during bolus administration of intravenous contrast. Multiplanar CT image reconstructions and MIPs were obtained to evaluate the vascular anatomy. Multidetector CT imaging of the abdomen and pelvis was performed using the standard protocol during bolus  administration of intravenous contrast. RADIATION DOSE REDUCTION: This exam was performed according to the departmental dose-optimization program which includes automated exposure control, adjustment of the mA and/or kV according to patient size and/or use of iterative reconstruction technique. CONTRAST:  OMNIPAQUE IOHEXOL 350 MG/ML SOLN COMPARISON:  None Available. FINDINGS: CTA CHEST FINDINGS Cardiovascular: Mood there is adequate opacification of the pulmonary arterial tree. There are multiple intraluminal filling defects identified within multiple segmental pulmonary arteries of the right upper lobe, right lower lobe left lobe keeping with changes of acute pulmonary embolus. The embolic burden is moderate. The central pulmonary  arteries are enlarged in keeping with changes of pulmonary arterial hypertension. There is, however, no CT evidence of right heart strain. Moderate left anterior descending coronary artery calcification. Global cardiac size is mildly enlarged. No pericardial effusion. Mild atherosclerotic calcification within the thoracic aorta. No aortic aneurysm. Mediastinum/Nodes: Shotty mediastinal adenopathy may be reactive in nature. No frankly pathologic thoracic adenopathy. 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 Esophagus unremarkable. Lungs/Pleura: There is extensive reticulonodular infiltrate throughout the lungs bilaterally with more focal consolidation within right lower lobe, possibly related to extensive pneumonic consolidation with superimposed infiltrate related to pulmonary infarct. Small right and trace left pleural effusions are present. No pneumothorax. Musculoskeletal: Osseous structures are age-appropriate. No acute bone abnormality. Review of the MIP images confirms the above findings. CT ABDOMEN and PELVIS FINDINGS Hepatobiliary: Multiple cysts are seen scattered throughout the liver. Status post cholecystectomy. No intra or extrahepatic biliary ductal dilation. Pancreas:  Unremarkable Spleen: Unremarkable Adrenals/Urinary Tract: Adrenal glands are unremarkable. Kidneys are normal, without renal calculi, focal lesion, or hydronephrosis. Bladder is unremarkable. 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 Moderate sigmoid diverticulosis. Stomach, small bowel, and large bowel are otherwise unremarkable. Mild ascites. No free intraperitoneal gas. Vascular/Lymphatic: Aortic atherosclerosis. No enlarged abdominal or pelvic lymph nodes. Reproductive: Status post hysterectomy. No adnexal masses. Other: No abdominal wall hernia Musculoskeletal: Status post left hip ORIF. Degenerative changes are seen within the lumbar spine. No acute bone abnormality. No lytic or blastic bone lesion. Review of the MIP images confirms the above findings. IMPRESSION: 1. Acute pulmonary embolus with moderate embolic burden. No CT evidence of right heart strain. 2. Extensive reticulonodular infiltrate throughout the lungs bilaterally with more focal consolidation within the right lower lobe, possibly related to extensive pneumonic consolidation with superimposed infiltrate related to pulmonary infarct. 3. Small right and trace left pleural effusions. 4. Moderate left anterior descending coronary artery calcification. Mild cardiomegaly. 5. Mild ascites. 6. Moderate sigmoid diverticulosis. 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 Aortic Atherosclerosis (ICD10-I70.0). Electronically Signed   By: Helyn Numbers M.D.   On: 01/19/2024 02:32   CT Head Wo Contrast Result Date: 01/19/2024 CLINICAL DATA:  Altered mental status EXAM: CT HEAD WITHOUT CONTRAST TECHNIQUE: Contiguous axial images were obtained from the base of the skull through the vertex without intravenous contrast. RADIATION DOSE REDUCTION: This exam was performed according to the departmental dose-optimization program which includes automated exposure control, adjustment of the mA and/or kV according to patient  size and/or use of iterative reconstruction technique. COMPARISON:  09/20/2005 FINDINGS: Brain: There is no mass, hemorrhage or extra-axial collection. There is generalized atrophy without lobar predilection. Hypodensity of the white matter is most commonly associated with chronic microvascular disease. Vascular: Atherosclerotic calcification of the internal carotid arteries at the skull base. No abnormal hyperdensity of the major intracranial arteries or dural venous sinuses. Skull: The visualized skull base, calvarium and extracranial soft tissues are normal. Sinuses/Orbits: No fluid levels or advanced mucosal thickening of the visualized paranasal sinuses. No mastoid or middle ear effusion. Normal orbits. Other: None. IMPRESSION: 1. No acute intracranial abnormality. 2. Generalized atrophy and findings of chronic microvascular disease. Electronically Signed   By: Deatra Robinson M.D.   On: 01/19/2024 02:01   DG Chest Port 1 View Result Date: 01/18/2024 CLINICAL DATA:  Shortness of breath. EXAM: PORTABLE CHEST 1 VIEW COMPARISON:  07/06/2023 FINDINGS: The heart is enlarged. Diffuse interstitial opacities throughout both lungs with more patchy opacity at the lung bases. No pneumothorax. There may be a right pleural effusion. IMPRESSION: Cardiomegaly  with diffuse interstitial opacities and more patchy opacity at the lung bases, may represent pulmonary edema, multifocal pneumonia, or combination there. Possible right pleural effusion. Electronically Signed   By: Narda Rutherford M.D.   On: 01/18/2024 23:08     The total time spent in the appointment was 55 minutes encounter with patients including review of chart and various tests results, discussions about plan of care and coordination of care plan   All questions were answered. The patient knows to call the clinic with any problems, questions or concerns. No barriers to learning was detected.  Thank you for the courtesy of this consultation, Jaci Standard, MD  3/13/20255:12 PM  I have read the above note and personally examined the patient. I have made adjustments to the above note. I agree with the assessment and plan as noted above.  Briefly Mrs. Sharece Fleischhacker is a 78 year old female who presents with weakness and was found to have marked pancytopenia as well as bilateral DVT/pulmonary embolism.  At this time her pancytopenia is likely secondary to marrow suppression from her methotrexate.  Recommend holding this medication providing her with folic acid 1 mg p.o. 3 times daily.  Additionally the patient does require anticoagulation therapy, however with her thrombocytopenia would recommend caution.  Recommend transfusing her hemoglobin up to greater than 8 and her platelets greater than 50.  When starting her heparin therapy recommend targeting the lower level of the therapeutic range.  The patient and her husband voiced understanding of our findings and recommendation moving forward.  Hematology service will continue to follow.   Ulysees Barns, MD Department of Hematology/Oncology [facility] Phone: [phone] Pager: [phone] Email: [email or web address]

## 2024-01-19 NOTE — Consult Note (Signed)
 78 y/o female with h/o Hypothyroidism, anemia, PUD, RA on methotrexate and Plaquenil, HTN, hip and knee surgery who has been deteriorating and weakness of several months.  She presented with cough, chest pain and EMS found ehr to be hypoxic.  She had a CTA in ED which revealed multiple segmental PE.  She was found to have Pancytopenia and Platelets were 15k.  Husband reported dark brown stools. I was asked by Dr Janalyn Shy to give advice.  A/p Pulmonary Embolism Pancytopenia/thrombocytopenia Possible GI bleed Pneumonia  Suggest -Give Platelet transfusion to raise platelets to at least 50,000 -the start without IV Heparin no bolus and keep PTT 45-50 -If she does start bleeding via GI, stop Heparin (would get GI evaluation) -Hematology evaluation as this maybe Methotrexate related pancytopenia or bone marrow suppression from MDS/Lymphoproliferative disease. -Pneumonia maybe ILD underlying from Methotrexate vs ILD from RA or other causes, suggest Pulmonary consult -Patient currently does not meet ICU criteria and does not meet Pulmonary embolism extraction  The patient is critically ill with multiple organ system failure and requires high complexity decision making for assessment and support, frequent evaluation and titration of therapies, advanced monitoring, review of radiographic studies and interpretation of complex data.     Rozann Lesches, MD [facility] See Amion for pager  If no response to pager , please call [phone] until 7pm After 7:00 pm call Elink  [phone] 01/19/2024, 3:35 AM

## 2024-01-19 NOTE — ED Notes (Signed)
 Pt noted to have pulled her IV out of her hand. There was blood noted on pt, her clothes, her bed linens, as well as monitor equipment. Staff cleaned pt up, cleaned the equipment, changed her bed linens and repositioned pt in the bed. She had no complaints at that time and she was resting comfortably.

## 2024-01-20 ENCOUNTER — Inpatient Hospital Stay (HOSPITAL_COMMUNITY)

## 2024-01-20 DIAGNOSIS — J9601 Acute respiratory failure with hypoxia: Secondary | ICD-10-CM

## 2024-01-20 DIAGNOSIS — J189 Pneumonia, unspecified organism: Secondary | ICD-10-CM | POA: Diagnosis not present

## 2024-01-20 DIAGNOSIS — A419 Sepsis, unspecified organism: Secondary | ICD-10-CM | POA: Diagnosis not present

## 2024-01-20 DIAGNOSIS — I2694 Multiple subsegmental pulmonary emboli without acute cor pulmonale: Secondary | ICD-10-CM

## 2024-01-20 LAB — CBC
HCT: 29.5 % — ABNORMAL LOW (ref 36.0–46.0)
HCT: 28.7 % — ABNORMAL LOW (ref 36.0–46.0)
Hemoglobin: 10 g/dL — ABNORMAL LOW (ref 12.0–15.0)
Hemoglobin: 9.5 g/dL — ABNORMAL LOW (ref 12.0–15.0)
MCH: 30.8 pg (ref 26.0–34.0)
MCH: 30.3 pg (ref 26.0–34.0)
MCHC: 33.9 g/dL (ref 30.0–36.0)
MCHC: 33.1 g/dL (ref 30.0–36.0)
MCV: 90.8 fL (ref 80.0–100.0)
MCV: 91.4 fL (ref 80.0–100.0)
Platelets: 26 10*3/uL — CL (ref 150–400)
Platelets: 25 10*3/uL — CL (ref 150–400)
RBC: 3.25 MIL/uL — ABNORMAL LOW (ref 3.87–5.11)
RBC: 3.14 MIL/uL — ABNORMAL LOW (ref 3.87–5.11)
RDW: 17 % — ABNORMAL HIGH (ref 11.5–15.5)
RDW: 17.2 % — ABNORMAL HIGH (ref 11.5–15.5)
WBC: 1 10*3/uL — CL (ref 4.0–10.5)
WBC: 0.9 10*3/uL — CL (ref 4.0–10.5)
nRBC: 13.1 % — ABNORMAL HIGH (ref 0.0–0.2)
nRBC: 17 % — ABNORMAL HIGH (ref 0.0–0.2)

## 2024-01-20 LAB — COMPREHENSIVE METABOLIC PANEL
ALT: 29 U/L (ref 0–44)
AST: 41 U/L (ref 15–41)
Albumin: 2.2 g/dL — ABNORMAL LOW (ref 3.5–5.0)
Alkaline Phosphatase: 115 U/L (ref 38–126)
Anion gap: 11 (ref 5–15)
BUN: 9 mg/dL (ref 8–23)
CO2: 21 mmol/L — ABNORMAL LOW (ref 22–32)
Calcium: 8.2 mg/dL — ABNORMAL LOW (ref 8.9–10.3)
Chloride: 95 mmol/L — ABNORMAL LOW (ref 98–111)
Creatinine, Ser: 0.69 mg/dL (ref 0.44–1.00)
GFR, Estimated: >60 mL/min (ref >60)
Glucose, Bld: 72 mg/dL (ref 70–99)
Potassium: 3.9 mmol/L (ref 3.5–5.1)
Sodium: 127 mmol/L — ABNORMAL LOW (ref 135–145)
Total Bilirubin: 2.6 mg/dL — ABNORMAL HIGH (ref 0.0–1.2)
Total Protein: 5.1 g/dL — ABNORMAL LOW (ref 6.5–8.1)

## 2024-01-20 LAB — BLOOD GAS, VENOUS
Acid-base deficit: 0.5 mmol/L (ref 0.0–2.0)
Bicarbonate: 23.1 mmol/L (ref 20.0–28.0)
Drawn by: 7020
O2 Saturation: 78.1 %
Patient temperature: 37.1
pCO2, Ven: 34 mmHg — ABNORMAL LOW (ref 44–60)
pH, Ven: 7.44 — ABNORMAL HIGH (ref 7.25–7.43)
pO2, Ven: 44 mmHg (ref 32–45)

## 2024-01-20 LAB — BPAM PLATELET PHERESIS
Blood Product Expiration Date: 202503152359
Blood Product Expiration Date: 202503152359
ISSUE DATE / TIME: 202503130433
Unit Type and Rh: 7300
Unit Type and Rh: 7300

## 2024-01-20 LAB — PREPARE PLATELET PHERESIS
Unit division: 0
Unit division: 0

## 2024-01-20 LAB — HEPARIN LEVEL (UNFRACTIONATED): Heparin Unfractionated: 0.37 [IU]/mL (ref 0.30–0.70)

## 2024-01-20 LAB — TROPONIN I (HIGH SENSITIVITY)
Troponin I (High Sensitivity): 764 ng/L (ref <18)
Troponin I (High Sensitivity): 662 ng/L (ref <18)

## 2024-01-20 LAB — BRAIN NATRIURETIC PEPTIDE: B Natriuretic Peptide: 1741.6 pg/mL — ABNORMAL HIGH (ref 0.0–100.0)

## 2024-01-20 LAB — PROCALCITONIN: Procalcitonin: 0.91 ng/mL

## 2024-01-20 LAB — MAGNESIUM: Magnesium: 1.6 mg/dL — ABNORMAL LOW (ref 1.7–2.4)

## 2024-01-20 LAB — FOLATE: Folate: 2.3 ng/mL — ABNORMAL LOW (ref >5.9)

## 2024-01-20 LAB — IRON AND TIBC
Iron: 34 ug/dL (ref 28–170)
Saturation Ratios: 28 % (ref 10.4–31.8)
TIBC: 120 ug/dL — ABNORMAL LOW (ref 250–450)
UIBC: 86 ug/dL

## 2024-01-20 LAB — PHOSPHORUS: Phosphorus: 1.4 mg/dL — ABNORMAL LOW (ref 2.5–4.6)

## 2024-01-20 LAB — TSH: TSH: 12.1 u[IU]/mL — ABNORMAL HIGH (ref 0.350–4.500)

## 2024-01-20 LAB — FERRITIN: Ferritin: 1326 ng/mL — ABNORMAL HIGH (ref 11–307)

## 2024-01-20 LAB — T4, FREE: Free T4: 0.77 ng/dL (ref 0.61–1.12)

## 2024-01-20 LAB — VITAMIN B12: Vitamin B-12: 952 pg/mL — ABNORMAL HIGH (ref 180–914)

## 2024-01-20 MED ORDER — CHLORHEXIDINE GLUCONATE CLOTH 2 % EX PADS: 6.0000 | MEDICATED_PAD | Freq: Every evening | CUTANEOUS | Status: DC

## 2024-01-20 MED ORDER — MAGNESIUM SULFATE 2 GM/50ML IV SOLN
2.0000 g | Freq: Once | INTRAVENOUS | Status: AC
  Administered 2024-01-20: 2 g via INTRAVENOUS
  Filled 2024-01-20: qty 50

## 2024-01-20 MED ORDER — HALOPERIDOL LACTATE 2 MG/ML PO CONC
0.5000 mg | ORAL | Status: DC | PRN
  Filled 2024-01-20: qty 5

## 2024-01-20 MED ORDER — FUROSEMIDE 10 MG/ML IJ SOLN: 40.0000 mg | Freq: Two times a day (BID) | INTRAMUSCULAR | Status: DC

## 2024-01-20 MED ORDER — LORAZEPAM 1 MG PO TABS: 1.0000 mg | ORAL_TABLET | ORAL | Status: DC | PRN

## 2024-01-20 MED ORDER — HALOPERIDOL 1 MG PO TABS: 0.5000 mg | ORAL_TABLET | ORAL | Status: DC | PRN

## 2024-01-20 MED ORDER — MORPHINE SULFATE (PF) 2 MG/ML IV SOLN
1.0000 mg | INTRAVENOUS | Status: DC | PRN
  Administered 2024-01-20: 2 mg via INTRAVENOUS
  Administered 2024-01-20: 4 mg via INTRAVENOUS
  Filled 2024-01-20: qty 2
  Filled 2024-01-20: qty 1

## 2024-01-20 MED ORDER — BIOTENE DRY MOUTH MT LIQD: 15.0000 mL | OROMUCOSAL | Status: DC | PRN

## 2024-01-20 MED ORDER — SODIUM PHOSPHATES 45 MMOLE/15ML IV SOLN
30.0000 mmol | Freq: Once | INTRAVENOUS | Status: DC
  Administered 2024-01-20: 30 mmol via INTRAVENOUS
  Filled 2024-01-20: qty 10

## 2024-01-20 MED ORDER — ORAL CARE MOUTH RINSE: 15.0000 mL | OROMUCOSAL | Status: DC | PRN

## 2024-01-20 MED ORDER — GLYCOPYRROLATE 0.2 MG/ML IJ SOLN: 0.2000 mg | INTRAMUSCULAR | Status: DC | PRN

## 2024-01-20 MED ORDER — POLYVINYL ALCOHOL 1.4 % OP SOLN: 1.0000 [drp] | Freq: Four times a day (QID) | OPHTHALMIC | Status: DC | PRN

## 2024-01-20 MED ORDER — LORAZEPAM 2 MG/ML PO CONC
1.0000 mg | ORAL | Status: DC | PRN
  Filled 2024-01-20: qty 0.5

## 2024-01-20 MED ORDER — LORAZEPAM 2 MG/ML IJ SOLN
1.0000 mg | INTRAMUSCULAR | Status: DC | PRN
  Administered 2024-01-20: 1 mg via INTRAVENOUS
  Filled 2024-01-20: qty 1

## 2024-01-20 MED ORDER — HALOPERIDOL LACTATE 5 MG/ML IJ SOLN: 0.5000 mg | INTRAMUSCULAR | Status: DC | PRN

## 2024-01-20 MED ORDER — SODIUM CHLORIDE 0.9% IV SOLUTION
Freq: Once | INTRAVENOUS | Status: DC
Start: 2024-01-20 — End: 2024-01-20

## 2024-01-20 MED ORDER — METHYLPREDNISOLONE SODIUM SUCC 40 MG IJ SOLR
40.0000 mg | Freq: Two times a day (BID) | INTRAMUSCULAR | Status: DC
  Administered 2024-01-20: 40 mg via INTRAVENOUS
  Filled 2024-01-20: qty 1

## 2024-01-20 MED ORDER — VANCOMYCIN HCL IN DEXTROSE 1-5 GM/200ML-% IV SOLN: 1000.0000 mg | INTRAVENOUS | Status: DC

## 2024-01-20 MED ORDER — GLYCOPYRROLATE 1 MG PO TABS: 1.0000 mg | ORAL_TABLET | ORAL | Status: DC | PRN

## 2024-01-21 LAB — CULTURE, BLOOD (ROUTINE X 2)

## 2024-01-23 LAB — TYPE AND SCREEN
ABO/RH(D): AB POS
Antibody Screen: NEGATIVE
Unit division: 0
Unit division: 0

## 2024-01-23 LAB — BPAM RBC
Blood Product Expiration Date: 202504122359
Blood Product Expiration Date: 202504122359
ISSUE DATE / TIME: 202503130631
ISSUE DATE / TIME: 202503131202
Unit Type and Rh: 6200
Unit Type and Rh: 6200

## 2024-01-23 LAB — MULTIPLE MYELOMA PANEL, SERUM
Albumin SerPl Elph-Mcnc: 2.3 g/dL — ABNORMAL LOW (ref 2.9–4.4)
Albumin/Glob SerPl: 1.1 (ref 0.7–1.7)
Alpha 1: 0.6 g/dL — ABNORMAL HIGH (ref 0.0–0.4)
Alpha2 Glob SerPl Elph-Mcnc: 0.5 g/dL (ref 0.4–1.0)
B-Globulin SerPl Elph-Mcnc: 0.6 g/dL — ABNORMAL LOW (ref 0.7–1.3)
Gamma Glob SerPl Elph-Mcnc: 0.5 g/dL (ref 0.4–1.8)
Globulin, Total: 2.1 g/dL — ABNORMAL LOW (ref 2.2–3.9)
IgA: 101 mg/dL (ref 64–422)
IgG (Immunoglobin G), Serum: 482 mg/dL — ABNORMAL LOW (ref 586–1602)
IgM (Immunoglobulin M), Srm: 48 mg/dL (ref 26–217)
M Protein SerPl Elph-Mcnc: 0.2 g/dL — ABNORMAL HIGH
Total Protein ELP: 4.4 g/dL — ABNORMAL LOW (ref 6.0–8.5)

## 2024-01-23 LAB — CULTURE, BLOOD (ROUTINE X 2)

## 2024-01-23 LAB — KAPPA/LAMBDA LIGHT CHAINS
Kappa free light chain: 15.4 mg/L (ref 3.3–19.4)
Kappa, lambda light chain ratio: 0.65 (ref 0.26–1.65)
Lambda free light chains: 23.7 mg/L (ref 5.7–26.3)

## 2024-01-23 LAB — METHYLMALONIC ACID, SERUM: Methylmalonic Acid, Quantitative: 206 nmol/L (ref 0–378)

## 2024-01-30 ENCOUNTER — Ambulatory Visit: Payer: Medicare PPO | Admitting: Internal Medicine

## 2024-02-07 NOTE — Progress Notes (Signed)
 Chaplain was paged to provide grief support for [name]'s husband, daughter and her husband.  After a short time, [name]'s family stated that they are okay and would let staff know if they needed additional support.

## 2024-02-07 NOTE — Progress Notes (Signed)
 [name]   DOB:[birth year]   OV#:[id number]      ASSESSMENT & PLAN:  Pancytopenia: Anemia - Hemoglobin 6.4 yesterday, status post transfusions with increase to 10.0 today.  Patient has been made DNR comfort care today.  No further interventions.   Thrombocytopenia - Platelets low 41K--> 26K today.  Family has chosen no more intervention.   Leukopenia - WBC 1.0 - likely due to weekly methotrexate for RA.  Last dose given 01/11/2024. - Monitor closely -- no indication for GSCF, would not be as successful in setting of marrow suppression from methotrexate  -Hematology/Dr. Leonides Schanz will be following closely   Acute Pulmonary Embolism/ Bilateral LE DVTs  - CT angio chest done 01/19/2024 shows acute PE with moderate embolic burden.  No CT evidence of right heart strain. - Anticoagulation with IV heparin recommended -- can start anticoagulation once Plt are >50. Recommend targeting lower end of PTT/heparin therapeutic level  (PTT approx. 60, Heparin level 0.3) - No further intervention   RA - Patient was on weekly methotrexate.  Last dose given last week Wednesday, 01/11/2024 - Sees rheumatology/Dr. Lendon Colonel.  Has been on Plaquenil as well which was held since November 2024.  Sepsis  -May be secondary to PNA - Status post IV antibiotics  Generalized Weakness - Patient is deconditioned. - Palliative management appreciated     Code Status DNR-comfort care  Subjective:  Patient seen in ICU, very lethargic and dyspneic.  Family at bedside.  Per nurse family has decided to make patient comfort care only with no further medical interventions.  Objective:  Vitals:   01/09/2024 1100 01/26/2024 1115  BP: (!) 164/79   Pulse: (!) 139 (!) 120  Resp: (!) 30 (!) 33  Temp:    SpO2: 94% (!) 53%     Intake/Output Summary (Last 24 hours) at 01/14/2024 1147 Last data filed at 01/11/2024 0800 Gross per 24 hour  Intake 969.98 ml  Output --  Net 969.98 ml     REVIEW OF SYSTEMS: Unable to  obtain  PHYSICAL EXAMINATION: ECOG PERFORMANCE STATUS: 4 - Bedbound  Vitals:   01/12/2024 1100 01/29/2024 1115  BP: (!) 164/79   Pulse: (!) 139 (!) 120  Resp: (!) 30 (!) 33  Temp:    SpO2: 94% (!) 53%   Filed Weights   01/19/24 1608 01/19/24 2041  Weight: 135 lb (61.2 kg) 129 lb 6.6 oz (58.7 kg)    GENERAL: + Lethargic + dyspneic  SKIN: + Jaundiced skin color, texture, turgor are normal, no rashes or significant lesions EYES: + Scleral icterus OROPHARYNX: no exudate, no erythema and lips, buccal mucosa, and tongue normal  NECK: supple, thyroid normal size, non-tender, without nodularity LYMPH: no palpable lymphadenopathy in the cervical, axillary or inguinal LUNGS: + Dyspnea HEART: regular rate & rhythm and no murmurs and no lower extremity edema ABDOMEN: abdomen soft, non-tender and normal bowel sounds MUSCULOSKELETAL: no cyanosis of digits and no clubbing  PSYCH: + Lethargic  NEURO: + Lethargic   All questions were answered. The patient knows to call the clinic with any problems, questions or concerns.   The total time spent in the appointment was 40 minutes encounter with patient including review of chart and various tests results, discussions about plan of care and coordination of care plan  Dawson Bills, NP 01/11/2024 11:47 AM    Labs Reviewed:  Lab Results  Component Value Date   WBC 0.9 (LL) 01/10/2024   HGB 9.5 (L) 01/14/2024   HCT 28.7 (L)  02/06/2024   MCV 91.4 02/03/2024   PLT 25 (LL) 01/13/2024   Recent Labs    01/18/24 2228 01/19/24 0651 01/16/2024 0056  NA 132* 129* 127*  K 3.0* 4.0 3.9  CL 96* 97* 95*  CO2 24 25 21*  GLUCOSE 119* 80 72  BUN 8 8 9   CREATININE 0.79 0.76 0.69  CALCIUM 8.8* 8.3* 8.2*  GFRNONAA >60 >60 >60  PROT 5.1* 4.4* 5.1*  ALBUMIN 2.3* 1.9* 2.2*  AST 43* 28 41  ALT 32 26 29  ALKPHOS 119 88 115  BILITOT 1.8* 1.9* 2.6*    Studies Reviewed:  DG CHEST PORT 1 VIEW Result Date: 02/02/2024 CLINICAL DATA:  Shortness of breath.  EXAM: PORTABLE CHEST 1 VIEW COMPARISON:  January 18, 2024. FINDINGS: Stable cardiomegaly. Significant increased bilateral reticular and airspace opacities are noted most consistent with worsening pneumonia or possibly edema. Small pleural effusions are noted. Status post right shoulder arthroplasty. IMPRESSION: Significantly increased bilateral lung opacities as noted above. Electronically Signed   By: Lupita Raider M.D.   On: 02/05/2024 10:55   VAS [place] LOWER EXTREMITY VENOUS (DVT) Result Date: 01/19/2024  Lower Venous DVT Study Patient Name:  [name]  Date of Exam:   01/19/2024 Medical Rec #: [id number]           Accession #:    [id number] Date of Birth: 10/07/1946            Patient Gender: F Patient Age:   78 years Exam Location:  [facility] Procedure:      VAS [place] LOWER EXTREMITY VENOUS (DVT) Referring Phys: Tereasa Coop --------------------------------------------------------------------------------  Indications: Swelling, Edema, SOB, pulmonary embolism, and Weakness, right sided chest pain, knee replacement surgery in 2024.  Risk Factors: Confirmed PE. Limitations: Involuntary movements. Comparison Study: No prior exam. Performing Technologist: Fernande Bras  Examination Guidelines: A complete evaluation includes B-mode imaging, spectral Doppler, color Doppler, and power Doppler as needed of all accessible portions of each vessel. Bilateral testing is considered an integral part of a complete examination. Limited examinations for reoccurring indications may be performed as noted. The reflux portion of the exam is performed with the patient in reverse Trendelenburg.  +---------+---------------+---------+-----------+----------+--------------+ RIGHT    CompressibilityPhasicitySpontaneityPropertiesThrombus Aging +---------+---------------+---------+-----------+----------+--------------+ CFV      Full           No       Yes                                  +---------+---------------+---------+-----------+----------+--------------+ SFJ      Full           Yes      Yes                                 +---------+---------------+---------+-----------+----------+--------------+ FV Prox  Full                                                        +---------+---------------+---------+-----------+----------+--------------+ FV Mid   Full                                                        +---------+---------------+---------+-----------+----------+--------------+  FV DistalFull                                                        +---------+---------------+---------+-----------+----------+--------------+ PFV      Full                                                        +---------+---------------+---------+-----------+----------+--------------+ POP      None           No       No                                  +---------+---------------+---------+-----------+----------+--------------+ PTV      Full                                                        +---------+---------------+---------+-----------+----------+--------------+ PERO     None           No       No                                  +---------+---------------+---------+-----------+----------+--------------+ Gastroc  None           No       No                                  +---------+---------------+---------+-----------+----------+--------------+   +---------+---------------+---------+-----------+----------+-------------------+ LEFT     CompressibilityPhasicitySpontaneityPropertiesThrombus Aging      +---------+---------------+---------+-----------+----------+-------------------+ CFV      Full           Yes      Yes                                      +---------+---------------+---------+-----------+----------+-------------------+ SFJ      Full           Yes      Yes                                       +---------+---------------+---------+-----------+----------+-------------------+ FV Prox  Full                                                             +---------+---------------+---------+-----------+----------+-------------------+ FV Mid   Full                                                             +---------+---------------+---------+-----------+----------+-------------------+  FV DistalFull                                                             +---------+---------------+---------+-----------+----------+-------------------+ PFV      Full                                                             +---------+---------------+---------+-----------+----------+-------------------+ POP      None           No       No                                       +---------+---------------+---------+-----------+----------+-------------------+ PTV                                                   Not well seen,                                                            patent by color.    +---------+---------------+---------+-----------+----------+-------------------+ PERO                                                  Not well seen,                                                            patent by color.    +---------+---------------+---------+-----------+----------+-------------------+ Gastroc  None           No       No                                       +---------+---------------+---------+-----------+----------+-------------------+     Summary: RIGHT: - Findings consistent with acute deep vein thrombosis involving the right popliteal vein, and right peroneal veins. Findings consistent with acute intramuscular thrombosis involving the right gastrocnemius veins. - No cystic structure found in the popliteal fossa.  LEFT: - Findings consistent with acute deep vein thrombosis involving the left popliteal vein. Findings consistent with  acute intramuscular thrombosis involving the left gastrocnemius veins. - No cystic structure found in the popliteal fossa.  *See table(s) above for measurements and observations. Electronically signed by Gerarda Fraction on 01/19/2024 at 11:23:25 AM.    Final    CT Angio  Chest PE W and/or Wo Contrast Result Date: 01/19/2024 CLINICAL DATA:  Pulmonary embolism (PE) suspected, high prob; Abdominal pain, acute, nonlocalized. Neutropenia, dyspnea, cough, melena, elevated D-dimer EXAM: CT ANGIOGRAPHY CHEST CT ABDOMEN AND PELVIS WITH CONTRAST TECHNIQUE: Multidetector CT imaging of the chest was performed using the standard protocol during bolus administration of intravenous contrast. Multiplanar CT image reconstructions and MIPs were obtained to evaluate the vascular anatomy. Multidetector CT imaging of the abdomen and pelvis was performed using the standard protocol during bolus administration of intravenous contrast. RADIATION DOSE REDUCTION: This exam was performed according to the departmental dose-optimization program which includes automated exposure control, adjustment of the mA and/or kV according to patient size and/or use of iterative reconstruction technique. CONTRAST:  OMNIPAQUE IOHEXOL 350 MG/ML SOLN COMPARISON:  None Available. FINDINGS: CTA CHEST FINDINGS Cardiovascular: Mood there is adequate opacification of the pulmonary arterial tree. There are multiple intraluminal filling defects identified within multiple segmental pulmonary arteries of the right upper lobe, right lower lobe left lobe keeping with changes of acute pulmonary embolus. The embolic burden is moderate. The central pulmonary arteries are enlarged in keeping with changes of pulmonary arterial hypertension. There is, however, no CT evidence of right heart strain. Moderate left anterior descending coronary artery calcification. Global cardiac size is mildly enlarged. No pericardial effusion. Mild atherosclerotic calcification within the  thoracic aorta. No aortic aneurysm. Mediastinum/Nodes: Shotty mediastinal adenopathy may be reactive in nature. No frankly pathologic thoracic adenopathy. 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 Esophagus unremarkable. Lungs/Pleura: There is extensive reticulonodular infiltrate throughout the lungs bilaterally with more focal consolidation within right lower lobe, possibly related to extensive pneumonic consolidation with superimposed infiltrate related to pulmonary infarct. Small right and trace left pleural effusions are present. No pneumothorax. Musculoskeletal: Osseous structures are age-appropriate. No acute bone abnormality. Review of the MIP images confirms the above findings. CT ABDOMEN and PELVIS FINDINGS Hepatobiliary: Multiple cysts are seen scattered throughout the liver. Status post cholecystectomy. No intra or extrahepatic biliary ductal dilation. Pancreas: Unremarkable Spleen: Unremarkable Adrenals/Urinary Tract: Adrenal glands are unremarkable. Kidneys are normal, without renal calculi, focal lesion, or hydronephrosis. Bladder is unremarkable. 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 Moderate sigmoid diverticulosis. Stomach, small bowel, and large bowel are otherwise unremarkable. Mild ascites. No free intraperitoneal gas. Vascular/Lymphatic: Aortic atherosclerosis. No enlarged abdominal or pelvic lymph nodes. Reproductive: Status post hysterectomy. No adnexal masses. Other: No abdominal wall hernia Musculoskeletal: Status post left hip ORIF. Degenerative changes are seen within the lumbar spine. No acute bone abnormality. No lytic or blastic bone lesion. Review of the MIP images confirms the above findings. IMPRESSION: 1. Acute pulmonary embolus with moderate embolic burden. No CT evidence of right heart strain. 2. Extensive reticulonodular infiltrate throughout the lungs bilaterally with more focal consolidation within the right lower lobe, possibly related to extensive  pneumonic consolidation with superimposed infiltrate related to pulmonary infarct. 3. Small right and trace left pleural effusions. 4. Moderate left anterior descending coronary artery calcification. Mild cardiomegaly. 5. Mild ascites. 6. Moderate sigmoid diverticulosis. 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 Aortic Atherosclerosis (ICD10-I70.0). Electronically Signed   By: Helyn Numbers M.D.   On: 01/19/2024 02:32   CT ABDOMEN PELVIS W CONTRAST Result Date: 01/19/2024 CLINICAL DATA:  Pulmonary embolism (PE) suspected, high prob; Abdominal pain, acute, nonlocalized. Neutropenia, dyspnea, cough, melena, elevated D-dimer EXAM: CT ANGIOGRAPHY CHEST CT ABDOMEN AND PELVIS WITH CONTRAST TECHNIQUE: Multidetector CT imaging of the chest was performed using the standard protocol during bolus administration of intravenous contrast. Multiplanar  CT image reconstructions and MIPs were obtained to evaluate the vascular anatomy. Multidetector CT imaging of the abdomen and pelvis was performed using the standard protocol during bolus administration of intravenous contrast. RADIATION DOSE REDUCTION: This exam was performed according to the departmental dose-optimization program which includes automated exposure control, adjustment of the mA and/or kV according to patient size and/or use of iterative reconstruction technique. CONTRAST:  OMNIPAQUE IOHEXOL 350 MG/ML SOLN COMPARISON:  None Available. FINDINGS: CTA CHEST FINDINGS Cardiovascular: Mood there is adequate opacification of the pulmonary arterial tree. There are multiple intraluminal filling defects identified within multiple segmental pulmonary arteries of the right upper lobe, right lower lobe left lobe keeping with changes of acute pulmonary embolus. The embolic burden is moderate. The central pulmonary arteries are enlarged in keeping with changes of pulmonary arterial hypertension. There is, however, no CT evidence of right heart strain.  Moderate left anterior descending coronary artery calcification. Global cardiac size is mildly enlarged. No pericardial effusion. Mild atherosclerotic calcification within the thoracic aorta. No aortic aneurysm. Mediastinum/Nodes: Shotty mediastinal adenopathy may be reactive in nature. No frankly pathologic thoracic adenopathy. 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 Esophagus unremarkable. Lungs/Pleura: There is extensive reticulonodular infiltrate throughout the lungs bilaterally with more focal consolidation within right lower lobe, possibly related to extensive pneumonic consolidation with superimposed infiltrate related to pulmonary infarct. Small right and trace left pleural effusions are present. No pneumothorax. Musculoskeletal: Osseous structures are age-appropriate. No acute bone abnormality. Review of the MIP images confirms the above findings. CT ABDOMEN and PELVIS FINDINGS Hepatobiliary: Multiple cysts are seen scattered throughout the liver. Status post cholecystectomy. No intra or extrahepatic biliary ductal dilation. Pancreas: Unremarkable Spleen: Unremarkable Adrenals/Urinary Tract: Adrenal glands are unremarkable. Kidneys are normal, without renal calculi, focal lesion, or hydronephrosis. Bladder is unremarkable. 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 Moderate sigmoid diverticulosis. Stomach, small bowel, and large bowel are otherwise unremarkable. Mild ascites. No free intraperitoneal gas. Vascular/Lymphatic: Aortic atherosclerosis. No enlarged abdominal or pelvic lymph nodes. Reproductive: Status post hysterectomy. No adnexal masses. Other: No abdominal wall hernia Musculoskeletal: Status post left hip ORIF. Degenerative changes are seen within the lumbar spine. No acute bone abnormality. No lytic or blastic bone lesion. Review of the MIP images confirms the above findings. IMPRESSION: 1. Acute pulmonary embolus with moderate embolic burden. No CT evidence of right  heart strain. 2. Extensive reticulonodular infiltrate throughout the lungs bilaterally with more focal consolidation within the right lower lobe, possibly related to extensive pneumonic consolidation with superimposed infiltrate related to pulmonary infarct. 3. Small right and trace left pleural effusions. 4. Moderate left anterior descending coronary artery calcification. Mild cardiomegaly. 5. Mild ascites. 6. Moderate sigmoid diverticulosis. 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 Aortic Atherosclerosis (ICD10-I70.0). Electronically Signed   By: Helyn Numbers M.D.   On: 01/19/2024 02:32   CT Head Wo Contrast Result Date: 01/19/2024 CLINICAL DATA:  Altered mental status EXAM: CT HEAD WITHOUT CONTRAST TECHNIQUE: Contiguous axial images were obtained from the base of the skull through the vertex without intravenous contrast. RADIATION DOSE REDUCTION: This exam was performed according to the departmental dose-optimization program which includes automated exposure control, adjustment of the mA and/or kV according to patient size and/or use of iterative reconstruction technique. COMPARISON:  09/20/2005 FINDINGS: Brain: There is no mass, hemorrhage or extra-axial collection. There is generalized atrophy without lobar predilection. Hypodensity of the white matter is most commonly associated with chronic microvascular disease. Vascular: Atherosclerotic calcification of the internal carotid arteries at the skull base.  No abnormal hyperdensity of the major intracranial arteries or dural venous sinuses. Skull: The visualized skull base, calvarium and extracranial soft tissues are normal. Sinuses/Orbits: No fluid levels or advanced mucosal thickening of the visualized paranasal sinuses. No mastoid or middle ear effusion. Normal orbits. Other: None. IMPRESSION: 1. No acute intracranial abnormality. 2. Generalized atrophy and findings of chronic microvascular disease. Electronically Signed   By: Deatra Robinson M.D.   On: 01/19/2024 02:01   DG Chest Port 1 View Result Date: 01/18/2024 CLINICAL DATA:  Shortness of breath. EXAM: PORTABLE CHEST 1 VIEW COMPARISON:  07/06/2023 FINDINGS: The heart is enlarged. Diffuse interstitial opacities throughout both lungs with more patchy opacity at the lung bases. No pneumothorax. There may be a right pleural effusion. IMPRESSION: Cardiomegaly with diffuse interstitial opacities and more patchy opacity at the lung bases, may represent pulmonary edema, multifocal pneumonia, or combination there. Possible right pleural effusion. Electronically Signed   By: Narda Rutherford M.D.   On: 01/18/2024 23:08

## 2024-02-07 NOTE — Progress Notes (Signed)
 PHARMACY - ANTICOAGULATION CONSULT NOTE  Pharmacy Consult for Heparin  Indication: pulmonary embolus  Allergies  Allergen Reactions   Tape Other (See Comments)    Paper tape ONLY, please!! Not allergic, sensitive   Ace Inhibitors Cough   Ambien [Zolpidem] Other (See Comments)    "Odd feeling"   Crestor [Rosuvastatin] Other (See Comments)    Sore muscles   Leflunomide Other (See Comments)    Elevated LFTs   Pravastatin Other (See Comments)    Sore muscles   Prozac [Fluoxetine Hcl] Other (See Comments)    Decreased libido   Zoloft [Sertraline Hcl] Other (See Comments)    Reaction not recalled    Patient Measurements: Height: 5\' 2"  (157.5 cm) Weight: 58.7 kg (129 lb 6.6 oz) IBW/kg (Calculated) : 50.1 Heparin Dosing Weight: [facility] ED reports height 5'2 and weight 135 (61 kg)  Vital Signs: Temp: 98.4 F (36.9 C) (03/13 2041) Temp Source: Oral (03/13 2041) BP: 158/84 (03/14 0800) Pulse Rate: 116 (03/14 0800)  Labs: Recent Labs    01/18/24 2228 01/19/24 0025 01/19/24 0651 01/19/24 0835 01/19/24 2134 01/27/2024 0056 01/31/2024 0705  HGB 8.1*  --   --  6.4*  --  10.0* 9.5*  HCT 25.4*  --   --  19.6*  --  29.5* 28.7*  PLT 15* 24*  --  41* 30* 26* 25*  APTT  --  34 37*  --  34  --   --   LABPROT  --  14.5 15.2  --  14.6  --   --   INR  --  1.1 1.2  --  1.1  --   --   HEPARINUNFRC  --   --   --   --   --   --  0.37  CREATININE 0.79  --  0.76  --   --  0.69  --   TROPONINIHS 294*  --  357* 364* 687* 764* 662*    Estimated Creatinine Clearance: 47.3 mL/min (by C-G formula based on SCr of 0.69 mg/dL).   Medical History: Past Medical History:  Diagnosis Date   Anemia    Hx of    Arthritis    Calculus of bile duct without mention of cholecystitis or obstruction    Cataract    GERD (gastroesophageal reflux disease)    H. pylori infection    Hx of    Heart murmur    Hyperlipidemia    Hypertension    Hypothyroid    PUD (peptic ulcer disease)    Rheumatoid  arthritis(714.0)     Medications:  Scheduled:   sodium chloride   Intravenous Once   sodium chloride   Intravenous Once   Chlorhexidine Gluconate Cloth  6 each Topical Daily   folic acid  1 mg Oral TID   guaiFENesin  600 mg Oral BID   leptospermum manuka honey  1 Application Topical Daily   levothyroxine  100 mcg Oral Daily   pantoprazole (PROTONIX) IV  40 mg Intravenous Q12H   sodium chloride flush  3 mL Intravenous Q12H   sodium chloride flush  3 mL Intravenous Q12H   Infusions:   sodium chloride 75 mL/hr at 01/31/2024 0000   azithromycin     cefTRIAXone (ROCEPHIN)  IV     heparin 1,000 Units/hr (01/10/2024 0000)   magnesium sulfate bolus IVPB     sodium PHOSPHATE IVPB (in mmol)      Assessment: 16 yoF presented on 3/12 with weakness, cough, chest pain, hypoxia.  CTa with PE, but also noted to have significant pancytopenia.  PMH is significant for hypothyroidism, anemia, PUD, RA on methotrexate and Plaquenil, HTN, hip fracture s/p IM nailing (07/15/23), and recent TKA (09/2023, ASA 81 mg BID x1 month).  Pharmacy is consulted to dose heparin, but held initially for thrombocytopenia.  Per discussion with Dr. Wynona Neat on 3/13, start heparin once transfusion of Plt and PRBC are completed.    Today, 01/13/2024 HL 0.37, therapeutic  Hgb 9.5 low but stable Plt 25. Discussed about continuing heparin drip with such a low plt count. MD says pt difficult situation as needs heparin while also being pancytopenic. Pt not actively bleeding so decided to continue the heparin drip for now. Decision was made to aim for lower goal 0.3 to 0.5 while also maintaining no bolus as well.  Platelet transfusion ordered   Platelet counts: 3/12 22:28:  Plt 15 3/13 00:25 Plt 24.  2 units Plt ordered, but not transfused yet.   3/13 08:35 Plt 41 3/14 07:05 25, plt transfusion ordered   Goal of Therapy:  Heparin level 0.3-0.5 units/ml Monitor platelets by anticoagulation protocol: Yes    Plan:   No heparin  bolus.  Continue heparin IV infusion at 1000 units/hr   Daily heparin level and CBC once stable Continue to monitor H&H and platelets, s/s bleeding.   Adalberto Cole, PharmD, BCPS 01/26/2024 8:34 AM

## 2024-02-07 NOTE — Progress Notes (Signed)
 MD and family at bedside, want to make the patient comfort care and stop doing all other interventions.

## 2024-02-07 NOTE — Progress Notes (Signed)
 Time of death, 2 RN verified, MD notified

## 2024-02-07 NOTE — Progress Notes (Addendum)
 NAME:  [name], MRN:  [id number], DOB:  06-20-1946, LOS: 1 ADMISSION DATE:  01/18/2024, CONSULTATION DATE: 01/19/2024 REFERRING MD: Dr. [place], CHIEF COMPLAINT: Pulmonary embolism  History of Present Illness:  Was brought into the hospital with progressive weakness, shortness of breath generalized malaise Gradually worsening overall health status over the last 3 to 4 months Cough, shortness of breath the last few days Lower extremity swelling,  Evaluation did reveal pulmonary embolism, no evidence of right heart strain, moderate embolic burden Consolidation of the bases of the lungs worse on the right  She has a history of rheumatoid arthritis on methotrexate and Plaquenil follows with Dr. Nickola Major.  Underwent recent left knee replacement, was ambulatory after surgery before she developed progressive weakness  Pertinent  Medical History   Past Medical History:  Diagnosis Date   Anemia    Hx of    Arthritis    Calculus of bile duct without mention of cholecystitis or obstruction    Cataract    GERD (gastroesophageal reflux disease)    H. pylori infection    Hx of    Heart murmur    Hyperlipidemia    Hypertension    Hypothyroid    PUD (peptic ulcer disease)    Rheumatoid arthritis(714.0)    Significant Hospital Events: Including procedures, antibiotic start and stop dates in addition to other pertinent events   3/13 CT chest-PE with moderate clot burden, extensive bilateral reticulonodular infiltrate  Interim History / Subjective:   Has required more oxygen over the last 24 hours progressed to 15 L high flow nasal cannula plus nonrebreather. Tachycardic Afebrile  Objective   Blood pressure (!) 158/84, pulse (!) 116, temperature 98.4 F (36.9 C), temperature source Oral, resp. rate (!) 26, height 5\' 2"  (1.575 m), weight 58.7 kg, SpO2 97%.        Intake/Output Summary (Last 24 hours) at 01/14/2024 0844 Last data filed at 01/30/2024 0000 Gross per 24 hour   Intake 290.55 ml  Output --  Net 290.55 ml   Filed Weights   01/19/24 1608 01/19/24 2041  Weight: 61.2 kg 58.7 kg    Examination: General: Appears frail, chronically ill-appearing elderly woman on HFNC and nonrebreather HENT: Dry oral mucosa Lungs: Crackles at bases, mild accessory muscle use Cardiovascular: S1-S2 tacky Abdomen: Soft, bowel sounds appreciated Extremities: No clubbing, no edema Neuro: Easily arousable, Follows commands  Labs show hyponatremia 127, hypophosphatemia, hypomagnesemia, BNP high at 1741, leukopenia 0.9, thrombocytopenia and hemoglobin improved from 6.4-9.5   Resolved Hospital Problem list     Assessment & Plan:   Acute hypoxic respiratory failure, increasing oxygen requirements Community-acquired pneumonia versus RA/ILD although history appears acute Blood cultures showing staph 2/4, not aureus, not epidermidis -Azithromycin, ceftriaxone -may change to Ancef -Follow cultures -MRSA PCR negative -Respiratory viral panel neg -Consider empiric steroids -HHFNC  Pancytopenia -Concerned with pancytopenia being related to methotrexate and Plaquenil as outpatient -Transfusion pending -Consult hematology, methotrexate has been stopped  Acute pulmonary embolism with pulmonary infarction Bilateral lower extremity DVT Provoked - Recent hip arthroplasty left knee replacement Doubt HIT, platelets 1 61K 10/27/2023 -Continue heparin anticoagulation per protocol -Transfuse platelets   Demand ischemia -troponin peaked at 760 -Monitor telemetry -Await echo  Electrolyte derangement -Replete hypophosphatemia, hypomagnesemia and monitor sodium  Hypertension -Not on any medications recently  Hypothyroidism -Continue home Synthroid  History of rheumatoid arthritis -holding methotrexate and Plaquenil   My independent critical care time was 31 minutes   Cyril Mourning MD. FCCP. [facility] Pager : [phone]  If no response  to pager , please call [phone] until 7 pm After 7:00 pm call Elink  [phone]   01/22/2024

## 2024-02-07 NOTE — Death Summary Note (Signed)
 DEATH SUMMARY   Patient Details  Name: [name] MRN: [id number] DOB: 1946/04/27 WUJ:[id number], Tammie Noa, MD Admission/Discharge Information   Admit Date:  01/28/24  Date of Death: Date of Death: 01/30/2024  Time of Death: Time of Death: 1142/02/01  Length of Stay: 1   Principle Cause of death:  Acute hypoxic respiratory failure Multifocal pneumonia Pulmonary embolism Bilateral lower extremity DVT Severe pancytopenia with thrombocytopenia NSTEMI Staph bacteremia  Hospital Diagnoses: Principal Problem:   Sepsis due to pneumonia [facility]) Active Problems:   Acute on chronic anemia   Pancytopenia (HCC)   Elevated troponin   Consumptive coagulopathy (HCC)   Acute pulmonary embolism (HCC)   Essential hypertension   Rheumatoid arthritis (HCC)   Hyperlipidemia   Hypothyroidism   Hyponatremia   Osteoarthritis, knee   Hypokalemia   History of present illness:  [name] is a 78 y.o. female with past medical history significant for HTN, HLD, prediabetes, hypothyroidism, vitamin D deficiency, osteoarthritis left knee with recent total knee arthroplasty 09/14/2023,  rheumatoid arthritis on methotrexate/Plaquenil outpatient, bedbound/wheelchair dependent for several months who presented to [facility] ED on 2024/01/28 from home via ambulance with progressive weakness, shortness of breath, generalized malaise over the last week.  Husband present who contributes to HPI.  Over the last 3-4 months patient has had gradually worsening diffuse weakness now not able to stand or hold her weight.  Additionally patient has developed a cough, shortness of breath, decreased oral intake and occasional vomiting during the last week.  Husband has also noted swelling in her right leg, noted to be present following surgery but now progressed.  On arrival of EMS, oxygen saturation noted to be 88% on room air.  Does not utilize inhalers or oxygen at home.  Does not utilize anticoagulation or  antiplatelets outpatient.  Patient denies fever, chills, no current nausea/vomiting, no hematemesis or melena.   In the ED, temperature 98.6 F, HR 107, RR 28, BP 131/84, SpO2 93% on 5 L nasal cannula.  WBC 1.1, hemoglobin 8.1, platelet count 15.  Sodium 132, potassium 3.0, chloride 96, CO2 24, glucose 119, BUN 8, creatinine 0.79.  AST 43, ALT 32, total bilirubin 1.8.  High sensitive troponin 294.  D-dimer 2.19.  COVID/influenza/RSV PCR negative.  CT head without contrast with no acute intracranial abnormality, generalized atrophy and findings of chronic microvascular disease.  CT angiogram chest with acute pulmonary embolism with moderate embolic burden no evidence of right heart strain, extensive reticular nodular infiltrate throughout the lungs bilaterally with more focal consolidation with the right lower lobe possible related to extensive pneumonic consolidation with superimposed infiltrate related to pulmonary infarct, small right/trace left pleural effusions, moderate left anterior descending coronary artery calcification, mild cardiomegaly. 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  EKG with sinus tachycardia, rate 102.  Patient was started on IV vancomycin and cefepime.  TRH consulted for admission for further evaluation and management of pneumonia, leukopenia, thrombocytopenia, anemia, hyponatremia, hypokalemia, pulmonary embolism.  Hospital course:  Acute hypoxic respiratory failure Multifocal pneumonia Acute pulmonary embolism with pulmonary infarction Bilateral lower extremity DVT Severe pancytopenia with thrombocytopenia NSTEMI Staph bacteremia Patient presenting to ED with progressive cough, shortness of breath and lower extremity edema.  Has been relatively bedbound since having total knee arthroplasty back in November 2024.  Patient with elevated D-dimer on admission with CT angiogram chest notable for  acute pulmonary embolism with moderate embolic burden with no evidence of right heart strain.  Vascular duplex ultrasound positive for acute DVT bilateral lower extremities.  Patient with significant pancytopenia with WBC count 1.1, hemoglobin 8.1 with platelet count 15 on admission.  Hematology and PCCM were consulted and followed during hospital course.  Patient was transfused multiple units of platelets and PRBCs in order to initiate heparin drip.  Additionally patient is troponin was elevated and peaked at 764, likely secondary to type II demand ischemia but noted significant calcifications on imaging of LAD.  Patient was supported with empiric antibiotics and blood cultures 2 out of 4 returned back with positive for staph species.  Despite aggressive measures, patient continued to deteriorate with increasing oxygen needs up to 45 L high flow nasal cannula.  Given patient's poor response to treatment, with significant deterioration and relatively bedbound over the last several months at home, family at bedside agreed to transition care to comfort measures.  Patient passed with family present at bedside on 02/04/2024 at 11:43 AM.    Consultations:  PCCM Hematology Discussed with cardiology  The results of significant diagnostics from this hospitalization (including imaging, microbiology, ancillary and laboratory) are listed below for reference.   Significant Diagnostic Studies: DG CHEST PORT 1 VIEW Result Date: 01/14/2024 CLINICAL DATA:  Shortness of breath. EXAM: PORTABLE CHEST 1 VIEW COMPARISON:  January 18, 2024. FINDINGS: Stable cardiomegaly. Significant increased bilateral reticular and airspace opacities are noted most consistent with worsening pneumonia or possibly edema. Small pleural effusions are noted. Status post right shoulder arthroplasty. IMPRESSION: Significantly increased bilateral lung opacities as noted above. Electronically Signed   By: Lupita Raider M.D.   On: 01/19/2024 10:55    VAS [place] LOWER EXTREMITY VENOUS (DVT) Result Date: 01/19/2024  Lower Venous DVT Study Patient Name:  [name]  Date of Exam:   01/19/2024 Medical Rec #: [id number]           Accession #:    [id number] Date of Birth: April 19, 1946            Patient Gender: F Patient Age:   89 years Exam Location:  [facility] Procedure:      VAS [place] LOWER EXTREMITY VENOUS (DVT) Referring Phys: Tereasa Coop --------------------------------------------------------------------------------  Indications: Swelling, Edema, SOB, pulmonary embolism, and Weakness, right sided chest pain, knee replacement surgery in 2024.  Risk Factors: Confirmed PE. Limitations: Involuntary movements. Comparison Study: No prior exam. Performing Technologist: Fernande Bras  Examination Guidelines: A complete evaluation includes B-mode imaging, spectral Doppler, color Doppler, and power Doppler as needed of all accessible portions of each vessel. Bilateral testing is considered an integral part of a complete examination. Limited examinations for reoccurring indications may be performed as noted. The reflux portion of the exam is performed with the patient in reverse Trendelenburg.  +---------+---------------+---------+-----------+----------+--------------+ RIGHT    CompressibilityPhasicitySpontaneityPropertiesThrombus Aging +---------+---------------+---------+-----------+----------+--------------+ CFV      Full           No       Yes                                 +---------+---------------+---------+-----------+----------+--------------+ SFJ      Full           Yes      Yes                                 +---------+---------------+---------+-----------+----------+--------------+ FV Prox  Full                                                        +---------+---------------+---------+-----------+----------+--------------+  FV Mid   Full                                                         +---------+---------------+---------+-----------+----------+--------------+ FV DistalFull                                                        +---------+---------------+---------+-----------+----------+--------------+ PFV      Full                                                        +---------+---------------+---------+-----------+----------+--------------+ POP      None           No       No                                  +---------+---------------+---------+-----------+----------+--------------+ PTV      Full                                                        +---------+---------------+---------+-----------+----------+--------------+ PERO     None           No       No                                  +---------+---------------+---------+-----------+----------+--------------+ Gastroc  None           No       No                                  +---------+---------------+---------+-----------+----------+--------------+   +---------+---------------+---------+-----------+----------+-------------------+ LEFT     CompressibilityPhasicitySpontaneityPropertiesThrombus Aging      +---------+---------------+---------+-----------+----------+-------------------+ CFV      Full           Yes      Yes                                      +---------+---------------+---------+-----------+----------+-------------------+ SFJ      Full           Yes      Yes                                      +---------+---------------+---------+-----------+----------+-------------------+ FV Prox  Full                                                             +---------+---------------+---------+-----------+----------+-------------------+  FV Mid   Full                                                             +---------+---------------+---------+-----------+----------+-------------------+ FV DistalFull                                                              +---------+---------------+---------+-----------+----------+-------------------+ PFV      Full                                                             +---------+---------------+---------+-----------+----------+-------------------+ POP      None           No       No                                       +---------+---------------+---------+-----------+----------+-------------------+ PTV                                                   Not well seen,                                                            patent by color.    +---------+---------------+---------+-----------+----------+-------------------+ PERO                                                  Not well seen,                                                            patent by color.    +---------+---------------+---------+-----------+----------+-------------------+ Gastroc  None           No       No                                       +---------+---------------+---------+-----------+----------+-------------------+     Summary: RIGHT: - Findings consistent with acute deep vein thrombosis involving the right popliteal vein, and right peroneal veins. Findings consistent with acute intramuscular thrombosis involving the right gastrocnemius veins. - No cystic structure found  in the popliteal fossa.  LEFT: - Findings consistent with acute deep vein thrombosis involving the left popliteal vein. Findings consistent with acute intramuscular thrombosis involving the left gastrocnemius veins. - No cystic structure found in the popliteal fossa.  *See table(s) above for measurements and observations. Electronically signed by Gerarda Fraction on 01/19/2024 at 11:23:25 AM.    Final    CT Angio Chest PE W and/or Wo Contrast Result Date: 01/19/2024 CLINICAL DATA:  Pulmonary embolism (PE) suspected, high prob; Abdominal pain, acute, nonlocalized. Neutropenia, dyspnea, cough, melena, elevated D-dimer  EXAM: CT ANGIOGRAPHY CHEST CT ABDOMEN AND PELVIS WITH CONTRAST TECHNIQUE: Multidetector CT imaging of the chest was performed using the standard protocol during bolus administration of intravenous contrast. Multiplanar CT image reconstructions and MIPs were obtained to evaluate the vascular anatomy. Multidetector CT imaging of the abdomen and pelvis was performed using the standard protocol during bolus administration of intravenous contrast. RADIATION DOSE REDUCTION: This exam was performed according to the departmental dose-optimization program which includes automated exposure control, adjustment of the mA and/or kV according to patient size and/or use of iterative reconstruction technique. CONTRAST:  OMNIPAQUE IOHEXOL 350 MG/ML SOLN COMPARISON:  None Available. FINDINGS: CTA CHEST FINDINGS Cardiovascular: Mood there is adequate opacification of the pulmonary arterial tree. There are multiple intraluminal filling defects identified within multiple segmental pulmonary arteries of the right upper lobe, right lower lobe left lobe keeping with changes of acute pulmonary embolus. The embolic burden is moderate. The central pulmonary arteries are enlarged in keeping with changes of pulmonary arterial hypertension. There is, however, no CT evidence of right heart strain. Moderate left anterior descending coronary artery calcification. Global cardiac size is mildly enlarged. No pericardial effusion. Mild atherosclerotic calcification within the thoracic aorta. No aortic aneurysm. Mediastinum/Nodes: Shotty mediastinal adenopathy may be reactive in nature. No frankly pathologic thoracic adenopathy. 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 Esophagus unremarkable. Lungs/Pleura: There is extensive reticulonodular infiltrate throughout the lungs bilaterally with more focal consolidation within right lower lobe, possibly related to extensive pneumonic consolidation with superimposed infiltrate related to pulmonary infarct.  Small right and trace left pleural effusions are present. No pneumothorax. Musculoskeletal: Osseous structures are age-appropriate. No acute bone abnormality. Review of the MIP images confirms the above findings. CT ABDOMEN and PELVIS FINDINGS Hepatobiliary: Multiple cysts are seen scattered throughout the liver. Status post cholecystectomy. No intra or extrahepatic biliary ductal dilation. Pancreas: Unremarkable Spleen: Unremarkable Adrenals/Urinary Tract: Adrenal glands are unremarkable. Kidneys are normal, without renal calculi, focal lesion, or hydronephrosis. Bladder is unremarkable. 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 Moderate sigmoid diverticulosis. Stomach, small bowel, and large bowel are otherwise unremarkable. Mild ascites. No free intraperitoneal gas. Vascular/Lymphatic: Aortic atherosclerosis. No enlarged abdominal or pelvic lymph nodes. Reproductive: Status post hysterectomy. No adnexal masses. Other: No abdominal wall hernia Musculoskeletal: Status post left hip ORIF. Degenerative changes are seen within the lumbar spine. No acute bone abnormality. No lytic or blastic bone lesion. Review of the MIP images confirms the above findings. IMPRESSION: 1. Acute pulmonary embolus with moderate embolic burden. No CT evidence of right heart strain. 2. Extensive reticulonodular infiltrate throughout the lungs bilaterally with more focal consolidation within the right lower lobe, possibly related to extensive pneumonic consolidation with superimposed infiltrate related to pulmonary infarct. 3. Small right and trace left pleural effusions. 4. Moderate left anterior descending coronary artery calcification. Mild cardiomegaly. 5. Mild ascites. 6. Moderate sigmoid diverticulosis. 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 Aortic Atherosclerosis (ICD10-I70.0). Electronically Signed   By: Gloris Ham  Ramiro Harvest M.D.   On: 01/19/2024 02:32   CT ABDOMEN PELVIS W CONTRAST Result  Date: 01/19/2024 CLINICAL DATA:  Pulmonary embolism (PE) suspected, high prob; Abdominal pain, acute, nonlocalized. Neutropenia, dyspnea, cough, melena, elevated D-dimer EXAM: CT ANGIOGRAPHY CHEST CT ABDOMEN AND PELVIS WITH CONTRAST TECHNIQUE: Multidetector CT imaging of the chest was performed using the standard protocol during bolus administration of intravenous contrast. Multiplanar CT image reconstructions and MIPs were obtained to evaluate the vascular anatomy. Multidetector CT imaging of the abdomen and pelvis was performed using the standard protocol during bolus administration of intravenous contrast. RADIATION DOSE REDUCTION: This exam was performed according to the departmental dose-optimization program which includes automated exposure control, adjustment of the mA and/or kV according to patient size and/or use of iterative reconstruction technique. CONTRAST:  OMNIPAQUE IOHEXOL 350 MG/ML SOLN COMPARISON:  None Available. FINDINGS: CTA CHEST FINDINGS Cardiovascular: Mood there is adequate opacification of the pulmonary arterial tree. There are multiple intraluminal filling defects identified within multiple segmental pulmonary arteries of the right upper lobe, right lower lobe left lobe keeping with changes of acute pulmonary embolus. The embolic burden is moderate. The central pulmonary arteries are enlarged in keeping with changes of pulmonary arterial hypertension. There is, however, no CT evidence of right heart strain. Moderate left anterior descending coronary artery calcification. Global cardiac size is mildly enlarged. No pericardial effusion. Mild atherosclerotic calcification within the thoracic aorta. No aortic aneurysm. Mediastinum/Nodes: Shotty mediastinal adenopathy may be reactive in nature. No frankly pathologic thoracic adenopathy. 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 Esophagus unremarkable. Lungs/Pleura: There is extensive reticulonodular infiltrate throughout the lungs bilaterally  with more focal consolidation within right lower lobe, possibly related to extensive pneumonic consolidation with superimposed infiltrate related to pulmonary infarct. Small right and trace left pleural effusions are present. No pneumothorax. Musculoskeletal: Osseous structures are age-appropriate. No acute bone abnormality. Review of the MIP images confirms the above findings. CT ABDOMEN and PELVIS FINDINGS Hepatobiliary: Multiple cysts are seen scattered throughout the liver. Status post cholecystectomy. No intra or extrahepatic biliary ductal dilation. Pancreas: Unremarkable Spleen: Unremarkable Adrenals/Urinary Tract: Adrenal glands are unremarkable. Kidneys are normal, without renal calculi, focal lesion, or hydronephrosis. Bladder is unremarkable. 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 Moderate sigmoid diverticulosis. Stomach, small bowel, and large bowel are otherwise unremarkable. Mild ascites. No free intraperitoneal gas. Vascular/Lymphatic: Aortic atherosclerosis. No enlarged abdominal or pelvic lymph nodes. Reproductive: Status post hysterectomy. No adnexal masses. Other: No abdominal wall hernia Musculoskeletal: Status post left hip ORIF. Degenerative changes are seen within the lumbar spine. No acute bone abnormality. No lytic or blastic bone lesion. Review of the MIP images confirms the above findings. IMPRESSION: 1. Acute pulmonary embolus with moderate embolic burden. No CT evidence of right heart strain. 2. Extensive reticulonodular infiltrate throughout the lungs bilaterally with more focal consolidation within the right lower lobe, possibly related to extensive pneumonic consolidation with superimposed infiltrate related to pulmonary infarct. 3. Small right and trace left pleural effusions. 4. Moderate left anterior descending coronary artery calcification. Mild cardiomegaly. 5. Mild ascites. 6. Moderate sigmoid diverticulosis. 7. Duodenal malrotation without evidence  of obstruction. Aortic Atherosclerosis (ICD10-I70.0). Electronically Signed   By: Helyn Numbers M.D.   On: 01/19/2024 02:32   CT Head Wo Contrast Result Date: 01/19/2024 CLINICAL DATA:  Altered mental status EXAM: CT HEAD WITHOUT CONTRAST TECHNIQUE: Contiguous axial images were obtained from the base of the skull through the vertex without intravenous contrast. RADIATION DOSE REDUCTION: This exam was performed according to the departmental dose-optimization  program which includes automated exposure control, adjustment of the mA and/or kV according to patient size and/or use of iterative reconstruction technique. COMPARISON:  09/20/2005 FINDINGS: Brain: There is no mass, hemorrhage or extra-axial collection. There is generalized atrophy without lobar predilection. Hypodensity of the white matter is most commonly associated with chronic microvascular disease. Vascular: Atherosclerotic calcification of the internal carotid arteries at the skull base. No abnormal hyperdensity of the major intracranial arteries or dural venous sinuses. Skull: The visualized skull base, calvarium and extracranial soft tissues are normal. Sinuses/Orbits: No fluid levels or advanced mucosal thickening of the visualized paranasal sinuses. No mastoid or middle ear effusion. Normal orbits. Other: None. IMPRESSION: 1. No acute intracranial abnormality. 2. Generalized atrophy and findings of chronic microvascular disease. Electronically Signed   By: Deatra Robinson M.D.   On: 01/19/2024 02:01   DG Chest Port 1 View Result Date: 01/18/2024 CLINICAL DATA:  Shortness of breath. EXAM: PORTABLE CHEST 1 VIEW COMPARISON:  07/06/2023 FINDINGS: The heart is enlarged. Diffuse interstitial opacities throughout both lungs with more patchy opacity at the lung bases. No pneumothorax. There may be a right pleural effusion. IMPRESSION: Cardiomegaly with diffuse interstitial opacities and more patchy opacity at the lung bases, may represent pulmonary edema,  multifocal pneumonia, or combination there. Possible right pleural effusion. Electronically Signed   By: Narda Rutherford M.D.   On: 01/18/2024 23:08    Microbiology: Recent Results (from the past 240 hours)  Resp panel by RT-PCR (RSV, Flu A&B, Covid) Anterior Nasal Swab     Status: None   Collection Time: 01/18/24  9:29 PM   Specimen: Anterior Nasal Swab  Result Value Ref Range Status   SARS Coronavirus 2 by RT PCR NEGATIVE NEGATIVE Final    Comment: (NOTE) SARS-CoV-2 target nucleic acids are NOT DETECTED.  The SARS-CoV-2 RNA is generally detectable in upper respiratory specimens during the acute phase of infection. The lowest concentration of SARS-CoV-2 viral copies this assay can detect is 138 copies/mL. A negative result does not preclude SARS-Cov-2 infection and should not be used as the sole basis for treatment or other patient management decisions. A negative result may occur with  improper specimen collection/handling, submission of specimen other than nasopharyngeal swab, presence of viral mutation(s) within the areas targeted by this assay, and inadequate number of viral copies(<138 copies/mL). A negative result must be combined with clinical observations, patient history, and epidemiological information. The expected result is Negative.  Fact Sheet for Patients:  BloggerCourse.com  Fact Sheet for Healthcare Providers:  SeriousBroker.it  This test is no t yet approved or cleared by the [place] FDA and  has been authorized for detection and/or diagnosis of SARS-CoV-2 by FDA under an Emergency Use Authorization (EUA). This EUA will remain  in effect (meaning this test can be used) for the duration of the COVID-19 declaration under Section 564(b)(1) of the Act, 21 U.S.C.section 360bbb-3(b)(1), unless the authorization is terminated  or revoked sooner.       Influenza A by PCR NEGATIVE NEGATIVE Final   Influenza B  by PCR NEGATIVE NEGATIVE Final    Comment: (NOTE) The Xpert Xpress SARS-CoV-2/FLU/RSV plus assay is intended as an aid in the diagnosis of influenza from Nasopharyngeal swab specimens and should not be used as a sole basis for treatment. Nasal washings and aspirates are unacceptable for Xpert Xpress SARS-CoV-2/FLU/RSV testing.  Fact Sheet for Patients: BloggerCourse.com  Fact Sheet for Healthcare Providers: SeriousBroker.it  This test is not yet approved or cleared by the [place]  States FDA and has been authorized for detection and/or diagnosis of SARS-CoV-2 by FDA under an Emergency Use Authorization (EUA). This EUA will remain in effect (meaning this test can be used) for the duration of the COVID-19 declaration under Section 564(b)(1) of the Act, 21 U.S.C. section 360bbb-3(b)(1), unless the authorization is terminated or revoked.     Resp Syncytial Virus by PCR NEGATIVE NEGATIVE Final    Comment: (NOTE) Fact Sheet for Patients: BloggerCourse.com  Fact Sheet for Healthcare Providers: SeriousBroker.it  This test is not yet approved or cleared by the [place] FDA and has been authorized for detection and/or diagnosis of SARS-CoV-2 by FDA under an Emergency Use Authorization (EUA). This EUA will remain in effect (meaning this test can be used) for the duration of the COVID-19 declaration under Section 564(b)(1) of the Act, 21 U.S.C. section 360bbb-3(b)(1), unless the authorization is terminated or revoked.  Performed at [facility], [address] [place], [place] [address]   Respiratory (~20 pathogens) panel by PCR     Status: None   Collection Time: 01/18/24  9:29 PM   Specimen: Nasopharyngeal Swab; Respiratory  Result Value Ref Range Status   Adenovirus NOT DETECTED NOT DETECTED Final   Coronavirus 229E NOT DETECTED NOT DETECTED Final    Comment:  (NOTE) The Coronavirus on the Respiratory Panel, DOES NOT test for the novel  Coronavirus (2019 nCoV)    Coronavirus HKU1 NOT DETECTED NOT DETECTED Final   Coronavirus NL63 NOT DETECTED NOT DETECTED Final   Coronavirus OC43 NOT DETECTED NOT DETECTED Final   Metapneumovirus NOT DETECTED NOT DETECTED Final   Rhinovirus / Enterovirus NOT DETECTED NOT DETECTED Final   Influenza A NOT DETECTED NOT DETECTED Final   Influenza B NOT DETECTED NOT DETECTED Final   Parainfluenza Virus 1 NOT DETECTED NOT DETECTED Final   Parainfluenza Virus 2 NOT DETECTED NOT DETECTED Final   Parainfluenza Virus 3 NOT DETECTED NOT DETECTED Final   Parainfluenza Virus 4 NOT DETECTED NOT DETECTED Final   Respiratory Syncytial Virus NOT DETECTED NOT DETECTED Final   Bordetella pertussis NOT DETECTED NOT DETECTED Final   Bordetella Parapertussis NOT DETECTED NOT DETECTED Final   Chlamydophila pneumoniae NOT DETECTED NOT DETECTED Final   Mycoplasma pneumoniae NOT DETECTED NOT DETECTED Final    Comment: Performed at [facility], [address] [place], [place] [address]  Blood culture (routine x 2)     Status: None (Preliminary result)   Collection Time: 01/19/24 12:05 AM   Specimen: BLOOD  Result Value Ref Range Status   Specimen Description   Final    BLOOD RIGHT ANTECUBITAL Performed at [facility], [address], [place], [place] [address]    Special Requests   Final    BOTTLES DRAWN AEROBIC AND ANAEROBIC Blood Culture results may not be optimal due to an inadequate volume of blood received in culture bottles Performed at [facility], [address] [place], [place] [address]    Culture  Setup Time   Final    GRAM POSITIVE COCCI IN BOTH AEROBIC AND ANAEROBIC BOTTLES CRITICAL RESULT CALLED TO, READ BACK BY AND VERIFIED WITH: [facility] PARMEDIC KATIE L. [id number] AT 1945, ADC    Culture   Final    GRAM POSITIVE COCCI IDENTIFICATION TO FOLLOW Performed at [facility], [address] [place], [place] [address]    Report Status PENDING  Incomplete  Blood Culture ID Panel (Reflexed)  Status: Abnormal   Collection Time: 01/19/24 12:05 AM  Result Value Ref Range Status   Enterococcus faecalis NOT DETECTED NOT DETECTED Final   Enterococcus Faecium NOT DETECTED NOT DETECTED Final   Listeria monocytogenes NOT DETECTED NOT DETECTED Final   Staphylococcus species DETECTED (A) NOT DETECTED Final    Comment: CRITICAL RESULT CALLED TO, READ BACK BY AND VERIFIED WITH: [facility] PAR[facility] KATIE L. [id number] AT 1945, ADC    Staphylococcus aureus (BCID) NOT DETECTED NOT DETECTED Final   Staphylococcus epidermidis NOT DETECTED NOT DETECTED Final   Staphylococcus lugdunensis NOT DETECTED NOT DETECTED Final   Streptococcus species NOT DETECTED NOT DETECTED Final   Streptococcus agalactiae NOT DETECTED NOT DETECTED Final   Streptococcus pneumoniae NOT DETECTED NOT DETECTED Final   Streptococcus pyogenes NOT DETECTED NOT DETECTED Final   A.calcoaceticus-baumannii NOT DETECTED NOT DETECTED Final   Bacteroides fragilis NOT DETECTED NOT DETECTED Final   Enterobacterales NOT DETECTED NOT DETECTED Final   Enterobacter cloacae complex NOT DETECTED NOT DETECTED Final   Escherichia coli NOT DETECTED NOT DETECTED Final   Klebsiella aerogenes NOT DETECTED NOT DETECTED Final   Klebsiella oxytoca NOT DETECTED NOT DETECTED Final   Klebsiella pneumoniae NOT DETECTED NOT DETECTED Final   Proteus species NOT DETECTED NOT DETECTED Final   Salmonella species NOT DETECTED NOT DETECTED Final   Serratia marcescens NOT DETECTED NOT DETECTED Final   Haemophilus influenzae NOT DETECTED NOT DETECTED Final   Neisseria meningitidis NOT DETECTED NOT DETECTED Final   Pseudomonas aeruginosa NOT DETECTED NOT DETECTED Final   Stenotrophomonas maltophilia NOT DETECTED NOT DETECTED Final   Candida albicans NOT DETECTED NOT DETECTED Final   Candida auris NOT DETECTED NOT DETECTED Final    Candida glabrata NOT DETECTED NOT DETECTED Final   Candida krusei NOT DETECTED NOT DETECTED Final   Candida parapsilosis NOT DETECTED NOT DETECTED Final   Candida tropicalis NOT DETECTED NOT DETECTED Final   Cryptococcus neoformans/gattii NOT DETECTED NOT DETECTED Final    Comment: Performed at [facility], [address], [place], [place] [address]  Blood culture (routine x 2)     Status: None (Preliminary result)   Collection Time: 01/19/24  2:03 AM   Specimen: BLOOD LEFT HAND  Result Value Ref Range Status   Specimen Description   Final    BLOOD LEFT HAND Performed at [facility], [address] [place], [place] [address]    Special Requests   Final    BOTTLES DRAWN AEROBIC AND ANAEROBIC Blood Culture results may not be optimal due to an inadequate volume of blood received in culture bottles Performed at [facility], [address] [place], [place] [address]    Culture  Setup Time   Final    GRAM POSITIVE COCCI ANAEROBIC BOTTLE ONLY CRITICAL VALUE NOTED.  VALUE IS CONSISTENT WITH PREVIOUSLY REPORTED AND CALLED VALUE. Performed at [facility], [address], [place], [place] [address]    Culture GRAM POSITIVE COCCI  Final   Report Status PENDING  Incomplete  MRSA Next Gen by PCR, Nasal     Status: None   Collection Time: 01/19/24  4:07 AM   Specimen: Nasal Mucosa; Nasal Swab  Result Value Ref Range Status   MRSA by PCR Next Gen NOT DETECTED NOT DETECTED Final    Comment: (NOTE) The GeneXpert MRSA Assay (FDA approved for NASAL specimens only), is one component of a comprehensive MRSA colonization surveillance program. It is not intended to diagnose MRSA  infection nor to guide or monitor treatment for MRSA infections. Test performance is not FDA approved in patients less than 77 years old. Performed at [facility], [address], [place], [place] [address]     Time spent: 32 minutes  Signed: Alvira Philips [place],  DO 01/27/2024

## 2024-02-07 NOTE — Progress Notes (Addendum)
 PROGRESS NOTE    [name]  LKG:[id number] DOB: 11-Sep-1946 DOA: 01/18/2024 PCP: Lucky Cowboy, MD    Brief Narrative:   [name] is a 78 y.o. female with past medical history significant for HTN, HLD, prediabetes, hypothyroidism, vitamin D deficiency, osteoarthritis left knee with recent total knee arthroplasty 09/14/2023,  rheumatoid arthritis on methotrexate/Plaquenil outpatient, bedbound/wheelchair dependent for several months who presented to [facility] ED on 01/18/2024 from home via ambulance with progressive weakness, shortness of breath, generalized malaise over the last week.  Husband present who contributes to HPI.  Over the last 3-4 months patient has had gradually worsening diffuse weakness now not able to stand or hold her weight.  Additionally patient has developed a cough, shortness of breath, decreased oral intake and occasional vomiting during the last week.  Husband has also noted swelling in her right leg, noted to be present following surgery but now progressed.  On arrival of EMS, oxygen saturation noted to be 88% on room air.  Does not utilize inhalers or oxygen at home.  Does not utilize anticoagulation or antiplatelets outpatient.  Patient denies fever, chills, no current nausea/vomiting, no hematemesis or melena.  In the ED, temperature 98.6 F, HR 107, RR 28, BP 131/84, SpO2 93% on 5 L nasal cannula.  WBC 1.1, hemoglobin 8.1, platelet count 15.  Sodium 132, potassium 3.0, chloride 96, CO2 24, glucose 119, BUN 8, creatinine 0.79.  AST 43, ALT 32, total bilirubin 1.8.  High sensitive troponin 294.  D-dimer 2.19.  COVID/influenza/RSV PCR negative.  CT head without contrast with no acute intracranial abnormality, generalized atrophy and findings of chronic microvascular disease.  CT angiogram chest with acute pulmonary embolism with moderate embolic burden no evidence of right heart strain, extensive reticular nodular infiltrate throughout the lungs  bilaterally with more focal consolidation with the right lower lobe possible related to extensive pneumonic consolidation with superimposed infiltrate related to pulmonary infarct, small right/trace left pleural effusions, moderate left anterior descending coronary artery calcification, mild cardiomegaly. 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  EKG with sinus tachycardia, rate 102.  Patient was started on IV vancomycin and cefepime.  TRH consulted for admission for further evaluation and management of pneumonia, leukopenia, thrombocytopenia, anemia, hyponatremia, hypokalemia, pulmonary embolism.  Assessment & Plan:   Acute hypoxic respiratory failure, POA Community-acquired pneumonia Concern for interstitial lung disease Patient presenting with cough, shortness of breath over the last week.  Imaging notable for consolidation right lower lobe. -- MRSA PCR: Negative -- Respiratory viral panel: Negative -- Azithromycin 500 mg IV q24h x 5 days -- Ceftriaxone 2 g IV q24h -- Furosemide 40 mg IV every 12 hours -- Solu-Medrol 40 mg IV every 12 hours -- Continue supplemental oxygen, maintain SpO2 > 92%; currently on 5 L [employer] with SpO2 93% at rest -- Mucinex 600 mg p.o. twice daily -- Xopenex neb every 6 hours as needed wheezing/shortness of breath -- CBC daily  Staph bacteremia 2 out of 4 blood cultures positive for staph species. -- Restart vancomycin; pharmacy consulted for dosing/monitoring -- Await further culture identification/susceptibilities -- Echocardiogram pending  Elevated BNP Hx chronic diastolic congestive heart failure; decompensated Previous TTE 2022 with LVEF 60 to 65%, grade 2 diastolic dysfunction.  BNP 1741.6. -- Furosemide 40 mg IV every 12 hours  Acute pulmonary embolism with pulmonary infarction Bilateral lower extremity DVT D-dimer elevated 2.19 on admission.  CT angiogram chest with  acute pulmonary embolism with moderate embolic  burden with no evidence of right heart strain.  Vascular duplex ultrasound bilateral lower extremities with acute DVT right popliteal vein, right peroneal vein, acute intramuscular thrombosis right gastrocnemius vein, left popliteal vein, left gastrocnemius vein.  Etiology likely secondary to poor mobility status as has been relatively bedbound over the last 3-4 months; also with recent surgical intervention with left total knee arthroplasty November 2024 as possible contributing factor.   -- PCCM following, appreciate assistance -- TTE: Pending -- Heparin drip; pharmacy following for dosing/monitoring -- Continue supplemental oxygen, maintain SpO2 >95% -- Monitor on telemetry  Pancytopenia Thrombocytopenia, severe Suspect etiology likely secondary to bone marrow suppression in the setting of methotrexate and Plaquenil use outpatient. --Following, appreciate assistance -- WBC 1.1>0.7>1.0 -- Hgb 8.1>6.4>10.0 -- Plt 15>24>41>30>26 -- s/p 2u Plt 3/13 -- s/p 3u pRBC 3/13 -- transfuse 1u Plt today -- Repeat CBC 2 hours posttransfusion  Elevated troponin likely secondary to type II demand ischemia Patient with elevated troponin, no evidence of ST/T wave abnormality on EKG.  Etiology likely secondary to type II demand ischemia in the setting of anemia, pulmonary embolism.  Discussed with cardiology Dr. Jacques Navy on 3/14, given her overall pancytopenia and currently on heparin drip nothing else to offer at this time. -- hs Troponin 294>357>364>>764>662 -- TTE: pending -- Continue to monitor on telemetry  Hyponatremia Sodium 132 on admission, likely secondary to hypovolemic hyponatremia in the setting of poor oral intake in the days preceding hospitalization. -- Na 132>129>127 -- NS at 75 mL/h -- BMP in am  Hypokalemia Potassium 3.0 on admission, repleted.  Repeat potassium 3.9 this morning. -- Continue to monitor electrolytes closely  Essential  hypertension Currently not on antihypertensives outpatient.  Hypothyroidism Most recent TSH 10.93 on 10/27/2023; repeat TSH on admission 12.100 with free T4 0.77. -- Levothyroxine 100 mcg p.o. daily  History of rheumatoid arthritis Follows with rheumatology outpatient, Dr. Lendon Colonel.  Currently on methotrexate and Plaquenil.  Apparently Plaquenil has been on hold since recent total knee arthroplasty in November 2024.  Last dose of methotrexate on 01/11/2024. --Continue to hold home methotrexate and Plaquenil with suspicion of marrow suppression causing severe anemia and, thrombocytopenia  Goals of care: Patient continues to progressively decline despite aggressive measures with transfusion, IV antibiotics with O2 requirements now up to 45L HFNC.  Overall poor prognosis, palliative care consulted for assistance with goals of care and medical decision making.  Addendum 1109am: Family meeting at bedside, RN present.  Husband wishes not to continue with aggressive measures at this time given her respiratory distress and likely will need intubation shortly given her increased oxygen needs and accessory muscle use.  Patient's husband and family wish to transition to comfort measures.   DVT prophylaxis: SCDs Start: 01/19/24 0207 Place TED hose Start: 01/19/24 0207    Code Status: Full Code Family Communication: Updated patient's spouse present at bedside this morning  Disposition Plan:  Level of care: Stepdown Status is: Inpatient Remains inpatient appropriate because: Worsening respiratory status, currently on 45 L high flow nasal cannula, continues with severe leukopenia, thrombocytopenia.  Prognosis appears poor.  Consultants:  PCCM Hematology  Procedures:  Vascular duplex ultrasound bilateral lower extremities  Antimicrobials:  Vancomycin 3/12 - 3/12 Cefepime 3/12 -3/12 Azithromycin 3/13>> Ceftriaxone 3/13>>   Subjective: Patient seen examined bedside, lying in bed.  Husband  present.  IV team present.  IV team asking to put in a PICC line, unfortunately patient has staph bacteremia which is a contraindication.  Patient now up to 45 L high  flow nasal cannula despite aggressive treatment with antibiotics, IV heparin, and PRBC/platelet infusion.  Discussed with husband that this appears to be overall a poor prognosis given her significant progression since admission.  Discussed with PCCM, Dr. Vassie Loll who will start IV steroids for concern of possible underlying ILD.  Also will start IV Lasix as patient likely volume overload given blood product administration.  Patient confused, respiratory distress and unable to obtain any further ROS.   Objective: Vitals:   01/24/2024 0800 02/02/2024 0840 01/07/2024 1001 02/06/2024 1003  BP: (!) 158/84     Pulse: (!) 116 (!) 103    Resp: (!) 26 (!) 39    Temp:      TempSrc:      SpO2: 97% (!) 88% 91% 91%  Weight:      Height:        Intake/Output Summary (Last 24 hours) at 01/17/2024 1036 Last data filed at 01/19/2024 0800 Gross per 24 hour  Intake 969.98 ml  Output --  Net 969.98 ml   Filed Weights   01/19/24 1608 01/19/24 2041  Weight: 61.2 kg 58.7 kg    Examination:  Physical Exam: GEN: + Respiratory distress, chronically ill, cachectic, pale in appearance HEENT: NCAT, PERRL, EOMI, sclera clear, pale conjunctiva, MMM PULM: Coarse breath sounds bilaterally, diminished bilateral bases with crackles, increased respiratory effort with accessory muscle use, on 45 L high flow nasal cannula CV: Tachycardic, regular rhythm  GI: abd soft, NTND, + BS MSK: + LE edema, right greater than left Integumentary: Right leg wound as depicted below, otherwise no other concerning rashes/lesions/wounds noted on exposed skin surfaces.      Data Reviewed: I have personally reviewed following labs and imaging studies  CBC: Recent Labs  Lab 01/18/24 2228 01/19/24 0025 01/19/24 0835 01/19/24 2134 01/27/2024 0056 01/07/2024 0705  WBC 1.1*  --   0.7*  --  1.0* 0.9*  NEUTROABS 0.1*  --   --   --   --   --   HGB 8.1*  --  6.4*  --  10.0* 9.5*  HCT 25.4*  --  19.6*  --  29.5* 28.7*  MCV 92.0  --  91.2  --  90.8 91.4  PLT 15* 24* 41* 30* 26* 25*   Basic Metabolic Panel: Recent Labs  Lab 01/18/24 2228 01/19/24 0651 01/22/2024 0056  NA 132* 129* 127*  K 3.0* 4.0 3.9  CL 96* 97* 95*  CO2 24 25 21*  GLUCOSE 119* 80 72  BUN 8 8 9   CREATININE 0.79 0.76 0.69  CALCIUM 8.8* 8.3* 8.2*  MG  --   --  1.6*  PHOS  --   --  1.4*   GFR: Estimated Creatinine Clearance: 47.3 mL/min (by C-G formula based on SCr of 0.69 mg/dL). Liver Function Tests: Recent Labs  Lab 01/18/24 2228 01/19/24 0651 01/30/2024 0056  AST 43* 28 41  ALT 32 26 29  ALKPHOS 119 88 115  BILITOT 1.8* 1.9* 2.6*  PROT 5.1* 4.4* 5.1*  ALBUMIN 2.3* 1.9* 2.2*   No results for input(s): "LIPASE", "AMYLASE" in the last 168 hours. No results for input(s): "AMMONIA" in the last 168 hours. Coagulation Profile: Recent Labs  Lab 01/19/24 0025 01/19/24 0651 01/19/24 2134  INR 1.1 1.2 1.1   Cardiac Enzymes: No results for input(s): "CKTOTAL", "CKMB", "CKMBINDEX", "TROPONINI" in the last 168 hours. BNP (last 3 results) No results for input(s): "PROBNP" in the last 8760 hours. HbA1C: No results for input(s): "HGBA1C" in the last  72 hours. CBG: No results for input(s): "GLUCAP" in the last 168 hours. Lipid Profile: No results for input(s): "CHOL", "HDL", "LDLCALC", "TRIG", "CHOLHDL", "LDLDIRECT" in the last 72 hours. Thyroid Function Tests: Recent Labs    02/02/2024 0056  TSH 12.100*  FREET4 0.77   Anemia Panel: Recent Labs    01/19/24 0835 01/19/24 2134  VITAMINB12  --  952*  FOLATE  --  2.3*  FERRITIN  --  1,326*  TIBC  --  120*  IRON  --  34  RETICCTPCT 4.7*  --    Sepsis Labs: Recent Labs  Lab 01/19/24 0034 01/27/2024 0056  PROCALCITON  --  0.91  LATICACIDVEN 1.7  --     Recent Results (from the past 240 hours)  Resp panel by RT-PCR (RSV, Flu  A&B, Covid) Anterior Nasal Swab     Status: None   Collection Time: 01/18/24  9:29 PM   Specimen: Anterior Nasal Swab  Result Value Ref Range Status   SARS Coronavirus 2 by RT PCR NEGATIVE NEGATIVE Final    Comment: (NOTE) SARS-CoV-2 target nucleic acids are NOT DETECTED.  The SARS-CoV-2 RNA is generally detectable in upper respiratory specimens during the acute phase of infection. The lowest concentration of SARS-CoV-2 viral copies this assay can detect is 138 copies/mL. A negative result does not preclude SARS-Cov-2 infection and should not be used as the sole basis for treatment or other patient management decisions. A negative result may occur with  improper specimen collection/handling, submission of specimen other than nasopharyngeal swab, presence of viral mutation(s) within the areas targeted by this assay, and inadequate number of viral copies(<138 copies/mL). A negative result must be combined with clinical observations, patient history, and epidemiological information. The expected result is Negative.  Fact Sheet for Patients:  BloggerCourse.com  Fact Sheet for Healthcare Providers:  SeriousBroker.it  This test is no t yet approved or cleared by the [place] FDA and  has been authorized for detection and/or diagnosis of SARS-CoV-2 by FDA under an Emergency Use Authorization (EUA). This EUA will remain  in effect (meaning this test can be used) for the duration of the COVID-19 declaration under Section 564(b)(1) of the Act, 21 U.S.C.section 360bbb-3(b)(1), unless the authorization is terminated  or revoked sooner.       Influenza A by PCR NEGATIVE NEGATIVE Final   Influenza B by PCR NEGATIVE NEGATIVE Final    Comment: (NOTE) The Xpert Xpress SARS-CoV-2/FLU/RSV plus assay is intended as an aid in the diagnosis of influenza from Nasopharyngeal swab specimens and should not be used as a sole basis for treatment.  Nasal washings and aspirates are unacceptable for Xpert Xpress SARS-CoV-2/FLU/RSV testing.  Fact Sheet for Patients: BloggerCourse.com  Fact Sheet for Healthcare Providers: SeriousBroker.it  This test is not yet approved or cleared by the [place] FDA and has been authorized for detection and/or diagnosis of SARS-CoV-2 by FDA under an Emergency Use Authorization (EUA). This EUA will remain in effect (meaning this test can be used) for the duration of the COVID-19 declaration under Section 564(b)(1) of the Act, 21 U.S.C. section 360bbb-3(b)(1), unless the authorization is terminated or revoked.     Resp Syncytial Virus by PCR NEGATIVE NEGATIVE Final    Comment: (NOTE) Fact Sheet for Patients: BloggerCourse.com  Fact Sheet for Healthcare Providers: SeriousBroker.it  This test is not yet approved or cleared by the [place] FDA and has been authorized for detection and/or diagnosis of SARS-CoV-2 by FDA under an Emergency Use Authorization (EUA). This  EUA will remain in effect (meaning this test can be used) for the duration of the COVID-19 declaration under Section 564(b)(1) of the Act, 21 U.S.C. section 360bbb-3(b)(1), unless the authorization is terminated or revoked.  Performed at [facility], [address], [place], [place] [address]   Respiratory (~20 pathogens) panel by PCR     Status: None   Collection Time: 01/18/24  9:29 PM   Specimen: Nasopharyngeal Swab; Respiratory  Result Value Ref Range Status   Adenovirus NOT DETECTED NOT DETECTED Final   Coronavirus 229E NOT DETECTED NOT DETECTED Final    Comment: (NOTE) The Coronavirus on the Respiratory Panel, DOES NOT test for the novel  Coronavirus (2019 nCoV)    Coronavirus HKU1 NOT DETECTED NOT DETECTED Final   Coronavirus NL63 NOT DETECTED NOT DETECTED Final   Coronavirus OC43 NOT  DETECTED NOT DETECTED Final   Metapneumovirus NOT DETECTED NOT DETECTED Final   Rhinovirus / Enterovirus NOT DETECTED NOT DETECTED Final   Influenza A NOT DETECTED NOT DETECTED Final   Influenza B NOT DETECTED NOT DETECTED Final   Parainfluenza Virus 1 NOT DETECTED NOT DETECTED Final   Parainfluenza Virus 2 NOT DETECTED NOT DETECTED Final   Parainfluenza Virus 3 NOT DETECTED NOT DETECTED Final   Parainfluenza Virus 4 NOT DETECTED NOT DETECTED Final   Respiratory Syncytial Virus NOT DETECTED NOT DETECTED Final   Bordetella pertussis NOT DETECTED NOT DETECTED Final   Bordetella Parapertussis NOT DETECTED NOT DETECTED Final   Chlamydophila pneumoniae NOT DETECTED NOT DETECTED Final   Mycoplasma pneumoniae NOT DETECTED NOT DETECTED Final    Comment: Performed at [facility], [address] [place], [place] [address]  Blood culture (routine x 2)     Status: None (Preliminary result)   Collection Time: 01/19/24 12:05 AM   Specimen: BLOOD  Result Value Ref Range Status   Specimen Description   Final    BLOOD RIGHT ANTECUBITAL Performed at [facility], [address] [place], [place] [address]    Special Requests   Final    BOTTLES DRAWN AEROBIC AND ANAEROBIC Blood Culture results may not be optimal due to an inadequate volume of blood received in culture bottles Performed at [facility], [address] [place], [place] [address]    Culture  Setup Time   Final    GRAM POSITIVE COCCI IN BOTH AEROBIC AND ANAEROBIC BOTTLES CRITICAL RESULT CALLED TO, READ BACK BY AND VERIFIED WITH: [facility] PARMEDIC KATIE L. [id number] AT 1945, ADC    Culture   Final    GRAM POSITIVE COCCI IDENTIFICATION TO FOLLOW Performed at [facility], [address], [place], [place] [address]    Report Status PENDING  Incomplete  Blood Culture ID Panel (Reflexed)     Status: Abnormal   Collection Time: 01/19/24 12:05 AM  Result Value Ref Range Status   Enterococcus  faecalis NOT DETECTED NOT DETECTED Final   Enterococcus Faecium NOT DETECTED NOT DETECTED Final   Listeria monocytogenes NOT DETECTED NOT DETECTED Final   Staphylococcus species DETECTED (A) NOT DETECTED Final    Comment: CRITICAL RESULT CALLED TO, READ BACK BY AND VERIFIED WITH: [facility] PARMEDIC KATIE L. [id number] AT 1945, ADC    Staphylococcus aureus (BCID) NOT DETECTED NOT DETECTED Final   Staphylococcus epidermidis NOT DETECTED NOT DETECTED Final   Staphylococcus lugdunensis NOT DETECTED NOT DETECTED Final   Streptococcus species NOT DETECTED NOT DETECTED Final   Streptococcus agalactiae NOT DETECTED  NOT DETECTED Final   Streptococcus pneumoniae NOT DETECTED NOT DETECTED Final   Streptococcus pyogenes NOT DETECTED NOT DETECTED Final   A.calcoaceticus-baumannii NOT DETECTED NOT DETECTED Final   Bacteroides fragilis NOT DETECTED NOT DETECTED Final   Enterobacterales NOT DETECTED NOT DETECTED Final   Enterobacter cloacae complex NOT DETECTED NOT DETECTED Final   Escherichia coli NOT DETECTED NOT DETECTED Final   Klebsiella aerogenes NOT DETECTED NOT DETECTED Final   Klebsiella oxytoca NOT DETECTED NOT DETECTED Final   Klebsiella pneumoniae NOT DETECTED NOT DETECTED Final   Proteus species NOT DETECTED NOT DETECTED Final   Salmonella species NOT DETECTED NOT DETECTED Final   Serratia marcescens NOT DETECTED NOT DETECTED Final   Haemophilus influenzae NOT DETECTED NOT DETECTED Final   Neisseria meningitidis NOT DETECTED NOT DETECTED Final   Pseudomonas aeruginosa NOT DETECTED NOT DETECTED Final   Stenotrophomonas maltophilia NOT DETECTED NOT DETECTED Final   Candida albicans NOT DETECTED NOT DETECTED Final   Candida auris NOT DETECTED NOT DETECTED Final   Candida glabrata NOT DETECTED NOT DETECTED Final   Candida krusei NOT DETECTED NOT DETECTED Final   Candida parapsilosis NOT DETECTED NOT DETECTED Final   Candida tropicalis NOT DETECTED NOT DETECTED Final   Cryptococcus  neoformans/gattii NOT DETECTED NOT DETECTED Final    Comment: Performed at [facility], [address] [place], [place] [address]  Blood culture (routine x 2)     Status: None (Preliminary result)   Collection Time: 01/19/24  2:03 AM   Specimen: BLOOD LEFT HAND  Result Value Ref Range Status   Specimen Description   Final    BLOOD LEFT HAND Performed at [facility], [address] [place], [place] [address]    Special Requests   Final    BOTTLES DRAWN AEROBIC AND ANAEROBIC Blood Culture results may not be optimal due to an inadequate volume of blood received in culture bottles Performed at [facility], [address] [place], [place] [address]    Culture   Final    NO GROWTH 1 DAY Performed at [facility], [address] [place], [place] [address]    Report Status PENDING  Incomplete  MRSA Next Gen by PCR, Nasal     Status: None   Collection Time: 01/19/24  4:07 AM   Specimen: Nasal Mucosa; Nasal Swab  Result Value Ref Range Status   MRSA by PCR Next Gen NOT DETECTED NOT DETECTED Final    Comment: (NOTE) The GeneXpert MRSA Assay (FDA approved for NASAL specimens only), is one component of a comprehensive MRSA colonization surveillance program. It is not intended to diagnose MRSA infection nor to guide or monitor treatment for MRSA infections. Test performance is not FDA approved in patients less than 7 years old. Performed at [facility], [address], [place], [place] [address]          Radiology Studies: VAS [place] LOWER EXTREMITY VENOUS (DVT) Result Date: 01/19/2024  Lower Venous DVT Study Patient Name:  SHAVONTE ZHAO  Date of Exam:   01/19/2024 Medical Rec #: [id number]           Accession #:    [id number] Date of Birth: 1946-02-03            Patient Gender: F Patient Age:   23 years Exam Location:  [facility] Procedure:      VAS [place] LOWER EXTREMITY VENOUS (DVT) Referring Phys: Tereasa Coop  --------------------------------------------------------------------------------  Indications:  Swelling, Edema, SOB, pulmonary embolism, and Weakness, right sided chest pain, knee replacement surgery in 2024.  Risk Factors: Confirmed PE. Limitations: Involuntary movements. Comparison Study: No prior exam. Performing Technologist: Fernande Bras  Examination Guidelines: A complete evaluation includes B-mode imaging, spectral Doppler, color Doppler, and power Doppler as needed of all accessible portions of each vessel. Bilateral testing is considered an integral part of a complete examination. Limited examinations for reoccurring indications may be performed as noted. The reflux portion of the exam is performed with the patient in reverse Trendelenburg.  +---------+---------------+---------+-----------+----------+--------------+ RIGHT    CompressibilityPhasicitySpontaneityPropertiesThrombus Aging +---------+---------------+---------+-----------+----------+--------------+ CFV      Full           No       Yes                                 +---------+---------------+---------+-----------+----------+--------------+ SFJ      Full           Yes      Yes                                 +---------+---------------+---------+-----------+----------+--------------+ FV Prox  Full                                                        +---------+---------------+---------+-----------+----------+--------------+ FV Mid   Full                                                        +---------+---------------+---------+-----------+----------+--------------+ FV DistalFull                                                        +---------+---------------+---------+-----------+----------+--------------+ PFV      Full                                                        +---------+---------------+---------+-----------+----------+--------------+ POP      None           No       No                                   +---------+---------------+---------+-----------+----------+--------------+ PTV      Full                                                        +---------+---------------+---------+-----------+----------+--------------+ PERO     None           No  No                                  +---------+---------------+---------+-----------+----------+--------------+ Gastroc  None           No       No                                  +---------+---------------+---------+-----------+----------+--------------+   +---------+---------------+---------+-----------+----------+-------------------+ LEFT     CompressibilityPhasicitySpontaneityPropertiesThrombus Aging      +---------+---------------+---------+-----------+----------+-------------------+ CFV      Full           Yes      Yes                                      +---------+---------------+---------+-----------+----------+-------------------+ SFJ      Full           Yes      Yes                                      +---------+---------------+---------+-----------+----------+-------------------+ FV Prox  Full                                                             +---------+---------------+---------+-----------+----------+-------------------+ FV Mid   Full                                                             +---------+---------------+---------+-----------+----------+-------------------+ FV DistalFull                                                             +---------+---------------+---------+-----------+----------+-------------------+ PFV      Full                                                             +---------+---------------+---------+-----------+----------+-------------------+ POP      None           No       No                                       +---------+---------------+---------+-----------+----------+-------------------+ PTV  Not well seen,                                                            patent by color.    +---------+---------------+---------+-----------+----------+-------------------+ PERO                                                  Not well seen,                                                            patent by color.    +---------+---------------+---------+-----------+----------+-------------------+ Gastroc  None           No       No                                       +---------+---------------+---------+-----------+----------+-------------------+     Summary: RIGHT: - Findings consistent with acute deep vein thrombosis involving the right popliteal vein, and right peroneal veins. Findings consistent with acute intramuscular thrombosis involving the right gastrocnemius veins. - No cystic structure found in the popliteal fossa.  LEFT: - Findings consistent with acute deep vein thrombosis involving the left popliteal vein. Findings consistent with acute intramuscular thrombosis involving the left gastrocnemius veins. - No cystic structure found in the popliteal fossa.  *See table(s) above for measurements and observations. Electronically signed by Gerarda Fraction on 01/19/2024 at 11:23:25 AM.    Final    CT Angio Chest PE W and/or Wo Contrast Result Date: 01/19/2024 CLINICAL DATA:  Pulmonary embolism (PE) suspected, high prob; Abdominal pain, acute, nonlocalized. Neutropenia, dyspnea, cough, melena, elevated D-dimer EXAM: CT ANGIOGRAPHY CHEST CT ABDOMEN AND PELVIS WITH CONTRAST TECHNIQUE: Multidetector CT imaging of the chest was performed using the standard protocol during bolus administration of intravenous contrast. Multiplanar CT image reconstructions and MIPs were obtained to evaluate the vascular anatomy. Multidetector CT imaging of the abdomen and pelvis was performed using the standard protocol during bolus administration  of intravenous contrast. RADIATION DOSE REDUCTION: This exam was performed according to the departmental dose-optimization program which includes automated exposure control, adjustment of the mA and/or kV according to patient size and/or use of iterative reconstruction technique. CONTRAST:  OMNIPAQUE IOHEXOL 350 MG/ML SOLN COMPARISON:  None Available. FINDINGS: CTA CHEST FINDINGS Cardiovascular: Mood there is adequate opacification of the pulmonary arterial tree. There are multiple intraluminal filling defects identified within multiple segmental pulmonary arteries of the right upper lobe, right lower lobe left lobe keeping with changes of acute pulmonary embolus. The embolic burden is moderate. The central pulmonary arteries are enlarged in keeping with changes of pulmonary arterial hypertension. There is, however, no CT evidence of right heart strain. Moderate left anterior descending coronary artery calcification. Global cardiac size is mildly enlarged. No pericardial effusion. Mild atherosclerotic calcification within the thoracic aorta. No aortic aneurysm. Mediastinum/Nodes: Shotty mediastinal adenopathy may be reactive in nature. No frankly  pathologic thoracic adenopathy. 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 Esophagus unremarkable. Lungs/Pleura: There is extensive reticulonodular infiltrate throughout the lungs bilaterally with more focal consolidation within right lower lobe, possibly related to extensive pneumonic consolidation with superimposed infiltrate related to pulmonary infarct. Small right and trace left pleural effusions are present. No pneumothorax. Musculoskeletal: Osseous structures are age-appropriate. No acute bone abnormality. Review of the MIP images confirms the above findings. CT ABDOMEN and PELVIS FINDINGS Hepatobiliary: Multiple cysts are seen scattered throughout the liver. Status post cholecystectomy. No intra or extrahepatic biliary ductal dilation. Pancreas: Unremarkable Spleen:  Unremarkable Adrenals/Urinary Tract: Adrenal glands are unremarkable. Kidneys are normal, without renal calculi, focal lesion, or hydronephrosis. Bladder is unremarkable. 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 Moderate sigmoid diverticulosis. Stomach, small bowel, and large bowel are otherwise unremarkable. Mild ascites. No free intraperitoneal gas. Vascular/Lymphatic: Aortic atherosclerosis. No enlarged abdominal or pelvic lymph nodes. Reproductive: Status post hysterectomy. No adnexal masses. Other: No abdominal wall hernia Musculoskeletal: Status post left hip ORIF. Degenerative changes are seen within the lumbar spine. No acute bone abnormality. No lytic or blastic bone lesion. Review of the MIP images confirms the above findings. IMPRESSION: 1. Acute pulmonary embolus with moderate embolic burden. No CT evidence of right heart strain. 2. Extensive reticulonodular infiltrate throughout the lungs bilaterally with more focal consolidation within the right lower lobe, possibly related to extensive pneumonic consolidation with superimposed infiltrate related to pulmonary infarct. 3. Small right and trace left pleural effusions. 4. Moderate left anterior descending coronary artery calcification. Mild cardiomegaly. 5. Mild ascites. 6. Moderate sigmoid diverticulosis. 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 Aortic Atherosclerosis (ICD10-I70.0). Electronically Signed   By: Helyn Numbers M.D.   On: 01/19/2024 02:32   CT ABDOMEN PELVIS W CONTRAST Result Date: 01/19/2024 CLINICAL DATA:  Pulmonary embolism (PE) suspected, high prob; Abdominal pain, acute, nonlocalized. Neutropenia, dyspnea, cough, melena, elevated D-dimer EXAM: CT ANGIOGRAPHY CHEST CT ABDOMEN AND PELVIS WITH CONTRAST TECHNIQUE: Multidetector CT imaging of the chest was performed using the standard protocol during bolus administration of intravenous contrast. Multiplanar CT image reconstructions and MIPs  were obtained to evaluate the vascular anatomy. Multidetector CT imaging of the abdomen and pelvis was performed using the standard protocol during bolus administration of intravenous contrast. RADIATION DOSE REDUCTION: This exam was performed according to the departmental dose-optimization program which includes automated exposure control, adjustment of the mA and/or kV according to patient size and/or use of iterative reconstruction technique. CONTRAST:  OMNIPAQUE IOHEXOL 350 MG/ML SOLN COMPARISON:  None Available. FINDINGS: CTA CHEST FINDINGS Cardiovascular: Mood there is adequate opacification of the pulmonary arterial tree. There are multiple intraluminal filling defects identified within multiple segmental pulmonary arteries of the right upper lobe, right lower lobe left lobe keeping with changes of acute pulmonary embolus. The embolic burden is moderate. The central pulmonary arteries are enlarged in keeping with changes of pulmonary arterial hypertension. There is, however, no CT evidence of right heart strain. Moderate left anterior descending coronary artery calcification. Global cardiac size is mildly enlarged. No pericardial effusion. Mild atherosclerotic calcification within the thoracic aorta. No aortic aneurysm. Mediastinum/Nodes: Shotty mediastinal adenopathy may be reactive in nature. No frankly pathologic thoracic adenopathy. 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 Esophagus unremarkable. Lungs/Pleura: There is extensive reticulonodular infiltrate throughout the lungs bilaterally with more focal consolidation within right lower lobe, possibly related to extensive pneumonic consolidation with superimposed infiltrate related to pulmonary infarct. Small right and trace left pleural effusions are present. No pneumothorax. Musculoskeletal: Osseous structures are age-appropriate.  No acute bone abnormality. Review of the MIP images confirms the above findings. CT ABDOMEN and PELVIS FINDINGS  Hepatobiliary: Multiple cysts are seen scattered throughout the liver. Status post cholecystectomy. No intra or extrahepatic biliary ductal dilation. Pancreas: Unremarkable Spleen: Unremarkable Adrenals/Urinary Tract: Adrenal glands are unremarkable. Kidneys are normal, without renal calculi, focal lesion, or hydronephrosis. Bladder is unremarkable. 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 Moderate sigmoid diverticulosis. Stomach, small bowel, and large bowel are otherwise unremarkable. Mild ascites. No free intraperitoneal gas. Vascular/Lymphatic: Aortic atherosclerosis. No enlarged abdominal or pelvic lymph nodes. Reproductive: Status post hysterectomy. No adnexal masses. Other: No abdominal wall hernia Musculoskeletal: Status post left hip ORIF. Degenerative changes are seen within the lumbar spine. No acute bone abnormality. No lytic or blastic bone lesion. Review of the MIP images confirms the above findings. IMPRESSION: 1. Acute pulmonary embolus with moderate embolic burden. No CT evidence of right heart strain. 2. Extensive reticulonodular infiltrate throughout the lungs bilaterally with more focal consolidation within the right lower lobe, possibly related to extensive pneumonic consolidation with superimposed infiltrate related to pulmonary infarct. 3. Small right and trace left pleural effusions. 4. Moderate left anterior descending coronary artery calcification. Mild cardiomegaly. 5. Mild ascites. 6. Moderate sigmoid diverticulosis. 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 Aortic Atherosclerosis (ICD10-I70.0). Electronically Signed   By: Helyn Numbers M.D.   On: 01/19/2024 02:32   CT Head Wo Contrast Result Date: 01/19/2024 CLINICAL DATA:  Altered mental status EXAM: CT HEAD WITHOUT CONTRAST TECHNIQUE: Contiguous axial images were obtained from the base of the skull through the vertex without intravenous contrast. RADIATION DOSE REDUCTION: This exam was  performed according to the departmental dose-optimization program which includes automated exposure control, adjustment of the mA and/or kV according to patient size and/or use of iterative reconstruction technique. COMPARISON:  09/20/2005 FINDINGS: Brain: There is no mass, hemorrhage or extra-axial collection. There is generalized atrophy without lobar predilection. Hypodensity of the white matter is most commonly associated with chronic microvascular disease. Vascular: Atherosclerotic calcification of the internal carotid arteries at the skull base. No abnormal hyperdensity of the major intracranial arteries or dural venous sinuses. Skull: The visualized skull base, calvarium and extracranial soft tissues are normal. Sinuses/Orbits: No fluid levels or advanced mucosal thickening of the visualized paranasal sinuses. No mastoid or middle ear effusion. Normal orbits. Other: None. IMPRESSION: 1. No acute intracranial abnormality. 2. Generalized atrophy and findings of chronic microvascular disease. Electronically Signed   By: Deatra Robinson M.D.   On: 01/19/2024 02:01   DG Chest Port 1 View Result Date: 01/18/2024 CLINICAL DATA:  Shortness of breath. EXAM: PORTABLE CHEST 1 VIEW COMPARISON:  07/06/2023 FINDINGS: The heart is enlarged. Diffuse interstitial opacities throughout both lungs with more patchy opacity at the lung bases. No pneumothorax. There may be a right pleural effusion. IMPRESSION: Cardiomegaly with diffuse interstitial opacities and more patchy opacity at the lung bases, may represent pulmonary edema, multifocal pneumonia, or combination there. Possible right pleural effusion. Electronically Signed   By: Narda Rutherford M.D.   On: 01/18/2024 23:08        Scheduled Meds:  sodium chloride   Intravenous Once   sodium chloride   Intravenous Once   Chlorhexidine Gluconate Cloth  6 each Topical Nightly   folic acid  1 mg Oral TID   guaiFENesin  600 mg Oral BID   leptospermum manuka honey  1  Application Topical Daily   levothyroxine  100 mcg Oral Daily   methylPREDNISolone (SOLU-MEDROL) injection  40  mg Intravenous Q12H   pantoprazole (PROTONIX) IV  40 mg Intravenous Q12H   sodium chloride flush  3 mL Intravenous Q12H   sodium chloride flush  3 mL Intravenous Q12H   Continuous Infusions:  sodium chloride 75 mL/hr at 02/05/2024 0800   azithromycin 500 mg (01/29/2024 1003)   cefTRIAXone (ROCEPHIN)  IV     heparin 1,000 Units/hr (01/19/2024 0800)   magnesium sulfate bolus IVPB 2 g (02/06/2024 1005)   sodium PHOSPHATE IVPB (in mmol) 30 mmol (01/29/2024 1010)   vancomycin       LOS: 1 day    Critical Care Time Upon my evaluation, this patient had a high probability of imminent or life-threatening deterioration due to pneumonia, acute PE, acute DVT, staph bacteremia, pancytopenia with severe anemia/thrombocytopenia, which required my direct attention, intervention, and personal management.  I have personally provided 41 minutes of critical care time exclusive of my time spent on separately billable procedures.  Time includes review of laboratory data, radiology results, discussion with consultants, and monitoring for potential decompensation.       Alvira Philips [place], DO [facility] Available via Epic secure chat 7am-7pm After these hours, please refer to coverage provider listed on [email or web address] 01/08/2024, 10:36 AM

## 2024-02-07 NOTE — Progress Notes (Signed)
 VAST consulted to obtain IV access. Bilateral arms assessed utilizing ultrasound. No appropriate veins visualized or palpated for IV access. ICU RN and MD made aware.

## 2024-02-07 NOTE — Progress Notes (Signed)
 Pharmacy Antibiotic Note  [name] is a 78 y.o. female admitted on 01/18/2024 with pneumonia.  Pharmacy has been consulted for   Vancomycin dosing.  Pt had stopped vancomycin but due to potentially positive blood cultures of 3/13 and as pt is immunocompromised, resuming vancomycin   Plan: Ceftriaxone 2 gr IV q24h per MD Azithromycin 500 mg IV q24h per MD  Vancomycin 1000mg  IV q24h to target AUC 400-550.  Estimated AUC 469 ( Scr rounded to 0.8 wt 58.7 kg)     Height: 5\' 2"  (157.5 cm) Weight: 58.7 kg (129 lb 6.6 oz) IBW/kg (Calculated) : 50.1  Temp (24hrs), Avg:98.3 F (36.8 C), Min:98 F (36.7 C), Max:98.5 F (36.9 C)  Recent Labs  Lab 01/18/24 2228 01/19/24 0034 01/19/24 0651 01/19/24 0835 01/24/2024 0056 01/10/2024 0705  WBC 1.1*  --   --  0.7* 1.0* 0.9*  CREATININE 0.79  --  0.76  --  0.69  --   LATICACIDVEN  --  1.7  --   --   --   --     Estimated Creatinine Clearance: 47.3 mL/min (by C-G formula based on SCr of 0.69 mg/dL).    Allergies  Allergen Reactions   Tape Other (See Comments)    Paper tape ONLY, please!! Not allergic, sensitive   Ace Inhibitors Cough   Ambien [Zolpidem] Other (See Comments)    "Odd feeling"   Crestor [Rosuvastatin] Other (See Comments)    Sore muscles   Leflunomide Other (See Comments)    Elevated LFTs   Pravastatin Other (See Comments)    Sore muscles   Prozac [Fluoxetine Hcl] Other (See Comments)    Decreased libido   Zoloft [Sertraline Hcl] Other (See Comments)    Reaction not recalled    Antimicrobials this admission:  3/13 Cefepime >> 3/13 3/13 Vancomycin >> 3/13, 3/14 >>  3/13 Ceftriaxone >>  3/13 Azithromycin >> 3/17  Dose adjustments this admission:  Microbiology results: 3/13 BCx: 2/4 staph species  3/12 Resp PCR: negative  3/13 RVP: negative  3/13 MRSA PCR: negative    Adalberto Cole, PharmD, BCPS 01/23/2024 8:56 AM

## 2024-02-07 NOTE — TOC Initial Note (Signed)
 Transition of Care [facility]) - Initial/Assessment Note   Patient Details  Name: [name] MRN: [id number] Date of Birth: 1946-03-14  Transition [facility]) CM/SW Contact:    Ewing Schlein, LCSW Phone Number: 01/08/2024, 10:12 AM  Clinical Narrative: Patient is from home with spouse. Patient is currently on IV antibiotics and 45 HHFNC. TOC following for discharge needs.                Expected Discharge Plan:  (TBD) Barriers to Discharge: Continued Medical Work up  Expected Discharge Plan and Services Living arrangements for the past 2 months: Single Family Home  Prior Living Arrangements/Services Living arrangements for the past 2 months: Single Family Home Lives with:: Spouse Patient language and need for interpreter reviewed:: Yes Do you feel safe going back to the place where you live?: Yes      Need for Family Participation in Patient Care: Yes (Comment) Care giver support system in place?: Yes (comment) Criminal Activity/Legal Involvement Pertinent to Current Situation/Hospitalization: No - Comment as needed  Activities of Daily Living ADL Screening (condition at time of admission) Independently performs ADLs?: No  Emotional Assessment Alcohol / Substance Use: Not Applicable Psych Involvement: No (comment)  Admission diagnosis:  Thrombocytopenia (HCC) [D69.6] Sepsis due to pneumonia (HCC) [J18.9, A41.9] Sepsis with encephalopathy without septic shock, due to unspecified organism (HCC) [A41.9, R65.20, G93.41] Patient Active Problem List   Diagnosis Date Noted   Pancytopenia (HCC) 01/19/2024   Sepsis due to pneumonia (HCC) 01/19/2024   Elevated troponin 01/19/2024   Consumptive coagulopathy (HCC) 01/19/2024   Hypokalemia 01/19/2024   Acute pulmonary embolism (HCC) 01/19/2024   Osteoarthritis, knee 09/14/2023   Acute on chronic anemia 07/17/2023   Thrombocytopenia (HCC) 07/17/2023   Acute metabolic encephalopathy 07/17/2023   Closed left hip fracture (HCC)  07/15/2023   Degenerative lumbar spinal stenosis 07/15/2023   Statin myopathy 01/06/2022   COVID-19 06/05/2021   Hyponatremia 06/05/2021   COVID-19 virus infection 06/05/2021   Sensorineural hearing loss (SNHL) of both ears 03/30/2021   Depression, major, single episode, 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   Aortic atherosclerosis (HCC) by CXR in 2021 11/22/2019   Neuropathy 08/15/2018   Insomnia 02/06/2018   Lumbar polyradiculopathy 01/18/2018   Overweight (BMI 25.0-29.9) 09/17/2015   Hyperparathyroidism (HCC) 07/25/2015   S/P shoulder replacement, right 03/07/2014   Hypothyroidism 02/11/2014   Osteoporosis 02/11/2014   Vitamin D deficiency 01/22/2014   Abnormal glucose 01/22/2014   Medication management 01/22/2014   History of colonic polyps 04/18/2013   Gastroesophageal reflux disease without esophagitis 02/28/2013   Hyperlipidemia 10/07/2011   Essential hypertension 10/21/2008   Rheumatoid arthritis (HCC) 10/21/2008   PCP:  Lucky Cowboy, MD Pharmacy:   [facility] [id number] - [address] [place] - [address] [facility] [address] [place] [address] Phone: [phone] Fax: [phone]  Social Drivers of Health (SDOH) Social History: SDOH Screenings   Food Insecurity: No Food Insecurity (01/19/2024)  Housing: Low Risk  (01/09/2024)  Transportation Needs: No Transportation Needs (01/19/2024)  Utilities: Not At Risk (01/19/2024)  Depression (PHQ2-9): Low Risk  (10/27/2023)  Tobacco Use: Medium Risk (01/19/2024)   SDOH Interventions:    Readmission Risk Interventions     No data to display

## 2024-02-07 NOTE — Progress Notes (Signed)
 Rt placed pt on HHF [place] due to low sats and WOB.

## 2024-02-07 DEATH — deceased

## 2024-04-23 ENCOUNTER — Ambulatory Visit: Payer: Medicare PPO | Admitting: Nurse Practitioner

## 2024-05-03 ENCOUNTER — Ambulatory Visit: Payer: Medicare PPO | Admitting: Nurse Practitioner

## 2024-07-31 ENCOUNTER — Encounter: Payer: Medicare PPO | Admitting: Internal Medicine

## 2024-08-07 ENCOUNTER — Encounter: Payer: Medicare PPO | Admitting: Internal Medicine

## 2024-10-30 ENCOUNTER — Ambulatory Visit: Payer: Medicare PPO | Admitting: Nurse Practitioner

## 2024-10-31 ENCOUNTER — Other Ambulatory Visit (HOSPITAL_COMMUNITY): Payer: Self-pay
